# Patient Record
Sex: Female | Born: 1955
Health system: Southern US, Community
[De-identification: ages and names within clinical notes are randomized; demographics above are authoritative.]

## PROBLEM LIST (undated history)

## (undated) DIAGNOSIS — E119 Type 2 diabetes mellitus without complications: Secondary | ICD-10-CM

## (undated) DIAGNOSIS — D631 Anemia in chronic kidney disease: Secondary | ICD-10-CM

## (undated) DIAGNOSIS — I10 Essential (primary) hypertension: Secondary | ICD-10-CM

## (undated) DIAGNOSIS — N189 Chronic kidney disease, unspecified: Secondary | ICD-10-CM

## (undated) DIAGNOSIS — E1140 Type 2 diabetes mellitus with diabetic neuropathy, unspecified: Secondary | ICD-10-CM

## (undated) DIAGNOSIS — G9340 Encephalopathy, unspecified: Secondary | ICD-10-CM

## (undated) DIAGNOSIS — R569 Unspecified convulsions: Secondary | ICD-10-CM

## (undated) DIAGNOSIS — F39 Unspecified mood [affective] disorder: Secondary | ICD-10-CM

## (undated) DIAGNOSIS — J189 Pneumonia, unspecified organism: Secondary | ICD-10-CM

## (undated) DIAGNOSIS — E785 Hyperlipidemia, unspecified: Secondary | ICD-10-CM

## (undated) DIAGNOSIS — N184 Chronic kidney disease, stage 4 (severe): Secondary | ICD-10-CM

## (undated) HISTORY — PX: ABDOMINAL HYSTERECTOMY: SHX81

## (undated) HISTORY — DX: Chronic kidney disease, unspecified: N18.9

## (undated) HISTORY — PX: TUBAL LIGATION: SHX77

## (undated) HISTORY — DX: Encephalopathy, unspecified: G93.40

## (undated) HISTORY — PX: EYE SURGERY: SHX253

---

## 2011-11-21 DIAGNOSIS — R799 Abnormal finding of blood chemistry, unspecified: Secondary | ICD-10-CM | POA: Diagnosis not present

## 2011-11-21 DIAGNOSIS — D649 Anemia, unspecified: Secondary | ICD-10-CM | POA: Diagnosis not present

## 2011-11-21 DIAGNOSIS — R7402 Elevation of levels of lactic acid dehydrogenase (LDH): Secondary | ICD-10-CM | POA: Diagnosis not present

## 2011-11-21 DIAGNOSIS — R7401 Elevation of levels of liver transaminase levels: Secondary | ICD-10-CM | POA: Diagnosis not present

## 2011-11-21 DIAGNOSIS — R748 Abnormal levels of other serum enzymes: Secondary | ICD-10-CM | POA: Diagnosis not present

## 2011-11-26 DIAGNOSIS — Z23 Encounter for immunization: Secondary | ICD-10-CM | POA: Diagnosis not present

## 2011-12-25 DIAGNOSIS — E109 Type 1 diabetes mellitus without complications: Secondary | ICD-10-CM | POA: Diagnosis not present

## 2012-01-16 DIAGNOSIS — R748 Abnormal levels of other serum enzymes: Secondary | ICD-10-CM | POA: Diagnosis not present

## 2012-01-16 DIAGNOSIS — D649 Anemia, unspecified: Secondary | ICD-10-CM | POA: Diagnosis not present

## 2012-01-16 DIAGNOSIS — E8809 Other disorders of plasma-protein metabolism, not elsewhere classified: Secondary | ICD-10-CM | POA: Diagnosis not present

## 2012-01-16 DIAGNOSIS — I10 Essential (primary) hypertension: Secondary | ICD-10-CM | POA: Diagnosis not present

## 2012-01-21 DIAGNOSIS — E7800 Pure hypercholesterolemia, unspecified: Secondary | ICD-10-CM | POA: Diagnosis not present

## 2012-01-21 DIAGNOSIS — E569 Vitamin deficiency, unspecified: Secondary | ICD-10-CM | POA: Diagnosis not present

## 2012-01-21 DIAGNOSIS — IMO0001 Reserved for inherently not codable concepts without codable children: Secondary | ICD-10-CM | POA: Diagnosis not present

## 2012-01-21 DIAGNOSIS — R300 Dysuria: Secondary | ICD-10-CM | POA: Diagnosis not present

## 2012-01-21 DIAGNOSIS — I10 Essential (primary) hypertension: Secondary | ICD-10-CM | POA: Diagnosis not present

## 2012-01-27 DIAGNOSIS — E7800 Pure hypercholesterolemia, unspecified: Secondary | ICD-10-CM | POA: Diagnosis not present

## 2012-01-27 DIAGNOSIS — I10 Essential (primary) hypertension: Secondary | ICD-10-CM | POA: Diagnosis not present

## 2012-01-27 DIAGNOSIS — E569 Vitamin deficiency, unspecified: Secondary | ICD-10-CM | POA: Diagnosis not present

## 2012-01-27 DIAGNOSIS — IMO0001 Reserved for inherently not codable concepts without codable children: Secondary | ICD-10-CM | POA: Diagnosis not present

## 2012-01-27 DIAGNOSIS — M889 Osteitis deformans of unspecified bone: Secondary | ICD-10-CM | POA: Diagnosis not present

## 2012-01-27 DIAGNOSIS — M899 Disorder of bone, unspecified: Secondary | ICD-10-CM | POA: Diagnosis not present

## 2012-01-27 DIAGNOSIS — M949 Disorder of cartilage, unspecified: Secondary | ICD-10-CM | POA: Diagnosis not present

## 2012-02-17 DIAGNOSIS — IMO0001 Reserved for inherently not codable concepts without codable children: Secondary | ICD-10-CM | POA: Diagnosis not present

## 2012-02-17 DIAGNOSIS — I10 Essential (primary) hypertension: Secondary | ICD-10-CM | POA: Diagnosis not present

## 2012-02-17 DIAGNOSIS — E7800 Pure hypercholesterolemia, unspecified: Secondary | ICD-10-CM | POA: Diagnosis not present

## 2012-02-17 DIAGNOSIS — E569 Vitamin deficiency, unspecified: Secondary | ICD-10-CM | POA: Diagnosis not present

## 2012-03-01 DIAGNOSIS — D7589 Other specified diseases of blood and blood-forming organs: Secondary | ICD-10-CM | POA: Diagnosis not present

## 2012-03-01 DIAGNOSIS — D472 Monoclonal gammopathy: Secondary | ICD-10-CM | POA: Diagnosis not present

## 2012-05-06 DIAGNOSIS — D7589 Other specified diseases of blood and blood-forming organs: Secondary | ICD-10-CM | POA: Diagnosis not present

## 2012-08-31 DIAGNOSIS — R0602 Shortness of breath: Secondary | ICD-10-CM | POA: Diagnosis not present

## 2012-08-31 DIAGNOSIS — I10 Essential (primary) hypertension: Secondary | ICD-10-CM | POA: Diagnosis not present

## 2012-08-31 DIAGNOSIS — E109 Type 1 diabetes mellitus without complications: Secondary | ICD-10-CM | POA: Diagnosis not present

## 2012-11-23 DIAGNOSIS — D7589 Other specified diseases of blood and blood-forming organs: Secondary | ICD-10-CM | POA: Diagnosis not present

## 2012-11-23 DIAGNOSIS — D649 Anemia, unspecified: Secondary | ICD-10-CM | POA: Diagnosis not present

## 2012-11-26 DIAGNOSIS — I10 Essential (primary) hypertension: Secondary | ICD-10-CM | POA: Diagnosis not present

## 2012-11-26 DIAGNOSIS — I059 Rheumatic mitral valve disease, unspecified: Secondary | ICD-10-CM | POA: Diagnosis not present

## 2012-11-26 DIAGNOSIS — I379 Nonrheumatic pulmonary valve disorder, unspecified: Secondary | ICD-10-CM | POA: Diagnosis not present

## 2012-11-26 DIAGNOSIS — R079 Chest pain, unspecified: Secondary | ICD-10-CM | POA: Diagnosis not present

## 2012-11-26 DIAGNOSIS — R002 Palpitations: Secondary | ICD-10-CM | POA: Diagnosis not present

## 2012-11-26 DIAGNOSIS — E7800 Pure hypercholesterolemia, unspecified: Secondary | ICD-10-CM | POA: Diagnosis not present

## 2012-11-26 DIAGNOSIS — I369 Nonrheumatic tricuspid valve disorder, unspecified: Secondary | ICD-10-CM | POA: Diagnosis not present

## 2013-03-04 DIAGNOSIS — Z794 Long term (current) use of insulin: Secondary | ICD-10-CM | POA: Diagnosis not present

## 2013-03-04 DIAGNOSIS — E1069 Type 1 diabetes mellitus with other specified complication: Secondary | ICD-10-CM | POA: Diagnosis not present

## 2013-03-04 DIAGNOSIS — I10 Essential (primary) hypertension: Secondary | ICD-10-CM | POA: Diagnosis not present

## 2013-03-04 DIAGNOSIS — R5383 Other fatigue: Secondary | ICD-10-CM | POA: Diagnosis not present

## 2013-03-04 DIAGNOSIS — R4182 Altered mental status, unspecified: Secondary | ICD-10-CM | POA: Diagnosis not present

## 2013-03-04 DIAGNOSIS — R5381 Other malaise: Secondary | ICD-10-CM | POA: Diagnosis not present

## 2013-03-10 DIAGNOSIS — R51 Headache: Secondary | ICD-10-CM | POA: Diagnosis not present

## 2013-04-13 DIAGNOSIS — E1139 Type 2 diabetes mellitus with other diabetic ophthalmic complication: Secondary | ICD-10-CM | POA: Diagnosis not present

## 2013-04-13 DIAGNOSIS — H25019 Cortical age-related cataract, unspecified eye: Secondary | ICD-10-CM | POA: Diagnosis not present

## 2013-04-15 DIAGNOSIS — E109 Type 1 diabetes mellitus without complications: Secondary | ICD-10-CM | POA: Diagnosis not present

## 2013-04-15 DIAGNOSIS — IMO0001 Reserved for inherently not codable concepts without codable children: Secondary | ICD-10-CM | POA: Diagnosis not present

## 2013-04-15 DIAGNOSIS — B9789 Other viral agents as the cause of diseases classified elsewhere: Secondary | ICD-10-CM | POA: Diagnosis not present

## 2013-04-15 DIAGNOSIS — R509 Fever, unspecified: Secondary | ICD-10-CM | POA: Diagnosis not present

## 2013-04-15 DIAGNOSIS — R6883 Chills (without fever): Secondary | ICD-10-CM | POA: Diagnosis not present

## 2013-05-10 DIAGNOSIS — K5900 Constipation, unspecified: Secondary | ICD-10-CM | POA: Diagnosis not present

## 2013-05-10 DIAGNOSIS — I10 Essential (primary) hypertension: Secondary | ICD-10-CM | POA: Diagnosis not present

## 2013-05-10 DIAGNOSIS — E559 Vitamin D deficiency, unspecified: Secondary | ICD-10-CM | POA: Diagnosis not present

## 2013-05-10 DIAGNOSIS — E119 Type 2 diabetes mellitus without complications: Secondary | ICD-10-CM | POA: Diagnosis not present

## 2013-05-11 DIAGNOSIS — E559 Vitamin D deficiency, unspecified: Secondary | ICD-10-CM | POA: Diagnosis not present

## 2013-05-11 DIAGNOSIS — E119 Type 2 diabetes mellitus without complications: Secondary | ICD-10-CM | POA: Diagnosis not present

## 2013-05-11 DIAGNOSIS — I10 Essential (primary) hypertension: Secondary | ICD-10-CM | POA: Diagnosis not present

## 2013-05-11 DIAGNOSIS — E785 Hyperlipidemia, unspecified: Secondary | ICD-10-CM | POA: Diagnosis not present

## 2013-05-25 DIAGNOSIS — E109 Type 1 diabetes mellitus without complications: Secondary | ICD-10-CM | POA: Diagnosis not present

## 2013-05-25 DIAGNOSIS — H0520 Unspecified exophthalmos: Secondary | ICD-10-CM | POA: Diagnosis not present

## 2013-05-25 DIAGNOSIS — H25049 Posterior subcapsular polar age-related cataract, unspecified eye: Secondary | ICD-10-CM | POA: Diagnosis not present

## 2013-05-25 DIAGNOSIS — H2510 Age-related nuclear cataract, unspecified eye: Secondary | ICD-10-CM | POA: Diagnosis not present

## 2013-05-31 DIAGNOSIS — M889 Osteitis deformans of unspecified bone: Secondary | ICD-10-CM | POA: Diagnosis not present

## 2013-06-02 DIAGNOSIS — E559 Vitamin D deficiency, unspecified: Secondary | ICD-10-CM | POA: Diagnosis not present

## 2013-06-02 DIAGNOSIS — I10 Essential (primary) hypertension: Secondary | ICD-10-CM | POA: Diagnosis not present

## 2013-06-02 DIAGNOSIS — E7800 Pure hypercholesterolemia, unspecified: Secondary | ICD-10-CM | POA: Diagnosis not present

## 2013-06-02 DIAGNOSIS — E119 Type 2 diabetes mellitus without complications: Secondary | ICD-10-CM | POA: Diagnosis not present

## 2013-06-14 DIAGNOSIS — I10 Essential (primary) hypertension: Secondary | ICD-10-CM | POA: Diagnosis not present

## 2013-06-14 DIAGNOSIS — Z01811 Encounter for preprocedural respiratory examination: Secondary | ICD-10-CM | POA: Diagnosis not present

## 2013-06-14 DIAGNOSIS — H0520 Unspecified exophthalmos: Secondary | ICD-10-CM | POA: Diagnosis not present

## 2013-06-14 DIAGNOSIS — E119 Type 2 diabetes mellitus without complications: Secondary | ICD-10-CM | POA: Diagnosis not present

## 2013-06-14 DIAGNOSIS — E109 Type 1 diabetes mellitus without complications: Secondary | ICD-10-CM | POA: Diagnosis not present

## 2013-06-14 DIAGNOSIS — H2589 Other age-related cataract: Secondary | ICD-10-CM | POA: Diagnosis not present

## 2013-06-14 DIAGNOSIS — H2510 Age-related nuclear cataract, unspecified eye: Secondary | ICD-10-CM | POA: Diagnosis not present

## 2013-06-14 DIAGNOSIS — H25049 Posterior subcapsular polar age-related cataract, unspecified eye: Secondary | ICD-10-CM | POA: Diagnosis not present

## 2013-06-15 DIAGNOSIS — R6883 Chills (without fever): Secondary | ICD-10-CM | POA: Diagnosis not present

## 2013-06-15 DIAGNOSIS — E162 Hypoglycemia, unspecified: Secondary | ICD-10-CM | POA: Diagnosis not present

## 2013-06-15 DIAGNOSIS — E1069 Type 1 diabetes mellitus with other specified complication: Secondary | ICD-10-CM | POA: Diagnosis not present

## 2013-06-21 DIAGNOSIS — H2589 Other age-related cataract: Secondary | ICD-10-CM | POA: Diagnosis not present

## 2013-06-21 DIAGNOSIS — E119 Type 2 diabetes mellitus without complications: Secondary | ICD-10-CM | POA: Diagnosis not present

## 2013-06-21 DIAGNOSIS — H2510 Age-related nuclear cataract, unspecified eye: Secondary | ICD-10-CM | POA: Diagnosis not present

## 2013-06-21 DIAGNOSIS — I10 Essential (primary) hypertension: Secondary | ICD-10-CM | POA: Diagnosis not present

## 2013-07-12 DIAGNOSIS — I10 Essential (primary) hypertension: Secondary | ICD-10-CM | POA: Diagnosis not present

## 2013-07-12 DIAGNOSIS — H2589 Other age-related cataract: Secondary | ICD-10-CM | POA: Diagnosis not present

## 2013-07-12 DIAGNOSIS — E119 Type 2 diabetes mellitus without complications: Secondary | ICD-10-CM | POA: Diagnosis not present

## 2013-08-30 DIAGNOSIS — R109 Unspecified abdominal pain: Secondary | ICD-10-CM | POA: Diagnosis not present

## 2013-08-30 DIAGNOSIS — E1010 Type 1 diabetes mellitus with ketoacidosis without coma: Secondary | ICD-10-CM | POA: Diagnosis not present

## 2013-08-30 DIAGNOSIS — R630 Anorexia: Secondary | ICD-10-CM | POA: Diagnosis not present

## 2013-08-30 DIAGNOSIS — Z91199 Patient's noncompliance with other medical treatment and regimen due to unspecified reason: Secondary | ICD-10-CM | POA: Diagnosis not present

## 2013-08-30 DIAGNOSIS — K2940 Chronic atrophic gastritis without bleeding: Secondary | ICD-10-CM | POA: Diagnosis present

## 2013-08-30 DIAGNOSIS — E875 Hyperkalemia: Secondary | ICD-10-CM | POA: Diagnosis not present

## 2013-08-30 DIAGNOSIS — E785 Hyperlipidemia, unspecified: Secondary | ICD-10-CM | POA: Diagnosis not present

## 2013-08-30 DIAGNOSIS — R944 Abnormal results of kidney function studies: Secondary | ICD-10-CM | POA: Diagnosis not present

## 2013-08-30 DIAGNOSIS — R1084 Generalized abdominal pain: Secondary | ICD-10-CM | POA: Diagnosis not present

## 2013-08-30 DIAGNOSIS — R748 Abnormal levels of other serum enzymes: Secondary | ICD-10-CM | POA: Diagnosis present

## 2013-08-30 DIAGNOSIS — M889 Osteitis deformans of unspecified bone: Secondary | ICD-10-CM | POA: Diagnosis present

## 2013-08-30 DIAGNOSIS — K5900 Constipation, unspecified: Secondary | ICD-10-CM | POA: Diagnosis not present

## 2013-08-30 DIAGNOSIS — R0609 Other forms of dyspnea: Secondary | ICD-10-CM | POA: Diagnosis not present

## 2013-08-30 DIAGNOSIS — E860 Dehydration: Secondary | ICD-10-CM | POA: Diagnosis not present

## 2013-08-30 DIAGNOSIS — Z9119 Patient's noncompliance with other medical treatment and regimen: Secondary | ICD-10-CM | POA: Diagnosis not present

## 2013-08-30 DIAGNOSIS — I10 Essential (primary) hypertension: Secondary | ICD-10-CM | POA: Diagnosis not present

## 2013-08-30 DIAGNOSIS — K219 Gastro-esophageal reflux disease without esophagitis: Secondary | ICD-10-CM | POA: Diagnosis not present

## 2013-09-06 DIAGNOSIS — Z23 Encounter for immunization: Secondary | ICD-10-CM | POA: Diagnosis not present

## 2013-09-06 DIAGNOSIS — I10 Essential (primary) hypertension: Secondary | ICD-10-CM | POA: Diagnosis not present

## 2013-09-06 DIAGNOSIS — E7800 Pure hypercholesterolemia, unspecified: Secondary | ICD-10-CM | POA: Diagnosis not present

## 2013-09-06 DIAGNOSIS — E119 Type 2 diabetes mellitus without complications: Secondary | ICD-10-CM | POA: Diagnosis not present

## 2013-09-06 DIAGNOSIS — E559 Vitamin D deficiency, unspecified: Secondary | ICD-10-CM | POA: Diagnosis not present

## 2013-09-15 DIAGNOSIS — G589 Mononeuropathy, unspecified: Secondary | ICD-10-CM | POA: Diagnosis not present

## 2013-10-18 DIAGNOSIS — E7800 Pure hypercholesterolemia, unspecified: Secondary | ICD-10-CM | POA: Diagnosis not present

## 2013-10-18 DIAGNOSIS — I10 Essential (primary) hypertension: Secondary | ICD-10-CM | POA: Diagnosis not present

## 2013-10-18 DIAGNOSIS — G589 Mononeuropathy, unspecified: Secondary | ICD-10-CM | POA: Diagnosis not present

## 2013-10-18 DIAGNOSIS — E119 Type 2 diabetes mellitus without complications: Secondary | ICD-10-CM | POA: Diagnosis not present

## 2014-01-06 DIAGNOSIS — R0602 Shortness of breath: Secondary | ICD-10-CM | POA: Diagnosis not present

## 2014-01-06 DIAGNOSIS — I10 Essential (primary) hypertension: Secondary | ICD-10-CM | POA: Diagnosis not present

## 2014-01-06 DIAGNOSIS — Z794 Long term (current) use of insulin: Secondary | ICD-10-CM | POA: Diagnosis not present

## 2014-01-06 DIAGNOSIS — E109 Type 1 diabetes mellitus without complications: Secondary | ICD-10-CM | POA: Diagnosis not present

## 2014-01-06 DIAGNOSIS — R110 Nausea: Secondary | ICD-10-CM | POA: Diagnosis not present

## 2014-01-06 DIAGNOSIS — R350 Frequency of micturition: Secondary | ICD-10-CM | POA: Diagnosis not present

## 2014-01-10 DIAGNOSIS — E119 Type 2 diabetes mellitus without complications: Secondary | ICD-10-CM | POA: Diagnosis not present

## 2014-01-16 DIAGNOSIS — G589 Mononeuropathy, unspecified: Secondary | ICD-10-CM | POA: Diagnosis not present

## 2014-01-16 DIAGNOSIS — E119 Type 2 diabetes mellitus without complications: Secondary | ICD-10-CM | POA: Diagnosis not present

## 2014-01-16 DIAGNOSIS — E7800 Pure hypercholesterolemia, unspecified: Secondary | ICD-10-CM | POA: Diagnosis not present

## 2014-01-16 DIAGNOSIS — I10 Essential (primary) hypertension: Secondary | ICD-10-CM | POA: Diagnosis not present

## 2014-03-04 DIAGNOSIS — I10 Essential (primary) hypertension: Secondary | ICD-10-CM | POA: Diagnosis not present

## 2014-03-04 DIAGNOSIS — M5430 Sciatica, unspecified side: Secondary | ICD-10-CM | POA: Diagnosis not present

## 2014-03-04 DIAGNOSIS — M5380 Other specified dorsopathies, site unspecified: Secondary | ICD-10-CM | POA: Diagnosis not present

## 2014-03-04 DIAGNOSIS — E109 Type 1 diabetes mellitus without complications: Secondary | ICD-10-CM | POA: Diagnosis not present

## 2014-04-18 DIAGNOSIS — E119 Type 2 diabetes mellitus without complications: Secondary | ICD-10-CM | POA: Diagnosis not present

## 2014-04-25 DIAGNOSIS — E7800 Pure hypercholesterolemia, unspecified: Secondary | ICD-10-CM | POA: Diagnosis not present

## 2014-04-25 DIAGNOSIS — Z79899 Other long term (current) drug therapy: Secondary | ICD-10-CM | POA: Diagnosis not present

## 2014-04-25 DIAGNOSIS — I10 Essential (primary) hypertension: Secondary | ICD-10-CM | POA: Diagnosis not present

## 2014-04-25 DIAGNOSIS — E119 Type 2 diabetes mellitus without complications: Secondary | ICD-10-CM | POA: Diagnosis not present

## 2014-04-25 DIAGNOSIS — G589 Mononeuropathy, unspecified: Secondary | ICD-10-CM | POA: Diagnosis not present

## 2014-04-26 DIAGNOSIS — E119 Type 2 diabetes mellitus without complications: Secondary | ICD-10-CM | POA: Diagnosis not present

## 2014-05-31 DIAGNOSIS — R3589 Other polyuria: Secondary | ICD-10-CM | POA: Diagnosis not present

## 2014-05-31 DIAGNOSIS — R358 Other polyuria: Secondary | ICD-10-CM | POA: Diagnosis not present

## 2014-05-31 DIAGNOSIS — R631 Polydipsia: Secondary | ICD-10-CM | POA: Diagnosis not present

## 2014-05-31 DIAGNOSIS — I10 Essential (primary) hypertension: Secondary | ICD-10-CM | POA: Diagnosis not present

## 2014-05-31 DIAGNOSIS — E119 Type 2 diabetes mellitus without complications: Secondary | ICD-10-CM | POA: Diagnosis not present

## 2014-06-12 DIAGNOSIS — D472 Monoclonal gammopathy: Secondary | ICD-10-CM | POA: Diagnosis not present

## 2014-06-12 DIAGNOSIS — D649 Anemia, unspecified: Secondary | ICD-10-CM | POA: Diagnosis not present

## 2014-10-06 DIAGNOSIS — Z23 Encounter for immunization: Secondary | ICD-10-CM | POA: Diagnosis not present

## 2014-10-06 DIAGNOSIS — F324 Major depressive disorder, single episode, in partial remission: Secondary | ICD-10-CM | POA: Diagnosis not present

## 2014-10-06 DIAGNOSIS — E559 Vitamin D deficiency, unspecified: Secondary | ICD-10-CM | POA: Diagnosis not present

## 2014-10-06 DIAGNOSIS — Z79899 Other long term (current) drug therapy: Secondary | ICD-10-CM | POA: Diagnosis not present

## 2014-10-06 DIAGNOSIS — E1140 Type 2 diabetes mellitus with diabetic neuropathy, unspecified: Secondary | ICD-10-CM | POA: Diagnosis not present

## 2014-10-06 DIAGNOSIS — I10 Essential (primary) hypertension: Secondary | ICD-10-CM | POA: Diagnosis not present

## 2014-10-06 DIAGNOSIS — E1142 Type 2 diabetes mellitus with diabetic polyneuropathy: Secondary | ICD-10-CM | POA: Diagnosis not present

## 2014-10-17 ENCOUNTER — Encounter (HOSPITAL_COMMUNITY): Payer: Self-pay | Admitting: Emergency Medicine

## 2014-10-17 ENCOUNTER — Emergency Department (HOSPITAL_COMMUNITY): Payer: Medicare Other

## 2014-10-17 ENCOUNTER — Emergency Department (HOSPITAL_COMMUNITY)
Admission: EM | Admit: 2014-10-17 | Discharge: 2014-10-18 | Disposition: A | Discharge status: Home / Self Care | Payer: Medicare Other | Attending: Emergency Medicine | Admitting: Emergency Medicine

## 2014-10-17 DIAGNOSIS — K2970 Gastritis, unspecified, without bleeding: Secondary | ICD-10-CM | POA: Insufficient documentation

## 2014-10-17 DIAGNOSIS — R1031 Right lower quadrant pain: Secondary | ICD-10-CM

## 2014-10-17 DIAGNOSIS — I10 Essential (primary) hypertension: Secondary | ICD-10-CM | POA: Diagnosis not present

## 2014-10-17 DIAGNOSIS — Z87891 Personal history of nicotine dependence: Secondary | ICD-10-CM | POA: Insufficient documentation

## 2014-10-17 DIAGNOSIS — E119 Type 2 diabetes mellitus without complications: Secondary | ICD-10-CM | POA: Insufficient documentation

## 2014-10-17 DIAGNOSIS — K2900 Acute gastritis without bleeding: Secondary | ICD-10-CM | POA: Diagnosis not present

## 2014-10-17 DIAGNOSIS — R110 Nausea: Secondary | ICD-10-CM | POA: Diagnosis not present

## 2014-10-17 DIAGNOSIS — R000 Tachycardia, unspecified: Secondary | ICD-10-CM | POA: Insufficient documentation

## 2014-10-17 HISTORY — DX: Essential (primary) hypertension: I10

## 2014-10-17 HISTORY — DX: Type 2 diabetes mellitus without complications: E11.9

## 2014-10-17 LAB — COMPREHENSIVE METABOLIC PANEL
ALT: 10 U/L (ref 0–35)
AST: 21 U/L (ref 0–37)
Albumin: 4.5 g/dL (ref 3.5–5.2)
Alkaline Phosphatase: 197 U/L — ABNORMAL HIGH (ref 39–117)
Anion gap: 19 — ABNORMAL HIGH (ref 5–15)
BUN: 14 mg/dL (ref 6–23)
CO2: 22 mEq/L (ref 19–32)
Calcium: 10.5 mg/dL (ref 8.4–10.5)
Chloride: 99 mEq/L (ref 96–112)
Creatinine, Ser: 0.88 mg/dL (ref 0.50–1.10)
GFR calc Af Amer: 82 mL/min — ABNORMAL LOW (ref >90)
GFR calc non Af Amer: 71 mL/min — ABNORMAL LOW (ref >90)
Glucose, Bld: 373 mg/dL — ABNORMAL HIGH (ref 70–99)
Potassium: 4.4 mEq/L (ref 3.7–5.3)
Sodium: 140 mEq/L (ref 137–147)
Total Bilirubin: 0.3 mg/dL (ref 0.3–1.2)
Total Protein: 9.4 g/dL — ABNORMAL HIGH (ref 6.0–8.3)

## 2014-10-17 LAB — URINALYSIS, ROUTINE W REFLEX MICROSCOPIC
Bilirubin Urine: NEGATIVE
Glucose, UA: >1000 mg/dL — AB
Hgb urine dipstick: NEGATIVE
Ketones, ur: 15 mg/dL — AB
Leukocytes, UA: NEGATIVE
Nitrite: NEGATIVE
Protein, ur: NEGATIVE mg/dL
Specific Gravity, Urine: 1.038 — ABNORMAL HIGH (ref 1.005–1.030)
Urobilinogen, UA: 0.2 mg/dL (ref 0.0–1.0)
pH: 5 (ref 5.0–8.0)

## 2014-10-17 LAB — CBC WITH DIFFERENTIAL/PLATELET
Basophils Absolute: 0 10*3/uL (ref 0.0–0.1)
Basophils Relative: 0 % (ref 0–1)
Eosinophils Absolute: 0 10*3/uL (ref 0.0–0.7)
Eosinophils Relative: 0 % (ref 0–5)
HCT: 36.6 % (ref 36.0–46.0)
Hemoglobin: 12.8 g/dL (ref 12.0–15.0)
Lymphocytes Relative: 46 % (ref 12–46)
Lymphs Abs: 2.6 10*3/uL (ref 0.7–4.0)
MCH: 29.1 pg (ref 26.0–34.0)
MCHC: 35 g/dL (ref 30.0–36.0)
MCV: 83.2 fL (ref 78.0–100.0)
Monocytes Absolute: 0.4 10*3/uL (ref 0.1–1.0)
Monocytes Relative: 7 % (ref 3–12)
Neutro Abs: 2.7 10*3/uL (ref 1.7–7.7)
Neutrophils Relative %: 47 % (ref 43–77)
Platelets: 299 10*3/uL (ref 150–400)
RBC: 4.4 MIL/uL (ref 3.87–5.11)
RDW: 11.6 % (ref 11.5–15.5)
WBC: 5.7 10*3/uL (ref 4.0–10.5)

## 2014-10-17 LAB — URINE MICROSCOPIC-ADD ON

## 2014-10-17 LAB — CBG MONITORING, ED: Glucose-Capillary: 377 mg/dL — ABNORMAL HIGH (ref 70–99)

## 2014-10-17 LAB — LIPASE, BLOOD: Lipase: 56 U/L (ref 11–59)

## 2014-10-17 MED ORDER — IOHEXOL 300 MG/ML  SOLN: 100.0000 mL | Freq: Once | INTRAMUSCULAR | Status: AC | PRN

## 2014-10-17 MED ORDER — SODIUM CHLORIDE 0.9 % IV BOLUS (SEPSIS)
1000.0000 mL | Freq: Once | INTRAVENOUS | Status: AC
  Administered 2014-10-17: 1000 mL via INTRAVENOUS

## 2014-10-17 MED ORDER — ONDANSETRON HCL 4 MG/2ML IJ SOLN
4.0000 mg | Freq: Once | INTRAMUSCULAR | Status: AC
  Administered 2014-10-17: 4 mg via INTRAVENOUS
  Filled 2014-10-17: qty 2

## 2014-10-17 MED ORDER — GI COCKTAIL ~~LOC~~
30.0000 mL | Freq: Once | ORAL | Status: AC
  Administered 2014-10-17: 30 mL via ORAL
  Filled 2014-10-17: qty 30

## 2014-10-17 NOTE — ED Notes (Signed)
Patient states she has mid abd pain constantly but the intensity changes about 30 min after eating. Pain described as aching, pressure.

## 2014-10-17 NOTE — ED Notes (Signed)
Pt arrived to the Ed with a complaint of abdominal pain.  Pt states she has sharp pains after eating.  Pt states this condition has been going on for a year.  Pt states her last bowel movement was last Thursday.

## 2014-10-17 NOTE — ED Provider Notes (Signed)
CSN: [id number]     Arrival date & time 10/17/14  1913 History   First MD Initiated Contact with Patient 10/17/14 2152     Chief Complaint  Patient presents with  . Abdominal Pain     (Consider location/radiation/quality/duration/timing/severity/associated sxs/prior Treatment) Patient is a 58 y.o. female presenting with abdominal pain. The history is provided by the patient.  Abdominal Pain Pain location:  Periumbilical and RLQ Pain quality: aching, cramping and gnawing   Pain radiates to:  Does not radiate Pain severity:  Moderate Onset quality:  Gradual Duration:  3 days Timing:  Constant Progression:  Worsening Chronicity:  New Context: eating   Context: not medication withdrawal and not sick contacts   Relieved by:  Nothing Worsened by:  Eating Ineffective treatments:  None tried Associated symptoms: anorexia, constipation, flatus and nausea   Associated symptoms: no chest pain, no diarrhea, no dysuria, no fever, no vaginal bleeding, no vaginal discharge and no vomiting   Associated symptoms comment:  Last BM last thursday Risk factors: no alcohol abuse, no aspirin use, has not had multiple surgeries, no NSAID use and no recent hospitalization     Past Medical History  Diagnosis Date  . Diabetes mellitus without complication   . Hypertension    History reviewed. No pertinent past surgical history. History reviewed. No pertinent family history. History  Substance Use Topics  . Smoking status: Former Research scientist (life sciences)  . Smokeless tobacco: Never Used  . Alcohol Use: No   OB History    No data available     Review of Systems  Constitutional: Negative for fever.  Cardiovascular: Negative for chest pain.  Gastrointestinal: Positive for nausea, abdominal pain, constipation, anorexia and flatus. Negative for vomiting and diarrhea.  Genitourinary: Negative for dysuria, vaginal bleeding and vaginal discharge.  All other systems reviewed and are negative.     Allergies   Review of patient's allergies indicates not on file.  Home Medications   Prior to Admission medications   Not on File   BP 187/121 mmHg  Pulse 117  Temp(Src) 98.3 F (36.8 C) (Oral)  Resp 20  SpO2 98% Physical Exam  Constitutional: She is oriented to person, place, and time. She appears well-developed and well-nourished. She appears distressed.  HENT:  Head: Normocephalic and atraumatic.  Eyes: EOM are normal. Pupils are equal, round, and reactive to light.  Cardiovascular: Regular rhythm, normal heart sounds and intact distal pulses.  Tachycardia present.  Exam reveals no friction rub.   No murmur heard. Pulmonary/Chest: Effort normal and breath sounds normal. She has no wheezes. She has no rales.  Abdominal: Soft. Bowel sounds are normal. She exhibits no distension. There is tenderness in the right lower quadrant and periumbilical area. There is guarding. There is no rebound.  Musculoskeletal: Normal range of motion. She exhibits no tenderness.  No edema  Neurological: She is alert and oriented to person, place, and time. No cranial nerve deficit.  Skin: Skin is warm and dry. No rash noted.  Psychiatric: She has a normal mood and affect. Her behavior is normal.  Nursing note and vitals reviewed.   ED Course  Procedures (including critical care time) Labs Review Labs Reviewed  COMPREHENSIVE METABOLIC PANEL - Abnormal; Notable for the following:    Glucose, Bld 373 (*)    Total Protein 9.4 (*)    Alkaline Phosphatase 197 (*)    GFR calc non Af Amer 71 (*)    GFR calc Af Amer 82 (*)    Anion gap  19 (*)    All other components within normal limits  URINALYSIS, ROUTINE W REFLEX MICROSCOPIC - Abnormal; Notable for the following:    Specific Gravity, Urine 1.038 (*)    Glucose, UA >1000 (*)    Ketones, ur 15 (*)    All other components within normal limits  URINE MICROSCOPIC-ADD ON - Abnormal; Notable for the following:    Squamous Epithelial / LPF FEW (*)    Bacteria,  UA FEW (*)    All other components within normal limits  CBG MONITORING, ED - Abnormal; Notable for the following:    Glucose-Capillary 377 (*)    All other components within normal limits  CBC WITH DIFFERENTIAL  LIPASE, BLOOD    Imaging Review No results found.   EKG Interpretation None      MDM   Final diagnoses:  RLQ abdominal pain    Patient with worsening abdominal pain over the last 3 days. If symptoms are worse with eating she initially points to. Umbilical area however on exam she has significant right lower quadrant tenderness with guarding as well. Patient denies any alcohol, NSAID use.  Patient does not have a history of gastric ulcers but states her diabetes is poorly controlled and she has had a history of gastroparesis in the past.  Patient denies any vomiting but has had nausea. No diarrhea or urinary symptoms. She currently started new medications approximately 2 weeks ago and has been taking them regularly.  CBC, CMP, lipase, UA, abdominal and pelvis CT pending. Patient given IV fluids. Concern for possible peptic ulcer disease versus gastroparesis versus appendicitis versus pancreatitis. Low suspicion for cholecystitis or diverticulitis.  12:04 AM Labs wnl other than hyperglycemia.  Pt improved after GI cocktail.  CT pending.  Checked out to Dr. Maryellen Pile, MD 10/18/14 [phone]

## 2014-10-18 DIAGNOSIS — R110 Nausea: Secondary | ICD-10-CM | POA: Diagnosis not present

## 2014-10-18 DIAGNOSIS — R1031 Right lower quadrant pain: Secondary | ICD-10-CM | POA: Diagnosis not present

## 2014-10-18 MED ORDER — RANITIDINE HCL 150 MG PO TABS: 150.0000 mg | ORAL_TABLET | Freq: Two times a day (BID) | ORAL | Status: DC

## 2014-10-18 MED ORDER — SUCRALFATE 1 G PO TABS: 1.0000 g | ORAL_TABLET | Freq: Three times a day (TID) | ORAL | Status: DC

## 2014-10-18 MED ORDER — IOHEXOL 300 MG/ML  SOLN
100.0000 mL | Freq: Once | INTRAMUSCULAR | Status: AC | PRN
  Administered 2014-10-18: 100 mL via INTRAVENOUS

## 2014-10-26 DIAGNOSIS — K2970 Gastritis, unspecified, without bleeding: Secondary | ICD-10-CM | POA: Diagnosis not present

## 2014-10-26 DIAGNOSIS — I10 Essential (primary) hypertension: Secondary | ICD-10-CM | POA: Diagnosis not present

## 2015-01-30 DIAGNOSIS — E1140 Type 2 diabetes mellitus with diabetic neuropathy, unspecified: Secondary | ICD-10-CM | POA: Diagnosis not present

## 2015-01-30 DIAGNOSIS — E1165 Type 2 diabetes mellitus with hyperglycemia: Secondary | ICD-10-CM | POA: Diagnosis not present

## 2015-01-30 DIAGNOSIS — Z1211 Encounter for screening for malignant neoplasm of colon: Secondary | ICD-10-CM | POA: Diagnosis not present

## 2015-01-30 DIAGNOSIS — I10 Essential (primary) hypertension: Secondary | ICD-10-CM | POA: Diagnosis not present

## 2015-01-30 DIAGNOSIS — M25569 Pain in unspecified knee: Secondary | ICD-10-CM | POA: Diagnosis not present

## 2015-01-30 DIAGNOSIS — K649 Unspecified hemorrhoids: Secondary | ICD-10-CM | POA: Diagnosis not present

## 2015-03-13 DIAGNOSIS — Z1211 Encounter for screening for malignant neoplasm of colon: Secondary | ICD-10-CM | POA: Diagnosis not present

## 2015-03-13 DIAGNOSIS — K641 Second degree hemorrhoids: Secondary | ICD-10-CM | POA: Diagnosis not present

## 2015-05-08 DIAGNOSIS — E1121 Type 2 diabetes mellitus with diabetic nephropathy: Secondary | ICD-10-CM | POA: Diagnosis not present

## 2015-05-08 DIAGNOSIS — R1010 Upper abdominal pain, unspecified: Secondary | ICD-10-CM | POA: Diagnosis not present

## 2015-05-08 DIAGNOSIS — I10 Essential (primary) hypertension: Secondary | ICD-10-CM | POA: Diagnosis not present

## 2015-05-08 DIAGNOSIS — N182 Chronic kidney disease, stage 2 (mild): Secondary | ICD-10-CM | POA: Diagnosis not present

## 2015-05-08 DIAGNOSIS — E1142 Type 2 diabetes mellitus with diabetic polyneuropathy: Secondary | ICD-10-CM | POA: Diagnosis not present

## 2015-05-08 DIAGNOSIS — Z79899 Other long term (current) drug therapy: Secondary | ICD-10-CM | POA: Diagnosis not present

## 2015-05-08 DIAGNOSIS — Z794 Long term (current) use of insulin: Secondary | ICD-10-CM | POA: Diagnosis not present

## 2015-05-08 DIAGNOSIS — Z0000 Encounter for general adult medical examination without abnormal findings: Secondary | ICD-10-CM | POA: Diagnosis not present

## 2015-05-08 DIAGNOSIS — E1140 Type 2 diabetes mellitus with diabetic neuropathy, unspecified: Secondary | ICD-10-CM | POA: Diagnosis not present

## 2015-05-08 DIAGNOSIS — E785 Hyperlipidemia, unspecified: Secondary | ICD-10-CM | POA: Diagnosis not present

## 2015-05-22 ENCOUNTER — Encounter (HOSPITAL_COMMUNITY): Payer: Self-pay | Admitting: Emergency Medicine

## 2015-05-22 ENCOUNTER — Emergency Department (HOSPITAL_COMMUNITY): Payer: Medicare Other

## 2015-05-22 ENCOUNTER — Emergency Department (HOSPITAL_COMMUNITY)
Admission: EM | Admit: 2015-05-22 | Discharge: 2015-05-22 | Disposition: A | Discharge status: Home / Self Care | Payer: Medicare Other | Attending: Emergency Medicine | Admitting: Emergency Medicine

## 2015-05-22 DIAGNOSIS — Z87891 Personal history of nicotine dependence: Secondary | ICD-10-CM | POA: Insufficient documentation

## 2015-05-22 DIAGNOSIS — R079 Chest pain, unspecified: Secondary | ICD-10-CM | POA: Diagnosis not present

## 2015-05-22 DIAGNOSIS — R110 Nausea: Secondary | ICD-10-CM | POA: Insufficient documentation

## 2015-05-22 DIAGNOSIS — I160 Hypertensive urgency: Secondary | ICD-10-CM

## 2015-05-22 DIAGNOSIS — Z79899 Other long term (current) drug therapy: Secondary | ICD-10-CM | POA: Diagnosis not present

## 2015-05-22 DIAGNOSIS — I10 Essential (primary) hypertension: Secondary | ICD-10-CM | POA: Insufficient documentation

## 2015-05-22 DIAGNOSIS — Z7982 Long term (current) use of aspirin: Secondary | ICD-10-CM | POA: Diagnosis not present

## 2015-05-22 DIAGNOSIS — H538 Other visual disturbances: Secondary | ICD-10-CM | POA: Insufficient documentation

## 2015-05-22 DIAGNOSIS — R51 Headache: Secondary | ICD-10-CM | POA: Diagnosis not present

## 2015-05-22 DIAGNOSIS — E119 Type 2 diabetes mellitus without complications: Secondary | ICD-10-CM | POA: Diagnosis not present

## 2015-05-22 DIAGNOSIS — R5383 Other fatigue: Secondary | ICD-10-CM | POA: Insufficient documentation

## 2015-05-22 DIAGNOSIS — R030 Elevated blood-pressure reading, without diagnosis of hypertension: Secondary | ICD-10-CM | POA: Diagnosis not present

## 2015-05-22 LAB — BASIC METABOLIC PANEL
Anion gap: 9 (ref 5–15)
BUN: 8 mg/dL (ref 6–20)
CO2: 26 mmol/L (ref 22–32)
Calcium: 9.1 mg/dL (ref 8.9–10.3)
Chloride: 100 mmol/L — ABNORMAL LOW (ref 101–111)
Creatinine, Ser: 0.85 mg/dL (ref 0.44–1.00)
GFR calc Af Amer: >60 mL/min (ref >60)
GFR calc non Af Amer: >60 mL/min (ref >60)
Glucose, Bld: 264 mg/dL — ABNORMAL HIGH (ref 65–99)
Potassium: 3.9 mmol/L (ref 3.5–5.1)
Sodium: 135 mmol/L (ref 135–145)

## 2015-05-22 LAB — I-STAT TROPONIN, ED: Troponin i, poc: 0 ng/mL (ref 0.00–0.08)

## 2015-05-22 LAB — CBC
HCT: 32 % — ABNORMAL LOW (ref 36.0–46.0)
Hemoglobin: 10.8 g/dL — ABNORMAL LOW (ref 12.0–15.0)
MCH: 28.3 pg (ref 26.0–34.0)
MCHC: 33.8 g/dL (ref 30.0–36.0)
MCV: 84 fL (ref 78.0–100.0)
Platelets: 229 10*3/uL (ref 150–400)
RBC: 3.81 MIL/uL — ABNORMAL LOW (ref 3.87–5.11)
RDW: 12.1 % (ref 11.5–15.5)
WBC: 4.2 10*3/uL (ref 4.0–10.5)

## 2015-05-22 MED ORDER — HYDROCHLOROTHIAZIDE 25 MG PO TABS: 25.0000 mg | ORAL_TABLET | Freq: Every day | ORAL | Status: DC

## 2015-05-22 MED ORDER — HYDRALAZINE HCL 20 MG/ML IJ SOLN
10.0000 mg | INTRAMUSCULAR | Status: AC
  Administered 2015-05-22: 10 mg via INTRAVENOUS
  Filled 2015-05-22: qty 1

## 2015-05-22 NOTE — Discharge Instructions (Signed)
1. Medications: HCTZ, usual home medications 2. Treatment: rest, drink plenty of fluids,  3. Follow Up: Please followup with your primary doctor in 1-2 days for discussion of your diagnoses and further evaluation after today's visit; if you do not have a primary care doctor use the resource guide provided to find one; Please return to the ER for return of chest pain, vision changes, lightheadedness or other concerns   Hypertension Hypertension, commonly called high blood pressure, is when the force of blood pumping through your arteries is too strong. Your arteries are the blood vessels that carry blood from your heart throughout your body. A blood pressure reading consists of a higher number over a lower number, such as 110/72. The higher number (systolic) is the pressure inside your arteries when your heart pumps. The lower number (diastolic) is the pressure inside your arteries when your heart relaxes. Ideally you want your blood pressure below 120/80. Hypertension forces your heart to work harder to pump blood. Your arteries may become narrow or stiff. Having hypertension puts you at risk for heart disease, stroke, and other problems.  RISK FACTORS Some risk factors for high blood pressure are controllable. Others are not.  Risk factors you cannot control include:   Race. You may be at higher risk if you are African American.  Age. Risk increases with age.  Gender. Men are at higher risk than women before age 35 years. After age 34, women are at higher risk than men. Risk factors you can control include:  Not getting enough exercise or physical activity.  Being overweight.  Getting too much fat, sugar, calories, or salt in your diet.  Drinking too much alcohol. SIGNS AND SYMPTOMS Hypertension does not usually cause signs or symptoms. Extremely high blood pressure (hypertensive crisis) may cause headache, anxiety, shortness of breath, and nosebleed. DIAGNOSIS  To check if you have  hypertension, your health care provider will measure your blood pressure while you are seated, with your arm held at the level of your heart. It should be measured at least twice using the same arm. Certain conditions can cause a difference in blood pressure between your right and left arms. A blood pressure reading that is higher than normal on one occasion does not mean that you need treatment. If one blood pressure reading is high, ask your health care provider about having it checked again. TREATMENT  Treating high blood pressure includes making lifestyle changes and possibly taking medicine. Living a healthy lifestyle can help lower high blood pressure. You may need to change some of your habits. Lifestyle changes may include:  Following the DASH diet. This diet is high in fruits, vegetables, and whole grains. It is low in salt, red meat, and added sugars.  Getting at least 2 hours of brisk physical activity every week.  Losing weight if necessary.  Not smoking.  Limiting alcoholic beverages.  Learning ways to reduce stress. If lifestyle changes are not enough to get your blood pressure under control, your health care provider may prescribe medicine. You may need to take more than one. Work closely with your health care provider to understand the risks and benefits. HOME CARE INSTRUCTIONS  Have your blood pressure rechecked as directed by your health care provider.   Take medicines only as directed by your health care provider. Follow the directions carefully. Blood pressure medicines must be taken as prescribed. The medicine does not work as well when you skip doses. Skipping doses also puts you at risk for  problems.   Do not smoke.   Monitor your blood pressure at home as directed by your health care provider. SEEK MEDICAL CARE IF:   You think you are having a reaction to medicines taken.  You have recurrent headaches or feel dizzy.  You have swelling in your  ankles.  You have trouble with your vision. SEEK IMMEDIATE MEDICAL CARE IF:  You develop a severe headache or confusion.  You have unusual weakness, numbness, or feel faint.  You have severe chest or abdominal pain.  You vomit repeatedly.  You have trouble breathing. MAKE SURE YOU:   Understand these instructions.  Will watch your condition.  Will get help right away if you are not doing well or get worse. Document Released: 11/03/2005 Document Revised: 03/20/2014 Document Reviewed: 08/26/2013 [facility] Patient Information 2015 [place], [place]. This information is not intended to replace advice given to you by your health care provider. Make sure you discuss any questions you have with your health care provider.

## 2015-05-22 NOTE — ED Provider Notes (Signed)
  Face-to-face evaluation   History: She complains of mild lightheadedness, and blood pressure was high. Her doctor recently started her on metoprolol. No neck pain, back pain, nausea, vomiting.  Physical exam: Alert, calm, cooperative. No facial asymmetry. Moves all extremities equally. No respiratory distress.  Patient reports headache improved after treatment with hydralazine and blood pressure improved.  Medical screening examination/treatment/procedure(s) were conducted as a shared visit with non-physician practitioner(s) and myself.  I personally evaluated the patient during the encounter  Daleen Bo, MD 05/22/15 2355

## 2015-05-22 NOTE — ED Provider Notes (Signed)
CSN: [id number]     Arrival date & time 05/22/15  1242 History   First MD Initiated Contact with Patient 05/22/15 1507     Chief Complaint  Patient presents with  . Hypertension     (Consider location/radiation/quality/duration/timing/severity/associated sxs/prior Treatment) The history is provided by the patient and medical records. No language interpreter was used.     Tricia Huynh is a 59 y.o. female  with a hx of IDDM, HTN presents to the Emergency Department complaining of gradual, persistent, progressively worsening HTN onset several weeks ago. Pt reports her MD has been treating her HTN but that she had new and persistent symptoms this weekend.  She spoke with her MDs office today and was referred to the ED.  Associated symptoms include chest pain yesterday evening that resolved and has not returned.  She described the pain as sharp pins in the center of her chest without radiation rated a 8/10.  She reports associated intermittent lightheadedness sometimes with nausea over the weekend that is not currently present.  She also reports constant generalized headache and blurred vision since sometime over the weekend - she is unsure of the exact onset.  She denies diplopia.  She reports that this has happened before when her BP is up.  She reports that Dr. Cheron Schaumann added a new BP medication at night on June 21st after her BP was found to be elevated in the office.  Nothing makes it better and nothing makes it worse.  Pt denies fever, chills, headache, neck pain, SOB, abd pain, vomiting, diarrhea, weakness, syncope.    PCP: Alessandra Grout - [place]   Past Medical History  Diagnosis Date  . Diabetes mellitus without complication   . Hypertension    History reviewed. No pertinent past surgical history. No family history on file. History  Substance Use Topics  . Smoking status: Former Research scientist (life sciences)  . Smokeless tobacco: Never Used  . Alcohol Use: No   OB History    No data available      Review of Systems  Constitutional: Positive for fatigue. Negative for fever, diaphoresis, appetite change and unexpected weight change.  HENT: Negative for mouth sores.   Eyes: Positive for visual disturbance.  Respiratory: Negative for cough, chest tightness, shortness of breath and wheezing.   Cardiovascular: Positive for chest pain.  Gastrointestinal: Positive for nausea. Negative for vomiting, abdominal pain, diarrhea and constipation.  Endocrine: Negative for polydipsia, polyphagia and polyuria.  Genitourinary: Negative for dysuria, urgency, frequency and hematuria.  Musculoskeletal: Negative for back pain and neck stiffness.  Skin: Negative for rash.  Allergic/Immunologic: Negative for immunocompromised state.  Neurological: Positive for light-headedness and headaches. Negative for syncope.  Hematological: Does not bruise/bleed easily.  Psychiatric/Behavioral: Negative for sleep disturbance. The patient is not nervous/anxious.       Allergies  Percocet and Vicodin  Home Medications   Prior to Admission medications   Medication Sig Start Date End Date Taking? Authorizing Provider  amitriptyline (ELAVIL) 50 MG tablet Take 50 mg by mouth at bedtime. 05/08/15  Yes Historical Provider, MD  amLODipine-olmesartan (AZOR) 5-40 MG per tablet Take 1 tablet by mouth daily.   Yes Historical Provider, MD  aspirin-acetaminophen-caffeine (EXCEDRIN MIGRAINE) [phone] MG per tablet Take 2 tablets by mouth every 6 (six) hours as needed for headache.   Yes Historical Provider, MD  atorvastatin (LIPITOR) 40 MG tablet Take 40 mg by mouth daily. 05/10/15  Yes Historical Provider, MD  gabapentin (NEURONTIN) 600 MG tablet Take 600 mg by mouth 2 (  two) times daily.  10/08/14  Yes Historical Provider, MD  metFORMIN (GLUCOPHAGE) 500 MG tablet Take 500 mg by mouth 2 (two) times daily. 10/09/14  Yes Historical Provider, MD  metoprolol succinate (TOPROL-XL) 25 MG 24 hr tablet Take 50 mg by mouth at  bedtime. 05/08/15  Yes Historical Provider, MD  NOVOLOG FLEXPEN 100 UNIT/ML FlexPen Inject 20-50 Units into the skin 2 (two) times daily. 50 units in am and 20 units in pm 10/08/14  Yes Historical Provider, MD  ranitidine (ZANTAC) 150 MG tablet Take 1 tablet (150 mg total) by mouth 2 (two) times daily. 10/18/14  Yes Blanchie Dessert, MD  sucralfate (CARAFATE) 1 G tablet Take 1 tablet (1 g total) by mouth 4 (four) times daily -  with meals and at bedtime. 10/18/14  Yes Blanchie Dessert, MD  Vitamin D, Ergocalciferol, (DRISDOL) 50000 UNITS CAPS capsule Take 50,000 Units by mouth every 7 (seven) days. On Mondays   Yes Historical Provider, MD  hydrochlorothiazide (HYDRODIURIL) 25 MG tablet Take 1 tablet (25 mg total) by mouth daily. 05/22/15   Tricia Mcclarty, PA-C  LANTUS SOLOSTAR 100 UNIT/ML Solostar Pen Inject 20-50 Units into the skin at bedtime.  10/08/14   Historical Provider, MD   BP 184/110 mmHg  Pulse 80  Temp(Src) 98.6 F (37 C) (Oral)  Resp 14  SpO2 100% Physical Exam  Constitutional: She is oriented to person, place, and time. She appears well-developed and well-nourished. No distress.  Awake, alert, nontoxic appearance  HENT:  Head: Normocephalic and atraumatic.  Mouth/Throat: Oropharynx is clear and moist. No oropharyngeal exudate.  Eyes: Conjunctivae and EOM are normal. Pupils are equal, round, and reactive to light. No scleral icterus.  No horizontal, vertical or rotational nystagmus  Neck: Normal range of motion. Neck supple.  Full active and passive ROM without pain No midline or paraspinal tenderness No nuchal rigidity or meningeal signs  Cardiovascular: Normal rate, regular rhythm, normal heart sounds and intact distal pulses.   No murmur heard. Pulmonary/Chest: Effort normal and breath sounds normal. No respiratory distress. She has no wheezes. She has no rales.  Equal chest expansion  Abdominal: Soft. Bowel sounds are normal. She exhibits no mass. There is no  tenderness. There is no rebound and no guarding.  Musculoskeletal: Normal range of motion. She exhibits no edema.  Lymphadenopathy:    She has no cervical adenopathy.  Neurological: She is alert and oriented to person, place, and time. She has normal reflexes. No cranial nerve deficit. She exhibits normal muscle tone. Coordination normal.  Mental Status:  Alert, oriented, thought content appropriate. Speech fluent without evidence of aphasia. Able to follow 2 step commands without difficulty.  Cranial Nerves:  II:  Peripheral visual fields grossly normal, pupils equal, round, reactive to light III,IV, VI: ptosis not present, extra-ocular motions intact bilaterally  V,VII: smile symmetric, facial light touch sensation equal VIII: hearing grossly normal bilaterally  IX,X: gag reflex present  XI: bilateral shoulder shrug equal and strong XII: midline tongue extension  Motor:  5/5 in upper and lower extremities bilaterally including strong and equal grip strength and dorsiflexion/plantar flexion Sensory: Pinprick and light touch normal in all extremities.  Deep Tendon Reflexes: 2+ and symmetric  Cerebellar: normal finger-to-nose with bilateral upper extremities Gait: normal gait and balance CV: distal pulses palpable throughout   Skin: Skin is warm and dry. No rash noted. She is not diaphoretic.  Psychiatric: She has a normal mood and affect. Her behavior is normal. Judgment and thought content normal.  Nursing note and vitals reviewed.   ED Course  Procedures (including critical care time) Labs Review Labs Reviewed  CBC - Abnormal; Notable for the following:    RBC 3.81 (*)    Hemoglobin 10.8 (*)    HCT 32.0 (*)    All other components within normal limits  BASIC METABOLIC PANEL - Abnormal; Notable for the following:    Chloride 100 (*)    Glucose, Bld 264 (*)    All other components within normal limits  I-STAT TROPOININ, ED    Imaging Review Ct Head Wo Contrast  05/22/2015    CLINICAL DATA:  59 year old female with high blood pressure and frontal headache.  EXAM: CT HEAD WITHOUT CONTRAST  TECHNIQUE: Contiguous axial images were obtained from the base of the skull through the vertex without intravenous contrast.  COMPARISON:  None  FINDINGS: The ventricles and the sulci are appropriate in size for the patient's age. There is no intracranial hemorrhage. No midline shift or mass effect identified. The gray-white matter differentiation is preserved.  The visualized paranasal sinuses and mastoid air cells are well aerated. The calvarium is intact. A 1.0 x 2.3 cm ovoid soft tissue lesion with interspersed fat is noted along the outer table of the left occipital calvarium. There is no surrounding reactive changes or erosion of the adjacent calvarium.  IMPRESSION: No acute intracranial pathology.   Electronically Signed   By: Anner Crete M.D.   On: 05/22/2015 15:57     EKG Interpretation   Date/Time:  Tuesday May 22 2015 13:40:53 EDT Ventricular Rate:  92 PR Interval:  142 QRS Duration: 76 QT Interval:  390 QTC Calculation: 482 R Axis:   5 Text Interpretation:  Normal sinus rhythm Prolonged QT Abnormal ECG No old  tracing to compare Confirmed by [facility]  MD, ELLIOTT [phone]) on 05/22/2015  9:15:31 PM         MDM   Final diagnoses:  Hypertensive urgency  Non-insulin dependent type 2 diabetes mellitus   Parke Simmers resents with hypertension, headache, chest pain yesterday, lightheadedness and nausea.  Patient found to be significantly hypertensive here in the emergency department 203/113. She reports that her M.D. if attempting to treat this however her symptoms are new this weekend. Will obtain labs, CT scan and reassess.    Will also give hydralazine as symptoms persist. EKG nonischemic.  4:33 PM Labs reassuring with negative troponin. Mild anemia at 10.8. No evidence of endorgan damage as patient has a normal BUN and creatinine. CT head without acute  intracranial pathology including no evidence of intracranial hemorrhage midline shift or mass effect. Patient has a normal neurologic exam.  4:55 PM Patient reports she is feeling much better.  Blood pressure is improving.  Patient's symptoms of lightheadedness and vision changes have resolved.  Patient with hypertensive urgency improved in the emergency department. Will add HCTZ 25 mg per day to her daily regimen. She is to follow with her primary care physician in 24-48 hours. She is to return to the emergency department for returning symptoms.  No evidence of ACS.  The patient was discussed with and seen by Dr. Eulis Foster who agrees with the treatment plan.   Jarrett Soho Keleigh Kazee, PA-C 05/22/15 2116  Daleen Bo, MD 05/22/15 [phone]

## 2015-05-22 NOTE — ED Notes (Signed)
Pt A&Ox4, ambulatory at d/c with steady gait, reports she feels much better and her dizziness has gone away.

## 2015-05-22 NOTE — ED Notes (Signed)
Pt. Stated, I started having HA with some light headedness since Sat. And at [facility] aid they said my BP was up and needed to come here.

## 2015-05-26 ENCOUNTER — Emergency Department (HOSPITAL_COMMUNITY): Payer: Medicare Other

## 2015-05-26 ENCOUNTER — Encounter (HOSPITAL_COMMUNITY): Payer: Self-pay | Admitting: Emergency Medicine

## 2015-05-26 ENCOUNTER — Emergency Department (HOSPITAL_COMMUNITY)
Admission: EM | Admit: 2015-05-26 | Discharge: 2015-05-26 | Disposition: A | Discharge status: Home / Self Care | Payer: Medicare Other | Attending: Emergency Medicine | Admitting: Emergency Medicine

## 2015-05-26 DIAGNOSIS — I10 Essential (primary) hypertension: Secondary | ICD-10-CM | POA: Insufficient documentation

## 2015-05-26 DIAGNOSIS — Z7982 Long term (current) use of aspirin: Secondary | ICD-10-CM | POA: Insufficient documentation

## 2015-05-26 DIAGNOSIS — Z79899 Other long term (current) drug therapy: Secondary | ICD-10-CM | POA: Insufficient documentation

## 2015-05-26 DIAGNOSIS — Z794 Long term (current) use of insulin: Secondary | ICD-10-CM | POA: Insufficient documentation

## 2015-05-26 DIAGNOSIS — R000 Tachycardia, unspecified: Secondary | ICD-10-CM | POA: Diagnosis not present

## 2015-05-26 DIAGNOSIS — R51 Headache: Secondary | ICD-10-CM | POA: Insufficient documentation

## 2015-05-26 DIAGNOSIS — Z3202 Encounter for pregnancy test, result negative: Secondary | ICD-10-CM | POA: Insufficient documentation

## 2015-05-26 DIAGNOSIS — E119 Type 2 diabetes mellitus without complications: Secondary | ICD-10-CM | POA: Diagnosis not present

## 2015-05-26 DIAGNOSIS — Z87891 Personal history of nicotine dependence: Secondary | ICD-10-CM | POA: Insufficient documentation

## 2015-05-26 DIAGNOSIS — R030 Elevated blood-pressure reading, without diagnosis of hypertension: Secondary | ICD-10-CM | POA: Diagnosis not present

## 2015-05-26 LAB — BASIC METABOLIC PANEL
Anion gap: 9 (ref 5–15)
BUN: 13 mg/dL (ref 6–20)
CO2: 27 mmol/L (ref 22–32)
Calcium: 9.2 mg/dL (ref 8.9–10.3)
Chloride: 101 mmol/L (ref 101–111)
Creatinine, Ser: 0.94 mg/dL (ref 0.44–1.00)
GFR calc Af Amer: >60 mL/min (ref >60)
GFR calc non Af Amer: >60 mL/min (ref >60)
Glucose, Bld: 262 mg/dL — ABNORMAL HIGH (ref 65–99)
Potassium: 3.9 mmol/L (ref 3.5–5.1)
Sodium: 137 mmol/L (ref 135–145)

## 2015-05-26 LAB — CBC
HCT: 32.5 % — ABNORMAL LOW (ref 36.0–46.0)
Hemoglobin: 11 g/dL — ABNORMAL LOW (ref 12.0–15.0)
MCH: 28.9 pg (ref 26.0–34.0)
MCHC: 33.8 g/dL (ref 30.0–36.0)
MCV: 85.3 fL (ref 78.0–100.0)
Platelets: 246 10*3/uL (ref 150–400)
RBC: 3.81 MIL/uL — ABNORMAL LOW (ref 3.87–5.11)
RDW: 12.5 % (ref 11.5–15.5)
WBC: 4.8 10*3/uL (ref 4.0–10.5)

## 2015-05-26 LAB — URINALYSIS, ROUTINE W REFLEX MICROSCOPIC
Glucose, UA: 500 mg/dL — AB
Hgb urine dipstick: NEGATIVE
Ketones, ur: 15 mg/dL — AB
Leukocytes, UA: NEGATIVE
Nitrite: NEGATIVE
Protein, ur: NEGATIVE mg/dL
Specific Gravity, Urine: 1.027 (ref 1.005–1.030)
Urobilinogen, UA: 0.2 mg/dL (ref 0.0–1.0)
pH: 5 (ref 5.0–8.0)

## 2015-05-26 LAB — PREGNANCY, URINE: Preg Test, Ur: NEGATIVE

## 2015-05-26 MED ORDER — AMLODIPINE BESYLATE 5 MG PO TABS: 5.0000 mg | ORAL_TABLET | Freq: Once | ORAL | Status: DC

## 2015-05-26 MED ORDER — SODIUM CHLORIDE 0.9 % IV BOLUS (SEPSIS)
1000.0000 mL | Freq: Once | INTRAVENOUS | Status: AC
  Administered 2015-05-26: 1000 mL via INTRAVENOUS

## 2015-05-26 MED ORDER — METOPROLOL TARTRATE 25 MG PO TABS
50.0000 mg | ORAL_TABLET | Freq: Once | ORAL | Status: AC
  Administered 2015-05-26: 50 mg via ORAL
  Filled 2015-05-26: qty 2

## 2015-05-26 MED ORDER — HYDRALAZINE HCL 20 MG/ML IJ SOLN
10.0000 mg | INTRAMUSCULAR | Status: AC
  Administered 2015-05-26: 10 mg via INTRAVENOUS
  Filled 2015-05-26: qty 1

## 2015-05-26 MED ORDER — METOCLOPRAMIDE HCL 5 MG/ML IJ SOLN
10.0000 mg | Freq: Once | INTRAMUSCULAR | Status: AC
  Administered 2015-05-26: 10 mg via INTRAVENOUS
  Filled 2015-05-26: qty 2

## 2015-05-26 MED ORDER — DIPHENHYDRAMINE HCL 50 MG/ML IJ SOLN
25.0000 mg | Freq: Once | INTRAMUSCULAR | Status: AC
  Administered 2015-05-26: 25 mg via INTRAVENOUS
  Filled 2015-05-26: qty 1

## 2015-05-26 NOTE — ED Notes (Signed)
Pt. Stated, I've had a headache and I know my BP is up .

## 2015-05-26 NOTE — ED Provider Notes (Signed)
CSN: [id number]     Arrival date & time 05/26/15  1422 History   First MD Initiated Contact with Patient 05/26/15 1604     Chief Complaint  Patient presents with  . Hypertension     (Consider location/radiation/quality/duration/timing/severity/associated sxs/prior Treatment) HPI Comments: Patient is a 59 yo F PMHx significant for DM, HTN presenting to the ED with complaint of continued throbbing headache and hypertension since being seen in the ER on July 5th. Patient states her symptoms have persisted. She has tried taking Excedrin with little to no improvement of her headaches. States she was supposed to f/u with her PCP but was unable to get a ride to the office. Denies any CP, SOB, acute visual disturbances, fevers, chills, nausea, vomiting, diarrhea, syncope. Denies missing any doses of blood pressure medications.   Patient is a 59 y.o. female presenting with hypertension.  Hypertension Associated symptoms include headaches.    Past Medical History  Diagnosis Date  . Diabetes mellitus without complication   . Hypertension    History reviewed. No pertinent past surgical history. No family history on file. History  Substance Use Topics  . Smoking status: Former Research scientist (life sciences)  . Smokeless tobacco: Never Used  . Alcohol Use: No   OB History    No data available     Review of Systems  Neurological: Positive for headaches.  All other systems reviewed and are negative.     Allergies  Percocet and Vicodin  Home Medications   Prior to Admission medications   Medication Sig Start Date End Date Taking? Authorizing Provider  amitriptyline (ELAVIL) 50 MG tablet Take 50 mg by mouth at bedtime. 05/08/15   Historical Provider, MD  amLODipine-olmesartan (AZOR) 5-40 MG per tablet Take 1 tablet by mouth daily.    Historical Provider, MD  aspirin-acetaminophen-caffeine (EXCEDRIN MIGRAINE) [phone] MG per tablet Take 2 tablets by mouth every 6 (six) hours as needed for headache.     Historical Provider, MD  atorvastatin (LIPITOR) 40 MG tablet Take 40 mg by mouth daily. 05/10/15   Historical Provider, MD  gabapentin (NEURONTIN) 600 MG tablet Take 600 mg by mouth 2 (two) times daily.  10/08/14   Historical Provider, MD  hydrochlorothiazide (HYDRODIURIL) 25 MG tablet Take 1 tablet (25 mg total) by mouth daily. 05/22/15   Hannah Muthersbaugh, PA-C  LANTUS SOLOSTAR 100 UNIT/ML Solostar Pen Inject 20-50 Units into the skin at bedtime.  10/08/14   Historical Provider, MD  metFORMIN (GLUCOPHAGE) 500 MG tablet Take 500 mg by mouth 2 (two) times daily. 10/09/14   Historical Provider, MD  metoprolol succinate (TOPROL-XL) 25 MG 24 hr tablet Take 50 mg by mouth at bedtime. 05/08/15   Historical Provider, MD  NOVOLOG FLEXPEN 100 UNIT/ML FlexPen Inject 20-50 Units into the skin 2 (two) times daily. 50 units in am and 20 units in pm 10/08/14   Historical Provider, MD  ranitidine (ZANTAC) 150 MG tablet Take 1 tablet (150 mg total) by mouth 2 (two) times daily. 10/18/14   Blanchie Dessert, MD  sucralfate (CARAFATE) 1 G tablet Take 1 tablet (1 g total) by mouth 4 (four) times daily -  with meals and at bedtime. 10/18/14   Blanchie Dessert, MD  Vitamin D, Ergocalciferol, (DRISDOL) 50000 UNITS CAPS capsule Take 50,000 Units by mouth every 7 (seven) days. On Mondays    Historical Provider, MD   BP 160/88 mmHg  Pulse 94  Temp(Src) 98.6 F (37 C) (Oral)  Resp 15  Ht 5\' 7"  (1.702 m)  Wt  136 lb 4.8 oz (61.825 kg)  BMI 21.34 kg/m2  SpO2 99% Physical Exam  Constitutional: She is oriented to person, place, and time. She appears well-developed and well-nourished. No distress.  HENT:  Head: Normocephalic and atraumatic.  Right Ear: External ear normal.  Left Ear: External ear normal.  Nose: Nose normal.  Mouth/Throat: Oropharynx is clear and moist. No oropharyngeal exudate.  Eyes: Conjunctivae and EOM are normal. Pupils are equal, round, and reactive to light.  Neck: Normal range of motion. Neck  supple.  Cardiovascular: Normal rate, regular rhythm, normal heart sounds and intact distal pulses.   Pulmonary/Chest: Effort normal and breath sounds normal. No respiratory distress.  Abdominal: Soft. Bowel sounds are normal. There is no tenderness.  Musculoskeletal: Normal range of motion. She exhibits no edema.  Neurological: She is alert and oriented to person, place, and time. She has normal strength. No cranial nerve deficit. Gait normal. GCS eye subscore is 4. GCS verbal subscore is 5. GCS motor subscore is 6.  Subjective decreased sensation to left forearm, otherwise intact. No pronator drift.  Bilateral heel-knee-shin intact.  Skin: Skin is warm and dry. No rash noted. She is not diaphoretic.  Nursing note and vitals reviewed.   ED Course  Procedures (including critical care time) Medications  hydrALAZINE (APRESOLINE) injection 10 mg (10 mg Intravenous Given 05/26/15 1653)  diphenhydrAMINE (BENADRYL) injection 25 mg (25 mg Intravenous Given 05/26/15 1816)  metoCLOPramide (REGLAN) injection 10 mg (10 mg Intravenous Given 05/26/15 1816)  sodium chloride 0.9 % bolus 1,000 mL (0 mLs Intravenous Stopped 05/26/15 2031)  metoprolol tartrate (LOPRESSOR) tablet 50 mg (50 mg Oral Given 05/26/15 1942)    Labs Review Labs Reviewed  BASIC METABOLIC PANEL - Abnormal; Notable for the following:    Glucose, Bld 262 (*)    All other components within normal limits  CBC - Abnormal; Notable for the following:    RBC 3.81 (*)    Hemoglobin 11.0 (*)    HCT 32.5 (*)    All other components within normal limits  URINALYSIS, ROUTINE W REFLEX MICROSCOPIC (NOT AT [facility]) - Abnormal; Notable for the following:    Color, Urine AMBER (*)    Glucose, UA 500 (*)    Bilirubin Urine SMALL (*)    Ketones, ur 15 (*)    All other components within normal limits  PREGNANCY, URINE    Imaging Review Ct Head Wo Contrast  05/26/2015   CLINICAL DATA:  Headache and high blood pressure today.  EXAM: CT HEAD WITHOUT  CONTRAST  TECHNIQUE: Contiguous axial images were obtained from the base of the skull through the vertex without intravenous contrast.  COMPARISON:  05/22/2015.  FINDINGS: The ventricles are normal in size and configuration. No extra-axial fluid collections are identified. The gray-white differentiation is normal. No CT findings for acute intracranial process such as hemorrhage or infarction. No mass lesions. The brainstem and cerebellum are grossly normal.  The bony structures are intact. The paranasal sinuses and mastoid air cells are clear except for scattered ethmoid sinus disease. The globes are intact. Stable fatty scalp lesion in the left occipital area.  IMPRESSION: Normal head CT.  Stable ethmoid sinus disease.   Electronically Signed   By: Marijo Sanes M.D.   On: 05/26/2015 17:43     EKG Interpretation   Date/Time:  Saturday May 26 2015 16:56:07 EDT Ventricular Rate:  84 PR Interval:  152 QRS Duration: 85 QT Interval:  398 QTC Calculation: 470 R Axis:   -14 Text  Interpretation:  Sinus rhythm Abnormal R-wave progression, early  transition No significant change since last tracing Confirmed by POLLINA   MD, CHRISTOPHER [phone]) on 05/26/2015 5:00:10 PM      MDM   Final diagnoses:  Essential hypertension    Filed Vitals:   05/26/15 2035  BP:   Pulse:   Temp: 98.6 F (37 C)  Resp:    Afebrile, NAD, non-toxic appearing, AAOx4.  I have reviewed nursing notes, vital signs, and all appropriate lab and imaging results if ordered as above.   Patient is a 59 yo F presenting to the ED for continued complaint of HA and HTN since being seen in the ED 05/22/15. She has not follow up with her PCP yet. No neurofocal deficits on examination. Labs reassuring no evidence of end organ damage. CT unremarkable. Hypertension and tachycardia improved while in ED. HA resolved. Advised patient to take both of her anti-hypertensives and follow up with her PCP on Monday. Return precautions discussed.  Patient is agreeable to plan. Patient is stable at time of discharge. Patient d/w with Dr. Betsey Holiday, agrees with plan.      Baron Sane, PA-C 05/26/15 2112  Orpah Greek, MD 05/28/15 Laureen Abrahams

## 2015-05-26 NOTE — Discharge Instructions (Signed)
Please follow up with your primary care physician in 1-2 days. If you do not have one please call the [place] number listed above.Please read all discharge instructions and return precautions.    Hypertension Hypertension, commonly called high blood pressure, is when the force of blood pumping through your arteries is too strong. Your arteries are the blood vessels that carry blood from your heart throughout your body. A blood pressure reading consists of a higher number over a lower number, such as 110/72. The higher number (systolic) is the pressure inside your arteries when your heart pumps. The lower number (diastolic) is the pressure inside your arteries when your heart relaxes. Ideally you want your blood pressure below 120/80. Hypertension forces your heart to work harder to pump blood. Your arteries may become narrow or stiff. Having hypertension puts you at risk for heart disease, stroke, and other problems.  RISK FACTORS Some risk factors for high blood pressure are controllable. Others are not.  Risk factors you cannot control include:   Race. You may be at higher risk if you are African American.  Age. Risk increases with age.  Gender. Men are at higher risk than women before age 7 years. After age 72, women are at higher risk than men. Risk factors you can control include:  Not getting enough exercise or physical activity.  Being overweight.  Getting too much fat, sugar, calories, or salt in your diet.  Drinking too much alcohol. SIGNS AND SYMPTOMS Hypertension does not usually cause signs or symptoms. Extremely high blood pressure (hypertensive crisis) may cause headache, anxiety, shortness of breath, and nosebleed. DIAGNOSIS  To check if you have hypertension, your health care provider will measure your blood pressure while you are seated, with your arm held at the level of your heart. It should be measured at least twice using the same arm. Certain  conditions can cause a difference in blood pressure between your right and left arms. A blood pressure reading that is higher than normal on one occasion does not mean that you need treatment. If one blood pressure reading is high, ask your health care provider about having it checked again. TREATMENT  Treating high blood pressure includes making lifestyle changes and possibly taking medicine. Living a healthy lifestyle can help lower high blood pressure. You may need to change some of your habits. Lifestyle changes may include:  Following the DASH diet. This diet is high in fruits, vegetables, and whole grains. It is low in salt, red meat, and added sugars.  Getting at least 2 hours of brisk physical activity every week.  Losing weight if necessary.  Not smoking.  Limiting alcoholic beverages.  Learning ways to reduce stress. If lifestyle changes are not enough to get your blood pressure under control, your health care provider may prescribe medicine. You may need to take more than one. Work closely with your health care provider to understand the risks and benefits. HOME CARE INSTRUCTIONS  Have your blood pressure rechecked as directed by your health care provider.   Take medicines only as directed by your health care provider. Follow the directions carefully. Blood pressure medicines must be taken as prescribed. The medicine does not work as well when you skip doses. Skipping doses also puts you at risk for problems.   Do not smoke.   Monitor your blood pressure at home as directed by your health care provider. SEEK MEDICAL CARE IF:   You think you are having a reaction  to medicines taken.  You have recurrent headaches or feel dizzy.  You have swelling in your ankles.  You have trouble with your vision. SEEK IMMEDIATE MEDICAL CARE IF:  You develop a severe headache or confusion.  You have unusual weakness, numbness, or feel faint.  You have severe chest or abdominal  pain.  You vomit repeatedly.  You have trouble breathing. MAKE SURE YOU:   Understand these instructions.  Will watch your condition.  Will get help right away if you are not doing well or get worse. Document Released: 11/03/2005 Document Revised: 03/20/2014 Document Reviewed: 08/26/2013 [facility] Patient Information 2015 [place], [place]. This information is not intended to replace advice given to you by your health care provider. Make sure you discuss any questions you have with your health care provider.

## 2015-05-26 NOTE — ED Notes (Signed)
Patient transported to CT 

## 2015-06-02 ENCOUNTER — Emergency Department (HOSPITAL_COMMUNITY): Payer: Medicare Other

## 2015-06-02 ENCOUNTER — Encounter (HOSPITAL_COMMUNITY): Payer: Self-pay | Admitting: Emergency Medicine

## 2015-06-02 ENCOUNTER — Emergency Department (HOSPITAL_COMMUNITY)
Admission: EM | Admit: 2015-06-02 | Discharge: 2015-06-02 | Disposition: A | Discharge status: Home / Self Care | Payer: Medicare Other | Attending: Emergency Medicine | Admitting: Emergency Medicine

## 2015-06-02 DIAGNOSIS — Z7982 Long term (current) use of aspirin: Secondary | ICD-10-CM | POA: Diagnosis not present

## 2015-06-02 DIAGNOSIS — R0789 Other chest pain: Secondary | ICD-10-CM | POA: Diagnosis not present

## 2015-06-02 DIAGNOSIS — Z87891 Personal history of nicotine dependence: Secondary | ICD-10-CM | POA: Insufficient documentation

## 2015-06-02 DIAGNOSIS — E119 Type 2 diabetes mellitus without complications: Secondary | ICD-10-CM | POA: Diagnosis not present

## 2015-06-02 DIAGNOSIS — I10 Essential (primary) hypertension: Secondary | ICD-10-CM | POA: Diagnosis not present

## 2015-06-02 DIAGNOSIS — Z79899 Other long term (current) drug therapy: Secondary | ICD-10-CM | POA: Diagnosis not present

## 2015-06-02 DIAGNOSIS — R079 Chest pain, unspecified: Secondary | ICD-10-CM | POA: Diagnosis not present

## 2015-06-02 LAB — BASIC METABOLIC PANEL
Anion gap: 10 (ref 5–15)
BUN: 17 mg/dL (ref 6–20)
CO2: 25 mmol/L (ref 22–32)
Calcium: 9.7 mg/dL (ref 8.9–10.3)
Chloride: 100 mmol/L — ABNORMAL LOW (ref 101–111)
Creatinine, Ser: 0.98 mg/dL (ref 0.44–1.00)
GFR calc Af Amer: >60 mL/min (ref >60)
GFR calc non Af Amer: >60 mL/min (ref >60)
Glucose, Bld: 338 mg/dL — ABNORMAL HIGH (ref 65–99)
Potassium: 3.5 mmol/L (ref 3.5–5.1)
Sodium: 135 mmol/L (ref 135–145)

## 2015-06-02 LAB — I-STAT TROPONIN, ED
Troponin i, poc: 0 ng/mL (ref 0.00–0.08)
Troponin i, poc: 0 ng/mL (ref 0.00–0.08)

## 2015-06-02 LAB — CBC
HCT: 33.2 % — ABNORMAL LOW (ref 36.0–46.0)
Hemoglobin: 11.5 g/dL — ABNORMAL LOW (ref 12.0–15.0)
MCH: 29.1 pg (ref 26.0–34.0)
MCHC: 34.6 g/dL (ref 30.0–36.0)
MCV: 84.1 fL (ref 78.0–100.0)
Platelets: 253 10*3/uL (ref 150–400)
RBC: 3.95 MIL/uL (ref 3.87–5.11)
RDW: 12.1 % (ref 11.5–15.5)
WBC: 5 10*3/uL (ref 4.0–10.5)

## 2015-06-02 NOTE — ED Notes (Signed)
Patient is alert and orientedx4.  Patient was explained discharge instructions and they understood them with no questions.  The patient's daughter, Tricia Huynh is taking the patient home.

## 2015-06-02 NOTE — ED Notes (Signed)
C/o pain in center of chest all day Friday that feels like pins and needles.  Denies sob, nausea, vomiting, or any other symptoms.

## 2015-06-02 NOTE — Discharge Instructions (Signed)
Chest Pain (Nonspecific) Tricia Huynh, you were seen today for high blood pressure.  You need to see a primary physician within 3 days to have your medications evaluated.  Your chest pain workup was negative and you are not having signs of a heart attack.  If any symptoms worsen, come back to the ED immediately. Thank you. It is often hard to give a diagnosis for the cause of chest pain. There is always a chance that your pain could be related to something serious, such as a heart attack or a blood clot in the lungs. You need to follow up with your doctor. HOME CARE  If antibiotic medicine was given, take it as directed by your doctor. Finish the medicine even if you start to feel better.  For the next few days, avoid activities that bring on chest pain. Continue physical activities as told by your doctor.  Do not use any tobacco products. This includes cigarettes, chewing tobacco, and e-cigarettes.  Avoid drinking alcohol.  Only take medicine as told by your doctor.  Follow your doctor's suggestions for more testing if your chest pain does not go away.  Keep all doctor visits you made. GET HELP IF:  Your chest pain does not go away, even after treatment.  You have a rash with blisters on your chest.  You have a fever. GET HELP RIGHT AWAY IF:   You have more pain or pain that spreads to your arm, neck, jaw, back, or belly (abdomen).  You have shortness of breath.  You cough more than usual or cough up blood.  You have very bad back or belly pain.  You feel sick to your stomach (nauseous) or throw up (vomit).  You have very bad weakness.  You pass out (faint).  You have chills. This is an emergency. Do not wait to see if the problems will go away. Call your local emergency services (911 in [place] Do not drive yourself to the hospital. MAKE SURE YOU:   Understand these instructions.  Will watch your condition.  Will get help right away if you are not doing well or get  worse. Document Released: 04/21/2008 Document Revised: 11/08/2013 Document Reviewed: 04/21/2008 [facility] Patient Information 2015 [place], [place]. This information is not intended to replace advice given to you by your health care provider. Make sure you discuss any questions you have with your health care provider. Hypertension Hypertension is another name for high blood pressure. High blood pressure forces your heart to work harder to pump blood. A blood pressure reading has two numbers, which includes a higher number over a lower number (example: 110/72). HOME CARE   Have your blood pressure rechecked by your doctor.  Only take medicine as told by your doctor. Follow the directions carefully. The medicine does not work as well if you skip doses. Skipping doses also puts you at risk for problems.  Do not smoke.  Monitor your blood pressure at home as told by your doctor. GET HELP IF:  You think you are having a reaction to the medicine you are taking.  You have repeat headaches or feel dizzy.  You have puffiness (swelling) in your ankles.  You have trouble with your vision. GET HELP RIGHT AWAY IF:   You get a very bad headache and are confused.  You feel weak, numb, or faint.  You get chest or belly (abdominal) pain.  You throw up (vomit).  You cannot breathe very well. MAKE SURE YOU:   Understand these  instructions.  Will watch your condition.  Will get help right away if you are not doing well or get worse. Document Released: 04/21/2008 Document Revised: 11/08/2013 Document Reviewed: 08/26/2013 [facility] Patient Information 2015 [place], [place]. This information is not intended to replace advice given to you by your health care provider. Make sure you discuss any questions you have with your health care provider.

## 2015-06-02 NOTE — ED Provider Notes (Signed)
CSN: [id number]     Arrival date & time 06/02/15  0138 History   This chart was scribed for Everlene Balls, MD by Chester Holstein, ED Scribe. This patient was seen in room A03C/A03C and the patient's care was started at 1:59 AM.    Chief Complaint  Patient presents with  . Chest Pain     The history is provided by the patient. No language interpreter was used.   HPI Comments: Tricia Huynh is a 59 y.o. female with PMHx of DM and HTN who presents to the Emergency Department complaining of sharp constant waxing and waning central chest pain with onset yesterday morning. She reports movement aggravates the pain. She notes mild associated diaphoresis.  Family member notes pt has been lying around more frequently. She is also c/o elevated BP. Pt was seen on 05/26/15 and 05/22/15 for HTN. Pt has been compliant with her BP medication. Pt denies h/o DVT/PE and CVA. Family member denies confusion. Pt denies vomiting and SOB.  Past Medical History  Diagnosis Date  . Diabetes mellitus without complication   . Hypertension    History reviewed. No pertinent past surgical history. No family history on file. History  Substance Use Topics  . Smoking status: Former Research scientist (life sciences)  . Smokeless tobacco: Never Used  . Alcohol Use: No   OB History    No data available     Review of Systems  Cardiovascular: Positive for chest pain.  A complete 10 system review of systems was obtained and all systems are negative except as noted in the HPI and PMH.      Allergies  Percocet and Vicodin  Home Medications   Prior to Admission medications   Medication Sig Start Date End Date Taking? Authorizing Provider  amitriptyline (ELAVIL) 50 MG tablet Take 50 mg by mouth at bedtime. 05/08/15   Historical Provider, MD  amLODipine-olmesartan (AZOR) 5-40 MG per tablet Take 1 tablet by mouth daily.    Historical Provider, MD  aspirin-acetaminophen-caffeine (EXCEDRIN MIGRAINE) [phone] MG per tablet Take 2 tablets by mouth  every 6 (six) hours as needed for headache.    Historical Provider, MD  atorvastatin (LIPITOR) 40 MG tablet Take 40 mg by mouth daily. 05/10/15   Historical Provider, MD  gabapentin (NEURONTIN) 600 MG tablet Take 600 mg by mouth 2 (two) times daily.  10/08/14   Historical Provider, MD  hydrochlorothiazide (HYDRODIURIL) 25 MG tablet Take 1 tablet (25 mg total) by mouth daily. 05/22/15   Hannah Muthersbaugh, PA-C  LANTUS SOLOSTAR 100 UNIT/ML Solostar Pen Inject 20-50 Units into the skin at bedtime.  10/08/14   Historical Provider, MD  metFORMIN (GLUCOPHAGE) 500 MG tablet Take 500 mg by mouth 2 (two) times daily. 10/09/14   Historical Provider, MD  metoprolol succinate (TOPROL-XL) 25 MG 24 hr tablet Take 50 mg by mouth at bedtime. 05/08/15   Historical Provider, MD  NOVOLOG FLEXPEN 100 UNIT/ML FlexPen Inject 20-50 Units into the skin 2 (two) times daily. 50 units in am and 20 units in pm 10/08/14   Historical Provider, MD  ranitidine (ZANTAC) 150 MG tablet Take 1 tablet (150 mg total) by mouth 2 (two) times daily. 10/18/14   Blanchie Dessert, MD  sucralfate (CARAFATE) 1 G tablet Take 1 tablet (1 g total) by mouth 4 (four) times daily -  with meals and at bedtime. 10/18/14   Blanchie Dessert, MD  Vitamin D, Ergocalciferol, (DRISDOL) 50000 UNITS CAPS capsule Take 50,000 Units by mouth every 7 (seven) days. On Mondays  Historical Provider, MD   BP 225/113 mmHg  Pulse 106  Temp(Src) 97.9 F (36.6 C) (Oral)  Resp 15  SpO2 99% Physical Exam  Constitutional: She is oriented to person, place, and time. She appears well-developed and well-nourished. No distress.  HENT:  Head: Normocephalic and atraumatic.  Nose: Nose normal.  Mouth/Throat: Oropharynx is clear and moist. No oropharyngeal exudate.  Eyes: Conjunctivae and EOM are normal. Pupils are equal, round, and reactive to light. No scleral icterus.  Neck: Normal range of motion. Neck supple. No JVD present. No tracheal deviation present. No thyromegaly  present.  Cardiovascular: Normal rate, regular rhythm and normal heart sounds.  Exam reveals no gallop and no friction rub.   No murmur heard. Pulmonary/Chest: Effort normal and breath sounds normal. No respiratory distress. She has no wheezes. She exhibits no tenderness.  Abdominal: Soft. Bowel sounds are normal. She exhibits no distension and no mass. There is no tenderness. There is no rebound and no guarding.  Musculoskeletal: Normal range of motion. She exhibits no edema or tenderness.  Lymphadenopathy:    She has no cervical adenopathy.  Neurological: She is alert and oriented to person, place, and time. No cranial nerve deficit. She exhibits normal muscle tone.  Skin: Skin is warm and dry. No rash noted. No erythema. No pallor.  Nursing note and vitals reviewed.   ED Course  Procedures (including critical care time) DIAGNOSTIC STUDIES: Oxygen Saturation is 99% on room air, normal by my interpretation.    COORDINATION OF CARE: 2:04 AM Discussed treatment plan with patient at beside, the patient agrees with the plan and has no further questions at this time.   Labs Review Labs Reviewed  BASIC METABOLIC PANEL - Abnormal; Notable for the following:    Chloride 100 (*)    Glucose, Bld 338 (*)    All other components within normal limits  CBC - Abnormal; Notable for the following:    Hemoglobin 11.5 (*)    HCT 33.2 (*)    All other components within normal limits  Randolm Idol, ED    Imaging Review Dg Chest 2 View  06/02/2015   CLINICAL DATA:  59 year old female with chest pain  EXAM: CHEST  2 VIEW  COMPARISON:  None.  FINDINGS: The heart size and mediastinal contours are within normal limits. Both lungs are clear. The visualized skeletal structures are unremarkable.  IMPRESSION: No active cardiopulmonary disease.   Electronically Signed   By: Anner Crete M.D.   On: 06/02/2015 02:25     EKG Interpretation   Date/Time:  Saturday June 02 2015 01:42:09  EDT Ventricular Rate:  101 PR Interval:  174 QRS Duration: 74 QT Interval:  374 QTC Calculation: 484 R Axis:   -11 Text Interpretation:  Sinus tachycardia Right atrial enlargement Cannot  rule out Anterior infarct , age undetermined Abnormal ECG No significant  change since last tracing Confirmed by Glynn Octave [phone]) on  06/02/2015 2:06:37 AM      MDM   Final diagnoses:  None   Patient presents emergency primary for chest pain and hypertension. Initial blood pressure was 250/120. Patient was allowed to relax in the emergency department and had a cardiac workup. Blood pressure spontaneously went down to 140/80 without any intervention.  I am unsure of the cause of the patient's hypertension, she is advised to follow-up with her primary care doctor for further management. Will evaluate chest pain with heart score as her history is low risk. Heart score is currently less  than 3, repeat troponin and EKG is pending for 5 AM.  Repeat troponin and EKG are denies any significant changes. She has remained asymptomatic throughout the night. Her vital signs remain within her normal limits and she is safe for discharge.   I personally performed the services described in this documentation, which was scribed in my presence. The recorded information has been reviewed and is accurate.    Everlene Balls, MD 06/02/15 [phone]

## 2015-06-02 NOTE — ED Notes (Signed)
Family at bedside. 

## 2015-06-04 DIAGNOSIS — I10 Essential (primary) hypertension: Secondary | ICD-10-CM | POA: Diagnosis not present

## 2015-06-05 DIAGNOSIS — E1165 Type 2 diabetes mellitus with hyperglycemia: Secondary | ICD-10-CM | POA: Diagnosis not present

## 2015-06-05 DIAGNOSIS — I129 Hypertensive chronic kidney disease with stage 1 through stage 4 chronic kidney disease, or unspecified chronic kidney disease: Secondary | ICD-10-CM | POA: Diagnosis not present

## 2015-06-05 DIAGNOSIS — F324 Major depressive disorder, single episode, in partial remission: Secondary | ICD-10-CM | POA: Diagnosis not present

## 2015-06-05 DIAGNOSIS — E1142 Type 2 diabetes mellitus with diabetic polyneuropathy: Secondary | ICD-10-CM | POA: Diagnosis not present

## 2015-06-05 DIAGNOSIS — N182 Chronic kidney disease, stage 2 (mild): Secondary | ICD-10-CM | POA: Diagnosis not present

## 2015-06-05 DIAGNOSIS — E1121 Type 2 diabetes mellitus with diabetic nephropathy: Secondary | ICD-10-CM | POA: Diagnosis not present

## 2015-06-06 ENCOUNTER — Emergency Department (HOSPITAL_COMMUNITY): Payer: Medicare Other

## 2015-06-06 ENCOUNTER — Encounter (HOSPITAL_COMMUNITY): Payer: Self-pay | Admitting: *Deleted

## 2015-06-06 ENCOUNTER — Observation Stay (HOSPITAL_COMMUNITY): Payer: Medicare Other

## 2015-06-06 ENCOUNTER — Observation Stay (HOSPITAL_COMMUNITY)
Admission: EM | Admit: 2015-06-06 | Discharge: 2015-06-07 | Disposition: A | Discharge status: Home / Self Care | Payer: Medicare Other | Attending: Internal Medicine | Admitting: Internal Medicine

## 2015-06-06 DIAGNOSIS — Z7982 Long term (current) use of aspirin: Secondary | ICD-10-CM | POA: Diagnosis not present

## 2015-06-06 DIAGNOSIS — G939 Disorder of brain, unspecified: Secondary | ICD-10-CM | POA: Diagnosis not present

## 2015-06-06 DIAGNOSIS — R404 Transient alteration of awareness: Secondary | ICD-10-CM | POA: Diagnosis not present

## 2015-06-06 DIAGNOSIS — E1140 Type 2 diabetes mellitus with diabetic neuropathy, unspecified: Secondary | ICD-10-CM | POA: Diagnosis not present

## 2015-06-06 DIAGNOSIS — R1310 Dysphagia, unspecified: Secondary | ICD-10-CM | POA: Diagnosis not present

## 2015-06-06 DIAGNOSIS — G40909 Epilepsy, unspecified, not intractable, without status epilepticus: Secondary | ICD-10-CM | POA: Diagnosis not present

## 2015-06-06 DIAGNOSIS — I10 Essential (primary) hypertension: Secondary | ICD-10-CM | POA: Insufficient documentation

## 2015-06-06 DIAGNOSIS — E119 Type 2 diabetes mellitus without complications: Secondary | ICD-10-CM

## 2015-06-06 DIAGNOSIS — R4182 Altered mental status, unspecified: Secondary | ICD-10-CM

## 2015-06-06 DIAGNOSIS — E785 Hyperlipidemia, unspecified: Secondary | ICD-10-CM | POA: Insufficient documentation

## 2015-06-06 DIAGNOSIS — R339 Retention of urine, unspecified: Secondary | ICD-10-CM | POA: Insufficient documentation

## 2015-06-06 DIAGNOSIS — R5383 Other fatigue: Secondary | ICD-10-CM | POA: Diagnosis not present

## 2015-06-06 DIAGNOSIS — F39 Unspecified mood [affective] disorder: Secondary | ICD-10-CM

## 2015-06-06 DIAGNOSIS — R4020 Unspecified coma: Secondary | ICD-10-CM | POA: Diagnosis not present

## 2015-06-06 DIAGNOSIS — E876 Hypokalemia: Secondary | ICD-10-CM | POA: Diagnosis not present

## 2015-06-06 DIAGNOSIS — I6789 Other cerebrovascular disease: Secondary | ICD-10-CM | POA: Diagnosis not present

## 2015-06-06 DIAGNOSIS — R531 Weakness: Secondary | ICD-10-CM | POA: Insufficient documentation

## 2015-06-06 DIAGNOSIS — R9401 Abnormal electroencephalogram [EEG]: Secondary | ICD-10-CM | POA: Diagnosis not present

## 2015-06-06 DIAGNOSIS — Z87891 Personal history of nicotine dependence: Secondary | ICD-10-CM | POA: Diagnosis not present

## 2015-06-06 DIAGNOSIS — E1142 Type 2 diabetes mellitus with diabetic polyneuropathy: Secondary | ICD-10-CM

## 2015-06-06 DIAGNOSIS — R55 Syncope and collapse: Secondary | ICD-10-CM

## 2015-06-06 DIAGNOSIS — Z794 Long term (current) use of insulin: Secondary | ICD-10-CM | POA: Diagnosis not present

## 2015-06-06 DIAGNOSIS — G9340 Encephalopathy, unspecified: Principal | ICD-10-CM | POA: Insufficient documentation

## 2015-06-06 DIAGNOSIS — I159 Secondary hypertension, unspecified: Secondary | ICD-10-CM

## 2015-06-06 DIAGNOSIS — F4489 Other dissociative and conversion disorders: Secondary | ICD-10-CM | POA: Diagnosis not present

## 2015-06-06 DIAGNOSIS — R401 Stupor: Secondary | ICD-10-CM | POA: Diagnosis not present

## 2015-06-06 DIAGNOSIS — R410 Disorientation, unspecified: Secondary | ICD-10-CM | POA: Diagnosis not present

## 2015-06-06 HISTORY — DX: Hyperlipidemia, unspecified: E78.5

## 2015-06-06 HISTORY — DX: Unspecified mood (affective) disorder: F39

## 2015-06-06 HISTORY — DX: Type 2 diabetes mellitus without complications: E11.9

## 2015-06-06 HISTORY — DX: Type 2 diabetes mellitus with diabetic neuropathy, unspecified: E11.40

## 2015-06-06 HISTORY — DX: Essential (primary) hypertension: I10

## 2015-06-06 LAB — DIFFERENTIAL
Basophils Absolute: 0 10*3/uL (ref 0.0–0.1)
Basophils Relative: 0 % (ref 0–1)
Eosinophils Absolute: 0 10*3/uL (ref 0.0–0.7)
Eosinophils Relative: 0 % (ref 0–5)
Lymphocytes Relative: 45 % (ref 12–46)
Lymphs Abs: 2.2 10*3/uL (ref 0.7–4.0)
Monocytes Absolute: 0.3 10*3/uL (ref 0.1–1.0)
Monocytes Relative: 6 % (ref 3–12)
Neutro Abs: 2.4 10*3/uL (ref 1.7–7.7)
Neutrophils Relative %: 49 % (ref 43–77)

## 2015-06-06 LAB — URINALYSIS, ROUTINE W REFLEX MICROSCOPIC
Bilirubin Urine: NEGATIVE
Glucose, UA: 500 mg/dL — AB
Hgb urine dipstick: NEGATIVE
Ketones, ur: NEGATIVE mg/dL
Leukocytes, UA: NEGATIVE
Nitrite: NEGATIVE
Protein, ur: NEGATIVE mg/dL
Specific Gravity, Urine: 1.019 (ref 1.005–1.030)
Urobilinogen, UA: 0.2 mg/dL (ref 0.0–1.0)
pH: 5 (ref 5.0–8.0)

## 2015-06-06 LAB — APTT: aPTT: 26 seconds (ref 24–37)

## 2015-06-06 LAB — COMPREHENSIVE METABOLIC PANEL
ALT: 11 U/L — ABNORMAL LOW (ref 14–54)
AST: 16 U/L (ref 15–41)
Albumin: 3.9 g/dL (ref 3.5–5.0)
Alkaline Phosphatase: 150 U/L — ABNORMAL HIGH (ref 38–126)
Anion gap: 12 (ref 5–15)
BUN: 18 mg/dL (ref 6–20)
CO2: 24 mmol/L (ref 22–32)
Calcium: 9.2 mg/dL (ref 8.9–10.3)
Chloride: 101 mmol/L (ref 101–111)
Creatinine, Ser: 1.18 mg/dL — ABNORMAL HIGH (ref 0.44–1.00)
GFR calc Af Amer: 57 mL/min — ABNORMAL LOW (ref >60)
GFR calc non Af Amer: 49 mL/min — ABNORMAL LOW (ref >60)
Glucose, Bld: 267 mg/dL — ABNORMAL HIGH (ref 65–99)
Potassium: 2.9 mmol/L — ABNORMAL LOW (ref 3.5–5.1)
Sodium: 137 mmol/L (ref 135–145)
Total Bilirubin: 0.6 mg/dL (ref 0.3–1.2)
Total Protein: 7.7 g/dL (ref 6.5–8.1)

## 2015-06-06 LAB — PROTIME-INR
INR: 1 (ref 0.00–1.49)
Prothrombin Time: 13.4 seconds (ref 11.6–15.2)

## 2015-06-06 LAB — GLUCOSE, CAPILLARY
Glucose-Capillary: 313 mg/dL — ABNORMAL HIGH (ref 65–99)
Glucose-Capillary: 314 mg/dL — ABNORMAL HIGH (ref 65–99)
Glucose-Capillary: 137 mg/dL — ABNORMAL HIGH (ref 65–99)
Glucose-Capillary: 336 mg/dL — ABNORMAL HIGH (ref 65–99)

## 2015-06-06 LAB — CBC
HCT: 33.3 % — ABNORMAL LOW (ref 36.0–46.0)
Hemoglobin: 11.5 g/dL — ABNORMAL LOW (ref 12.0–15.0)
MCH: 29 pg (ref 26.0–34.0)
MCHC: 34.5 g/dL (ref 30.0–36.0)
MCV: 84.1 fL (ref 78.0–100.0)
Platelets: 238 10*3/uL (ref 150–400)
RBC: 3.96 MIL/uL (ref 3.87–5.11)
RDW: 12.3 % (ref 11.5–15.5)
WBC: 4.9 10*3/uL (ref 4.0–10.5)

## 2015-06-06 LAB — RAPID URINE DRUG SCREEN, HOSP PERFORMED
Amphetamines: NOT DETECTED
Barbiturates: NOT DETECTED
Benzodiazepines: NOT DETECTED
Cocaine: NOT DETECTED
Opiates: NOT DETECTED
Tetrahydrocannabinol: NOT DETECTED

## 2015-06-06 LAB — FOLATE: Folate: 16.6 ng/mL (ref >5.9)

## 2015-06-06 LAB — I-STAT CHEM 8, ED
BUN: 20 mg/dL (ref 6–20)
Calcium, Ion: 1.12 mmol/L (ref 1.12–1.23)
Chloride: 102 mmol/L (ref 101–111)
Creatinine, Ser: 1.1 mg/dL — ABNORMAL HIGH (ref 0.44–1.00)
Glucose, Bld: 268 mg/dL — ABNORMAL HIGH (ref 65–99)
HCT: 36 % (ref 36.0–46.0)
Hemoglobin: 12.2 g/dL (ref 12.0–15.0)
Potassium: 3 mmol/L — ABNORMAL LOW (ref 3.5–5.1)
Sodium: 140 mmol/L (ref 135–145)
TCO2: 23 mmol/L (ref 0–100)

## 2015-06-06 LAB — AMMONIA: Ammonia: 14 umol/L (ref 9–35)

## 2015-06-06 LAB — ETHANOL: Alcohol, Ethyl (B): <5 mg/dL (ref <5)

## 2015-06-06 LAB — I-STAT TROPONIN, ED: Troponin i, poc: 0 ng/mL (ref 0.00–0.08)

## 2015-06-06 LAB — TSH: TSH: 0.212 u[IU]/mL — ABNORMAL LOW (ref 0.350–4.500)

## 2015-06-06 LAB — I-STAT CG4 LACTIC ACID, ED: Lactic Acid, Venous: 1.73 mmol/L (ref 0.5–2.0)

## 2015-06-06 LAB — T4, FREE: Free T4: 0.97 ng/dL (ref 0.61–1.12)

## 2015-06-06 LAB — VITAMIN B12: Vitamin B-12: 856 pg/mL (ref 180–914)

## 2015-06-06 MED ORDER — POTASSIUM CHLORIDE CRYS ER 20 MEQ PO TBCR
40.0000 meq | EXTENDED_RELEASE_TABLET | Freq: Two times a day (BID) | ORAL | Status: AC
  Administered 2015-06-06 (×2): 40 meq via ORAL
  Filled 2015-06-06 (×2): qty 2

## 2015-06-06 MED ORDER — GABAPENTIN 600 MG PO TABS
600.0000 mg | ORAL_TABLET | Freq: Two times a day (BID) | ORAL | Status: DC
  Administered 2015-06-06 – 2015-06-07 (×3): 600 mg via ORAL
  Filled 2015-06-06 (×4): qty 1

## 2015-06-06 MED ORDER — HEPARIN SODIUM (PORCINE) 5000 UNIT/ML IJ SOLN
5000.0000 [IU] | Freq: Three times a day (TID) | INTRAMUSCULAR | Status: DC
  Administered 2015-06-06 – 2015-06-07 (×3): 5000 [IU] via SUBCUTANEOUS
  Filled 2015-06-06 (×5): qty 1

## 2015-06-06 MED ORDER — HYDRALAZINE HCL 20 MG/ML IJ SOLN
10.0000 mg | Freq: Once | INTRAMUSCULAR | Status: AC
  Administered 2015-06-06: 10 mg via INTRAVENOUS
  Filled 2015-06-06: qty 1

## 2015-06-06 MED ORDER — ATORVASTATIN CALCIUM 40 MG PO TABS
40.0000 mg | ORAL_TABLET | Freq: Every day | ORAL | Status: DC
  Administered 2015-06-06 – 2015-06-07 (×2): 40 mg via ORAL
  Filled 2015-06-06 (×2): qty 1

## 2015-06-06 MED ORDER — METOPROLOL SUCCINATE ER 50 MG PO TB24
50.0000 mg | ORAL_TABLET | Freq: Every day | ORAL | Status: DC
  Administered 2015-06-06: 50 mg via ORAL
  Filled 2015-06-06 (×2): qty 1

## 2015-06-06 MED ORDER — INSULIN ASPART 100 UNIT/ML ~~LOC~~ SOLN
0.0000 [IU] | Freq: Four times a day (QID) | SUBCUTANEOUS | Status: DC
  Administered 2015-06-06: 11 [IU] via SUBCUTANEOUS

## 2015-06-06 MED ORDER — HYDROCHLOROTHIAZIDE 25 MG PO TABS
25.0000 mg | ORAL_TABLET | Freq: Every day | ORAL | Status: DC
  Administered 2015-06-06 – 2015-06-07 (×2): 25 mg via ORAL
  Filled 2015-06-06 (×3): qty 1

## 2015-06-06 MED ORDER — SUCRALFATE 1 G PO TABS
1.0000 g | ORAL_TABLET | Freq: Three times a day (TID) | ORAL | Status: DC
  Administered 2015-06-06 – 2015-06-07 (×6): 1 g via ORAL
  Filled 2015-06-06 (×9): qty 1

## 2015-06-06 MED ORDER — IRBESARTAN 300 MG PO TABS
300.0000 mg | ORAL_TABLET | Freq: Every day | ORAL | Status: DC
  Administered 2015-06-06 – 2015-06-07 (×2): 300 mg via ORAL
  Filled 2015-06-06 (×2): qty 1

## 2015-06-06 MED ORDER — ASPIRIN 325 MG PO TABS
325.0000 mg | ORAL_TABLET | Freq: Every day | ORAL | Status: DC
  Administered 2015-06-06 – 2015-06-07 (×2): 325 mg via ORAL
  Filled 2015-06-06 (×2): qty 1

## 2015-06-06 MED ORDER — AMLODIPINE-OLMESARTAN 5-40 MG PO TABS: 1.0000 | ORAL_TABLET | Freq: Every day | ORAL | Status: DC

## 2015-06-06 MED ORDER — FAMOTIDINE 20 MG PO TABS
20.0000 mg | ORAL_TABLET | Freq: Every day | ORAL | Status: DC
  Administered 2015-06-06 – 2015-06-07 (×2): 20 mg via ORAL
  Filled 2015-06-06 (×3): qty 1

## 2015-06-06 MED ORDER — ASPIRIN 300 MG RE SUPP
300.0000 mg | Freq: Every day | RECTAL | Status: DC
  Filled 2015-06-06 (×2): qty 1

## 2015-06-06 MED ORDER — INSULIN ASPART 100 UNIT/ML ~~LOC~~ SOLN
0.0000 [IU] | Freq: Three times a day (TID) | SUBCUTANEOUS | Status: DC
  Administered 2015-06-06: 11 [IU] via SUBCUTANEOUS
  Administered 2015-06-06: 2 [IU] via SUBCUTANEOUS
  Administered 2015-06-06: 11 [IU] via SUBCUTANEOUS
  Administered 2015-06-07: 3 [IU] via SUBCUTANEOUS
  Administered 2015-06-07: 2 [IU] via SUBCUTANEOUS

## 2015-06-06 MED ORDER — INSULIN GLARGINE 100 UNIT/ML ~~LOC~~ SOLN
15.0000 [IU] | Freq: Every day | SUBCUTANEOUS | Status: DC
  Administered 2015-06-06: 15 [IU] via SUBCUTANEOUS
  Filled 2015-06-06 (×2): qty 0.15

## 2015-06-06 MED ORDER — AMLODIPINE BESYLATE 5 MG PO TABS
5.0000 mg | ORAL_TABLET | Freq: Every day | ORAL | Status: DC
  Administered 2015-06-06 – 2015-06-07 (×2): 5 mg via ORAL
  Filled 2015-06-06 (×2): qty 1

## 2015-06-06 MED ORDER — AMITRIPTYLINE HCL 50 MG PO TABS
50.0000 mg | ORAL_TABLET | Freq: Every day | ORAL | Status: DC
  Administered 2015-06-06: 50 mg via ORAL
  Filled 2015-06-06 (×2): qty 1

## 2015-06-06 NOTE — Progress Notes (Addendum)
Patient states being unable to void since working with PT earlier today. Pt walked to bathroom, states unable to void. Bladder scan >412 mL. Schorr, NP notified.   9:11pm telephone readback order given for in and out cath one time by schorr, np

## 2015-06-06 NOTE — Evaluation (Signed)
Clinical/Bedside Swallow Evaluation Patient Details  Name: Tricia Huynh MRN: [id number] Date of Birth: Jul 19, 1956  Today's Date: 06/06/2015 Time: SLP Start Time (ACUTE ONLY): 0755 SLP Stop Time (ACUTE ONLY): 0810 SLP Time Calculation (min) (ACUTE ONLY): 15 min  Past Medical History:  Past Medical History  Diagnosis Date  . Diabetes mellitus without complication   . Hypertension   . Mood disorder 06/06/2015  . Essential hypertension 06/06/2015  . Dyslipidemia 06/06/2015  . Diabetic neuropathy 06/06/2015  . Diabetes mellitus type 2 in nonobese 06/06/2015   Past Surgical History: History reviewed. No pertinent past surgical history. HPI:  59 yo female adm to [facility] after being found unresponsive.  PMH + for DM, HTN.  Pt underwent a CXR 7/16 secondary to chest pain - which was negative.  MRI negative.  Pt admits to mild left sided weakness.  She admits to some dysphagia to large pills stating they make her gag.    Assessment / Plan / Recommendation Clinical Impression  Pt presents with functional oropharyngeal swallow - no s/s of aspiration with po nor focal CN deficits.  Pt admits to premorbid large pill dysphagia - c/b gagging with pills.  SLP educated pt to alternatives to pill administration -including using applesauce/puree or taking with food bolus.  Pt has no lower dentition - dentures lost per pt and eats softer foods at home.  Recommend diet as tolerated given pt intact awareness.  No follow up re: swallow ability.      Aspiration Risk    minimal   Diet Recommendation Age appropriate regular solids;Thin   Medication Administration: Whole meds with liquid (large pills with pudding) Compensations: Slow rate;Small sips/bites    Other  Recommendations Oral Care Recommendations: Oral care BID   Follow Up Recommendations    none re: swallowing   Frequency and Duration        Pertinent Vitals/Pain Afebrile, decreased      Swallow Study Prior Functional Status   see hhx     General Date of Onset: 06/06/15 Other Pertinent Information: 59 yo female adm to [facility] after being found unresponsive.  PMH + for DM, HTN.  Pt underwent a CXR 7/16 secondary to chest pain - which was negative.  MRI negative.  Pt admits to mild left sided weakness.  She admits to some dysphagia to large pills stating they make her gag.  Type of Study: Bedside swallow evaluation Diet Prior to this Study: NPO Temperature Spikes Noted: No Respiratory Status: Room air History of Recent Intubation: No Behavior/Cognition: Alert;Cooperative;Pleasant mood Oral Cavity - Dentition: Missing dentition (upper denture, lower was lost per pt) Self-Feeding Abilities: Able to feed self Patient Positioning: Upright in bed Baseline Vocal Quality: Low vocal intensity (pt reports this to be baseline) Volitional Cough: Strong Volitional Swallow: Able to elicit    Oral/Motor/Sensory Function Overall Oral Motor/Sensory Function: Appears within functional limits for tasks assessed   Ice Chips Ice chips: Within functional limits Presentation: Spoon   Thin Liquid Thin Liquid: Within functional limits Presentation: Straw;Cup    Nectar Thick Nectar Thick Liquid: Not tested   Honey Thick Honey Thick Liquid: Not tested   Puree Puree: Within functional limits Presentation: Self Fed;Spoon   Solid   GO Functional Assessment Tool Used: clinical judgement Functional Limitations: Swallowing Swallow Current Status [id number]): At least 1 percent but less than 20 percent impaired, limited or restricted Swallow Goal Status [phone]): At least 1 percent but less than 20 percent impaired, limited or restricted Swallow Discharge Status [phone]): At least  1 percent but less than 20 percent impaired, limited or restricted  Solid: Within functional limits Presentation: Self Fed Other Comments: slow but effective mastication- no significant oral residuals       Tricia Huynh, [place] [facility] SLP [phone]

## 2015-06-06 NOTE — ED Notes (Signed)
Pt defecated and was cleaned with soap and water by Lavella Lemons - EMT and Erin - EMT. Pt's bottom linen and chux were changed, also.

## 2015-06-06 NOTE — Progress Notes (Signed)
Chaplain attempted to see pt re: completion of advanced directive.  Pt with PT and out of room.  Chaplain will follow up later.    06/06/15 1100  Clinical Encounter Type  Visited With Patient not available  Advance Directives (For Healthcare)  Does patient have an advance directive? No   Geralyn Flash 06/06/2015 11:16 AM

## 2015-06-06 NOTE — Consult Note (Signed)
Stroke Consult    Chief Complaint: altered mental status  HPI: Tricia Huynh is an 59 y.o. female hx of HTN, DM presenting with acute onset of altered mental status. Per family, they last saw her normal at midnight when she went to bed. Shortly after heard a noise and found her unresponsive on the toilet. Noted bowel and bladder incontinence. No abnormal movements noted. Was lethargic, confused with question of left arm weakness.   CT head imaging reviewed, no acute process.   Date last known well: 7/19 Time last known well: 2400 tPA Given: no, not consistent with stroke, DWI negative Modified Rankin: Rankin Score=0  Past Medical History  Diagnosis Date  . Diabetes mellitus without complication   . Hypertension     History reviewed. No pertinent past surgical history.  History reviewed. No pertinent family history. Social History:  reports that she has quit smoking. She has never used smokeless tobacco. She reports that she does not drink alcohol or use illicit drugs.  Allergies:  Allergies  Allergen Reactions  . Percocet [Oxycodone-Acetaminophen] Nausea And Vomiting and Other (See Comments)    Shaky/unsteady.  . Vicodin [Hydrocodone-Acetaminophen] Nausea And Vomiting and Other (See Comments)    Shaky/unsteady.     (Not in a hospital admission)  ROS: Out of a complete 14 system review, the patient complains of only the following symptoms, and all other reviewed systems are negative. + altered mental status  Physical Examination: There were no vitals filed for this visit. Physical Exam  Constitutional: He appears well-developed and well-nourished.  Psych: Affect appropriate to situation Eyes: No scleral injection HENT: No OP obstrucion Head: Normocephalic.  Cardiovascular: Normal rate and regular rhythm.  Respiratory: Effort normal and breath sounds normal.  GI: Soft. Bowel sounds are normal. No distension. There is no tenderness.  Skin: WDI  Neurologic  Examination: Mental Status: Lethargic, will open eyes to voice, will say "no", oriented x 0. Will nod yes/no appropriately. Not following commands.   Cranial Nerves: II: optic discs not visualize, blinks to threat bilateral, pupils equal, round, reactive to light III,IV, VI: ptosis not present, not following finger, no gaze deviation V,VII: face symmetric, facial light touch sensation normal bilaterally VIII: hearing normal bilaterally IX,X: gag reflex present XI: unable to test XII: unable to test Motor: Unable to formally test due to mental status but appears to move all extremities against gravity and light resistance Tone and bulk:normal tone throughout; no atrophy noted Sensory: withdrawals to noxious stimuli in all 4 extremities Deep Tendon Reflexes: 2+ and symmetric throughout Plantars: Right: downgoing   Left: downgoing Cerebellar: Unable to test Gait: unable to best  Laboratory Studies:   Basic Metabolic Panel:  Recent Labs Lab 06/02/15 0147  NA 135  K 3.5  CL 100*  CO2 25  GLUCOSE 338*  BUN 17  CREATININE 0.98  CALCIUM 9.7    Liver Function Tests: No results for input(s): AST, ALT, ALKPHOS, BILITOT, PROT, ALBUMIN in the last 168 hours. No results for input(s): LIPASE, AMYLASE in the last 168 hours. No results for input(s): AMMONIA in the last 168 hours.  CBC:  Recent Labs Lab 06/02/15 0147  WBC 5.0  HGB 11.5*  HCT 33.2*  MCV 84.1  PLT 253    Cardiac Enzymes: No results for input(s): CKTOTAL, CKMB, CKMBINDEX, TROPONINI in the last 168 hours.  BNP: Invalid input(s): POCBNP  CBG: No results for input(s): GLUCAP in the last 168 hours.  Microbiology: No results found for this or any previous visit.  Coagulation Studies: No results for input(s): LABPROT, INR in the last 72 hours.  Urinalysis: No results for input(s): COLORURINE, LABSPEC, PHURINE, GLUCOSEU, HGBUR, BILIRUBINUR, KETONESUR, PROTEINUR, UROBILINOGEN, NITRITE, LEUKOCYTESUR in the  last 168 hours.  Invalid input(s): APPERANCEUR  Lipid Panel:  No results found for: CHOL, TRIG, HDL, CHOLHDL, VLDL, LDLCALC  HgbA1C: No results found for: HGBA1C  Urine Drug Screen:  No results found for: LABOPIA, COCAINSCRNUR, LABBENZ, AMPHETMU, THCU, LABBARB  Alcohol Level: No results for input(s): ETH in the last 168 hours.    Imaging: No results found.  Assessment: 59 y.o. female with hx of DM and HTN presenting to the ED after being found down unresponsive with bowel/bladder incontinence. Unclear etiology of presentation. DWI imaging negative for acute stroke. With history would consider seizure with post ictal confusion vs unclear metabolic etiology.    Plan: 1. Complete remainder of MRI brain 2. Urine drug screen, ethanol level, ammonia, TSH 3. Carotid ultrasound 4. EEG 5. Will continue to follow   Jim Like, DO [facility] [phone]  If 7pm- 7am, please page neurology on call as listed in [place]. 06/06/2015, 2:52 AM

## 2015-06-06 NOTE — ED Provider Notes (Addendum)
CSN: [id number]     Arrival date & time 06/06/15  0223 History  This chart was scribed for Linton Flemings, MD by Chester Holstein, ED Scribe. This patient was seen in room A04C/A04C and the patient's care was started at 2:25 AM.    Chief Complaint  Patient presents with  . Altered Mental Status   LEVEL 5 CAVEAT- ALTERED MENTAL STATUS  The history is provided by the EMS personnel, a relative and medical records. The history is limited by the condition of the patient. No language interpreter was used.   HPI Comments: Tricia Huynh is a 59 y.o. female brought in by ambulance, with PMHx of DM and HTN who presents to the Emergency Department complaining of LOC and AMS just PTA. Per EMS pt had syncopal episode while using restroom. Daughter saw pt get up to use restroom and then heard a noise and found pt unresponsive in the bathroom. Family denies h/o seizure. Pt presenting lethargic, alert but nonverbal and confused with left arm weakness and incontinence. She was last seen at baseline around 12 AM. Pt is allergic to Vicodin and Percocet. Pt was last seen in ED on 06/02/15 for chest pain. Pt's troponin and EKG showed no significant cardiac changes.  Past Medical History  Diagnosis Date  . Diabetes mellitus without complication   . Hypertension    History reviewed. No pertinent past surgical history. History reviewed. No pertinent family history. History  Substance Use Topics  . Smoking status: Former Research scientist (life sciences)  . Smokeless tobacco: Never Used  . Alcohol Use: No   OB History    No data available     Review of Systems  Unable to perform ROS: Acuity of condition      Allergies  Percocet and Vicodin  Home Medications   Prior to Admission medications   Medication Sig Start Date End Date Taking? Authorizing Provider  amitriptyline (ELAVIL) 50 MG tablet Take 50 mg by mouth at bedtime. 05/08/15   Historical Provider, MD  amLODipine-olmesartan (AZOR) 5-40 MG per tablet Take 1 tablet by mouth  daily.    Historical Provider, MD  aspirin-acetaminophen-caffeine (EXCEDRIN MIGRAINE) [phone] MG per tablet Take 2 tablets by mouth every 6 (six) hours as needed for headache.    Historical Provider, MD  atorvastatin (LIPITOR) 40 MG tablet Take 40 mg by mouth daily. 05/10/15   Historical Provider, MD  gabapentin (NEURONTIN) 600 MG tablet Take 600 mg by mouth 2 (two) times daily.  10/08/14   Historical Provider, MD  hydrochlorothiazide (HYDRODIURIL) 25 MG tablet Take 1 tablet (25 mg total) by mouth daily. 05/22/15   Hannah Muthersbaugh, PA-C  LANTUS SOLOSTAR 100 UNIT/ML Solostar Pen Inject 20-50 Units into the skin at bedtime.  10/08/14   Historical Provider, MD  metFORMIN (GLUCOPHAGE) 500 MG tablet Take 500 mg by mouth 2 (two) times daily. 10/09/14   Historical Provider, MD  metoprolol succinate (TOPROL-XL) 25 MG 24 hr tablet Take 50 mg by mouth at bedtime. 05/08/15   Historical Provider, MD  NOVOLOG FLEXPEN 100 UNIT/ML FlexPen Inject 20-50 Units into the skin 2 (two) times daily. 50 units in am and 20 units in pm 10/08/14   Historical Provider, MD  ranitidine (ZANTAC) 150 MG tablet Take 1 tablet (150 mg total) by mouth 2 (two) times daily. 10/18/14   Blanchie Dessert, MD  sucralfate (CARAFATE) 1 G tablet Take 1 tablet (1 g total) by mouth 4 (four) times daily -  with meals and at bedtime. 10/18/14   Blanchie Dessert, MD  Vitamin D, Ergocalciferol, (DRISDOL) 50000 UNITS CAPS capsule Take 50,000 Units by mouth every 7 (seven) days. On Mondays    Historical Provider, MD   There were no vitals taken for this visit. Physical Exam  Constitutional: She appears well-developed and well-nourished. No distress.  Patient appears older than stated age  HENT:  Head: Normocephalic and atraumatic.  Nose: Nose normal.  Mouth/Throat: Oropharynx is clear and moist.  Eyes: Conjunctivae and EOM are normal. Pupils are equal, round, and reactive to light.  Neck: Normal range of motion. Neck supple. No JVD present. No  tracheal deviation present. No thyromegaly present.  Cardiovascular: Normal rate, regular rhythm, normal heart sounds and intact distal pulses.  Exam reveals no gallop and no friction rub.   No murmur heard. Pulmonary/Chest: Effort normal and breath sounds normal. No stridor. No respiratory distress. She has no wheezes. She has no rales. She exhibits no tenderness.  Abdominal: Soft. Bowel sounds are normal. She exhibits no distension and no mass. There is no tenderness. There is no rebound and no guarding.  Musculoskeletal: Normal range of motion. She exhibits no edema or tenderness.  Lymphadenopathy:    She has no cervical adenopathy.  Neurological: She displays normal reflexes. No cranial nerve deficit. She exhibits abnormal muscle tone. Coordination abnormal.  Patient is aphasic on my exam.  Occasionally mouthing words, but no sounds.  Patient withdraws briskly from pain on right arm, but no response to left arm or feet irritation.  Patient will not follow commands.  Skin: Skin is warm and dry. No rash noted. No erythema. No pallor.  Nursing note and vitals reviewed.   ED Course  Procedures (including critical care time) DIAGNOSTIC STUDIES: Oxygen Saturation is 100% on room air, normal by my interpretation.    COORDINATION OF CARE: 2:39 AM Discussed treatment plan with patient at beside, the patient agrees with the plan and has no further questions at this time.   Labs Review Labs Reviewed  CBC - Abnormal; Notable for the following:    Hemoglobin 11.5 (*)    HCT 33.3 (*)    All other components within normal limits  COMPREHENSIVE METABOLIC PANEL - Abnormal; Notable for the following:    Potassium 2.9 (*)    Glucose, Bld 267 (*)    Creatinine, Ser 1.18 (*)    ALT 11 (*)    Alkaline Phosphatase 150 (*)    GFR calc non Af Amer 49 (*)    GFR calc Af Amer 57 (*)    All other components within normal limits  URINALYSIS, ROUTINE W REFLEX MICROSCOPIC (NOT AT [facility]) - Abnormal; Notable  for the following:    Glucose, UA 500 (*)    All other components within normal limits  I-STAT CHEM 8, ED - Abnormal; Notable for the following:    Potassium 3.0 (*)    Creatinine, Ser 1.10 (*)    Glucose, Bld 268 (*)    All other components within normal limits  ETHANOL  PROTIME-INR  APTT  DIFFERENTIAL  URINE RAPID DRUG SCREEN, HOSP PERFORMED  I-STAT TROPOININ, ED  I-STAT CG4 LACTIC ACID, ED    Imaging Review Ct Head Wo Contrast  06/06/2015   CLINICAL DATA:  Acute onset of loss of consciousness. Found unresponsive. Lethargy and confusion. Left arm weakness and incontinence. Initial encounter.  EXAM: CT HEAD WITHOUT CONTRAST  TECHNIQUE: Contiguous axial images were obtained from the base of the skull through the vertex without intravenous contrast.  COMPARISON:  CT of the head performed  05/26/2015  FINDINGS: There is no evidence of acute infarction, mass lesion, or intra- or extra-axial hemorrhage on CT.  Prominence of the sulci suggests mild cortical volume loss. Mild cerebellar atrophy is noted.  The brainstem and fourth ventricle are within normal limits. The basal ganglia are unremarkable in appearance. The cerebral hemispheres demonstrate grossly normal gray-white differentiation. No mass effect or midline shift is seen.  There is no evidence of fracture; there is a 2.6 cm focus of mixed fat and fluid attenuation within the soft tissues at the left occiput, with mild associated calvarial thinning. The visualized portions of the orbits are within normal limits. The paranasal sinuses and mastoid air cells are well-aerated. No significant soft tissue abnormalities are seen.  IMPRESSION: 1. No acute intracranial pathology seen on CT. 2. Mild cortical volume loss noted. 3. 2.6 cm focus of mixed fat and fluid attenuation within the soft tissues at the left occiput, with mild associated calvarial thinning. Given its appearance, this is likely benign, though would correlate clinically for associated  symptoms. These results were called by telephone at the time of interpretation on 06/06/2015 at 2:59 am to Dr. Janann Colonel, who verbally acknowledged these results.   Electronically Signed   By: Garald Balding M.D.   On: 06/06/2015 03:00   Mr Brain Wo Contrast  06/06/2015   CLINICAL DATA:  Initial evaluation for acute altered mental status.  EXAM: MRI HEAD WITHOUT CONTRAST  TECHNIQUE: Multiplanar, multiecho pulse sequences of the brain and surrounding structures were obtained without intravenous contrast.  COMPARISON:  Prior CT from earlier the same day.  FINDINGS: Mild diffuse prominence of the CSF containing spaces is compatible with generalized age-related cerebral atrophy. A few scattered T2/FLAIR hyperintense foci noted within the deep white matter of both cerebral hemispheres, nonspecific, and felt to be within normal limits for patient age.  No abnormal foci of restricted diffusion to suggest acute intracranial infarct identified. Gray-white matter differentiation maintained. Normal intravascular flow voids are preserved. No acute or chronic intracranial hemorrhage.  No mass lesion, midline shift, or mass effect. No hydrocephalus. No extra-axial fluid collection.  Craniocervical junction within normal limits. Mild degenerative changes noted within the visualized upper cervical spine. Pituitary gland normal.  No acute abnormality about the orbits. Sequelae of prior bilateral lens extraction noted.  Mild mucosal thickening within the ethmoidal air cells. Visualized paranasal sinuses are otherwise clear. No mastoid effusion. Inner ear structures normal.  Bone marrow signal intensity within normal limits.  Well-circumscribed 2.5 cm ovoid lesion present within the subcutaneous tissues of the left occipital scalp (series 6, image 8). This lesion is indeterminate, but most likely benign. Scalp soft tissues are otherwise unremarkable.  IMPRESSION: 1. No acute intracranial infarct or other abnormality identified. 2. Mild  age-related cerebral atrophy. 3. 2.5 cm well-circumscribed ovoid lesion within the left occipital scalp. This lesion is indeterminate, but most certainly benign. Correlation with physical exam and symptomatology recommended.   Electronically Signed   By: Jeannine Boga M.D.   On: 06/06/2015 04:00     EKG Interpretation   Date/Time:  Wednesday June 06 2015 05:03:11 EDT Ventricular Rate:  69 PR Interval:  155 QRS Duration: 86 QT Interval:  417 QTC Calculation: 447 R Axis:   6 Text Interpretation:  Sinus rhythm Abnormal R-wave progression, early  transition Borderline T wave abnormalities Baseline wander in lead(s) V2  Confirmed by Seabron Iannello  MD, Wyvonne Carda ([id number]) on 06/06/2015 6:10:56 AM     CRITICAL CARE Performed by: Linton Flemings, MD Total critical  care time:60 min Critical care time was exclusive of separately billable procedures and treating other patients. Critical care was necessary to treat or prevent imminent or life-threatening deterioration. Critical care was time spent personally by me on the following activities: development of treatment plan with patient and/or surrogate as well as nursing, discussions with consultants, evaluation of patient's response to treatment, examination of patient, obtaining history from patient or surrogate, ordering and performing treatments and interventions, ordering and review of laboratory studies, ordering and review of radiographic studies, pulse oximetry and re-evaluation of patient's condition.   MDM   Final diagnoses:  Acute encephalopathy    I personally performed the services described in this documentation, which was scribed in my presence. The recorded information has been reviewed and is accurate.  59 year old female with altered mental status.  Patient initially called as a code stroke due to a phase and difficulties following commands, weakness on left side.  Head CT without acute intracranial abnormality.  Neurology, Dr. Janann Colonel at the  bedside.  Patient is inconsistent in her neuro exam.  Dr. Janann Colonel recommended MRI which has been completed.  Also unremarkable.  Labs do not show a specific metabolic cause for her altered mental status.  No history of seizure, and she has been in a prolonged confusional states that she's been here in the emergency department.  Less likely to be a postictal state.  Patient to be admitted to the hospital for further workup.  Current workup and plan discussed with family at the bedside.    Linton Flemings, MD 06/06/15 [id number]  Linton Flemings, MD 06/06/15 [id number]

## 2015-06-06 NOTE — Evaluation (Signed)
Physical Therapy Evaluation Patient Details Name: Tricia Huynh MRN: [id number] DOB: [birth year] Today's Date: 06/06/2015   History of Present Illness  59 yo female adm to [facility] after being found unresponsive. PMH + for DM, HTN. Pt underwent a CXR 7/16 secondary to chest pain - which was negative. MRI negative. Pt admits to mild left sided weakness.  Clinical Impression  Pt admitted with above diagnosis. Pt currently with functional limitations due to the deficits listed below (see PT Problem List).  Pt will benefit from skilled PT to increase their independence and safety with mobility to allow discharge to the venue listed below.       Follow Up Recommendations No PT follow up;Other (comment) (Worth considering Outpt PT if balance deficits persist)    Equipment Recommendations       Recommendations for Other Services       Precautions / Restrictions Precautions Precautions: Fall Precaution Comments: Fall risk significantly decreased with use of hallway rails, or assistive device for UE support Restrictions Weight Bearing Restrictions: No      Mobility  Bed Mobility Overal bed mobility: Independent                Transfers Overall transfer level: Needs assistance Equipment used: None Transfers: Sit to/from Stand Sit to Stand: Supervision         General transfer comment: Noted unsteadiness at initial stand; Able to stabilize self with UE support  Ambulation/Gait Ambulation/Gait assistance: Min guard;Min assist Ambulation Distance (Feet): 200 Feet Assistive device: 1 person hand held assist (and hallway rail) Gait Pattern/deviations: Step-through pattern     General Gait Details: Initially walked without UE support and pt lost her balance; noted she is tending to reach out for UE support; Walked in hallway with handheld assist and use of hallway rail prn  Stairs            Wheelchair Mobility    Modified Rankin (Stroke Patients Only)        Balance Overall balance assessment: Needs assistance   Sitting balance-Leahy Scale: Good       Standing balance-Leahy Scale: Fair                               Pertinent Vitals/Pain Pain Assessment: No/denies pain    Home Living Family/patient expects to be discharged to:: Private residence Living Arrangements: Other relatives (2 Granddaugheters) Available Help at Discharge: Family;Available PRN/intermittently Type of Home: House Home Access: Stairs to enter Entrance Stairs-Rails: Right Entrance Stairs-Number of Steps: 3 Home Layout: One level Home Equipment: None      Prior Function Level of Independence: Independent         Comments: doesn't drive     Hand Dominance        Extremity/Trunk Assessment   Upper Extremity Assessment: Defer to OT evaluation           Lower Extremity Assessment: Generalized weakness (Benefits form UE support)      Cervical / Trunk Assessment: Normal  Communication   Communication: Other (comment) (slow to respond to questions, and didn't talk much in sessio)  Cognition Arousal/Alertness: Awake/alert Behavior During Therapy: WFL for tasks assessed/performed Overall Cognitive Status: No family/caregiver present to determine baseline cognitive functioning                      General Comments General comments (skin integrity, edema, etc.): BP 156/98 HR 111 post amb  Exercises        Assessment/Plan    PT Assessment Patient needs continued PT services  PT Diagnosis Difficulty walking   PT Problem List Decreased activity tolerance;Decreased balance;Decreased mobility;Decreased knowledge of use of DME;Decreased knowledge of precautions  PT Treatment Interventions DME instruction;Gait training;Stair training;Functional mobility training;Therapeutic activities;Therapeutic exercise;Patient/family education   PT Goals (Current goals can be found in the Care Plan section) Acute Rehab PT  Goals Patient Stated Goal: just want to nap PT Goal Formulation: With patient Time For Goal Achievement: 06/20/15 Potential to Achieve Goals: Good    Frequency Min 3X/week   Barriers to discharge        Co-evaluation               End of Session Equipment Utilized During Treatment: Gait belt Activity Tolerance: Patient tolerated treatment well Patient left: in bed;with call bell/phone within reach Nurse Communication: Mobility status    Functional Assessment Tool Used: Clinical Judgement Functional Limitation: Mobility: Walking and moving around Mobility: Walking and Moving Around Current Status [id number]): At least 1 percent but less than 20 percent impaired, limited or restricted Mobility: Walking and Moving Around Goal Status [phone]): 0 percent impaired, limited or restricted    Time: 1041-1102 PT Time Calculation (min) (ACUTE ONLY): 21 min   Charges:   PT Evaluation $Initial PT Evaluation Tier I: 1 Procedure     PT G Codes:   PT G-Codes **NOT FOR INPATIENT CLASS** Functional Assessment Tool Used: Clinical Judgement Functional Limitation: Mobility: Walking and moving around Mobility: Walking and Moving Around Current Status [id number]): At least 1 percent but less than 20 percent impaired, limited or restricted Mobility: Walking and Moving Around Goal Status [phone]): 0 percent impaired, limited or restricted    Roney Marion [facility] 06/06/2015, 11:27 AM  Roney Marion, [place] Pager [phone] Office [phone]

## 2015-06-06 NOTE — Procedures (Signed)
ELECTROENCEPHALOGRAM REPORT  Date of Study: 06/06/2015  Patient's Name: Tricia Huynh MRN: [id number] Date of Birth: Dec 04, 1955  Referring Provider: Dr. Reyne Dumas  Clinical History: This is a 59 year old woman found unresponsive with bowel/bladder incontinence.   Medications: gabapentin (NEURONTIN) tablet 600 mg amitriptyline (ELAVIL) tablet 50 mg  amLODipine (NORVASC) tablet 5 mg aspirin tablet 325 mg atorvastatin (LIPITOR) tablet 40 mg famotidine (PEPCID) tablet 20 mg hydrochlorothiazide (HYDRODIURIL) tablet 25 mg irbesartan (AVAPRO) tablet 300 mg metoprolol succinate (TOPROL-XL) 24 hr tablet 50 mg potassium chloride SA (K-DUR,KLOR-CON) CR tablet 40 mEq sucralfate (CARAFATE) tablet 1 g  Technical Summary: A multichannel digital EEG recording measured by the international 10-20 system with electrodes applied with paste and impedances below 5000 ohms performed in our laboratory with EKG monitoring in an awake and asleep patient.  Hyperventilation and photic stimulation were performed.  The digital EEG was referentially recorded, reformatted, and digitally filtered in a variety of bipolar and referential montages for optimal display.    Description: The patient is awake and asleep during the recording.  During maximal wakefulness, there is a symmetric, medium voltage 9 Hz posterior dominant rhythm that attenuates with eye opening. There is a moderate amount of focal 4-5 Hz theta slowing seen over the bilateral occipital regions, left greater than right, at times sharply contoured. These can occur in semi-rhythmic bursts and runs lasting 1- 4 seconds. During drowsiness, there is an increase in theta and delta slowing of the background, with similar focal slowing seen over the occipital regions. Vertex waves and symmetric sleep spindles were seen. Hyperventilation did not elicit any change from baseline, although patient noted to be very drowsy during activation procedures. Photic  stimulation produced a posterior-dominant photoepileptiform response with asymmetry noted, seen more over the left occipital region with sharp waves and embedded spikes and aftergoing slow waves seen with each flash-frequency, notably at 3Hz , 5Hz , and 8Hz  frequencies. Patient did not report any symptoms during these. There were no electrographic seizures seen.   EKG lead was unremarkable.  Impression: This awake and asleep EEG is abnormal due to the presence of: 1. Focal slowing over the occipital regions, left greater than right 2. Posterior-dominant photoepileptiform response, asymmetric more on the left occipital region  Clinical Correlation of the above findings indicates focal cerebral dysfunction over the occipital regions, left greater than right, suggestive of underlying structural or physiologic abnormality. There is possible tendency for seizures to arise from the left occipital region.    Ellouise Newer, M.D.

## 2015-06-06 NOTE — Progress Notes (Signed)
Pt. Nonverbal on admit and currently BP is 189/110. Joaquin Bend E, RN 06/06/2015 6:11 AM

## 2015-06-06 NOTE — Progress Notes (Signed)
EEG completed; results pending.    

## 2015-06-06 NOTE — H&P (Signed)
[facility] History and Physical  Patient: Tricia Huynh  MRN: [id number]  DOB: Mar 04, 1956  DOS: the patient was seen and examined on 06/06/2015 PCP: No PCP Per Patient  Referring physician: Dr. Sharol Given Chief Complaint: Altered mental status  HPI: Tricia Huynh is a 59 y.o. female with Past medical history of diabetes mellitus, neuropathy, hypertension, dyslipidemia. The patient is presenting with complaints of altered mental status. The patient actually had a passing out episode while she was in the bathroom. The episode was not witnessed. Patient's daughter heard a noise and found the patient was not responsive in the bathroom and while the patient was here in the ER she remained unresponsive and lethargic. Patient was initially nonverbal but at the time of my evaluation was started to talk again. Patient denies any complaints of fever, chills, chest pain, abdominal pain, nausea, vomiting, diarrhea, burning urination, any recent change in medication. She denies taking any extra medication or missing any doses of medication. She was recently started on amlodipine and on [place] other than that denies any changes in her medication. Patient's father has recently passed and she has been having significant difficulty sleeping since last 2 months.  The patient is coming from home.  At her baseline ambulates without any support And is independent for most of her ADL manages her medication on her own.  Review of Systems: as mentioned in the history of present illness.  A comprehensive review of the other systems is negative.  Past Medical History  Diagnosis Date  . Diabetes mellitus without complication   . Hypertension    History reviewed. No pertinent past surgical history. Social History:  reports that she has quit smoking. She has never used smokeless tobacco. She reports that she does not drink alcohol or use illicit drugs.  Allergies  Allergen Reactions  . Percocet  [Oxycodone-Acetaminophen] Nausea And Vomiting and Other (See Comments)    Shaky/unsteady.  . Vicodin [Hydrocodone-Acetaminophen] Nausea And Vomiting and Other (See Comments)    Shaky/unsteady.    History reviewed. No pertinent family history.  Prior to Admission medications   Medication Sig Start Date End Date Taking? Authorizing Provider  amitriptyline (ELAVIL) 50 MG tablet Take 50 mg by mouth at bedtime. 05/08/15  Yes Historical Provider, MD  amLODipine-olmesartan (AZOR) 5-40 MG per tablet Take 1 tablet by mouth daily.   Yes Historical Provider, MD  aspirin-acetaminophen-caffeine (EXCEDRIN MIGRAINE) [phone] MG per tablet Take 2 tablets by mouth every 6 (six) hours as needed for headache.   Yes Historical Provider, MD  atorvastatin (LIPITOR) 40 MG tablet Take 40 mg by mouth daily. 05/10/15  Yes Historical Provider, MD  gabapentin (NEURONTIN) 600 MG tablet Take 600 mg by mouth 2 (two) times daily.  10/08/14  Yes Historical Provider, MD  hydrochlorothiazide (HYDRODIURIL) 25 MG tablet Take 1 tablet (25 mg total) by mouth daily. 05/22/15  Yes Hannah Muthersbaugh, PA-C  LANTUS SOLOSTAR 100 UNIT/ML Solostar Pen Inject 20-50 Units into the skin at bedtime.  10/08/14  Yes Historical Provider, MD  metFORMIN (GLUCOPHAGE) 500 MG tablet Take 500 mg by mouth 2 (two) times daily. 10/09/14  Yes Historical Provider, MD  metoprolol succinate (TOPROL-XL) 25 MG 24 hr tablet Take 50 mg by mouth at bedtime. 05/08/15  Yes Historical Provider, MD  NOVOLOG FLEXPEN 100 UNIT/ML FlexPen Inject 20-50 Units into the skin 2 (two) times daily. 50 units in am and 20 units in pm 10/08/14  Yes Historical Provider, MD  ranitidine (ZANTAC) 150 MG tablet Take 1 tablet (150  mg total) by mouth 2 (two) times daily. 10/18/14  Yes Blanchie Dessert, MD  sucralfate (CARAFATE) 1 G tablet Take 1 tablet (1 g total) by mouth 4 (four) times daily -  with meals and at bedtime. 10/18/14  Yes Blanchie Dessert, MD  Vitamin D, Ergocalciferol,  (DRISDOL) 50000 UNITS CAPS capsule Take 50,000 Units by mouth every 7 (seven) days. On Mondays   Yes Historical Provider, MD    Physical Exam: Filed Vitals:   06/06/15 0515 06/06/15 0530 06/06/15 0606 06/06/15 0608  BP: 133/88 128/88 189/110 189/110  Pulse: 81 76 80 78  Temp:    97.8 F (36.6 C)  TempSrc:   Oral Oral  Resp: 12 11 16 16   Height:   5\' 7"  (1.702 m)   Weight:   61.054 kg (134 lb 9.6 oz)   SpO2: 100% 99% 99% 100%    General: Alert, Awake and Oriented to Time, Place and Person. Appear in mild distress Eyes: PERRL ENT: Oral Mucosa clear moist. Neck: no JVD Cardiovascular: S1 and S2 Present, no Murmur, Peripheral Pulses Present Respiratory: Bilateral Air entry equal and Decreased,  Clear to Auscultation, no Crackles, no wheezes Abdomen: Bowel Sound present, Soft and non tender Skin: no Rash Extremities: no Pedal edema, no calf tenderness Neurologic: Mental status AAOx3, speech normal, attention normal,  Cranial Nerves PERRL, EOM normal and present, facial sensation to light touch present,  Motor strength bilateral equal strength 5/5,  Sensation present to light touch,  reflexes present knee and biceps, babinski negative,  Proprioception normal, Cerebellar test normal finger nose finger.  Labs on Admission:  CBC:  Recent Labs Lab 06/02/15 0147 06/06/15 0250 06/06/15 0253  WBC 5.0 4.9  --   NEUTROABS  --  2.4  --   HGB 11.5* 11.5* 12.2  HCT 33.2* 33.3* 36.0  MCV 84.1 84.1  --   PLT 253 238  --     CMP     Component Value Date/Time   NA 140 06/06/2015 0253   K 3.0* 06/06/2015 0253   CL 102 06/06/2015 0253   CO2 24 06/06/2015 0250   GLUCOSE 268* 06/06/2015 0253   BUN 20 06/06/2015 0253   CREATININE 1.10* 06/06/2015 0253   CALCIUM 9.2 06/06/2015 0250   PROT 7.7 06/06/2015 0250   ALBUMIN 3.9 06/06/2015 0250   AST 16 06/06/2015 0250   ALT 11* 06/06/2015 0250   ALKPHOS 150* 06/06/2015 0250   BILITOT 0.6 06/06/2015 0250   GFRNONAA 49* 06/06/2015 0250     GFRAA 57* 06/06/2015 0250    No results for input(s): LIPASE, AMYLASE in the last 168 hours.  No results for input(s): CKTOTAL, CKMB, CKMBINDEX, TROPONINI in the last 168 hours. BNP (last 3 results) No results for input(s): BNP in the last 8760 hours.  ProBNP (last 3 results) No results for input(s): PROBNP in the last 8760 hours.   Radiological Exams on Admission: Ct Head Wo Contrast  06/06/2015   CLINICAL DATA:  Acute onset of loss of consciousness. Found unresponsive. Lethargy and confusion. Left arm weakness and incontinence. Initial encounter.  EXAM: CT HEAD WITHOUT CONTRAST  TECHNIQUE: Contiguous axial images were obtained from the base of the skull through the vertex without intravenous contrast.  COMPARISON:  CT of the head performed 05/26/2015  FINDINGS: There is no evidence of acute infarction, mass lesion, or intra- or extra-axial hemorrhage on CT.  Prominence of the sulci suggests mild cortical volume loss. Mild cerebellar atrophy is noted.  The brainstem and fourth ventricle  are within normal limits. The basal ganglia are unremarkable in appearance. The cerebral hemispheres demonstrate grossly normal gray-white differentiation. No mass effect or midline shift is seen.  There is no evidence of fracture; there is a 2.6 cm focus of mixed fat and fluid attenuation within the soft tissues at the left occiput, with mild associated calvarial thinning. The visualized portions of the orbits are within normal limits. The paranasal sinuses and mastoid air cells are well-aerated. No significant soft tissue abnormalities are seen.  IMPRESSION: 1. No acute intracranial pathology seen on CT. 2. Mild cortical volume loss noted. 3. 2.6 cm focus of mixed fat and fluid attenuation within the soft tissues at the left occiput, with mild associated calvarial thinning. Given its appearance, this is likely benign, though would correlate clinically for associated symptoms. These results were called by  telephone at the time of interpretation on 06/06/2015 at 2:59 am to Dr. Janann Colonel, who verbally acknowledged these results.   Electronically Signed   By: Garald Balding M.D.   On: 06/06/2015 03:00   Mr Brain Wo Contrast  06/06/2015   CLINICAL DATA:  Initial evaluation for acute altered mental status.  EXAM: MRI HEAD WITHOUT CONTRAST  TECHNIQUE: Multiplanar, multiecho pulse sequences of the brain and surrounding structures were obtained without intravenous contrast.  COMPARISON:  Prior CT from earlier the same day.  FINDINGS: Mild diffuse prominence of the CSF containing spaces is compatible with generalized age-related cerebral atrophy. A few scattered T2/FLAIR hyperintense foci noted within the deep white matter of both cerebral hemispheres, nonspecific, and felt to be within normal limits for patient age.  No abnormal foci of restricted diffusion to suggest acute intracranial infarct identified. Gray-white matter differentiation maintained. Normal intravascular flow voids are preserved. No acute or chronic intracranial hemorrhage.  No mass lesion, midline shift, or mass effect. No hydrocephalus. No extra-axial fluid collection.  Craniocervical junction within normal limits. Mild degenerative changes noted within the visualized upper cervical spine. Pituitary gland normal.  No acute abnormality about the orbits. Sequelae of prior bilateral lens extraction noted.  Mild mucosal thickening within the ethmoidal air cells. Visualized paranasal sinuses are otherwise clear. No mastoid effusion. Inner ear structures normal.  Bone marrow signal intensity within normal limits.  Well-circumscribed 2.5 cm ovoid lesion present within the subcutaneous tissues of the left occipital scalp (series 6, image 8). This lesion is indeterminate, but most likely benign. Scalp soft tissues are otherwise unremarkable.  IMPRESSION: 1. No acute intracranial infarct or other abnormality identified. 2. Mild age-related cerebral atrophy. 3. 2.5  cm well-circumscribed ovoid lesion within the left occipital scalp. This lesion is indeterminate, but most certainly benign. Correlation with physical exam and symptomatology recommended.   Electronically Signed   By: Jeannine Boga M.D.   On: 06/06/2015 04:00   EKG: Independently reviewed. normal sinus rhythm, nonspecific ST and T waves changes.  Assessment/Plan Principal Problem:   Acute encephalopathy Active Problems:   Mood disorder   Diabetes mellitus type 2 in nonobese   Diabetic neuropathy   Dyslipidemia   Essential hypertension   1. Acute encephalopathy The patient is presenting with complaints of passing out episode followed by a prolonged period of lethargy as well as nonresponsiveness. She had a CT scan as well as MRI of the brain which did not show any acute abnormality. Neurology has admitted the patient and recommend further workup to rule out any acute encephalopathy. We check further blood work to identify metabolic causes. Possibility of sleep evaluation with disorder cannot be  ruled out. Possibility of stress as well cannot be ruled out.  2. essential hypertension. We will continue home medication. We'll use when necessary hydralazine.  3. mood disorder. We will continue amitriptyline.  4.Diabetes mellitus type 2. Checking hemoglobin A1c. Placing her on sliding scale. Continuing home Lantus  5.Diabetic neuropathy. Continue gabapentin.   Advance goals of care discussion:full code Consults: ED discussed with Dr. Janann Colonel  from neurology.  DVT Prophylaxis: subcutaneous Heparin Nutrition: Nothing by mouth pending stroke evaluation   Family Communication: family was present at bedside, opportunity was given to ask question and all questions were answered satisfactorily at the time of interview. Disposition: Admitted as observationto telemetry unit.  Author: Berle Mull, MD [facility] Pager: [phone] 06/06/2015  If 7PM-7AM, please  contact night-coverage [email or web address] Password [email or web address]

## 2015-06-06 NOTE — Code Documentation (Signed)
Tricia Huynh presents to the [facility] via [facility] after being found unresponsive in the bathroom by her family.  Per family she was LKW at midnight when she went to bed.  She has a hx of poorly controlled HTN and noncompliance with her meds though she has recently resumed her BP meds.  On presentation she is drowsy but arouses to voice.  She does follow some simple commands with significant prompting.  She is weak bilaterally.  She responded verbally "no" to one question and has nodded her head appropriately.  Her pupils are 2 bil. NIH .  She was incontinent of stool. Plan evaluate by medicine.

## 2015-06-06 NOTE — Progress Notes (Signed)
Subjective: Patient improved.  Now talking and holding conversation.  Has no complaints other than feeling lethargic.   Exam: Filed Vitals:   06/06/15 0906  BP: 175/97  Pulse: 85  Temp: 98 F (36.7 C)  Resp:        Gen: In bed, NAD MS: AOX4, speech showing no dysarthria or aphasia.  CN: PERRLA, EOMI, TML face symmetric Motor: moving all extremities antigravity and equally Sensory: grossly intact   Pertinent Labs: K+ 2.9 TSH 0.212 Glucose 268    Impression: 59 y.o. female with hx of DM and HTN presenting to the ED after being found down unresponsive with bowel/bladder incontinence. Unclear etiology of presentation. DWI imaging negative for acute stroke. Cognition improved this AM. TSH 0.212 and K+ 2.9.  Etiology remains unclear however Seizure still remains in the differential.    Recommendations: 1) EEG 2) Carotid US 3) treat TSH and K+  Will follow  Etta Quill PA-C [facility] [phone]  06/06/2015, 10:24 AM

## 2015-06-06 NOTE — Progress Notes (Signed)
Patient seen and examined  Awake and alert, not somnolent,  EEG, carotid Doppler pending  Hypokalemia likely secondary to hydrochlorothiazide, replete and recheck  TSH is suppressed, check free T4  PT recommends a cane, no home health  Anticipate discharge home tomorrow after workup is completed

## 2015-06-06 NOTE — Progress Notes (Signed)
In and out cath complete - 450 cc yellow, clear urine noted.

## 2015-06-06 NOTE — ED Notes (Signed)
Pt arrives from home via EMS c/o ALOC.Marland Kitchen Per EMS pt had a period of unresponsiveness and syncopal episode. CBG 257 140/80

## 2015-06-06 NOTE — Progress Notes (Deleted)
Pt arrived to unit alert and oriented x4. Oriented to room, unit, and staff.  Bed in lowest position and call bell is within reach. Will continue to monitor. 

## 2015-06-07 ENCOUNTER — Ambulatory Visit (HOSPITAL_COMMUNITY): Payer: Medicare Other

## 2015-06-07 DIAGNOSIS — R55 Syncope and collapse: Secondary | ICD-10-CM | POA: Diagnosis not present

## 2015-06-07 DIAGNOSIS — G9340 Encephalopathy, unspecified: Secondary | ICD-10-CM | POA: Diagnosis not present

## 2015-06-07 LAB — COMPREHENSIVE METABOLIC PANEL
ALT: 10 U/L — ABNORMAL LOW (ref 14–54)
AST: 13 U/L — ABNORMAL LOW (ref 15–41)
Albumin: 3.5 g/dL (ref 3.5–5.0)
Alkaline Phosphatase: 131 U/L — ABNORMAL HIGH (ref 38–126)
Anion gap: 10 (ref 5–15)
BUN: 28 mg/dL — ABNORMAL HIGH (ref 6–20)
CO2: 25 mmol/L (ref 22–32)
Calcium: 9.8 mg/dL (ref 8.9–10.3)
Chloride: 106 mmol/L (ref 101–111)
Creatinine, Ser: 1.32 mg/dL — ABNORMAL HIGH (ref 0.44–1.00)
GFR calc Af Amer: 50 mL/min — ABNORMAL LOW (ref >60)
GFR calc non Af Amer: 43 mL/min — ABNORMAL LOW (ref >60)
Glucose, Bld: 137 mg/dL — ABNORMAL HIGH (ref 65–99)
Potassium: 4 mmol/L (ref 3.5–5.1)
Sodium: 141 mmol/L (ref 135–145)
Total Bilirubin: 0.4 mg/dL (ref 0.3–1.2)
Total Protein: 7.4 g/dL (ref 6.5–8.1)

## 2015-06-07 LAB — GLUCOSE, CAPILLARY
Glucose-Capillary: 149 mg/dL — ABNORMAL HIGH (ref 65–99)
Glucose-Capillary: 198 mg/dL — ABNORMAL HIGH (ref 65–99)

## 2015-06-07 LAB — LIPID PANEL
Cholesterol: 130 mg/dL (ref 0–200)
HDL: 41 mg/dL (ref >40)
LDL Cholesterol: 76 mg/dL (ref 0–99)
Total CHOL/HDL Ratio: 3.2 RATIO
Triglycerides: 63 mg/dL (ref <150)
VLDL: 13 mg/dL (ref 0–40)

## 2015-06-07 MED ORDER — LEVETIRACETAM 500 MG PO TABS
500.0000 mg | ORAL_TABLET | Freq: Two times a day (BID) | ORAL | Status: DC
  Administered 2015-06-07: 500 mg via ORAL
  Filled 2015-06-07: qty 1

## 2015-06-07 MED ORDER — GLIPIZIDE 5 MG PO TABS: 2.5000 mg | ORAL_TABLET | Freq: Every day | ORAL | Status: DC

## 2015-06-07 MED ORDER — TAMSULOSIN HCL 0.4 MG PO CAPS
0.4000 mg | ORAL_CAPSULE | Freq: Every day | ORAL | Status: DC
  Administered 2015-06-07: 0.4 mg via ORAL
  Filled 2015-06-07: qty 1

## 2015-06-07 MED ORDER — LEVETIRACETAM 500 MG PO TABS: 500.0000 mg | ORAL_TABLET | Freq: Two times a day (BID) | ORAL | Status: DC

## 2015-06-07 NOTE — Progress Notes (Signed)
Occupational Therapy Evaluation Patient Details Name: Tricia Huynh MRN: [id number] DOB: 1955/11/24 Today's Date: 06/07/2015    History of Present Illness 59 yo female adm to [facility] after being found unresponsive. PMH + for DM, HTN. Pt underwent a CXR 7/16 secondary to chest pain - which was negative. MRI negative. Pt admits to mild left sided weakness.   Clinical Impression   Patient admitted with above. Patient independent PTA. Patient currently functioning at an overall supervision -> min assist level. D/C from acute OT services and no additional follow-up OT needs at this time. All appropriate education provided to patient. Please re-order OT if needed.    Patient with decreased dynamic standing balance. PT recommended OPPT, but according to case managers note pt refused. Discussed with patient why PT recommend OPPT and patient stated she was willing to go due to decreased balance; notified case manager of this. Also discussed use of BSC over toilet seat and in shower, notified case manager of this as well.     Follow Up Recommendations  No OT follow up;Supervision/Assistance - 24 hour    Equipment Recommendations  3 in 1 bedside comode    Recommendations for Other Services  None at this time   Precautions / Restrictions Precautions Precautions: Fall Precaution Comments: Fall risk significantly decreased with use of hallway rails, or assistive device for UE support Restrictions Weight Bearing Restrictions: No      Mobility Bed Mobility Overal bed mobility: Independent  Transfers Overall transfer level: Needs assistance   Transfers: Sit to/from Stand Sit to Stand: Min guard General transfer comment: During inital sit>stand from bed, pt lost balance and fell back onto bed. Pt stood second time and unsteady on feet.     Balance Overall balance assessment: Needs assistance Sitting-balance support: No upper extremity supported;Feet supported Sitting balance-Leahy Scale:  Good     Standing balance support: No upper extremity supported;During functional activity Standing balance-Leahy Scale: Poor    ADL Overall ADL's : Needs assistance/impaired General ADL Comments: Pt overall supervision with ADLs secondary to decreased dynamic standing balance. Pt requires up to min guard assist for functional transfers and functional mobility throughout room, pt limited by decreased dynamic standing balance/tolerance/endurance.      Vision Additional Comments: "It's not as blurry as when I came into the hospital", no change from baseline          Pertinent Vitals/Pain Pain Assessment: No/denies pain     Hand Dominance Right   Extremity/Trunk Assessment Upper Extremity Assessment Upper Extremity Assessment: Overall WFL for tasks assessed   Lower Extremity Assessment Lower Extremity Assessment: Defer to PT evaluation   Cervical / Trunk Assessment Cervical / Trunk Assessment: Normal   Communication Communication Communication: No difficulties   Cognition Arousal/Alertness: Awake/alert Behavior During Therapy: WFL for tasks assessed/performed Overall Cognitive Status: Within Functional Limits for tasks assessed              Home Living Family/patient expects to be discharged to:: Private residence Living Arrangements: Other relatives (2 grandchildren and daughter) Available Help at Discharge: Family;Available PRN/intermittently Type of Home: House Home Access: Stairs to enter [employer] of Steps: 3 Entrance Stairs-Rails: Right Home Layout: One level     Bathroom Shower/Tub: Corporate investment banker: Standard     Home Equipment: None   Prior Functioning/Environment Level of Independence: Independent        Comments: doesn't drive    OT Diagnosis: Generalized weakness   OT Problem List:  n/a, no acute OT  needs identified    OT Treatment/Interventions:   n/a, no acute OT needs identified    OT  Goals(Current goals can be found in the care plan section) Acute Rehab OT Goals Patient Stated Goal: go home today! OT Goal Formulation: All assessment and education complete, DC therapy  OT Frequency:   n/a, no acute OT needs identified    Barriers to D/C:  None known at this time   End of Session Nurse Communication: Mobility status;Other (comment) (need for [facility])  Activity Tolerance: Patient tolerated treatment well Patient left: in bed;with call bell/phone within reach;with bed alarm set   Time: [id number] OT Time Calculation (min): 9 min Charges:  OT General Charges $OT Visit: 1 Procedure OT Evaluation $Initial OT Evaluation Tier I: 1 Procedure G-Codes: OT G-codes **NOT FOR INPATIENT CLASS** Functional Limitation: Self care Self Care Current Status [id number]): At least 1 percent but less than 20 percent impaired, limited or restricted Self Care Goal Status [id number]): At least 1 percent but less than 20 percent impaired, limited or restricted Self Care Discharge Status [phone]): At least 1 percent but less than 20 percent impaired, limited or restricted  Kerisha Goughnour 06/07/2015, 1:52 PM

## 2015-06-07 NOTE — Discharge Summary (Addendum)
Physician Discharge Summary  Tricia Huynh MRN: [id number] DOB/AGE: 59-04-1956 59 y.o.  PCP: No PCP Per Patient   Admit date: 06/06/2015 Discharge date: 06/07/2015  Discharge Diagnoses:     Principal Problem:   Acute encephalopathy Active Problems:   Mood disorder   Diabetes mellitus type 2 in nonobese   Diabetic neuropathy   Dyslipidemia   Essential hypertension  seizure disorder   Follow-up recommendations Follow-up with PCP in 3-5 days , including although additional recommended appointments as below Follow-up CBC, CMP in 3-5 days Follow-up of [facility] in one month Patient instructed not to drive or operate heavy machinery      Medication List    TAKE these medications        amitriptyline 50 MG tablet  Commonly known as:  ELAVIL  Take 50 mg by mouth at bedtime.     aspirin-acetaminophen-caffeine 010-071-21 MG per tablet  Commonly known as:  EXCEDRIN MIGRAINE  Take 2 tablets by mouth every 6 (six) hours as needed for headache.     atorvastatin 40 MG tablet  Commonly known as:  LIPITOR  Take 40 mg by mouth daily.     AZOR 5-40 MG per tablet  Generic drug:  amLODipine-olmesartan  Take 1 tablet by mouth daily.     gabapentin 600 MG tablet  Commonly known as:  NEURONTIN  Take 600 mg by mouth 2 (two) times daily.     glipiZIDE 5 MG tablet  Commonly known as:  GLUCOTROL  Take 0.5 tablets (2.5 mg total) by mouth daily before breakfast.     hydrochlorothiazide 25 MG tablet  Commonly known as:  HYDRODIURIL  Take 1 tablet (25 mg total) by mouth daily.     LANTUS SOLOSTAR 100 UNIT/ML Solostar Pen  Generic drug:  Insulin Glargine  Inject 20-50 Units into the skin at bedtime.     levETIRAcetam 500 MG tablet  Commonly known as:  KEPPRA  Take 1 tablet (500 mg total) by mouth 2 (two) times daily.     metFORMIN 500 MG tablet  Commonly known as:  GLUCOPHAGE  Take 500 mg by mouth 2 (two) times daily.     metoprolol succinate 25 MG 24  hr tablet  Commonly known as:  TOPROL-XL  Take 50 mg by mouth at bedtime.     NOVOLOG FLEXPEN 100 UNIT/ML FlexPen  Generic drug:  insulin aspart  Inject 20-50 Units into the skin 2 (two) times daily. 50 units in am and 20 units in pm     ranitidine 150 MG tablet  Commonly known as:  ZANTAC  Take 1 tablet (150 mg total) by mouth 2 (two) times daily.     sucralfate 1 G tablet  Commonly known as:  CARAFATE  Take 1 tablet (1 g total) by mouth 4 (four) times daily -  with meals and at bedtime.     Vitamin D (Ergocalciferol) 50000 UNITS Caps capsule  Commonly known as:  DRISDOL  Take 50,000 Units by mouth every 7 (seven) days. On Mondays         Discharge Condition: Stable    Disposition: 01-Home or Self Care   Consults: * Neurology     Significant Diagnostic Studies:  Dg Chest 2 View  06/02/2015   CLINICAL DATA:  59 year old female with chest pain  EXAM: CHEST  2 VIEW  COMPARISON:  None.  FINDINGS: The heart size and mediastinal contours are within normal limits. Both lungs are clear. The visualized skeletal structures are unremarkable.  IMPRESSION: No active cardiopulmonary disease.   Electronically Signed   By: Anner Crete M.D.   On: 06/02/2015 02:25   Ct Head Wo Contrast  06/06/2015   CLINICAL DATA:  Acute onset of loss of consciousness. Found unresponsive. Lethargy and confusion. Left arm weakness and incontinence. Initial encounter.  EXAM: CT HEAD WITHOUT CONTRAST  TECHNIQUE: Contiguous axial images were obtained from the base of the skull through the vertex without intravenous contrast.  COMPARISON:  CT of the head performed 05/26/2015  FINDINGS: There is no evidence of acute infarction, mass lesion, or intra- or extra-axial hemorrhage on CT.  Prominence of the sulci suggests mild cortical volume loss. Mild cerebellar atrophy is noted.  The brainstem and fourth ventricle are within normal limits. The basal ganglia are unremarkable in appearance. The cerebral  hemispheres demonstrate grossly normal gray-white differentiation. No mass effect or midline shift is seen.  There is no evidence of fracture; there is a 2.6 cm focus of mixed fat and fluid attenuation within the soft tissues at the left occiput, with mild associated calvarial thinning. The visualized portions of the orbits are within normal limits. The paranasal sinuses and mastoid air cells are well-aerated. No significant soft tissue abnormalities are seen.  IMPRESSION: 1. No acute intracranial pathology seen on CT. 2. Mild cortical volume loss noted. 3. 2.6 cm focus of mixed fat and fluid attenuation within the soft tissues at the left occiput, with mild associated calvarial thinning. Given its appearance, this is likely benign, though would correlate clinically for associated symptoms. These results were called by telephone at the time of interpretation on 06/06/2015 at 2:59 am to Dr. Janann Colonel, who verbally acknowledged these results.   Electronically Signed   By: Garald Balding M.D.   On: 06/06/2015 03:00   Ct Head Wo Contrast  05/26/2015   CLINICAL DATA:  Headache and high blood pressure today.  EXAM: CT HEAD WITHOUT CONTRAST  TECHNIQUE: Contiguous axial images were obtained from the base of the skull through the vertex without intravenous contrast.  COMPARISON:  05/22/2015.  FINDINGS: The ventricles are normal in size and configuration. No extra-axial fluid collections are identified. The gray-white differentiation is normal. No CT findings for acute intracranial process such as hemorrhage or infarction. No mass lesions. The brainstem and cerebellum are grossly normal.  The bony structures are intact. The paranasal sinuses and mastoid air cells are clear except for scattered ethmoid sinus disease. The globes are intact. Stable fatty scalp lesion in the left occipital area.  IMPRESSION: Normal head CT.  Stable ethmoid sinus disease.   Electronically Signed   By: Marijo Sanes M.D.   On: 05/26/2015 17:43   Ct  Head Wo Contrast  05/22/2015   CLINICAL DATA:  59 year old female with high blood pressure and frontal headache.  EXAM: CT HEAD WITHOUT CONTRAST  TECHNIQUE: Contiguous axial images were obtained from the base of the skull through the vertex without intravenous contrast.  COMPARISON:  None  FINDINGS: The ventricles and the sulci are appropriate in size for the patient's age. There is no intracranial hemorrhage. No midline shift or mass effect identified. The gray-white matter differentiation is preserved.  The visualized paranasal sinuses and mastoid air cells are well aerated. The calvarium is intact. A 1.0 x 2.3 cm ovoid soft tissue lesion with interspersed fat is noted along the outer table of the left occipital calvarium. There is no surrounding reactive changes or erosion of the adjacent calvarium.  IMPRESSION: No acute intracranial pathology.   Electronically  Signed   By: Anner Crete M.D.   On: 05/22/2015 15:57   Mr Brain Wo Contrast  06/06/2015   CLINICAL DATA:  Initial evaluation for acute altered mental status.  EXAM: MRI HEAD WITHOUT CONTRAST  TECHNIQUE: Multiplanar, multiecho pulse sequences of the brain and surrounding structures were obtained without intravenous contrast.  COMPARISON:  Prior CT from earlier the same day.  FINDINGS: Mild diffuse prominence of the CSF containing spaces is compatible with generalized age-related cerebral atrophy. A few scattered T2/FLAIR hyperintense foci noted within the deep white matter of both cerebral hemispheres, nonspecific, and felt to be within normal limits for patient age.  No abnormal foci of restricted diffusion to suggest acute intracranial infarct identified. Gray-white matter differentiation maintained. Normal intravascular flow voids are preserved. No acute or chronic intracranial hemorrhage.  No mass lesion, midline shift, or mass effect. No hydrocephalus. No extra-axial fluid collection.  Craniocervical junction within normal limits. Mild  degenerative changes noted within the visualized upper cervical spine. Pituitary gland normal.  No acute abnormality about the orbits. Sequelae of prior bilateral lens extraction noted.  Mild mucosal thickening within the ethmoidal air cells. Visualized paranasal sinuses are otherwise clear. No mastoid effusion. Inner ear structures normal.  Bone marrow signal intensity within normal limits.  Well-circumscribed 2.5 cm ovoid lesion present within the subcutaneous tissues of the left occipital scalp (series 6, image 8). This lesion is indeterminate, but most likely benign. Scalp soft tissues are otherwise unremarkable.  IMPRESSION: 1. No acute intracranial infarct or other abnormality identified. 2. Mild age-related cerebral atrophy. 3. 2.5 cm well-circumscribed ovoid lesion within the left occipital scalp. This lesion is indeterminate, but most certainly benign. Correlation with physical exam and symptomatology recommended.   Electronically Signed   By: Jeannine Boga M.D.   On: 06/06/2015 04:00    EEG Impression: This awake and asleep EEG is abnormal due to the presence of: 1. Focal slowing over the occipital regions, left greater than right 2. Posterior-dominant photoepileptiform response, asymmetric more on the left occipital region  Clinical Correlation of the above findings indicates focal cerebral dysfunction over the occipital regions, left greater than right, suggestive of underlying structural or physiologic abnormality. There is possible tendency for seizures to arise from the left occipital region.  Filed Weights   06/06/15 0252 06/06/15 0606  Weight: 63.277 kg (139 lb 8 oz) 61.054 kg (134 lb 9.6 oz)     Microbiology: No results found for this or any previous visit (from the past 240 hour(s)).     Blood Culture No results found for: SDES, SPECREQUEST, CULT, REPTSTATUS    Labs: Results for orders placed or performed during the hospital encounter of 06/06/15 (from the past  48 hour(s))  Ethanol     Status: None   Collection Time: 06/06/15  2:50 AM  Result Value Ref Range   Alcohol, Ethyl (B) <5 <5 mg/dL    Comment:        LOWEST DETECTABLE LIMIT FOR SERUM ALCOHOL IS 5 mg/dL FOR MEDICAL PURPOSES ONLY   Protime-INR     Status: None   Collection Time: 06/06/15  2:50 AM  Result Value Ref Range   Prothrombin Time 13.4 11.6 - 15.2 seconds   INR 1.00 0.00 - 1.49  APTT     Status: None   Collection Time: 06/06/15  2:50 AM  Result Value Ref Range   aPTT 26 24 - 37 seconds  CBC     Status: Abnormal   Collection Time: 06/06/15  2:50 AM  Result Value Ref Range   WBC 4.9 4.0 - 10.5 K/uL   RBC 3.96 3.87 - 5.11 MIL/uL   Hemoglobin 11.5 (L) 12.0 - 15.0 g/dL   HCT 33.3 (L) 36.0 - 46.0 %   MCV 84.1 78.0 - 100.0 fL   MCH 29.0 26.0 - 34.0 pg   MCHC 34.5 30.0 - 36.0 g/dL   RDW 12.3 11.5 - 15.5 %   Platelets 238 150 - 400 K/uL  Differential     Status: None   Collection Time: 06/06/15  2:50 AM  Result Value Ref Range   Neutrophils Relative % 49 43 - 77 %   Neutro Abs 2.4 1.7 - 7.7 K/uL   Lymphocytes Relative 45 12 - 46 %   Lymphs Abs 2.2 0.7 - 4.0 K/uL   Monocytes Relative 6 3 - 12 %   Monocytes Absolute 0.3 0.1 - 1.0 K/uL   Eosinophils Relative 0 0 - 5 %   Eosinophils Absolute 0.0 0.0 - 0.7 K/uL   Basophils Relative 0 0 - 1 %   Basophils Absolute 0.0 0.0 - 0.1 K/uL  Comprehensive metabolic panel     Status: Abnormal   Collection Time: 06/06/15  2:50 AM  Result Value Ref Range   Sodium 137 135 - 145 mmol/L   Potassium 2.9 (L) 3.5 - 5.1 mmol/L   Chloride 101 101 - 111 mmol/L   CO2 24 22 - 32 mmol/L   Glucose, Bld 267 (H) 65 - 99 mg/dL   BUN 18 6 - 20 mg/dL   Creatinine, Ser 1.18 (H) 0.44 - 1.00 mg/dL   Calcium 9.2 8.9 - 10.3 mg/dL   Total Protein 7.7 6.5 - 8.1 g/dL   Albumin 3.9 3.5 - 5.0 g/dL   AST 16 15 - 41 U/L   ALT 11 (L) 14 - 54 U/L   Alkaline Phosphatase 150 (H) 38 - 126 U/L   Total Bilirubin 0.6 0.3 - 1.2 mg/dL   GFR calc non Af Amer 49 (L)  >60 mL/min   GFR calc Af Amer 57 (L) >60 mL/min    Comment: (NOTE) The eGFR has been calculated using the CKD EPI equation. This calculation has not been validated in all clinical situations. eGFR's persistently <60 mL/min signify possible Chronic Kidney Disease.    Anion gap 12 5 - 15  I-stat troponin, ED (not at [facility], [facility])     Status: None   Collection Time: 06/06/15  2:51 AM  Result Value Ref Range   Troponin i, poc 0.00 0.00 - 0.08 ng/mL   Comment 3            Comment: Due to the release kinetics of cTnI, a negative result within the first hours of the onset of symptoms does not rule out myocardial infarction with certainty. If myocardial infarction is still suspected, repeat the test at appropriate intervals.   I-Stat Chem 8, ED  (not at [facility], [facility])     Status: Abnormal   Collection Time: 06/06/15  2:53 AM  Result Value Ref Range   Sodium 140 135 - 145 mmol/L   Potassium 3.0 (L) 3.5 - 5.1 mmol/L   Chloride 102 101 - 111 mmol/L   BUN 20 6 - 20 mg/dL   Creatinine, Ser 1.10 (H) 0.44 - 1.00 mg/dL   Glucose, Bld 268 (H) 65 - 99 mg/dL   Calcium, Ion 1.12 1.12 - 1.23 mmol/L   TCO2 23 0 - 100 mmol/L   Hemoglobin 12.2 12.0 -  15.0 g/dL   HCT 36.0 36.0 - 46.0 %  I-Stat CG4 Lactic Acid, ED     Status: None   Collection Time: 06/06/15  2:56 AM  Result Value Ref Range   Lactic Acid, Venous 1.73 0.5 - 2.0 mmol/L  Urine rapid drug screen (hosp performed)not at [facility]     Status: None   Collection Time: 06/06/15  4:30 AM  Result Value Ref Range   Opiates NONE DETECTED NONE DETECTED   Cocaine NONE DETECTED NONE DETECTED   Benzodiazepines NONE DETECTED NONE DETECTED   Amphetamines NONE DETECTED NONE DETECTED   Tetrahydrocannabinol NONE DETECTED NONE DETECTED   Barbiturates NONE DETECTED NONE DETECTED    Comment:        DRUG SCREEN FOR MEDICAL PURPOSES ONLY.  IF CONFIRMATION IS NEEDED FOR ANY PURPOSE, NOTIFY LAB WITHIN 5 DAYS.        LOWEST DETECTABLE LIMITS FOR URINE DRUG  SCREEN Drug Class       Cutoff (ng/mL) Amphetamine      1000 Barbiturate      200 Benzodiazepine   683 Tricyclics       729 Opiates          300 Cocaine          300 THC              50   Urinalysis, Routine w reflex microscopic (not at [facility])     Status: Abnormal   Collection Time: 06/06/15  4:30 AM  Result Value Ref Range   Color, Urine YELLOW YELLOW   APPearance CLEAR CLEAR   Specific Gravity, Urine 1.019 1.005 - 1.030   pH 5.0 5.0 - 8.0   Glucose, UA 500 (A) NEGATIVE mg/dL   Hgb urine dipstick NEGATIVE NEGATIVE   Bilirubin Urine NEGATIVE NEGATIVE   Ketones, ur NEGATIVE NEGATIVE mg/dL   Protein, ur NEGATIVE NEGATIVE mg/dL   Urobilinogen, UA 0.2 0.0 - 1.0 mg/dL   Nitrite NEGATIVE NEGATIVE   Leukocytes, UA NEGATIVE NEGATIVE    Comment: MICROSCOPIC NOT DONE ON URINES WITH NEGATIVE PROTEIN, BLOOD, LEUKOCYTES, NITRITE, OR GLUCOSE <1000 mg/dL.  TSH     Status: Abnormal   Collection Time: 06/06/15  8:10 AM  Result Value Ref Range   TSH 0.212 (L) 0.350 - 4.500 uIU/mL  Ammonia     Status: None   Collection Time: 06/06/15  8:10 AM  Result Value Ref Range   Ammonia 14 9 - 35 umol/L  Vitamin B12     Status: None   Collection Time: 06/06/15  8:10 AM  Result Value Ref Range   Vitamin B-12 856 180 - 914 pg/mL    Comment: (NOTE) This assay is not validated for testing neonatal or myeloproliferative syndrome specimens for Vitamin B12 levels.   Folate     Status: None   Collection Time: 06/06/15  8:20 AM  Result Value Ref Range   Folate 16.6 >5.9 ng/mL  Glucose, capillary     Status: Abnormal   Collection Time: 06/06/15  8:58 AM  Result Value Ref Range   Glucose-Capillary 313 (H) 65 - 99 mg/dL   Comment 1 Notify RN    Comment 2 Document in Chart   Glucose, capillary     Status: Abnormal   Collection Time: 06/06/15 12:15 PM  Result Value Ref Range   Glucose-Capillary 314 (H) 65 - 99 mg/dL  T4, free     Status: None   Collection Time: 06/06/15 12:37 PM  Result Value Ref Range  Free T4 0.97 0.61 - 1.12 ng/dL  Glucose, capillary     Status: Abnormal   Collection Time: 06/06/15  6:15 PM  Result Value Ref Range   Glucose-Capillary 137 (H) 65 - 99 mg/dL  Glucose, capillary     Status: Abnormal   Collection Time: 06/06/15  9:56 PM  Result Value Ref Range   Glucose-Capillary 336 (H) 65 - 99 mg/dL   Comment 1 Notify RN    Comment 2 Document in Chart   Comprehensive metabolic panel     Status: Abnormal   Collection Time: 06/07/15  3:50 AM  Result Value Ref Range   Sodium 141 135 - 145 mmol/L   Potassium 4.0 3.5 - 5.1 mmol/L    Comment: DELTA CHECK NOTED   Chloride 106 101 - 111 mmol/L   CO2 25 22 - 32 mmol/L   Glucose, Bld 137 (H) 65 - 99 mg/dL   BUN 28 (H) 6 - 20 mg/dL   Creatinine, Ser 1.32 (H) 0.44 - 1.00 mg/dL   Calcium 9.8 8.9 - 10.3 mg/dL   Total Protein 7.4 6.5 - 8.1 g/dL   Albumin 3.5 3.5 - 5.0 g/dL   AST 13 (L) 15 - 41 U/L   ALT 10 (L) 14 - 54 U/L   Alkaline Phosphatase 131 (H) 38 - 126 U/L   Total Bilirubin 0.4 0.3 - 1.2 mg/dL   GFR calc non Af Amer 43 (L) >60 mL/min   GFR calc Af Amer 50 (L) >60 mL/min    Comment: (NOTE) The eGFR has been calculated using the CKD EPI equation. This calculation has not been validated in all clinical situations. eGFR's persistently <60 mL/min signify possible Chronic Kidney Disease.    Anion gap 10 5 - 15  Lipid panel     Status: None   Collection Time: 06/07/15  3:50 AM  Result Value Ref Range   Cholesterol 130 0 - 200 mg/dL   Triglycerides 63 <150 mg/dL   HDL 41 >40 mg/dL   Total CHOL/HDL Ratio 3.2 RATIO   VLDL 13 0 - 40 mg/dL   LDL Cholesterol 76 0 - 99 mg/dL    Comment:        Total Cholesterol/HDL:CHD Risk Coronary Heart Disease Risk Table                     Men   Women  1/2 Average Risk   3.4   3.3  Average Risk       5.0   4.4  2 X Average Risk   9.6   7.1  3 X Average Risk  23.4   11.0        Use the calculated Patient Ratio above and the CHD Risk Table to determine the patient's CHD  Risk.        ATP III CLASSIFICATION (LDL):  <100     mg/dL   Optimal  100-129  mg/dL   Near or Above                    Optimal  130-159  mg/dL   Borderline  160-189  mg/dL   High  >190     mg/dL   Very High   Glucose, capillary     Status: Abnormal   Collection Time: 06/07/15  7:59 AM  Result Value Ref Range   Glucose-Capillary 149 (H) 65 - 99 mg/dL   Comment 1 Notify RN      Lipid Panel  Component Value Date/Time   CHOL 130 06/07/2015 0350   TRIG 63 06/07/2015 0350   HDL 41 06/07/2015 0350   CHOLHDL 3.2 06/07/2015 0350   VLDL 13 06/07/2015 0350   LDLCALC 76 06/07/2015 0350     No results found for: HGBA1C   Lab Results  Component Value Date   LDLCALC 76 06/07/2015   CREATININE 1.32* 06/07/2015     HPI : Tricia Huynh is an 59 y.o. female hx of HTN, DM presenting with acute onset of altered mental status. Per family, they last saw her normal at midnight when she went to bed. Shortly after heard a noise and found her unresponsive on the toilet. Noted bowel and bladder incontinence. No abnormal movements noted. Was lethargic, confused with question of left arm weakness.   CT head imaging reviewed, no acute process.   HOSPITAL COURSE:   59 yo F with unwitnessed episode of LOC with post-ictal state and abnormal EEG results as above As per neurology recommendations: 1) patient has been started on Keppra 512m BID 2) Patient is unable to drive, operate heavy machinery, perform activities at heights or participate in water activities until release by outpatient physician. This was discussed with the patient who expressed understanding.  3) She should follow up with neurology as an outpatient.  Carotid duplex was found to be negative   Diabetes mellitus without complication, continue outpatient medications, PCP recommended to check hemoglobin A1c Added glipizide to metformin, patient may need to discontinue metformin if her creatinine continues to increase in  the future   Hypertension, continue outpatient medications  Patient was found to be significantly hypokalemic likely secondary to HCTZ Recommend PCP to recheck electrolytes in 3-5 days  Abnormal TSH, 0.212, free T4 was found to be normal  Urinary retention Patient required in and out catheterization Patient needs to follow-up closely with PCP, possibly urology to evaluate for urinary retention, bladder prolapse UA negative   Discharge Exam:    Blood pressure 129/93, pulse 91, temperature 98 F (36.7 C), temperature source Oral, resp. rate 18, height 5' 7"  (1.702 m), weight 61.054 kg (134 lb 9.6 oz), SpO2 100 %.         Discharge Instructions    Diet - low sodium heart healthy    Complete by:  As directed      Increase activity slowly    Complete by:  As directed            Follow-up Information    Follow up with PCP. Schedule an appointment as soon as possible for a visit in 3 days.      Follow up with [facility]. Schedule an appointment as soon as possible for a visit in 3 days.   Contact information:      [facility]  Website  Directions   Neurologist        Address: [address], [place] [address]   Phone: [phone]     Signed: AReyne Dumas7/21/2016, 11:10 AM        Time spent >45 mins

## 2015-06-07 NOTE — Progress Notes (Signed)
Chaplain followed up with pt who does not have any questions about advanced directives nor plans to complete document at this time.  Pt also indicated no spiritual care needs.  Chaplain available to follow up if/as needed.    06/07/15 0900  Clinical Encounter Type  Visited With Patient and family together  Visit Type Follow-up  Referral From Nurse  Stress Factors  Patient Stress Factors Exhausted  Family Stress Factors None identified  Advance Directives (For Healthcare)  Does patient have an advance directive? No  Would patient like information on creating an advanced directive? Yes - Educational materials given   Geralyn Flash 06/07/2015 9:59 AM

## 2015-06-07 NOTE — Progress Notes (Signed)
NCM received call from occupational therapist, stating patient now wants outpt physical therapy, NCM sent referral through  [facility] for outpt pt.  Aslo patient will need a 3 n 1.  NCM notiifed Tricia Huynh with [facility] to also bring a 3 n 1 with the cane.

## 2015-06-07 NOTE — Care Management Note (Signed)
Case Management Note  Patient Details  Name: Valoree Armenteros MRN: [id number] Date of Birth: 29-Mar-1956  Subjective/Objective:   Patient for dc today, patient will need a cane,  Referral sent to [facility] with [facility] , he will bring this up to patient's room before dc.                    Action/Plan:   Expected Discharge Date:                  Expected Discharge Plan:  Home/Self Care  In-House Referral:     Discharge planning Services  CM Consult  Post Acute Care Choice:    Choice offered to:     DME Arranged:  Kasandra Knudsen DME Agency:  [place]:    [facility] Agency:     Status of Service:  Completed, signed off  Medicare Important Message Given:    Date Medicare IM Given:    Medicare IM give by:    Date Additional Medicare IM Given:    Additional Medicare Important Message give by:     If discussed at [place] of Stay Meetings, dates discussed:    Additional Comments:  Zenon Mayo, RN 06/07/2015, 12:21 PM

## 2015-06-07 NOTE — Care Management Note (Signed)
Case Management Note  Patient Details  Name: Tricia Huynh MRN: [id number] Date of Birth: 1956/04/21  Subjective/Objective:    Patient lives with grand daughter, per physical therapy no pt f/u needed, maybe outpt physical therapy if balance deficits persite.  NCM spoke with patient , she states she does not think she needs outpatient physical therapy.  She has transportation home today.  No other needs identified.                 Action/Plan:   Expected Discharge Date:                  Expected Discharge Plan:  Home/Self Care  In-House Referral:     Discharge planning Services  CM Consult  Post Acute Care Choice:    Choice offered to:     DME Arranged:    DME Agency:     HH Arranged:    [place] Agency:     Status of Service:  Completed, signed off  Medicare Important Message Given:    Date Medicare IM Given:    Medicare IM give by:    Date Additional Medicare IM Given:    Additional Medicare Important Message give by:     If discussed at [place] of Stay Meetings, dates discussed:    Additional Comments:  Zenon Mayo, RN 06/07/2015, 11:57 AM

## 2015-06-07 NOTE — Progress Notes (Signed)
Bilateral carotid artery duplex completed:  1-39% ICA stenosis.  Vertebral artery flow is antegrade.     

## 2015-06-07 NOTE — Progress Notes (Signed)
In and out cath 350 cc yellow, clear urine noted.

## 2015-06-07 NOTE — Discharge Instructions (Signed)
Patient is unable to drive, operate heavy machinery, perform activities at heights or participate in water activities until release by outpatient physician.    She should follow up with neurology as an outpatient.

## 2015-06-07 NOTE — Progress Notes (Signed)
Patient has not voided since in and out cath last night. Patient states feeling urge to urinate but unable to void, bladder scan performed - greater than 341 mL. Schorr, NP paged

## 2015-06-07 NOTE — Progress Notes (Signed)
Pt discharged to home with family, IV dc'd Telemetry dc'd, Vitals stable for pt. Education and discharge orders explained and all questions addressed. 06/07/2015 1:44 PM Margeret Stachnik

## 2015-06-07 NOTE — Progress Notes (Signed)
Subjective: Feels back to baseline and family confirms.   Exam: Filed Vitals:   06/07/15 0618  BP: 129/93  Pulse: 91  Temp: 98 F (36.7 C)  Resp: 18   Gen: In bed, NAD Resp: non-labored breathing, no acute distress Abd: soft, nt  Neuro: MS: awake, alert, interactive and appropriate [id number], EOMI Motor: MAEW Sensory:intact to LT  Pertinent Labs: Mildly depressed GFR, but at baslien is normal.   EEG shows sharp waves.   Impression: 59 yo F with unwitnessed episode of LOC with post-ictal state and abnormal EEG  Recommendations: 1) Keppra 500mg  BID 2) Patient is unable to drive, operate heavy machinery, perform activities at heights or participate in water activities until release by outpatient physician. This was discussed with the patient who expressed understanding.  3) She should follow up with neurology as an outpatient. Please call with any further questions or concerns.   Roland Rack, MD [facility] [phone]  If 7pm- 7am, please page neurology on call as listed in [place].

## 2015-06-08 DIAGNOSIS — I129 Hypertensive chronic kidney disease with stage 1 through stage 4 chronic kidney disease, or unspecified chronic kidney disease: Secondary | ICD-10-CM | POA: Diagnosis not present

## 2015-06-08 DIAGNOSIS — E1142 Type 2 diabetes mellitus with diabetic polyneuropathy: Secondary | ICD-10-CM | POA: Diagnosis not present

## 2015-06-08 DIAGNOSIS — E1121 Type 2 diabetes mellitus with diabetic nephropathy: Secondary | ICD-10-CM | POA: Diagnosis not present

## 2015-06-08 DIAGNOSIS — N182 Chronic kidney disease, stage 2 (mild): Secondary | ICD-10-CM | POA: Diagnosis not present

## 2015-06-08 DIAGNOSIS — E1165 Type 2 diabetes mellitus with hyperglycemia: Secondary | ICD-10-CM | POA: Diagnosis not present

## 2015-06-08 DIAGNOSIS — F324 Major depressive disorder, single episode, in partial remission: Secondary | ICD-10-CM | POA: Diagnosis not present

## 2015-06-08 LAB — HEMOGLOBIN A1C
Hgb A1c MFr Bld: 11.6 % — ABNORMAL HIGH (ref 4.8–5.6)
Mean Plasma Glucose: 286 mg/dL

## 2015-06-12 DIAGNOSIS — E1121 Type 2 diabetes mellitus with diabetic nephropathy: Secondary | ICD-10-CM | POA: Diagnosis not present

## 2015-06-12 DIAGNOSIS — E1142 Type 2 diabetes mellitus with diabetic polyneuropathy: Secondary | ICD-10-CM | POA: Diagnosis not present

## 2015-06-12 DIAGNOSIS — I129 Hypertensive chronic kidney disease with stage 1 through stage 4 chronic kidney disease, or unspecified chronic kidney disease: Secondary | ICD-10-CM | POA: Diagnosis not present

## 2015-06-12 DIAGNOSIS — N182 Chronic kidney disease, stage 2 (mild): Secondary | ICD-10-CM | POA: Diagnosis not present

## 2015-06-12 DIAGNOSIS — F324 Major depressive disorder, single episode, in partial remission: Secondary | ICD-10-CM | POA: Diagnosis not present

## 2015-06-12 DIAGNOSIS — E1165 Type 2 diabetes mellitus with hyperglycemia: Secondary | ICD-10-CM | POA: Diagnosis not present

## 2015-06-20 DIAGNOSIS — E1121 Type 2 diabetes mellitus with diabetic nephropathy: Secondary | ICD-10-CM | POA: Diagnosis not present

## 2015-06-20 DIAGNOSIS — F324 Major depressive disorder, single episode, in partial remission: Secondary | ICD-10-CM | POA: Diagnosis not present

## 2015-06-20 DIAGNOSIS — I129 Hypertensive chronic kidney disease with stage 1 through stage 4 chronic kidney disease, or unspecified chronic kidney disease: Secondary | ICD-10-CM | POA: Diagnosis not present

## 2015-06-20 DIAGNOSIS — E1165 Type 2 diabetes mellitus with hyperglycemia: Secondary | ICD-10-CM | POA: Diagnosis not present

## 2015-06-20 DIAGNOSIS — N182 Chronic kidney disease, stage 2 (mild): Secondary | ICD-10-CM | POA: Diagnosis not present

## 2015-06-20 DIAGNOSIS — E1142 Type 2 diabetes mellitus with diabetic polyneuropathy: Secondary | ICD-10-CM | POA: Diagnosis not present

## 2015-06-22 DIAGNOSIS — R1900 Intra-abdominal and pelvic swelling, mass and lump, unspecified site: Secondary | ICD-10-CM | POA: Diagnosis not present

## 2015-06-22 DIAGNOSIS — E1121 Type 2 diabetes mellitus with diabetic nephropathy: Secondary | ICD-10-CM | POA: Diagnosis not present

## 2015-06-22 DIAGNOSIS — E1165 Type 2 diabetes mellitus with hyperglycemia: Secondary | ICD-10-CM | POA: Diagnosis not present

## 2015-06-22 DIAGNOSIS — N182 Chronic kidney disease, stage 2 (mild): Secondary | ICD-10-CM | POA: Diagnosis not present

## 2015-06-22 DIAGNOSIS — I129 Hypertensive chronic kidney disease with stage 1 through stage 4 chronic kidney disease, or unspecified chronic kidney disease: Secondary | ICD-10-CM | POA: Diagnosis not present

## 2015-06-22 DIAGNOSIS — F324 Major depressive disorder, single episode, in partial remission: Secondary | ICD-10-CM | POA: Diagnosis not present

## 2015-06-22 DIAGNOSIS — I10 Essential (primary) hypertension: Secondary | ICD-10-CM | POA: Diagnosis not present

## 2015-06-22 DIAGNOSIS — E1142 Type 2 diabetes mellitus with diabetic polyneuropathy: Secondary | ICD-10-CM | POA: Diagnosis not present

## 2015-06-25 ENCOUNTER — Other Ambulatory Visit: Payer: Self-pay

## 2015-06-25 DIAGNOSIS — Z1231 Encounter for screening mammogram for malignant neoplasm of breast: Secondary | ICD-10-CM

## 2015-06-27 DIAGNOSIS — E1142 Type 2 diabetes mellitus with diabetic polyneuropathy: Secondary | ICD-10-CM | POA: Diagnosis not present

## 2015-06-27 DIAGNOSIS — N182 Chronic kidney disease, stage 2 (mild): Secondary | ICD-10-CM | POA: Diagnosis not present

## 2015-06-27 DIAGNOSIS — E1165 Type 2 diabetes mellitus with hyperglycemia: Secondary | ICD-10-CM | POA: Diagnosis not present

## 2015-06-27 DIAGNOSIS — E1121 Type 2 diabetes mellitus with diabetic nephropathy: Secondary | ICD-10-CM | POA: Diagnosis not present

## 2015-06-27 DIAGNOSIS — F324 Major depressive disorder, single episode, in partial remission: Secondary | ICD-10-CM | POA: Diagnosis not present

## 2015-06-27 DIAGNOSIS — I129 Hypertensive chronic kidney disease with stage 1 through stage 4 chronic kidney disease, or unspecified chronic kidney disease: Secondary | ICD-10-CM | POA: Diagnosis not present

## 2015-06-29 DIAGNOSIS — N182 Chronic kidney disease, stage 2 (mild): Secondary | ICD-10-CM | POA: Diagnosis not present

## 2015-06-29 DIAGNOSIS — F324 Major depressive disorder, single episode, in partial remission: Secondary | ICD-10-CM | POA: Diagnosis not present

## 2015-06-29 DIAGNOSIS — I129 Hypertensive chronic kidney disease with stage 1 through stage 4 chronic kidney disease, or unspecified chronic kidney disease: Secondary | ICD-10-CM | POA: Diagnosis not present

## 2015-06-29 DIAGNOSIS — E1165 Type 2 diabetes mellitus with hyperglycemia: Secondary | ICD-10-CM | POA: Diagnosis not present

## 2015-06-29 DIAGNOSIS — E1121 Type 2 diabetes mellitus with diabetic nephropathy: Secondary | ICD-10-CM | POA: Diagnosis not present

## 2015-06-29 DIAGNOSIS — E1142 Type 2 diabetes mellitus with diabetic polyneuropathy: Secondary | ICD-10-CM | POA: Diagnosis not present

## 2015-07-04 ENCOUNTER — Ambulatory Visit (INDEPENDENT_AMBULATORY_CARE_PROVIDER_SITE_OTHER): Payer: Medicare Other | Admitting: Neurology

## 2015-07-04 ENCOUNTER — Encounter: Payer: Self-pay | Admitting: Neurology

## 2015-07-04 VITALS — BP 134/93 | HR 97 | Ht 67.0 in | Wt 139.0 lb

## 2015-07-04 DIAGNOSIS — R569 Unspecified convulsions: Secondary | ICD-10-CM

## 2015-07-04 DIAGNOSIS — R413 Other amnesia: Secondary | ICD-10-CM | POA: Diagnosis not present

## 2015-07-04 MED ORDER — LEVETIRACETAM 500 MG PO TABS: 500.0000 mg | ORAL_TABLET | Freq: Two times a day (BID) | ORAL | Status: DC

## 2015-07-04 NOTE — Progress Notes (Signed)
PATIENT: Tricia Huynh DOB: 1956-01-05  Chief Complaint  Patient presents with  . Encephalopathy    MMSE 23/30 - 12 animals. Says she got up during the night to use the bathroom and passed out.  Her children found her in the floor unconscious and called an ambulance.  Reports this to be an isolated event.     HISTORICAL  Tricia Huynh is a 59 years old right-handed female, seen in refer by  her primary care physician Dr. Audley Hose for evaluation of memory trouble, seizure she took scat transportation to office, alone at today's clinical visit  She had a history of insulin-dependent diabetes, hypertension, hyperlipidemia, lives with her granddaughters, she has been on disability due to her depression since 1998, had 10 years education, used to work at [employer],  She was admitted to the hospital from July 20 to 21 2016, was found down at home, confused, with bowel and bladder incontinence, she was seen by neuro hospitalists, abnormal EEG in July 2016, Focal slowing over the occipital regions, left greater than right, Posterior-dominant photoepileptiform response, asymmetric more on the left occipital region, she was started on Keppra 500 mg twice a day, no recurrent seizure event  I have reviewed MRI of the brain without contrast of July 2016:1. No acute intracranial infarct or other abnormality identified. Mild age-related cerebral atrophy. 2.5 cm well-circumscribed ovoid lesion within the left occipital scalp. This lesion is indeterminate, but most certainly benign.  Laboratory evaluation, creatinine 1. 3, low TSH 0.2 CBC showed mild anemia hemoglobin 11 point 5, negative ammonia, UDS, A1c 11. 8,   REVIEW OF SYSTEMS: Full 14 system review of systems performed and notable only for as above  ALLERGIES: Allergies  Allergen Reactions  . Percocet [Oxycodone-Acetaminophen] Nausea And Vomiting and Other (See Comments)    Shaky/unsteady.  . Vicodin  [Hydrocodone-Acetaminophen] Nausea And Vomiting and Other (See Comments)    Shaky/unsteady.    HOME MEDICATIONS: Current Outpatient Prescriptions  Medication Sig Dispense Refill  . amitriptyline (ELAVIL) 50 MG tablet Take 50 mg by mouth at bedtime.  1  . amLODipine-olmesartan (AZOR) 5-40 MG per tablet Take 1 tablet by mouth daily.    Marland Kitchen aspirin-acetaminophen-caffeine (EXCEDRIN MIGRAINE) 250-250-65 MG per tablet Take 2 tablets by mouth every 6 (six) hours as needed for headache.    Marland Kitchen atorvastatin (LIPITOR) 40 MG tablet Take 40 mg by mouth daily.  1  . gabapentin (NEURONTIN) 600 MG tablet Take 600 mg by mouth 2 (two) times daily.   0  . glipiZIDE (GLUCOTROL) 5 MG tablet Take 0.5 tablets (2.5 mg total) by mouth daily before breakfast. 30 tablet 2  . hydrochlorothiazide (HYDRODIURIL) 25 MG tablet Take 1 tablet (25 mg total) by mouth daily. 30 tablet 0  . LANTUS SOLOSTAR 100 UNIT/ML Solostar Pen Inject 20-50 Units into the skin at bedtime.   0  . levETIRAcetam (KEPPRA) 500 MG tablet Take 1 tablet (500 mg total) by mouth 2 (two) times daily. 60 tablet 3  . metFORMIN (GLUCOPHAGE) 500 MG tablet Take 500 mg by mouth 2 (two) times daily.  0  . metoprolol succinate (TOPROL-XL) 25 MG 24 hr tablet Take 50 mg by mouth at bedtime.  1  . NOVOLOG FLEXPEN 100 UNIT/ML FlexPen Inject 20-50 Units into the skin 2 (two) times daily. 50 units in am and 20 units in pm  0  . ranitidine (ZANTAC) 150 MG tablet Take 1 tablet (150 mg total) by mouth 2 (two) times daily. 28 tablet 0  .  sucralfate (CARAFATE) 1 G tablet Take 1 tablet (1 g total) by mouth 4 (four) times daily -  with meals and at bedtime. 30 tablet 0  . Vitamin D, Ergocalciferol, (DRISDOL) 50000 UNITS CAPS capsule Take 50,000 Units by mouth every 7 (seven) days. On Mondays     No current facility-administered medications for this visit.    PAST MEDICAL HISTORY: Past Medical History  Diagnosis Date  . Diabetes mellitus without complication   .  Hypertension   . Mood disorder 06/06/2015  . Essential hypertension 06/06/2015  . Dyslipidemia 06/06/2015  . Diabetic neuropathy 06/06/2015  . Diabetes mellitus type 2 in nonobese 06/06/2015  . Encephalopathy     PAST SURGICAL HISTORY: Past Surgical History  Procedure Laterality Date  . Abdominal hysterectomy    . Tubal ligation    . Eye surgery Bilateral     FAMILY HISTORY: Family History  Problem Relation Age of Onset  . Heart disease Father   . Kidney disease Father   . Diabetes Mother     SOCIAL HISTORY:  Social History   Social History  . Marital Status: Single    Spouse Name: N/A  . Number of Children: 3  . Years of Education: 10   Occupational History  . Disabled    Social History Main Topics  . Smoking status: Former Research scientist (life sciences)  . Smokeless tobacco: Never Used  . Alcohol Use: No  . Drug Use: No  . Sexual Activity: Yes   Other Topics Concern  . Not on file   Social History Narrative   Lives at home with her grandchildren.   Right-handed.   No caffeine use.    PHYSICAL EXAM   Filed Vitals:   07/04/15 1124  BP: 134/93  Pulse: 97  Height: 5\' 7"  (1.702 m)  Weight: 139 lb (63.05 kg)    Not recorded      Body mass index is 21.77 kg/(m^2).  PHYSICAL EXAMNIATION:  Gen: NAD, conversant, well nourised, obese, well groomed                     Cardiovascular: Regular rate rhythm, no peripheral edema, warm, nontender. Eyes: Conjunctivae clear without exudates or hemorrhage Neck: Supple, no carotid bruise. Pulmonary: Clear to auscultation bilaterally   NEUROLOGICAL EXAM:  MENTAL STATUS: Speech:    Speech is normal; fluent and spontaneous with normal comprehension.  Cognition:Mini-Mental Status Examination is 23 out of 30, animal naming 12  She is not oriented to [place],  Recent and remote memory missed one out of 3 recalls  Attention span and concentration, has difficulties low world backwards  Normal Language, naming, repeating,spontaneous  speech  Has difficulty copy figure   CRANIAL NERVES: CN II: Visual fields are full to confrontation. Fundoscopic exam is normal with sharp discs and no vascular changes. Pupils are round equal and briskly reactive to light. CN III, IV, VI: extraocular movement are normal. No ptosis. CN V: Facial sensation is intact to pinprick in all 3 divisions bilaterally. Corneal responses are intact.  CN VII: Face is symmetric with normal eye closure and smile. CN VIII: Hearing is normal to rubbing fingers CN IX, X: Palate elevates symmetrically. Phonation is normal. CN XI: Head turning and shoulder shrug are intact CN XII: Tongue is midline with normal movements and no atrophy.  MOTOR: There is no pronator drift of out-stretched arms. Muscle bulk and tone are normal. Muscle strength is normal.  REFLEXES: Reflexes are 2+ and symmetric at the biceps, triceps, knees,  and ankles. Plantar responses are flexor.  SENSORY: Intact to light touch, pinprick, position sense, and vibration sense are intact in fingers and toes.  COORDINATION: Rapid alternating movements and fine finger movements are intact. There is no dysmetria on finger-to-nose and heel-knee-shin.    GAIT/STANCE: Mild stiff, unsteady gait   DIAGNOSTIC DATA (LABS, IMAGING, TESTING) - I reviewed patient records, labs, notes, testing and imaging myself where available.   ASSESSMENT AND PLAN  Tricia Huynh is a 59 y.o. female   Seizure  Refill Keppra 500 mg twice a day   memory loss   Mini-Mental Status Examination is 23 out of 30 today  Central nervous system degenerative disorder, her mood disorder likely play a role as well  Return to clinic with her family in 3 months  Marcial Pacas, M.D. Ph.D.  [facility] [address], [place], [place] [address] Ph: [phone] Fax: [phone]  CC: Dr. Audley Hose

## 2015-07-05 DIAGNOSIS — I129 Hypertensive chronic kidney disease with stage 1 through stage 4 chronic kidney disease, or unspecified chronic kidney disease: Secondary | ICD-10-CM | POA: Diagnosis not present

## 2015-07-05 DIAGNOSIS — F324 Major depressive disorder, single episode, in partial remission: Secondary | ICD-10-CM | POA: Diagnosis not present

## 2015-07-05 DIAGNOSIS — E1121 Type 2 diabetes mellitus with diabetic nephropathy: Secondary | ICD-10-CM | POA: Diagnosis not present

## 2015-07-05 DIAGNOSIS — E1142 Type 2 diabetes mellitus with diabetic polyneuropathy: Secondary | ICD-10-CM | POA: Diagnosis not present

## 2015-07-05 DIAGNOSIS — N182 Chronic kidney disease, stage 2 (mild): Secondary | ICD-10-CM | POA: Diagnosis not present

## 2015-07-05 DIAGNOSIS — E1165 Type 2 diabetes mellitus with hyperglycemia: Secondary | ICD-10-CM | POA: Diagnosis not present

## 2015-07-06 ENCOUNTER — Ambulatory Visit
Admission: RE | Admit: 2015-07-06 | Discharge: 2015-07-06 | Disposition: A | Discharge status: Home / Self Care | Payer: Medicare Other | Source: Ambulatory Visit

## 2015-07-06 DIAGNOSIS — Z1231 Encounter for screening mammogram for malignant neoplasm of breast: Secondary | ICD-10-CM | POA: Diagnosis not present

## 2015-07-10 DIAGNOSIS — I129 Hypertensive chronic kidney disease with stage 1 through stage 4 chronic kidney disease, or unspecified chronic kidney disease: Secondary | ICD-10-CM | POA: Diagnosis not present

## 2015-07-10 DIAGNOSIS — F324 Major depressive disorder, single episode, in partial remission: Secondary | ICD-10-CM | POA: Diagnosis not present

## 2015-07-10 DIAGNOSIS — E1142 Type 2 diabetes mellitus with diabetic polyneuropathy: Secondary | ICD-10-CM | POA: Diagnosis not present

## 2015-07-10 DIAGNOSIS — E1165 Type 2 diabetes mellitus with hyperglycemia: Secondary | ICD-10-CM | POA: Diagnosis not present

## 2015-07-10 DIAGNOSIS — E1121 Type 2 diabetes mellitus with diabetic nephropathy: Secondary | ICD-10-CM | POA: Diagnosis not present

## 2015-07-10 DIAGNOSIS — N182 Chronic kidney disease, stage 2 (mild): Secondary | ICD-10-CM | POA: Diagnosis not present

## 2015-07-11 ENCOUNTER — Other Ambulatory Visit: Payer: Self-pay | Admitting: Family Medicine

## 2015-07-11 DIAGNOSIS — R1900 Intra-abdominal and pelvic swelling, mass and lump, unspecified site: Secondary | ICD-10-CM

## 2015-07-17 ENCOUNTER — Ambulatory Visit
Admission: RE | Admit: 2015-07-17 | Discharge: 2015-07-17 | Disposition: A | Discharge status: Home / Self Care | Payer: Medicare Other | Source: Ambulatory Visit | Attending: Family Medicine | Admitting: Family Medicine

## 2015-07-17 DIAGNOSIS — R1903 Right lower quadrant abdominal swelling, mass and lump: Secondary | ICD-10-CM | POA: Diagnosis not present

## 2015-07-17 DIAGNOSIS — R1900 Intra-abdominal and pelvic swelling, mass and lump, unspecified site: Secondary | ICD-10-CM

## 2015-07-18 DIAGNOSIS — E1165 Type 2 diabetes mellitus with hyperglycemia: Secondary | ICD-10-CM | POA: Diagnosis not present

## 2015-07-18 DIAGNOSIS — N182 Chronic kidney disease, stage 2 (mild): Secondary | ICD-10-CM | POA: Diagnosis not present

## 2015-07-18 DIAGNOSIS — E1142 Type 2 diabetes mellitus with diabetic polyneuropathy: Secondary | ICD-10-CM | POA: Diagnosis not present

## 2015-07-18 DIAGNOSIS — F324 Major depressive disorder, single episode, in partial remission: Secondary | ICD-10-CM | POA: Diagnosis not present

## 2015-07-18 DIAGNOSIS — I129 Hypertensive chronic kidney disease with stage 1 through stage 4 chronic kidney disease, or unspecified chronic kidney disease: Secondary | ICD-10-CM | POA: Diagnosis not present

## 2015-07-18 DIAGNOSIS — E1121 Type 2 diabetes mellitus with diabetic nephropathy: Secondary | ICD-10-CM | POA: Diagnosis not present

## 2015-07-26 DIAGNOSIS — N182 Chronic kidney disease, stage 2 (mild): Secondary | ICD-10-CM | POA: Diagnosis not present

## 2015-07-26 DIAGNOSIS — E1165 Type 2 diabetes mellitus with hyperglycemia: Secondary | ICD-10-CM | POA: Diagnosis not present

## 2015-07-26 DIAGNOSIS — I129 Hypertensive chronic kidney disease with stage 1 through stage 4 chronic kidney disease, or unspecified chronic kidney disease: Secondary | ICD-10-CM | POA: Diagnosis not present

## 2015-07-26 DIAGNOSIS — E1121 Type 2 diabetes mellitus with diabetic nephropathy: Secondary | ICD-10-CM | POA: Diagnosis not present

## 2015-07-26 DIAGNOSIS — F324 Major depressive disorder, single episode, in partial remission: Secondary | ICD-10-CM | POA: Diagnosis not present

## 2015-07-26 DIAGNOSIS — E1142 Type 2 diabetes mellitus with diabetic polyneuropathy: Secondary | ICD-10-CM | POA: Diagnosis not present

## 2015-08-01 DIAGNOSIS — I129 Hypertensive chronic kidney disease with stage 1 through stage 4 chronic kidney disease, or unspecified chronic kidney disease: Secondary | ICD-10-CM | POA: Diagnosis not present

## 2015-08-01 DIAGNOSIS — E1142 Type 2 diabetes mellitus with diabetic polyneuropathy: Secondary | ICD-10-CM | POA: Diagnosis not present

## 2015-08-01 DIAGNOSIS — N182 Chronic kidney disease, stage 2 (mild): Secondary | ICD-10-CM | POA: Diagnosis not present

## 2015-08-01 DIAGNOSIS — F324 Major depressive disorder, single episode, in partial remission: Secondary | ICD-10-CM | POA: Diagnosis not present

## 2015-08-01 DIAGNOSIS — E1121 Type 2 diabetes mellitus with diabetic nephropathy: Secondary | ICD-10-CM | POA: Diagnosis not present

## 2015-08-01 DIAGNOSIS — E1165 Type 2 diabetes mellitus with hyperglycemia: Secondary | ICD-10-CM | POA: Diagnosis not present

## 2015-08-04 DIAGNOSIS — E1121 Type 2 diabetes mellitus with diabetic nephropathy: Secondary | ICD-10-CM | POA: Diagnosis not present

## 2015-08-04 DIAGNOSIS — E1142 Type 2 diabetes mellitus with diabetic polyneuropathy: Secondary | ICD-10-CM | POA: Diagnosis not present

## 2015-08-04 DIAGNOSIS — N182 Chronic kidney disease, stage 2 (mild): Secondary | ICD-10-CM | POA: Diagnosis not present

## 2015-08-04 DIAGNOSIS — I129 Hypertensive chronic kidney disease with stage 1 through stage 4 chronic kidney disease, or unspecified chronic kidney disease: Secondary | ICD-10-CM | POA: Diagnosis not present

## 2015-08-04 DIAGNOSIS — F324 Major depressive disorder, single episode, in partial remission: Secondary | ICD-10-CM | POA: Diagnosis not present

## 2015-08-04 DIAGNOSIS — E1122 Type 2 diabetes mellitus with diabetic chronic kidney disease: Secondary | ICD-10-CM | POA: Diagnosis not present

## 2015-08-08 DIAGNOSIS — E1142 Type 2 diabetes mellitus with diabetic polyneuropathy: Secondary | ICD-10-CM | POA: Diagnosis not present

## 2015-08-08 DIAGNOSIS — E1122 Type 2 diabetes mellitus with diabetic chronic kidney disease: Secondary | ICD-10-CM | POA: Diagnosis not present

## 2015-08-08 DIAGNOSIS — F324 Major depressive disorder, single episode, in partial remission: Secondary | ICD-10-CM | POA: Diagnosis not present

## 2015-08-08 DIAGNOSIS — E1121 Type 2 diabetes mellitus with diabetic nephropathy: Secondary | ICD-10-CM | POA: Diagnosis not present

## 2015-08-08 DIAGNOSIS — I129 Hypertensive chronic kidney disease with stage 1 through stage 4 chronic kidney disease, or unspecified chronic kidney disease: Secondary | ICD-10-CM | POA: Diagnosis not present

## 2015-08-08 DIAGNOSIS — N182 Chronic kidney disease, stage 2 (mild): Secondary | ICD-10-CM | POA: Diagnosis not present

## 2015-08-09 DIAGNOSIS — E782 Mixed hyperlipidemia: Secondary | ICD-10-CM | POA: Diagnosis not present

## 2015-08-09 DIAGNOSIS — E785 Hyperlipidemia, unspecified: Secondary | ICD-10-CM | POA: Diagnosis not present

## 2015-08-09 DIAGNOSIS — Z23 Encounter for immunization: Secondary | ICD-10-CM | POA: Diagnosis not present

## 2015-08-09 DIAGNOSIS — E1165 Type 2 diabetes mellitus with hyperglycemia: Secondary | ICD-10-CM | POA: Diagnosis not present

## 2015-08-16 DIAGNOSIS — F324 Major depressive disorder, single episode, in partial remission: Secondary | ICD-10-CM | POA: Diagnosis not present

## 2015-08-16 DIAGNOSIS — E1142 Type 2 diabetes mellitus with diabetic polyneuropathy: Secondary | ICD-10-CM | POA: Diagnosis not present

## 2015-08-16 DIAGNOSIS — E1122 Type 2 diabetes mellitus with diabetic chronic kidney disease: Secondary | ICD-10-CM | POA: Diagnosis not present

## 2015-08-16 DIAGNOSIS — I129 Hypertensive chronic kidney disease with stage 1 through stage 4 chronic kidney disease, or unspecified chronic kidney disease: Secondary | ICD-10-CM | POA: Diagnosis not present

## 2015-08-16 DIAGNOSIS — N182 Chronic kidney disease, stage 2 (mild): Secondary | ICD-10-CM | POA: Diagnosis not present

## 2015-08-16 DIAGNOSIS — E1121 Type 2 diabetes mellitus with diabetic nephropathy: Secondary | ICD-10-CM | POA: Diagnosis not present

## 2015-08-22 DIAGNOSIS — E1122 Type 2 diabetes mellitus with diabetic chronic kidney disease: Secondary | ICD-10-CM | POA: Diagnosis not present

## 2015-08-22 DIAGNOSIS — E1121 Type 2 diabetes mellitus with diabetic nephropathy: Secondary | ICD-10-CM | POA: Diagnosis not present

## 2015-08-22 DIAGNOSIS — N182 Chronic kidney disease, stage 2 (mild): Secondary | ICD-10-CM | POA: Diagnosis not present

## 2015-08-22 DIAGNOSIS — F324 Major depressive disorder, single episode, in partial remission: Secondary | ICD-10-CM | POA: Diagnosis not present

## 2015-08-22 DIAGNOSIS — E1142 Type 2 diabetes mellitus with diabetic polyneuropathy: Secondary | ICD-10-CM | POA: Diagnosis not present

## 2015-08-22 DIAGNOSIS — I129 Hypertensive chronic kidney disease with stage 1 through stage 4 chronic kidney disease, or unspecified chronic kidney disease: Secondary | ICD-10-CM | POA: Diagnosis not present

## 2015-09-06 DIAGNOSIS — I129 Hypertensive chronic kidney disease with stage 1 through stage 4 chronic kidney disease, or unspecified chronic kidney disease: Secondary | ICD-10-CM | POA: Diagnosis not present

## 2015-09-06 DIAGNOSIS — F324 Major depressive disorder, single episode, in partial remission: Secondary | ICD-10-CM | POA: Diagnosis not present

## 2015-09-06 DIAGNOSIS — E1122 Type 2 diabetes mellitus with diabetic chronic kidney disease: Secondary | ICD-10-CM | POA: Diagnosis not present

## 2015-09-06 DIAGNOSIS — N182 Chronic kidney disease, stage 2 (mild): Secondary | ICD-10-CM | POA: Diagnosis not present

## 2015-09-06 DIAGNOSIS — E1142 Type 2 diabetes mellitus with diabetic polyneuropathy: Secondary | ICD-10-CM | POA: Diagnosis not present

## 2015-09-06 DIAGNOSIS — E1121 Type 2 diabetes mellitus with diabetic nephropathy: Secondary | ICD-10-CM | POA: Diagnosis not present

## 2015-09-18 ENCOUNTER — Emergency Department (HOSPITAL_COMMUNITY): Payer: Medicare Other

## 2015-09-18 ENCOUNTER — Emergency Department (HOSPITAL_COMMUNITY)
Admission: EM | Admit: 2015-09-18 | Discharge: 2015-09-18 | Disposition: A | Discharge status: Home / Self Care | Payer: Medicare Other | Attending: Emergency Medicine | Admitting: Emergency Medicine

## 2015-09-18 ENCOUNTER — Encounter (HOSPITAL_COMMUNITY): Payer: Self-pay | Admitting: *Deleted

## 2015-09-18 DIAGNOSIS — Z87891 Personal history of nicotine dependence: Secondary | ICD-10-CM | POA: Diagnosis not present

## 2015-09-18 DIAGNOSIS — I10 Essential (primary) hypertension: Secondary | ICD-10-CM | POA: Insufficient documentation

## 2015-09-18 DIAGNOSIS — R4182 Altered mental status, unspecified: Secondary | ICD-10-CM | POA: Diagnosis not present

## 2015-09-18 DIAGNOSIS — R300 Dysuria: Secondary | ICD-10-CM | POA: Diagnosis not present

## 2015-09-18 DIAGNOSIS — Z8659 Personal history of other mental and behavioral disorders: Secondary | ICD-10-CM | POA: Diagnosis not present

## 2015-09-18 DIAGNOSIS — E119 Type 2 diabetes mellitus without complications: Secondary | ICD-10-CM | POA: Diagnosis not present

## 2015-09-18 DIAGNOSIS — N390 Urinary tract infection, site not specified: Secondary | ICD-10-CM | POA: Insufficient documentation

## 2015-09-18 DIAGNOSIS — R55 Syncope and collapse: Secondary | ICD-10-CM | POA: Diagnosis not present

## 2015-09-18 DIAGNOSIS — Z8669 Personal history of other diseases of the nervous system and sense organs: Secondary | ICD-10-CM | POA: Insufficient documentation

## 2015-09-18 DIAGNOSIS — Z79899 Other long term (current) drug therapy: Secondary | ICD-10-CM | POA: Insufficient documentation

## 2015-09-18 DIAGNOSIS — R404 Transient alteration of awareness: Secondary | ICD-10-CM | POA: Diagnosis not present

## 2015-09-18 DIAGNOSIS — E785 Hyperlipidemia, unspecified: Secondary | ICD-10-CM | POA: Insufficient documentation

## 2015-09-18 LAB — COMPREHENSIVE METABOLIC PANEL
ALT: 13 U/L — ABNORMAL LOW (ref 14–54)
AST: 18 U/L (ref 15–41)
Albumin: 3.7 g/dL (ref 3.5–5.0)
Alkaline Phosphatase: 147 U/L — ABNORMAL HIGH (ref 38–126)
Anion gap: 9 (ref 5–15)
BUN: 17 mg/dL (ref 6–20)
CO2: 26 mmol/L (ref 22–32)
Calcium: 9.6 mg/dL (ref 8.9–10.3)
Chloride: 103 mmol/L (ref 101–111)
Creatinine, Ser: 0.96 mg/dL (ref 0.44–1.00)
GFR calc Af Amer: >60 mL/min (ref >60)
GFR calc non Af Amer: >60 mL/min (ref >60)
Glucose, Bld: 311 mg/dL — ABNORMAL HIGH (ref 65–99)
Potassium: 4.4 mmol/L (ref 3.5–5.1)
Sodium: 138 mmol/L (ref 135–145)
Total Bilirubin: 0.4 mg/dL (ref 0.3–1.2)
Total Protein: 7.1 g/dL (ref 6.5–8.1)

## 2015-09-18 LAB — CBC WITH DIFFERENTIAL/PLATELET
Basophils Absolute: 0 10*3/uL (ref 0.0–0.1)
Basophils Relative: 0 %
Eosinophils Absolute: 0 10*3/uL (ref 0.0–0.7)
Eosinophils Relative: 0 %
HCT: 29 % — ABNORMAL LOW (ref 36.0–46.0)
Hemoglobin: 10 g/dL — ABNORMAL LOW (ref 12.0–15.0)
Lymphocytes Relative: 29 %
Lymphs Abs: 1.3 10*3/uL (ref 0.7–4.0)
MCH: 29.6 pg (ref 26.0–34.0)
MCHC: 34.5 g/dL (ref 30.0–36.0)
MCV: 85.8 fL (ref 78.0–100.0)
Monocytes Absolute: 0.3 10*3/uL (ref 0.1–1.0)
Monocytes Relative: 5 %
Neutro Abs: 3 10*3/uL (ref 1.7–7.7)
Neutrophils Relative %: 66 %
Platelets: 229 10*3/uL (ref 150–400)
RBC: 3.38 MIL/uL — ABNORMAL LOW (ref 3.87–5.11)
RDW: 13.1 % (ref 11.5–15.5)
WBC: 4.6 10*3/uL (ref 4.0–10.5)

## 2015-09-18 LAB — URINALYSIS, ROUTINE W REFLEX MICROSCOPIC
Bilirubin Urine: NEGATIVE
Glucose, UA: >1000 mg/dL — AB
Hgb urine dipstick: NEGATIVE
Ketones, ur: NEGATIVE mg/dL
Nitrite: POSITIVE — AB
Protein, ur: NEGATIVE mg/dL
Specific Gravity, Urine: 1.018 (ref 1.005–1.030)
Urobilinogen, UA: 0.2 mg/dL (ref 0.0–1.0)
pH: 5 (ref 5.0–8.0)

## 2015-09-18 LAB — I-STAT TROPONIN, ED: Troponin i, poc: 0 ng/mL (ref 0.00–0.08)

## 2015-09-18 LAB — URINE MICROSCOPIC-ADD ON

## 2015-09-18 LAB — MAGNESIUM: Magnesium: 1.8 mg/dL (ref 1.7–2.4)

## 2015-09-18 MED ORDER — HYDROCHLOROTHIAZIDE 25 MG PO TABS
25.0000 mg | ORAL_TABLET | Freq: Every day | ORAL | Status: DC
  Administered 2015-09-18: 25 mg via ORAL
  Filled 2015-09-18 (×2): qty 1

## 2015-09-18 MED ORDER — AMLODIPINE-OLMESARTAN 5-40 MG PO TABS: 1.0000 | ORAL_TABLET | Freq: Every day | ORAL | Status: DC

## 2015-09-18 MED ORDER — IRBESARTAN 300 MG PO TABS
300.0000 mg | ORAL_TABLET | Freq: Every day | ORAL | Status: DC
  Administered 2015-09-18: 300 mg via ORAL
  Filled 2015-09-18: qty 1

## 2015-09-18 MED ORDER — SODIUM CHLORIDE 0.9 % IV SOLN
1000.0000 mg | Freq: Once | INTRAVENOUS | Status: AC
  Administered 2015-09-18: 1000 mg via INTRAVENOUS
  Filled 2015-09-18: qty 10

## 2015-09-18 MED ORDER — CEPHALEXIN 500 MG PO CAPS: 500.0000 mg | ORAL_CAPSULE | Freq: Four times a day (QID) | ORAL | Status: AC

## 2015-09-18 MED ORDER — AMLODIPINE BESYLATE 5 MG PO TABS
5.0000 mg | ORAL_TABLET | Freq: Every day | ORAL | Status: DC
  Administered 2015-09-18: 5 mg via ORAL
  Filled 2015-09-18: qty 1

## 2015-09-18 MED ORDER — SODIUM CHLORIDE 0.9 % IV BOLUS (SEPSIS)
1000.0000 mL | Freq: Once | INTRAVENOUS | Status: AC
  Administered 2015-09-18: 1000 mL via INTRAVENOUS

## 2015-09-18 NOTE — ED Notes (Signed)
Pt is in stable condition upon d/c and is escorted from ED via wheelchair. 

## 2015-09-18 NOTE — ED Notes (Signed)
Pt arrives from home via [facility]. Pt had a syncopal episode today upon waking up. Pt has a hx of seizures, but family reports not noticing any tonic clonic motions. Pt seemed post ictal upon arrival and implied she was unable to speak. Upon much persuasion pt began speaking clearly with no hesitation and is a&ox4. Pt was unable to take morning meds rt syncopal episode.

## 2015-09-18 NOTE — ED Provider Notes (Signed)
CSN: [id number]     Arrival date & time 09/18/15  1140 History   First MD Initiated Contact with Patient 09/18/15 1145     Chief Complaint  Patient presents with  . Loss of Consciousness     (Consider location/radiation/quality/duration/timing/severity/associated sxs/prior Treatment) HPI Comments: Patient became upset while with family, She was shaking, shaking like nerves were bad, upset Didn't look like typical seizures, concern her nerves were bad (per daughter) Stood up, walked then passed out in living room then passed out, stood up out for a few seconds before she opened her eyes, no shaking or seizure like activity  Then she appeared out of it, but wasn't speaking Then called ambulance When she gets nervous or anxious she passes out per family and will be slow to speak following episodes. They report several episodes of the same in the past.      Patient is a 59 y.o. female presenting with syncope.  Loss of Consciousness Associated symptoms: no chest pain, no dizziness, no fever, no headaches, no nausea, no shortness of breath, no vomiting and no weakness  Seizures: possible.     Past Medical History  Diagnosis Date  . Diabetes mellitus without complication ([place])   . Hypertension   . Mood disorder ([place]) 06/06/2015  . Essential hypertension 06/06/2015  . Dyslipidemia 06/06/2015  . Diabetic neuropathy ([place]) 06/06/2015  . Diabetes mellitus type 2 in nonobese ([place]) 06/06/2015  . Encephalopathy    Past Surgical History  Procedure Laterality Date  . Abdominal hysterectomy    . Tubal ligation    . Eye surgery Bilateral    Family History  Problem Relation Age of Onset  . Heart disease Father   . Kidney disease Father   . Diabetes Mother    Social History  Substance Use Topics  . Smoking status: Former Research scientist (life sciences)  . Smokeless tobacco: Never Used  . Alcohol Use: No   OB History    No data available     Review of Systems  Constitutional: Negative for fever.  HENT:  Negative for sore throat.   Eyes: Negative for visual disturbance.  Respiratory: Negative for cough and shortness of breath.   Cardiovascular: Positive for syncope. Negative for chest pain.  Gastrointestinal: Negative for nausea, vomiting, abdominal pain, diarrhea and blood in stool.  Genitourinary: Positive for dysuria. Negative for difficulty urinating.  Musculoskeletal: Negative for back pain and neck pain.  Skin: Negative for rash.  Neurological: Positive for syncope. Negative for dizziness, facial asymmetry, speech difficulty (initially slow to answer questions to family and ems), weakness, light-headedness, numbness and headaches. Seizures: possible.      Allergies  Percocet and Vicodin  Home Medications   Prior to Admission medications   Medication Sig Start Date End Date Taking? Authorizing Provider  amitriptyline (ELAVIL) 50 MG tablet Take 50 mg by mouth at bedtime. 05/08/15  Yes Historical Provider, MD  amLODipine-olmesartan (AZOR) 5-40 MG per tablet Take 1 tablet by mouth daily.   Yes Historical Provider, MD  aspirin-acetaminophen-caffeine (EXCEDRIN MIGRAINE) [phone] MG per tablet Take 2 tablets by mouth every 6 (six) hours as needed for headache.   Yes Historical Provider, MD  atorvastatin (LIPITOR) 40 MG tablet Take 40 mg by mouth daily. 05/10/15  Yes Historical Provider, MD  gabapentin (NEURONTIN) 600 MG tablet Take 600 mg by mouth 2 (two) times daily.  10/08/14  Yes Historical Provider, MD  glipiZIDE (GLUCOTROL) 5 MG tablet Take 0.5 tablets (2.5 mg total) by mouth daily before breakfast. 06/07/15  Yes Reyne Dumas, MD  hydrochlorothiazide (HYDRODIURIL) 25 MG tablet Take 1 tablet (25 mg total) by mouth daily. 05/22/15  Yes Hannah Muthersbaugh, PA-C  levETIRAcetam (KEPPRA) 500 MG tablet Take 1 tablet (500 mg total) by mouth 2 (two) times daily. 07/04/15  Yes Marcial Pacas, MD  metFORMIN (GLUCOPHAGE) 500 MG tablet Take 500 mg by mouth 2 (two) times daily. 10/09/14  Yes Historical  Provider, MD  metoprolol succinate (TOPROL-XL) 25 MG 24 hr tablet Take 50 mg by mouth at bedtime. 05/08/15  Yes Historical Provider, MD  NOVOLOG FLEXPEN 100 UNIT/ML FlexPen Inject 20-50 Units into the skin 2 (two) times daily. 50 units in am and 20 units in pm 10/08/14  Yes Historical Provider, MD  ranitidine (ZANTAC) 150 MG tablet Take 1 tablet (150 mg total) by mouth 2 (two) times daily. 10/18/14  Yes Blanchie Dessert, MD  sucralfate (CARAFATE) 1 G tablet Take 1 tablet (1 g total) by mouth 4 (four) times daily -  with meals and at bedtime. 10/18/14  Yes Blanchie Dessert, MD  cephALEXin (KEFLEX) 500 MG capsule Take 1 capsule (500 mg total) by mouth 4 (four) times daily. 09/18/15 09/25/15  Gareth Morgan, MD   BP 142/102 mmHg  Pulse 81  Temp(Src) 98.3 F (36.8 C)  Resp 12  SpO2 100% Physical Exam  Constitutional: She is oriented to person, place, and time. She appears well-developed and well-nourished. No distress.  Initially patient slow to answer on arrival with EMS, however oriented at time of my evaluation   HENT:  Head: Normocephalic and atraumatic.  Eyes: Conjunctivae and EOM are normal.  Neck: Normal range of motion.  Cardiovascular: Normal rate, regular rhythm, normal heart sounds and intact distal pulses.  Exam reveals no gallop and no friction rub.   No murmur heard. Pulmonary/Chest: Effort normal and breath sounds normal. No respiratory distress. She has no wheezes. She has no rales.  Abdominal: Soft. She exhibits no distension. There is no tenderness. There is no guarding.  Musculoskeletal: She exhibits no edema or tenderness.  Neurological: She is alert and oriented to person, place, and time. She has normal strength. She displays no tremor. No cranial nerve deficit or sensory deficit. She exhibits normal muscle tone. GCS eye subscore is 4. GCS verbal subscore is 5. GCS motor subscore is 6.  Skin: Skin is warm and dry. No rash noted. She is not diaphoretic. No erythema.  Nursing  note and vitals reviewed.   ED Course  Procedures (including critical care time) Labs Review Labs Reviewed  CBC WITH DIFFERENTIAL/PLATELET - Abnormal; Notable for the following:    RBC 3.38 (*)    Hemoglobin 10.0 (*)    HCT 29.0 (*)    All other components within normal limits  COMPREHENSIVE METABOLIC PANEL - Abnormal; Notable for the following:    Glucose, Bld 311 (*)    ALT 13 (*)    Alkaline Phosphatase 147 (*)    All other components within normal limits  URINALYSIS, ROUTINE W REFLEX MICROSCOPIC (NOT AT [facility]) - Abnormal; Notable for the following:    APPearance HAZY (*)    Glucose, UA >1000 (*)    Nitrite POSITIVE (*)    Leukocytes, UA LARGE (*)    All other components within normal limits  URINE MICROSCOPIC-ADD ON - Abnormal; Notable for the following:    Bacteria, UA MANY (*)    Casts HYALINE CASTS (*)    All other components within normal limits  MAGNESIUM  I-STAT TROPOININ, ED  I-STAT TROPOININ,  ED    Imaging Review Dg Chest 2 View  09/18/2015  CLINICAL DATA:  Syncope, altered mental status, personal history of hypertension, diabetes mellitus, smoking EXAM: CHEST  2 VIEW COMPARISON:  06/02/2015 FINDINGS: Normal heart size, mediastinal contours, and pulmonary vascularity. Minimal central peribronchial thickening and pulmonary hyperinflation. No pulmonary infiltrate, pleural effusion, or pneumothorax. Bones demineralized. IMPRESSION: Minimal bronchitic changes and hyperinflation which could be related to asthma or COPD. No acute infiltrate. Electronically Signed   By: Lavonia Dana M.D.   On: 09/18/2015 13:13   Ct Head Wo Contrast  09/18/2015  CLINICAL DATA:  59 year old female with syncopal episode. Upon waking altered mental status. Diabetic hypertensive hyperlipidemic patient. Initial encounter. EXAM: CT HEAD WITHOUT CONTRAST TECHNIQUE: Contiguous axial images were obtained from the base of the skull through the vertex without intravenous contrast. COMPARISON:  06/06/2015  brain MR. 05/22/2015 head CT. FINDINGS: No skull fracture or intracranial hemorrhage. No CT evidence of large acute infarct. No hydrocephalus. Nonspecific left occipital -parietal subcutaneous lesion appears long-standing with remodeling of bone. Appearance unchanged. No other findings of intracranial mass lesion noted on this unenhanced exam. Post lens replacement. Otherwise visualized orbital structures unremarkable. Mucosal thickening/partial opacification ethmoid sinus air cells. Mild mucosal thickening right frontal sinus. IMPRESSION: No skull fracture or intracranial hemorrhage. No CT evidence of large acute infarct. Nonspecific 2.5 cm left occipital -parietal subcutaneous lesion appears long-standing with remodeling of bone. Appearance unchanged. Mucosal thickening/partial opacification ethmoid sinus air cells. Mild mucosal thickening right frontal sinus. Electronically Signed   By: Genia Del M.D.   On: 09/18/2015 13:06   I have personally reviewed and evaluated these images and lab results as part of my medical decision-making.   EKG Interpretation   Date/Time:  Tuesday September 18 2015 11:51:35 EDT Ventricular Rate:  90 PR Interval:  169 QRS Duration: 99 QT Interval:  394 QTC Calculation: 482 R Axis:   15 Text Interpretation:  Sinus rhythm Low voltage, extremity leads Abnormal  R-wave progression, early transition Interpretation limited secondary to  artifact No significant change since last tracing Confirmed by [facility]  MD, Arti Trang ([id number]) on 09/18/2015 12:10:27 PM      MDM   Final diagnoses:  Syncope, unspecified syncope type  UTI (lower urinary tract infection)   59 year old female with a history of diabetes, hypertension, seizures, mood disorder presents with concern for syncopal episode. Differential diagnosis for altered mental status and syncopal episodes includes syncope from cardiogenic etiology, seizures, infection, anemia, situational. Patient had altered mental  status, appearing to be postictal with EMS on on initial room arrival to the emergency department, and given concern for possible fall with head injury and altered mental status a head CT was obtained which showed no acute intracranial abnormality.  Chest x-ray was obtained to evaluate for infection as a cause of altered mental status and showed no acute infiltrate. Patient not taking her blood pressure medications today, and was given medications and Keppra 1 g.   No sign ACS. Patient returned to baseline following episodes and has no sign of sepsis on labs. CMP WNL.  Urinalysis concerning for urinary tract infection and patient reports some hx of dysuria but denies flank pain, abd pain, n/v and will treat with keflex for 7 days.    After discussion with patient's daughter, who witnessed the episode, history is consistent with multiple prior episodes it associated with stress.  Daughter reports that patient became emotional, was trembling, then got up to stand and walk and syncopized for a few seconds  prior to opening her eyes and not responding. She reports she seen the patient do this multiple times also in settings of emotional stress. No seizure like activity. Recommend close outpatient neurology follow up as is scheduled for patient and keflex for UTI.           Gareth Morgan, MD 09/18/15 2136

## 2015-09-19 DIAGNOSIS — N182 Chronic kidney disease, stage 2 (mild): Secondary | ICD-10-CM | POA: Diagnosis not present

## 2015-09-19 DIAGNOSIS — E1121 Type 2 diabetes mellitus with diabetic nephropathy: Secondary | ICD-10-CM | POA: Diagnosis not present

## 2015-09-19 DIAGNOSIS — E1122 Type 2 diabetes mellitus with diabetic chronic kidney disease: Secondary | ICD-10-CM | POA: Diagnosis not present

## 2015-09-19 DIAGNOSIS — F324 Major depressive disorder, single episode, in partial remission: Secondary | ICD-10-CM | POA: Diagnosis not present

## 2015-09-19 DIAGNOSIS — E1142 Type 2 diabetes mellitus with diabetic polyneuropathy: Secondary | ICD-10-CM | POA: Diagnosis not present

## 2015-09-19 DIAGNOSIS — I129 Hypertensive chronic kidney disease with stage 1 through stage 4 chronic kidney disease, or unspecified chronic kidney disease: Secondary | ICD-10-CM | POA: Diagnosis not present

## 2015-10-02 DIAGNOSIS — E1121 Type 2 diabetes mellitus with diabetic nephropathy: Secondary | ICD-10-CM | POA: Diagnosis not present

## 2015-10-02 DIAGNOSIS — E1122 Type 2 diabetes mellitus with diabetic chronic kidney disease: Secondary | ICD-10-CM | POA: Diagnosis not present

## 2015-10-02 DIAGNOSIS — N182 Chronic kidney disease, stage 2 (mild): Secondary | ICD-10-CM | POA: Diagnosis not present

## 2015-10-02 DIAGNOSIS — F324 Major depressive disorder, single episode, in partial remission: Secondary | ICD-10-CM | POA: Diagnosis not present

## 2015-10-02 DIAGNOSIS — I129 Hypertensive chronic kidney disease with stage 1 through stage 4 chronic kidney disease, or unspecified chronic kidney disease: Secondary | ICD-10-CM | POA: Diagnosis not present

## 2015-10-02 DIAGNOSIS — E1142 Type 2 diabetes mellitus with diabetic polyneuropathy: Secondary | ICD-10-CM | POA: Diagnosis not present

## 2015-10-03 DIAGNOSIS — E1142 Type 2 diabetes mellitus with diabetic polyneuropathy: Secondary | ICD-10-CM | POA: Diagnosis not present

## 2015-10-03 DIAGNOSIS — E1122 Type 2 diabetes mellitus with diabetic chronic kidney disease: Secondary | ICD-10-CM | POA: Diagnosis not present

## 2015-10-03 DIAGNOSIS — I129 Hypertensive chronic kidney disease with stage 1 through stage 4 chronic kidney disease, or unspecified chronic kidney disease: Secondary | ICD-10-CM | POA: Diagnosis not present

## 2015-10-03 DIAGNOSIS — G40909 Epilepsy, unspecified, not intractable, without status epilepticus: Secondary | ICD-10-CM | POA: Diagnosis not present

## 2015-10-03 DIAGNOSIS — N182 Chronic kidney disease, stage 2 (mild): Secondary | ICD-10-CM | POA: Diagnosis not present

## 2015-10-03 DIAGNOSIS — E785 Hyperlipidemia, unspecified: Secondary | ICD-10-CM | POA: Diagnosis not present

## 2015-10-04 ENCOUNTER — Encounter: Payer: Self-pay | Admitting: Neurology

## 2015-10-04 ENCOUNTER — Ambulatory Visit (INDEPENDENT_AMBULATORY_CARE_PROVIDER_SITE_OTHER): Payer: Medicare Other | Admitting: Neurology

## 2015-10-04 VITALS — BP 128/70 | HR 82 | Resp 14 | Ht 67.0 in | Wt 138.0 lb

## 2015-10-04 DIAGNOSIS — R269 Unspecified abnormalities of gait and mobility: Secondary | ICD-10-CM | POA: Diagnosis not present

## 2015-10-04 DIAGNOSIS — R413 Other amnesia: Secondary | ICD-10-CM | POA: Diagnosis not present

## 2015-10-04 DIAGNOSIS — R531 Weakness: Secondary | ICD-10-CM | POA: Diagnosis not present

## 2015-10-04 DIAGNOSIS — F39 Unspecified mood [affective] disorder: Secondary | ICD-10-CM

## 2015-10-04 DIAGNOSIS — E1142 Type 2 diabetes mellitus with diabetic polyneuropathy: Secondary | ICD-10-CM | POA: Diagnosis not present

## 2015-10-04 MED ORDER — LEVETIRACETAM 500 MG PO TABS: ORAL_TABLET | ORAL | Status: DC

## 2015-10-04 NOTE — Progress Notes (Signed)
PATIENT: Tricia Huynh DOB: 12-30-1955  Chief Complaint  Patient presents with  . Seizures    Tricia Huynh is here for f/u of sz. d/o and memory loss.  Sts. last sz .was 10 days ago.  Denies missed doses of Keppra or Gabapentin.  Sts. memory is about the same/fim  . Memory Loss     HISTORICAL  Tricia Huynh is a 59 years old right-handed female, seen in refer by  her primary care physician Dr. Audley Hose for evaluation of memory trouble, seizure, she took scat transportation to office, alone at today's clinical visit in July 04 2015.  She had a history of insulin-dependent diabetes, hypertension, hyperlipidemia, lives with her granddaughters, she has been on disability due to her depression since 1998, had 10 years education, used to work at [employer],  She was admitted to the hospital from July 20 to 21 2016, was found down at home, confused, with bowel and bladder incontinence, she was seen by neuro hospitalists, abnormal EEG in July 2016,focal slowing over the occipital regions, left greater than right, Posterior-dominant photoepileptiform response, asymmetric more on the left occipital region, she was started on Keppra 500 mg twice a day, no recurrent seizure event  I have reviewed MRI of the brain without contrast of July 2016:1. No acute intracranial infarct or other abnormality identified. Mild age-related cerebral atrophy. 2.5 cm well-circumscribed ovoid lesion within the left occipital scalp. This lesion is indeterminate, but most certainly benign.  Laboratory evaluation, creatinine 1. 3, low TSH 0.2 CBC showed mild anemia hemoglobin 11 point 5, negative ammonia, UDS, A1c 11. 8,   UPDATE Oct 04 2015: She is with her granddaughter Tricia Huynh, She has one seizure in Oct 02 2015, she was not able to elaborate on details. She lives with her granddaughter, she has gait difficulty.  She denies significant pain.  She has no bladder incontinence, she has mild constipation. 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  Her migraines has much improved   REVIEW OF SYSTEMS: Full 14 system review of systems performed and notable only for as above  ALLERGIES: Allergies  Allergen Reactions  . Percocet [Oxycodone-Acetaminophen] Nausea And Vomiting and Other (See Comments)    Shaky/unsteady.  . Vicodin [Hydrocodone-Acetaminophen] Nausea And Vomiting and Other (See Comments)    Shaky/unsteady.    HOME MEDICATIONS: Current Outpatient Prescriptions  Medication Sig Dispense Refill  . amitriptyline (ELAVIL) 50 MG tablet Take 50 mg by mouth at bedtime.  1  . amLODipine-olmesartan (AZOR) 5-40 MG per tablet Take 1 tablet by mouth daily.    Marland Kitchen atorvastatin (LIPITOR) 40 MG tablet Take 40 mg by mouth daily.  1  . BD PEN NEEDLE NANO U/F 32G X 4 MM MISC 2 (two) times daily. as directed  1  . gabapentin (NEURONTIN) 600 MG tablet Take 600 mg by mouth 2 (two) times daily.   0  . glipiZIDE (GLUCOTROL) 5 MG tablet Take 0.5 tablets (2.5 mg total) by mouth daily before breakfast. 30 tablet 2  . hydrochlorothiazide (HYDRODIURIL) 25 MG tablet Take 1 tablet (25 mg total) by mouth daily. 30 tablet 0  . levETIRAcetam (KEPPRA) 500 MG tablet Take 1 tablet (500 mg total) by mouth 2 (two) times daily. [place]  tablet 11  . metFORMIN (GLUCOPHAGE) 500 MG tablet Take 500 mg by mouth 2 (two) times daily.  0  . metoprolol succinate (TOPROL-XL) 25 MG 24 hr tablet Take 50 mg by mouth at bedtime.  1  . NOVOLOG FLEXPEN 100 UNIT/ML FlexPen Inject 20-50 Units into the skin 2 (two) times daily. 50 units in am and 20 units in pm  0  . sucralfate (CARAFATE) 1 G tablet Take 1 tablet (1 g total) by mouth 4 (four) times daily -  with meals and at bedtime. 30 tablet 0  .  aspirin-acetaminophen-caffeine (EXCEDRIN MIGRAINE) [id number] MG per tablet Take 2 tablets by mouth every 6 (six) hours as needed for headache.    . ranitidine (ZANTAC) 150 MG tablet Take 1 tablet (150 mg total) by mouth 2 (two) times daily. (Patient not taking: Reported on 10/04/2015) 28 tablet 0   No current facility-administered medications for this visit.    PAST MEDICAL HISTORY: Past Medical History  Diagnosis Date  . Diabetes mellitus without complication ([place])   . Hypertension   . Mood disorder ([place]) 06/06/2015  . Essential hypertension 06/06/2015  . Dyslipidemia 06/06/2015  . Diabetic neuropathy ([place]) 06/06/2015  . Diabetes mellitus type 2 in nonobese ([place]) 06/06/2015  . Encephalopathy     PAST SURGICAL HISTORY: Past Surgical History  Procedure Laterality Date  . Abdominal hysterectomy    . Tubal ligation    . Eye surgery Bilateral     FAMILY HISTORY: Family History  Problem Relation Age of Onset  . Heart disease Father   . Kidney disease Father   . Diabetes Mother     SOCIAL HISTORY:  Social History   Social History  . Marital Status: Single    Spouse Name: N/A  . Number of Children: 3  . Years of Education: 10   Occupational History  . Disabled    Social History Main Topics  . Smoking status: Former Research scientist (life sciences)  . Smokeless tobacco: Never Used  . Alcohol Use: No  . Drug Use: No  . Sexual Activity: Yes   Other Topics Concern  . Not on file   Social History Narrative   Lives at home with her grandchildren.   Right-handed.   No caffeine use.    PHYSICAL EXAM   Filed Vitals:   10/04/15 0944  BP: 128/70  Pulse: 82  Resp: 14  Height: 5\' 7"  (1.702 m)  Weight: 138 lb (62.596 kg)    Not recorded      Body mass index is 21.61 kg/(m^2).  PHYSICAL EXAMNIATION:  Gen: NAD, conversant, well nourised, obese, well groomed                     Cardiovascular: Regular rate rhythm, no peripheral edema, warm, nontender. Eyes: Conjunctivae clear without  exudates or hemorrhage Neck: Supple, no carotid bruise. Pulmonary: Clear to auscultation bilaterally   NEUROLOGICAL EXAM:  MENTAL STATUS: Speech:    Speech is normal; fluent and spontaneous with normal comprehension.  Cognition:Mini-Mental Status Examination is 23 out of 30, animal naming 12  She is not oriented to [place],  Recent and remote memory missed one out of 3 recalls  Attention span and concentration, has difficulties low world backwards  Normal Language, naming, repeating,spontaneous speech  Has difficulty copy figure   CRANIAL NERVES: CN II: Visual fields are full to confrontation. Fundoscopic exam is normal with sharp discs and no vascular changes. Pupils are round equal and briskly reactive to light. CN III, IV,  VI: extraocular movement are normal. No ptosis. CN V: Facial sensation is intact to pinprick in all 3 divisions bilaterally. Corneal responses are intact.  CN VII: Face is symmetric with normal eye closure and smile. CN VIII: Hearing is normal to rubbing fingers CN IX, X: Palate elevates symmetrically. Phonation is normal. CN XI: Head turning and shoulder shrug are intact CN XII: Tongue is midline with normal movements and no atrophy.  MOTOR: Normal tone and bulk, she has mild bilateral hip flexion, ankle dorsiflexion weakness,  REFLEXES: Areflexia  Sensory: She has length dependent decreased light touch pinprick vibratory sensation.   COORDINATION: Rapid alternating movements and fine finger movements are intact. There is no dysmetria on finger-to-nose and heel-knee-shin.    GAIT/STANCE: She need to push up to get up from seated position, cautious unsteady, could not stand up on tiptoe heels.    DIAGNOSTIC DATA (LABS, IMAGING, TESTING) - I reviewed patient records, labs, notes, testing and imaging myself where available.  ASSESSMENT AND PLAN  Tricia Huynh is a 59 y.o. female    Seizure  recurrent seizure on October 02 2015   Increase her  Keppra to 500 mg in the morning/1000 mg at night your prescription was written    memory loss   Mini-Mental Status Examination 20 out of 30 today  Central nervous system degenerative disorder, her mood disorder likely play a role as well  Gait difficulty, falling episode   variable effort from patient, differentiation diagnosis including lumbar radiculopathy, diabetic peripheral neuropathy  MRI of lumbar  EMG nerve conduction study Diabetic peripheral neuropathy:  Her A1c was 11 point 7 in July 2016  laboratory evaluations   Tricia Huynh, M.D. Ph.D.  [facility] [address] [place], [place] [address] Ph: [phone] Fax: [phone]  CC: Dr. Audley Hose

## 2015-10-05 LAB — THYROID PANEL WITH TSH
Free Thyroxine Index: 1.7 (ref 1.2–4.9)
T3 Uptake Ratio: 32 % (ref 24–39)
T4, Total: 5.3 ug/dL (ref 4.5–12.0)
TSH: 0.917 u[IU]/mL (ref 0.450–4.500)

## 2015-10-05 LAB — HEMOGLOBIN A1C
Est. average glucose Bld gHb Est-mCnc: 243 mg/dL
Hgb A1c MFr Bld: 10.1 % — ABNORMAL HIGH (ref 4.8–5.6)

## 2015-10-05 LAB — CK: Total CK: 63 U/L (ref 24–173)

## 2015-10-05 LAB — ANA W/REFLEX IF POSITIVE: Anti Nuclear Antibody(ANA): NEGATIVE

## 2015-10-08 ENCOUNTER — Ambulatory Visit: Payer: Medicare Other | Attending: Internal Medicine | Admitting: Physical Therapy

## 2015-10-08 ENCOUNTER — Encounter: Payer: Self-pay | Admitting: Physical Therapy

## 2015-10-08 ENCOUNTER — Telehealth: Payer: Self-pay | Admitting: Neurology

## 2015-10-08 DIAGNOSIS — R29898 Other symptoms and signs involving the musculoskeletal system: Secondary | ICD-10-CM | POA: Diagnosis not present

## 2015-10-08 DIAGNOSIS — R269 Unspecified abnormalities of gait and mobility: Secondary | ICD-10-CM | POA: Diagnosis not present

## 2015-10-08 NOTE — Telephone Encounter (Signed)
Patient aware of results and will call her PCP to schedule an appt.

## 2015-10-08 NOTE — Telephone Encounter (Signed)
Laboratory evaluation showed A1c 10 point 1 she should contact her primary care for better diabetic control

## 2015-10-08 NOTE — Therapy (Signed)
[facility] [address] [place], [place], [address] Phone: [phone]   Fax:  [phone]  Physical Therapy Evaluation  Patient Details  Name: Tricia Huynh MRN: [id number] Date of Birth: [birth year] Referring Provider: Reyne Dumas, MD  Encounter Date: 10/08/2015      PT End of Session - 10/08/15 2102    Visit Number 1   Number of Visits 9   Date for PT Re-Evaluation 11/07/15   Authorization Type Medicare   Authorization Time Period 10-08-15  12-07-15   PT Start Time 1100   PT Stop Time 1145   PT Time Calculation (min) 45 min      Past Medical History  Diagnosis Date  . Diabetes mellitus without complication ([place])   . Hypertension   . Mood disorder ([place]) 06/06/2015  . Essential hypertension 06/06/2015  . Dyslipidemia 06/06/2015  . Diabetic neuropathy ([place]) 06/06/2015  . Diabetes mellitus type 2 in nonobese ([place]) 06/06/2015  . Encephalopathy     Past Surgical History  Procedure Laterality Date  . Abdominal hysterectomy    . Tubal ligation    . Eye surgery Bilateral     There were no vitals filed for this visit.  Visit Diagnosis:  Abnormality of gait - Plan: PT plan of care cert/re-cert  Leg weakness, bilateral - Plan: PT plan of care cert/re-cert      Subjective Assessment - 10/08/15 2049    Subjective Pt. went to [facility] ED on 06-06-15 due to being found unconscious due to having had a seizure; pt was admitted and discharged on 06-07-15; pt had another seizure on 09-18-15 - went to ED again and was discharged same day (acute encephalopathy); pt reports difficulty with walking and reports LE weakness                                                                    y   Patient Stated Goals Improve balance and walking; increase LE strength   Currently in Pain? No/denies            [facility] PT Assessment - 10/08/15 1118    Assessment   Medical Diagnosis Acute ecephalopathy; s/p seizures; Gait  abnormality   Referring Provider Reyne Dumas, MD   Onset Date/Surgical Date 06/06/15   Precautions   Precautions Fall   Balance Screen   Has the patient fallen in the past 6 months No   Has the patient had a decrease in activity level because of a fear of falling?  Yes   Is the patient reluctant to leave their home because of a fear of falling?  No   Home Environment   Living Environment Private residence   Type of [place] to enter   Entrance Stairs-Number of Steps 3   Entrance Stairs-Rails Left   Home Layout One level   Prior Function   Level of Independence Independent with basic ADLs;Independent with household mobility with device  has a cane that she uses occasionally   Leisure watches TV   Sensation   Light Touch Impaired by gross assessment  numbness reported in bil. toes   Strength   Overall Strength Deficits   Overall Strength Comments grossly 3+ - 4-/5 for bil. hip  musc. ; bil. knee musc. grossly 4/5 and bil. ankle musc. 4/5   Ambulation/Gait   Ambulation/Gait Yes   Ambulation Distance (Feet) 100 Feet   Assistive device None   Gait Pattern Decreased step length - right;Decreased step length - left;Decreased weight shift to left;Decreased stance time - right   Ambulation Surface Level;Indoor   Standardized Balance Assessment   Standardized Balance Assessment Timed Up and Go Test   Berg Balance Test   Sit to Stand Able to stand  independently using hands   Standing Unsupported Needs several tries to stand 30 seconds unsupported   Sitting with Back Unsupported but Feet Supported on Floor or Stool Able to sit safely and securely 2 minutes   Stand to Sit Controls descent by using hands   Transfers Able to transfer safely, definite need of hands   Standing Unsupported with Eyes Closed Able to stand 10 seconds with supervision   Standing Ubsupported with Feet Together Able to place feet together independently and stand for 1 minute with supervision    From Standing, Reach Forward with Outstretched Arm Can reach forward >5 cm safely (2")   From Standing Position, Pick up Object from Floor Unable to try/needs assist to keep balance   From Standing Position, Turn to Look Behind Over each Shoulder Needs supervision when turning   Turn 360 Degrees Needs close supervision or verbal cueing   Standing Unsupported, Alternately Place Feet on Step/Stool Able to complete >2 steps/needs minimal assist   Standing Unsupported, One Foot in Front Able to take small step independently and hold 30 seconds   Standing on One Leg Unable to try or needs assist to prevent fall   Total Score 27   Timed Up and Go Test   Normal TUG (seconds) 12.97                                PT Long Term Goals - 10/08/15 2109    PT LONG TERM GOAL #1   Title Pt will incr. Berg balance test score to >/= 33/56 to reduce fall risk. (11-07-15)   Baseline 27/56   Time 4   Period Weeks   Status New   PT LONG TERM GOAL #2   Title Amb. 300' with RW for incr. community accessibility. (11-07-15)   Baseline no device used - gait is unsafe placing pt at high fall risk   Time 4   Period Weeks   Status New   PT LONG TERM GOAL #3   Title Increase bil. LE strength so pt able to perform sit to stand from mat without UE support with SBA.  (11-07-15)   Time 4   Period Weeks   Status New   PT LONG TERM GOAL #4   Title Independent in HEP for balance and strengthening exs.  (11-07-15)   Time 4   Period Weeks   Status New               Plan - 10/08/15 2104    Clinical Impression Statement Pt presents with R knee instability noted during static standing - would benefit from use of RW for assistance with gait to reduce fall risk;   Pt will benefit from skilled therapeutic intervention in order to improve on the following deficits Abnormal gait;Decreased balance;Decreased endurance;Decreased cognition;Decreased mobility;Decreased strength   Rehab  Potential Good   PT Frequency 2x / week   PT Duration 4 weeks  PT Treatment/Interventions ADLs/Self Care Home Management;DME Instruction;Gait training;Stair training;Functional mobility training;Therapeutic activities;Therapeutic exercise;Balance training;Neuromuscular re-education;Patient/family education   PT Next Visit Plan Balance HEP; bil. LE strengthening;  assess gait velocity - RW trial if pt agrees   PT Home Exercise Plan Balance   Consulted and Agree with Plan of Care Patient          G-Codes - 2015/10/28 02/28/2115    Functional Assessment Tool Used Berg score 27/56;  TUG score 12.97 secs; pt is not using a device but is very unsteady and at risk for fall   Functional Limitation Mobility: Walking and moving around   Mobility: Walking and Moving Around Current Status [phone]) At least 40 percent but less than 60 percent impaired, limited or restricted   Mobility: Walking and Moving Around Goal Status [phone]) At least 20 percent but less than 40 percent impaired, limited or restricted       Problem List Patient Active Problem List   Diagnosis Date Noted  . Abnormality of gait 10/04/2015  . Weakness 10/04/2015  . Memory loss 10/04/2015  . Acute encephalopathy 06/06/2015  . Mood disorder ([place]) 06/06/2015  . Diabetes mellitus type 2 in nonobese ([place]) 06/06/2015  . Diabetic neuropathy ([place]) 06/06/2015  . Dyslipidemia 06/06/2015  . Essential hypertension 06/06/2015    Alda Lea, PT October 28, 2015, 9:21 PM  [facility] [address] [place], [place], [address] Phone: [phone]   Fax:  [phone]  Name: Tricia Huynh MRN: [id number] Date of Birth: [birth year]

## 2015-10-16 ENCOUNTER — Ambulatory Visit: Payer: Medicare Other | Admitting: Physical Therapy

## 2015-10-17 DIAGNOSIS — E785 Hyperlipidemia, unspecified: Secondary | ICD-10-CM | POA: Diagnosis not present

## 2015-10-17 DIAGNOSIS — N182 Chronic kidney disease, stage 2 (mild): Secondary | ICD-10-CM | POA: Diagnosis not present

## 2015-10-17 DIAGNOSIS — E1142 Type 2 diabetes mellitus with diabetic polyneuropathy: Secondary | ICD-10-CM | POA: Diagnosis not present

## 2015-10-17 DIAGNOSIS — G40909 Epilepsy, unspecified, not intractable, without status epilepticus: Secondary | ICD-10-CM | POA: Diagnosis not present

## 2015-10-17 DIAGNOSIS — I129 Hypertensive chronic kidney disease with stage 1 through stage 4 chronic kidney disease, or unspecified chronic kidney disease: Secondary | ICD-10-CM | POA: Diagnosis not present

## 2015-10-17 DIAGNOSIS — E1122 Type 2 diabetes mellitus with diabetic chronic kidney disease: Secondary | ICD-10-CM | POA: Diagnosis not present

## 2015-10-18 ENCOUNTER — Ambulatory Visit: Payer: Medicare Other | Attending: Internal Medicine | Admitting: Physical Therapy

## 2015-10-18 VITALS — BP 146/96 | HR 113

## 2015-10-18 DIAGNOSIS — R269 Unspecified abnormalities of gait and mobility: Secondary | ICD-10-CM | POA: Diagnosis not present

## 2015-10-18 DIAGNOSIS — R29898 Other symptoms and signs involving the musculoskeletal system: Secondary | ICD-10-CM | POA: Diagnosis not present

## 2015-10-18 NOTE — Patient Instructions (Signed)
EXTENSION: Sitting - Resistance Band (Active)    Sit with feet flat. Against yellow resistance band, straighten right knee. Complete 10 repetitions. Perform 2 sessions per day.  Copyright  VHI. All rights reserved.   FLEXION: Sitting (Active)    Sit, both feet flat. Lift right knee toward ceiling.  Complete 10 repetitions. Perform 2 sessions per day.  Copyright  VHI. All rights reserved.   Functional Quadriceps: Sit to Stand    Sit on edge of chair, feet flat on floor. Stand upright, extending knees fully. Repeat 10 times.  Do 2 sessions per day.  http://orth.exer.us/735   Copyright  VHI. All rights reserved.   ANKLE: Dorsiflexion (Band)    Sit at edge of surface. Place band around top of foot. Keeping heel on floor, raise toes of banded foot.  Use yellow band. Repeat 10 times.  Do 2 times a day.  Copyright  VHI. All rights reserved.

## 2015-10-18 NOTE — Therapy (Signed)
[facility] [address] [place], [place], [address] Phone: [phone]   Fax:  [phone]  Physical Therapy Treatment  Patient Details  Name: Tricia Huynh MRN: [id number] Date of Birth: [birth year] Referring Provider: Reyne Dumas, MD  Encounter Date: 10/18/2015      PT End of Session - 10/18/15 2117    Visit Number 2   Number of Visits 9   Date for PT Re-Evaluation 11/07/15   Authorization Type Medicare   Authorization Time Period 10-08-15  12-07-15   PT Start Time 1407   PT Stop Time 1449   PT Time Calculation (min) 42 min   Equipment Utilized During Treatment Gait belt   Activity Tolerance Patient tolerated treatment well   Behavior During Therapy [facility] for tasks assessed/performed      Past Medical History  Diagnosis Date  . Diabetes mellitus without complication ([place])   . Hypertension   . Mood disorder ([place]) 06/06/2015  . Essential hypertension 06/06/2015  . Dyslipidemia 06/06/2015  . Diabetic neuropathy ([place]) 06/06/2015  . Diabetes mellitus type 2 in nonobese ([place]) 06/06/2015  . Encephalopathy     Past Surgical History  Procedure Laterality Date  . Abdominal hysterectomy    . Tubal ligation    . Eye surgery Bilateral     Filed Vitals:   10/18/15 1415 10/18/15 1423  BP: 135/95 146/96  Pulse: 111 113    Visit Diagnosis:  Abnormality of gait  Leg weakness, bilateral      Subjective Assessment - 10/18/15 1415    Subjective Denies falls or changes since last viist.  Is still having episodes of LE's giving way but either catches herself or family catches her.   Patient Stated Goals Improve balance and walking; increase LE strength   Currently in Pain? Yes   Pain Score 7    Pain Location Knee   Pain Orientation Right;Left   Pain Descriptors / Indicators Aching   Pain Type Chronic pain   Pain Onset More than a month ago   Pain Frequency Intermittent   Aggravating Factors  unsure   Pain  Relieving Factors meds but make her sleepy   Multiple Pain Sites No             [facility] Adult PT Treatment/Exercise - 10/18/15 2111    Transfers   Transfers Sit to Stand;Stand to Sit   Sit to Stand 4: Min guard   Stand to Sit 5: Supervision   Number of Reps 10 reps   Comments cues for technique and to control descent   Ambulation/Gait   Ambulation/Gait Yes   Ambulation/Gait Assistance 4: Min guard   Ambulation Distance (Feet) 220 Feet  x 2 and 100 x 2   Assistive device Rolling walker;None   Gait Pattern Decreased step length - right;Decreased step length - left;Decreased weight shift to left;Decreased stance time - right   Ambulation Surface Level;Indoor   Gait Comments trialed RW with improved stability and balance;without RW, pt reaching out for walls and furniture and bumping into walls as well.  Pt states she feels steadier with RW.  Pt did have an episode of LE's "buckling" with fatigue but was able to maintain balance with RW.      Pt performed exercises as written in patient instructions and provided as HEP.  See patient instructions for details.  Pt needed several rest breaks during session due to fatigue and LE weakness.          PT Education -  10/18/15 2115    Education provided Yes   Education Details HEP, process to obtain order for RW from MD, increased safety with RW   Person(s) Educated Patient   Methods Explanation;Demonstration;Handout   Comprehension Verbalized understanding;Returned demonstration             PT Long Term Goals - 10/08/15 2109    PT LONG TERM GOAL #1   Title Pt will incr. Berg balance test score to >/= 33/56 to reduce fall risk. (11-07-15)   Baseline 27/56   Time 4   Period Weeks   Status New   PT LONG TERM GOAL #2   Title Amb. 300' with RW for incr. community accessibility. (11-07-15)   Baseline no device used - gait is unsafe placing pt at high fall risk   Time 4   Period Weeks   Status New   PT LONG TERM GOAL #3    Title Increase bil. LE strength so pt able to perform sit to stand from mat without UE support with SBA.  (11-07-15)   Time 4   Period Weeks   Status New   PT LONG TERM GOAL #4   Title Independent in HEP for balance and strengthening exs.  (11-07-15)   Time 4   Period Weeks   Status New               Plan - 10/18/15 2117    Clinical Impression Statement Pt agreeable to RW use and obtaining order from MD.  Pt with episodes of bil LE instability and knees buckling with fatigue but able to maintain balance with RW.  Continue PT per POC.   Pt will benefit from skilled therapeutic intervention in order to improve on the following deficits Abnormal gait;Decreased balance;Decreased endurance;Decreased cognition;Decreased mobility;Decreased strength   Rehab Potential Good   PT Frequency 2x / week   PT Duration 4 weeks   PT Treatment/Interventions ADLs/Self Care Home Management;DME Instruction;Gait training;Stair training;Functional mobility training;Therapeutic activities;Therapeutic exercise;Balance training;Neuromuscular re-education;Patient/family education   PT Next Visit Plan Review HEP and revise/add to as needed.  assess gait velocity. follow up on MD order for RW   PT Home Exercise Plan strengthening   Consulted and Agree with Plan of Care Patient        Problem List Patient Active Problem List   Diagnosis Date Noted  . Abnormality of gait 10/04/2015  . Weakness 10/04/2015  . Memory loss 10/04/2015  . Acute encephalopathy 06/06/2015  . Mood disorder ([place]) 06/06/2015  . Diabetes mellitus type 2 in nonobese ([place]) 06/06/2015  . Diabetic neuropathy ([place]) 06/06/2015  . Dyslipidemia 06/06/2015  . Essential hypertension 06/06/2015    Narda Bonds 10/18/2015, 9:20 PM  [facility] [address] [place], [place], [address] Phone: [phone]   Fax:  [phone]  Name: Tricia Huynh MRN:  [id number] Date of Birth: 05-Jun-1956    Narda Bonds, [place] 10/18/2015 9:20 PM Phone: [phone] Fax: [phone]

## 2015-10-23 ENCOUNTER — Ambulatory Visit: Payer: Medicare Other | Admitting: Physical Therapy

## 2015-10-23 DIAGNOSIS — R29898 Other symptoms and signs involving the musculoskeletal system: Secondary | ICD-10-CM | POA: Diagnosis not present

## 2015-10-23 DIAGNOSIS — R269 Unspecified abnormalities of gait and mobility: Secondary | ICD-10-CM

## 2015-10-24 ENCOUNTER — Encounter: Payer: Self-pay | Admitting: Physical Therapy

## 2015-10-24 NOTE — Therapy (Signed)
[facility] [address] [place], [place], [address] Phone: [phone]   Fax:  [phone]  Physical Therapy Treatment  Patient Details  Name: Tricia Huynh MRN: [id number] Date of Birth: Feb 09, 1956 Referring Provider: Reyne Dumas, MD  Encounter Date: 10/23/2015      PT End of Session - 10/24/15 2045    Visit Number 3   Number of Visits 9   Date for PT Re-Evaluation 11/07/15   Authorization Type Medicare   Authorization Time Period 10-08-15  12-07-15   PT Start Time 1502   PT Stop Time 1548   PT Time Calculation (min) 46 min   Equipment Utilized During Treatment Gait belt      Past Medical History  Diagnosis Date  . Diabetes mellitus without complication ([place])   . Hypertension   . Mood disorder ([place]) 06/06/2015  . Essential hypertension 06/06/2015  . Dyslipidemia 06/06/2015  . Diabetic neuropathy ([place]) 06/06/2015  . Diabetes mellitus type 2 in nonobese ([place]) 06/06/2015  . Encephalopathy     Past Surgical History  Procedure Laterality Date  . Abdominal hysterectomy    . Tubal ligation    . Eye surgery Bilateral     There were no vitals filed for this visit.  Visit Diagnosis:  Abnormality of gait  Leg weakness, bilateral      Subjective Assessment - 10/24/15 2038    Subjective Pt reports  she feels she is already getting stronger - doesn't think RW is needed due to not having episodes of L knee buckling since she has been doing HEP   Patient Stated Goals Improve balance and walking; increase LE strength   Currently in Pain? No/denies                         [facility] Adult PT Treatment/Exercise - 10/24/15 0001    Ambulation/Gait   Ambulation/Gait Yes   Ambulation Distance (Feet) 120 Feet   Assistive device None   Gait Pattern Decreased step length - right;Decreased step length - left;Decreased stance time - left   Lumbar Exercises: Aerobic   Stationary Bike 5" Nustep level 3 with  LE's only   Lumbar Exercises: Standing   Heel Raises 10 reps   Knee/Hip Exercises: Machines for Strengthening   Cybex Leg Press bil. LE's 40# 2 sets 10 reps; LLE only 20# 10 reps   Knee/Hip Exercises: Standing   Forward Step Up Both;10 reps   Step Down Left;1 set;5 reps;Step Height: 6"   Functional Squat 10 reps  inside bars with min assist with bil. UE support   Rocker Board 1 minute     TherEx:  seated L and R knee flexion with blue theraband x 10 reps each; seated hip flexion with blue theraband x 10 reps each leg  NeuroRe-ed; tap ups to 4" step x 10 reps each; sit to stand x 5 reps without UE support               PT Long Term Goals - 10/08/15 2109    PT LONG TERM GOAL #1   Title Pt will incr. Berg balance test score to >/= 33/56 to reduce fall risk. (11-07-15)   Baseline 27/56   Time 4   Period Weeks   Status New   PT LONG TERM GOAL #2   Title Amb. 300' with RW for incr. community accessibility. (11-07-15)   Baseline no device used - gait is unsafe placing pt at high fall  risk   Time 4   Period Weeks   Status New   PT LONG TERM GOAL #3   Title Increase bil. LE strength so pt able to perform sit to stand from mat without UE support with SBA.  (11-07-15)   Time 4   Period Weeks   Status New   PT LONG TERM GOAL #4   Title Independent in HEP for balance and strengthening exs.  (11-07-15)   Time 4   Period Weeks   Status New               Plan - 10/24/15 2045    Clinical Impression Statement L knee buckled during sidestepping activity near end of session due to fatigue; assisted pt to lobby after rx session for safety should L knee buckle, but no instability noted with waking after PT session completed   Pt will benefit from skilled therapeutic intervention in order to improve on the following deficits Abnormal gait;Decreased balance;Decreased endurance;Decreased cognition;Decreased mobility;Decreased strength   Rehab Potential Good   PT Frequency 2x /  week   PT Duration 4 weeks   PT Treatment/Interventions ADLs/Self Care Home Management;DME Instruction;Gait training;Stair training;Functional mobility training;Therapeutic activities;Therapeutic exercise;Balance training;Neuromuscular re-education;Patient/family education   PT Next Visit Plan cont ther ex; cont to evaluate need for RW   PT Home Exercise Plan strengthening   Consulted and Agree with Plan of Care Patient        Problem List Patient Active Problem List   Diagnosis Date Noted  . Abnormality of gait 10/04/2015  . Weakness 10/04/2015  . Memory loss 10/04/2015  . Acute encephalopathy 06/06/2015  . Mood disorder ([place]) 06/06/2015  . Diabetes mellitus type 2 in nonobese ([place]) 06/06/2015  . Diabetic neuropathy ([place]) 06/06/2015  . Dyslipidemia 06/06/2015  . Essential hypertension 06/06/2015    Alda Lea, PT 10/24/2015, 8:50 PM  [facility] [address] [place], [place], [address] Phone: [phone]   Fax:  [phone]  Name: Tricia Huynh MRN: [id number] Date of Birth: 1956/11/11

## 2015-10-25 ENCOUNTER — Ambulatory Visit: Payer: Medicare Other | Admitting: Physical Therapy

## 2015-10-30 ENCOUNTER — Ambulatory Visit: Payer: Medicare Other | Admitting: Physical Therapy

## 2015-10-30 DIAGNOSIS — R29898 Other symptoms and signs involving the musculoskeletal system: Secondary | ICD-10-CM

## 2015-10-30 DIAGNOSIS — R269 Unspecified abnormalities of gait and mobility: Secondary | ICD-10-CM | POA: Diagnosis not present

## 2015-10-31 DIAGNOSIS — G40909 Epilepsy, unspecified, not intractable, without status epilepticus: Secondary | ICD-10-CM | POA: Diagnosis not present

## 2015-10-31 DIAGNOSIS — E1142 Type 2 diabetes mellitus with diabetic polyneuropathy: Secondary | ICD-10-CM | POA: Diagnosis not present

## 2015-10-31 DIAGNOSIS — E785 Hyperlipidemia, unspecified: Secondary | ICD-10-CM | POA: Diagnosis not present

## 2015-10-31 DIAGNOSIS — I129 Hypertensive chronic kidney disease with stage 1 through stage 4 chronic kidney disease, or unspecified chronic kidney disease: Secondary | ICD-10-CM | POA: Diagnosis not present

## 2015-10-31 DIAGNOSIS — E1122 Type 2 diabetes mellitus with diabetic chronic kidney disease: Secondary | ICD-10-CM | POA: Diagnosis not present

## 2015-10-31 DIAGNOSIS — N182 Chronic kidney disease, stage 2 (mild): Secondary | ICD-10-CM | POA: Diagnosis not present

## 2015-11-01 ENCOUNTER — Inpatient Hospital Stay: Admission: RE | Admit: 2015-11-01 | Payer: Medicare Other | Source: Ambulatory Visit

## 2015-11-01 ENCOUNTER — Ambulatory Visit: Payer: Medicare Other | Admitting: Physical Therapy

## 2015-11-04 ENCOUNTER — Encounter: Payer: Self-pay | Admitting: Physical Therapy

## 2015-11-04 NOTE — Therapy (Signed)
[facility] [address] [place], [place], [address] Phone: [phone]   Fax:  [phone]  Physical Therapy Treatment  Patient Details  Name: Tricia Huynh MRN: [id number] Date of Birth: 05-Apr-1956 Referring Provider: Reyne Dumas, MD  Encounter Date: 10/30/2015      PT End of Session - 11/04/15 2117    Visit Number 4   Number of Visits 9   Date for PT Re-Evaluation 11/07/15   Authorization Type Medicare   Authorization Time Period 10-08-15  12-07-15   PT Start Time 1401   PT Stop Time 1446   PT Time Calculation (min) 45 min      Past Medical History  Diagnosis Date  . Diabetes mellitus without complication ([place])   . Hypertension   . Mood disorder ([place]) 06/06/2015  . Essential hypertension 06/06/2015  . Dyslipidemia 06/06/2015  . Diabetic neuropathy ([place]) 06/06/2015  . Diabetes mellitus type 2 in nonobese ([place]) 06/06/2015  . Encephalopathy     Past Surgical History  Procedure Laterality Date  . Abdominal hysterectomy    . Tubal ligation    . Eye surgery Bilateral     There were no vitals filed for this visit.  Visit Diagnosis:  Abnormality of gait  Leg weakness, bilateral      Subjective Assessment - 11/04/15 2114    Subjective Pt reports she is doing better - states she has not had any falls within past week   Patient Stated Goals Improve balance and walking; increase LE strength   Currently in Pain? No/denies                         [facility] Adult PT Treatment/Exercise - 11/04/15 0001    Transfers   Transfers Sit to Stand;Stand to Sit   Sit to Stand 4: Min guard   Stand to Sit 5: Supervision   Number of Reps 10 reps   Ambulation/Gait   Ambulation/Gait Yes   Ambulation Distance (Feet) 120 Feet   Assistive device None   Gait Pattern Decreased step length - right;Decreased step length - left;Decreased stance time - left   Ambulation Surface Level;Indoor   Lumbar Exercises:  Aerobic   Stationary Bike 5" Nustep level 3 with LE's only   Lumbar Exercises: Standing   Heel Raises 10 reps   Knee/Hip Exercises: Machines for Strengthening   Cybex Leg Press bil. LE's 40# 2 sets 10 reps; LLE only 20# 10 reps   Knee/Hip Exercises: Standing   Forward Step Up Both;10 reps   Step Down Left;1 set;5 reps;Step Height: 6"   Functional Squat 10 reps  inside bars with min assist with bil. UE support   Rocker Board 2 minutes                     PT Long Term Goals - 10/08/15 2109    PT LONG TERM GOAL #1   Title Pt will incr. Berg balance test score to >/= 33/56 to reduce fall risk. (11-07-15)   Baseline 27/56   Time 4   Period Weeks   Status New   PT LONG TERM GOAL #2   Title Amb. 300' with RW for incr. community accessibility. (11-07-15)   Baseline no device used - gait is unsafe placing pt at high fall risk   Time 4   Period Weeks   Status New   PT LONG TERM GOAL #3   Title Increase bil. LE strength so  pt able to perform sit to stand from mat without UE support with SBA.  (11-07-15)   Time 4   Period Weeks   Status New   PT LONG TERM GOAL #4   Title Independent in HEP for balance and strengthening exs.  (11-07-15)   Time 4   Period Weeks   Status New               Plan - 11/04/15 2117    Clinical Impression Statement Pt had one occurrence of L knee buckling during activity performed inside bars so pt was able to independently recover balance with UE support on bars   Pt will benefit from skilled therapeutic intervention in order to improve on the following deficits Abnormal gait;Decreased balance;Decreased endurance;Decreased cognition;Decreased mobility;Decreased strength   Rehab Potential Good   PT Frequency 2x / week   PT Duration 4 weeks   PT Treatment/Interventions ADLs/Self Care Home Management;DME Instruction;Gait training;Stair training;Functional mobility training;Therapeutic activities;Therapeutic exercise;Balance  training;Neuromuscular re-education;Patient/family education   PT Next Visit Plan cont ther ex   PT Home Exercise Plan strengthening   Consulted and Agree with Plan of Care Patient        Problem List Patient Active Problem List   Diagnosis Date Noted  . Abnormality of gait 10/04/2015  . Weakness 10/04/2015  . Memory loss 10/04/2015  . Acute encephalopathy 06/06/2015  . Mood disorder ([place]) 06/06/2015  . Diabetes mellitus type 2 in nonobese ([place]) 06/06/2015  . Diabetic neuropathy ([place]) 06/06/2015  . Dyslipidemia 06/06/2015  . Essential hypertension 06/06/2015    Alda Lea, PT 11/04/2015, 9:20 PM  [facility] [address] [place], [place], [address] Phone: [phone]   Fax:  [phone]  Name: Jannett Lagle MRN: [id number] Date of Birth: 26-Jun-1956

## 2015-11-06 ENCOUNTER — Ambulatory Visit: Payer: Medicare Other | Admitting: Physical Therapy

## 2015-11-08 ENCOUNTER — Ambulatory Visit: Payer: Medicare Other | Admitting: Physical Therapy

## 2015-11-11 ENCOUNTER — Encounter (HOSPITAL_COMMUNITY): Payer: Self-pay | Admitting: Emergency Medicine

## 2015-11-11 ENCOUNTER — Emergency Department (HOSPITAL_COMMUNITY): Payer: Medicare Other

## 2015-11-11 ENCOUNTER — Emergency Department (HOSPITAL_COMMUNITY)
Admission: EM | Admit: 2015-11-11 | Discharge: 2015-11-11 | Disposition: A | Discharge status: Home / Self Care | Payer: Medicare Other | Attending: Emergency Medicine | Admitting: Emergency Medicine

## 2015-11-11 DIAGNOSIS — R4182 Altered mental status, unspecified: Secondary | ICD-10-CM | POA: Insufficient documentation

## 2015-11-11 DIAGNOSIS — E785 Hyperlipidemia, unspecified: Secondary | ICD-10-CM | POA: Insufficient documentation

## 2015-11-11 DIAGNOSIS — E1140 Type 2 diabetes mellitus with diabetic neuropathy, unspecified: Secondary | ICD-10-CM | POA: Diagnosis not present

## 2015-11-11 DIAGNOSIS — G40909 Epilepsy, unspecified, not intractable, without status epilepticus: Secondary | ICD-10-CM | POA: Diagnosis not present

## 2015-11-11 DIAGNOSIS — Z87891 Personal history of nicotine dependence: Secondary | ICD-10-CM | POA: Diagnosis not present

## 2015-11-11 DIAGNOSIS — R7309 Other abnormal glucose: Secondary | ICD-10-CM | POA: Diagnosis not present

## 2015-11-11 DIAGNOSIS — R739 Hyperglycemia, unspecified: Secondary | ICD-10-CM | POA: Diagnosis not present

## 2015-11-11 DIAGNOSIS — I10 Essential (primary) hypertension: Secondary | ICD-10-CM | POA: Insufficient documentation

## 2015-11-11 DIAGNOSIS — Z79899 Other long term (current) drug therapy: Secondary | ICD-10-CM | POA: Diagnosis not present

## 2015-11-11 DIAGNOSIS — Z8659 Personal history of other mental and behavioral disorders: Secondary | ICD-10-CM | POA: Insufficient documentation

## 2015-11-11 DIAGNOSIS — R401 Stupor: Secondary | ICD-10-CM | POA: Diagnosis not present

## 2015-11-11 LAB — CBC WITH DIFFERENTIAL/PLATELET
Basophils Absolute: 0 10*3/uL (ref 0.0–0.1)
Basophils Relative: 0 %
Eosinophils Absolute: 0 10*3/uL (ref 0.0–0.7)
Eosinophils Relative: 0 %
HCT: 30.7 % — ABNORMAL LOW (ref 36.0–46.0)
Hemoglobin: 10.6 g/dL — ABNORMAL LOW (ref 12.0–15.0)
Lymphocytes Relative: 28 %
Lymphs Abs: 1.5 10*3/uL (ref 0.7–4.0)
MCH: 30.1 pg (ref 26.0–34.0)
MCHC: 34.5 g/dL (ref 30.0–36.0)
MCV: 87.2 fL (ref 78.0–100.0)
Monocytes Absolute: 0.5 10*3/uL (ref 0.1–1.0)
Monocytes Relative: 8 %
Neutro Abs: 3.4 10*3/uL (ref 1.7–7.7)
Neutrophils Relative %: 64 %
Platelets: 225 10*3/uL (ref 150–400)
RBC: 3.52 MIL/uL — ABNORMAL LOW (ref 3.87–5.11)
RDW: 12.4 % (ref 11.5–15.5)
WBC: 5.4 10*3/uL (ref 4.0–10.5)

## 2015-11-11 LAB — URINALYSIS, ROUTINE W REFLEX MICROSCOPIC
Bilirubin Urine: NEGATIVE
Glucose, UA: >1000 mg/dL — AB
Hgb urine dipstick: NEGATIVE
Ketones, ur: NEGATIVE mg/dL
Leukocytes, UA: NEGATIVE
Nitrite: NEGATIVE
Protein, ur: NEGATIVE mg/dL
Specific Gravity, Urine: 1.022 (ref 1.005–1.030)
pH: 7 (ref 5.0–8.0)

## 2015-11-11 LAB — COMPREHENSIVE METABOLIC PANEL
ALT: 13 U/L — ABNORMAL LOW (ref 14–54)
AST: 17 U/L (ref 15–41)
Albumin: 3.7 g/dL (ref 3.5–5.0)
Alkaline Phosphatase: 155 U/L — ABNORMAL HIGH (ref 38–126)
Anion gap: 11 (ref 5–15)
BUN: 18 mg/dL (ref 6–20)
CO2: 25 mmol/L (ref 22–32)
Calcium: 9.6 mg/dL (ref 8.9–10.3)
Chloride: 102 mmol/L (ref 101–111)
Creatinine, Ser: 0.87 mg/dL (ref 0.44–1.00)
GFR calc Af Amer: >60 mL/min (ref >60)
GFR calc non Af Amer: >60 mL/min (ref >60)
Glucose, Bld: 360 mg/dL — ABNORMAL HIGH (ref 65–99)
Potassium: 4.1 mmol/L (ref 3.5–5.1)
Sodium: 138 mmol/L (ref 135–145)
Total Bilirubin: 0.4 mg/dL (ref 0.3–1.2)
Total Protein: 8 g/dL (ref 6.5–8.1)

## 2015-11-11 LAB — URINE MICROSCOPIC-ADD ON

## 2015-11-11 LAB — I-STAT CG4 LACTIC ACID, ED: Lactic Acid, Venous: 1.8 mmol/L (ref 0.5–2.0)

## 2015-11-11 LAB — CK: Total CK: 61 U/L (ref 38–234)

## 2015-11-11 LAB — CBG MONITORING, ED: Glucose-Capillary: 320 mg/dL — ABNORMAL HIGH (ref 65–99)

## 2015-11-11 LAB — ETHANOL: Alcohol, Ethyl (B): <5 mg/dL (ref <5)

## 2015-11-11 LAB — RAPID URINE DRUG SCREEN, HOSP PERFORMED
Amphetamines: NOT DETECTED
Barbiturates: NOT DETECTED
Benzodiazepines: NOT DETECTED
Cocaine: NOT DETECTED
Opiates: NOT DETECTED
Tetrahydrocannabinol: NOT DETECTED

## 2015-11-11 LAB — TROPONIN I: Troponin I: <0.03 ng/mL (ref <0.031)

## 2015-11-11 NOTE — ED Notes (Signed)
EMS vitals 180/110 105 HR 16 R 100%

## 2015-11-11 NOTE — ED Notes (Signed)
Per [facility] pt unresponsive and slumped over bathroom counter semiconscious. Pt was able to tell family that she was not feeling well prior to anbulating to the bathroom. Pt responsive to name. Pt unable to follow commands. Pt attempts to respond to the writer the turns her head and closes her eyes. Pt was non verbal with EMS. EMS unable to do a stroke screen due to  Pt ot following commands. When EMs attempted to check her pupils pt would roll her eyes in her head.

## 2015-11-11 NOTE — Discharge Instructions (Signed)
You apparently had a seizure tonight. Please contact the physician who prescribes your Keppra to see if they want you to increase your dose.   Epilepsy Epilepsy is a disorder in which a person has repeated seizures over time. A seizure is a release of abnormal electrical activity in the brain. Seizures can cause a change in attention, behavior, or the ability to remain awake and alert (altered mental status). Seizures often involve uncontrollable shaking (convulsions).  Most people with epilepsy lead normal lives. However, people with epilepsy are at an increased risk of falls, accidents, and injuries. Therefore, it is important to begin treatment right away. CAUSES  Epilepsy has many possible causes. Anything that disturbs the normal pattern of brain cell activity can lead to seizures. This may include:   Head injury.  Birth trauma.  High fever as a child.  Stroke.  Bleeding into or around the brain.  Certain drugs.  Prolonged low oxygen, such as what occurs after CPR efforts.  Abnormal brain development.  Certain illnesses, such as meningitis, encephalitis (brain infection), malaria, and other infections.  An imbalance of nerve signaling chemicals (neurotransmitters).  SIGNS AND SYMPTOMS  The symptoms of a seizure can vary greatly from one person to another. Right before a seizure, you may have a warning (aura) that a seizure is about to occur. An aura may include the following symptoms:  Fear or anxiety.  Nausea.  Feeling like the room is spinning (vertigo).  Vision changes, such as seeing flashing lights or spots. Common symptoms during a seizure include:  Abnormal sensations, such as an abnormal smell or a bitter taste in the mouth.   Sudden, general body stiffness.   Convulsions that involve rhythmic jerking of the face, arm, or leg on one or both sides.   Sudden change in consciousness.   Appearing to be awake but not responding.   Appearing to be asleep  but cannot be awakened.   Grimacing, chewing, lip smacking, drooling, tongue biting, or loss of bowel or bladder control. After a seizure, you may feel sleepy for a while. DIAGNOSIS  Your health care provider will ask about your symptoms and take a medical history. Descriptions from any witnesses to your seizures will be very helpful in the diagnosis. A physical exam, including a detailed neurological exam, is necessary. Various tests may be done, such as:   An electroencephalogram (EEG). This is a painless test of your brain waves. In this test, a diagram is created of your brain waves. These diagrams can be interpreted by a specialist.  An MRI of the brain.   A CT scan of the brain.   A spinal tap (lumbar puncture, LP).  Blood tests to check for signs of infection or abnormal blood chemistry. TREATMENT  There is no cure for epilepsy, but it is generally treatable. Once epilepsy is diagnosed, it is important to begin treatment as soon as possible. For most people with epilepsy, seizures can be controlled with medicines. The following may also be used:  A pacemaker for the brain (vagus nerve stimulator) can be used for people with seizures that are not well controlled by medicine.  Surgery on the brain. For some people, epilepsy eventually goes away. HOME CARE INSTRUCTIONS   Follow your health care provider's recommendations on driving and safety in normal activities.  Get enough rest. Lack of sleep can cause seizures.  Only take over-the-counter or prescription medicines as directed by your health care provider. Take any prescribed medicine exactly as directed.  Avoid any known triggers of your seizures.  Keep a seizure diary. Record what you recall about any seizure, especially any possible trigger.   Make sure the people you live and work with know that you are prone to seizures. They should receive instructions on how to help you. In general, a witness to a seizure should:    Cushion your head and body.   Turn you on your side.   Avoid unnecessarily restraining you.   Not place anything inside your mouth.   Call for emergency medical help if there is any question about what has occurred.   Follow up with your health care provider as directed. You may need regular blood tests to monitor the levels of your medicine.  SEEK MEDICAL CARE IF:   You develop signs of infection or other illness. This might increase the risk of a seizure.   You seem to be having more frequent seizures.   Your seizure pattern is changing.  SEEK IMMEDIATE MEDICAL CARE IF:   You have a seizure that does not stop after a few moments.   You have a seizure that causes any difficulty in breathing.   You have a seizure that results in a very severe headache.   You have a seizure that leaves you with the inability to speak or use a part of your body.    This information is not intended to replace advice given to you by your health care provider. Make sure you discuss any questions you have with your health care provider.   Document Released: 11/03/2005 Document Revised: 08/24/2013 Document Reviewed: 06/15/2013 Elsevier Interactive Patient Education [employer].

## 2015-11-11 NOTE — ED Notes (Signed)
CBG 320. RN notified.

## 2015-11-11 NOTE — ED Provider Notes (Signed)
CSN: [id number]     Arrival date & time 11/11/15  0406 History   First MD Initiated Contact with Patient 11/11/15 [phone]     Chief complaint: Unresponsive  (Consider location/radiation/quality/duration/timing/severity/associated sxs/prior Treatment) The history is provided by a relative. The history is limited by the condition of the patient (Unresponsive).   a 59 year old female was last known to be normal at about 3:30 AM. She told family members that she was not feeling well and no went into the bathroom. Family went in to look for when she did not come out and noted that she was lying on the floor. There was no evidence of any trauma and no evidence of any incontinence. She does have history of a seizure but there is no evidence of bit lip or tongue. Family states that she had been normal throughout the day yesterday.  Past Medical History  Diagnosis Date  . Diabetes mellitus without complication ([place])   . Hypertension   . Mood disorder ([place]) 06/06/2015  . Essential hypertension 06/06/2015  . Dyslipidemia 06/06/2015  . Diabetic neuropathy ([place]) 06/06/2015  . Diabetes mellitus type 2 in nonobese ([place]) 06/06/2015  . Encephalopathy    Past Surgical History  Procedure Laterality Date  . Abdominal hysterectomy    . Tubal ligation    . Eye surgery Bilateral    Family History  Problem Relation Age of Onset  . Heart disease Father   . Kidney disease Father   . Diabetes Mother    Social History  Substance Use Topics  . Smoking status: Former Research scientist (life sciences)  . Smokeless tobacco: Never Used  . Alcohol Use: No   OB History    No data available     Review of Systems  Unable to perform ROS: Patient unresponsive      Allergies  Percocet and Vicodin  Home Medications   Prior to Admission medications   Medication Sig Start Date End Date Taking? Authorizing Provider  amitriptyline (ELAVIL) 50 MG tablet Take 50 mg by mouth at bedtime. 05/08/15   Historical Provider, MD   amLODipine-olmesartan (AZOR) 5-40 MG per tablet Take 1 tablet by mouth daily.    Historical Provider, MD  aspirin-acetaminophen-caffeine (EXCEDRIN MIGRAINE) [phone] MG per tablet Take 2 tablets by mouth every 6 (six) hours as needed for headache.    Historical Provider, MD  atorvastatin (LIPITOR) 40 MG tablet Take 40 mg by mouth daily. 05/10/15   Historical Provider, MD  BD PEN NEEDLE NANO U/F 32G X 4 MM MISC 2 (two) times daily. as directed 07/19/15   Historical Provider, MD  gabapentin (NEURONTIN) 600 MG tablet Take 600 mg by mouth 2 (two) times daily.  10/08/14   Historical Provider, MD  glipiZIDE (GLUCOTROL) 5 MG tablet Take 0.5 tablets (2.5 mg total) by mouth daily before breakfast. 06/07/15   Reyne Dumas, MD  hydrochlorothiazide (HYDRODIURIL) 25 MG tablet Take 1 tablet (25 mg total) by mouth daily. 05/22/15   Hannah Muthersbaugh, PA-C  levETIRAcetam (KEPPRA) 500 MG tablet One in the morning, 2 tabs at night 10/04/15   Marcial Pacas, MD  metFORMIN (GLUCOPHAGE) 500 MG tablet Take 500 mg by mouth 2 (two) times daily. 10/09/14   Historical Provider, MD  metoprolol succinate (TOPROL-XL) 25 MG 24 hr tablet Take 50 mg by mouth at bedtime. 05/08/15   Historical Provider, MD  NOVOLOG FLEXPEN 100 UNIT/ML FlexPen Inject 20-50 Units into the skin 2 (two) times daily. 50 units in am and 20 units in pm 10/08/14   Historical Provider,  MD  ranitidine (ZANTAC) 150 MG tablet Take 1 tablet (150 mg total) by mouth 2 (two) times daily. Patient not taking: Reported on 10/04/2015 10/18/14   Blanchie Dessert, MD  sucralfate (CARAFATE) 1 G tablet Take 1 tablet (1 g total) by mouth 4 (four) times daily -  with meals and at bedtime. 10/18/14   Blanchie Dessert, MD   BP 183/120 mmHg  Pulse 102  Temp(Src) 98.1 F (36.7 C) (Oral)  Resp 15  SpO2 99% Physical Exam  Nursing note and vitals reviewed.  59 year old female, resting comfortably and in no acute distress. Vital signs arsignificant for tachycardia and hypertension.  Oxygen saturation is 99%, which is normal. Head is normocephalic and atraumatic. PERRLA, EOMI. Oropharynx is clear. Neck is nontender and supple without adenopathy or JVD. Back is nontender and there is no CVA tenderness. Lungs are clear without rales, wheezes, or rhonchi. Chest is nontender. Heart has regular rate and rhythm without murmur. Abdomen is soft, flat, nontender without masses or hepatosplenomegaly and peristalsis is normoactive. Extremities have no cyanosis or edema, full range of motion is present. Skin is warm and dry without rash. Neurologic: she is arousable to voice and to pain and will follow a few simple commands but is nonverbal. There is a vague sense that she may have some very slight weakness on the right but she does move all of her extremities, and finding of possible weakness is not consistent.  ED Course  Procedures (including critical care time) Labs Review Results for orders placed or performed during the hospital encounter of 11/11/15  Comprehensive metabolic panel  Result Value Ref Range   Sodium 138 135 - 145 mmol/L   Potassium 4.1 3.5 - 5.1 mmol/L   Chloride 102 101 - 111 mmol/L   CO2 25 22 - 32 mmol/L   Glucose, Bld 360 (H) 65 - 99 mg/dL   BUN 18 6 - 20 mg/dL   Creatinine, Ser 0.87 0.44 - 1.00 mg/dL   Calcium 9.6 8.9 - 10.3 mg/dL   Total Protein 8.0 6.5 - 8.1 g/dL   Albumin 3.7 3.5 - 5.0 g/dL   AST 17 15 - 41 U/L   ALT 13 (L) 14 - 54 U/L   Alkaline Phosphatase 155 (H) 38 - 126 U/L   Total Bilirubin 0.4 0.3 - 1.2 mg/dL   GFR calc non Af Amer >60 >60 mL/min   GFR calc Af Amer >60 >60 mL/min   Anion gap 11 5 - 15  CBC with Differential  Result Value Ref Range   WBC 5.4 4.0 - 10.5 K/uL   RBC 3.52 (L) 3.87 - 5.11 MIL/uL   Hemoglobin 10.6 (L) 12.0 - 15.0 g/dL   HCT 30.7 (L) 36.0 - 46.0 %   MCV 87.2 78.0 - 100.0 fL   MCH 30.1 26.0 - 34.0 pg   MCHC 34.5 30.0 - 36.0 g/dL   RDW 12.4 11.5 - 15.5 %   Platelets 225 150 - 400 K/uL   Neutrophils  Relative % 64 %   Neutro Abs 3.4 1.7 - 7.7 K/uL   Lymphocytes Relative 28 %   Lymphs Abs 1.5 0.7 - 4.0 K/uL   Monocytes Relative 8 %   Monocytes Absolute 0.5 0.1 - 1.0 K/uL   Eosinophils Relative 0 %   Eosinophils Absolute 0.0 0.0 - 0.7 K/uL   Basophils Relative 0 %   Basophils Absolute 0.0 0.0 - 0.1 K/uL  Troponin I  Result Value Ref Range   Troponin I <  0.03 <0.031 ng/mL  Urinalysis, Routine w reflex microscopic  Result Value Ref Range   Color, Urine STRAW (A) YELLOW   APPearance CLEAR CLEAR   Specific Gravity, Urine 1.022 1.005 - 1.030   pH 7.0 5.0 - 8.0   Glucose, UA >1000 (A) NEGATIVE mg/dL   Hgb urine dipstick NEGATIVE NEGATIVE   Bilirubin Urine NEGATIVE NEGATIVE   Ketones, ur NEGATIVE NEGATIVE mg/dL   Protein, ur NEGATIVE NEGATIVE mg/dL   Nitrite NEGATIVE NEGATIVE   Leukocytes, UA NEGATIVE NEGATIVE  Urine rapid drug screen (hosp performed)  Result Value Ref Range   Opiates NONE DETECTED NONE DETECTED   Cocaine NONE DETECTED NONE DETECTED   Benzodiazepines NONE DETECTED NONE DETECTED   Amphetamines NONE DETECTED NONE DETECTED   Tetrahydrocannabinol NONE DETECTED NONE DETECTED   Barbiturates NONE DETECTED NONE DETECTED  Ethanol  Result Value Ref Range   Alcohol, Ethyl (B) <5 <5 mg/dL  CK  Result Value Ref Range   Total CK 61 38 - 234 U/L  Urine microscopic-add on  Result Value Ref Range   Squamous Epithelial / LPF 0-5 (A) NONE SEEN   WBC, UA 0-5 0 - 5 WBC/hpf   RBC / HPF 0-5 0 - 5 RBC/hpf   Bacteria, UA RARE (A) NONE SEEN  CBG monitoring, ED  Result Value Ref Range   Glucose-Capillary 320 (H) 65 - 99 mg/dL  I-Stat CG4 Lactic Acid, ED  Result Value Ref Range   Lactic Acid, Venous 1.80 0.5 - 2.0 mmol/L    Imaging Review Ct Head Wo Contrast  11/11/2015  CLINICAL DATA:  Unresponsive. EXAM: CT HEAD WITHOUT CONTRAST TECHNIQUE: Contiguous axial images were obtained from the base of the skull through the vertex without intravenous contrast. COMPARISON:  09/18/2015  FINDINGS: Prominence of the sulci and ventricles are identified compatible with brain atrophy. No acute cortical infarct, hemorrhage, or mass lesion ispresent. No significant extra-axial fluid collection is present. The paranasal sinuses andmastoid air cells are clear. The osseous skull is intact. IMPRESSION: 1. No acute intracranial abnormalities. 2. Brain atrophy. Electronically Signed   By: Kerby Moors M.D.   On: 11/11/2015 07:19   I have personally reviewed and evaluated these images and lab results as part of my medical decision-making.   EKG Interpretation   Date/Time:  Sunday November 11 2015 04:13:29 EST Ventricular Rate:  98 PR Interval:  178 QRS Duration: 86 QT Interval:  406 QTC Calculation: 518 R Axis:   0 Text Interpretation:  Age not entered, assumed to be  59 years old for  purpose of ECG interpretation Sinus rhythm Low voltage, extremity leads  Abnormal R-wave progression, early transition Prolonged QT interval When  compared with ECG of 09/18/2015, QT has lengthened Confirmed by Roxanne Mins  MD,  Dory Demont ([id number]) on 11/11/2015 4:18:10 AM      MDM   Final diagnoses:  Altered mental status, unspecified altered mental status type  Seizure disorder ([place])    Altered mental status of uncertain cause. No clear evidence of stroke. Possible postictal phase of seizure. Old records are reviewed and she does have an ED visit for syncope and does see a neurologist for seizure disorder and is on levetiracetam for that.  Workup in the ED is unremarkable. Patient has returned to normal mental status. Family member who came with her is sleeping and has not been able to verify returned to baseline. However, patient is awake, alert, conversant and oriented. She states that she has not missed any doses of her levetiracetam. Everything  at this point seems to indicate that she had a seizure and was post ictal. I recommended that she contact the physician who prescribes her levetiracetam to see if  they wish to increase her dose.  Delora Fuel, MD [phone]

## 2015-11-11 NOTE — ED Notes (Signed)
MD at bedside. 

## 2015-11-13 ENCOUNTER — Inpatient Hospital Stay: Admission: RE | Admit: 2015-11-13 | Payer: Medicare Other | Source: Ambulatory Visit

## 2015-11-15 DIAGNOSIS — E1122 Type 2 diabetes mellitus with diabetic chronic kidney disease: Secondary | ICD-10-CM | POA: Diagnosis not present

## 2015-11-15 DIAGNOSIS — E785 Hyperlipidemia, unspecified: Secondary | ICD-10-CM | POA: Diagnosis not present

## 2015-11-15 DIAGNOSIS — I129 Hypertensive chronic kidney disease with stage 1 through stage 4 chronic kidney disease, or unspecified chronic kidney disease: Secondary | ICD-10-CM | POA: Diagnosis not present

## 2015-11-15 DIAGNOSIS — N182 Chronic kidney disease, stage 2 (mild): Secondary | ICD-10-CM | POA: Diagnosis not present

## 2015-11-15 DIAGNOSIS — E1142 Type 2 diabetes mellitus with diabetic polyneuropathy: Secondary | ICD-10-CM | POA: Diagnosis not present

## 2015-11-15 DIAGNOSIS — G40909 Epilepsy, unspecified, not intractable, without status epilepticus: Secondary | ICD-10-CM | POA: Diagnosis not present

## 2015-11-21 ENCOUNTER — Ambulatory Visit: Payer: Medicare Other | Attending: Internal Medicine | Admitting: Physical Therapy

## 2015-11-21 ENCOUNTER — Telehealth: Payer: Self-pay | Admitting: Physical Therapy

## 2015-11-21 DIAGNOSIS — R269 Unspecified abnormalities of gait and mobility: Secondary | ICD-10-CM | POA: Diagnosis not present

## 2015-11-21 DIAGNOSIS — R29898 Other symptoms and signs involving the musculoskeletal system: Secondary | ICD-10-CM | POA: Diagnosis not present

## 2015-11-21 NOTE — Telephone Encounter (Signed)
Dr. Stephanie Huynh, Parke Simmers ([birth year]) has been receiving OP PT for approx. 3 weeks; she went to  [facility] ED on 11-11-15 with unconsciousness due to having another seizure.  She now has residual weakness since this episode and her knees will buckle with fatigue (she reports having had several falls).  She would benefit from use of rolling walker to increase safety with ambulation and decrease fall risk. She is in agreement with recommendation for this device.  If you agree, would you please place order through Epic for a rolling walker and we will obtain one for her.  Thank you, Guido Sander, PT

## 2015-11-22 ENCOUNTER — Ambulatory Visit
Admission: RE | Admit: 2015-11-22 | Discharge: 2015-11-22 | Disposition: A | Discharge status: Home / Self Care | Payer: Medicare Other | Source: Ambulatory Visit | Attending: Neurology | Admitting: Neurology

## 2015-11-22 ENCOUNTER — Ambulatory Visit: Payer: Medicare Other | Admitting: Physical Therapy

## 2015-11-22 ENCOUNTER — Encounter: Payer: Self-pay | Admitting: Physical Therapy

## 2015-11-22 DIAGNOSIS — F39 Unspecified mood [affective] disorder: Secondary | ICD-10-CM

## 2015-11-22 DIAGNOSIS — R531 Weakness: Secondary | ICD-10-CM

## 2015-11-22 DIAGNOSIS — M5127 Other intervertebral disc displacement, lumbosacral region: Secondary | ICD-10-CM | POA: Diagnosis not present

## 2015-11-22 DIAGNOSIS — R413 Other amnesia: Secondary | ICD-10-CM

## 2015-11-22 DIAGNOSIS — R269 Unspecified abnormalities of gait and mobility: Secondary | ICD-10-CM

## 2015-11-22 DIAGNOSIS — M5136 Other intervertebral disc degeneration, lumbar region: Secondary | ICD-10-CM | POA: Diagnosis not present

## 2015-11-22 NOTE — Therapy (Signed)
[facility] [address] [place], [place], [address] Phone: [phone]   Fax:  [phone]  Physical Therapy Treatment  Patient Details  Name: Tricia Huynh MRN: [id number] Date of Birth: 06-08-1956 Referring Provider: Reyne Dumas, MD  Encounter Date: 11/21/2015      PT End of Session - 11/22/15 1202    Visit Number 5   Number of Visits 9   Date for PT Re-Evaluation 12/07/15  pt has not been to PT within past 3 weeks due to transportation issues   Authorization Type Medicare   Authorization Time Period 10-08-15  12-07-15   PT Start Time 1445   PT Stop Time 1530   PT Time Calculation (min) 45 min      Past Medical History  Diagnosis Date  . Diabetes mellitus without complication ([place])   . Hypertension   . Mood disorder ([place]) 06/06/2015  . Essential hypertension 06/06/2015  . Dyslipidemia 06/06/2015  . Diabetic neuropathy ([place]) 06/06/2015  . Diabetes mellitus type 2 in nonobese ([place]) 06/06/2015  . Encephalopathy     Past Surgical History  Procedure Laterality Date  . Abdominal hysterectomy    . Tubal ligation    . Eye surgery Bilateral     There were no vitals filed for this visit.  Visit Diagnosis:  Abnormality of gait  Leg weakness, bilateral      Subjective Assessment - 11/22/15 1157    Subjective Pt reports she went to ED on 11-11-15 due to having a seizure and was found unconscious by her granddaughter (was not admitted); pt reports legs have been weaker since this seizure   Patient Stated Goals Improve balance and walking; increase LE strength   Currently in Pain? No/denies                         [facility] Adult PT Treatment/Exercise - 11/22/15 0001    Transfers   Transfers Sit to Stand;Stand to Sit   Sit to Stand 4: Min guard   Number of Reps Other reps (comment)  5 reps   Ambulation/Gait   Ambulation/Gait Yes   Ambulation/Gait Assistance 5: Supervision   Ambulation  Distance (Feet) 120 Feet   Assistive device Rolling walker   Ambulation Surface Level;Indoor   Lumbar Exercises: Aerobic   Stationary Bike 5" Nustep level 3 with LE's only   Lumbar Exercises: Standing   Heel Raises 10 reps   Knee/Hip Exercises: Machines for Strengthening   Cybex Leg Press bil. LE's 40# 2 sets 10 reps; LLE only 20# 10 reps   Knee/Hip Exercises: Standing   Functional Squat 1 set;5 reps   Rocker Board 1 minute      Bil. Knees buckled after activities inside bars - pt able to catch herself with UE support on bars and prevent fall; discussed use of RW and  benefits to reduce fall risk due to knees buckling spontaneously - pt agrees with recommendation          PT Education - 11/22/15 1202    Education provided Yes   Education Details benefits of RW    Person(s) Educated Patient   Methods Explanation   Comprehension Verbalized understanding             PT Long Term Goals - 10/08/15 2109    PT LONG TERM GOAL #1   Title Pt will incr. Berg balance test score to >/= 33/56 to reduce fall risk. (11-07-15)   Baseline 27/56  Time 4   Period Weeks   Status New   PT LONG TERM GOAL #2   Title Amb. 300' with RW for incr. community accessibility. (11-07-15)   Baseline no device used - gait is unsafe placing pt at high fall risk   Time 4   Period Weeks   Status New   PT LONG TERM GOAL #3   Title Increase bil. LE strength so pt able to perform sit to stand from mat without UE support with SBA.  (11-07-15)   Time 4   Period Weeks   Status New   PT LONG TERM GOAL #4   Title Independent in HEP for balance and strengthening exs.  (11-07-15)   Time 4   Period Weeks   Status New               Plan - 11/22/15 1204    Clinical Impression Statement Pt having incr. quad weakness since seizure on 11-11-15 - knees buckled inside bars after closed chain activities (due to fatigue) with pt able to regain LOB with UE support on parallel bars; pt would benefit  from use of RW to decrease fall risk and to increase safety with ambulation - sent MD request for order for RW   Pt will benefit from skilled therapeutic intervention in order to improve on the following deficits Abnormal gait;Decreased balance;Decreased endurance;Decreased cognition;Decreased mobility;Decreased strength   Rehab Potential Good   PT Frequency 2x / week   PT Duration 4 weeks   PT Treatment/Interventions ADLs/Self Care Home Management;DME Instruction;Gait training;Stair training;Functional mobility training;Therapeutic activities;Therapeutic exercise;Balance training;Neuromuscular re-education;Patient/family education   PT Next Visit Plan cont ther ex   PT Home Exercise Plan strengthening   Consulted and Agree with Plan of Care Patient        Problem List Patient Active Problem List   Diagnosis Date Noted  . Abnormality of gait 10/04/2015  . Weakness 10/04/2015  . Memory loss 10/04/2015  . Acute encephalopathy 06/06/2015  . Mood disorder ([place]) 06/06/2015  . Diabetes mellitus type 2 in nonobese ([place]) 06/06/2015  . Diabetic neuropathy ([place]) 06/06/2015  . Dyslipidemia 06/06/2015  . Essential hypertension 06/06/2015    Alda Lea, PT 11/22/2015, 12:09 PM  [facility] [address] [place], [place], [address] Phone: [phone]   Fax:  [phone]  Name: Tricia Huynh MRN: [id number] Date of Birth: 1956/09/16

## 2015-11-26 ENCOUNTER — Ambulatory Visit: Payer: Medicare Other | Admitting: Physical Therapy

## 2015-11-26 ENCOUNTER — Telehealth: Payer: Self-pay | Admitting: Adult Health

## 2015-11-26 NOTE — Telephone Encounter (Signed)
I called the patient- spoke to a family member. Informed that the office would be opening tomorrow at 10. The patient would need to call and reschedule appointment.

## 2015-11-27 ENCOUNTER — Ambulatory Visit (INDEPENDENT_AMBULATORY_CARE_PROVIDER_SITE_OTHER): Payer: Self-pay | Admitting: Neurology

## 2015-11-27 ENCOUNTER — Ambulatory Visit (INDEPENDENT_AMBULATORY_CARE_PROVIDER_SITE_OTHER): Payer: Medicare Other | Admitting: Neurology

## 2015-11-27 ENCOUNTER — Encounter: Payer: Medicare Other | Admitting: Neurology

## 2015-11-27 ENCOUNTER — Ambulatory Visit: Payer: Medicare Other | Admitting: Physical Therapy

## 2015-11-27 ENCOUNTER — Telehealth: Payer: Self-pay | Admitting: Physical Therapy

## 2015-11-27 ENCOUNTER — Encounter: Payer: Self-pay | Admitting: Physical Therapy

## 2015-11-27 DIAGNOSIS — R269 Unspecified abnormalities of gait and mobility: Secondary | ICD-10-CM

## 2015-11-27 DIAGNOSIS — E1142 Type 2 diabetes mellitus with diabetic polyneuropathy: Secondary | ICD-10-CM

## 2015-11-27 DIAGNOSIS — F39 Unspecified mood [affective] disorder: Secondary | ICD-10-CM

## 2015-11-27 DIAGNOSIS — R29898 Other symptoms and signs involving the musculoskeletal system: Secondary | ICD-10-CM | POA: Diagnosis not present

## 2015-11-27 DIAGNOSIS — R413 Other amnesia: Secondary | ICD-10-CM

## 2015-11-27 DIAGNOSIS — R531 Weakness: Secondary | ICD-10-CM

## 2015-11-27 DIAGNOSIS — Z0289 Encounter for other administrative examinations: Secondary | ICD-10-CM

## 2015-11-27 NOTE — Telephone Encounter (Signed)
I have put in the order for rolling walker

## 2015-11-27 NOTE — Progress Notes (Signed)
There is evidence of slight length dependent axonal sensorimotor polyneuropathy, consistent with her diagnosis of poorly controlled diabetes, there is no evidence of left lumbosacral radiculopathy.

## 2015-11-27 NOTE — Therapy (Signed)
[facility] [address] [place], [place], [address] Phone: [phone]   Fax:  [phone]  Physical Therapy Treatment  Patient Details  Name: Tricia Huynh MRN: [id number] Date of Birth: 1956-01-31 Referring Provider: Reyne Dumas, MD  Encounter Date: 11/27/2015      PT End of Session - 11/27/15 2005    Visit Number 6   Number of Visits 9   Date for PT Re-Evaluation 12/07/15   Authorization Type Medicare   Authorization Time Period 10-08-15  12-07-15   PT Start Time 1125   PT Stop Time 1210   PT Time Calculation (min) 45 min      Past Medical History  Diagnosis Date  . Diabetes mellitus without complication ([place])   . Hypertension   . Mood disorder ([place]) 06/06/2015  . Essential hypertension 06/06/2015  . Dyslipidemia 06/06/2015  . Diabetic neuropathy ([place]) 06/06/2015  . Diabetes mellitus type 2 in nonobese ([place]) 06/06/2015  . Encephalopathy     Past Surgical History  Procedure Laterality Date  . Abdominal hysterectomy    . Tubal ligation    . Eye surgery Bilateral     There were no vitals filed for this visit.  Visit Diagnosis:  Abnormality of gait  Leg weakness, bilateral      Subjective Assessment - 11/27/15 1934    Subjective Pt reports she just had nerve conduction test done by Dr. [name] prior to PT session today - pt walking in with hand held assist from her daughter   Patient Stated Goals Improve balance and walking; increase LE strength   Currently in Pain? No/denies                         [facility] Adult PT Treatment/Exercise - 11/27/15 0001    Transfers   Transfers Sit to Stand   Sit to Stand 4: Min guard  bil. knees buckled   Number of Reps Other reps (comment)  1 rep only due to knees buckling   Ambulation/Gait   Ambulation/Gait Yes   Ambulation/Gait Assistance 4: Min guard   Ambulation Distance (Feet) 75 Feet   Assistive device Rolling walker   Ambulation Surface  Level;Indoor   Lumbar Exercises: Supine   Bent Knee Raise 10 reps   Bridge 10 reps   Straight Leg Raise 10 reps   Other Supine Lumbar Exercises hip abduction in hooklying x 10 reps  with manual resistance   Knee/Hip Exercises: Supine   Single Leg Bridge Both;1 set;10 reps   Straight Leg Raises AROM;Both;1 set;10 reps                PT Education - 11/27/15 2004    Education provided Yes   Education Details correct use of RW   Person(s) Educated Patient   Methods Explanation;Demonstration   Comprehension Verbalized understanding;Returned demonstration             PT Long Term Goals - 10/08/15 2109    PT LONG TERM GOAL #1   Title Pt will incr. Berg balance test score to >/= 33/56 to reduce fall risk. (11-07-15)   Baseline 27/56   Time 4   Period Weeks   Status New   PT LONG TERM GOAL #2   Title Amb. 300' with RW for incr. community accessibility. (11-07-15)   Baseline no device used - gait is unsafe placing pt at high fall risk   Time 4   Period Weeks   Status New  PT LONG TERM GOAL #3   Title Increase bil. LE strength so pt able to perform sit to stand from mat without UE support with SBA.  (11-07-15)   Time 4   Period Weeks   Status New   PT LONG TERM GOAL #4   Title Independent in HEP for balance and strengthening exs.  (11-07-15)   Time 4   Period Weeks   Status New               Plan - 11/27/15 2005    Clinical Impression Statement Pt having more weakness in bil. quads today resulting in buckling of knees and moderate LOB requiring assistance to prevent fall; pt issued RW and instructed in correct use with daughter instructed also - pt unable to walk unassisted today due to bil. knee instability   Pt will benefit from skilled therapeutic intervention in order to improve on the following deficits Abnormal gait;Decreased balance;Decreased endurance;Decreased cognition;Decreased mobility;Decreased strength   Rehab Potential Good   PT Frequency  2x / week   PT Duration 4 weeks   PT Treatment/Interventions ADLs/Self Care Home Management;DME Instruction;Gait training;Stair training;Functional mobility training;Therapeutic activities;Therapeutic exercise;Balance training;Neuromuscular re-education;Patient/family education   PT Next Visit Plan cont ther ex   PT Home Exercise Plan strengthening   Consulted and Agree with Plan of Care Patient;Family member/caregiver   Family Member Consulted daughter        Problem List Patient Active Problem List   Diagnosis Date Noted  . Abnormality of gait 10/04/2015  . Weakness 10/04/2015  . Memory loss 10/04/2015  . Acute encephalopathy 06/06/2015  . Mood disorder ([place]) 06/06/2015  . Diabetes mellitus type 2 in nonobese ([place]) 06/06/2015  . Diabetic neuropathy ([place]) 06/06/2015  . Dyslipidemia 06/06/2015  . Essential hypertension 06/06/2015    Alda Lea, PT 11/27/2015, 8:16 PM  [facility] [address] [place], [place], [address] Phone: [phone]   Fax:  [phone]  Name: Tricia Huynh MRN: [id number] Date of Birth: [birth year]

## 2015-11-27 NOTE — Telephone Encounter (Signed)
Dr. [name], Would you please place an order for a rolling walker for Tricia Huynh (DOB [birth year]) in [place]? We have tried unsuccessfully to obtain an order from Dr. Allyson Sabal and I sent a request last Wed. To her PCP, Dr. Stephanie Acre but have not received it yet. She came to PT today after her NCV testing with you and almost fell, due to both knees buckling at the same time.  I went ahead and gave her a RW because of her extremely high fall risk, and I didn't feel comfortable with her leaving today without an assistive device.  I am very concerned for her safety based on her status in the clinic today - she was the weakest that she has been since she has been coming to PT.  Thank you in advance for your assistance in this matter - I really appreciate your help - Guido Sander, PT

## 2015-11-27 NOTE — Addendum Note (Signed)
Addended by: Marcial Pacas on: 11/27/2015 01:14 PM   Modules accepted: Orders

## 2015-11-27 NOTE — Procedures (Addendum)
   NCS (NERVE CONDUCTION STUDY) WITH EMG (ELECTROMYOGRAPHY) REPORT   STUDY DATE: Jan 10th 2017 PATIENT NAME: Tricia Huynh DOB: 1956-01-26 MRN: [id number]    TECHNOLOGIST: Laretta Alstrom ELECTROMYOGRAPHER: Marcial Pacas M.D.  CLINICAL INFORMATION:  60 years old female, with poorly controlled diabetes, bilateral lower extremity pain, gait difficulty, seizure  FINDINGS: NERVE CONDUCTION STUDY: Bilateral peroneal sensory response showed mildly decreased snap amplitude, with normal peak latency, left worse than right.  Left peroneal to EDB and right tibial motor responses showed mildly decreased to C map amplitude, with normal distal latency, conduction velocity and F-wave latency.  Right peroneal to EDB and left motor response were within normal limit, with low normal range C map amplitude.  Bilateral tibial H reflexes were present and symmetric.  NEEDLE ELECTROMYOGRAPHY: Selected needle examination was performed at left lower extremity muscles and left lumbosacral paraspinal muscles.  Needle examination of left tibialis anterior, tibialis posterior, peroneal longus, medial gastrocnemius, vastus lateralis, gluteus medius was normal.  There was no spontaneous activity at left lumbar sacral paraspinal muscles, left L4-5 S1.  IMPRESSION:   This is a slight abnormal study. There is electrodiagnostic evidence of mild axonal length dependent sensorimotor polyneuropathy, consistent with her diagnosis of poorly controlled diabetes. There is no evidence of left lumbar sacral radiculopathy.    INTERPRETING PHYSICIAN:   Marcial Pacas M.D. Ph.D. [facility] [address] [place], [place] [address] [phone]

## 2015-11-29 DIAGNOSIS — N182 Chronic kidney disease, stage 2 (mild): Secondary | ICD-10-CM | POA: Diagnosis not present

## 2015-11-29 DIAGNOSIS — E1122 Type 2 diabetes mellitus with diabetic chronic kidney disease: Secondary | ICD-10-CM | POA: Diagnosis not present

## 2015-11-29 DIAGNOSIS — G40909 Epilepsy, unspecified, not intractable, without status epilepticus: Secondary | ICD-10-CM | POA: Diagnosis not present

## 2015-11-29 DIAGNOSIS — E785 Hyperlipidemia, unspecified: Secondary | ICD-10-CM | POA: Diagnosis not present

## 2015-11-29 DIAGNOSIS — E1142 Type 2 diabetes mellitus with diabetic polyneuropathy: Secondary | ICD-10-CM | POA: Diagnosis not present

## 2015-11-29 DIAGNOSIS — I129 Hypertensive chronic kidney disease with stage 1 through stage 4 chronic kidney disease, or unspecified chronic kidney disease: Secondary | ICD-10-CM | POA: Diagnosis not present

## 2015-12-04 ENCOUNTER — Ambulatory Visit: Payer: Medicare Other | Admitting: Physical Therapy

## 2015-12-04 DIAGNOSIS — R269 Unspecified abnormalities of gait and mobility: Secondary | ICD-10-CM

## 2015-12-04 DIAGNOSIS — R29898 Other symptoms and signs involving the musculoskeletal system: Secondary | ICD-10-CM

## 2015-12-04 NOTE — Patient Instructions (Addendum)
(  Clinic) Knee: Extension - Sitting    Pulley behind, sit with knee over edge of bench. Lift leg, straighten knee. Repeat __10__ times per set. Do __2__ sets per session. Do __6__ sessions per week. USE RED THERABAND  Copyright  VHI. All rights reserved.  Hip: Adduction / Abduction    Get ON TARGET. Between knees, place ball large enough to put hips in neutral. Do not twist hips out of position. 1.Squeeze top knee toward floor. 2. Lift top knee. Hold each position __3_ seconds. Repeat sequence _10__ times. Lie on left side. Do _2__ sessions per day.  Copyright  VHI. All rights reserved.  Strengthening: Straight Leg Raise (Phase 1)    Tighten muscles on front of right thigh, then lift leg _10___ inches from surface, keeping knee locked.  Repeat __2__ times per set. Do __2__ sets per session. Do 1-2 sessions per day.  http://orth.exer.us/615   Copyright  VHI. All rights reserved.  Bridging    Slowly raise buttocks from floor, keeping stomach tight. Repeat _10___ times per set. Do _2___ sets per session. Do _1-2___ sessions per day.  http://orth.exer.us/1097   Copyright  VHI. All rights reserved.

## 2015-12-06 ENCOUNTER — Ambulatory Visit: Payer: Medicare Other | Admitting: Physical Therapy

## 2015-12-06 DIAGNOSIS — F324 Major depressive disorder, single episode, in partial remission: Secondary | ICD-10-CM | POA: Diagnosis not present

## 2015-12-06 DIAGNOSIS — M5136 Other intervertebral disc degeneration, lumbar region: Secondary | ICD-10-CM | POA: Diagnosis not present

## 2015-12-06 DIAGNOSIS — E1121 Type 2 diabetes mellitus with diabetic nephropathy: Secondary | ICD-10-CM | POA: Diagnosis not present

## 2015-12-06 DIAGNOSIS — G629 Polyneuropathy, unspecified: Secondary | ICD-10-CM | POA: Diagnosis not present

## 2015-12-06 DIAGNOSIS — R21 Rash and other nonspecific skin eruption: Secondary | ICD-10-CM | POA: Diagnosis not present

## 2015-12-06 DIAGNOSIS — I10 Essential (primary) hypertension: Secondary | ICD-10-CM | POA: Diagnosis not present

## 2015-12-06 LAB — HEMOGLOBIN A1C: Hemoglobin A1C: 11.8

## 2015-12-07 ENCOUNTER — Encounter: Payer: Self-pay | Admitting: Physical Therapy

## 2015-12-07 NOTE — Therapy (Signed)
[facility] [address] [place], [place], [address] Phone: [phone]   Fax:  [phone]  Physical Therapy Treatment  Patient Details  Name: Tricia Huynh MRN: [id number] Date of Birth: Jul 29, 1956 Referring Provider: Reyne Dumas, MD  Encounter Date: 12/04/2015      PT End of Session - 12/07/15 1447    Visit Number 7   Number of Visits 9   Date for PT Re-Evaluation 12/07/15   Authorization Type Medicare   Authorization Time Period 10-08-15  12-07-15   PT Start Time 1533   PT Stop Time 1622   PT Time Calculation (min) 49 min      Past Medical History  Diagnosis Date  . Diabetes mellitus without complication ([place])   . Hypertension   . Mood disorder ([place]) 06/06/2015  . Essential hypertension 06/06/2015  . Dyslipidemia 06/06/2015  . Diabetic neuropathy ([place]) 06/06/2015  . Diabetes mellitus type 2 in nonobese ([place]) 06/06/2015  . Encephalopathy     Past Surgical History  Procedure Laterality Date  . Abdominal hysterectomy    . Tubal ligation    . Eye surgery Bilateral     There were no vitals filed for this visit.  Visit Diagnosis:  Leg weakness, bilateral  Abnormality of gait      Subjective Assessment - 12/07/15 1442    Subjective Pt states legs remain very weak with knees buckling - states RW has been very useful   Patient Stated Goals Improve balance and walking; increase LE strength   Currently in Pain? No/denies                         [facility] Adult PT Treatment/Exercise - 12/07/15 0001    Ambulation/Gait   Ambulation/Gait Yes   Ambulation/Gait Assistance 4: Min assist   Ambulation Distance (Feet) 50 Feet  2 reps    Assistive device Rolling walker   Gait Pattern Decreased step length - right;Decreased step length - left;Decreased stance time - left   Ambulation Surface Level;Indoor   Lumbar Exercises: Aerobic   Stationary Bike 5" Nustep level 3 with LE's only   Lumbar  Exercises: Supine   Bent Knee Raise 10 reps   Bridge 10 reps   Straight Leg Raise 10 reps   Other Supine Lumbar Exercises hip abduction with red theraband x 10 reps   Lumbar Exercises: Sidelying   Clam 10 reps   Hip Abduction 10 reps   Knee/Hip Exercises: Seated   Long Arc Quad Both;1 set;10 reps   Hamstring Curl Both;1 set;10 reps  with red theraband                PT Education - 12/07/15 1446    Education provided Yes   Education Details see pt instructions - pt given 1 yd red theraband   Person(s) Educated Patient;Child(ren)   Methods Explanation;Demonstration;Handout   Comprehension Verbalized understanding;Returned demonstration             PT Long Term Goals - 10/08/15 2109    PT LONG TERM GOAL #1   Title Pt will incr. Berg balance test score to >/= 33/56 to reduce fall risk. (11-07-15)   Baseline 27/56   Time 4   Period Weeks   Status New   PT LONG TERM GOAL #2   Title Amb. 300' with RW for incr. community accessibility. (11-07-15)   Baseline no device used - gait is unsafe placing pt at high fall risk  Time 4   Period Weeks   Status New   PT LONG TERM GOAL #3   Title Increase bil. LE strength so pt able to perform sit to stand from mat without UE support with SBA.  (11-07-15)   Time 4   Period Weeks   Status New   PT LONG TERM GOAL #4   Title Independent in HEP for balance and strengthening exs.  (11-07-15)   Time 4   Period Weeks   Status New               Plan - 12/07/15 1447    Clinical Impression Statement Pt continues to have significant quad weakness bil. LE's since seizure occurred on 11-11-15 - pt is at risk for fall due to LE weakness   Pt will benefit from skilled therapeutic intervention in order to improve on the following deficits Abnormal gait;Decreased balance;Decreased endurance;Decreased cognition;Decreased mobility;Decreased strength   Rehab Potential Good   PT Frequency 2x / week   PT Duration 4 weeks   PT  Treatment/Interventions ADLs/Self Care Home Management;DME Instruction;Gait training;Stair training;Functional mobility training;Therapeutic activities;Therapeutic exercise;Balance training;Neuromuscular re-education;Patient/family education   PT Next Visit Plan cont ther ex - check HEP for any ?'s or problems   PT Home Exercise Plan strengthening   Consulted and Agree with Plan of Care Patient;Family member/caregiver   Family Member Consulted daughter        Problem List Patient Active Problem List   Diagnosis Date Noted  . Abnormality of gait 10/04/2015  . Weakness 10/04/2015  . Memory loss 10/04/2015  . Acute encephalopathy 06/06/2015  . Mood disorder ([place]) 06/06/2015  . Diabetes mellitus type 2 in nonobese ([place]) 06/06/2015  . Diabetic neuropathy ([place]) 06/06/2015  . Dyslipidemia 06/06/2015  . Essential hypertension 06/06/2015    DildayJenness Corner, PT 12/07/2015, 2:50 PM  [facility] [address] [place], [place], [address] Phone: [phone]   Fax:  [phone]  Name: Tricia Huynh MRN: [id number] Date of Birth: January 08, 1956

## 2015-12-11 ENCOUNTER — Ambulatory Visit: Payer: Medicare Other | Admitting: Physical Therapy

## 2015-12-11 DIAGNOSIS — R29898 Other symptoms and signs involving the musculoskeletal system: Secondary | ICD-10-CM | POA: Diagnosis not present

## 2015-12-11 DIAGNOSIS — R269 Unspecified abnormalities of gait and mobility: Secondary | ICD-10-CM | POA: Diagnosis not present

## 2015-12-12 ENCOUNTER — Encounter: Payer: Self-pay | Admitting: Physical Therapy

## 2015-12-12 NOTE — Therapy (Signed)
[facility] [address] [place], [place], [address] Phone: [phone]   Fax:  [phone]  Physical Therapy Treatment  Patient Details  Name: Tricia Huynh MRN: [id number] Date of Birth: 22-Jun-1956 Referring Provider: Reyne Dumas, MD  Encounter Date: 12/11/2015      PT End of Session - 12/12/15 1100    Visit Number 8   Number of Visits 16   Date for PT Re-Evaluation 01/11/16   Authorization Type Medicare   Authorization Time Period 12-11-15 - 02-07-16   PT Start Time 1532   PT Stop Time 1621   PT Time Calculation (min) 49 min   Equipment Utilized During Treatment Gait belt      Past Medical History  Diagnosis Date  . Diabetes mellitus without complication ([place])   . Hypertension   . Mood disorder ([place]) 06/06/2015  . Essential hypertension 06/06/2015  . Dyslipidemia 06/06/2015  . Diabetic neuropathy ([place]) 06/06/2015  . Diabetes mellitus type 2 in nonobese ([place]) 06/06/2015  . Encephalopathy     Past Surgical History  Procedure Laterality Date  . Abdominal hysterectomy    . Tubal ligation    . Eye surgery Bilateral     There were no vitals filed for this visit.  Visit Diagnosis:  Leg weakness, bilateral  Abnormality of gait      Subjective Assessment - 12/12/15 1052    Subjective Pt states that her knees buckle when she gets tired - continues to use RW   Patient Stated Goals Improve balance and walking; increase LE strength   Currently in Pain? No/denies                         [facility] Adult PT Treatment/Exercise - 12/12/15 0001    Transfers   Transfers Sit to Stand   Sit to Stand 4: Min guard  UE support on RW upon standing   Ambulation/Gait   Ambulation/Gait Yes   Ambulation/Gait Assistance 4: Min assist   Ambulation/Gait Assistance Details knees will buckle with fatigue   Ambulation Distance (Feet) 250 Feet   Assistive device Rolling walker   Gait Pattern Decreased step  length - right;Decreased step length - left;Decreased stance time - left   Ambulation Surface Level;Indoor   Lumbar Exercises: Aerobic   Stationary Bike 6" Nustep level 2 with LE's only   Lumbar Exercises: Supine   Clam 10 reps  5# RLE, 3# LLE   Heel Slides 10 reps  2# bil. LE's   Bent Knee Raise 10 reps  2# weight each leg   Bridge 10 reps   Straight Leg Raise 10 reps   Lumbar Exercises: Sidelying   Hip Abduction 10 reps  3# weight on LLE, 2# on RLE   Lumbar Exercises: Prone   Straight Leg Raise 10 reps  with knee flexed with mod to min assist   Knee/Hip Exercises: Machines for Strengthening   Cybex Leg Press bil. LE's 40# 1 set 10, 45# bil. LE's 2 sets 10   Knee/Hip Exercises: Prone   Hamstring Curl 1 set;10 reps  3# LLE, 2# RLE 10 reps each     Tall kneeling - mod assist for squats in this position - sitting back toward heels and then assist to push up into tall  kneeling position for hip extensor strengthening - 10 reps                 PT Long Term Goals - 10/08/15 2109  PT LONG TERM GOAL #1   Title Pt will incr. Berg balance test score to >/= 33/56 to reduce fall risk. (11-07-15)   Baseline 27/56   Time 4   Period Weeks   Status New   PT LONG TERM GOAL #2   Title Amb. 300' with RW for incr. community accessibility. (11-07-15)   Baseline no device used - gait is unsafe placing pt at high fall risk   Time 4   Period Weeks   Status New   PT LONG TERM GOAL #3   Title Increase bil. LE strength so pt able to perform sit to stand from mat without UE support with SBA.  (11-07-15)   Time 4   Period Weeks   Status New   PT LONG TERM GOAL #4   Title Independent in HEP for balance and strengthening exs.  (11-07-15)   Time 4   Period Weeks   Status New               Plan - 12/12/15 1101    Clinical Impression Statement Pt improving slowly iwth increasing LE strength, but pt's knees cont to buckle with fatigue, demonstrating decr. muscle endurance    Pt will benefit from skilled therapeutic intervention in order to improve on the following deficits Abnormal gait;Decreased balance;Decreased endurance;Decreased cognition;Decreased mobility;Decreased strength   Rehab Potential Good   PT Frequency 2x / week   PT Duration 4 weeks   PT Treatment/Interventions ADLs/Self Care Home Management;DME Instruction;Gait training;Stair training;Functional mobility training;Therapeutic activities;Therapeutic exercise;Balance training;Neuromuscular re-education;Patient/family education   PT Next Visit Plan cont ther ex   PT Home Exercise Plan strengthening   Consulted and Agree with Plan of Care Patient;Family member/caregiver   Family Member Consulted daughter        Problem List Patient Active Problem List   Diagnosis Date Noted  . Abnormality of gait 10/04/2015  . Weakness 10/04/2015  . Memory loss 10/04/2015  . Acute encephalopathy 06/06/2015  . Mood disorder ([place]) 06/06/2015  . Diabetes mellitus type 2 in nonobese ([place]) 06/06/2015  . Diabetic neuropathy ([place]) 06/06/2015  . Dyslipidemia 06/06/2015  . Essential hypertension 06/06/2015    Alda Lea, PT 12/12/2015, 11:03 AM  [facility] [address] [place], [place], [address] Phone: [phone]   Fax:  [phone]  Name: Lakyn Neubeck MRN: [id number] Date of Birth: [birth year]

## 2015-12-12 NOTE — Addendum Note (Signed)
Addended by: Lamar Benes on: 12/12/2015 11:14 AM   Modules accepted: Orders

## 2015-12-13 ENCOUNTER — Ambulatory Visit: Payer: Medicare Other | Admitting: Physical Therapy

## 2015-12-13 DIAGNOSIS — R269 Unspecified abnormalities of gait and mobility: Secondary | ICD-10-CM

## 2015-12-13 DIAGNOSIS — R29898 Other symptoms and signs involving the musculoskeletal system: Secondary | ICD-10-CM

## 2015-12-14 ENCOUNTER — Encounter: Payer: Self-pay | Admitting: Physical Therapy

## 2015-12-14 NOTE — Therapy (Signed)
[facility] [address] [place], [place], [address] Phone: [phone]   Fax:  [phone]  Physical Therapy Treatment  Patient Details  Name: Tricia Huynh MRN: [id number] Date of Birth: 08/14/1956 Referring Provider: Reyne Dumas, MD  Encounter Date: 12/13/2015      PT End of Session - 12/14/15 1330    Visit Number 9   Number of Visits 16   Date for PT Re-Evaluation 01/11/16   Authorization Type Medicare   Authorization Time Period 12-11-15 - 02-07-16   PT Start Time 1440   PT Stop Time 1528   PT Time Calculation (min) 48 min      Past Medical History  Diagnosis Date  . Diabetes mellitus without complication ([place])   . Hypertension   . Mood disorder ([place]) 06/06/2015  . Essential hypertension 06/06/2015  . Dyslipidemia 06/06/2015  . Diabetic neuropathy ([place]) 06/06/2015  . Diabetes mellitus type 2 in nonobese ([place]) 06/06/2015  . Encephalopathy     Past Surgical History  Procedure Laterality Date  . Abdominal hysterectomy    . Tubal ligation    . Eye surgery Bilateral     There were no vitals filed for this visit.  Visit Diagnosis:  Leg weakness, bilateral  Abnormality of gait      Subjective Assessment - 12/14/15 1325    Subjective Pt states that her legs buckled when she got home from PT on Tuesday and she fell - needed assist. getting up   Patient is accompained by: Family member  daughter   Patient Stated Goals Improve balance and walking; increase LE strength   Currently in Pain? No/denies                         [facility] Adult PT Treatment/Exercise - 12/14/15 0001    Ambulation/Gait   Ambulation/Gait Yes   Ambulation/Gait Assistance 4: Min assist   Ambulation Distance (Feet) 240 Feet   Assistive device Rolling walker   Gait Pattern Decreased step length - right;Decreased step length - left;Decreased stance time - left   Ambulation Surface Level;Indoor   Lumbar Exercises:  Aerobic   Stationary Bike 5" Nustep level 2 with LE's only   Lumbar Exercises: Standing   Heel Raises 10 reps   Lumbar Exercises: Seated   Long Arc Quad on Chair Both;1 set;10 reps  2# weight   Lumbar Exercises: Supine   Clam 10 reps  5# RLE, 3# LLE   Heel Slides 10 reps  2# bil. LE's   Bent Knee Raise 10 reps  2# weight each leg   Bridge 10 reps   Straight Leg Raise 10 reps   Lumbar Exercises: Sidelying   Clam 10 reps   Hip Abduction 10 reps  3# weight on LLE, 2# on RLE   Knee/Hip Exercises: Machines for Strengthening   Cybex Leg Press bil. LE's 45# 3 sets 10 reps   Knee/Hip Exercises: Standing   Heel Raises Both;1 set;10 reps   Knee/Hip Exercises: Prone   Hamstring Curl 1 set;10 reps  3# LLE, 2# RLE 10 reps each   Hip Extension AAROM;Both;1 set;10 reps                     PT Long Term Goals - 12/12/15 1106    PT LONG TERM GOAL #1   Title Pt will incr. Berg balance test score to >/= 33/56 to reduce fall risk. (11-07-15)   Baseline pt is unable  to safely stand without UE support since seizure occurred on 11-10-16   Time 4   Period Weeks   Status On-going   PT LONG TERM GOAL #2   Title Amb. 300' with RW for incr. community accessibility. (11-07-15)   Baseline pt is now using a RW due to incr. LE weakness since seizure - needs min to SBA due to knees buckling iwth fatigue   Time 4   Period Weeks   Status On-going   PT LONG TERM GOAL #3   Title Increase bil. LE strength so pt able to perform sit to stand from mat without UE support with SBA.  (11-07-15)   Time 4   Period Weeks   Status On-going   PT LONG TERM GOAL #4   Title Independent in HEP for balance and strengthening exs.  (11-07-15)   Baseline met 12-11-15   Status Achieved               Plan - 12/14/15 1330    Clinical Impression Statement Knees did not buckle during treatment session today - pt performed leg press first to attempt preventing fatigue due to decr. muscle endurance   Pt  will benefit from skilled therapeutic intervention in order to improve on the following deficits Abnormal gait;Decreased balance;Decreased endurance;Decreased cognition;Decreased mobility;Decreased strength   Rehab Potential Good   PT Frequency 2x / week   PT Duration 4 weeks   PT Treatment/Interventions ADLs/Self Care Home Management;DME Instruction;Gait training;Stair training;Functional mobility training;Therapeutic activities;Therapeutic exercise;Balance training;Neuromuscular re-education;Patient/family education   PT Next Visit Plan cont ther ex   PT Home Exercise Plan strengthening   Consulted and Agree with Plan of Care Patient;Family member/caregiver   Family Member Consulted daughter        Problem List Patient Active Problem List   Diagnosis Date Noted  . Abnormality of gait 10/04/2015  . Weakness 10/04/2015  . Memory loss 10/04/2015  . Acute encephalopathy 06/06/2015  . Mood disorder ([place]) 06/06/2015  . Diabetes mellitus type 2 in nonobese ([place]) 06/06/2015  . Diabetic neuropathy ([place]) 06/06/2015  . Dyslipidemia 06/06/2015  . Essential hypertension 06/06/2015    Alda Lea, PT 12/14/2015, 1:32 PM  [facility] [address] [place], [place], [address] Phone: [phone]   Fax:  [phone]  Name: Mckay Brandt MRN: [id number] Date of Birth: 09-30-1956

## 2015-12-18 ENCOUNTER — Ambulatory Visit: Payer: Medicare Other | Admitting: Physical Therapy

## 2015-12-18 DIAGNOSIS — R269 Unspecified abnormalities of gait and mobility: Secondary | ICD-10-CM

## 2015-12-18 DIAGNOSIS — R29898 Other symptoms and signs involving the musculoskeletal system: Secondary | ICD-10-CM | POA: Diagnosis not present

## 2015-12-19 NOTE — Therapy (Signed)
[facility] [address] [place], [place], [address] Phone: [phone]   Fax:  [phone]  Physical Therapy Treatment  Patient Details  Name: Tricia Huynh MRN: [id number] Date of Birth: 29-May-1956 Referring Provider: Reyne Dumas, MD  Encounter Date: 12/18/2015      PT End of Session - 12/19/15 1556    Visit Number 10   Number of Visits 16   Date for PT Re-Evaluation 01/11/16   Authorization Type Medicare   Authorization Time Period 12-11-15 - 02-07-16   PT Start Time 1400   PT Stop Time 1445   PT Time Calculation (min) 45 min   Equipment Utilized During Treatment Gait belt      Past Medical History  Diagnosis Date  . Diabetes mellitus without complication ([place])   . Hypertension   . Mood disorder ([place]) 06/06/2015  . Essential hypertension 06/06/2015  . Dyslipidemia 06/06/2015  . Diabetic neuropathy ([place]) 06/06/2015  . Diabetes mellitus type 2 in nonobese ([place]) 06/06/2015  . Encephalopathy     Past Surgical History  Procedure Laterality Date  . Abdominal hysterectomy    . Tubal ligation    . Eye surgery Bilateral     There were no vitals filed for this visit.  Visit Diagnosis:  Leg weakness, bilateral  Abnormality of gait      Subjective Assessment - 12/19/15 1552    Subjective Pt reports no falls since last session but states she is feeling weak today   Patient is accompained by: Family member   Patient Stated Goals Improve balance and walking; increase LE strength   Currently in Pain? No/denies                         [facility] Adult PT Treatment/Exercise - 12/19/15 0001    Transfers   Transfers Sit to Stand   Sit to Stand 4: Min guard  UE support on RW upon standing   Five time sit to stand comments  with UE support prn   Number of Reps 10 reps   Ambulation/Gait   Ambulation/Gait Yes   Ambulation/Gait Assistance 4: Min assist   Ambulation Distance (Feet) 240 Feet   Assistive device Rolling walker   Gait Pattern Decreased step length - right;Decreased step length - left;Decreased stance time - left   Ambulation Surface Indoor   Stairs Yes   Stair Management Technique Two rails;Step to pattern;Forwards   Number of Stairs 4   Height of Stairs 6   Lumbar Exercises: Aerobic   Stationary Bike 5" Nustep level 2 with LE's only   Lumbar Exercises: Standing   Heel Raises 10 reps   Knee/Hip Exercises: Machines for Strengthening   Cybex Leg Press bil. LE's 45# 3 sets 10 reps   Knee/Hip Exercises: Standing   Heel Raises Both;1 set;10 reps   Forward Step Up Both;10 reps   Functional Squat 1 set;5 reps   Knee/Hip Exercises: Seated   Long Arc Quad Both;1 set;10 reps   Hamstring Curl Both;1 set;10 reps  with red theraband                     PT Long Term Goals - 12/12/15 1106    PT LONG TERM GOAL #1   Title Pt will incr. Berg balance test score to >/= 33/56 to reduce fall risk. (11-07-15)   Baseline pt is unable to safely stand without UE support since seizure occurred on 11-10-16   Time  4   Period Weeks   Status On-going   PT LONG TERM GOAL #2   Title Amb. 300' with RW for incr. community accessibility. (11-07-15)   Baseline pt is now using a RW due to incr. LE weakness since seizure - needs min to SBA due to knees buckling iwth fatigue   Time 4   Period Weeks   Status On-going   PT LONG TERM GOAL #3   Title Increase bil. LE strength so pt able to perform sit to stand from mat without UE support with SBA.  (11-07-15)   Time 4   Period Weeks   Status On-going   PT LONG TERM GOAL #4   Title Independent in HEP for balance and strengthening exs.  (11-07-15)   Baseline met 12-11-15   Status Achieved               Plan - 12/19/15 1557    Clinical Impression Statement Pt has had a setback in progress since seizure occurred on 11-11-15 resulting in increased weakness in LE's; pt now requires use of RW to assist with amb. to prevent  fall; pt is slowly progressing towards LTG's   Pt will benefit from skilled therapeutic intervention in order to improve on the following deficits Abnormal gait;Decreased balance;Decreased endurance;Decreased cognition;Decreased mobility;Decreased strength   Rehab Potential Good   PT Frequency 2x / week   PT Duration 4 weeks   PT Treatment/Interventions ADLs/Self Care Home Management;DME Instruction;Gait training;Stair training;Functional mobility training;Therapeutic activities;Therapeutic exercise;Balance training;Neuromuscular re-education;Patient/family education   PT Next Visit Plan cont ther ex   PT Home Exercise Plan strengthening   Consulted and Agree with Plan of Care Patient;Family member/caregiver   Family Member Consulted daughter          G-Codes - 01-14-16 1600    Functional Assessment Tool Used Pt now requires use of RW at all times to reduce fall risk due to knees buckling with fatigue   Functional Limitation Mobility: Walking and moving around   Mobility: Walking and Moving Around Current Status [phone]) At least 60 percent but less than 80 percent impaired, limited or restricted   Mobility: Walking and Moving Around Goal Status [phone]) At least 40 percent but less than 60 percent impaired, limited or restricted      Problem List Patient Active Problem List   Diagnosis Date Noted  . Abnormality of gait 10/04/2015  . Weakness 10/04/2015  . Memory loss 10/04/2015  . Acute encephalopathy 06/06/2015  . Mood disorder ([place]) 06/06/2015  . Diabetes mellitus type 2 in nonobese ([place]) 06/06/2015  . Diabetic neuropathy ([place]) 06/06/2015  . Dyslipidemia 06/06/2015  . Essential hypertension 06/06/2015    Physical Therapy Progress Note  Dates of Reporting Period:   10-08-15 to 01/14/2016  Objective Reports of Subjective Statement: Pt requires RW to prevent fall with amb. - knees buckle with fatigue due to decr. Muscle strength and endurance  Objective Measurements: Pt able to  amb. 250' with RW prior to fatigue; Berg test score 29/56; standing balance fluctuates depending on fatigue  Goal Update: NO LTG's have been achieved due to increased LE weakness since seizure on 11-12-15; pt is slowly progressing towards goals and strength is now improving  Plan: Gait training, therex, neuromuscular re-education, pt/family education, therapeutic activities  Reason Skilled Services are Required: Continued LE weakness and decr. Gait and balance; decr. Activity tolerance   Gayle Collard, [place], PT 12/19/2015, 4:03 PM  [facility] [address] [place],  [place], [address] Phone: [phone]   Fax:  [phone]  Name: Tricia Huynh MRN: [id number] Date of Birth: 29-Sep-1956

## 2015-12-20 ENCOUNTER — Ambulatory Visit: Payer: Medicare Other | Admitting: Physical Therapy

## 2015-12-25 ENCOUNTER — Encounter (HOSPITAL_COMMUNITY): Payer: Self-pay

## 2015-12-25 ENCOUNTER — Encounter: Payer: Self-pay | Admitting: Endocrinology

## 2015-12-25 ENCOUNTER — Emergency Department (HOSPITAL_COMMUNITY): Payer: Medicare Other

## 2015-12-25 ENCOUNTER — Ambulatory Visit: Payer: Medicare Other | Attending: Internal Medicine | Admitting: Physical Therapy

## 2015-12-25 ENCOUNTER — Emergency Department (HOSPITAL_COMMUNITY)
Admission: EM | Admit: 2015-12-25 | Discharge: 2015-12-26 | Disposition: A | Discharge status: Home / Self Care | Payer: Medicare Other | Attending: Emergency Medicine | Admitting: Emergency Medicine

## 2015-12-25 DIAGNOSIS — Z79899 Other long term (current) drug therapy: Secondary | ICD-10-CM | POA: Diagnosis not present

## 2015-12-25 DIAGNOSIS — N390 Urinary tract infection, site not specified: Secondary | ICD-10-CM | POA: Insufficient documentation

## 2015-12-25 DIAGNOSIS — R6883 Chills (without fever): Secondary | ICD-10-CM | POA: Diagnosis present

## 2015-12-25 DIAGNOSIS — E785 Hyperlipidemia, unspecified: Secondary | ICD-10-CM | POA: Diagnosis not present

## 2015-12-25 DIAGNOSIS — Z7982 Long term (current) use of aspirin: Secondary | ICD-10-CM | POA: Insufficient documentation

## 2015-12-25 DIAGNOSIS — Z87891 Personal history of nicotine dependence: Secondary | ICD-10-CM | POA: Diagnosis not present

## 2015-12-25 DIAGNOSIS — R29898 Other symptoms and signs involving the musculoskeletal system: Secondary | ICD-10-CM | POA: Insufficient documentation

## 2015-12-25 DIAGNOSIS — Z7984 Long term (current) use of oral hypoglycemic drugs: Secondary | ICD-10-CM | POA: Insufficient documentation

## 2015-12-25 DIAGNOSIS — F39 Unspecified mood [affective] disorder: Secondary | ICD-10-CM | POA: Insufficient documentation

## 2015-12-25 DIAGNOSIS — I10 Essential (primary) hypertension: Secondary | ICD-10-CM | POA: Insufficient documentation

## 2015-12-25 DIAGNOSIS — R509 Fever, unspecified: Secondary | ICD-10-CM

## 2015-12-25 DIAGNOSIS — R000 Tachycardia, unspecified: Secondary | ICD-10-CM | POA: Diagnosis not present

## 2015-12-25 DIAGNOSIS — R269 Unspecified abnormalities of gait and mobility: Secondary | ICD-10-CM | POA: Insufficient documentation

## 2015-12-25 DIAGNOSIS — E1140 Type 2 diabetes mellitus with diabetic neuropathy, unspecified: Secondary | ICD-10-CM | POA: Diagnosis not present

## 2015-12-25 LAB — CBC
HCT: 30.2 % — ABNORMAL LOW (ref 36.0–46.0)
Hemoglobin: 10.2 g/dL — ABNORMAL LOW (ref 12.0–15.0)
MCH: 29.3 pg (ref 26.0–34.0)
MCHC: 33.8 g/dL (ref 30.0–36.0)
MCV: 86.8 fL (ref 78.0–100.0)
Platelets: 231 10*3/uL (ref 150–400)
RBC: 3.48 MIL/uL — ABNORMAL LOW (ref 3.87–5.11)
RDW: 12.1 % (ref 11.5–15.5)
WBC: 4.1 10*3/uL (ref 4.0–10.5)

## 2015-12-25 LAB — COMPREHENSIVE METABOLIC PANEL
ALT: 17 U/L (ref 14–54)
AST: 21 U/L (ref 15–41)
Albumin: 3.7 g/dL (ref 3.5–5.0)
Alkaline Phosphatase: 136 U/L — ABNORMAL HIGH (ref 38–126)
Anion gap: 11 (ref 5–15)
BUN: 14 mg/dL (ref 6–20)
CO2: 27 mmol/L (ref 22–32)
Calcium: 9.6 mg/dL (ref 8.9–10.3)
Chloride: 98 mmol/L — ABNORMAL LOW (ref 101–111)
Creatinine, Ser: 1.07 mg/dL — ABNORMAL HIGH (ref 0.44–1.00)
GFR calc Af Amer: >60 mL/min (ref >60)
GFR calc non Af Amer: 56 mL/min — ABNORMAL LOW (ref >60)
Glucose, Bld: 274 mg/dL — ABNORMAL HIGH (ref 65–99)
Potassium: 4.1 mmol/L (ref 3.5–5.1)
Sodium: 136 mmol/L (ref 135–145)
Total Bilirubin: 0.2 mg/dL — ABNORMAL LOW (ref 0.3–1.2)
Total Protein: 7.6 g/dL (ref 6.5–8.1)

## 2015-12-25 LAB — I-STAT CG4 LACTIC ACID, ED
Lactic Acid, Venous: 1.52 mmol/L (ref 0.5–2.0)
Lactic Acid, Venous: 1.64 mmol/L (ref 0.5–2.0)

## 2015-12-25 MED ORDER — ACETAMINOPHEN 325 MG PO TABS
650.0000 mg | ORAL_TABLET | Freq: Once | ORAL | Status: AC
  Administered 2015-12-25: 650 mg via ORAL
  Filled 2015-12-25: qty 2

## 2015-12-25 NOTE — ED Provider Notes (Signed)
CSN: [id number]     Arrival date & time 12/25/15  1949 History  By signing my name below, I, Soijett Blue, attest that this documentation has been prepared under the direction and in the presence of Rolland Porter, MD at 2344. Electronically Signed: Soijett Blue, ED Scribe. 12/25/2015. 11:54 PM.   Chief Complaint  Patient presents with  . Chills     The history is provided by the patient. No language interpreter was used.    Tricia Huynh is a 60 y.o. female with a medical hx of DM, HTN, diabetic neuropathy, who presents to the Emergency Department complaining of chills onset 4 PM today. She notes that she went to her physical therapist today and then her symptoms began of chills and fever. She states that she has been going to see her physical therapist x 2 months to help with her balance.. She notes that she has sick contacts of her grandchildren who have URI like symptoms. She states that she did have the flu shot this year. Pt is having associated symptoms of dark spots on her skin on LE that are not draining. She states that she has the wounds to her bilateral LE due to her scratching the areas. She notes that she has not tried any medications for the relief of her symptoms. She denies rhinorrhea, sore throat, cough, nausea, diarrhea, dysuria, urinary frequency, abdominal pain, appetite change, and any other symptoms. She denies any pain anywhere. Denies smoking cigarettes or drinking alcohol. Pt is disabled at this time.   Pt PCP: Dr. Jonathon Jordan.    Past Medical History  Diagnosis Date  . Diabetes mellitus without complication ([place])   . Hypertension   . Mood disorder ([place]) 06/06/2015  . Essential hypertension 06/06/2015  . Dyslipidemia 06/06/2015  . Diabetic neuropathy ([place]) 06/06/2015  . Diabetes mellitus type 2 in nonobese ([place]) 06/06/2015  . Encephalopathy    Past Surgical History  Procedure Laterality Date  . Abdominal hysterectomy    . Tubal ligation    . Eye surgery Bilateral     Family History  Problem Relation Age of Onset  . Heart disease Father   . Kidney disease Father   . Diabetes Mother    Social History  Substance Use Topics  . Smoking status: Former Research scientist (life sciences)  . Smokeless tobacco: Never Used  . Alcohol Use: No   On disability  OB History    No data available     Review of Systems  Constitutional: Positive for fever and chills. Negative for appetite change.  HENT: Negative for rhinorrhea and sore throat.   Respiratory: Negative for cough.   Gastrointestinal: Negative for nausea, abdominal pain and diarrhea.  Genitourinary: Negative for dysuria and frequency.  All other systems reviewed and are negative.     Allergies  Percocet and Vicodin  Home Medications   Prior to Admission medications   Medication Sig Start Date End Date Taking? Authorizing Provider  amitriptyline (ELAVIL) 50 MG tablet Take 50 mg by mouth at bedtime. 05/08/15  Yes Historical Provider, MD  amLODipine-olmesartan (AZOR) 5-40 MG per tablet Take 1 tablet by mouth daily.   Yes Historical Provider, MD  aspirin-acetaminophen-caffeine (EXCEDRIN MIGRAINE) [id number] MG per tablet Take 2 tablets by mouth every 6 (six) hours as needed for headache.   Yes Historical Provider, MD  atorvastatin (LIPITOR) 40 MG tablet Take 40 mg by mouth daily. 05/10/15  Yes Historical Provider, MD  gabapentin (NEURONTIN) 600 MG tablet Take 600 mg by mouth 2 (two) times  daily.  10/08/14  Yes Historical Provider, MD  glipiZIDE (GLUCOTROL) 5 MG tablet Take 0.5 tablets (2.5 mg total) by mouth daily before breakfast. 06/07/15  Yes Reyne Dumas, MD  hydrochlorothiazide (HYDRODIURIL) 25 MG tablet Take 1 tablet (25 mg total) by mouth daily. 05/22/15  Yes Hannah Muthersbaugh, PA-C  levETIRAcetam (KEPPRA) 500 MG tablet One in the morning, 2 tabs at night 10/04/15  Yes Marcial Pacas, MD  metFORMIN (GLUCOPHAGE) 500 MG tablet Take 500 mg by mouth 2 (two) times daily. 10/09/14  Yes Historical Provider, MD  metoprolol  succinate (TOPROL-XL) 25 MG 24 hr tablet Take 50 mg by mouth at bedtime. 05/08/15  Yes Historical Provider, MD  NOVOLOG FLEXPEN 100 UNIT/ML FlexPen Inject 20-50 Units into the skin 2 (two) times daily. 50 units in am and 20 units in pm 10/08/14  Yes Historical Provider, MD  sucralfate (CARAFATE) 1 G tablet Take 1 tablet (1 g total) by mouth 4 (four) times daily -  with meals and at bedtime. 10/18/14  Yes Blanchie Dessert, MD  cephALEXin (KEFLEX) 500 MG capsule Take 1 capsule (500 mg total) by mouth 3 (three) times daily. 12/26/15   Rolland Porter, MD   ED Triage Vitals  Enc Vitals Group     BP 12/25/15 2000 144/101 mmHg     Pulse Rate 12/25/15 2000 131     Resp 12/25/15 2000 18     Temp 12/25/15 2000 102.5 F (39.2 C)     Temp Source 12/25/15 2000 Oral     SpO2 12/25/15 2000 100 %     Weight 12/25/15 2000 130 lb (58.968 kg)     Height 12/25/15 2000 5\' 7"  (1.702 m)     Head Cir --      Peak Flow --      Pain Score 12/25/15 2000 9     Pain Loc --      Pain Edu? --      Excl. in [place]? --    Vital signs normal except for fever and tachycardia   Physical Exam  Constitutional: She is oriented to person, place, and time. She appears well-developed and well-nourished.  Non-toxic appearance. She does not appear ill. No distress.  HENT:  Head: Normocephalic and atraumatic.  Right Ear: External ear normal.  Left Ear: External ear normal.  Nose: Nose normal. No mucosal edema or rhinorrhea.  Mouth/Throat: Oropharynx is clear and moist and mucous membranes are normal. No dental abscesses or uvula swelling.  Eyes: Conjunctivae and EOM are normal. Pupils are equal, round, and reactive to light.  Neck: Normal range of motion and full passive range of motion without pain. Neck supple.  Cardiovascular: Regular rhythm and normal heart sounds.  Tachycardia present.  Exam reveals no gallop and no friction rub.   No murmur heard. Pulmonary/Chest: Effort normal and breath sounds normal. No respiratory distress.  She has no wheezes. She has no rhonchi. She has no rales. She exhibits no tenderness and no crepitus.  Abdominal: Soft. Normal appearance and bowel sounds are normal. She exhibits no distension. There is no tenderness. There is no rebound and no guarding.  Genitourinary:  No CVA tenderness.  Musculoskeletal: Normal range of motion. She exhibits no edema or tenderness.  Moves all extremities well.   Neurological: She is alert and oriented to person, place, and time. She has normal strength. No cranial nerve deficit.  Skin: Skin is warm, dry and intact. No rash noted. No erythema. No pallor.  Patient has a few small hyperpigmented  areas on her anterior and medial thighs without signs of acute infection or abscess.  Psychiatric: She has a normal mood and affect. Her speech is normal and behavior is normal. Her mood appears not anxious.  Nursing note and vitals reviewed.   ED Course  Procedures (including critical care time)  Medications  acetaminophen (TYLENOL) tablet 650 mg (650 mg Oral Given 12/25/15 2006)  sodium chloride 0.9 % bolus 1,000 mL (0 mLs Intravenous Stopped 12/26/15 0130)  sodium chloride 0.9 % bolus 1,000 mL (0 mLs Intravenous Stopped 12/26/15 0344)  ketorolac (TORADOL) 30 MG/ML injection 30 mg (30 mg Intravenous Given 12/26/15 0055)  cefTRIAXone (ROCEPHIN) 1 g in dextrose 5 % 50 mL IVPB (0 g Intravenous Stopped 12/26/15 0438)    DIAGNOSTIC STUDIES: Oxygen Saturation is 100% on RA, nl by my interpretation.    COORDINATION OF CARE: 11:53 PM Discussed treatment plan with pt at bedside which includes labs, CXR, and pt agreed to plan. Patient's fever improved with Tylenol given to her triage however she still had a tachycardia. Even know she was not having vomiting she had an IV inserted and she received 2 L of normal saline. She was given IV Toradol.  After reviewing patient's laboratory results she was given 1 g of Rocephin IV for possible urinary tract infection or early urosepsis. At  time of discharge her heart rate was in the 92 range and she was no longer febrile. She states she feels fine. We discussed she needs to take the antibiotics for 10 days and she was advised on fever care specifically taking Motrin and Tylenol. She should return if she gets worse such as vomiting, abdominal pain, flank pain, or any worsening symptoms.    Results for orders placed or performed during the hospital encounter of 12/25/15  Comprehensive metabolic panel  Result Value Ref Range   Sodium 136 135 - 145 mmol/L   Potassium 4.1 3.5 - 5.1 mmol/L   Chloride 98 (L) 101 - 111 mmol/L   CO2 27 22 - 32 mmol/L   Glucose, Bld 274 (H) 65 - 99 mg/dL   BUN 14 6 - 20 mg/dL   Creatinine, Ser 1.07 (H) 0.44 - 1.00 mg/dL   Calcium 9.6 8.9 - 10.3 mg/dL   Total Protein 7.6 6.5 - 8.1 g/dL   Albumin 3.7 3.5 - 5.0 g/dL   AST 21 15 - 41 U/L   ALT 17 14 - 54 U/L   Alkaline Phosphatase 136 (H) 38 - 126 U/L   Total Bilirubin 0.2 (L) 0.3 - 1.2 mg/dL   GFR calc non Af Amer 56 (L) >60 mL/min   GFR calc Af Amer >60 >60 mL/min   Anion gap 11 5 - 15  CBC  Result Value Ref Range   WBC 4.1 4.0 - 10.5 K/uL   RBC 3.48 (L) 3.87 - 5.11 MIL/uL   Hemoglobin 10.2 (L) 12.0 - 15.0 g/dL   HCT 30.2 (L) 36.0 - 46.0 %   MCV 86.8 78.0 - 100.0 fL   MCH 29.3 26.0 - 34.0 pg   MCHC 33.8 30.0 - 36.0 g/dL   RDW 12.1 11.5 - 15.5 %   Platelets 231 150 - 400 K/uL  Urinalysis with microscopic (not at [facility])  Result Value Ref Range   Color, Urine YELLOW YELLOW   APPearance CLOUDY (A) CLEAR   Specific Gravity, Urine 1.015 1.005 - 1.030   pH 5.5 5.0 - 8.0   Glucose, UA 500 (A) NEGATIVE mg/dL   Hgb urine  dipstick NEGATIVE NEGATIVE   Bilirubin Urine NEGATIVE NEGATIVE   Ketones, ur NEGATIVE NEGATIVE mg/dL   Protein, ur NEGATIVE NEGATIVE mg/dL   Nitrite NEGATIVE NEGATIVE   Leukocytes, UA MODERATE (A) NEGATIVE   WBC, UA 6-30 0 - 5 WBC/hpf   RBC / HPF NONE SEEN 0 - 5 RBC/hpf   Bacteria, UA RARE (A) NONE SEEN   Squamous  Epithelial / LPF 0-5 (A) NONE SEEN   Casts HYALINE CASTS (A) NEGATIVE  I-Stat CG4 Lactic Acid, ED  (not at [facility])  Result Value Ref Range   Lactic Acid, Venous 1.64 0.5 - 2.0 mmol/L  I-Stat CG4 Lactic Acid, ED  (not at [facility])  Result Value Ref Range   Lactic Acid, Venous 1.52 0.5 - 2.0 mmol/L    Laboratory interpretation all normal except possible UTI, urine culture sent.   Dg Chest 2 View  12/25/2015  CLINICAL DATA:  Fever and chills beginning at 4 p.m. today. Initial encounter. EXAM: CHEST  2 VIEW COMPARISON:  PA and lateral chest 09/18/2015 and 06/02/2015. FINDINGS: The lungs are clear. Heart size is normal. There is no pneumothorax or pleural effusion. No focal bony abnormality. IMPRESSION: Negative chest. Electronically Signed   By: Inge Rise M.D.   On: 12/25/2015 20:53    I have personally reviewed and evaluated these images and lab results as part of my medical decision-making.    MDM   Final diagnoses:  Fever and chills  Urinary tract infection without hematuria, site unspecified   New Prescriptions   CEPHALEXIN (KEFLEX) 500 MG CAPSULE    Take 1 capsule (500 mg total) by mouth 3 (three) times daily.    Plan discharge  Rolland Porter, MD, FACEP   I personally performed the services described in this documentation, which was scribed in my presence. The recorded information has been reviewed and considered.  Rolland Porter, MD, Barbette Or, MD 12/26/15 [phone]

## 2015-12-25 NOTE — ED Notes (Signed)
Pt here with c/o cold chills and fever, onset today around4pm. Denies N/V/D.

## 2015-12-25 NOTE — ED Notes (Signed)
Pt reports she is not allergic to tylenol and is okay to take it.

## 2015-12-26 ENCOUNTER — Encounter: Payer: Self-pay | Admitting: Physical Therapy

## 2015-12-26 DIAGNOSIS — R509 Fever, unspecified: Secondary | ICD-10-CM | POA: Diagnosis not present

## 2015-12-26 LAB — URINALYSIS W MICROSCOPIC (NOT AT ARMC)
Bilirubin Urine: NEGATIVE
Glucose, UA: 500 mg/dL — AB
Hgb urine dipstick: NEGATIVE
Ketones, ur: NEGATIVE mg/dL
Nitrite: NEGATIVE
Protein, ur: NEGATIVE mg/dL
RBC / HPF: NONE SEEN RBC/hpf (ref 0–5)
Specific Gravity, Urine: 1.015 (ref 1.005–1.030)
pH: 5.5 (ref 5.0–8.0)

## 2015-12-26 MED ORDER — SODIUM CHLORIDE 0.9 % IV BOLUS (SEPSIS)
1000.0000 mL | Freq: Once | INTRAVENOUS | Status: AC
  Administered 2015-12-26: 1000 mL via INTRAVENOUS

## 2015-12-26 MED ORDER — DEXTROSE 5 % IV SOLN
1.0000 g | Freq: Once | INTRAVENOUS | Status: AC
  Administered 2015-12-26: 1 g via INTRAVENOUS
  Filled 2015-12-26: qty 10

## 2015-12-26 MED ORDER — CEPHALEXIN 500 MG PO CAPS: 500.0000 mg | ORAL_CAPSULE | Freq: Three times a day (TID) | ORAL | Status: DC

## 2015-12-26 MED ORDER — KETOROLAC TROMETHAMINE 30 MG/ML IJ SOLN
30.0000 mg | Freq: Once | INTRAMUSCULAR | Status: AC
  Administered 2015-12-26: 30 mg via INTRAVENOUS

## 2015-12-26 MED ORDER — KETOROLAC TROMETHAMINE 30 MG/ML IJ SOLN
INTRAMUSCULAR | Status: AC
  Filled 2015-12-26: qty 1

## 2015-12-26 NOTE — Therapy (Signed)
[facility] [address] [place], [place], [address] Phone: [phone]   Fax:  [phone]  Physical Therapy Treatment  Patient Details  Name: Tricia Huynh MRN: [id number] Date of Birth: 13-Jan-1956 Referring Provider: Reyne Dumas, MD  Encounter Date: 12/25/2015      PT End of Session - 12/26/15 1132    Visit Number 11   Number of Visits 16   Date for PT Re-Evaluation 01/11/16   Authorization Type Medicare - G code/progress report every 10th visit   Authorization Time Period 12-11-15 - 02-07-16   PT Start Time 1445   PT Stop Time 1531   PT Time Calculation (min) 46 min   Equipment Utilized During Treatment Gait belt      Past Medical History  Diagnosis Date  . Diabetes mellitus without complication ([place])   . Hypertension   . Mood disorder ([place]) 06/06/2015  . Essential hypertension 06/06/2015  . Dyslipidemia 06/06/2015  . Diabetic neuropathy ([place]) 06/06/2015  . Diabetes mellitus type 2 in nonobese ([place]) 06/06/2015  . Encephalopathy     Past Surgical History  Procedure Laterality Date  . Abdominal hysterectomy    . Tubal ligation    . Eye surgery Bilateral     There were no vitals filed for this visit.  Visit Diagnosis:  Leg weakness, bilateral  Abnormality of gait      Subjective Assessment - 12/26/15 1056    Subjective Pt states she had another seizure last Thursday am - now is really weak again; reason for cancellation last Thursday   Patient is accompained by: Family member   Patient Stated Goals Improve balance and walking; increase LE strength   Currently in Pain? No/denies                         [facility] Adult PT Treatment/Exercise - 12/26/15 0001    Transfers   Transfers Sit to Stand   Sit to Stand 4: Min guard   Five time sit to stand comments  with UE support prn   Stand to Sit 5: Supervision   Number of Reps 10 reps   Ambulation/Gait   Ambulation/Gait Yes   Ambulation/Gait Assistance 4: Min assist   Ambulation/Gait Assistance Details knee instability noted due to incr. weakness due to seizure   Ambulation Distance (Feet) 480 Feet   Assistive device Rolling walker   Gait Pattern Decreased step length - right;Decreased step length - left;Decreased stance time - left   Ambulation Surface Level;Indoor   Gait velocity 1.29  25.50 secs   Stairs Yes   Stairs Assistance 4: Min assist   Stair Management Technique Two rails;Forwards   Number of Stairs 4   Height of Stairs 6   Timed Up and Go Test   Normal TUG (seconds) 27.22  with RW   Lumbar Exercises: Aerobic   Stationary Bike 5" Nustep level 2 with LE's only   Lumbar Exercises: Machines for Strengthening   Leg Press 50# bil. LE's 3 sets 10 reps   Lumbar Exercises: Standing   Heel Raises 10 reps   Lumbar Exercises: Seated   Long Arc Quad on Chair Both;1 set;10 reps  with red theraband   Knee/Hip Exercises: Seated   Long Arc Quad Both;1 set;10 reps   Hamstring Curl Both;1 set;10 reps  with red theraband     Pt unable to stand unsupported due to bil. LE weakness  Gait velocity 25.50 secs =  1.29 ft/sec TUG  score 27.22 secs with RW              PT Long Term Goals - 12/26/15 1136    PT LONG TERM GOAL #1   Title Pt will incr. Berg balance test score to >/= 33/56 to reduce fall risk. (01-11-16)   Baseline pt is unable to safely stand without UE support since seizure occurred on 11-10-16   Time 4   Period Weeks   Status On-going   PT LONG TERM GOAL #2   Title Amb. 300' with RW for incr. community accessibility with SBA.  (01-11-16)   Baseline pt is now using a RW due to incr. LE weakness since seizure - needs min to SBA due to knees buckling with fatigue   Time 4   Period Weeks   Status On-going   PT LONG TERM GOAL #3   Title Increase bil. LE strength so pt able to perform sit to stand from mat without UE support with SBA.  (01-11-16)   Time 4   Period Weeks   Status  On-going   PT LONG TERM GOAL #4   Title Independent in HEP for balance and strengthening exs.  (11-07-15)   Baseline met 12-11-15   Status Achieved   PT LONG TERM GOAL #5   Title Increase gait velocity to >/= 1.6 ft/sec with RW for incr. gait efficiency,  (01-11-16)   Baseline 1.29 ft/sec on 12-25-15   Time 4   Period Weeks   Status New   Additional Long Term Goals   Additional Long Term Goals Yes   PT LONG TERM GOAL #6   Title Improve TUG score to </= 20 secs with RW with CGA for decr. fall risk.  (01-11-16)   Baseline 27.22 secs with RW on 12-25-15   Time 4   Period Weeks   Status New               Plan - 12/26/15 1133    Clinical Impression Statement Pt demonstrates decreased mobility with increased LE weakness due to seizure on 12-20-15; bil. knee instabiltiy continues to occur   Pt will benefit from skilled therapeutic intervention in order to improve on the following deficits Abnormal gait;Decreased balance;Decreased endurance;Decreased cognition;Decreased mobility;Decreased strength   Rehab Potential Good   PT Frequency 2x / week   PT Duration 4 weeks   PT Treatment/Interventions ADLs/Self Care Home Management;DME Instruction;Gait training;Stair training;Functional mobility training;Therapeutic activities;Therapeutic exercise;Balance training;Neuromuscular re-education;Patient/family education   PT Next Visit Plan cont ther ex   PT Home Exercise Plan strengthening   Consulted and Agree with Plan of Care Patient;Family member/caregiver   Family Member Consulted daughter        Problem List Patient Active Problem List   Diagnosis Date Noted  . Abnormality of gait 10/04/2015  . Weakness 10/04/2015  . Memory loss 10/04/2015  . Acute encephalopathy 06/06/2015  . Mood disorder ([place]) 06/06/2015  . Diabetes mellitus type 2 in nonobese ([place]) 06/06/2015  . Diabetic neuropathy ([place]) 06/06/2015  . Dyslipidemia 06/06/2015  . Essential hypertension 06/06/2015    Alda Lea, PT 12/26/2015, 11:41 AM  [facility] [address] [place], [place], [address] Phone: [phone]   Fax:  [phone]  Name: Tricia Huynh MRN: [id number] Date of Birth: [birth year]

## 2015-12-26 NOTE — Discharge Instructions (Signed)
Drink plenty of fluids, he will need extra fluids when you're having fever to prevent dehydration. Take ibuprofen 600 mg and/or acetaminophen 1000 mg every 6 hours as needed for fever, body aches and chills. Take the antibiotics until gone. Return to the emergency department if you feel worse such as getting nausea and vomiting, abdominal pain, flank pain, or persistent high fevers after the next 48-72 hours.    Fever, Adult A fever is an increase in the body's temperature. It is often defined as a temperature of 100 F (38C) or higher. Short mild or moderate fevers often have no long-term effects. They also often do not need treatment. Moderate or high fevers may make you feel uncomfortable. Sometimes, they can also be a sign of a serious illness or disease. The sweating that may happen with repeated fevers or fevers that last a while may also cause you to not have enough fluid in your body (dehydration). You can take your temperature with a thermometer to see if you have a fever. A measured temperature can change with:  Age.  Time of day.  Where the thermometer is placed:  Mouth (oral).  Rectum (rectal).  Ear (tympanic).  Underarm (axillary).  Forehead (temporal). HOME CARE Pay attention to any changes in your symptoms. Take these actions to help with your condition:  Take over-the-counter and prescription medicines only as told by your doctor. Follow the dosing instructions carefully.  If you were prescribed an antibiotic medicine, take it as told by your doctor. Do not stop taking the antibiotic even if you start to feel better.  Rest as needed.  Drink enough fluid to keep your pee (urine) clear or pale yellow.  Sponge yourself or bathe with room-temperature water as needed. This helps to lower your body temperature . Do not use ice water.  Do not wear too many blankets or heavy clothes. GET HELP IF:  You throw up (vomit).  You cannot eat or drink without throwing  up.  You have watery poop (diarrhea).  It hurts when you pee.  Your symptoms do not get better with treatment.  You have new symptoms.  You feel very weak. GET HELP RIGHT AWAY IF:  You are short of breath or have trouble breathing.  You are dizzy or you pass out (faint).  You feel confused.  You have signs of not having enough fluid in your body, such as:  A dry mouth.  Peeing less.  Looking pale.  You have very bad pain in your belly (abdomen).  You keep throwing up or having water poop.  You have a skin rash.  Your symptoms suddenly get worse.   This information is not intended to replace advice given to you by your health care provider. Make sure you discuss any questions you have with your health care provider.   Document Released: 08/12/2008 Document Revised: 07/25/2015 Document Reviewed: 12/28/2014 Elsevier Interactive Patient Education 2016 Else[employer] Inc.  Urinary Tract Infection A urinary tract infection (UTI) can occur any place along the urinary tract. The tract includes the kidneys, ureters, bladder, and urethra. A type of germ called bacteria often causes a UTI. UTIs are often helped with antibiotic medicine.  HOME CARE   If given, take antibiotics as told by your doctor. Finish them even if you start to feel better.  Drink enough fluids to keep your pee (urine) clear or pale yellow.  Avoid tea, drinks with caffeine, and bubbly (carbonated) drinks.  Pee often. Avoid holding your pee in  for a long time.  Pee before and after having sex (intercourse).  Wipe from front to back after you poop (bowel movement) if you are a woman. Use each tissue only once. GET HELP RIGHT AWAY IF:   You have back pain.  You have lower belly (abdominal) pain.  You have chills.  You feel sick to your stomach (nauseous).  You throw up (vomit).  Your burning or discomfort with peeing does not go away.  You have a fever.  Your symptoms are not better in 3  days. MAKE SURE YOU:   Understand these instructions.  Will watch your condition.  Will get help right away if you are not doing well or get worse.   This information is not intended to replace advice given to you by your health care provider. Make sure you discuss any questions you have with your health care provider.   Document Released: 04/21/2008 Document Revised: 11/24/2014 Document Reviewed: 06/03/2012 Elsevier Interactive Patient Education [employer].

## 2015-12-27 ENCOUNTER — Ambulatory Visit: Payer: Medicare Other | Admitting: Physical Therapy

## 2015-12-27 LAB — URINE CULTURE

## 2015-12-31 ENCOUNTER — Ambulatory Visit: Payer: Medicare Other | Admitting: *Deleted

## 2016-01-02 ENCOUNTER — Ambulatory Visit (INDEPENDENT_AMBULATORY_CARE_PROVIDER_SITE_OTHER): Payer: Medicare Other | Admitting: Endocrinology

## 2016-01-02 ENCOUNTER — Ambulatory Visit: Payer: Medicare Other | Admitting: Physical Therapy

## 2016-01-02 ENCOUNTER — Encounter: Payer: Self-pay | Admitting: Endocrinology

## 2016-01-02 VITALS — BP 132/86 | HR 103 | Temp 98.1°F | Ht 67.0 in | Wt 131.0 lb

## 2016-01-02 DIAGNOSIS — F32A Depression, unspecified: Secondary | ICD-10-CM

## 2016-01-02 DIAGNOSIS — F329 Major depressive disorder, single episode, unspecified: Secondary | ICD-10-CM

## 2016-01-02 DIAGNOSIS — R269 Unspecified abnormalities of gait and mobility: Secondary | ICD-10-CM | POA: Diagnosis not present

## 2016-01-02 DIAGNOSIS — R29898 Other symptoms and signs involving the musculoskeletal system: Secondary | ICD-10-CM

## 2016-01-02 MED ORDER — INSULIN GLARGINE 100 UNIT/ML SOLOSTAR PEN: 70.0000 [IU] | PEN_INJECTOR | SUBCUTANEOUS | Status: DC

## 2016-01-02 NOTE — Progress Notes (Signed)
Subjective:    Patient ID: Tricia Huynh, female    DOB: 26-Feb-1956, 60 y.o.   MRN: [id number]  HPI pt states DM was dx'ed in 2005; he has moderate neuropathy of the lower extremities; he is unaware of any associated chronic complications; he has been on insulin since 2010; pt says her diet is good, but exercise is limited by neurol problems; she has never had GDM, pancreatitis, severe hypoglycemia or DKA.  She takes BID premixed insulin.  She skips the insulin when cbg is below 100. This happens approx once per week, usually in am.  She takes the insulin without regard to meals.  She no longer takes novolog.   Past Medical History  Diagnosis Date  . Diabetes mellitus without complication ([place])   . Hypertension   . Mood disorder ([place]) 06/06/2015  . Essential hypertension 06/06/2015  . Dyslipidemia 06/06/2015  . Diabetic neuropathy ([place]) 06/06/2015  . Diabetes mellitus type 2 in nonobese ([place]) 06/06/2015  . Encephalopathy     Past Surgical History  Procedure Laterality Date  . Abdominal hysterectomy    . Tubal ligation    . Eye surgery Bilateral     Social History   Social History  . Marital Status: Single    Spouse Name: N/A  . Number of Children: 3  . Years of Education: 10   Occupational History  . Disabled    Social History Main Topics  . Smoking status: Former Research scientist (life sciences)  . Smokeless tobacco: Never Used  . Alcohol Use: No  . Drug Use: No  . Sexual Activity: Yes   Other Topics Concern  . Not on file   Social History Narrative   Lives at home with her grandchildren.   Right-handed.   No caffeine use.    Current Outpatient Prescriptions on File Prior to Visit  Medication Sig Dispense Refill  . amitriptyline (ELAVIL) 50 MG tablet Take 50 mg by mouth at bedtime.  1  . aspirin-acetaminophen-caffeine (EXCEDRIN MIGRAINE) [id number] MG per tablet Take 2 tablets by mouth every 6 (six) hours as needed for headache.    Marland Kitchen atorvastatin (LIPITOR) 40 MG tablet Take 40 mg by  mouth daily.  1  . cephALEXin (KEFLEX) 500 MG capsule Take 1 capsule (500 mg total) by mouth 3 (three) times daily. 30 capsule 0  . gabapentin (NEURONTIN) 600 MG tablet Take 600 mg by mouth 2 (two) times daily.   0  . hydrochlorothiazide (HYDRODIURIL) 25 MG tablet Take 1 tablet (25 mg total) by mouth daily. 30 tablet 0  . levETIRAcetam (KEPPRA) 500 MG tablet One in the morning, 2 tabs at night 90 tablet 11  . metoprolol succinate (TOPROL-XL) 25 MG 24 hr tablet Take 50 mg by mouth at bedtime.  1  . amLODipine-olmesartan (AZOR) 5-40 MG per tablet Take 1 tablet by mouth daily. Reported on 01/02/2016    . sucralfate (CARAFATE) 1 G tablet Take 1 tablet (1 g total) by mouth 4 (four) times daily -  with meals and at bedtime. (Patient not taking: Reported on 01/02/2016) 30 tablet 0   No current facility-administered medications on file prior to visit.    Allergies  Allergen Reactions  . Percocet [Oxycodone-Acetaminophen] Nausea And Vomiting and Other (See Comments)    Shaky/unsteady.  . Vicodin [Hydrocodone-Acetaminophen] Nausea And Vomiting and Other (See Comments)    Shaky/unsteady.    Family History  Problem Relation Age of Onset  . Heart disease Father   . Kidney disease Father   . Diabetes  Mother     BP 132/86 mmHg  Pulse 103  Temp(Src) 98.1 F (36.7 C) (Oral)  Ht 5\' 7"  (1.702 m)  Wt 131 lb (59.421 kg)  BMI 20.51 kg/m2  SpO2 97%  Review of Systems denies weight loss, blurry vision, headache, chest pain, sob, n/v, urinary frequency, memory loss, cold intolerance, and rhinorrhea.  She has leg cramps, easy bruising, and excessive diaphoresis.  She says cbg is higher in am than later in the day.     Objective:   Physical Exam VS: see vs page GEN: no distress HEAD: head: no deformity eyes: no periorbital swelling, no proptosis external nose and ears are normal mouth: no lesion seen NECK: supple, thyroid is not enlarged CHEST WALL: no deformity LUNGS:  Clear to auscultation CV:  reg rate and rhythm, no murmur ABD: abdomen is soft, nontender.  no hepatosplenomegaly.  not distended.  no hernia MUSCULOSKELETAL: muscle bulk and strength are grossly normal.  no obvious joint swelling.  gait is steady with a walker EXTEMITIES: no deformity.  no ulcer on the feet.  feet are of normal color and temp.  no edema PULSES: dorsalis pedis intact bilat.  no carotid bruit NEURO:  cn 2-12 grossly intact.   readily moves all 4's.  sensation is intact to touch on the feet, but decreased from normal SKIN:  Normal texture and temperature.  No rash or suspicious lesion is visible.   NODES:  None palpable at the neck.   PSYCH: alert, well-oriented.  Does not appear anxious nor depressed.  Lab Results  Component Value Date   HGBA1C 11.8 12/06/2015   I have reviewed outside records, and summarized: Pt was noted to have elevated a1c, and referred here.  i personally reviewed electrocardiogram tracing (11/11/15): Indication: AMS Impression: long QT     Assessment & Plan:  DM: severe exacerbation.  She needs a simpler insulin regimen.   Patient is advised the following: Patient Instructions  good diet and exercise significantly improve the control of your diabetes.  please let me know if you wish to be referred to a dietician.  high blood sugar is very risky to your health.  you should see an eye doctor and dentist every year.  It is very important to get all recommended vaccinations.  controlling your blood pressure and cholesterol drastically reduces the damage diabetes does to your body.  Those who smoke should quit.  please discuss these with your doctor.  check your blood sugar twice a day.  vary the time of day when you check, between before the 3 meals, and at bedtime.  also check if you have symptoms of your blood sugar being too high or too low.  please keep a record of the readings and bring it to your next appointment here (or you can bring the meter itself).  You can write it  on any piece of paper.  please call us sooner if your blood sugar goes below 70, or if you have a lot of readings over 200.   Please stop taking the glipizide and metformin, and: Change the novolog 70/30 to lantus, 80 units each morning.   Please come back for a follow-up appointment in 1 week.

## 2016-01-02 NOTE — Patient Instructions (Addendum)
good diet and exercise significantly improve the control of your diabetes.  please let me know if you wish to be referred to a dietician.  high blood sugar is very risky to your health.  you should see an eye doctor and dentist every year.  It is very important to get all recommended vaccinations.  controlling your blood pressure and cholesterol drastically reduces the damage diabetes does to your body.  Those who smoke should quit.  please discuss these with your doctor.  check your blood sugar twice a day.  vary the time of day when you check, between before the 3 meals, and at bedtime.  also check if you have symptoms of your blood sugar being too high or too low.  please keep a record of the readings and bring it to your next appointment here (or you can bring the meter itself).  You can write it on any piece of paper.  please call us sooner if your blood sugar goes below 70, or if you have a lot of readings over 200.   Please stop taking the glipizide and metformin, and: Change the novolog 70/30 to lantus, 80 units each morning.   Please come back for a follow-up appointment in 1 week.

## 2016-01-02 NOTE — Therapy (Signed)
[facility] [address] [place], [place], [address] Phone: [phone]   Fax:  [phone]  Physical Therapy Treatment  Patient Details  Name: Tricia Huynh MRN: [id number] Date of Birth: November 13, 1956 Referring Provider: Reyne Dumas, MD  Encounter Date: 01/02/2016      PT End of Session - 01/02/16 1433    Visit Number 12   Number of Visits 16   Date for PT Re-Evaluation 01/11/16   PT Start Time 1400   PT Stop Time 1438   PT Time Calculation (min) 38 min   Activity Tolerance Patient limited by fatigue   Behavior During Therapy [facility] for tasks assessed/performed      Past Medical History  Diagnosis Date  . Diabetes mellitus without complication ([place])   . Hypertension   . Mood disorder ([place]) 06/06/2015  . Essential hypertension 06/06/2015  . Dyslipidemia 06/06/2015  . Diabetic neuropathy ([place]) 06/06/2015  . Diabetes mellitus type 2 in nonobese ([place]) 06/06/2015  . Encephalopathy     Past Surgical History  Procedure Laterality Date  . Abdominal hysterectomy    . Tubal ligation    . Eye surgery Bilateral     There were no vitals filed for this visit.  Visit Diagnosis:  Leg weakness, bilateral  Abnormality of gait      Subjective Assessment - 01/02/16 1404    Subjective Had the flu last week; so feeling a little weak.  No new seizures.   Patient Stated Goals Improve balance and walking; increase LE strength   Currently in Pain? No/denies                         [facility] Adult PT Treatment/Exercise - 01/02/16 1405    Lumbar Exercises: Aerobic   Stationary Bike 8" Nustep level 2 with LE's only   Knee/Hip Exercises: Seated   Long Arc Quad Both;1 set;10 reps   Long Arc Quad Weight 1 lbs.   Marching Limitations x10 bil   Marching Weights 1 lbs.   Hamstring Curl Both;1 set;10 reps   Hamstring Limitations red theraband   Knee/Hip Exercises: Supine   Bridges Limitations x10   Straight Leg  Raises AROM;Both;1 set;10 reps   Straight Leg Raises Limitations 1#   Knee/Hip Exercises: Sidelying   Hip ABduction Both;10 reps;Strengthening   Hip ABduction Limitations 1# (LLE: x5 with 1#; c/o knee pain so removed weight and x5 without weight without pain)   Knee/Hip Exercises: Prone   Hamstring Curl 1 set;10 reps   Hamstring Curl Limitations bil; 1#   Hip Extension AAROM;Both;1 set;10 reps;AROM   Hip Extension Limitations focus on eccentric control; active on L                     PT Long Term Goals - 12/26/15 1136    PT LONG TERM GOAL #1   Title Pt will incr. Berg balance test score to >/= 33/56 to reduce fall risk. (01-11-16)   Baseline pt is unable to safely stand without UE support since seizure occurred on 11-10-16   Time 4   Period Weeks   Status On-going   PT LONG TERM GOAL #2   Title Amb. 300' with RW for incr. community accessibility with SBA.  (01-11-16)   Baseline pt is now using a RW due to incr. LE weakness since seizure - needs min to SBA due to knees buckling with fatigue   Time 4   Period Weeks  Status On-going   PT LONG TERM GOAL #3   Title Increase bil. LE strength so pt able to perform sit to stand from mat without UE support with SBA.  (01-11-16)   Time 4   Period Weeks   Status On-going   PT LONG TERM GOAL #4   Title Independent in HEP for balance and strengthening exs.  (11-07-15)   Baseline met 12-11-15   Status Achieved   PT LONG TERM GOAL #5   Title Increase gait velocity to >/= 1.6 ft/sec with RW for incr. gait efficiency,  (01-11-16)   Baseline 1.29 ft/sec on 12-25-15   Time 4   Period Weeks   Status New   Additional Long Term Goals   Additional Long Term Goals Yes   PT LONG TERM GOAL #6   Title Improve TUG score to </= 20 secs with RW with CGA for decr. fall risk.  (01-11-16)   Baseline 27.22 secs with RW on 12-25-15   Time 4   Period Weeks   Status New               Plan - 01/02/16 1433    Clinical Impression Statement  Pt tolerated mat level exercises well today; but reports fatigue and weakness following flu last week.  Pt motivated to work with PT despite fatigue.  Pt will continue to benefit from PT to maximize function.   PT Treatment/Interventions ADLs/Self Care Home Management;DME Instruction;Gait training;Stair training;Functional mobility training;Therapeutic activities;Therapeutic exercise;Balance training;Neuromuscular re-education;Patient/family education   PT Next Visit Plan cont ther ex/strengthening; balance activities as pt allows   PT Home Exercise Plan strengthening   Consulted and Agree with Plan of Care Patient;Family member/caregiver   Family Member Consulted daughter        Problem List Patient Active Problem List   Diagnosis Date Noted  . Abnormality of gait 10/04/2015  . Weakness 10/04/2015  . Memory loss 10/04/2015  . Acute encephalopathy 06/06/2015  . Mood disorder ([place]) 06/06/2015  . Diabetes mellitus type 2 in nonobese ([place]) 06/06/2015  . Diabetic neuropathy ([place]) 06/06/2015  . Dyslipidemia 06/06/2015  . Essential hypertension 06/06/2015   Laureen Abrahams, PT, DPT 01/02/2016 2:42 PM  [facility] [address] [place], [place], [address] Phone: [phone]   Fax:  [phone]  Name: Quandra Fedorchak MRN: [id number] Date of Birth: 1956-09-28

## 2016-01-07 ENCOUNTER — Ambulatory Visit: Payer: Medicare Other | Admitting: Physical Therapy

## 2016-01-08 ENCOUNTER — Ambulatory Visit (INDEPENDENT_AMBULATORY_CARE_PROVIDER_SITE_OTHER): Payer: Medicare Other | Admitting: Neurology

## 2016-01-08 ENCOUNTER — Ambulatory Visit (INDEPENDENT_AMBULATORY_CARE_PROVIDER_SITE_OTHER): Payer: Medicare Other | Admitting: Endocrinology

## 2016-01-08 ENCOUNTER — Encounter: Payer: Self-pay | Admitting: Endocrinology

## 2016-01-08 ENCOUNTER — Encounter: Payer: Medicare Other | Attending: Endocrinology | Admitting: Nutrition

## 2016-01-08 ENCOUNTER — Encounter: Payer: Self-pay | Admitting: Neurology

## 2016-01-08 VITALS — BP 124/82 | HR 72 | Ht 67.0 in | Wt 131.0 lb

## 2016-01-08 VITALS — BP 138/78 | HR 99 | Temp 98.1°F | Resp 14 | Ht 67.0 in | Wt 132.4 lb

## 2016-01-08 DIAGNOSIS — R413 Other amnesia: Secondary | ICD-10-CM | POA: Diagnosis not present

## 2016-01-08 DIAGNOSIS — E1142 Type 2 diabetes mellitus with diabetic polyneuropathy: Secondary | ICD-10-CM | POA: Diagnosis not present

## 2016-01-08 DIAGNOSIS — E119 Type 2 diabetes mellitus without complications: Secondary | ICD-10-CM | POA: Insufficient documentation

## 2016-01-08 DIAGNOSIS — Z794 Long term (current) use of insulin: Secondary | ICD-10-CM

## 2016-01-08 DIAGNOSIS — R269 Unspecified abnormalities of gait and mobility: Secondary | ICD-10-CM

## 2016-01-08 DIAGNOSIS — R569 Unspecified convulsions: Secondary | ICD-10-CM | POA: Insufficient documentation

## 2016-01-08 MED ORDER — GLUCOSE BLOOD VI STRP: 1.0000 | ORAL_STRIP | Freq: Two times a day (BID) | Status: DC

## 2016-01-08 MED ORDER — ACCU-CHEK AVIVA DEVI: Status: DC

## 2016-01-08 NOTE — Progress Notes (Signed)
Subjective:    Patient ID: Tricia Huynh, female    DOB: 11/17/1956, 60 y.o.   MRN: [id number]  HPI Pt returns for f/u of diabetes mellitus: DM type: Insulin-requiring type 2 Dx'ed: [id number] Complications: polyneuropathy Therapy: insulin since 2010 GDM: never DKA: never Severe hypoglycemia: never Pancreatitis: never Other: She takes QD insulin, due to noncompliance Interval history: She says her meter is broken. pt states she feels well in general. Past Medical History  Diagnosis Date  . Diabetes mellitus without complication ([place])   . Hypertension   . Mood disorder ([place]) 06/06/2015  . Essential hypertension 06/06/2015  . Dyslipidemia 06/06/2015  . Diabetic neuropathy ([place]) 06/06/2015  . Diabetes mellitus type 2 in nonobese ([place]) 06/06/2015  . Encephalopathy     Past Surgical History  Procedure Laterality Date  . Abdominal hysterectomy    . Tubal ligation    . Eye surgery Bilateral     Social History   Social History  . Marital Status: Single    Spouse Name: N/A  . Number of Children: 3  . Years of Education: 10   Occupational History  . Disabled    Social History Main Topics  . Smoking status: Former Research scientist (life sciences)  . Smokeless tobacco: Never Used  . Alcohol Use: No  . Drug Use: No  . Sexual Activity: Yes   Other Topics Concern  . Not on file   Social History Narrative   Lives at home with her grandchildren.   Right-handed.   No caffeine use.    Current Outpatient Prescriptions on File Prior to Visit  Medication Sig Dispense Refill  . amitriptyline (ELAVIL) 50 MG tablet Take 50 mg by mouth at bedtime.  1  . amLODipine-olmesartan (AZOR) 5-40 MG per tablet Take 1 tablet by mouth daily. Reported on 01/02/2016    . aspirin-acetaminophen-caffeine (EXCEDRIN MIGRAINE) 250-250-65 MG per tablet Take 2 tablets by mouth every 6 (six) hours as needed for headache.    Marland Kitchen atorvastatin (LIPITOR) 40 MG tablet Take 40 mg by mouth daily.  1  . gabapentin (NEURONTIN) 600 MG tablet  Take 600 mg by mouth 2 (two) times daily.   0  . hydrochlorothiazide (HYDRODIURIL) 25 MG tablet Take 1 tablet (25 mg total) by mouth daily. 30 tablet 0  . levETIRAcetam (KEPPRA) 500 MG tablet One in the morning, 2 tabs at night 90 tablet 11  . metFORMIN (GLUCOPHAGE) 500 MG tablet 2 (two) times daily.  0  . metoprolol succinate (TOPROL-XL) 25 MG 24 hr tablet Take 50 mg by mouth at bedtime.  1  . sucralfate (CARAFATE) 1 G tablet Take 1 tablet (1 g total) by mouth 4 (four) times daily -  with meals and at bedtime. 30 tablet 0   No current facility-administered medications on file prior to visit.    Allergies  Allergen Reactions  . Percocet [Oxycodone-Acetaminophen] Nausea And Vomiting and Other (See Comments)    Shaky/unsteady.  . Vicodin [Hydrocodone-Acetaminophen] Nausea And Vomiting and Other (See Comments)    Shaky/unsteady.    Family History  Problem Relation Age of Onset  . Heart disease Father   . Kidney disease Father   . Diabetes Mother     BP 138/78 mmHg  Pulse 99  Temp(Src) 98.1 F (36.7 C)  Resp 14  Ht 5\' 7"  (1.702 m)  Wt 132 lb 6.4 oz (60.056 kg)  BMI 20.73 kg/m2  SpO2 98%  Review of Systems She denies hypoglycemia    Objective:   Physical Exam VITAL SIGNS:  See vs page GENERAL: no distress SKIN:  Insulin injection sites at the anterior abdomen are normal     Assessment & Plan:  DM: therapy limited by noncompliance with cbg monitoring.  She may do better with a V-GO device.  Patient is advised the following: Patient Instructions  check your blood sugar twice a day.  vary the time of day when you check, between before the 3 meals, and at bedtime.  also check if you have symptoms of your blood sugar being too high or too low.  please keep a record of the readings and bring it to your next appointment here (or you can bring the meter itself).  You can write it on any piece of paper.  please call us sooner if your blood sugar goes below 70, or if you have a lot  of readings over 200.   Please start on a [id number], with no clicks for now.   Please call in 2-3 days, to tell us how the blood sugar is doing i have sent prescriptions to your pharmacy, for a new meter and strips.  On this type of insulin schedule, you should eat meals on a regular schedule.  If a meal is missed or significantly delayed, your blood sugar could go low.   Please come back for a follow-up appointment in 1 week.

## 2016-01-08 NOTE — Patient Instructions (Addendum)
check your blood sugar twice a day.  vary the time of day when you check, between before the 3 meals, and at bedtime.  also check if you have symptoms of your blood sugar being too high or too low.  please keep a record of the readings and bring it to your next appointment here (or you can bring the meter itself).  You can write it on any piece of paper.  please call us sooner if your blood sugar goes below 70, or if you have a lot of readings over 200.   Please start on a [id number], with no clicks for now.   Please call in 2-3 days, to tell us how the blood sugar is doing i have sent prescriptions to your pharmacy, for a new meter and strips.  On this type of insulin schedule, you should eat meals on a regular schedule.  If a meal is missed or significantly delayed, your blood sugar could go low.   Please come back for a follow-up appointment in 1 week.

## 2016-01-08 NOTE — Progress Notes (Signed)
Chief Complaint  Patient presents with  . Gait Problem    She is here with her daughter, Tricia Huynh.  They would like to review the results of her NCV/EMG.  . Seizures    Reports no further seizure activity.      PATIENT: Tricia Huynh DOB: 05-Feb-1956  Chief Complaint  Patient presents with  . Gait Problem    She is here with her daughter, Tricia Huynh.  They would like to review the results of her NCV/EMG.  . Seizures    Reports no further seizure activity.     HISTORICAL  Tricia Huynh is a 60 years old right-handed female, seen in refer by  her primary care physician Dr. Audley Hose for evaluation of memory trouble, seizure, she took scat transportation to office, alone at today's clinical visit in July 04 2015.  She had a history of insulin-dependent diabetes, hypertension, hyperlipidemia, lives with her granddaughters, she has been on disability due to her depression since 1998, had 10 years education, used to work at [employer],  She was admitted to the hospital from July 20 to 21 2016, was found down at home, confused, with bowel and bladder incontinence, she was seen by neuro hospitalists, abnormal EEG in July 2016,focal slowing over the occipital regions, left greater than right, Posterior-dominant photoepileptiform response, asymmetric more on the left occipital region, she was started on Keppra 500 mg twice a day, no recurrent seizure event  I have reviewed MRI of the brain without contrast of July 2016:1. No acute intracranial infarct or other abnormality identified. Mild age-related cerebral atrophy. 2.5 cm well-circumscribed ovoid lesion within the left occipital scalp. This lesion is indeterminate, but most certainly benign.  Laboratory evaluation, creatinine 1. 3, low TSH 0.2 CBC showed mild anemia hemoglobin 11 point 5, negative ammonia, UDS, A1c 11. 8,   UPDATE Oct 04 2015: She is with her granddaughter Tricia Huynh, She has one seizure in Oct 02 2015, she was not  able to elaborate on details. She lives with her granddaughter, she has gait difficulty.  She denies significant pain.  She has no bladder incontinence, she has mild constipation. 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  Her migraines has much improved  UPDATE Jan 08 2016: She is with her daughter at today's clinical visit, she was diagnosed with UTI, was put on antibiotic, has decreased appetite, generalized weakness, continue has gait difficulty, memory trouble,  We have personally reviewed her MRI in July 2016, generalized atrophy, especially along perisylvian fissure MRI of lumbar, mild degenerative changes, no significant foraminal or canal stenosis, EMG nerve conduction study January 2017 consistent with mild axonal peripheral neuropathy, consistent with her poorly controlled diabetes, most recent A1c was 11.9, she has a follow-up appointment with endocrinologist Dr. Hilliard Clark today  We reviewed laboratory evaluation, mild anemia 10 point 8,  She has no recurrent seizure, able to tolerate Keppra 500/1000 mg every night, she lives with her granddaughter, has home health nurse prepare her medications,  REVIEW OF SYSTEMS: Full 14 system review of systems performed and notable only for: Appetite change, eye discharge, eye itching, light sensitivity, restless leg, bruise easily, numbness, weakness, facial drooping, depression/anxiety  ALLERGIES: Allergies  Allergen Reactions  . Percocet [Oxycodone-Acetaminophen] Nausea And Vomiting  and Other (See Comments)    Shaky/unsteady.  . Vicodin [Hydrocodone-Acetaminophen] Nausea And Vomiting and Other (See Comments)    Shaky/unsteady.    HOME MEDICATIONS: Current Outpatient Prescriptions  Medication Sig Dispense  Refill  . amitriptyline (ELAVIL) 50 MG tablet Take 50 mg by mouth at bedtime.  1  . amLODipine-olmesartan (AZOR) 5-40 MG per tablet Take 1 tablet by mouth daily. Reported on 01/02/2016    . aspirin-acetaminophen-caffeine (EXCEDRIN MIGRAINE) 250-250-65 MG per tablet Take 2 tablets by mouth every 6 (six) hours as needed for headache.    Marland Kitchen atorvastatin (LIPITOR) 40 MG tablet Take 40 mg by mouth daily.  1  . cephALEXin (KEFLEX) 500 MG capsule Take 1 capsule (500 mg total) by mouth 3 (three) times daily. 30 capsule 0  . gabapentin (NEURONTIN) 600 MG tablet Take 600 mg by mouth 2 (two) times daily.   0  . hydrochlorothiazide (HYDRODIURIL) 25 MG tablet Take 1 tablet (25 mg total) by mouth daily. 30 tablet 0  . Insulin Glargine (LANTUS SOLOSTAR) 100 UNIT/ML Solostar Pen Inject 70 Units into the skin every morning. And pen needles 1/day 5 pen PRN  . levETIRAcetam (KEPPRA) 500 MG tablet One in the morning, 2 tabs at night 90 tablet 11  . metFORMIN (GLUCOPHAGE) 500 MG tablet 2 (two) times daily.  0  . metoprolol succinate (TOPROL-XL) 25 MG 24 hr tablet Take 50 mg by mouth at bedtime.  1  . sucralfate (CARAFATE) 1 G tablet Take 1 tablet (1 g total) by mouth 4 (four) times daily -  with meals and at bedtime. 30 tablet 0   No current facility-administered medications for this visit.    PAST MEDICAL HISTORY: Past Medical History  Diagnosis Date  . Diabetes mellitus without complication ([place])   . Hypertension   . Mood disorder ([place]) 06/06/2015  . Essential hypertension 06/06/2015  . Dyslipidemia 06/06/2015  . Diabetic neuropathy ([place]) 06/06/2015  . Diabetes mellitus type 2 in nonobese ([place]) 06/06/2015  . Encephalopathy     PAST SURGICAL HISTORY: Past Surgical History  Procedure Laterality Date  . Abdominal hysterectomy    . Tubal ligation    . Eye surgery Bilateral     FAMILY HISTORY: Family History  Problem Relation Age of Onset  . Heart disease Father   . Kidney disease Father   . Diabetes  Mother     SOCIAL HISTORY:  Social History   Social History  . Marital Status: Single    Spouse Name: N/A  . Number of Children: 3  . Years of Education: 10   Occupational History  . Disabled    Social History Main Topics  . Smoking status: Former Research scientist (life sciences)  . Smokeless tobacco: Never Used  . Alcohol Use: No  . Drug Use: No  . Sexual Activity: Yes   Other Topics Concern  . Not on file   Social History Narrative   Lives at home with her grandchildren.   Right-handed.   No caffeine use.    PHYSICAL EXAM   Filed Vitals:   01/08/16 1059  BP: 124/82  Pulse: 72  Height: 5\' 7"  (1.702 m)  Weight: 131 lb (59.421 kg)    Not recorded      Body mass index is 20.51 kg/(m^2).  PHYSICAL EXAMNIATION:  Gen: NAD, conversant, well nourised, obese, well groomed                     Cardiovascular: Regular rate rhythm, no peripheral edema, warm, nontender.  Eyes: Conjunctivae clear without exudates or hemorrhage Neck: Supple, no carotid bruise. Pulmonary: Clear to auscultation bilaterally   NEUROLOGICAL EXAM:  MENTAL STATUS: Speech:    Speech is normal; fluent and spontaneous with normal comprehension.  Cognition:Mini-Mental Status Examination is 23 out of 30, animal naming 12  She is not oriented to [place],  Recent and remote memory missed one out of 3 recalls  Attention span and concentration, has difficulties low world backwards  Normal Language, naming, repeating,spontaneous speech  Has difficulty copy figure   CRANIAL NERVES: CN II: Visual fields are full to confrontation. Fundoscopic exam is normal with sharp discs and no vascular changes. Pupils are round equal and briskly reactive to light. CN III, IV, VI: extraocular movement are normal. No ptosis. CN V: Facial sensation is intact to pinprick in all 3 divisions bilaterally. Corneal responses are intact.  CN VII: Face is symmetric with normal eye closure and smile. CN VIII: Hearing is normal to rubbing  fingers CN IX, X: Palate elevates symmetrically. Phonation is normal. CN XI: Head turning and shoulder shrug are intact CN XII: Tongue is midline with normal movements and no atrophy.  MOTOR: Normal tone and bulk, she has mild bilateral hip flexion, ankle dorsiflexion weakness,  REFLEXES: Areflexia  Sensory: She has length dependent decreased light touch pinprick vibratory sensation.   COORDINATION: Rapid alternating movements and fine finger movements are intact. There is no dysmetria on finger-to-nose and heel-knee-shin.    GAIT/STANCE: She need to push up to get up from seated position, cautious unsteady, could not stand up on tiptoe heels.    DIAGNOSTIC DATA (LABS, IMAGING, TESTING) - I reviewed patient records, labs, notes, testing and imaging myself where available.  ASSESSMENT AND PLAN  Tricia Huynh is a 60 y.o. female    Seizure  recurrent seizure on October 02 2015   Increase her Keppra to 500 mg in the morning/1000 mg at night your prescription was written   Memory loss   We have personally reviewed MRI of the brain, evidence of generalized atrophy, especially around perisylvian fissure  Central nervous system degenerative disorder, her mood disorder likely play a role as well  Gait difficulty, falling episode   Multifactorial diabetic peripheral neuropathy, deconditioning  Continue physical therapy   Diabetic peripheral neuropathy:  Her A1c was 11.7 in July 2016  Evidence of axonal peripheral neuropathy  Follow-up with endocrinologist today  Marcial Pacas, M.D. Ph.D.  [facility] [address], [place], [place] [address] Ph: [phone] Fax: [phone]  CC: Dr. Audley Hose

## 2016-01-09 ENCOUNTER — Telehealth: Payer: Self-pay | Admitting: Endocrinology

## 2016-01-09 NOTE — Telephone Encounter (Signed)
See note below and please advise, Thanks! 

## 2016-01-09 NOTE — Telephone Encounter (Signed)
Pt states that her pharmacy will not let the pt pick up vgo without [place] returning a form that they sent to [place] for have signed.

## 2016-01-09 NOTE — Telephone Encounter (Signed)
Patient told that we can not order the V-GOs until after she comes back in to see [place] for her 1 week appt.  She reported good understanding of this.

## 2016-01-10 ENCOUNTER — Ambulatory Visit: Payer: Medicare Other | Admitting: *Deleted

## 2016-01-14 ENCOUNTER — Ambulatory Visit: Payer: Medicare Other | Admitting: Physical Therapy

## 2016-01-16 NOTE — Patient Instructions (Addendum)
Fill and apply a new V-Go every day at the same time. Stop all other insulins Give one button press before every meal. Test blood sugars before meals and at bedtime, and call results on Friday Return in one week.

## 2016-01-16 NOTE — Progress Notes (Signed)
Tricia Huynh was instructed on how to fill apply and use the v-Go 30.  She did not take her insulin this AM, so she attached the V-go at this time.  She reported good understanding of this. She was given a starter kit with directions for use, and a telephone number if she had questions with how to fill or use the V-go.   She was reminded to stop all other insulins, and she reported good understanding of th She was reminded to test her blood sugars before meals and at bedtime and to call the results to [facility] on Friday.  She agreed to do this, and had no final questions.

## 2016-01-17 ENCOUNTER — Telehealth: Payer: Self-pay

## 2016-01-17 ENCOUNTER — Ambulatory Visit: Payer: Medicare Other | Admitting: Endocrinology

## 2016-01-17 ENCOUNTER — Ambulatory Visit: Payer: Medicare Other | Attending: Internal Medicine | Admitting: Physical Therapy

## 2016-01-17 ENCOUNTER — Telehealth: Payer: Self-pay | Admitting: Endocrinology

## 2016-01-17 DIAGNOSIS — R29898 Other symptoms and signs involving the musculoskeletal system: Secondary | ICD-10-CM | POA: Insufficient documentation

## 2016-01-17 DIAGNOSIS — R269 Unspecified abnormalities of gait and mobility: Secondary | ICD-10-CM | POA: Diagnosis not present

## 2016-01-17 MED ORDER — INSULIN ASPART 100 UNIT/ML ~~LOC~~ SOLN
SUBCUTANEOUS | Status: DC
Start: 2016-01-17 — End: 2016-01-18

## 2016-01-17 MED ORDER — V-GO 30 KIT: PACK | Status: DC

## 2016-01-17 NOTE — Telephone Encounter (Signed)
Pt had no transportation to get to the appt today and has the 745 slot tomorrow but she ran out of her med yesterday and has none to take. Please advise

## 2016-01-17 NOTE — Telephone Encounter (Signed)
We'll address at [facility] tomorrow i need PA form

## 2016-01-17 NOTE — Telephone Encounter (Signed)
Pt is out of the V-go 30 that was given to her at her last office visit. The V-go requires a PA through the pt's insurance. Please advise how to proceed. Pt has an office visit tomorrow.

## 2016-01-17 NOTE — Telephone Encounter (Signed)
I contacted the pt and she requested a refill on the V-Go 30 and Novolog. Rx refilled per pt's request and pt notified rx has been sent.

## 2016-01-18 ENCOUNTER — Ambulatory Visit: Payer: Medicare Other | Admitting: Endocrinology

## 2016-01-18 ENCOUNTER — Telehealth: Payer: Self-pay | Admitting: Endocrinology

## 2016-01-18 MED ORDER — INSULIN GLARGINE 100 UNIT/ML SOLOSTAR PEN: 80.0000 [IU] | PEN_INJECTOR | SUBCUTANEOUS | Status: DC

## 2016-01-18 NOTE — Telephone Encounter (Signed)
Pt said VGo was too expensive and wants to know what to do.

## 2016-01-18 NOTE — Telephone Encounter (Signed)
Pt's care taker advised of note below and voiced understanding.

## 2016-01-18 NOTE — Telephone Encounter (Signed)
Please go back to lantus, 80 units each morning. Please come back for a follow-up appointment in 2 weeks

## 2016-01-18 NOTE — Telephone Encounter (Signed)
PA placed on your desk to review.

## 2016-01-18 NOTE — Therapy (Signed)
[facility] [address] [place], [place], [address] Phone: [phone]   Fax:  [phone]  Physical Therapy Treatment  Patient Details  Name: Tricia Huynh MRN: [id number] Date of Birth: 1956/05/02 Referring Provider: Reyne Dumas, MD  Encounter Date: 01/17/2016      PT End of Session - 01/18/16 1441    Visit Number 13   Number of Visits 16   Date for PT Re-Evaluation 01/11/16   Authorization Type Medicare - G code/progress report every 10th visit   Authorization Time Period 12-11-15 - 02-07-16   PT Start Time 1315   PT Stop Time 1401   PT Time Calculation (min) 46 min      Past Medical History  Diagnosis Date  . Diabetes mellitus without complication ([place])   . Hypertension   . Mood disorder ([place]) 06/06/2015  . Essential hypertension 06/06/2015  . Dyslipidemia 06/06/2015  . Diabetic neuropathy ([place]) 06/06/2015  . Diabetes mellitus type 2 in nonobese ([place]) 06/06/2015  . Encephalopathy     Past Surgical History  Procedure Laterality Date  . Abdominal hysterectomy    . Tubal ligation    . Eye surgery Bilateral     There were no vitals filed for this visit.  Visit Diagnosis:  Leg weakness, bilateral  Abnormality of gait      Subjective Assessment - 01/18/16 1434    Subjective "I'm doing great since they changed my medicine" (Now has an insuling pump); Pt states she has not had to use the walker within past couple of weeks   Patient is accompained by: Family member   Patient Stated Goals Improve balance and walking; increase LE strength   Currently in Pain? No/denies            [facility] PT Assessment - 01/18/16 0001    Berg Balance Test   Sit to Stand Able to stand without using hands and stabilize independently   Standing Unsupported Able to stand safely 2 minutes   Sitting with Back Unsupported but Feet Supported on Floor or Stool Able to sit safely and securely 2 minutes   Stand to Sit Sits  safely with minimal use of hands   Transfers Able to transfer safely, minor use of hands   Standing Unsupported with Eyes Closed Able to stand 10 seconds safely   Standing Ubsupported with Feet Together Able to place feet together independently and stand 1 minute safely   From Standing, Reach Forward with Outstretched Arm Can reach confidently >25 cm (10")   From Standing Position, Pick up Object from Floor Able to pick up shoe safely and easily   From Standing Position, Turn to Look Behind Over each Shoulder Looks behind from both sides and weight shifts well   Turn 360 Degrees Able to turn 360 degrees safely in 4 seconds or less   Standing Unsupported, Alternately Place Feet on Step/Stool Able to stand independently and safely and complete 8 steps in 20 seconds   Standing Unsupported, One Foot in Front Able to place foot tandem independently and hold 30 seconds   Standing on One Leg Able to lift leg independently and hold 5-10 seconds  R= 8.31    L = 5.2   Total Score 55                     [facility] Adult PT Treatment/Exercise - 01/18/16 0001    Ambulation/Gait   Ambulation/Gait Yes   Ambulation/Gait Assistance 6: Modified independent (Device/Increase time)  Ambulation Distance (Feet) 360 Feet   Assistive device None   Gait Pattern Within Functional Limits   Ambulation Surface Level;Indoor   Gait velocity 3.67  8.94   Stairs Yes   Stairs Assistance 6: Modified independent (Device/Increase time)   Stair Management Technique Two rails;Step to pattern   Number of Stairs 4   Height of Stairs 6   Ramp 6: Modified independent (Device)   Curb 6: Modified independent (Device/increase time)   Timed Up and Go Test   Normal TUG (seconds) 10.03  no device   Lumbar Exercises: Aerobic   Stationary Bike 8" Nustep level 6 for 6", then level 4 for 2"   Lumbar Exercises: Machines for Strengthening   Leg Press 75# bil. LE's 3 sets 10 reps                     PT Long  Term Goals - 01/18/16 1443    PT LONG TERM GOAL #1   Title Pt will incr. Berg balance test score to >/= 33/56 to reduce fall risk. (01-11-16)   Baseline 55/56 on 27-Jan-2016   Status Achieved   PT LONG TERM GOAL #2   Title Amb. 300' with RW for incr. community accessibility with SBA.  (01-11-16)   Baseline No RW required due to LE strength is now WNL's - 27-Jan-2016   Status Achieved   PT LONG TERM GOAL #3   Title Increase bil. LE strength so pt able to perform sit to stand from mat without UE support with SBA.  (01-11-16)   Baseline met 01-27-2016   Status Achieved   PT LONG TERM GOAL #4   Title Independent in HEP for balance and strengthening exs.  (11-07-15)   Status Achieved   PT LONG TERM GOAL #5   Title Increase gait velocity to >/= 1.6 ft/sec with RW for incr. gait efficiency,  (01-11-16)   Baseline 8.94 secs with no device = 3.67 ft/sec on 2016-01-27   Status Achieved   PT LONG TERM GOAL #6   Title Improve TUG score to </= 20 secs with RW with CGA for decr. fall risk.  (01-11-16)   Baseline 10.03 secs with no device on 01-27-2016   Status Achieved               Plan - 01/18/16 1442    Clinical Impression Statement Pt has met all LTG's - Bil. LE strength has improved significantly with use of insulin pump; RW no longer needed - gait and balance are WNL's   Pt will benefit from skilled therapeutic intervention in order to improve on the following deficits Abnormal gait;Decreased balance;Decreased endurance;Decreased cognition;Decreased mobility;Decreased strength   Rehab Potential Good   PT Frequency 2x / week   PT Duration 4 weeks   PT Treatment/Interventions ADLs/Self Care Home Management;DME Instruction;Gait training;Stair training;Functional mobility training;Therapeutic activities;Therapeutic exercise;Balance training;Neuromuscular re-education;Patient/family education   PT Next Visit Plan D/C   PT Home Exercise Plan strengthening   Consulted and Agree with Plan of Care Patient;Family  member/caregiver   Family Member Consulted daughter          G-Codes - 01-27-16 1445    Functional Assessment Tool Used Pt is now independent with ambulation;  TUG score 10.03 secs; gait velocity 3.67 ft/sec without device; Berg score 55/56   Functional Limitation Mobility: Walking and moving around   Mobility: Walking and Moving Around Goal Status [phone]) At least 40 percent but less than 60 percent impaired, limited or restricted  Mobility: Walking and Moving Around Discharge Status [phone]) At least 1 percent but less than 20 percent impaired, limited or restricted      Problem List Patient Active Problem List   Diagnosis Date Noted  . Seizures ([place]) 01/08/2016  . Depression 01/03/2016  . Abnormality of gait 10/04/2015  . Weakness 10/04/2015  . Memory loss 10/04/2015  . Acute encephalopathy 06/06/2015  . Mood disorder ([place]) 06/06/2015  . Diabetes mellitus type 2 in nonobese ([place]) 06/06/2015  . Diabetic neuropathy ([place]) 06/06/2015  . Dyslipidemia 06/06/2015  . Essential hypertension 06/06/2015       PHYSICAL THERAPY DISCHARGE SUMMARY  Visits from Start of Care: 13  Current functional level related to goals / functional outcomes: See above for progress towards LTG's - all goals met; pt has made excellent progress which she attributes to use of newly prescribed insulin pump   Remaining deficits: Very minimally decreased high level balance skills but this is not affecting functional status; pt is now independent with ambulation with no assistive device required   Education / Equipment: Pt is independent with HEP for LE strengthening; pt reports she is walking as much as possible for exercise program at home Plan: Patient agrees to discharge.  Patient goals were met. Patient is being discharged due to meeting the stated rehab goals.  ?????       Alda Lea, PT 01/18/2016, 2:50 PM  [facility] [address] [place], [place], [address] Phone: [phone]   Fax:  [phone]  Name: Tricia Huynh MRN: [id number] Date of Birth: Dec 10, 1955

## 2016-01-18 NOTE — Telephone Encounter (Signed)
See note below and please advise on how to proceed. Thanks!

## 2016-01-21 ENCOUNTER — Ambulatory Visit: Payer: Medicare Other | Admitting: Physical Therapy

## 2016-01-21 ENCOUNTER — Ambulatory Visit (INDEPENDENT_AMBULATORY_CARE_PROVIDER_SITE_OTHER): Payer: Medicare Other | Admitting: Endocrinology

## 2016-01-21 ENCOUNTER — Encounter: Payer: Self-pay | Admitting: Endocrinology

## 2016-01-21 VITALS — BP 168/104 | HR 119 | Temp 98.3°F | Ht 67.0 in | Wt 133.0 lb

## 2016-01-21 DIAGNOSIS — Z794 Long term (current) use of insulin: Secondary | ICD-10-CM | POA: Diagnosis not present

## 2016-01-21 DIAGNOSIS — N183 Chronic kidney disease, stage 3 unspecified: Secondary | ICD-10-CM

## 2016-01-21 DIAGNOSIS — E1122 Type 2 diabetes mellitus with diabetic chronic kidney disease: Secondary | ICD-10-CM

## 2016-01-21 DIAGNOSIS — E119 Type 2 diabetes mellitus without complications: Secondary | ICD-10-CM

## 2016-01-21 NOTE — Patient Instructions (Addendum)
check your blood sugar twice a day.  vary the time of day when you check, between before the 3 meals, and at bedtime.  also check if you have symptoms of your blood sugar being too high or too low.  please keep a record of the readings and bring it to your next appointment here (or you can bring the meter itself).  You can write it on any piece of paper.  please call us sooner if your blood sugar goes below 70, or if you have a lot of readings over 200.   Continue lantus, 80 units each morning.   On this type of insulin schedule, you should eat meals on a regular schedule.  If a meal is missed or significantly delayed, your blood sugar could go low.  Please come back for a follow-up appointment in 2 months.

## 2016-01-21 NOTE — Progress Notes (Signed)
Subjective:    Patient ID: Tricia Huynh, female    DOB: 1956-09-17, 60 y.o.   MRN: [id number]  HPI Pt returns for f/u of diabetes mellitus: DM type: Insulin-requiring type 2 Dx'ed: [id number] Complications: polyneuropathy Therapy: insulin since 2010.   GDM: never DKA: never Severe hypoglycemia: never.   Pancreatitis: never Other: She takes QD insulin, due to noncompliance; insurance declined V-GO.   Interval history: pt states she feels well in general.  no cbg record, but states cbg's vary from 92-200.  It is lowest in the afternoon, especially when she misses lunch.   Past Medical History  Diagnosis Date  . Diabetes mellitus without complication ([place])   . Hypertension   . Mood disorder ([place]) 06/06/2015  . Essential hypertension 06/06/2015  . Dyslipidemia 06/06/2015  . Diabetic neuropathy ([place]) 06/06/2015  . Diabetes mellitus type 2 in nonobese ([place]) 06/06/2015  . Encephalopathy     Past Surgical History  Procedure Laterality Date  . Abdominal hysterectomy    . Tubal ligation    . Eye surgery Bilateral     Social History   Social History  . Marital Status: Single    Spouse Name: N/A  . Number of Children: 3  . Years of Education: 10   Occupational History  . Disabled    Social History Main Topics  . Smoking status: Former Research scientist (life sciences)  . Smokeless tobacco: Never Used  . Alcohol Use: No  . Drug Use: No  . Sexual Activity: Yes   Other Topics Concern  . Not on file   Social History Narrative   Lives at home with her grandchildren.   Right-handed.   No caffeine use.    Current Outpatient Prescriptions on File Prior to Visit  Medication Sig Dispense Refill  . amitriptyline (ELAVIL) 50 MG tablet Take 50 mg by mouth at bedtime.  1  . amLODipine-olmesartan (AZOR) 5-40 MG per tablet Take 1 tablet by mouth daily. Reported on 01/02/2016    . aspirin-acetaminophen-caffeine (EXCEDRIN MIGRAINE) 250-250-65 MG per tablet Take 2 tablets by mouth every 6 (six) hours as needed for  headache.    Marland Kitchen atorvastatin (LIPITOR) 40 MG tablet Take 40 mg by mouth daily.  1  . Blood Glucose Monitoring Suppl (ACCU-CHEK AVIVA) device Use as instructed 1 each 0  . gabapentin (NEURONTIN) 600 MG tablet Take 600 mg by mouth 2 (two) times daily.   0  . glucose blood (ACCU-CHEK AVIVA) test strip 1 each by Other route 2 (two) times daily. And lancets 2/day 100 each 12  . hydrochlorothiazide (HYDRODIURIL) 25 MG tablet Take 1 tablet (25 mg total) by mouth daily. 30 tablet 0  . Insulin Glargine (LANTUS SOLOSTAR) 100 UNIT/ML Solostar Pen Inject 80 Units into the skin every morning. 10 pen PRN  . levETIRAcetam (KEPPRA) 500 MG tablet One in the morning, 2 tabs at night 90 tablet 11  . metoprolol succinate (TOPROL-XL) 25 MG 24 hr tablet Take 50 mg by mouth at bedtime.  1  . sucralfate (CARAFATE) 1 G tablet Take 1 tablet (1 g total) by mouth 4 (four) times daily -  with meals and at bedtime. 30 tablet 0   No current facility-administered medications on file prior to visit.    Allergies  Allergen Reactions  . Percocet [Oxycodone-Acetaminophen] Nausea And Vomiting and Other (See Comments)    Shaky/unsteady.  . Vicodin [Hydrocodone-Acetaminophen] Nausea And Vomiting and Other (See Comments)    Shaky/unsteady.    Family History  Problem Relation Age of Onset  .  Heart disease Father   . Kidney disease Father   . Diabetes Mother     BP 168/104 mmHg  Pulse 119  Temp(Src) 98.3 F (36.8 C) (Oral)  Ht 5\' 7"  (1.702 m)  Wt 133 lb (60.328 kg)  BMI 20.83 kg/m2  SpO2 99%  Review of Systems She denies hypoglycemia    Objective:   Physical Exam VITAL SIGNS:  See vs page GENERAL: no distress. SKIN:  Insulin injection sites at the anterior abdomen are normal   Lab Results  Component Value Date   HGBA1C 11.8 12/06/2015       Assessment & Plan:  DM: glycemic control is much better.  Patient is advised the following: Patient Instructions  check your blood sugar twice a day.  vary the  time of day when you check, between before the 3 meals, and at bedtime.  also check if you have symptoms of your blood sugar being too high or too low.  please keep a record of the readings and bring it to your next appointment here (or you can bring the meter itself).  You can write it on any piece of paper.  please call us sooner if your blood sugar goes below 70, or if you have a lot of readings over 200.   Continue lantus, 80 units each morning.   On this type of insulin schedule, you should eat meals on a regular schedule.  If a meal is missed or significantly delayed, your blood sugar could go low.  Please come back for a follow-up appointment in 2 months.

## 2016-01-24 ENCOUNTER — Ambulatory Visit: Payer: Medicare Other | Admitting: Physical Therapy

## 2016-01-31 DIAGNOSIS — E1142 Type 2 diabetes mellitus with diabetic polyneuropathy: Secondary | ICD-10-CM | POA: Diagnosis not present

## 2016-01-31 DIAGNOSIS — I129 Hypertensive chronic kidney disease with stage 1 through stage 4 chronic kidney disease, or unspecified chronic kidney disease: Secondary | ICD-10-CM | POA: Diagnosis not present

## 2016-01-31 DIAGNOSIS — G40909 Epilepsy, unspecified, not intractable, without status epilepticus: Secondary | ICD-10-CM | POA: Diagnosis not present

## 2016-01-31 DIAGNOSIS — Z794 Long term (current) use of insulin: Secondary | ICD-10-CM | POA: Diagnosis not present

## 2016-01-31 DIAGNOSIS — E1122 Type 2 diabetes mellitus with diabetic chronic kidney disease: Secondary | ICD-10-CM | POA: Diagnosis not present

## 2016-02-04 ENCOUNTER — Encounter (HOSPITAL_COMMUNITY): Payer: Self-pay | Admitting: Emergency Medicine

## 2016-02-04 ENCOUNTER — Emergency Department (HOSPITAL_COMMUNITY)
Admission: EM | Admit: 2016-02-04 | Discharge: 2016-02-04 | Disposition: A | Discharge status: Home / Self Care | Payer: Medicare Other | Attending: Emergency Medicine | Admitting: Emergency Medicine

## 2016-02-04 DIAGNOSIS — Z87891 Personal history of nicotine dependence: Secondary | ICD-10-CM | POA: Diagnosis not present

## 2016-02-04 DIAGNOSIS — R000 Tachycardia, unspecified: Secondary | ICD-10-CM | POA: Insufficient documentation

## 2016-02-04 DIAGNOSIS — R55 Syncope and collapse: Secondary | ICD-10-CM | POA: Insufficient documentation

## 2016-02-04 DIAGNOSIS — Z79899 Other long term (current) drug therapy: Secondary | ICD-10-CM | POA: Insufficient documentation

## 2016-02-04 DIAGNOSIS — G40909 Epilepsy, unspecified, not intractable, without status epilepticus: Secondary | ICD-10-CM | POA: Diagnosis not present

## 2016-02-04 DIAGNOSIS — R404 Transient alteration of awareness: Secondary | ICD-10-CM | POA: Diagnosis not present

## 2016-02-04 DIAGNOSIS — E785 Hyperlipidemia, unspecified: Secondary | ICD-10-CM | POA: Insufficient documentation

## 2016-02-04 DIAGNOSIS — I10 Essential (primary) hypertension: Secondary | ICD-10-CM | POA: Diagnosis not present

## 2016-02-04 DIAGNOSIS — E1140 Type 2 diabetes mellitus with diabetic neuropathy, unspecified: Secondary | ICD-10-CM | POA: Insufficient documentation

## 2016-02-04 DIAGNOSIS — R569 Unspecified convulsions: Secondary | ICD-10-CM

## 2016-02-04 HISTORY — DX: Unspecified convulsions: R56.9

## 2016-02-04 MED ORDER — LEVETIRACETAM 750 MG PO TABS
1500.0000 mg | ORAL_TABLET | Freq: Once | ORAL | Status: AC
  Administered 2016-02-04: 1500 mg via ORAL
  Filled 2016-02-04: qty 2

## 2016-02-04 NOTE — ED Provider Notes (Addendum)
CSN: [id number]     Arrival date & time 02/04/16  1245 History   First MD Initiated Contact with Patient 02/04/16 1301     Chief Complaint  Patient presents with  . Loss of Consciousness  . Seizures     (Consider location/radiation/quality/duration/timing/severity/associated sxs/prior Treatment) Patient is a 60 y.o. female presenting with syncope and seizures. The history is provided by the patient and a relative.  Loss of Consciousness Episode history:  Single Most recent episode:  Today Duration: unsure. Timing:  Rare Progression:  Resolved Chronicity:  Recurrent Witnessed: yes (at the end of the episode)   Relieved by:  Nothing Worsened by:  Nothing tried Ineffective treatments:  None tried Associated symptoms: seizures   Associated symptoms: no chest pain, no dizziness, no fever, no headaches, no nausea, no palpitations, no shortness of breath and no vomiting   Seizures  60 yo F with a chief complaint of a seizure. Per the family these all her leaning against a wall and slowly lowers up to the ground. They didn't notice any shaking of the time but felt that she may have shook before that. Had a prolonged post ictal.. When EMS arrived she was completely unresponsive. Had improvement en route to the ED. Has a history of seizures is on Keppra sees Dr. [name].  They deny any head injuries. Denies any noncompliance.   Past Medical History  Diagnosis Date  . Diabetes mellitus without complication ([place])   . Hypertension   . Mood disorder ([place]) 06/06/2015  . Essential hypertension 06/06/2015  . Dyslipidemia 06/06/2015  . Diabetic neuropathy ([place]) 06/06/2015  . Diabetes mellitus type 2 in nonobese ([place]) 06/06/2015  . Encephalopathy   . Seizure [facility])     sees [name]. abd eeg, on keppra   Past Surgical History  Procedure Laterality Date  . Abdominal hysterectomy    . Tubal ligation    . Eye surgery Bilateral    Family History  Problem Relation Age of Onset  . Heart disease Father    . Kidney disease Father   . Diabetes Mother    Social History  Substance Use Topics  . Smoking status: Former Research scientist (life sciences)  . Smokeless tobacco: Never Used  . Alcohol Use: No   OB History    No data available     Review of Systems  Constitutional: Negative for fever and chills.  HENT: Negative for congestion and rhinorrhea.   Eyes: Negative for redness and visual disturbance.  Respiratory: Negative for shortness of breath and wheezing.   Cardiovascular: Positive for syncope. Negative for chest pain and palpitations.  Gastrointestinal: Negative for nausea and vomiting.  Genitourinary: Negative for dysuria and urgency.  Musculoskeletal: Negative for myalgias and arthralgias.  Skin: Negative for pallor and wound.  Neurological: Positive for seizures. Negative for dizziness and headaches.      Allergies  Percocet and Vicodin  Home Medications   Prior to Admission medications   Medication Sig Start Date End Date Taking? Authorizing Provider  amitriptyline (ELAVIL) 50 MG tablet Take 50 mg by mouth at bedtime. 05/08/15  Yes Historical Provider, MD  amLODipine-olmesartan (AZOR) 5-40 MG per tablet Take 1 tablet by mouth daily. Reported on 01/02/2016   Yes Historical Provider, MD  atorvastatin (LIPITOR) 40 MG tablet Take 40 mg by mouth daily. 05/10/15  Yes Historical Provider, MD  Blood Glucose Monitoring Suppl (ACCU-CHEK AVIVA) device Use as instructed 01/08/16 01/07/17 Yes Renato Shin, MD  gabapentin (NEURONTIN) 600 MG tablet Take 600 mg by mouth 2 (two)  times daily.  10/08/14  Yes Historical Provider, MD  glucose blood (ACCU-CHEK AVIVA) test strip 1 each by Other route 2 (two) times daily. And lancets 2/day 01/08/16  Yes Renato Shin, MD  hydrochlorothiazide (HYDRODIURIL) 25 MG tablet Take 1 tablet (25 mg total) by mouth daily. 05/22/15  Yes Hannah Muthersbaugh, PA-C  Insulin Glargine (LANTUS SOLOSTAR) 100 UNIT/ML Solostar Pen Inject 80 Units into the skin every morning. 01/18/16  Yes Renato Shin, MD  levETIRAcetam (KEPPRA) 500 MG tablet One in the morning, 2 tabs at night Patient taking differently: Take 1,000 mg by mouth at bedtime.  10/04/15  Yes Marcial Pacas, MD  metoprolol succinate (TOPROL-XL) 25 MG 24 hr tablet Take 50 mg by mouth at bedtime. 05/08/15  Yes Historical Provider, MD  sucralfate (CARAFATE) 1 G tablet Take 1 tablet (1 g total) by mouth 4 (four) times daily -  with meals and at bedtime. 10/18/14  Yes Blanchie Dessert, MD   BP 162/112 mmHg  Pulse 108  Temp(Src) 98.3 F (36.8 C) (Oral)  Resp 14  Ht 5\' 7"  (1.702 m)  Wt 133 lb (60.328 kg)  BMI 20.83 kg/m2  SpO2 100% Physical Exam  Constitutional: She is oriented to person, place, and time. She appears well-developed and well-nourished. No distress.  HENT:  Head: Normocephalic and atraumatic.  Eyes: EOM are normal. Pupils are equal, round, and reactive to light.  Neck: Normal range of motion. Neck supple.  Cardiovascular: Regular rhythm.  Tachycardia present.  Exam reveals no gallop and no friction rub.   No murmur heard. Pulmonary/Chest: Effort normal. She has no wheezes. She has no rales.  Abdominal: Soft. She exhibits no distension. There is no tenderness.  Musculoskeletal: She exhibits no edema or tenderness.  Neurological: She is alert and oriented to person, place, and time.  Skin: Skin is warm and dry. She is not diaphoretic.  Psychiatric: She has a normal mood and affect. Her behavior is normal.  Nursing note and vitals reviewed.   ED Course  Procedures (including critical care time) Labs Review Labs Reviewed - No data to display  Imaging Review No results found. I have personally reviewed and evaluated these images and lab results as part of my medical decision-making.   EKG Interpretation   Date/Time:  Monday February 04 2016 13:04:05 EDT Ventricular Rate:  111 PR Interval:  165 QRS Duration: 79 QT Interval:  351 QTC Calculation: 477 R Axis:   -41 Text Interpretation:  Sinus  tachycardia Inferior infarct, old No  significant change since last tracing Confirmed by Alfie Alderfer MD, DANIEL  [phone]) on 02/04/2016 1:28:26 PM      MDM   Final diagnoses:  Seizures ([place])    60 yo F with a chief complaint of a seizure. Has a known seizure disorder that was diagnosed on an EEG. Back at baseline.  Will load her with oral Keppra. However call Dr. [name] today to discuss further treatment. Sinus tachycardia resolved without intervention.   2:46 PM:  I have discussed the diagnosis/risks/treatment options with the patient and family and believe the pt to be eligible for discharge home to follow-up with Neuro. We also discussed returning to the ED immediately if new or worsening sx occur. We discussed the sx which are most concerning (e.g., recurrent event that necessitate immediate return. Medications administered to the patient during their visit and any new prescriptions provided to the patient are listed below.  Medications given during this visit Medications  levETIRAcetam (KEPPRA) tablet 1,500 mg (not administered)  New Prescriptions   No medications on file    The patient appears reasonably screen and/or stabilized for discharge and I doubt any other medical condition or other [facility] requiring further screening, evaluation, or treatment in the ED at this time prior to discharge.      Deno Etienne, DO 02/04/16 [place], DO 02/04/16 1447

## 2016-02-04 NOTE — Discharge Instructions (Signed)
Follow up with your neurologist.  Seizure, Adult A seizure is abnormal electrical activity in the brain. Seizures usually last from 30 seconds to 2 minutes. There are various types of seizures. Before a seizure, you may have a warning sensation (aura) that a seizure is about to occur. An aura may include the following symptoms:   Fear or anxiety.  Nausea.  Feeling like the room is spinning (vertigo).  Vision changes, such as seeing flashing lights or spots. Common symptoms during a seizure include:  A change in attention or behavior (altered mental status).  Convulsions with rhythmic jerking movements.  Drooling.  Rapid eye movements.  Grunting.  Loss of bladder and bowel control.  Bitter taste in the mouth.  Tongue biting. After a seizure, you may feel confused and sleepy. You may also have an injury resulting from convulsions during the seizure. HOME CARE INSTRUCTIONS   If you are given medicines, take them exactly as prescribed by your health care provider.  Keep all follow-up appointments as directed by your health care provider.  Do not swim or drive or engage in risky activity during which a seizure could cause further injury to you or others until your health care provider says it is OK.  Get adequate rest.  Teach friends and family what to do if you have a seizure. They should:  Lay you on the ground to prevent a fall.  Put a cushion under your head.  Loosen any tight clothing around your neck.  Turn you on your side. If vomiting occurs, this helps keep your airway clear.  Stay with you until you recover.  Know whether or not you need emergency care. SEEK IMMEDIATE MEDICAL CARE IF:  The seizure lasts longer than 5 minutes.  The seizure is severe or you do not wake up immediately after the seizure.  You have an altered mental status after the seizure.  You are having more frequent or worsening seizures. Someone should drive you to the emergency  department or call local emergency services (911 in [place] MAKE SURE YOU:  Understand these instructions.  Will watch your condition.  Will get help right away if you are not doing well or get worse.   This information is not intended to replace advice given to you by your health care provider. Make sure you discuss any questions you have with your health care provider.   Document Released: 10/31/2000 Document Revised: 11/24/2014 Document Reviewed: 06/15/2013 Elsevier Interactive Patient Education [employer].

## 2016-02-04 NOTE — ED Notes (Signed)
EMS - Patient coming from home with LOC and possible seizure.  Daughter in law at bedside states she "caught the patient mid fall and the patient was like dead weight for at least 15 minutes" prior to EMS coming.  Denies hitting head.  On Keppra for seizures, last dose at bedtime with last seizure 1 month ago.  No incontinence with EMS just fluttering of the eyes.  Vital signs: 112 HR, 180/120 BP, 210 CBG, 98% RA.

## 2016-02-05 ENCOUNTER — Telehealth: Payer: Self-pay | Admitting: Neurology

## 2016-02-05 MED ORDER — LEVETIRACETAM 500 MG PO TABS
ORAL_TABLET | ORAL | Status: DC
Start: 2016-02-05 — End: 2016-05-28

## 2016-02-05 NOTE — Telephone Encounter (Signed)
Last office visit on 01/08/16 - per notes, she was to increase Keppra 500mg  to one tablet in am and two tablets in pm.  She misunderstood and has only been taking 2 tablets at bedtime.  She will start the increased dose, as instructed by Dr. [name], and call us with any side effect concerns or further seizure activity.  Refills sent to the pharmacy.

## 2016-02-05 NOTE — Telephone Encounter (Signed)
Patient is calling and states that she just got out of the hospital and wants to know if her seizure medicine needs to changed.  Please call.  Thanks!

## 2016-03-21 ENCOUNTER — Ambulatory Visit: Payer: Medicare Other | Admitting: Endocrinology

## 2016-03-28 ENCOUNTER — Ambulatory Visit: Payer: Medicare Other | Admitting: Endocrinology

## 2016-03-28 DIAGNOSIS — Z0289 Encounter for other administrative examinations: Secondary | ICD-10-CM

## 2016-03-31 DIAGNOSIS — E1142 Type 2 diabetes mellitus with diabetic polyneuropathy: Secondary | ICD-10-CM | POA: Diagnosis not present

## 2016-03-31 DIAGNOSIS — E1122 Type 2 diabetes mellitus with diabetic chronic kidney disease: Secondary | ICD-10-CM | POA: Diagnosis not present

## 2016-03-31 DIAGNOSIS — Z794 Long term (current) use of insulin: Secondary | ICD-10-CM | POA: Diagnosis not present

## 2016-03-31 DIAGNOSIS — I129 Hypertensive chronic kidney disease with stage 1 through stage 4 chronic kidney disease, or unspecified chronic kidney disease: Secondary | ICD-10-CM | POA: Diagnosis not present

## 2016-03-31 DIAGNOSIS — N182 Chronic kidney disease, stage 2 (mild): Secondary | ICD-10-CM | POA: Diagnosis not present

## 2016-04-09 DIAGNOSIS — Z961 Presence of intraocular lens: Secondary | ICD-10-CM | POA: Diagnosis not present

## 2016-04-09 DIAGNOSIS — E113293 Type 2 diabetes mellitus with mild nonproliferative diabetic retinopathy without macular edema, bilateral: Secondary | ICD-10-CM | POA: Diagnosis not present

## 2016-04-09 DIAGNOSIS — H52203 Unspecified astigmatism, bilateral: Secondary | ICD-10-CM | POA: Diagnosis not present

## 2016-04-09 DIAGNOSIS — H5201 Hypermetropia, right eye: Secondary | ICD-10-CM | POA: Diagnosis not present

## 2016-05-06 ENCOUNTER — Encounter: Payer: Self-pay | Admitting: Nurse Practitioner

## 2016-05-06 ENCOUNTER — Ambulatory Visit (INDEPENDENT_AMBULATORY_CARE_PROVIDER_SITE_OTHER): Payer: Medicare Other | Admitting: Nurse Practitioner

## 2016-05-06 VITALS — BP 131/93 | HR 92 | Ht 67.0 in | Wt 152.4 lb

## 2016-05-06 DIAGNOSIS — R269 Unspecified abnormalities of gait and mobility: Secondary | ICD-10-CM

## 2016-05-06 DIAGNOSIS — E1142 Type 2 diabetes mellitus with diabetic polyneuropathy: Secondary | ICD-10-CM

## 2016-05-06 DIAGNOSIS — R569 Unspecified convulsions: Secondary | ICD-10-CM | POA: Diagnosis not present

## 2016-05-06 NOTE — Progress Notes (Signed)
[facility]  PATIENT: Tricia Huynh DOB: [birth year]   REASON FOR VISIT: follow up for seizure disorder, abnormality of gait, diabetic polyneuropathy HISTORY FROM:patient    HISTORY OF PRESENT ILLNESS:Tricia Huynh is a 60 years old right-handed female, seen in refer by her primary care physician Dr. Audley Hose for evaluation of memory trouble, seizure, she took scat transportation to office, alone at today's clinical visit in July 04 2015.  She had a history of insulin-dependent diabetes, hypertension, hyperlipidemia, lives with her granddaughters, she has been on disability due to her depression since 1998, had 10 years education, used to work at [employer],  She was admitted to the hospital from July 20 to 21 2016, was found down at home, confused, with bowel and bladder incontinence, she was seen by neuro hospitalists, abnormal EEG in July 2016,focal slowing over the occipital regions, left greater than right, Posterior-dominant photoepileptiform response, asymmetric more on the left occipital region, she was started on Keppra 500 mg twice a day, no recurrent seizure event  I have reviewed MRI of the brain without contrast of July 2016:1. No acute intracranial infarct or other abnormality identified. Mild age-related cerebral atrophy. 2.5 cm well-circumscribed ovoid lesion within the left occipital scalp. This lesion is indeterminate, but most certainly benign.  Laboratory evaluation, creatinine 1. 3, low TSH 0.2 CBC showed mild anemia hemoglobin 11 point 5, negative ammonia, UDS, A1c 11. 8,   UPDATE Oct 04 2015:YY She is with her granddaughter [name], She has one seizure in Oct 02 2015, she was not able to elaborate on details. She lives with her granddaughter, she has gait difficulty. She denies significant pain. She has no bladder incontinence, she has mild constipation. 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 Her migraines has much improved  UPDATE Jan 08 2016:YY She is with her daughter at today's clinical visit, she was diagnosed with UTI, was put on antibiotic, has decreased appetite, generalized weakness, continue has gait difficulty, memory trouble, We have personally reviewed her MRI in July 2016, generalized atrophy, especially along perisylvian fissure MRI of lumbar, mild degenerative changes, no significant foraminal or canal stenosis, EMG nerve conduction study January 2017 consistent with mild axonal peripheral neuropathy, consistent with her poorly controlled diabetes, most recent A1c was 11.9, she has a follow-up appointment with endocrinologist Dr. Hilliard Clark today We reviewed laboratory evaluation, mild anemia 10 point 8, She has no recurrent seizure, able to tolerate Keppra 500/1000 mg every night, she lives with her granddaughter, has home health nurse prepare her medications, UPDATE 06/20/2017CM Ms. Brugh 60 year old female returns for follow-up.She has history of seizure disorder and is currently on Keppra 500 mg 1 in a.m. And 2 PM. Last seizure November 2016. She is also insulin-dependent diabetic and her CBGs this morning 293. EMG consistent with axonal peripheral neuropathy consistent with poorly controlled diabetes.MRI of the brain in  2016 with generalized atrophy.She returns for reevaluation.  REVIEW OF SYSTEMS: Full 14 system review of systems performed and notable only for those listed, all others are neg:  Constitutional: neg  Cardiovascular: neg Ear/Nose/Throat: neg  Skin: neg Eyes: neg Respiratory: neg Gastroitestinal: neg  Hematology/Lymphatic: neg  Endocrine: excessive thirst Musculoskeletal:neg Allergy/Immunology: neg Neurological: seizure  disorder Psychiatric: depression  Sleep : neg   ALLERGIES: Allergies  Allergen Reactions  . Percocet [Oxycodone-Acetaminophen] Nausea And Vomiting and Other (See Comments)    Shaky/unsteady.  . Vicodin [Hydrocodone-Acetaminophen] Nausea And Vomiting and Other (See Comments)    Shaky/unsteady.    HOME MEDICATIONS: Outpatient Prescriptions Prior to Visit  Medication Sig Dispense Refill  . amitriptyline (ELAVIL) 50 MG tablet Take 50 mg by mouth at bedtime.  1  . amLODipine-olmesartan (AZOR) 5-40 MG per tablet Take 1 tablet by mouth daily. Reported on 01/02/2016    . atorvastatin (LIPITOR) 40 MG tablet Take 40 mg by mouth daily.  1  . Blood Glucose Monitoring Suppl (ACCU-CHEK AVIVA) device Use as instructed 1 each 0  . gabapentin (NEURONTIN) 600 MG tablet Take 600 mg by mouth 2 (two) times daily.   0  . glucose blood (ACCU-CHEK AVIVA) test strip 1 each by Other route 2 (two) times daily. And lancets 2/day 100 each 12  . hydrochlorothiazide (HYDRODIURIL) 25 MG tablet Take 1 tablet (25 mg total) by mouth daily. 30 tablet 0  . Insulin Glargine (LANTUS SOLOSTAR) 100 UNIT/ML Solostar Pen Inject 80 Units into the skin every morning. (Patient taking differently: Inject 70 Units into the skin every morning. ) 10 pen PRN  . levETIRAcetam (KEPPRA) 500 MG tablet One in the morning, 2 tabs at night 90 tablet 11  . metoprolol succinate (TOPROL-XL) 25 MG 24 hr tablet Take 50 mg by mouth at bedtime.  1  . sucralfate (CARAFATE) 1 G tablet Take 1 tablet (1 g total) by mouth 4 (four) times daily -  with meals and at bedtime. 30 tablet 0   No facility-administered medications prior to visit.    PAST MEDICAL HISTORY: Past Medical History  Diagnosis Date  . Diabetes mellitus without complication ([place])   . Hypertension   . Mood disorder ([place]) 06/06/2015  . Essential hypertension 06/06/2015  . Dyslipidemia 06/06/2015  . Diabetic neuropathy ([place]) 06/06/2015  . Diabetes mellitus type 2 in nonobese ([name])  06/06/2015  . Encephalopathy   . Seizure ([place])     sees [name]. abd eeg, on keppra    PAST SURGICAL HISTORY: Past Surgical History  Procedure Laterality Date  . Abdominal hysterectomy    . Tubal ligation    . Eye surgery Bilateral     FAMILY HISTORY: Family History  Problem Relation Age of Onset  . Heart disease Father   . Kidney disease Father   . Diabetes Mother   . Seizures Sister   . Seizures Brother     SOCIAL HISTORY: Social History   Social History  . Marital Status: Single    Spouse Name: N/A  . Number of Children: 3  . Years of Education: 10   Occupational History  . Disabled    Social History Main Topics  . Smoking status: Former Research scientist (life sciences)  . Smokeless tobacco: Never Used  . Alcohol Use: No  . Drug Use: No  . Sexual Activity: Yes   Other Topics Concern  . Not on file   Social History Narrative   Lives at home with her grandchildren.   Right-handed.   No caffeine use.     PHYSICAL EXAM  Filed Vitals:   05/06/16 1303  BP: 131/93  Pulse: 92  Height: 5\' 7"  (1.702 m)  Weight: 152 lb 6.4 oz (69.128 kg)   Body mass index is 23.86 kg/(m^2).  Generalized: Well developed, in no acute distress , well-groomed Head: normocephalic and atraumatic,. Oropharynx benign  Neck: Supple, no  carotid bruits  Cardiac: Regular rate rhythm, no murmur  Musculoskeletal: No deformity   Neurological examination   Mentation: Alert oriented to time, place, history taking. Attention span and concentration appropriate.  Follows all commands speech and language fluent.MMSE not done   Cranial nerve II-XII: Fundoscopic exam reveals sharp disc margins.Pupils were equal round reactive to light extraocular movements were full, visual field were full on confrontational test. Facial sensation and strength were normal. hearing was intact to finger rubbing bilaterally. Uvula tongue midline. head turning and shoulder shrug were normal and symmetric.Tongue protrusion into cheek strength  was normal. Motor: normal bulk and tone,mild bilateral hip flexion weakness Sensory: length dependent decreased light touch and pinprick and vibratory sensation in the lower extremities  Coordination: finger-nose-finger, heel-to-shin bilaterally, no dysmetria Reflexes: Brachioradialis 2/2, biceps 2/2, triceps 2/2, patellar 2/2, Achilles 2/2, plantar responses were flexor bilaterally. Gait and Station: Rising up from seated position without assistance, cautious gait  able to perform tiptoe, and heel walking without difficulty. Tandem gait is unsteady. No assistive device  DIAGNOSTIC DATA (LABS, IMAGING, TESTING) - I reviewed patient records, labs, notes, testing and imaging myself where available.  Lab Results  Component Value Date   WBC 4.1 12/25/2015   HGB 10.2* 12/25/2015   HCT 30.2* 12/25/2015   MCV 86.8 12/25/2015   PLT 231 12/25/2015      Component Value Date/Time   NA 136 12/25/2015 2006   K 4.1 12/25/2015 2006   CL 98* 12/25/2015 2006   CO2 27 12/25/2015 2006   GLUCOSE 274* 12/25/2015 2006   BUN 14 12/25/2015 2006   CREATININE 1.07* 12/25/2015 2006   CALCIUM 9.6 12/25/2015 2006   PROT 7.6 12/25/2015 2006   ALBUMIN 3.7 12/25/2015 2006   AST 21 12/25/2015 2006   ALT 17 12/25/2015 2006   ALKPHOS 136* 12/25/2015 2006   BILITOT 0.2* 12/25/2015 2006   GFRNONAA 64* 12/25/2015 2006   GFRAA >60 12/25/2015 2006    Lab Results  Component Value Date   HGBA1C 11.8 12/06/2015       ASSESSMENT AND PLAN  60 y.o. year old female  has a past medical history of seizure disorder with last seizure occurring in November 2016, memory loss with MRI of the brain with evidence of generalized atrophy, mood disorder likely plays a role as well. Multifactorial gait disorder from deconditioning and diabetic peripheral neuropathy.   Continue Keppra 500 mg 1 in the morning and 2 at night does not need refills Exercise every day by walking this will help your overall general health and  diabetes Follow-up in 6 months Dennie Bible, [facility], [facility], APRN  [facility] [address], [place], [place] [address] [phone]

## 2016-05-06 NOTE — Progress Notes (Signed)
I have reviewed and agreed above plan. 

## 2016-05-06 NOTE — Patient Instructions (Addendum)
Continue Keppra 500 mg 1 in the morning and 2 at night Exercise every day by walking this will help your overall general health and diabetes Follow-up in 6 months

## 2016-05-12 DIAGNOSIS — E1121 Type 2 diabetes mellitus with diabetic nephropathy: Secondary | ICD-10-CM | POA: Diagnosis not present

## 2016-05-12 DIAGNOSIS — E1140 Type 2 diabetes mellitus with diabetic neuropathy, unspecified: Secondary | ICD-10-CM | POA: Diagnosis not present

## 2016-05-12 DIAGNOSIS — I10 Essential (primary) hypertension: Secondary | ICD-10-CM | POA: Diagnosis not present

## 2016-05-12 DIAGNOSIS — Z79899 Other long term (current) drug therapy: Secondary | ICD-10-CM | POA: Diagnosis not present

## 2016-05-12 DIAGNOSIS — E785 Hyperlipidemia, unspecified: Secondary | ICD-10-CM | POA: Diagnosis not present

## 2016-05-12 DIAGNOSIS — Z7984 Long term (current) use of oral hypoglycemic drugs: Secondary | ICD-10-CM | POA: Diagnosis not present

## 2016-05-12 DIAGNOSIS — E559 Vitamin D deficiency, unspecified: Secondary | ICD-10-CM | POA: Diagnosis not present

## 2016-05-12 DIAGNOSIS — N182 Chronic kidney disease, stage 2 (mild): Secondary | ICD-10-CM | POA: Diagnosis not present

## 2016-05-12 DIAGNOSIS — Z0000 Encounter for general adult medical examination without abnormal findings: Secondary | ICD-10-CM | POA: Diagnosis not present

## 2016-05-26 ENCOUNTER — Encounter (HOSPITAL_COMMUNITY): Payer: Self-pay | Admitting: Emergency Medicine

## 2016-05-26 ENCOUNTER — Emergency Department (HOSPITAL_COMMUNITY): Payer: Medicare Other

## 2016-05-26 ENCOUNTER — Observation Stay (HOSPITAL_COMMUNITY)
Admission: EM | Admit: 2016-05-26 | Discharge: 2016-05-28 | Disposition: A | Discharge status: Home / Self Care | Payer: Medicare Other | Attending: Internal Medicine | Admitting: Internal Medicine

## 2016-05-26 DIAGNOSIS — Z87891 Personal history of nicotine dependence: Secondary | ICD-10-CM | POA: Diagnosis not present

## 2016-05-26 DIAGNOSIS — E785 Hyperlipidemia, unspecified: Secondary | ICD-10-CM

## 2016-05-26 DIAGNOSIS — N183 Chronic kidney disease, stage 3 unspecified: Secondary | ICD-10-CM | POA: Diagnosis present

## 2016-05-26 DIAGNOSIS — R935 Abnormal findings on diagnostic imaging of other abdominal regions, including retroperitoneum: Secondary | ICD-10-CM | POA: Diagnosis not present

## 2016-05-26 DIAGNOSIS — F39 Unspecified mood [affective] disorder: Secondary | ICD-10-CM | POA: Diagnosis not present

## 2016-05-26 DIAGNOSIS — E1140 Type 2 diabetes mellitus with diabetic neuropathy, unspecified: Secondary | ICD-10-CM | POA: Diagnosis not present

## 2016-05-26 DIAGNOSIS — G9340 Encephalopathy, unspecified: Secondary | ICD-10-CM | POA: Diagnosis present

## 2016-05-26 DIAGNOSIS — E1122 Type 2 diabetes mellitus with diabetic chronic kidney disease: Secondary | ICD-10-CM | POA: Diagnosis not present

## 2016-05-26 DIAGNOSIS — E0865 Diabetes mellitus due to underlying condition with hyperglycemia: Secondary | ICD-10-CM

## 2016-05-26 DIAGNOSIS — E11649 Type 2 diabetes mellitus with hypoglycemia without coma: Secondary | ICD-10-CM | POA: Diagnosis not present

## 2016-05-26 DIAGNOSIS — G40909 Epilepsy, unspecified, not intractable, without status epilepticus: Secondary | ICD-10-CM | POA: Diagnosis not present

## 2016-05-26 DIAGNOSIS — E0821 Diabetes mellitus due to underlying condition with diabetic nephropathy: Secondary | ICD-10-CM

## 2016-05-26 DIAGNOSIS — Z794 Long term (current) use of insulin: Secondary | ICD-10-CM

## 2016-05-26 DIAGNOSIS — D649 Anemia, unspecified: Secondary | ICD-10-CM | POA: Diagnosis not present

## 2016-05-26 DIAGNOSIS — E1169 Type 2 diabetes mellitus with other specified complication: Secondary | ICD-10-CM

## 2016-05-26 DIAGNOSIS — R4182 Altered mental status, unspecified: Secondary | ICD-10-CM | POA: Insufficient documentation

## 2016-05-26 DIAGNOSIS — I10 Essential (primary) hypertension: Secondary | ICD-10-CM | POA: Diagnosis present

## 2016-05-26 DIAGNOSIS — E161 Other hypoglycemia: Secondary | ICD-10-CM | POA: Diagnosis not present

## 2016-05-26 DIAGNOSIS — I129 Hypertensive chronic kidney disease with stage 1 through stage 4 chronic kidney disease, or unspecified chronic kidney disease: Secondary | ICD-10-CM | POA: Insufficient documentation

## 2016-05-26 DIAGNOSIS — Z79899 Other long term (current) drug therapy: Secondary | ICD-10-CM | POA: Insufficient documentation

## 2016-05-26 DIAGNOSIS — E162 Hypoglycemia, unspecified: Secondary | ICD-10-CM | POA: Diagnosis not present

## 2016-05-26 DIAGNOSIS — R51 Headache: Secondary | ICD-10-CM | POA: Diagnosis not present

## 2016-05-26 DIAGNOSIS — R569 Unspecified convulsions: Secondary | ICD-10-CM

## 2016-05-26 DIAGNOSIS — R413 Other amnesia: Secondary | ICD-10-CM | POA: Diagnosis present

## 2016-05-26 LAB — COMPREHENSIVE METABOLIC PANEL
ALT: 36 U/L (ref 14–54)
AST: 31 U/L (ref 15–41)
Albumin: 3.5 g/dL (ref 3.5–5.0)
Alkaline Phosphatase: 165 U/L — ABNORMAL HIGH (ref 38–126)
Anion gap: 6 (ref 5–15)
BUN: 12 mg/dL (ref 6–20)
CO2: 25 mmol/L (ref 22–32)
Calcium: 9.1 mg/dL (ref 8.9–10.3)
Chloride: 108 mmol/L (ref 101–111)
Creatinine, Ser: 1.09 mg/dL — ABNORMAL HIGH (ref 0.44–1.00)
GFR calc Af Amer: >60 mL/min (ref >60)
GFR calc non Af Amer: 54 mL/min — ABNORMAL LOW (ref >60)
Glucose, Bld: 183 mg/dL — ABNORMAL HIGH (ref 65–99)
Potassium: 3.7 mmol/L (ref 3.5–5.1)
Sodium: 139 mmol/L (ref 135–145)
Total Bilirubin: 0.5 mg/dL (ref 0.3–1.2)
Total Protein: 7.6 g/dL (ref 6.5–8.1)

## 2016-05-26 LAB — URINALYSIS, ROUTINE W REFLEX MICROSCOPIC
Bilirubin Urine: NEGATIVE
Glucose, UA: 100 mg/dL — AB
Hgb urine dipstick: NEGATIVE
Ketones, ur: NEGATIVE mg/dL
Leukocytes, UA: NEGATIVE
Nitrite: POSITIVE — AB
Protein, ur: NEGATIVE mg/dL
Specific Gravity, Urine: 1.025 (ref 1.005–1.030)
pH: 5 (ref 5.0–8.0)

## 2016-05-26 LAB — CBC
HCT: 31.2 % — ABNORMAL LOW (ref 36.0–46.0)
Hemoglobin: 10.2 g/dL — ABNORMAL LOW (ref 12.0–15.0)
MCH: 28.7 pg (ref 26.0–34.0)
MCHC: 32.7 g/dL (ref 30.0–36.0)
MCV: 87.9 fL (ref 78.0–100.0)
Platelets: 255 10*3/uL (ref 150–400)
RBC: 3.55 MIL/uL — ABNORMAL LOW (ref 3.87–5.11)
RDW: 12.8 % (ref 11.5–15.5)
WBC: 6.6 10*3/uL (ref 4.0–10.5)

## 2016-05-26 LAB — RAPID URINE DRUG SCREEN, HOSP PERFORMED
Amphetamines: NOT DETECTED
Amphetamines: NOT DETECTED
Barbiturates: NOT DETECTED
Barbiturates: NOT DETECTED
Benzodiazepines: NOT DETECTED
Benzodiazepines: NOT DETECTED
Cocaine: NOT DETECTED
Cocaine: NOT DETECTED
Opiates: NOT DETECTED
Opiates: NOT DETECTED
Tetrahydrocannabinol: NOT DETECTED
Tetrahydrocannabinol: NOT DETECTED

## 2016-05-26 LAB — CBG MONITORING, ED
Glucose-Capillary: 116 mg/dL — ABNORMAL HIGH (ref 65–99)
Glucose-Capillary: 123 mg/dL — ABNORMAL HIGH (ref 65–99)
Glucose-Capillary: 76 mg/dL (ref 65–99)

## 2016-05-26 LAB — I-STAT CHEM 8, ED
BUN: 13 mg/dL (ref 6–20)
Calcium, Ion: 1.19 mmol/L (ref 1.12–1.23)
Chloride: 105 mmol/L (ref 101–111)
Creatinine, Ser: 1 mg/dL (ref 0.44–1.00)
Glucose, Bld: 181 mg/dL — ABNORMAL HIGH (ref 65–99)
HCT: 29 % — ABNORMAL LOW (ref 36.0–46.0)
Hemoglobin: 9.9 g/dL — ABNORMAL LOW (ref 12.0–15.0)
Potassium: 3.8 mmol/L (ref 3.5–5.1)
Sodium: 140 mmol/L (ref 135–145)
TCO2: 26 mmol/L (ref 0–100)

## 2016-05-26 LAB — CBC WITH DIFFERENTIAL/PLATELET
Basophils Absolute: 0 10*3/uL (ref 0.0–0.1)
Basophils Relative: 0 %
Eosinophils Absolute: 0 10*3/uL (ref 0.0–0.7)
Eosinophils Relative: 0 %
HCT: 30.4 % — ABNORMAL LOW (ref 36.0–46.0)
Hemoglobin: 9.8 g/dL — ABNORMAL LOW (ref 12.0–15.0)
Lymphocytes Relative: 15 %
Lymphs Abs: 1 10*3/uL (ref 0.7–4.0)
MCH: 28.6 pg (ref 26.0–34.0)
MCHC: 32.2 g/dL (ref 30.0–36.0)
MCV: 88.6 fL (ref 78.0–100.0)
Monocytes Absolute: 0.3 10*3/uL (ref 0.1–1.0)
Monocytes Relative: 5 %
Neutro Abs: 5.3 10*3/uL (ref 1.7–7.7)
Neutrophils Relative %: 80 %
Platelets: 264 10*3/uL (ref 150–400)
RBC: 3.43 MIL/uL — ABNORMAL LOW (ref 3.87–5.11)
RDW: 12.8 % (ref 11.5–15.5)
WBC: 6.7 10*3/uL (ref 4.0–10.5)

## 2016-05-26 LAB — I-STAT TROPONIN, ED: Troponin i, poc: 0 ng/mL (ref 0.00–0.08)

## 2016-05-26 LAB — URINE MICROSCOPIC-ADD ON: RBC / HPF: NONE SEEN RBC/hpf (ref 0–5)

## 2016-05-26 LAB — GLUCOSE, CAPILLARY: Glucose-Capillary: 88 mg/dL (ref 65–99)

## 2016-05-26 LAB — MAGNESIUM: Magnesium: 2.3 mg/dL (ref 1.7–2.4)

## 2016-05-26 LAB — ETHANOL
Alcohol, Ethyl (B): <5 mg/dL (ref <5)
Alcohol, Ethyl (B): <5 mg/dL (ref <5)

## 2016-05-26 MED ORDER — LORAZEPAM 2 MG/ML IJ SOLN: 1.0000 mg | Freq: Four times a day (QID) | INTRAMUSCULAR | Status: DC | PRN

## 2016-05-26 MED ORDER — HYDROCHLOROTHIAZIDE 25 MG PO TABS
25.0000 mg | ORAL_TABLET | Freq: Every day | ORAL | Status: DC
  Administered 2016-05-26 – 2016-05-28 (×3): 25 mg via ORAL
  Filled 2016-05-26 (×3): qty 1

## 2016-05-26 MED ORDER — METOPROLOL SUCCINATE ER 25 MG PO TB24
50.0000 mg | ORAL_TABLET | Freq: Every day | ORAL | Status: DC
  Administered 2016-05-26 – 2016-05-27 (×2): 50 mg via ORAL
  Filled 2016-05-26 (×2): qty 2

## 2016-05-26 MED ORDER — AMLODIPINE BESYLATE 5 MG PO TABS
5.0000 mg | ORAL_TABLET | Freq: Every day | ORAL | Status: DC
  Administered 2016-05-26 – 2016-05-28 (×3): 5 mg via ORAL
  Filled 2016-05-26 (×3): qty 1

## 2016-05-26 MED ORDER — LEVETIRACETAM 750 MG PO TABS
750.0000 mg | ORAL_TABLET | Freq: Two times a day (BID) | ORAL | Status: DC
  Administered 2016-05-27 – 2016-05-28 (×3): 750 mg via ORAL
  Filled 2016-05-26 (×3): qty 1

## 2016-05-26 MED ORDER — SODIUM CHLORIDE 0.9 % IV SOLN: 500.0000 mg | Freq: Every morning | INTRAVENOUS | Status: DC

## 2016-05-26 MED ORDER — INSULIN ASPART 100 UNIT/ML ~~LOC~~ SOLN: 0.0000 [IU] | Freq: Every day | SUBCUTANEOUS | Status: DC

## 2016-05-26 MED ORDER — AMLODIPINE-OLMESARTAN 5-40 MG PO TABS: 1.0000 | ORAL_TABLET | Freq: Every day | ORAL | Status: DC

## 2016-05-26 MED ORDER — IRBESARTAN 300 MG PO TABS
300.0000 mg | ORAL_TABLET | Freq: Every day | ORAL | Status: DC
  Administered 2016-05-26 – 2016-05-28 (×3): 300 mg via ORAL
  Filled 2016-05-26 (×3): qty 1

## 2016-05-26 MED ORDER — INSULIN GLARGINE 100 UNIT/ML ~~LOC~~ SOLN
35.0000 [IU] | SUBCUTANEOUS | Status: DC
  Filled 2016-05-26: qty 0.35

## 2016-05-26 MED ORDER — SODIUM CHLORIDE 0.9 % IV SOLN
1000.0000 mg | Freq: Once | INTRAVENOUS | Status: AC
  Administered 2016-05-26: 1000 mg via INTRAVENOUS
  Filled 2016-05-26: qty 10

## 2016-05-26 MED ORDER — ONDANSETRON HCL 4 MG PO TABS: 4.0000 mg | ORAL_TABLET | Freq: Four times a day (QID) | ORAL | Status: DC | PRN

## 2016-05-26 MED ORDER — ONDANSETRON HCL 4 MG/2ML IJ SOLN: 4.0000 mg | Freq: Four times a day (QID) | INTRAMUSCULAR | Status: DC | PRN

## 2016-05-26 MED ORDER — SODIUM CHLORIDE 0.9 % IV SOLN: 1000.0000 mg | Freq: Every day | INTRAVENOUS | Status: DC

## 2016-05-26 MED ORDER — SUCRALFATE 1 G PO TABS
1.0000 g | ORAL_TABLET | Freq: Three times a day (TID) | ORAL | Status: DC
  Administered 2016-05-26 – 2016-05-28 (×9): 1 g via ORAL
  Filled 2016-05-26 (×9): qty 1

## 2016-05-26 MED ORDER — INSULIN ASPART 100 UNIT/ML ~~LOC~~ SOLN: 0.0000 [IU] | Freq: Three times a day (TID) | SUBCUTANEOUS | Status: DC

## 2016-05-26 MED ORDER — SODIUM CHLORIDE 0.9 % IV SOLN
75.0000 mL/h | INTRAVENOUS | Status: DC
  Administered 2016-05-26 – 2016-05-27 (×2): 75 mL/h via INTRAVENOUS

## 2016-05-26 MED ORDER — SODIUM CHLORIDE 0.9 % IV SOLN
1000.0000 mg | Freq: Once | INTRAVENOUS | Status: DC
  Filled 2016-05-26: qty 10

## 2016-05-26 MED ORDER — IOPAMIDOL (ISOVUE-370) INJECTION 76%
75.0000 mL | Freq: Once | INTRAVENOUS | Status: AC | PRN
  Administered 2016-05-26: 75 mL via INTRAVENOUS

## 2016-05-26 MED ORDER — ASPIRIN 300 MG RE SUPP: 300.0000 mg | Freq: Once | RECTAL | Status: DC

## 2016-05-26 MED ORDER — SODIUM CHLORIDE 0.9 % IV SOLN
1000.0000 mg | Freq: Once | INTRAVENOUS | Status: DC
  Filled 2016-05-26 (×2): qty 10

## 2016-05-26 MED ORDER — GABAPENTIN 600 MG PO TABS
600.0000 mg | ORAL_TABLET | Freq: Two times a day (BID) | ORAL | Status: DC
  Administered 2016-05-26 – 2016-05-28 (×4): 600 mg via ORAL
  Filled 2016-05-26 (×4): qty 1

## 2016-05-26 NOTE — ED Notes (Signed)
Pt CBG, 123. Nurse was notified.

## 2016-05-26 NOTE — Progress Notes (Signed)
RN informed me that pt asks for diet. Her mental status now at baseline, alert and oriented so diet order placed.  Leisa Lenz

## 2016-05-26 NOTE — Progress Notes (Signed)
No Keppra noted in bed or at bedside when pt arrive to floor. [place] questioned why med wasn't given in ED. This RN verbalized that she need to contact ED staff for that. Awaiting for another Keppra dose from pharmacy at this time.    Ave Filter, RN

## 2016-05-26 NOTE — ED Notes (Addendum)
Pt CBG, 116. Nurse was notified.

## 2016-05-26 NOTE — Consult Note (Signed)
NEURO HOSPITALIST CONSULT NOTE      Reason for Consult:Altered mental status   HPI:                                                                                                                                          Tricia Huynh is an 60 y.o. female with history of uncontrolled diabetes, chronic kidney disease, hypertension, seizure disorder secondary to mild generalized atrophy or mild cognitive impairment presented with altered mental status. Per family she was confused this morning and was unable to speak. When EMS arrived her blood sugar was reported at 31 she was given 1 amp of D50. Now patient mental status has significantly improved. She is able to follow commands and able to have appropriate conversation. She states that she does not remember what happened. Patient had a brain MRI which does not show any acute abnormalities unchanged from previous MRI. Mild cerebral atrophy, nonspecific benign subgaleal/subcutaneous mass of benign etiology. Currently she denies any chest pain, palpitation, fever, weakness, headache, numbness no change in vision, bowel or urinary habits.   Past Medical History  Diagnosis Date  . Diabetes mellitus without complication ([place])   . Hypertension   . Mood disorder ([place]) 06/06/2015  . Essential hypertension 06/06/2015  . Dyslipidemia 06/06/2015  . Diabetic neuropathy ([place]) 06/06/2015  . Diabetes mellitus type 2 in nonobese ([place]) 06/06/2015  . Encephalopathy   . Seizure [facility])     sees [name]. abd eeg, on keppra    Past Surgical History  Procedure Laterality Date  . Abdominal hysterectomy    . Tubal ligation    . Eye surgery Bilateral     Family History  Problem Relation Age of Onset  . Heart disease Father   . Kidney disease Father   . Diabetes Mother   . Seizures Sister   . Seizures Brother     *  Social History:  reports that she has quit smoking. She has never used smokeless tobacco. She reports that she does  not drink alcohol or use illicit drugs.  Allergies  Allergen Reactions  . Percocet [Oxycodone-Acetaminophen] Nausea And Vomiting and Other (See Comments)    Shaky/unsteady.  . Vicodin [Hydrocodone-Acetaminophen] Nausea And Vomiting and Other (See Comments)    Shaky/unsteady.    MEDICATIONS:  Prior to Admission:  Prescriptions prior to admission  Medication Sig Dispense Refill Last Dose  . amitriptyline (ELAVIL) 50 MG tablet Take 50 mg by mouth at bedtime.  1 05/25/2016 at Unknown time  . amLODipine-olmesartan (AZOR) 5-40 MG per tablet Take 1 tablet by mouth daily. Reported on 01/02/2016   05/25/2016 at Unknown time  . atorvastatin (LIPITOR) 40 MG tablet Take 40 mg by mouth daily.  1 05/25/2016 at Unknown time  . gabapentin (NEURONTIN) 600 MG tablet Take 600 mg by mouth 2 (two) times daily.   0 05/25/2016 at Unknown time  . hydrochlorothiazide (HYDRODIURIL) 25 MG tablet Take 1 tablet (25 mg total) by mouth daily. 30 tablet 0 05/25/2016 at Unknown time  . Insulin Glargine (LANTUS SOLOSTAR) 100 UNIT/ML Solostar Pen Inject 80 Units into the skin every morning. (Patient taking differently: Inject 70 Units into the skin every morning. ) 10 pen PRN 05/25/2016 at Unknown time  . levETIRAcetam (KEPPRA) 500 MG tablet One in the morning, 2 tabs at night (Patient taking differently: Take 500 mg by mouth See admin instructions. ) 90 tablet 11 05/25/2016 at Unknown time  . metoprolol succinate (TOPROL-XL) 25 MG 24 hr tablet Take 50 mg by mouth at bedtime.  1 05/25/2016 at 2200  . sucralfate (CARAFATE) 1 G tablet Take 1 tablet (1 g total) by mouth 4 (four) times daily -  with meals and at bedtime. 30 tablet 0 05/25/2016 at Unknown time  . Blood Glucose Monitoring Suppl (ACCU-CHEK AVIVA) device Use as instructed 1 each 0 Taking  . glucose blood (ACCU-CHEK AVIVA) test strip 1 each by Other route 2 (two) times  daily. And lancets 2/day 100 each 12 Taking     ROS:                                                                                                                                       As mentioned in history of present illness   Blood pressure 140/86, pulse 78, temperature 98 F (36.7 C), temperature source Oral, resp. rate 16, SpO2 100 %.   Neurologic Examination:                                                                                                      Gen. lying in bed and appears comfortable Neck supple without any lymphadenopathy Lungs clear to auscultation CVS S1-S2 regular Skin no signs or scars or bruises Psychiatric mildly confused Neurologic alert and oriented 2 only Cranial nerves II  through XII intact Motor exam did not reveal any focal weakness deep tendon reflexes are diffusely absent Sensations, decreased sensation to temperature in the feet On outstretched hand she has a very mild negative myoclonus or asterixis, finger-to-nose test is within normal limits Speech is fluent some word finding difficulty Gait not assessed    Lab Results: Basic Metabolic Panel:  Recent Labs Lab 05/26/16 1315 05/26/16 1338  NA 139 140  K 3.7 3.8  CL 108 105  CO2 25  --   GLUCOSE 183* 181*  BUN 12 13  CREATININE 1.09* 1.00  CALCIUM 9.1  --     Liver Function Tests:  Recent Labs Lab 05/26/16 1315  AST 31  ALT 36  ALKPHOS 165*  BILITOT 0.5  PROT 7.6  ALBUMIN 3.5   No results for input(s): LIPASE, AMYLASE in the last 168 hours. No results for input(s): AMMONIA in the last 168 hours.  CBC:  Recent Labs Lab 05/26/16 1315 05/26/16 1338 05/26/16 1802  WBC 6.7  --  6.6  NEUTROABS 5.3  --   --   HGB 9.8* 9.9* 10.2*  HCT 30.4* 29.0* 31.2*  MCV 88.6  --  87.9  PLT 264  --  255    Cardiac Enzymes: No results for input(s): CKTOTAL, CKMB, CKMBINDEX, TROPONINI in the last 168 hours.  Lipid Panel: No results for input(s): CHOL, TRIG, HDL,  CHOLHDL, VLDL, LDLCALC in the last 168 hours.  CBG:  Recent Labs Lab 05/26/16 1212 05/26/16 1316 05/26/16 1642  GLUCAP 116* 123* 20    Microbiology: Results for orders placed or performed during the hospital encounter of 12/25/15  Urine culture     Status: None   Collection Time: 12/26/15  1:35 AM  Result Value Ref Range Status   Specimen Description URINE, CLEAN CATCH  Final   Special Requests NONE  Final   Culture MULTIPLE SPECIES PRESENT, SUGGEST RECOLLECTION  Final   Report Status 12/27/2015 FINAL  Final    Coagulation Studies: No results for input(s): LABPROT, INR in the last 72 hours.  Imaging: Dg Chest 2 View  05/26/2016  CLINICAL DATA:  Altered level of consciousness for 1 day. Ex-smoker. History of hypertension. EXAM: CHEST  2 VIEW COMPARISON:  Chest x-rays dated 12/25/2015 and 09/18/2015. FINDINGS: Mediastinum is widened, a significant change compared to previous exams. Heart appears enlarged, also a change from previous exam. Lungs are clear. No evidence of pneumonia. No pleural effusion or pneumothorax seen. Osseous structures about the chest are unremarkable. IMPRESSION: Mediastinum is widened and heart appears enlarged, both representing a significant change compared to previous exams. Mediastinal mass, lymphadenopathy, aneurysm and pericardial effusion are all possibilities and chest CT is recommended for further characterization. These results were called by telephone at the time of interpretation on 05/26/2016 at 1:26 pm to Dr. Montine Circle , who verbally acknowledged these results. Electronically Signed   By: Franki Cabot M.D.   On: 05/26/2016 13:26   Ct Head Wo Contrast  05/26/2016  CLINICAL DATA:  Patient non-verbal nods her head to having a headache. Low blood sugar Question if any trauma EXAM: CT HEAD WITHOUT CONTRAST TECHNIQUE: Contiguous axial images were obtained from the base of the skull through the vertex without intravenous contrast. COMPARISON:   11/11/2015 FINDINGS: The ventricles are normal in configuration. There is mild ventricular and sulcal enlargement reflecting mild atrophy, stable. There are no parenchymal masses or mass effect. There is no evidence of an infarct. There are no extra-axial masses or abnormal fluid  collections. There is no intracranial hemorrhage. Homogeneous circumscribed low-attenuation subgaleal lesion with associated remodeling of the parietal bone on the left is stable. Moderate ethmoid sinus mucosal thickening.  Clear mastoid air cells. IMPRESSION: 1. No acute intracranial abnormalities. No change from the prior study. Electronically Signed   By: Lajean Manes M.D.   On: 05/26/2016 13:27   Mr Brain Wo Contrast  05/26/2016  CLINICAL DATA:  60 year old hypertensive diabetic female with history seizures presenting with altered mental status. Confused. Blood sugar 31. Subsequent encounter. EXAM: MRI HEAD WITHOUT CONTRAST TECHNIQUE: Multiplanar, multiecho pulse sequences of the brain and surrounding structures were obtained without intravenous contrast. COMPARISON:  05/26/2016 head CT.  06/06/2015 brain MR. FINDINGS: No acute infarct or intracranial hemorrhage. Minimal nonspecific white matter changes consistent with chronic microvascular changes. Mild global atrophy without hydrocephalus. Nonspecific 2.6 x 3.7 x 1.1 cm left occipital region subgaleal/subcutaneous mass with bony remodeling/thinning similar to prior exam of indeterminate etiology. Major intracranial vascular structures are patent. No evidence of mesial temporal sclerosis. Opacification ethmoid sinus air cells bilaterally. Minimal mucosal thickening right frontal sinus. Minimal mucosal thickening maxillary sinuses more notable on the right. Post lens replacement without acute orbital abnormality. Top-normal size pituitary gland. Cervical medullary junction unremarkable. IMPRESSION: No acute infarct or intracranial hemorrhage. Minimal nonspecific white matter changes  consistent with chronic microvascular changes. Mild global atrophy without hydrocephalus. Nonspecific 2.6 x 3.7 x 1.1 cm left occipital region subgaleal/subcutaneous mass with bony remodeling/thinning similar to prior exam of indeterminate etiology. Opacification ethmoid sinus air cells bilaterally. Minimal mucosal thickening right frontal sinus. Minimal mucosal thickening maxillary sinuses more notable on the right. Electronically Signed   By: Genia Del M.D.   On: 05/26/2016 16:49   Ct Angio Chest/abd/pel For Dissection W And/or W/wo  05/26/2016  CLINICAL DATA:  Widened mediastinum on chest radiographs earlier today. No known injury. Altered mental status for 1 day. Ex-smoker. History of hypertension. EXAM: CT ANGIOGRAPHY CHEST, ABDOMEN AND PELVIS TECHNIQUE: Multidetector CT imaging through the chest, abdomen and pelvis was performed using the standard protocol during bolus administration of intravenous contrast. Multiplanar reconstructed images and MIPs were obtained and reviewed to evaluate the vascular anatomy. CONTRAST:  100 cc Isovue 370 COMPARISON:  Previous chest radiographs, including earlier today. FINDINGS: CTA CHEST FINDINGS Mediastinum: Normal appearing thoracic aorta without aneurysm or dissection. No enlarged lymph nodes. Normal sized heart. There is prominent mediastinal and epicardial fat, simulating cardiomegaly and causing the widened appearance of the mediastinum on the recent chest radiographs. No mediastinal mass or adenopathy. Normally opacified pulmonary arteries with no pulmonary arterial filling defects. Lungs and pleura: Mild bilateral dependent atelectasis. No lung nodules or pleural fluid. Musculoskeletal:  Normal appearing bones. Review of the MIP images confirms the above findings. CTA ABDOMEN AND PELVIS FINDINGS Hepatobiliary: Normal appearing liver and gallbladder. Pancreas: No mass, inflammatory changes, or other significant abnormality. Spleen: Within normal limits in size  and appearance. Adrenals/Urinary Tract: Normal appearing adrenal glands, kidneys, ureters and urinary bladder. No urinary tract calculi or hydronephrosis. Stomach/Bowel: No gastrointestinal abnormalities. No evidence of appendicitis. Vascular/Lymphatic: Normally opacified arteries and veins with no significant atheromatous changes seen. No aneurysm or dissection. No enlarged lymph nodes. Reproductive: Surgically absent uterus.  Normal appearing ovaries. Other: None. Musculoskeletal:  Unremarkable bones. Review of the MIP images confirms the above findings. IMPRESSION: 1. No aortic dissection or aneurysm. 2. No mediastinal mass or adenopathy. The recently demonstrate widening of the mediastinum was due to mediastinal fat. 3. Normal sized heart. The recently suggested cardiomegaly  was due to prominent epicardial fat. 4. No acute abnormality in the chest, abdomen or pelvis. Electronically Signed   By: Claudie Revering M.D.   On: 05/26/2016 14:36       Assessment/Plan:   Tricia Huynh is an 60 y.o. female with history of uncontrolled diabetes, chronic kidney disease, hypertension, seizure disorder secondary to mild generalized atrophy or mild cognitive impairment presented with altered mental status. Patient presented with altered mental status and was found to have hypoglycemia probably had a seizure also. Now almost back to baseline. In the emergency room she was treated with Keppra 1 g IV 1. Findings discussed in detail with the patient and the family. Continue symptomatic measures. Will increase her Keppra to 750 twice a day  Please feel free to call neurology with any questions or concerns

## 2016-05-26 NOTE — ED Notes (Signed)
Pt here from home called em s out for aloc .  First cbg was 31 , pt received 1 amp of D50 cbg up to 254 but pt is not answering questions and is c/o bil leg pain

## 2016-05-26 NOTE — ED Provider Notes (Signed)
CSN: [id number]     Arrival date & time 05/26/16  1159 History   First MD Initiated Contact with Patient 05/26/16 1204     Chief Complaint  Patient presents with  . Altered Mental Status     (Consider location/radiation/quality/duration/timing/severity/associated sxs/prior Treatment) HPI Comments: Patient with past medical history remarkable for hypertension, diabetes, hyperlipidemia, and seizure presents to the emergency department with chief complaint of altered mental status. She was found to be confused this morning by family members. They state that she did not recognize them, and was acting strange, not eating food. EMS was called, and initial CBG was 31. Patient was given 1 amp of D50, and CBG increased to 254.  Patient still has not yet returned to baseline. There was no witnessed seizure, though this is not ruled out. The patient's family member states that she has not been eating normally, and also that she was out in the sun for an extended period of time yesterday having gone on a long walk.  Level V caveat applies secondary to altered mental status.  The history is provided by the patient. No language interpreter was used.    Past Medical History  Diagnosis Date  . Diabetes mellitus without complication ([place])   . Hypertension   . Mood disorder ([place]) 06/06/2015  . Essential hypertension 06/06/2015  . Dyslipidemia 06/06/2015  . Diabetic neuropathy ([place]) 06/06/2015  . Diabetes mellitus type 2 in nonobese ([place]) 06/06/2015  . Encephalopathy   . Seizure [facility])     sees [name]. abd eeg, on keppra   Past Surgical History  Procedure Laterality Date  . Abdominal hysterectomy    . Tubal ligation    . Eye surgery Bilateral    Family History  Problem Relation Age of Onset  . Heart disease Father   . Kidney disease Father   . Diabetes Mother   . Seizures Sister   . Seizures Brother    Social History  Substance Use Topics  . Smoking status: Former Research scientist (life sciences)  . Smokeless tobacco:  Never Used  . Alcohol Use: No   OB History    No data available     Review of Systems  Unable to perform ROS: Mental status change      Allergies  Percocet and Vicodin  Home Medications   Prior to Admission medications   Medication Sig Start Date End Date Taking? Authorizing Provider  amitriptyline (ELAVIL) 50 MG tablet Take 50 mg by mouth at bedtime. 05/08/15   Historical Provider, MD  amLODipine-olmesartan (AZOR) 5-40 MG per tablet Take 1 tablet by mouth daily. Reported on 01/02/2016    Historical Provider, MD  atorvastatin (LIPITOR) 40 MG tablet Take 40 mg by mouth daily. 05/10/15   Historical Provider, MD  Blood Glucose Monitoring Suppl (ACCU-CHEK AVIVA) device Use as instructed 01/08/16 01/07/17  Renato Shin, MD  gabapentin (NEURONTIN) 600 MG tablet Take 600 mg by mouth 2 (two) times daily.  10/08/14   Historical Provider, MD  glucose blood (ACCU-CHEK AVIVA) test strip 1 each by Other route 2 (two) times daily. And lancets 2/day 01/08/16   Renato Shin, MD  hydrochlorothiazide (HYDRODIURIL) 25 MG tablet Take 1 tablet (25 mg total) by mouth daily. 05/22/15   Hannah Muthersbaugh, PA-C  Insulin Glargine (LANTUS SOLOSTAR) 100 UNIT/ML Solostar Pen Inject 80 Units into the skin every morning. Patient taking differently: Inject 70 Units into the skin every morning.  01/18/16   Renato Shin, MD  levETIRAcetam (KEPPRA) 500 MG tablet One in the morning,  2 tabs at night 02/05/16   Marcial Pacas, MD  metoprolol succinate (TOPROL-XL) 25 MG 24 hr tablet Take 50 mg by mouth at bedtime. 05/08/15   Historical Provider, MD  sucralfate (CARAFATE) 1 G tablet Take 1 tablet (1 g total) by mouth 4 (four) times daily -  with meals and at bedtime. 10/18/14   Blanchie Dessert, MD   BP 171/107 mmHg  Pulse 90  Temp(Src) 97.7 F (36.5 C) (Oral)  Resp 16  SpO2 97% Physical Exam  Constitutional: She appears well-developed and well-nourished.  HENT:  Head: Normocephalic and atraumatic.  Eyes: Conjunctivae and EOM  are normal. Pupils are equal, round, and reactive to light.  Neck: Normal range of motion. Neck supple.  Cardiovascular: Normal rate and regular rhythm.  Exam reveals no gallop and no friction rub.   No murmur heard. Pulmonary/Chest: Effort normal and breath sounds normal. No respiratory distress. She has no wheezes. She has no rales. She exhibits no tenderness.  Abdominal: Soft. Bowel sounds are normal. She exhibits no distension and no mass. There is no tenderness. There is no rebound and no guarding.  Musculoskeletal: Normal range of motion. She exhibits no edema or tenderness.  Neurological:  GCS 9  Skin: Skin is warm and dry.  Psychiatric: She has a normal mood and affect. Her behavior is normal. Judgment and thought content normal.  Nursing note and vitals reviewed.   ED Course  Procedures (including critical care time) Results for orders placed or performed during the hospital encounter of 05/26/16  CBC with Differential/Platelet  Result Value Ref Range   WBC 6.7 4.0 - 10.5 K/uL   RBC 3.43 (L) 3.87 - 5.11 MIL/uL   Hemoglobin 9.8 (L) 12.0 - 15.0 g/dL   HCT 30.4 (L) 36.0 - 46.0 %   MCV 88.6 78.0 - 100.0 fL   MCH 28.6 26.0 - 34.0 pg   MCHC 32.2 30.0 - 36.0 g/dL   RDW 12.8 11.5 - 15.5 %   Platelets 264 150 - 400 K/uL   Neutrophils Relative % 80 %   Neutro Abs 5.3 1.7 - 7.7 K/uL   Lymphocytes Relative 15 %   Lymphs Abs 1.0 0.7 - 4.0 K/uL   Monocytes Relative 5 %   Monocytes Absolute 0.3 0.1 - 1.0 K/uL   Eosinophils Relative 0 %   Eosinophils Absolute 0.0 0.0 - 0.7 K/uL   Basophils Relative 0 %   Basophils Absolute 0.0 0.0 - 0.1 K/uL  Comprehensive metabolic panel  Result Value Ref Range   Sodium 139 135 - 145 mmol/L   Potassium 3.7 3.5 - 5.1 mmol/L   Chloride 108 101 - 111 mmol/L   CO2 25 22 - 32 mmol/L   Glucose, Bld 183 (H) 65 - 99 mg/dL   BUN 12 6 - 20 mg/dL   Creatinine, Ser 1.09 (H) 0.44 - 1.00 mg/dL   Calcium 9.1 8.9 - 10.3 mg/dL   Total Protein 7.6 6.5 - 8.1  g/dL   Albumin 3.5 3.5 - 5.0 g/dL   AST 31 15 - 41 U/L   ALT 36 14 - 54 U/L   Alkaline Phosphatase 165 (H) 38 - 126 U/L   Total Bilirubin 0.5 0.3 - 1.2 mg/dL   GFR calc non Af Amer 54 (L) >60 mL/min   GFR calc Af Amer >60 >60 mL/min   Anion gap 6 5 - 15  Ethanol  Result Value Ref Range   Alcohol, Ethyl (B) <5 <5 mg/dL  CBG monitoring, ED  Result Value Ref Range   Glucose-Capillary 116 (H) 65 - 99 mg/dL  I-stat chem 8, ed  Result Value Ref Range   Sodium 140 135 - 145 mmol/L   Potassium 3.8 3.5 - 5.1 mmol/L   Chloride 105 101 - 111 mmol/L   BUN 13 6 - 20 mg/dL   Creatinine, Ser 1.00 0.44 - 1.00 mg/dL   Glucose, Bld 181 (H) 65 - 99 mg/dL   Calcium, Ion 1.19 1.12 - 1.23 mmol/L   TCO2 26 0 - 100 mmol/L   Hemoglobin 9.9 (L) 12.0 - 15.0 g/dL   HCT 29.0 (L) 36.0 - 46.0 %  I-stat troponin, ED  Result Value Ref Range   Troponin i, poc 0.00 0.00 - 0.08 ng/mL   Comment 3          CBG monitoring, ED  Result Value Ref Range   Glucose-Capillary 123 (H) 65 - 99 mg/dL   Dg Chest 2 View  05/26/2016  CLINICAL DATA:  Altered level of consciousness for 1 day. Ex-smoker. History of hypertension. EXAM: CHEST  2 VIEW COMPARISON:  Chest x-rays dated 12/25/2015 and 09/18/2015. FINDINGS: Mediastinum is widened, a significant change compared to previous exams. Heart appears enlarged, also a change from previous exam. Lungs are clear. No evidence of pneumonia. No pleural effusion or pneumothorax seen. Osseous structures about the chest are unremarkable. IMPRESSION: Mediastinum is widened and heart appears enlarged, both representing a significant change compared to previous exams. Mediastinal mass, lymphadenopathy, aneurysm and pericardial effusion are all possibilities and chest CT is recommended for further characterization. These results were called by telephone at the time of interpretation on 05/26/2016 at 1:26 pm to Dr. Montine Circle , who verbally acknowledged these results. Electronically Signed    By: Franki Cabot M.D.   On: 05/26/2016 13:26   Ct Head Wo Contrast  05/26/2016  CLINICAL DATA:  Patient non-verbal nods her head to having a headache. Low blood sugar Question if any trauma EXAM: CT HEAD WITHOUT CONTRAST TECHNIQUE: Contiguous axial images were obtained from the base of the skull through the vertex without intravenous contrast. COMPARISON:  11/11/2015 FINDINGS: The ventricles are normal in configuration. There is mild ventricular and sulcal enlargement reflecting mild atrophy, stable. There are no parenchymal masses or mass effect. There is no evidence of an infarct. There are no extra-axial masses or abnormal fluid collections. There is no intracranial hemorrhage. Homogeneous circumscribed low-attenuation subgaleal lesion with associated remodeling of the parietal bone on the left is stable. Moderate ethmoid sinus mucosal thickening.  Clear mastoid air cells. IMPRESSION: 1. No acute intracranial abnormalities. No change from the prior study. Electronically Signed   By: Lajean Manes M.D.   On: 05/26/2016 13:27   Ct Angio Chest/abd/pel For Dissection W And/or W/wo  05/26/2016  CLINICAL DATA:  Widened mediastinum on chest radiographs earlier today. No known injury. Altered mental status for 1 day. Ex-smoker. History of hypertension. EXAM: CT ANGIOGRAPHY CHEST, ABDOMEN AND PELVIS TECHNIQUE: Multidetector CT imaging through the chest, abdomen and pelvis was performed using the standard protocol during bolus administration of intravenous contrast. Multiplanar reconstructed images and MIPs were obtained and reviewed to evaluate the vascular anatomy. CONTRAST:  100 cc Isovue 370 COMPARISON:  Previous chest radiographs, including earlier today. FINDINGS: CTA CHEST FINDINGS Mediastinum: Normal appearing thoracic aorta without aneurysm or dissection. No enlarged lymph nodes. Normal sized heart. There is prominent mediastinal and epicardial fat, simulating cardiomegaly and causing the widened appearance  of the mediastinum on the recent chest radiographs.  No mediastinal mass or adenopathy. Normally opacified pulmonary arteries with no pulmonary arterial filling defects. Lungs and pleura: Mild bilateral dependent atelectasis. No lung nodules or pleural fluid. Musculoskeletal:  Normal appearing bones. Review of the MIP images confirms the above findings. CTA ABDOMEN AND PELVIS FINDINGS Hepatobiliary: Normal appearing liver and gallbladder. Pancreas: No mass, inflammatory changes, or other significant abnormality. Spleen: Within normal limits in size and appearance. Adrenals/Urinary Tract: Normal appearing adrenal glands, kidneys, ureters and urinary bladder. No urinary tract calculi or hydronephrosis. Stomach/Bowel: No gastrointestinal abnormalities. No evidence of appendicitis. Vascular/Lymphatic: Normally opacified arteries and veins with no significant atheromatous changes seen. No aneurysm or dissection. No enlarged lymph nodes. Reproductive: Surgically absent uterus.  Normal appearing ovaries. Other: None. Musculoskeletal:  Unremarkable bones. Review of the MIP images confirms the above findings. IMPRESSION: 1. No aortic dissection or aneurysm. 2. No mediastinal mass or adenopathy. The recently demonstrate widening of the mediastinum was due to mediastinal fat. 3. Normal sized heart. The recently suggested cardiomegaly was due to prominent epicardial fat. 4. No acute abnormality in the chest, abdomen or pelvis. Electronically Signed   By: Claudie Revering M.D.   On: 05/26/2016 14:36    I have personally reviewed and evaluated these images and lab results as part of my medical decision-making.   EKG Interpretation   Date/Time:  Monday May 26 2016 12:17:58 EDT Ventricular Rate:  85 PR Interval:    QRS Duration: 87 QT Interval:  398 QTC Calculation: 474 R Axis:   12 Text Interpretation:  Sinus rhythm Premature ventricular complexes T wave  abnormality Abnormal ekg Confirmed by Carmin Muskrat  MD  [id number]) on  05/26/2016 12:36:12 PM      MDM   Final diagnoses:  Altered mental status, unspecified altered mental status type    Patient with AMS.  GCS 9.  Hypoglycemic by EMS, will monitor this closely.  Also has seizure hx on keppra.  Could be post-ictal.  Patient seen by and discussed with Dr. Vanita Panda, who agrees with workup plan.  VSS.  Afebrile.  1:29 PM Received phone call from radiology, CXR shows new widened mediastinum and enlarged heart.  Radiology recommends CT chest.  Discussed with Dr. Vanita Panda and RN.  Patient taken to CT immediately.  Last Cr from a year ago was 1.1.  I advised getting CT now rather than waiting on labs given the acuity of patient's condition.  CT angio negative.  Widened mediastinum 2/2 fat.  Dr. Vanita Panda recommends admission for AMS.  Discussed with Dr. Charlies Silvers, who will admit and place orders.  Discussed patient with neurology who will see patient in consultation and recommends MRI, Keppra, and rectal aspirin if not swallowing.  Montine Circle, PA-C 05/26/16 [place], MD 05/28/16 [phone]

## 2016-05-26 NOTE — Progress Notes (Signed)
Patient arrived to 5M12 from ED at this time alert and pleasant accompanied by sister. Safety precautions and orders reviewed with pt. VSS. TELE applied and confirmed. sz pads placed. Pt denied any distress except a little diziness.  No diet order . MD paged. Dr.Devine called back for pt to received regular diet.No other complaints at this time. Will continue to monitor.    Ave Filter, RN

## 2016-05-26 NOTE — Progress Notes (Signed)
Dr. Marlon Pel states scan without recent creatinine and GFR

## 2016-05-26 NOTE — H&P (Addendum)
History and Physical    Tricia Huynh [id number] DOB: 04-11-1956 DOA: 05/26/2016  Referring MD/NP/PA: Lorre Munroe   PCP: Lilian Coma, MD   Outpatient Specialists:   Patient coming from: home   Chief Complaint: altered mental status, confusion   HPI: Tricia Huynh is a 60 y.o. female with medical history significant for hypertension, seizures, diabetes and related to diabetic neuropathy (last A1c in 11/2015 was 11.8), CKD stage 3 with baseline Cr 1.32 (05/2015), dyslipidemia who presented from home to [facility] ED for evaluation of altered mental status and confusion noted this morning prior to this admission. Pt is not able to provide th e details of medical history and her family at the bedside said she apparently was not able to recognize them this am. She could not take care of herself, eat or dress herself. EMS was called and her CBG at that time was 31. After 1 amp of D50 CBG improved to 250 range.  In ED, she is still altered and not verbal. No reports of falls. No reports of loss of consciousness. No reports of seizures.    ED Course: BP 133/89 - 171/107, HR 78, RR 11-22, afebrile, normal oxygen saturation. Blood work showed hemoglobin of 9.9, creatinine 1.09 (baseline 1.32 in 05/2015). CXR showed mediastinal mass, lymphadenopathy, aneurysm and pericardial effusion all are possibilities and chest CT was recommended for further characterization. CT head showed no acute intracranial findings. CT angio chest showed no aortic dissection or aneurysm, no mediastinal mass or adenopathy, normal size heart.  Review of Systems:  Unable to obtain due to altered mental status.  Past Medical History  Diagnosis Date  . Diabetes mellitus without complication ([place])   . Hypertension   . Mood disorder ([place]) 06/06/2015  . Essential hypertension 06/06/2015  . Dyslipidemia 06/06/2015  . Diabetic neuropathy ([place]) 06/06/2015  . Diabetes mellitus type 2 in nonobese ([place]) 06/06/2015  .  Encephalopathy   . Seizure [facility])     sees [name]. abd eeg, on keppra    Past Surgical History  Procedure Laterality Date  . Abdominal hysterectomy    . Tubal ligation    . Eye surgery Bilateral     Social history:  reports that she quit smoking. She has never used smokeless tobacco. She reports that she does not drink alcohol or use illicit drugs.  Ambulation walking without assistance at baseline   Allergies  Allergen Reactions  . Percocet [Oxycodone-Acetaminophen] Nausea And Vomiting and Other (See Comments)    Shaky/unsteady.  . Vicodin [Hydrocodone-Acetaminophen] Nausea And Vomiting and Other (See Comments)    Shaky/unsteady.    Family History  Problem Relation Age of Onset  . Heart disease Father   . Kidney disease Father   . Diabetes Mother   . Seizures Sister   . Seizures Brother     Prior to Admission medications   Medication Sig Start Date End Date Taking? Authorizing Provider  amitriptyline (ELAVIL) 50 MG tablet Take 50 mg by mouth at bedtime. 05/08/15   Historical Provider, MD  amLODipine-olmesartan (AZOR) 5-40 MG per tablet Take 1 tablet by mouth daily. Reported on 01/02/2016    Historical Provider, MD  atorvastatin (LIPITOR) 40 MG tablet Take 40 mg by mouth daily. 05/10/15   Historical Provider, MD  Blood Glucose Monitoring Suppl (ACCU-CHEK AVIVA) device Use as instructed 01/08/16 01/07/17  Renato Shin, MD  gabapentin (NEURONTIN) 600 MG tablet Take 600 mg by mouth 2 (two) times daily.  10/08/14   Historical Provider, MD  glucose blood (ACCU-CHEK AVIVA) test strip 1 each by Other route 2 (two) times daily. And lancets 2/day 01/08/16   Renato Shin, MD  hydrochlorothiazide (HYDRODIURIL) 25 MG tablet Take 1 tablet (25 mg total) by mouth daily. 05/22/15   Hannah Muthersbaugh, PA-C  Insulin Glargine (LANTUS SOLOSTAR) 100 UNIT/ML Solostar Pen Inject 80 Units into the skin every morning. Patient taking differently: Inject 70 Units into the skin every morning.  01/18/16   Renato Shin, MD  levETIRAcetam (KEPPRA) 500 MG tablet One in the morning, 2 tabs at night 02/05/16   Marcial Pacas, MD  metoprolol succinate (TOPROL-XL) 25 MG 24 hr tablet Take 50 mg by mouth at bedtime. 05/08/15   Historical Provider, MD  sucralfate (CARAFATE) 1 G tablet Take 1 tablet (1 g total) by mouth 4 (four) times daily -  with meals and at bedtime. 10/18/14   Blanchie Dessert, MD    Physical Exam: Filed Vitals:   05/26/16 1415 05/26/16 1430 05/26/16 1435 05/26/16 1445  BP: 144/95 151/102 151/102 141/110  Pulse: 82 81 83 81  Temp:      TempSrc:      Resp: 12 13 13 22   SpO2: 100% 100% 100% 100%    Constitutional: NAD, calm, comfortable Filed Vitals:   05/26/16 1415 05/26/16 1430 05/26/16 1435 05/26/16 1445  BP: 144/95 151/102 151/102 141/110  Pulse: 82 81 83 81  Temp:      TempSrc:      Resp: 12 13 13 22   SpO2: 100% 100% 100% 100%   Eyes: PERRL, lids and conjunctivae normal ENMT: Mucous membranes are moist. Posterior pharynx clear of any exudate or lesions.Normal dentition.  Neck: normal, supple, no masses, no thyromegaly Respiratory: clear to auscultation bilaterally, no wheezing, no crackles. Normal respiratory effort. No accessory muscle use.  Cardiovascular: Regular rate and rhythm, no murmurs / rubs / gallops. No extremity edema. 2+ pedal pulses. No carotid bruits.  Abdomen: no tenderness, no masses palpated. No hepatosplenomegaly. Bowel sounds positive.  Musculoskeletal: no clubbing / cyanosis. No joint deformity upper and lower extremities. Good ROM, no contractures. Normal muscle tone.  Skin: no rashes, lesions, ulcers. No induration Neurologic: Unable to fully assess due to altered mental status, pt does not follow commands   Psychiatric: Normal judgment and insight. Alert and oriented x 3. Normal mood.    Labs on Admission: I have personally reviewed following labs and imaging studies  CBC:  Recent Labs Lab 05/26/16 1315 05/26/16 1338  WBC 6.7  --   NEUTROABS 5.3   --   HGB 9.8* 9.9*  HCT 30.4* 29.0*  MCV 88.6  --   PLT 264  --    Basic Metabolic Panel:  Recent Labs Lab 05/26/16 1315 05/26/16 1338  NA 139 140  K 3.7 3.8  CL 108 105  CO2 25  --   GLUCOSE 183* 181*  BUN 12 13  CREATININE 1.09* 1.00  CALCIUM 9.1  --    GFR: CrCl cannot be calculated (Unknown ideal weight.). Liver Function Tests:  Recent Labs Lab 05/26/16 1315  AST 31  ALT 36  ALKPHOS 165*  BILITOT 0.5  PROT 7.6  ALBUMIN 3.5   No results for input(s): LIPASE, AMYLASE in the last 168 hours. No results for input(s): AMMONIA in the last 168 hours. Coagulation Profile: No results for input(s): INR, PROTIME in the last 168 hours. Cardiac Enzymes: No results for input(s): CKTOTAL, CKMB, CKMBINDEX, TROPONINI in the last 168 hours. BNP (last 3 results) No results for  input(s): PROBNP in the last 8760 hours. HbA1C: No results for input(s): HGBA1C in the last 72 hours. CBG:  Recent Labs Lab 05/26/16 1212 05/26/16 1316  GLUCAP 116* 123*   Lipid Profile: No results for input(s): CHOL, HDL, LDLCALC, TRIG, CHOLHDL, LDLDIRECT in the last 72 hours. Thyroid Function Tests: No results for input(s): TSH, T4TOTAL, FREET4, T3FREE, THYROIDAB in the last 72 hours. Anemia Panel: No results for input(s): VITAMINB12, FOLATE, FERRITIN, TIBC, IRON, RETICCTPCT in the last 72 hours. Urine analysis:    Component Value Date/Time   COLORURINE YELLOW 12/26/2015 0135   APPEARANCEUR CLOUDY* 12/26/2015 0135   LABSPEC 1.015 12/26/2015 0135   PHURINE 5.5 12/26/2015 0135   GLUCOSEU 500* 12/26/2015 0135   HGBUR NEGATIVE 12/26/2015 0135   BILIRUBINUR NEGATIVE 12/26/2015 0135   KETONESUR NEGATIVE 12/26/2015 0135   PROTEINUR NEGATIVE 12/26/2015 0135   UROBILINOGEN 0.2 09/18/2015 1327   NITRITE NEGATIVE 12/26/2015 0135   LEUKOCYTESUR MODERATE* 12/26/2015 0135   Sepsis Labs: @LABRCNTIP (procalcitonin:4,lacticidven:4) )No results found for this or any previous visit (from the past 240  hour(s)).   Radiological Exams on Admission: Dg Chest 2 View 05/26/2016  Mediastinum is widened and heart appears enlarged, both representing a significant change compared to previous exams. Mediastinal mass, lymphadenopathy, aneurysm and pericardial effusion are all possibilities and chest CT is recommended for further characterization. These results were called by telephone at the time of interpretation on 05/26/2016 at 1:26 pm to Dr. Montine Circle , who verbally acknowledged these results. Electronically Signed   By: Franki Cabot M.D.   On: 05/26/2016 13:26   Ct Head Wo Contrast 05/26/2016   1. No acute intracranial abnormalities. No change from the prior study. Electronically Signed   By: Lajean Manes M.D.   On: 05/26/2016 13:27   Ct Angio Chest/abd/pel For Dissection W And/or W/wo 05/26/2016  1. No aortic dissection or aneurysm. 2. No mediastinal mass or adenopathy. The recently demonstrate widening of the mediastinum was due to mediastinal fat. 3. Normal sized heart. The recently suggested cardiomegaly was due to prominent epicardial fat. 4. No acute abnormality in the chest, abdomen or pelvis. Electronically Signed   By: Claudie Revering M.D.   On: 05/26/2016 14:36    EKG: Independently reviewed. Sinus rhythm  Assessment/Plan  Principal Problem:   Acute encephalopathy / Seizures (HCC) - Per neuro, load with keppra 1000 mg IV one time dose - Resume keppra per home regimen, IV route until mental status better - Obtain MRI brain and EEG per neuro recommendations - UDS and ethanol WNL - Monitor on telemetry unit   Active Problems:   Diabetic neuropathy (HCC) - Resume gabapentin once able to take PO    Memory loss - History of memory loss    CKD (chronic kidney disease) stage 3, GFR 30-59 ml/min - Baseline Cr 1.32 in 05/2015 - Cr 1.09 on this admission, within baseline range    Uncontrolled diabetes mellitus with diabetic nephropathy, with long-term current use of insulin (HCC) -  Last A1c in 11/2015 was 11.8 indicating poor glycemic control - Resume Lantus half the home dose (70 U --> 35) - Add SSI    Benign essential HTN - Resume Norvasc and metoprolol    Dyslipidemia associated with type 2 diabetes mellitus (HCC) - Resume statin therapy       DVT prophylaxis: SCD's bilaterally  Code Status: full code  Family Communication: daughter at the bedside  Disposition Plan: admission to telemetry unit to monitor for seizures  Consults called: neurology  Admission status: inpatient   Leisa Lenz MD [facility] Pager [phone]  If 7PM-7AM, please contact night-coverage [email or web address] Password [facility]  05/26/2016, 3:42 PM

## 2016-05-26 NOTE — ED Notes (Signed)
Pt in MRI- will transport to floor upon arrival back to ED.

## 2016-05-27 ENCOUNTER — Inpatient Hospital Stay (HOSPITAL_COMMUNITY)
Admit: 2016-05-27 | Discharge: 2016-05-27 | Disposition: A | Discharge status: Home / Self Care | Payer: Medicare Other | Attending: Internal Medicine | Admitting: Internal Medicine

## 2016-05-27 DIAGNOSIS — R4182 Altered mental status, unspecified: Secondary | ICD-10-CM

## 2016-05-27 DIAGNOSIS — E0849 Diabetes mellitus due to underlying condition with other diabetic neurological complication: Secondary | ICD-10-CM

## 2016-05-27 DIAGNOSIS — G9340 Encephalopathy, unspecified: Secondary | ICD-10-CM | POA: Diagnosis not present

## 2016-05-27 DIAGNOSIS — I10 Essential (primary) hypertension: Secondary | ICD-10-CM | POA: Diagnosis not present

## 2016-05-27 DIAGNOSIS — E11649 Type 2 diabetes mellitus with hypoglycemia without coma: Secondary | ICD-10-CM | POA: Diagnosis not present

## 2016-05-27 LAB — HEMOGLOBIN A1C
Hgb A1c MFr Bld: 8.7 % — ABNORMAL HIGH (ref 4.8–5.6)
Mean Plasma Glucose: 203 mg/dL

## 2016-05-27 LAB — GLUCOSE, CAPILLARY
Glucose-Capillary: 43 mg/dL — CL (ref 65–99)
Glucose-Capillary: 109 mg/dL — ABNORMAL HIGH (ref 65–99)
Glucose-Capillary: 92 mg/dL (ref 65–99)
Glucose-Capillary: 106 mg/dL — ABNORMAL HIGH (ref 65–99)
Glucose-Capillary: 119 mg/dL — ABNORMAL HIGH (ref 65–99)
Glucose-Capillary: 135 mg/dL — ABNORMAL HIGH (ref 65–99)

## 2016-05-27 MED ORDER — INSULIN GLARGINE 100 UNIT/ML ~~LOC~~ SOLN
15.0000 [IU] | Freq: Every day | SUBCUTANEOUS | Status: DC
  Administered 2016-05-27 – 2016-05-28 (×2): 15 [IU] via SUBCUTANEOUS
  Filled 2016-05-27 (×2): qty 0.15

## 2016-05-27 MED ORDER — INSULIN GLARGINE 100 UNIT/ML ~~LOC~~ SOLN: 20.0000 [IU] | Freq: Every day | SUBCUTANEOUS | Status: DC

## 2016-05-27 MED ORDER — INSULIN ASPART 100 UNIT/ML ~~LOC~~ SOLN: 0.0000 [IU] | Freq: Every day | SUBCUTANEOUS | Status: DC

## 2016-05-27 MED ORDER — ENOXAPARIN SODIUM 40 MG/0.4ML ~~LOC~~ SOLN
40.0000 mg | SUBCUTANEOUS | Status: DC
  Administered 2016-05-27 – 2016-05-28 (×2): 40 mg via SUBCUTANEOUS
  Filled 2016-05-27 (×2): qty 0.4

## 2016-05-27 MED ORDER — ATORVASTATIN CALCIUM 40 MG PO TABS
40.0000 mg | ORAL_TABLET | Freq: Every day | ORAL | Status: DC
  Administered 2016-05-27 – 2016-05-28 (×2): 40 mg via ORAL
  Filled 2016-05-27 (×2): qty 1

## 2016-05-27 MED ORDER — DEXTROSE 50 % IV SOLN
INTRAVENOUS | Status: AC
  Administered 2016-05-27: 25 mL
  Filled 2016-05-27: qty 50

## 2016-05-27 MED ORDER — INSULIN ASPART 100 UNIT/ML ~~LOC~~ SOLN
0.0000 [IU] | Freq: Three times a day (TID) | SUBCUTANEOUS | Status: DC
  Administered 2016-05-28: 1 [IU] via SUBCUTANEOUS

## 2016-05-27 MED ORDER — INSULIN GLARGINE 100 UNIT/ML ~~LOC~~ SOLN
20.0000 [IU] | Freq: Every day | SUBCUTANEOUS | Status: DC
  Filled 2016-05-27: qty 0.2

## 2016-05-27 NOTE — Progress Notes (Signed)
PROGRESS NOTE  Tricia Huynh [id number] DOB: June 25, 1956 DOA: 05/26/2016 PCP: Lilian Coma, MD  Brief History:  59 year old female with a history of hypertension, seizure disorder, CKD stage III, diabetes mellitus with diabetic neuropathy presented from home with onset of acute mental status/confusion on the morning of 05/26/2016. Around 80 on 05/26/2016, the patient was noted to be laying in bed moaning and grunting. Around 10 AM, her grandson came to wake her up, and the patient was noted to be confused and moaning with inability to recognize her family. Apparently, the patient was nonverbal in the emergency department and history was obtained from the patient's family. According to the family, the patient had been in her usual state of health. EMS was activated and her CBG was noted to be 31. The patient was given D50. However in the emergency department, the patient remained encephalopathic and nonverbal. There was no reports of tonic-clonic activity or loss of consciousness. Since admission to the hospital and arrival in the medical floor, the patient's mental status has returned to baseline. Neurology was consulted and felt that the patient may have had a seizure secondary to her hypoglycemia.  Patient states that there has been no changes in her insulin regimen for the past 3 weeks.  Assessment/Plan: Acute encephalopathy/ -Secondary to hypoglycemia and subsequent seizure -Appreciate neurology consultation -Patient loaded on Keppra -Continue Keppra 750 mg twice a day -05/26/2016 MRI brain negative for hemorrhage or infarct. Unchanged left occipital subcutaneous mass with bone remodeling -EEG -Urinalysis is negative for pyuria -Urine drug screen negative -personally reviewed EKG--sinus, non specific ST changes  Seizure/Hypoglycemia -precipitated by hypoglycemia -pt only eating one meal daily -hx of poor compliance per Dr. Cordelia Pen last note although pt presently  states she is faithful to her insulin -consult nutrition  Diabetes mellitus type 2 with neuropathy -Hemoglobin A1c--8.7 -Continue reduced dose Lantus -NovoLog sliding scale -Advance diet -Continue gabapentin -poor health literacy/poor insight into illness  Cognitive impairment -will need outpatient neurology follow-up  Hypertension -Continue amlodipine, irbesartan, metoprolol succinate, HCTZ   CKD stage II-III  -Baseline creatinine 0.8-1.1  Hyperlipidemia  -Restart atorvastatin    Disposition Plan:   Home 7/11 or 7/12  Family Communication:   Sister at bedside updated--Total time spent 35 minutes.  Greater than 50% spent face to face counseling and coordinating care.   Consultants:  Neurology  Code Status:  FULL  DVT Prophylaxis:  [place] Heparin / [place] Lovenox   Procedures: As Listed in Progress Note Above  Antibiotics: None    Subjective: Patient denies fevers, chills, headache, chest pain, dyspnea, nausea, vomiting, diarrhea, abdominal pain, dysuria, hematuria   Objective: Filed Vitals:   05/26/16 1530 05/26/16 1711 05/26/16 2300 05/27/16 0426  BP: 143/94 140/86 141/83 130/79  Pulse: 78 78 71 88  Temp:  98 F (36.7 C) 98.5 F (36.9 C) 98.1 F (36.7 C)  TempSrc:  Oral Oral Oral  Resp: 11 16 18 20   SpO2: 100% 100% 100% 100%    Intake/Output Summary (Last 24 hours) at 05/27/16 0959 Last data filed at 05/27/16 0815  Gross per 24 hour  Intake 1176.25 ml  Output      0 ml  Net 1176.25 ml   Weight change:  Exam:   General:  Pt is alert, follows commands appropriately, not in acute distress  HEENT: No icterus, No thrush, No neck mass, [place]/AT  Cardiovascular: RRR, S1/S2, no rubs, no gallops  Respiratory: CTA bilaterally, no wheezing, no  crackles, no rhonchi  Abdomen: Soft/+BS, non tender, non distended, no guarding  Extremities: No edema, No lymphangitis, No petechiae, No rashes, no synovitis   Data Reviewed: I have personally reviewed following  labs and imaging studies Basic Metabolic Panel:  Recent Labs Lab 05/26/16 1315 05/26/16 1338 05/26/16 1802  NA 139 140  --   K 3.7 3.8  --   CL 108 105  --   CO2 25  --   --   GLUCOSE 183* 181*  --   BUN 12 13  --   CREATININE 1.09* 1.00  --   CALCIUM 9.1  --   --   MG  --   --  2.3   Liver Function Tests:  Recent Labs Lab 05/26/16 1315  AST 31  ALT 36  ALKPHOS 165*  BILITOT 0.5  PROT 7.6  ALBUMIN 3.5   No results for input(s): LIPASE, AMYLASE in the last 168 hours. No results for input(s): AMMONIA in the last 168 hours. Coagulation Profile: No results for input(s): INR, PROTIME in the last 168 hours. CBC:  Recent Labs Lab 05/26/16 1315 05/26/16 1338 05/26/16 1802  WBC 6.7  --  6.6  NEUTROABS 5.3  --   --   HGB 9.8* 9.9* 10.2*  HCT 30.4* 29.0* 31.2*  MCV 88.6  --  87.9  PLT 264  --  255   Cardiac Enzymes: No results for input(s): CKTOTAL, CKMB, CKMBINDEX, TROPONINI in the last 168 hours. BNP: Invalid input(s): POCBNP CBG:  Recent Labs Lab 05/26/16 1642 05/26/16 2111 05/27/16 0504 05/27/16 0534 05/27/16 0610  GLUCAP 76 88 43* 92 109*   HbA1C:  Recent Labs  05/26/16 1802  HGBA1C 8.7*   Urine analysis:    Component Value Date/Time   COLORURINE YELLOW 05/26/2016 1451   APPEARANCEUR CLEAR 05/26/2016 1451   LABSPEC 1.025 05/26/2016 1451   PHURINE 5.0 05/26/2016 1451   GLUCOSEU 100* 05/26/2016 1451   HGBUR NEGATIVE 05/26/2016 1451   BILIRUBINUR NEGATIVE 05/26/2016 1451   KETONESUR NEGATIVE 05/26/2016 1451   PROTEINUR NEGATIVE 05/26/2016 1451   UROBILINOGEN 0.2 09/18/2015 1327   NITRITE POSITIVE* 05/26/2016 1451   LEUKOCYTESUR NEGATIVE 05/26/2016 1451   Sepsis Labs: @LABRCNTIP (procalcitonin:4,lacticidven:4) )No results found for this or any previous visit (from the past 240 hour(s)).   Scheduled Meds: . amLODipine  5 mg Oral Daily   And  . irbesartan  300 mg Oral Daily  . aspirin  300 mg Rectal Once  . atorvastatin  40 mg Oral  q1800  . enoxaparin (LOVENOX) injection  40 mg Subcutaneous Q24H  . gabapentin  600 mg Oral BID  . hydrochlorothiazide  25 mg Oral Daily  . insulin aspart  0-15 Units Subcutaneous TID WC  . insulin aspart  0-5 Units Subcutaneous QHS  . [START ON 05/28/2016] insulin glargine  20 Units Subcutaneous Daily  . levETIRAcetam  750 mg Oral BID  . metoprolol succinate  50 mg Oral QHS  . sucralfate  1 g Oral TID WC & HS   Continuous Infusions: . sodium chloride 75 mL/hr (05/27/16 0806)    Procedures/Studies: Dg Chest 2 View  05/26/2016  CLINICAL DATA:  Altered level of consciousness for 1 day. Ex-smoker. History of hypertension. EXAM: CHEST  2 VIEW COMPARISON:  Chest x-rays dated 12/25/2015 and 09/18/2015. FINDINGS: Mediastinum is widened, a significant change compared to previous exams. Heart appears enlarged, also a change from previous exam. Lungs are clear. No evidence of pneumonia. No pleural effusion or pneumothorax seen. Osseous structures  about the chest are unremarkable. IMPRESSION: Mediastinum is widened and heart appears enlarged, both representing a significant change compared to previous exams. Mediastinal mass, lymphadenopathy, aneurysm and pericardial effusion are all possibilities and chest CT is recommended for further characterization. These results were called by telephone at the time of interpretation on 05/26/2016 at 1:26 pm to Dr. Montine Circle , who verbally acknowledged these results. Electronically Signed   By: Franki Cabot M.D.   On: 05/26/2016 13:26   Ct Head Wo Contrast  05/26/2016  CLINICAL DATA:  Patient non-verbal nods her head to having a headache. Low blood sugar Question if any trauma EXAM: CT HEAD WITHOUT CONTRAST TECHNIQUE: Contiguous axial images were obtained from the base of the skull through the vertex without intravenous contrast. COMPARISON:  11/11/2015 FINDINGS: The ventricles are normal in configuration. There is mild ventricular and sulcal enlargement  reflecting mild atrophy, stable. There are no parenchymal masses or mass effect. There is no evidence of an infarct. There are no extra-axial masses or abnormal fluid collections. There is no intracranial hemorrhage. Homogeneous circumscribed low-attenuation subgaleal lesion with associated remodeling of the parietal bone on the left is stable. Moderate ethmoid sinus mucosal thickening.  Clear mastoid air cells. IMPRESSION: 1. No acute intracranial abnormalities. No change from the prior study. Electronically Signed   By: Lajean Manes M.D.   On: 05/26/2016 13:27   Mr Brain Wo Contrast  05/26/2016  CLINICAL DATA:  60 year old hypertensive diabetic female with history seizures presenting with altered mental status. Confused. Blood sugar 31. Subsequent encounter. EXAM: MRI HEAD WITHOUT CONTRAST TECHNIQUE: Multiplanar, multiecho pulse sequences of the brain and surrounding structures were obtained without intravenous contrast. COMPARISON:  05/26/2016 head CT.  06/06/2015 brain MR. FINDINGS: No acute infarct or intracranial hemorrhage. Minimal nonspecific white matter changes consistent with chronic microvascular changes. Mild global atrophy without hydrocephalus. Nonspecific 2.6 x 3.7 x 1.1 cm left occipital region subgaleal/subcutaneous mass with bony remodeling/thinning similar to prior exam of indeterminate etiology. Major intracranial vascular structures are patent. No evidence of mesial temporal sclerosis. Opacification ethmoid sinus air cells bilaterally. Minimal mucosal thickening right frontal sinus. Minimal mucosal thickening maxillary sinuses more notable on the right. Post lens replacement without acute orbital abnormality. Top-normal size pituitary gland. Cervical medullary junction unremarkable. IMPRESSION: No acute infarct or intracranial hemorrhage. Minimal nonspecific white matter changes consistent with chronic microvascular changes. Mild global atrophy without hydrocephalus. Nonspecific 2.6 x 3.7 x  1.1 cm left occipital region subgaleal/subcutaneous mass with bony remodeling/thinning similar to prior exam of indeterminate etiology. Opacification ethmoid sinus air cells bilaterally. Minimal mucosal thickening right frontal sinus. Minimal mucosal thickening maxillary sinuses more notable on the right. Electronically Signed   By: Genia Del M.D.   On: 05/26/2016 16:49   Ct Angio Chest/abd/pel For Dissection W And/or W/wo  05/26/2016  CLINICAL DATA:  Widened mediastinum on chest radiographs earlier today. No known injury. Altered mental status for 1 day. Ex-smoker. History of hypertension. EXAM: CT ANGIOGRAPHY CHEST, ABDOMEN AND PELVIS TECHNIQUE: Multidetector CT imaging through the chest, abdomen and pelvis was performed using the standard protocol during bolus administration of intravenous contrast. Multiplanar reconstructed images and MIPs were obtained and reviewed to evaluate the vascular anatomy. CONTRAST:  100 cc Isovue 370 COMPARISON:  Previous chest radiographs, including earlier today. FINDINGS: CTA CHEST FINDINGS Mediastinum: Normal appearing thoracic aorta without aneurysm or dissection. No enlarged lymph nodes. Normal sized heart. There is prominent mediastinal and epicardial fat, simulating cardiomegaly and causing the widened appearance of the mediastinum on  the recent chest radiographs. No mediastinal mass or adenopathy. Normally opacified pulmonary arteries with no pulmonary arterial filling defects. Lungs and pleura: Mild bilateral dependent atelectasis. No lung nodules or pleural fluid. Musculoskeletal:  Normal appearing bones. Review of the MIP images confirms the above findings. CTA ABDOMEN AND PELVIS FINDINGS Hepatobiliary: Normal appearing liver and gallbladder. Pancreas: No mass, inflammatory changes, or other significant abnormality. Spleen: Within normal limits in size and appearance. Adrenals/Urinary Tract: Normal appearing adrenal glands, kidneys, ureters and urinary bladder. No  urinary tract calculi or hydronephrosis. Stomach/Bowel: No gastrointestinal abnormalities. No evidence of appendicitis. Vascular/Lymphatic: Normally opacified arteries and veins with no significant atheromatous changes seen. No aneurysm or dissection. No enlarged lymph nodes. Reproductive: Surgically absent uterus.  Normal appearing ovaries. Other: None. Musculoskeletal:  Unremarkable bones. Review of the MIP images confirms the above findings. IMPRESSION: 1. No aortic dissection or aneurysm. 2. No mediastinal mass or adenopathy. The recently demonstrate widening of the mediastinum was due to mediastinal fat. 3. Normal sized heart. The recently suggested cardiomegaly was due to prominent epicardial fat. 4. No acute abnormality in the chest, abdomen or pelvis. Electronically Signed   By: Claudie Revering M.D.   On: 05/26/2016 14:36    Jonah Gingras, DO  [facility] Pager [phone]  If 7PM-7AM, please contact night-coverage [email or web address] Password [email or web address] 05/27/2016, 9:59 AM   LOS: 1 day

## 2016-05-27 NOTE — Progress Notes (Signed)
Pt states feeling like blood sugar is low. Pt states feeling tired. CBG check of 43. D50 of 24ml was administered, recheck CBG of 93. Given graham cracker to maintain blood sugar. Pt states feeling better.  Will continue to monitor.

## 2016-05-27 NOTE — Procedures (Signed)
ELECTROENCEPHALOGRAM REPORT  Date of Study: 05/27/2016  Patient's Name: Tricia Huynh MRN: [id number] Date of Birth: 11/14/1956  Referring Provider: Leisa Lenz, MD  Indication: 60 year old female with a history of hypertension, seizure disorder, CKD stage III, diabetes mellitus with diabetic neuropathy presented from home with onset of acute mental status/confusion on the morning of 05/26/2016.  Since admission to the hospital and arrival in the medical floor, the patient's mental status has returned to baseline  Medications: amLODipine (NORVASC) tablet 5 mg   aspirin suppository 300 mg  atorvastatin (LIPITOR) tablet 40 mg  enoxaparin (LOVENOX) injection 40 mg  gabapentin (NEURONTIN) tablet 600 mg  hydrochlorothiazide (HYDRODIURIL) tablet 25 mg  insulin aspart (novoLOG) injection 0-15 Units  insulin aspart (novoLOG) injection 0-5 Units  insulin glargine (LANTUS) injection 20 Units L1 irbesartan (AVAPRO) tablet 300 mg  levETIRAcetam (KEPPRA) tablet 750 mg  LORazepam (ATIVAN) injection 1 mg  metoprolol succinate (TOPROL-XL) 24 hr tablet 50 mg L2 ondansetron (ZOFRAN) injection 4 mg L2 ondansetron (ZOFRAN) tablet 4 mg  sucralfate (CARAFATE) tablet 1 g  Technical Summary: This is a multichannel digital EEG recording, using the international 10-20 placement system with electrodes applied with paste and impedances below 5000 ohms.    Description: The EEG background is symmetric, with a well-developed posterior dominant rhythm of 8-9 Hz, which is reactive to eye opening and closing.  Diffuse beta activity is seen, with a bilateral frontal preponderance.  No focal or generalized abnormalities are seen.  No focal or generalized epileptiform discharges are seen.  Stage II sleep is not seen.  Hyperventilation and photic stimulation were performed, and produced no abnormalities.  ECG revealed normal cardiac rate and rhythm.  Impression: This is a normal routine EEG of  the awake and drowsy states, with activating procedures.  A normal study does not rule out the possibility of a seizure disorder in this patient.  [name], DO

## 2016-05-27 NOTE — Progress Notes (Signed)
05/27/16: No noted clinically significant drug interaction with AEDs.  Carney Saxton S. Alford Highland, PharmD, [place] Clinical Staff Pharmacist Pager [phone]

## 2016-05-27 NOTE — Progress Notes (Signed)
EEG completed; results pending.    

## 2016-05-27 NOTE — Care Management Obs Status (Signed)
[place] NOTIFICATION   Patient Details  Name: Tricia Huynh MRN: [id number] Date of Birth: December 28, 1955   Medicare Observation Status Notification Given:  Yes    Pollie Friar, RN 05/27/2016, 2:42 PM

## 2016-05-28 DIAGNOSIS — E11649 Type 2 diabetes mellitus with hypoglycemia without coma: Secondary | ICD-10-CM | POA: Diagnosis not present

## 2016-05-28 DIAGNOSIS — R569 Unspecified convulsions: Secondary | ICD-10-CM | POA: Diagnosis not present

## 2016-05-28 DIAGNOSIS — G9340 Encephalopathy, unspecified: Secondary | ICD-10-CM | POA: Diagnosis not present

## 2016-05-28 DIAGNOSIS — I10 Essential (primary) hypertension: Secondary | ICD-10-CM | POA: Diagnosis not present

## 2016-05-28 LAB — BASIC METABOLIC PANEL
Anion gap: 6 (ref 5–15)
BUN: 14 mg/dL (ref 6–20)
CO2: 30 mmol/L (ref 22–32)
Calcium: 9 mg/dL (ref 8.9–10.3)
Chloride: 104 mmol/L (ref 101–111)
Creatinine, Ser: 1.18 mg/dL — ABNORMAL HIGH (ref 0.44–1.00)
GFR calc Af Amer: 57 mL/min — ABNORMAL LOW (ref >60)
GFR calc non Af Amer: 49 mL/min — ABNORMAL LOW (ref >60)
Glucose, Bld: 93 mg/dL (ref 65–99)
Potassium: 3.7 mmol/L (ref 3.5–5.1)
Sodium: 140 mmol/L (ref 135–145)

## 2016-05-28 LAB — GLUCOSE, CAPILLARY
Glucose-Capillary: 94 mg/dL (ref 65–99)
Glucose-Capillary: 112 mg/dL — ABNORMAL HIGH (ref 65–99)
Glucose-Capillary: 127 mg/dL — ABNORMAL HIGH (ref 65–99)

## 2016-05-28 MED ORDER — INSULIN GLARGINE 100 UNIT/ML SOLOSTAR PEN: 15.0000 [IU] | PEN_INJECTOR | SUBCUTANEOUS | Status: DC

## 2016-05-28 MED ORDER — LEVETIRACETAM 750 MG PO TABS: 750.0000 mg | ORAL_TABLET | Freq: Two times a day (BID) | ORAL | Status: DC

## 2016-05-28 MED ORDER — GLUCERNA SHAKE PO LIQD
237.0000 mL | Freq: Two times a day (BID) | ORAL | Status: DC | PRN
  Filled 2016-05-28: qty 237

## 2016-05-28 NOTE — Discharge Summary (Signed)
Physician Discharge Summary  Tricia Huynh [id number] DOB: Aug 08, 1956  PCP: Lilian Coma, MD  Admit date: 05/26/2016 Discharge date: 05/28/2016  Admitted From: Home Disposition:  Home  Recommendations for Outpatient Follow-up:  1. Dr. Jonathon Jordan, PCP in 3 days. 2. Dr. Renato Shin, Endocrinology: Keep prior appointment for 06/04/16 at 1 PM.  Home Health: RN Equipment/Devices: None    Discharge Condition: Improved and stable.  CODE STATUS: Full  Diet recommendation: Heart healthy and diabetic diet.  Discharge Diagnoses:  Principal Problem:   Acute encephalopathy Active Problems:   Diabetic neuropathy (HCC)   Memory loss   Seizures (HCC)   CKD (chronic kidney disease) stage 3, GFR 30-59 ml/min   Uncontrolled diabetes mellitus with diabetic nephropathy, with long-term current use of insulin (HCC)   Benign essential HTN   Dyslipidemia associated with type 2 diabetes mellitus (HCC)   Altered mental status   Brief/Interim Summary: 60 year old female with a history of hypertension, seizure disorder, CKD stage III, diabetes mellitus with diabetic neuropathy presented from home with onset of acute mental status/confusion on the morning of 05/26/2016. Around 80 on 05/26/2016, the patient was noted to be laying in bed moaning and grunting. Around 10 AM, her grandson came to wake her up, and the patient was noted to be confused and moaning with inability to recognize her family. Apparently, the patient was nonverbal in the emergency department and history was obtained from the patient's family. According to the family, the patient had been in her usual state of health. EMS was activated and her CBG was noted to be 31. The patient was given D50. However in the emergency department, the patient remained encephalopathic and nonverbal. There was no reports of tonic-clonic activity or loss of consciousness. Since admission to the hospital and arrival in the medical floor, the  patient's mental status has returned to baseline. Neurology was consulted and felt that the patient may have had a seizure secondary to her hypoglycemia. Patient states that there has been no changes in her insulin regimen for the past 3 weeks.  Assessment/Plan: Acute encephalopathy -Secondary to hypoglycemia and subsequent seizure -Appreciate neurology consultation: As per their opinion, patient presented with altered mental status and was found to have hypoglycemia probably had a seizure also. -Patient loaded on Keppra -Keppra was adjusted to 750 MG twice a day. -05/26/2016 MRI brain negative for hemorrhage or infarct. Unchanged left occipital subcutaneous mass with bone remodeling -EEG: Impression: This is a normal routine EEG of the awake and drowsy states, with activating procedures. A normal study does not rule out the possibility of a seizure disorder in this patient. -Urinalysis is negative for pyuria -Urine drug screen negative - EKG--sinus, non specific ST changes - As per discussion with patient's daughter at bedside today, mental status back to baseline.  Seizure/Hypoglycemia -precipitated by hypoglycemia -pt only eating one meal daily -hx of poor compliance per Dr. Cordelia Pen last note although pt presently states she is faithful to her insulin -Nutrition was consulted and made recommendations and advised patient regarding same. - Patient has been counseled regarding driving restrictions. - Patient is on amitriptyline, has been on it for quite a long time. Follow-up with PCP to consider tapering to discontinue because it can reduce the seizure threshold.  Diabetes mellitus type 2 with neuropathy, with recurrent hypoglycemia -Hemoglobin A1c--8.7 - Patient states that she has been on the same dose of Lantus 70 units daily for a long time and is compliant with same but not with regular diet intake.  She gives history of frequent episodes of hypoglycemia where CBGs at home have gone  up to 65 mg per DL. Due to initial presentation with profound hypoglycemia, patient was placed on reduced dose of Lantus 15 units daily and her CBGs are reasonably controlled as below between 94-135 milligrams per DL. She will be discharged on current reduced dose of Lantus. She has been advised to monitor her home CBGs closely and can follow-up with PCP in the next couple of days for further adjustment. She also has a follow-up appointment with endocrinology in a week's time which she is advised to keep.  Cognitive impairment -will need outpatient neurology follow-up  Hypertension -Continue amlodipine, irbesartan, metoprolol succinate, HCTZ. Reasonable control.    CKD stage II-III  -Baseline creatinine 0.8-1.1  Hyperlipidemia  -Continue atorvastatin   Chronic anemia - Stable  Asymptomatic bacteriuria   Discharge Instructions      Discharge Instructions    Call MD for:  extreme fatigue    Complete by:  As directed      Call MD for:  persistant dizziness or light-headedness    Complete by:  As directed      Call MD for:    Complete by:  As directed   Passing out or seizure-like activity.     Diet - low sodium heart healthy    Complete by:  As directed      Diet Carb Modified    Complete by:  As directed      Driving Restrictions    Complete by:  As directed   No driving until cleared by your physician during outpatient follow-up.     Increase activity slowly    Complete by:  As directed             Medication List    TAKE these medications        ACCU-CHEK AVIVA device  Use as instructed     amitriptyline 50 MG tablet  Commonly known as:  ELAVIL  Take 50 mg by mouth at bedtime.     atorvastatin 40 MG tablet  Commonly known as:  LIPITOR  Take 40 mg by mouth daily.     AZOR 5-40 MG tablet  Generic drug:  amLODipine-olmesartan  Take 1 tablet by mouth daily. Reported on 01/02/2016     gabapentin 600 MG tablet  Commonly known as:  NEURONTIN  Take 600 mg by  mouth 2 (two) times daily.     glucose blood test strip  Commonly known as:  ACCU-CHEK AVIVA  1 each by Other route 2 (two) times daily. And lancets 2/day     hydrochlorothiazide 25 MG tablet  Commonly known as:  HYDRODIURIL  Take 1 tablet (25 mg total) by mouth daily.     Insulin Glargine 100 UNIT/ML Solostar Pen  Commonly known as:  LANTUS SOLOSTAR  Inject 15 Units into the skin every morning.  Start taking on:  05/29/2016     levETIRAcetam 750 MG tablet  Commonly known as:  KEPPRA  Take 1 tablet (750 mg total) by mouth 2 (two) times daily.     metoprolol succinate 25 MG 24 hr tablet  Commonly known as:  TOPROL-XL  Take 50 mg by mouth at bedtime.     sucralfate 1 g tablet  Commonly known as:  CARAFATE  Take 1 tablet (1 g total) by mouth 4 (four) times daily -  with meals and at bedtime.       Follow-up Information  Follow up with Lilian Coma, MD. Schedule an appointment as soon as possible for a visit in 3 days.   Specialty:  Family Medicine   Why:  Follow-up regarding adjustment of diabetic medications.   Contact information:   [address] [place] [address] [phone]       Follow up with Renato Shin, MD On 06/04/2016.   Specialty:  Endocrinology   Why:  1 pm. Keep this prior appointment.   Contact information:   [address] [place] [address] [phone]      Allergies  Allergen Reactions  . Percocet [Oxycodone-Acetaminophen] Nausea And Vomiting and Other (See Comments)    Shaky/unsteady.  . Vicodin [Hydrocodone-Acetaminophen] Nausea And Vomiting and Other (See Comments)    Shaky/unsteady.    Consultations:  Neurology   Procedures/Studies: Dg Chest 2 View  05/26/2016  CLINICAL DATA:  Altered level of consciousness for 1 day. Ex-smoker. History of hypertension. EXAM: CHEST  2 VIEW COMPARISON:  Chest x-rays dated 12/25/2015 and 09/18/2015. FINDINGS: Mediastinum is widened, a significant change  compared to previous exams. Heart appears enlarged, also a change from previous exam. Lungs are clear. No evidence of pneumonia. No pleural effusion or pneumothorax seen. Osseous structures about the chest are unremarkable. IMPRESSION: Mediastinum is widened and heart appears enlarged, both representing a significant change compared to previous exams. Mediastinal mass, lymphadenopathy, aneurysm and pericardial effusion are all possibilities and chest CT is recommended for further characterization. These results were called by telephone at the time of interpretation on 05/26/2016 at 1:26 pm to Dr. Montine Circle , who verbally acknowledged these results. Electronically Signed   By: Franki Cabot M.D.   On: 05/26/2016 13:26   Ct Head Wo Contrast  05/26/2016  CLINICAL DATA:  Patient non-verbal nods her head to having a headache. Low blood sugar Question if any trauma EXAM: CT HEAD WITHOUT CONTRAST TECHNIQUE: Contiguous axial images were obtained from the base of the skull through the vertex without intravenous contrast. COMPARISON:  11/11/2015 FINDINGS: The ventricles are normal in configuration. There is mild ventricular and sulcal enlargement reflecting mild atrophy, stable. There are no parenchymal masses or mass effect. There is no evidence of an infarct. There are no extra-axial masses or abnormal fluid collections. There is no intracranial hemorrhage. Homogeneous circumscribed low-attenuation subgaleal lesion with associated remodeling of the parietal bone on the left is stable. Moderate ethmoid sinus mucosal thickening.  Clear mastoid air cells. IMPRESSION: 1. No acute intracranial abnormalities. No change from the prior study. Electronically Signed   By: Lajean Manes M.D.   On: 05/26/2016 13:27   Mr Brain Wo Contrast  05/26/2016  CLINICAL DATA:  60 year old hypertensive diabetic female with history seizures presenting with altered mental status. Confused. Blood sugar 31. Subsequent encounter. EXAM: MRI  HEAD WITHOUT CONTRAST TECHNIQUE: Multiplanar, multiecho pulse sequences of the brain and surrounding structures were obtained without intravenous contrast. COMPARISON:  05/26/2016 head CT.  06/06/2015 brain MR. FINDINGS: No acute infarct or intracranial hemorrhage. Minimal nonspecific white matter changes consistent with chronic microvascular changes. Mild global atrophy without hydrocephalus. Nonspecific 2.6 x 3.7 x 1.1 cm left occipital region subgaleal/subcutaneous mass with bony remodeling/thinning similar to prior exam of indeterminate etiology. Major intracranial vascular structures are patent. No evidence of mesial temporal sclerosis. Opacification ethmoid sinus air cells bilaterally. Minimal mucosal thickening right frontal sinus. Minimal mucosal thickening maxillary sinuses more notable on the right. Post lens replacement without acute orbital abnormality. Top-normal size pituitary gland. Cervical medullary  junction unremarkable. IMPRESSION: No acute infarct or intracranial hemorrhage. Minimal nonspecific white matter changes consistent with chronic microvascular changes. Mild global atrophy without hydrocephalus. Nonspecific 2.6 x 3.7 x 1.1 cm left occipital region subgaleal/subcutaneous mass with bony remodeling/thinning similar to prior exam of indeterminate etiology. Opacification ethmoid sinus air cells bilaterally. Minimal mucosal thickening right frontal sinus. Minimal mucosal thickening maxillary sinuses more notable on the right. Electronically Signed   By: Genia Del M.D.   On: 05/26/2016 16:49   Ct Angio Chest/abd/pel For Dissection W And/or W/wo  05/26/2016  CLINICAL DATA:  Widened mediastinum on chest radiographs earlier today. No known injury. Altered mental status for 1 day. Ex-smoker. History of hypertension. EXAM: CT ANGIOGRAPHY CHEST, ABDOMEN AND PELVIS TECHNIQUE: Multidetector CT imaging through the chest, abdomen and pelvis was performed using the standard protocol during bolus  administration of intravenous contrast. Multiplanar reconstructed images and MIPs were obtained and reviewed to evaluate the vascular anatomy. CONTRAST:  100 cc Isovue 370 COMPARISON:  Previous chest radiographs, including earlier today. FINDINGS: CTA CHEST FINDINGS Mediastinum: Normal appearing thoracic aorta without aneurysm or dissection. No enlarged lymph nodes. Normal sized heart. There is prominent mediastinal and epicardial fat, simulating cardiomegaly and causing the widened appearance of the mediastinum on the recent chest radiographs. No mediastinal mass or adenopathy. Normally opacified pulmonary arteries with no pulmonary arterial filling defects. Lungs and pleura: Mild bilateral dependent atelectasis. No lung nodules or pleural fluid. Musculoskeletal:  Normal appearing bones. Review of the MIP images confirms the above findings. CTA ABDOMEN AND PELVIS FINDINGS Hepatobiliary: Normal appearing liver and gallbladder. Pancreas: No mass, inflammatory changes, or other significant abnormality. Spleen: Within normal limits in size and appearance. Adrenals/Urinary Tract: Normal appearing adrenal glands, kidneys, ureters and urinary bladder. No urinary tract calculi or hydronephrosis. Stomach/Bowel: No gastrointestinal abnormalities. No evidence of appendicitis. Vascular/Lymphatic: Normally opacified arteries and veins with no significant atheromatous changes seen. No aneurysm or dissection. No enlarged lymph nodes. Reproductive: Surgically absent uterus.  Normal appearing ovaries. Other: None. Musculoskeletal:  Unremarkable bones. Review of the MIP images confirms the above findings. IMPRESSION: 1. No aortic dissection or aneurysm. 2. No mediastinal mass or adenopathy. The recently demonstrate widening of the mediastinum was due to mediastinal fat. 3. Normal sized heart. The recently suggested cardiomegaly was due to prominent epicardial fat. 4. No acute abnormality in the chest, abdomen or pelvis.  Electronically Signed   By: Claudie Revering M.D.   On: 05/26/2016 14:36      Subjective: Denies complaints. As per daughter at bedside, mental status back to baseline.  Discharge Exam:  Filed Vitals:   05/28/16 0100 05/28/16 0443 05/28/16 0923 05/28/16 1300  BP: 125/79 118/73 139/86 138/87  Pulse: 79 83 89 84  Temp: 98.7 F (37.1 C) 98.5 F (36.9 C) 97.9 F (36.6 C) 98.3 F (36.8 C)  TempSrc: Oral Oral Oral Oral  Resp: 18 18 20 14   Height:      Weight:      SpO2: 99% 99% 100% 100%    General: Pt sitting up comfortably in bed & appears in no obvious distress. Cardiovascular: S1 & S2 heard, RRR, S1/S2 +. No murmurs, rubs, gallops or clicks. No JVD or pedal edema. Telemetry: Sinus rhythm. Respiratory: Clear to auscultation without wheezing, rhonchi or crackles. No increased work of breathing. Abdominal:  Non distended, non tender & soft. No organomegaly or masses appreciated. Normal bowel sounds heard. CNS: Alert and oriented. No focal deficits. Extremities: no edema, no cyanosis    The results  of significant diagnostics from this hospitalization (including imaging, microbiology, ancillary and laboratory) are listed below for reference.     Microbiology: No results found for this or any previous visit (from the past 240 hour(s)).   Labs: BNP (last 3 results) No results for input(s): BNP in the last 8760 hours. Basic Metabolic Panel:  Recent Labs Lab 05/26/16 1315 05/26/16 1338 05/26/16 1802 05/28/16 0544  NA 139 140  --  140  K 3.7 3.8  --  3.7  CL 108 105  --  104  CO2 25  --   --  30  GLUCOSE 183* 181*  --  93  BUN 12 13  --  14  CREATININE 1.09* 1.00  --  1.18*  CALCIUM 9.1  --   --  9.0  MG  --   --  2.3  --    Liver Function Tests:  Recent Labs Lab 05/26/16 1315  AST 31  ALT 36  ALKPHOS 165*  BILITOT 0.5  PROT 7.6  ALBUMIN 3.5   No results for input(s): LIPASE, AMYLASE in the last 168 hours. No results for input(s): AMMONIA in the last 168  hours. CBC:  Recent Labs Lab 05/26/16 1315 05/26/16 1338 05/26/16 1802  WBC 6.7  --  6.6  NEUTROABS 5.3  --   --   HGB 9.8* 9.9* 10.2*  HCT 30.4* 29.0* 31.2*  MCV 88.6  --  87.9  PLT 264  --  255   Cardiac Enzymes: No results for input(s): CKTOTAL, CKMB, CKMBINDEX, TROPONINI in the last 168 hours. BNP: Invalid input(s): POCBNP CBG:  Recent Labs Lab 05/27/16 1147 05/27/16 1615 05/27/16 2200 05/28/16 0610 05/28/16 1118  GLUCAP 106* 119* 135* 94 112*   D-Dimer No results for input(s): DDIMER in the last 72 hours. Hgb A1c  Recent Labs  05/26/16 1802  HGBA1C 8.7*   Lipid Profile No results for input(s): CHOL, HDL, LDLCALC, TRIG, CHOLHDL, LDLDIRECT in the last 72 hours. Thyroid function studies No results for input(s): TSH, T4TOTAL, T3FREE, THYROIDAB in the last 72 hours.  Invalid input(s): FREET3 Anemia work up No results for input(s): VITAMINB12, FOLATE, FERRITIN, TIBC, IRON, RETICCTPCT in the last 72 hours. Urinalysis    Component Value Date/Time   COLORURINE YELLOW 05/26/2016 1451   APPEARANCEUR CLEAR 05/26/2016 1451   LABSPEC 1.025 05/26/2016 1451   PHURINE 5.0 05/26/2016 1451   GLUCOSEU 100* 05/26/2016 1451   HGBUR NEGATIVE 05/26/2016 1451   BILIRUBINUR NEGATIVE 05/26/2016 1451   KETONESUR NEGATIVE 05/26/2016 1451   PROTEINUR NEGATIVE 05/26/2016 1451   UROBILINOGEN 0.2 09/18/2015 1327   NITRITE POSITIVE* 05/26/2016 1451   LEUKOCYTESUR NEGATIVE 05/26/2016 1451   Sepsis Labs Invalid input(s): PROCALCITONIN,  WBC,  LACTICIDVEN    Time coordinating discharge: Over 30 minutes  SIGNED:  Vernell Leep, MD, FACP, FHM. [facility] Pager [phone]  If 7PM-7AM, please contact night-coverage [email or web address] Password [facility] 05/28/2016, 4:57 PM

## 2016-05-28 NOTE — Progress Notes (Signed)
Discharge instructions reviewed with patient/family. All questions answered at this time. Transport home by family.   Shawnee Gambone, RN 

## 2016-05-28 NOTE — Discharge Instructions (Signed)
Hypoglycemia Hypoglycemia occurs when the glucose in your blood is too low. Glucose is a type of sugar that is your body's main energy source. Hormones, such as insulin and glucagon, control the level of glucose in the blood. Insulin lowers blood glucose and glucagon increases blood glucose. Having too much insulin in your blood stream, or not eating enough food containing sugar, can result in hypoglycemia. Hypoglycemia can happen to people with or without diabetes. It can develop quickly and can be a medical emergency.  CAUSES   Missing or delaying meals.  Not eating enough carbohydrates at meals.  Taking too much diabetes medicine.  Not timing your oral diabetes medicine or insulin doses with meals, snacks, and exercise.  Nausea and vomiting.  Certain medicines.  Severe illnesses, such as hepatitis, kidney disorders, and certain eating disorders.  Increased activity or exercise without eating something extra or adjusting medicines.  Drinking too much alcohol.  A nerve disorder that affects body functions like your heart rate, blood pressure, and digestion (autonomic neuropathy).  A condition where the stomach muscles do not function properly (gastroparesis). Therefore, medicines and food may not absorb properly.  Rarely, a tumor of the pancreas can produce too much insulin. SYMPTOMS   Hunger.  Sweating (diaphoresis).  Change in body temperature.  Shakiness.  Headache.  Anxiety.  Lightheadedness.  Irritability.  Difficulty concentrating.  Dry mouth.  Tingling or numbness in the hands or feet.  Restless sleep or sleep disturbances.  Altered speech and coordination.  Change in mental status.  Seizures or prolonged convulsions.  Combativeness.  Drowsiness (lethargic).  Weakness.  Increased heart rate or palpitations.  Confusion.  Pale, gray skin color.  Blurred or double vision.  Fainting. DIAGNOSIS  A physical exam and medical history will be  performed. Your caregiver may make a diagnosis based on your symptoms. Blood tests and other lab tests may be performed to confirm a diagnosis. Once the diagnosis is made, your caregiver will see if your signs and symptoms go away once your blood glucose is raised.  TREATMENT  Usually, you can easily treat your hypoglycemia when you notice symptoms.  Check your blood glucose. If it is less than 70 mg/dl, take one of the following:   3-4 glucose tablets.    cup juice.    cup regular soda.   1 cup skim milk.   -1 tube of glucose gel.   5-6 hard candies.   Avoid high-fat drinks or food that may delay a rise in blood glucose levels.  Do not take more than the recommended amount of sugary foods, drinks, gel, or tablets. Doing so will cause your blood glucose to go too high.   Wait 10-15 minutes and recheck your blood glucose. If it is still less than 70 mg/dl or below your target range, repeat treatment.   Eat a snack if it is more than 1 hour until your next meal.  There may be a time when your blood glucose may go so low that you are unable to treat yourself at home when you start to notice symptoms. You may need someone to help you. You may even faint or be unable to swallow. If you cannot treat yourself, someone will need to bring you to the hospital.  [place]  If you have diabetes, follow your diabetes management plan by:  Taking your medicines as directed.  Following your exercise plan.  Following your meal plan. Do not skip meals. Eat on time.  Testing your blood  glucose regularly. Check your blood glucose before and after exercise. If you exercise longer or different than usual, be sure to check blood glucose more frequently.  Wearing your medical alert jewelry that says you have diabetes.  Identify the cause of your hypoglycemia. Then, develop ways to prevent the recurrence of hypoglycemia.  Do not take a hot bath or shower right after an  insulin shot.  Always carry treatment with you. Glucose tablets are the easiest to carry.  If you are going to drink alcohol, drink it only with meals.  Tell friends or family members ways to keep you safe during a seizure. This may include removing hard or sharp objects from the area or turning you on your side.  Maintain a healthy weight. SEEK MEDICAL CARE IF:   You are having problems keeping your blood glucose in your target range.  You are having frequent episodes of hypoglycemia.  You feel you might be having side effects from your medicines.  You are not sure why your blood glucose is dropping so low.  You notice a change in vision or a new problem with your vision. SEEK IMMEDIATE MEDICAL CARE IF:   Confusion develops.  A change in mental status occurs.  The inability to swallow develops.  Fainting occurs.   This information is not intended to replace advice given to you by your health care provider. Make sure you discuss any questions you have with your health care provider.   Document Released: 11/03/2005 Document Revised: 11/08/2013 Document Reviewed: 07/10/2015 Elsevier Interactive Patient Education [employer].  Epilepsy Epilepsy is a disorder in which a person has repeated seizures over time. A seizure is a release of abnormal electrical activity in the brain. Seizures can cause a change in attention, behavior, or the ability to remain awake and alert (altered mental status). Seizures often involve uncontrollable shaking (convulsions).  Most people with epilepsy lead normal lives. However, people with epilepsy are at an increased risk of falls, accidents, and injuries. Therefore, it is important to begin treatment right away. CAUSES  Epilepsy has many possible causes. Anything that disturbs the normal pattern of brain cell activity can lead to seizures. This may include:   Head injury.  Birth trauma.  High fever as a child.  Stroke.  Bleeding into  or around the brain.  Certain drugs.  Prolonged low oxygen, such as what occurs after CPR efforts.  Abnormal brain development.  Certain illnesses, such as meningitis, encephalitis (brain infection), malaria, and other infections.  An imbalance of nerve signaling chemicals (neurotransmitters).  SIGNS AND SYMPTOMS  The symptoms of a seizure can vary greatly from one person to another. Right before a seizure, you may have a warning (aura) that a seizure is about to occur. An aura may include the following symptoms:  Fear or anxiety.  Nausea.  Feeling like the room is spinning (vertigo).  Vision changes, such as seeing flashing lights or spots. Common symptoms during a seizure include:  Abnormal sensations, such as an abnormal smell or a bitter taste in the mouth.   Sudden, general body stiffness.   Convulsions that involve rhythmic jerking of the face, arm, or leg on one or both sides.   Sudden change in consciousness.   Appearing to be awake but not responding.   Appearing to be asleep but cannot be awakened.   Grimacing, chewing, lip smacking, drooling, tongue biting, or loss of bowel or bladder control. After a seizure, you may feel sleepy for a while.  DIAGNOSIS  Your health care provider will ask about your symptoms and take a medical history. Descriptions from any witnesses to your seizures will be very helpful in the diagnosis. A physical exam, including a detailed neurological exam, is necessary. Various tests may be done, such as:   An electroencephalogram (EEG). This is a painless test of your brain waves. In this test, a diagram is created of your brain waves. These diagrams can be interpreted by a specialist.  An MRI of the brain.   A CT scan of the brain.   A spinal tap (lumbar puncture, LP).  Blood tests to check for signs of infection or abnormal blood chemistry. TREATMENT  There is no cure for epilepsy, but it is generally treatable. Once  epilepsy is diagnosed, it is important to begin treatment as soon as possible. For most people with epilepsy, seizures can be controlled with medicines. The following may also be used:  A pacemaker for the brain (vagus nerve stimulator) can be used for people with seizures that are not well controlled by medicine.  Surgery on the brain. For some people, epilepsy eventually goes away. HOME CARE INSTRUCTIONS   Follow your health care provider's recommendations on driving and safety in normal activities.  Get enough rest. Lack of sleep can cause seizures.  Only take over-the-counter or prescription medicines as directed by your health care provider. Take any prescribed medicine exactly as directed.  Avoid any known triggers of your seizures.  Keep a seizure diary. Record what you recall about any seizure, especially any possible trigger.   Make sure the people you live and work with know that you are prone to seizures. They should receive instructions on how to help you. In general, a witness to a seizure should:   Cushion your head and body.   Turn you on your side.   Avoid unnecessarily restraining you.   Not place anything inside your mouth.   Call for emergency medical help if there is any question about what has occurred.   Follow up with your health care provider as directed. You may need regular blood tests to monitor the levels of your medicine.  SEEK MEDICAL CARE IF:   You develop signs of infection or other illness. This might increase the risk of a seizure.   You seem to be having more frequent seizures.   Your seizure pattern is changing.  SEEK IMMEDIATE MEDICAL CARE IF:   You have a seizure that does not stop after a few moments.   You have a seizure that causes any difficulty in breathing.   You have a seizure that results in a very severe headache.   You have a seizure that leaves you with the inability to speak or use a part of your body.      This information is not intended to replace advice given to you by your health care provider. Make sure you discuss any questions you have with your health care provider.   Document Released: 11/03/2005 Document Revised: 08/24/2013 Document Reviewed: 06/15/2013 Elsevier Interactive Patient Education [employer].

## 2016-05-28 NOTE — Progress Notes (Signed)
  RD consulted for nutrition education regarding diabetes.   Lab Results  Component Value Date   HGBA1C 8.7* 05/26/2016    Pt reports that she take insulin once daily in the morning. She usually eats one large meal daily around noon and sometimes snacks on cookies. SHe reports being diagnosed with diabetes in 2005. She reports that she has low blood sugars fairly often which she corrects by eating cookies or hard candies. She reports lack of knowledge on diet and how it relates to her blood sugar. RD provided "Type 2 Diabetes Nutrition Therapy" handout from the Academy of Nutrition and Dietetics. Discussed different food groups and their effects on blood sugar, emphasizing carbohydrate-containing foods. Provided list of carbohydrates and recommended serving sizes of common foods.  Discussed importance of controlled and consistent carbohydrate intake throughout the day. Encouraged pt to eat 3 meals and one evening snack daily. Pt states that she often doesn't have an appetite. RD recommended eating a snack or drinking a Glucerna Shake if she does not feel like eating a meal. Provided examples of ways to balance meals/snacks and encouraged intake of high-fiber, whole grain complex carbohydrates as well as vegetables. Teach back method used.  Expect good compliance.   Body mass index is 23.86 kg/(m^2). Pt meets criteria for Normal Weight based on current BMI. Pt states that she has gained weight recently. Per weight history, she has gained 22 lbs in the past 5 months.   Current diet order is Heart Healthy/Carb Modified, patient is consuming approximately 50-100% of meals at this time. Labs and medications reviewed. Will order Glucerna Shakes PRN for pt to try prior to discharge. No further nutrition interventions warranted at this time. RD contact information provided. If additional nutrition issues arise, please re-consult RD.  Scarlette Ar RD, LDN Inpatient Clinical Dietitian Pager:  [phone] After Hours Pager: [phone]

## 2016-05-29 NOTE — Care Management (Signed)
05/28/2016: patient discharged late yesterday with orders for [facility] services. CM called Ms Spittler this am to see which agency she would like to use for her [facility] services. Ms Reffner stated she already had a RN coming out from [place] and she would like to continue services with them. Mary with [facility]) notified and will continue RN services.

## 2016-05-31 DIAGNOSIS — E1122 Type 2 diabetes mellitus with diabetic chronic kidney disease: Secondary | ICD-10-CM | POA: Diagnosis not present

## 2016-05-31 DIAGNOSIS — G40909 Epilepsy, unspecified, not intractable, without status epilepticus: Secondary | ICD-10-CM | POA: Diagnosis not present

## 2016-05-31 DIAGNOSIS — N183 Chronic kidney disease, stage 3 (moderate): Secondary | ICD-10-CM | POA: Diagnosis not present

## 2016-05-31 DIAGNOSIS — E11649 Type 2 diabetes mellitus with hypoglycemia without coma: Secondary | ICD-10-CM | POA: Diagnosis not present

## 2016-06-04 ENCOUNTER — Encounter: Payer: Self-pay | Admitting: Endocrinology

## 2016-06-04 ENCOUNTER — Ambulatory Visit (INDEPENDENT_AMBULATORY_CARE_PROVIDER_SITE_OTHER): Payer: Medicare Other | Admitting: Endocrinology

## 2016-06-04 VITALS — BP 128/80 | HR 95 | Wt 158.0 lb

## 2016-06-04 DIAGNOSIS — E1142 Type 2 diabetes mellitus with diabetic polyneuropathy: Secondary | ICD-10-CM

## 2016-06-04 DIAGNOSIS — Z794 Long term (current) use of insulin: Secondary | ICD-10-CM

## 2016-06-04 NOTE — Patient Instructions (Addendum)
check your blood sugar twice a day.  vary the time of day when you check, between before the 3 meals, and at bedtime.  also check if you have symptoms of your blood sugar being too high or too low.  please keep a record of the readings and bring it to your next appointment here (or you can bring the meter itself).  You can write it on any piece of paper.  please call us sooner if your blood sugar goes below 70, or if you have a lot of readings over 200.   Continue lantus, 15 units each morning.   With time, your insulin need will probably increase, so call if your blood sugar stays over 200.   On this type of insulin schedule, you should eat meals on a regular schedule.  If a meal is missed or significantly delayed, your blood sugar could go low.  Please come back for a follow-up appointment in 6 weeks.

## 2016-06-04 NOTE — Progress Notes (Signed)
Subjective:    Patient ID: Tricia Huynh, female    DOB: [birth year], 60 y.o.   MRN: [id number]  HPI Pt returns for f/u of diabetes mellitus: DM type: Insulin-requiring type 2 Dx'ed: [id number] Complications: polyneuropathy Therapy: insulin since 2010.   GDM: never DKA: never Severe hypoglycemia: once, in 2017.  Pancreatitis: never Other: She takes QD insulin, due to noncompliance; insurance declined V-GO.   Interval history: She was recently in the hospital for severe hypoglycemia, causing AMS.  This happened in the fasting state.  She is unable to cite precip factor.  lantus was reduced to 15 units qd.  She says she never misses it.  no cbg record, but states cbg's are approx 100.  No further hypoglycemia.   Past Medical History  Diagnosis Date  . Diabetes mellitus without complication ([place])   . Hypertension   . Mood disorder ([place]) 06/06/2015  . Essential hypertension 06/06/2015  . Dyslipidemia 06/06/2015  . Diabetic neuropathy ([place]) 06/06/2015  . Diabetes mellitus type 2 in nonobese ([place]) 06/06/2015  . Encephalopathy   . Seizure [facility])     sees [name]. abd eeg, on keppra    Past Surgical History  Procedure Laterality Date  . Abdominal hysterectomy    . Tubal ligation    . Eye surgery Bilateral     Social History   Social History  . Marital Status: Single    Spouse Name: N/A  . Number of Children: 3  . Years of Education: 10   Occupational History  . Disabled    Social History Main Topics  . Smoking status: Former Research scientist (life sciences)  . Smokeless tobacco: Never Used  . Alcohol Use: No  . Drug Use: No  . Sexual Activity: Yes   Other Topics Concern  . Not on file   Social History Narrative   Lives at home with her grandchildren.   Right-handed.   No caffeine use.    Current Outpatient Prescriptions on File Prior to Visit  Medication Sig Dispense Refill  . amitriptyline (ELAVIL) 50 MG tablet Take 50 mg by mouth at bedtime.  1  . amLODipine-olmesartan (AZOR) 5-40 MG per  tablet Take 1 tablet by mouth daily. Reported on 01/02/2016    . atorvastatin (LIPITOR) 40 MG tablet Take 40 mg by mouth daily.  1  . Blood Glucose Monitoring Suppl (ACCU-CHEK AVIVA) device Use as instructed 1 each 0  . gabapentin (NEURONTIN) 600 MG tablet Take 600 mg by mouth 2 (two) times daily.   0  . glucose blood (ACCU-CHEK AVIVA) test strip 1 each by Other route 2 (two) times daily. And lancets 2/day 100 each 12  . hydrochlorothiazide (HYDRODIURIL) 25 MG tablet Take 1 tablet (25 mg total) by mouth daily. 30 tablet 0  . Insulin Glargine (LANTUS SOLOSTAR) 100 UNIT/ML Solostar Pen Inject 15 Units into the skin every morning.    . levETIRAcetam (KEPPRA) 750 MG tablet Take 1 tablet (750 mg total) by mouth 2 (two) times daily. 60 tablet 0  . metoprolol succinate (TOPROL-XL) 25 MG 24 hr tablet Take 50 mg by mouth at bedtime.  1  . sucralfate (CARAFATE) 1 G tablet Take 1 tablet (1 g total) by mouth 4 (four) times daily -  with meals and at bedtime. 30 tablet 0   No current facility-administered medications on file prior to visit.    Allergies  Allergen Reactions  . Percocet [Oxycodone-Acetaminophen] Nausea And Vomiting and Other (See Comments)    Shaky/unsteady.  . Vicodin [Hydrocodone-Acetaminophen] Nausea  And Vomiting and Other (See Comments)    Shaky/unsteady.    Family History  Problem Relation Age of Onset  . Heart disease Father   . Kidney disease Father   . Diabetes Mother   . Seizures Sister   . Seizures Brother     BP 128/80 mmHg  Pulse 95  Wt 158 lb (71.668 kg)  SpO2 99%  Review of Systems She has gained weight.     Objective:   Physical Exam VITAL SIGNS:  See vs page. GENERAL: no distress. Pulses: dorsalis pedis intact bilat.   MSK: no deformity of the feet. CV: no leg edema.  Skin:  no ulcer on the feet.  normal color and temp on the feet. Neuro: sensation is intact to touch on the feet.   A1c=8.7%    Assessment & Plan:  Insulin-requiring type 2 DM: she  needs to resume insulin.   Severe hypoglycemia: in this setting, she is not a candidate for aggressive glycemic control.   Noncompliance with cbg recording and f/u ov's: we discussed risks.    Patient is advised the following: Patient Instructions  check your blood sugar twice a day.  vary the time of day when you check, between before the 3 meals, and at bedtime.  also check if you have symptoms of your blood sugar being too high or too low.  please keep a record of the readings and bring it to your next appointment here (or you can bring the meter itself).  You can write it on any piece of paper.  please call us sooner if your blood sugar goes below 70, or if you have a lot of readings over 200.   Continue lantus, 15 units each morning.   With time, your insulin need will probably increase, so call if your blood sugar stays over 200.   On this type of insulin schedule, you should eat meals on a regular schedule.  If a meal is missed or significantly delayed, your blood sugar could go low.  Please come back for a follow-up appointment in 6 weeks.    Renato Shin, MD

## 2016-06-05 DIAGNOSIS — D649 Anemia, unspecified: Secondary | ICD-10-CM | POA: Diagnosis not present

## 2016-06-05 DIAGNOSIS — Z79899 Other long term (current) drug therapy: Secondary | ICD-10-CM | POA: Diagnosis not present

## 2016-06-05 DIAGNOSIS — E1140 Type 2 diabetes mellitus with diabetic neuropathy, unspecified: Secondary | ICD-10-CM | POA: Diagnosis not present

## 2016-06-05 DIAGNOSIS — Z794 Long term (current) use of insulin: Secondary | ICD-10-CM | POA: Diagnosis not present

## 2016-06-05 DIAGNOSIS — R569 Unspecified convulsions: Secondary | ICD-10-CM | POA: Diagnosis not present

## 2016-06-05 DIAGNOSIS — E119 Type 2 diabetes mellitus without complications: Secondary | ICD-10-CM | POA: Insufficient documentation

## 2016-06-05 DIAGNOSIS — E162 Hypoglycemia, unspecified: Secondary | ICD-10-CM | POA: Diagnosis not present

## 2016-07-16 ENCOUNTER — Ambulatory Visit (INDEPENDENT_AMBULATORY_CARE_PROVIDER_SITE_OTHER): Payer: Medicare Other | Admitting: Endocrinology

## 2016-07-16 ENCOUNTER — Encounter: Payer: Self-pay | Admitting: Endocrinology

## 2016-07-16 VITALS — BP 126/82 | HR 95 | Ht 67.0 in | Wt 162.0 lb

## 2016-07-16 DIAGNOSIS — Z794 Long term (current) use of insulin: Secondary | ICD-10-CM

## 2016-07-16 DIAGNOSIS — E1142 Type 2 diabetes mellitus with diabetic polyneuropathy: Secondary | ICD-10-CM | POA: Diagnosis not present

## 2016-07-16 MED ORDER — INSULIN GLARGINE 100 UNIT/ML SOLOSTAR PEN: 18.0000 [IU] | PEN_INJECTOR | SUBCUTANEOUS | Status: DC

## 2016-07-16 NOTE — Patient Instructions (Addendum)
check your blood sugar twice a day.  vary the time of day when you check, between before the 3 meals, and at bedtime.  also check if you have symptoms of your blood sugar being too high or too low.  please keep a record of the readings and bring it to your next appointment here (or you can bring the meter itself).  You can write it on any piece of paper.  please call us sooner if your blood sugar goes below 70, or if you have a lot of readings over 200.   Please increase lantus to 18 units each morning.   Please carefully watch the area on your left heel, and call Dr Stephanie Acre or I, if the skin breaks.   On this type of insulin schedule, you should eat meals on a regular schedule.  If a meal is missed or significantly delayed, your blood sugar could go low.  Please come back for a follow-up appointment in 6 weeks.

## 2016-07-16 NOTE — Progress Notes (Signed)
Subjective:    Patient ID: Tricia Huynh, female    DOB: 1956/08/03, 60 y.o.   MRN: [id number]  HPI Pt returns for f/u of diabetes mellitus: DM type: Insulin-requiring type 2 Dx'ed: [id number] Complications: polyneuropathy Therapy: insulin since 2010.   GDM: never DKA: never Severe hypoglycemia: once, in 2017.  Pancreatitis: never Other: She takes QD insulin, due to noncompliance; insurance declined V-GO;  Interval history: she denies hypoglycemia.  no cbg record, but states cbg's vary from 130-289.  It is in general higher as the day goes on.  pt states she feels well in general.  Pt says she never misses the insulin.   Past Medical History:  Diagnosis Date  . Diabetes mellitus type 2 in nonobese ([place]) 06/06/2015  . Diabetes mellitus without complication ([place])   . Diabetic neuropathy ([place]) 06/06/2015  . Dyslipidemia 06/06/2015  . Encephalopathy   . Essential hypertension 06/06/2015  . Hypertension   . Mood disorder ([place]) 06/06/2015  . Seizure [facility])    sees [name]. abd eeg, on keppra    Past Surgical History:  Procedure Laterality Date  . ABDOMINAL HYSTERECTOMY    . EYE SURGERY Bilateral   . TUBAL LIGATION      Social History   Social History  . Marital status: Single    Spouse name: N/A  . Number of children: 3  . Years of education: 10   Occupational History  . Disabled    Social History Main Topics  . Smoking status: Former Research scientist (life sciences)  . Smokeless tobacco: Never Used  . Alcohol use No  . Drug use: No  . Sexual activity: Yes   Other Topics Concern  . Not on file   Social History Narrative   Lives at home with her grandchildren.   Right-handed.   No caffeine use.    Current Outpatient Prescriptions on File Prior to Visit  Medication Sig Dispense Refill  . amitriptyline (ELAVIL) 50 MG tablet Take 50 mg by mouth at bedtime.  1  . amLODipine-olmesartan (AZOR) 5-40 MG per tablet Take 1 tablet by mouth daily. Reported on 01/02/2016    . atorvastatin (LIPITOR) 40  MG tablet Take 40 mg by mouth daily.  1  . Blood Glucose Monitoring Suppl (ACCU-CHEK AVIVA) device Use as instructed 1 each 0  . gabapentin (NEURONTIN) 600 MG tablet Take 600 mg by mouth 2 (two) times daily.   0  . glucose blood (ACCU-CHEK AVIVA) test strip 1 each by Other route 2 (two) times daily. And lancets 2/day 100 each 12  . hydrochlorothiazide (HYDRODIURIL) 25 MG tablet Take 1 tablet (25 mg total) by mouth daily. 30 tablet 0  . levETIRAcetam (KEPPRA) 750 MG tablet Take 1 tablet (750 mg total) by mouth 2 (two) times daily. 60 tablet 0  . metoprolol succinate (TOPROL-XL) 25 MG 24 hr tablet Take 50 mg by mouth at bedtime.  1  . sucralfate (CARAFATE) 1 G tablet Take 1 tablet (1 g total) by mouth 4 (four) times daily -  with meals and at bedtime. 30 tablet 0   No current facility-administered medications on file prior to visit.     Allergies  Allergen Reactions  . Percocet [Oxycodone-Acetaminophen] Nausea And Vomiting and Other (See Comments)    Shaky/unsteady.  . Vicodin [Hydrocodone-Acetaminophen] Nausea And Vomiting and Other (See Comments)    Shaky/unsteady.    Family History  Problem Relation Age of Onset  . Heart disease Father   . Kidney disease Father   . Diabetes  Mother   . Seizures Sister   . Seizures Brother     BP 126/82   Pulse 95   Ht 5\' 7"  (1.702 m)   Wt 162 lb (73.5 kg)   SpO2 97%   BMI 25.37 kg/m    Review of Systems She denies hypoglycemia.     Objective:   Physical Exam VITAL SIGNS:  See vs page GENERAL: no distress Pulses: dorsalis pedis intact bilat.   MSK: no deformity of the feet CV: no leg edema Skin:  no ulcer on the feet.  However, at the medial aspect of the left heel, there is a heavy callus, with adjacent exfoliation approx 2 cm diameter.  normal color and temp on the feet.  Neuro: sensation is intact to touch on the feet, but decreased from normal.    Lab Results  Component Value Date   HGBA1C 8.7 (H) 05/26/2016       Assessment  & Plan:  Insulin-requiring type 2 DM: he needs increased rx.  Severe hypoglycemia: in this context, we'll need to slowly re-increase insulin.   Exfoliation of left heel, new.  He is at risk for foot ulcer

## 2016-07-25 ENCOUNTER — Other Ambulatory Visit: Payer: Self-pay | Admitting: Neurology

## 2016-07-25 MED ORDER — LEVETIRACETAM 750 MG PO TABS: 750.0000 mg | ORAL_TABLET | Freq: Two times a day (BID) | ORAL | 5 refills | Status: DC

## 2016-07-25 NOTE — Telephone Encounter (Signed)
Patient called to request refill of levETIRAcetam (KEPPRA) 750 MG tablet

## 2016-07-25 NOTE — Telephone Encounter (Signed)
Tricia Huynh- this patient just saw CM, NP on 05/06/16.

## 2016-07-25 NOTE — Telephone Encounter (Signed)
Spoke to pt and let her know that sent in prescription for keppra 750mg  po bid to [facility]. She verbalized understanding.  (different strength then what she was on, so she has to take less pills).

## 2016-07-25 NOTE — Telephone Encounter (Signed)
Pt called wanting refill.  Looking at note, she was taking 500mg  po am and 1000mg  po pm.  Seen in the ED and they changed to 750mg  po bid.

## 2016-08-19 DIAGNOSIS — E1140 Type 2 diabetes mellitus with diabetic neuropathy, unspecified: Secondary | ICD-10-CM | POA: Diagnosis not present

## 2016-08-19 DIAGNOSIS — M5136 Other intervertebral disc degeneration, lumbar region: Secondary | ICD-10-CM | POA: Diagnosis not present

## 2016-08-19 DIAGNOSIS — G629 Polyneuropathy, unspecified: Secondary | ICD-10-CM | POA: Diagnosis not present

## 2016-08-19 DIAGNOSIS — I10 Essential (primary) hypertension: Secondary | ICD-10-CM | POA: Diagnosis not present

## 2016-08-19 DIAGNOSIS — Z23 Encounter for immunization: Secondary | ICD-10-CM | POA: Diagnosis not present

## 2016-08-27 ENCOUNTER — Ambulatory Visit: Payer: Medicare Other | Admitting: Endocrinology

## 2016-09-04 ENCOUNTER — Ambulatory Visit (INDEPENDENT_AMBULATORY_CARE_PROVIDER_SITE_OTHER): Payer: Medicare Other | Admitting: Endocrinology

## 2016-09-04 ENCOUNTER — Encounter: Payer: Self-pay | Admitting: Endocrinology

## 2016-09-04 VITALS — BP 120/74 | HR 98 | Temp 98.2°F | Ht 67.0 in | Wt 168.6 lb

## 2016-09-04 DIAGNOSIS — Z794 Long term (current) use of insulin: Secondary | ICD-10-CM | POA: Diagnosis not present

## 2016-09-04 DIAGNOSIS — E119 Type 2 diabetes mellitus without complications: Secondary | ICD-10-CM

## 2016-09-04 LAB — POCT GLYCOSYLATED HEMOGLOBIN (HGB A1C): Hemoglobin A1C: 7

## 2016-09-04 NOTE — Progress Notes (Signed)
Subjective:    Patient ID: Tricia Huynh, female    DOB: 1955/12/01, 60 y.o.   MRN: [id number]  HPI Pt returns for f/u of diabetes mellitus: DM type: Insulin-requiring type 2 Dx'ed: 3664 Complications: polyneuropathy Therapy: insulin since 2010.   GDM: never DKA: never Severe hypoglycemia: once, in 2017.  Pancreatitis: never.  Other: She takes QD insulin, due to noncompliance; insurance declined V-GO.  Interval history: no cbg record, but states cbg's are well-controlled.  pt states she feels well in general.  Pt says she never misses the insulin.   Past Medical History:  Diagnosis Date  . Diabetes mellitus type 2 in nonobese ([place]) 06/06/2015  . Diabetes mellitus without complication ([place])   . Diabetic neuropathy ([place]) 06/06/2015  . Dyslipidemia 06/06/2015  . Encephalopathy   . Essential hypertension 06/06/2015  . Hypertension   . Mood disorder ([place]) 06/06/2015  . Seizure [facility])    sees [name]. abd eeg, on keppra    Past Surgical History:  Procedure Laterality Date  . ABDOMINAL HYSTERECTOMY    . EYE SURGERY Bilateral   . TUBAL LIGATION      Social History   Social History  . Marital status: Single    Spouse name: N/A  . Number of children: 3  . Years of education: 10   Occupational History  . Disabled    Social History Main Topics  . Smoking status: Former Research scientist (life sciences)  . Smokeless tobacco: Never Used  . Alcohol use No  . Drug use: No  . Sexual activity: Yes   Other Topics Concern  . Not on file   Social History Narrative   Lives at home with her grandchildren.   Right-handed.   No caffeine use.    Current Outpatient Prescriptions on File Prior to Visit  Medication Sig Dispense Refill  . amitriptyline (ELAVIL) 50 MG tablet Take 50 mg by mouth at bedtime.  1  . amLODipine-olmesartan (AZOR) 5-40 MG per tablet Take 1 tablet by mouth daily. Reported on 01/02/2016    . atorvastatin (LIPITOR) 40 MG tablet Take 40 mg by mouth daily.  1  . Blood Glucose  Monitoring Suppl (ACCU-CHEK AVIVA) device Use as instructed 1 each 0  . gabapentin (NEURONTIN) 600 MG tablet Take 600 mg by mouth 2 (two) times daily.   0  . glucose blood (ACCU-CHEK AVIVA) test strip 1 each by Other route 2 (two) times daily. And lancets 2/day 100 each 12  . hydrochlorothiazide (HYDRODIURIL) 25 MG tablet Take 1 tablet (25 mg total) by mouth daily. 30 tablet 0  . Insulin Glargine (LANTUS SOLOSTAR) 100 UNIT/ML Solostar Pen Inject 18 Units into the skin every morning. 15 mL   . levETIRAcetam (KEPPRA) 750 MG tablet Take 1 tablet (750 mg total) by mouth 2 (two) times daily. 60 tablet 5  . metoprolol succinate (TOPROL-XL) 25 MG 24 hr tablet Take 50 mg by mouth at bedtime.  1  . sucralfate (CARAFATE) 1 G tablet Take 1 tablet (1 g total) by mouth 4 (four) times daily -  with meals and at bedtime. 30 tablet 0   No current facility-administered medications on file prior to visit.     Allergies  Allergen Reactions  . Percocet [Oxycodone-Acetaminophen] Nausea And Vomiting and Other (See Comments)    Shaky/unsteady.  . Vicodin [Hydrocodone-Acetaminophen] Nausea And Vomiting and Other (See Comments)    Shaky/unsteady.    Family History  Problem Relation Age of Onset  . Heart disease Father   . Kidney  disease Father   . Diabetes Mother   . Seizures Sister   . Seizures Brother     BP 120/74 (BP Location: Left Arm, Patient Position: Sitting, Cuff Size: Normal)   Pulse 98   Temp 98.2 F (36.8 C) (Oral)   Ht 5\' 7"  (1.702 m)   Wt 168 lb 9.6 oz (76.5 kg)   SpO2 98%   BMI 26.41 kg/m   Review of Systems She denies hypoglycemia    Objective:   Physical Exam VITAL SIGNS:  See vs page GENERAL: no distress Pulses: dorsalis pedis intact bilat.   MSK: no deformity of the feet CV: no leg edema Skin:  no ulcer on the feet.  normal color and temp on the feet. Neuro: sensation is intact to touch on the feet.   Ext: contusion of the right great toe (object fell on it yesterday).    Lab Results  Component Value Date   HGBA1C 7.0 09/04/2016      Assessment & Plan:  Insulin-requiring type 2 DM, with polyneuropathy: well-controlled.   Patient is advised the following: Patient Instructions  check your blood sugar twice a day.  vary the time of day when you check, between before the 3 meals, and at bedtime.  also check if you have symptoms of your blood sugar being too high or too low.  please keep a record of the readings and bring it to your next appointment here (or you can bring the meter itself).  You can write it on any piece of paper.  please call us sooner if your blood sugar goes below 70, or if you have a lot of readings over 200.   Please continue the same insulin.   On this type of insulin schedule, you should eat meals on a regular schedule.  If a meal is missed or significantly delayed, your blood sugar could go low.  Please come back for a follow-up appointment in 4 months.

## 2016-09-04 NOTE — Patient Instructions (Addendum)
check your blood sugar twice a day.  vary the time of day when you check, between before the 3 meals, and at bedtime.  also check if you have symptoms of your blood sugar being too high or too low.  please keep a record of the readings and bring it to your next appointment here (or you can bring the meter itself).  You can write it on any piece of paper.  please call us sooner if your blood sugar goes below 70, or if you have a lot of readings over 200.   Please continue the same insulin.    On this type of insulin schedule, you should eat meals on a regular schedule.  If a meal is missed or significantly delayed, your blood sugar could go low.   Please come back for a follow-up appointment in 4 months.    

## 2016-09-04 NOTE — Progress Notes (Signed)
Pre visit review using our clinic review tool, if applicable. No additional management support is needed unless otherwise documented below in the visit note. 

## 2016-11-12 ENCOUNTER — Encounter: Payer: Self-pay | Admitting: Nurse Practitioner

## 2016-11-12 ENCOUNTER — Ambulatory Visit (INDEPENDENT_AMBULATORY_CARE_PROVIDER_SITE_OTHER): Payer: Medicare Other | Admitting: Nurse Practitioner

## 2016-11-12 VITALS — BP 121/85 | HR 86 | Ht 67.0 in | Wt 157.0 lb

## 2016-11-12 DIAGNOSIS — R569 Unspecified convulsions: Secondary | ICD-10-CM

## 2016-11-12 DIAGNOSIS — R413 Other amnesia: Secondary | ICD-10-CM | POA: Diagnosis not present

## 2016-11-12 MED ORDER — LEVETIRACETAM 750 MG PO TABS: 750.0000 mg | ORAL_TABLET | Freq: Two times a day (BID) | ORAL | 6 refills | Status: DC

## 2016-11-12 NOTE — Patient Instructions (Addendum)
Continue Keppra 750 twice daily will refill Keep blood sugars in control Follow-up in 6 months

## 2016-11-12 NOTE — Progress Notes (Signed)
I have reviewed and agreed above plan. 

## 2016-11-12 NOTE — Progress Notes (Signed)
[facility]  PATIENT: Tricia Huynh DOB: 1956-04-14   REASON FOR VISIT: follow up for seizure disorder, abnormality of gait, diabetic polyneuropathy HISTORY FROM:patient and daughter    HISTORY OF PRESENT ILLNESS:Tricia Huynh is a 60 years old right-handed female, seen in refer by her primary care physician Dr. Audley Hose for evaluation of memory trouble, seizure, she took scat transportation to office, alone at today's clinical visit in July 04 2015.  She had a history of insulin-dependent diabetes, hypertension, hyperlipidemia, lives with her granddaughters, she has been on disability due to her depression since 1998, had 10 years education, used to work at [employer],  She was admitted to the hospital from July 20 to 21 2016, was found down at home, confused, with bowel and bladder incontinence, she was seen by neuro hospitalists, abnormal EEG in July 2016,focal slowing over the occipital regions, left greater than right, Posterior-dominant photoepileptiform response, asymmetric more on the left occipital region, she was started on Keppra 500 mg twice a day, no recurrent seizure event  I have reviewed MRI of the brain without contrast of July 2016:1. No acute intracranial infarct or other abnormality identified. Mild age-related cerebral atrophy. 2.5 cm well-circumscribed ovoid lesion within the left occipital scalp. This lesion is indeterminate, but most certainly benign.  Laboratory evaluation, creatinine 1. 3, low TSH 0.2 CBC showed mild anemia hemoglobin 11 point 5, negative ammonia, UDS, A1c 11. 8,   UPDATE Oct 04 2015:YY She is with her granddaughter Tricia Huynh, She has one seizure in Oct 02 2015, she was not able to elaborate on details. She lives with her granddaughter, she has gait difficulty. She denies significant pain. She has no bladder incontinence, she has mild constipation. 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 Her migraines has much improved  UPDATE Jan 08 2016:YY She is with her daughter at today's clinical visit, she was diagnosed with UTI, was put on antibiotic, has decreased appetite, generalized weakness, continue has gait difficulty, memory trouble, We have personally reviewed her MRI in July 2016, generalized atrophy, especially along perisylvian fissure MRI of lumbar, mild degenerative changes, no significant foraminal or canal stenosis, EMG nerve conduction study January 2017 consistent with mild axonal peripheral neuropathy, consistent with her poorly controlled diabetes, most recent A1c was 11.9, she has a follow-up appointment with endocrinologist Dr. Hilliard Clark today We reviewed laboratory evaluation, mild anemia 10 point 8, She has no recurrent seizure, able to tolerate Keppra 500/1000 mg every night, she lives with her granddaughter, has home health nurse prepare her medications, UPDATE 06/20/2017CM Ms. Geddis 60 year old female returns for follow-up.She has history of seizure disorder and is currently on Keppra 500 mg 1 in a.m. And 2 PM. Last seizure November 2016. She is also insulin-dependent diabetic and her CBGs this morning 293. EMG consistent with axonal peripheral neuropathy consistent with poorly controlled diabetes.MRI of the brain in  2016 with generalized atrophy.She returns for reevaluation. UPDATE 12/27/2017CM Ms. Koker, 60 year old female returns for follow-up for history of seizure disorder. She is currently on Keppra 750 twice daily. She had a seizure last week after being off her Keppra. She has been off of it for about one week prior to last week last seizure event occurred November  2016. She is also insulin-dependent diabetic but has stopped her insulin in  July according to Dr. Cordelia Pen note. She is now back on therapy but is not consistent with  Checking  her sugars  REVIEW OF SYSTEMS: Full 14 system review of systems performed and notable only for those listed, all others are neg:  Constitutional:  fatigueCardiovascular: neg Ear/Nose/Throat: neg  Skin: neg Eyes: neg Respiratory: neg Gastroitestinal: neg  Hematology/Lymphatic: neg  Endocrine: excessive thirst Musculoskeletal Walking difficulty Allergy/Immunology: neg Neurological: seizure disorder, memory loss weakness Psychiatric: depression, anxiety  Sleep : restless legs ALLERGIES: Allergies  Allergen Reactions  . Percocet [Oxycodone-Acetaminophen] Nausea And Vomiting and Other (See Comments)    Shaky/unsteady.  . Vicodin [Hydrocodone-Acetaminophen] Nausea And Vomiting and Other (See Comments)    Shaky/unsteady.    HOME MEDICATIONS: Outpatient Medications Prior to Visit  Medication Sig Dispense Refill  . amitriptyline (ELAVIL) 50 MG tablet Take 50 mg by mouth at bedtime.  1  . amLODipine-olmesartan (AZOR) 5-40 MG per tablet Take 1 tablet by mouth daily. Reported on 01/02/2016    . atorvastatin (LIPITOR) 40 MG tablet Take 40 mg by mouth daily.  1  . Blood Glucose Monitoring Suppl (ACCU-CHEK AVIVA) device Use as instructed 1 each 0  . gabapentin (NEURONTIN) 600 MG tablet Take 600 mg by mouth 2 (two) times daily.   0  . glucose blood (ACCU-CHEK AVIVA) test strip 1 each by Other route 2 (two) times daily. And lancets 2/day 100 each 12  . hydrochlorothiazide (HYDRODIURIL) 25 MG tablet Take 1 tablet (25 mg total) by mouth daily. 30 tablet 0  . Insulin Glargine (LANTUS SOLOSTAR) 100 UNIT/ML Solostar Pen Inject 18 Units into the skin every morning. 15 mL   . levETIRAcetam (KEPPRA) 750 MG tablet Take 1 tablet (750 mg total) by mouth 2 (two) times daily. 60 tablet 5  . lidocaine (LIDODERM) 5 %     . metoprolol succinate (TOPROL-XL) 25 MG 24 hr tablet Take 50 mg by mouth at bedtime.  1    . sucralfate (CARAFATE) 1 G tablet Take 1 tablet (1 g total) by mouth 4 (four) times daily -  with meals and at bedtime. 30 tablet 0   No facility-administered medications prior to visit.     PAST MEDICAL HISTORY: Past Medical History:  Diagnosis Date  . Diabetes mellitus type 2 in nonobese ([place]) 06/06/2015  . Diabetes mellitus without complication ([place])   . Diabetic neuropathy ([place]) 06/06/2015  . Dyslipidemia 06/06/2015  . Encephalopathy   . Essential hypertension 06/06/2015  . Hypertension   . Mood disorder ([place]) 06/06/2015  . Seizure ([place])    sees [name]. abd eeg, on keppra    PAST SURGICAL HISTORY: Past Surgical History:  Procedure Laterality Date  . ABDOMINAL HYSTERECTOMY    . EYE SURGERY Bilateral   . TUBAL LIGATION      FAMILY HISTORY: Family History  Problem Relation Age of Onset  . Heart disease Father   . Kidney disease Father   . Diabetes Mother   . Seizures Sister   . Seizures Brother     SOCIAL HISTORY: Social History   Social History  . Marital status: Single    Spouse name: N/A  . Number of children: 3  . Years of education: 10   Occupational History  . Disabled    Social History Main Topics  . Smoking status: Former Research scientist (life sciences)  . Smokeless tobacco: Never Used  . Alcohol use No  . Drug use: No  .  Sexual activity: Yes   Other Topics Concern  . Not on file   Social History Narrative   Lives at home with her grandchildren.   Right-handed.   No caffeine use.     PHYSICAL EXAM  Vitals:   11/12/16 1343  BP: 121/85  Pulse: 86  Weight: 157 lb (71.2 kg)  Height: 5\' 7"  (1.702 m)   Body mass index is 24.59 kg/m.  Generalized: Well developed, in no acute distress , well-groomed Head: normocephalic and atraumatic,. Oropharynx benign  Neck: Supple, no carotid bruits  Cardiac: Regular rate rhythm, no murmur  Musculoskeletal: No deformity   Neurological examination   Mentation: Alert oriented to time, place, history taking. Attention span  and concentration appropriate.  Follows all commands speech and language fluent.  Cranial nerve II-XII: Pupils were equal round reactive to light extraocular movements were full, visual field were full on confrontational test. Facial sensation and strength were normal. hearing was intact to finger rubbing bilaterally. Uvula tongue midline. head turning and shoulder shrug were normal and symmetric.Tongue protrusion into cheek strength was normal. Motor: normal bulk and tone,mild bilateral hip flexion weakness Sensory: length dependent decreased light touch and pinprick and vibratory sensation in the lower extremities  Coordination: finger-nose-finger, heel-to-shin bilaterally, no dysmetria Reflexes:  symmetric upper and lower  plantar responses were flexor bilaterally. Gait and Station: Rising up from seated position without assistance, cautious gait   holds onto her daughter  DIAGNOSTIC DATA (LABS, IMAGING, TESTING) - I reviewed patient records, labs, notes, testing and imaging myself where available.  Lab Results  Component Value Date   WBC 6.6 05/26/2016   HGB 10.2 (L) 05/26/2016   HCT 31.2 (L) 05/26/2016   MCV 87.9 05/26/2016   PLT 255 05/26/2016      Component Value Date/Time   NA 140 05/28/2016 0544   K 3.7 05/28/2016 0544   CL 104 05/28/2016 0544   CO2 30 05/28/2016 0544   GLUCOSE 93 05/28/2016 0544   BUN 14 05/28/2016 0544   CREATININE 1.18 (H) 05/28/2016 0544   CALCIUM 9.0 05/28/2016 0544   PROT 7.6 05/26/2016 1315   ALBUMIN 3.5 05/26/2016 1315   AST 31 05/26/2016 1315   ALT 36 05/26/2016 1315   ALKPHOS 165 (H) 05/26/2016 1315   BILITOT 0.5 05/26/2016 1315   GFRNONAA 49 (L) 05/28/2016 0544   GFRAA 57 (L) 05/28/2016 0544    Lab Results  Component Value Date   HGBA1C 7.0 09/04/2016       ASSESSMENT AND PLAN  60 y.o. year old female  has a past medical history of seizure disorder with last seizure occurring in November 2016, Another seizure last week after being  off of her Keppra.Memory loss with MRI of the brain with evidence of generalized atrophy, mood disorder likely plays a role as well. Multifactorial gait disorder from deconditioning and diabetic peripheral neuropathy.    PLAN: Continue Keppra 750 twice daily will refill Keep blood sugars in control Follow-up in 6 months Dennie Bible, [facility], [facility], APRN  [facility] [address], [place], [place] [address] [phone]

## 2016-11-14 ENCOUNTER — Emergency Department (HOSPITAL_COMMUNITY): Payer: Medicare Other

## 2016-11-14 ENCOUNTER — Inpatient Hospital Stay (HOSPITAL_COMMUNITY)
Admission: EM | Admit: 2016-11-14 | Discharge: 2016-11-16 | DRG: 637 | Disposition: A | Discharge status: Home / Self Care | Payer: Medicare Other | Attending: Internal Medicine | Admitting: Internal Medicine

## 2016-11-14 ENCOUNTER — Encounter (HOSPITAL_COMMUNITY): Payer: Self-pay

## 2016-11-14 DIAGNOSIS — E1310 Other specified diabetes mellitus with ketoacidosis without coma: Secondary | ICD-10-CM | POA: Diagnosis not present

## 2016-11-14 DIAGNOSIS — E1110 Type 2 diabetes mellitus with ketoacidosis without coma: Principal | ICD-10-CM

## 2016-11-14 DIAGNOSIS — E1140 Type 2 diabetes mellitus with diabetic neuropathy, unspecified: Secondary | ICD-10-CM | POA: Diagnosis present

## 2016-11-14 DIAGNOSIS — E860 Dehydration: Secondary | ICD-10-CM | POA: Diagnosis present

## 2016-11-14 DIAGNOSIS — G9341 Metabolic encephalopathy: Secondary | ICD-10-CM | POA: Diagnosis present

## 2016-11-14 DIAGNOSIS — R569 Unspecified convulsions: Secondary | ICD-10-CM

## 2016-11-14 DIAGNOSIS — E1122 Type 2 diabetes mellitus with diabetic chronic kidney disease: Secondary | ICD-10-CM | POA: Diagnosis present

## 2016-11-14 DIAGNOSIS — E876 Hypokalemia: Secondary | ICD-10-CM | POA: Diagnosis present

## 2016-11-14 DIAGNOSIS — Z8249 Family history of ischemic heart disease and other diseases of the circulatory system: Secondary | ICD-10-CM

## 2016-11-14 DIAGNOSIS — R739 Hyperglycemia, unspecified: Secondary | ICD-10-CM | POA: Insufficient documentation

## 2016-11-14 DIAGNOSIS — R10A0 Flank pain, unspecified side: Secondary | ICD-10-CM

## 2016-11-14 DIAGNOSIS — Z885 Allergy status to narcotic agent status: Secondary | ICD-10-CM

## 2016-11-14 DIAGNOSIS — N390 Urinary tract infection, site not specified: Secondary | ICD-10-CM

## 2016-11-14 DIAGNOSIS — E785 Hyperlipidemia, unspecified: Secondary | ICD-10-CM | POA: Diagnosis present

## 2016-11-14 DIAGNOSIS — N3000 Acute cystitis without hematuria: Secondary | ICD-10-CM

## 2016-11-14 DIAGNOSIS — Z87891 Personal history of nicotine dependence: Secondary | ICD-10-CM

## 2016-11-14 DIAGNOSIS — R5383 Other fatigue: Secondary | ICD-10-CM

## 2016-11-14 DIAGNOSIS — N179 Acute kidney failure, unspecified: Secondary | ICD-10-CM | POA: Diagnosis present

## 2016-11-14 DIAGNOSIS — Z79899 Other long term (current) drug therapy: Secondary | ICD-10-CM

## 2016-11-14 DIAGNOSIS — R079 Chest pain, unspecified: Secondary | ICD-10-CM

## 2016-11-14 DIAGNOSIS — I129 Hypertensive chronic kidney disease with stage 1 through stage 4 chronic kidney disease, or unspecified chronic kidney disease: Secondary | ICD-10-CM | POA: Diagnosis present

## 2016-11-14 DIAGNOSIS — G40909 Epilepsy, unspecified, not intractable, without status epilepticus: Secondary | ICD-10-CM | POA: Diagnosis present

## 2016-11-14 DIAGNOSIS — I10 Essential (primary) hypertension: Secondary | ICD-10-CM | POA: Diagnosis present

## 2016-11-14 DIAGNOSIS — Z90710 Acquired absence of both cervix and uterus: Secondary | ICD-10-CM

## 2016-11-14 DIAGNOSIS — N183 Chronic kidney disease, stage 3 unspecified: Secondary | ICD-10-CM | POA: Diagnosis present

## 2016-11-14 DIAGNOSIS — R4182 Altered mental status, unspecified: Secondary | ICD-10-CM | POA: Diagnosis present

## 2016-11-14 DIAGNOSIS — F39 Unspecified mood [affective] disorder: Secondary | ICD-10-CM | POA: Diagnosis present

## 2016-11-14 DIAGNOSIS — Z841 Family history of disorders of kidney and ureter: Secondary | ICD-10-CM

## 2016-11-14 DIAGNOSIS — Z833 Family history of diabetes mellitus: Secondary | ICD-10-CM | POA: Diagnosis not present

## 2016-11-14 DIAGNOSIS — R109 Unspecified abdominal pain: Secondary | ICD-10-CM

## 2016-11-14 DIAGNOSIS — Z794 Long term (current) use of insulin: Secondary | ICD-10-CM | POA: Diagnosis not present

## 2016-11-14 DIAGNOSIS — G9340 Encephalopathy, unspecified: Secondary | ICD-10-CM | POA: Diagnosis present

## 2016-11-14 LAB — URINALYSIS, ROUTINE W REFLEX MICROSCOPIC
Bilirubin Urine: NEGATIVE
Glucose, UA: >=500 mg/dL — AB
Hgb urine dipstick: NEGATIVE
Ketones, ur: 5 mg/dL — AB
Nitrite: POSITIVE — AB
Protein, ur: NEGATIVE mg/dL
Specific Gravity, Urine: 1.021 (ref 1.005–1.030)
pH: 5 (ref 5.0–8.0)

## 2016-11-14 LAB — I-STAT CG4 LACTIC ACID, ED: Lactic Acid, Venous: 1.28 mmol/L (ref 0.5–1.9)

## 2016-11-14 LAB — CBC
HCT: 32.5 % — ABNORMAL LOW (ref 36.0–46.0)
Hemoglobin: 11.1 g/dL — ABNORMAL LOW (ref 12.0–15.0)
MCH: 28.9 pg (ref 26.0–34.0)
MCHC: 34.2 g/dL (ref 30.0–36.0)
MCV: 84.6 fL (ref 78.0–100.0)
Platelets: 306 10*3/uL (ref 150–400)
RBC: 3.84 MIL/uL — ABNORMAL LOW (ref 3.87–5.11)
RDW: 12.3 % (ref 11.5–15.5)
WBC: 5.2 10*3/uL (ref 4.0–10.5)

## 2016-11-14 LAB — BETA-HYDROXYBUTYRIC ACID: Beta-Hydroxybutyric Acid: 2.39 mmol/L — ABNORMAL HIGH (ref 0.05–0.27)

## 2016-11-14 LAB — BASIC METABOLIC PANEL
Anion gap: 17 — ABNORMAL HIGH (ref 5–15)
BUN: 36 mg/dL — ABNORMAL HIGH (ref 6–20)
CO2: 20 mmol/L — ABNORMAL LOW (ref 22–32)
Calcium: 9.5 mg/dL (ref 8.9–10.3)
Chloride: 83 mmol/L — ABNORMAL LOW (ref 101–111)
Creatinine, Ser: 1.72 mg/dL — ABNORMAL HIGH (ref 0.44–1.00)
GFR calc Af Amer: 36 mL/min — ABNORMAL LOW (ref >60)
GFR calc non Af Amer: 31 mL/min — ABNORMAL LOW (ref >60)
Glucose, Bld: 928 mg/dL (ref 65–99)
Potassium: 4.9 mmol/L (ref 3.5–5.1)
Sodium: 120 mmol/L — ABNORMAL LOW (ref 135–145)

## 2016-11-14 LAB — I-STAT VENOUS BLOOD GAS, ED
Acid-base deficit: 3 mmol/L — ABNORMAL HIGH (ref 0.0–2.0)
Bicarbonate: 22.6 mmol/L (ref 20.0–28.0)
O2 Saturation: 47 %
TCO2: 24 mmol/L (ref 0–100)
pCO2, Ven: 42.7 mmHg — ABNORMAL LOW (ref 44.0–60.0)
pH, Ven: 7.332 (ref 7.250–7.430)
pO2, Ven: 27 mmHg — CL (ref 32.0–45.0)

## 2016-11-14 LAB — CBG MONITORING, ED
Glucose-Capillary: >600 mg/dL (ref 65–99)
Glucose-Capillary: >600 mg/dL (ref 65–99)
Glucose-Capillary: >600 mg/dL (ref 65–99)

## 2016-11-14 MED ORDER — SODIUM CHLORIDE 0.9 % IV SOLN
INTRAVENOUS | Status: DC
  Administered 2016-11-14: 5.4 [IU]/h via INTRAVENOUS
  Filled 2016-11-14: qty 2.5

## 2016-11-14 MED ORDER — SODIUM CHLORIDE 0.9 % IV BOLUS (SEPSIS)
1000.0000 mL | Freq: Once | INTRAVENOUS | Status: AC
  Administered 2016-11-14: 1000 mL via INTRAVENOUS

## 2016-11-14 MED ORDER — CEFTRIAXONE SODIUM 1 G IJ SOLR
1.0000 g | Freq: Once | INTRAMUSCULAR | Status: AC
  Administered 2016-11-14: 1 g via INTRAVENOUS
  Filled 2016-11-14: qty 10

## 2016-11-14 MED ORDER — SODIUM CHLORIDE 0.9 % IV SOLN
INTRAVENOUS | Status: DC
  Administered 2016-11-14: via INTRAVENOUS

## 2016-11-14 MED ORDER — DEXTROSE-NACL 5-0.45 % IV SOLN: INTRAVENOUS | Status: DC

## 2016-11-14 NOTE — ED Triage Notes (Signed)
Pt complaining of hyperglycemia and hypertension. Per family, pt with transient confusion. Pt a/o x 4. Pt with no complaints at this time.

## 2016-11-14 NOTE — ED Provider Notes (Signed)
[facility] DEPT Provider Note   CSN: [id number] Arrival date & time: 11/14/16  1740     History   Chief Complaint Chief Complaint  Patient presents with  . Altered Mental Status    HPI Shirline Kendle is a 60 y.o. female.  HPI  60 year old female with a history of hypertension, diabetes presents the ED with 1 week of feeling progressively worsening fatigue with associated cough, nasal congestion. Patient also complained of several days of dysuria and suprapubic pain. She also endorses nausea without vomiting. Patient denied any fevers, chills, chest pain, shortness of breath, headache, diarrhea. She denied any alleviating or aggravating factors.  Daughter reports that the patient is been more lethargic over the past week.  Patient reports being compliant with her diabetic medication.  Past Medical History:  Diagnosis Date  . Diabetes mellitus type 2 in nonobese ([place]) 06/06/2015  . Diabetes mellitus without complication ([place])   . Diabetic neuropathy ([place]) 06/06/2015  . Dyslipidemia 06/06/2015  . Encephalopathy   . Essential hypertension 06/06/2015  . Hypertension   . Mood disorder ([place]) 06/06/2015  . Seizure [facility])    sees [name]. abd eeg, on keppra    Patient Active Problem List   Diagnosis Date Noted  . DKA (diabetic ketoacidoses) ([place]) 11/15/2016  . UTI (urinary tract infection) 11/15/2016  . Hyperglycemia 11/14/2016  . Diabetes ([place]) 06/05/2016  . Altered mental status   . CKD (chronic kidney disease) stage 3, GFR 30-59 ml/min 05/26/2016  . Benign essential HTN 05/26/2016  . Dyslipidemia associated with type 2 diabetes mellitus ([place]) 05/26/2016  . Seizures ([place]) 01/08/2016  . Memory loss 10/04/2015  . Acute encephalopathy 06/06/2015  . Diabetic neuropathy ([place]) 06/06/2015    Past Surgical History:  Procedure Laterality Date  . ABDOMINAL HYSTERECTOMY    . EYE SURGERY Bilateral   . TUBAL LIGATION      OB History    No data available       Home  Medications    Prior to Admission medications   Medication Sig Start Date End Date Taking? Authorizing Provider  amitriptyline (ELAVIL) 50 MG tablet Take 50 mg by mouth at bedtime. 05/08/15  Yes Historical Provider, MD  amLODipine-olmesartan (AZOR) 5-40 MG per tablet Take 1 tablet by mouth daily. Reported on 01/02/2016   Yes Historical Provider, MD  atorvastatin (LIPITOR) 40 MG tablet Take 40 mg by mouth daily. 05/10/15  Yes Historical Provider, MD  Blood Glucose Monitoring Suppl (ACCU-CHEK AVIVA) device Use as instructed 01/08/16 01/07/17 Yes Renato Shin, MD  gabapentin (NEURONTIN) 600 MG tablet Take 600 mg by mouth 2 (two) times daily.  10/08/14  Yes Historical Provider, MD  glucose blood (ACCU-CHEK AVIVA) test strip 1 each by Other route 2 (two) times daily. And lancets 2/day 01/08/16  Yes Renato Shin, MD  hydrochlorothiazide (HYDRODIURIL) 25 MG tablet Take 1 tablet (25 mg total) by mouth daily. 05/22/15  Yes Hannah Muthersbaugh, PA-C  Insulin Glargine (LANTUS SOLOSTAR) 100 UNIT/ML Solostar Pen Inject 18 Units into the skin every morning. 07/16/16  Yes Renato Shin, MD  levETIRAcetam (KEPPRA) 750 MG tablet Take 1 tablet (750 mg total) by mouth 2 (two) times daily. 11/12/16  Yes Dennie Bible, NP  lidocaine (LIDODERM) 5 % Place 1 patch onto the skin See admin instructions. Every 24 hours AS NEEDED for lower back pain (12 hours on/12 hours off) 08/19/16  Yes Historical Provider, MD  metoprolol succinate (TOPROL-XL) 25 MG 24 hr tablet Take 50 mg by mouth at bedtime. 05/08/15  Yes Historical Provider, MD  sucralfate (CARAFATE) 1 G tablet Take 1 tablet (1 g total) by mouth 4 (four) times daily -  with meals and at bedtime. Patient not taking: Reported on 11/14/2016 10/18/14   Blanchie Dessert, MD    Family History Family History  Problem Relation Age of Onset  . Heart disease Father   . Kidney disease Father   . Diabetes Mother   . Seizures Sister   . Seizures Brother     Social  History Social History  Substance Use Topics  . Smoking status: Former Research scientist (life sciences)  . Smokeless tobacco: Never Used  . Alcohol use No     Allergies   Percocet [oxycodone-acetaminophen] and Vicodin [hydrocodone-acetaminophen]   Review of Systems Review of Systems Ten systems are reviewed and are negative for acute change except as noted in the HPI   Physical Exam Updated Vital Signs BP 142/85   Pulse 100   Temp 98.8 F (37.1 C) (Oral)   Resp 13   Ht 5\' 7"  (1.702 m)   Wt 155 lb (70.3 kg)   SpO2 100%   BMI 24.28 kg/m   Physical Exam  Constitutional: She is oriented to person, place, and time. She appears well-developed and well-nourished. She appears ill. No distress.  HENT:  Head: Normocephalic and atraumatic.  Nose: Nose normal.  Eyes: Conjunctivae and EOM are normal. Pupils are equal, round, and reactive to light. Right eye exhibits no discharge. Left eye exhibits no discharge. No scleral icterus.  Neck: Normal range of motion. Neck supple.  Cardiovascular: Normal rate and regular rhythm.  Exam reveals no gallop and no friction rub.   No murmur heard. Pulmonary/Chest: Effort normal and breath sounds normal. No stridor. No respiratory distress. She has no rales.  Abdominal: Soft. She exhibits no distension. There is no tenderness.  Musculoskeletal: She exhibits no edema or tenderness.  Neurological: She is alert and oriented to person, place, and time.  Skin: Skin is warm and dry. No rash noted. She is not diaphoretic. No erythema.  Psychiatric: She has a normal mood and affect.  Vitals reviewed.    ED Treatments / Results  Labs (all labs ordered are listed, but only abnormal results are displayed) Labs Reviewed  BASIC METABOLIC PANEL - Abnormal; Notable for the following:       Result Value   Sodium 120 (*)    Chloride 83 (*)    CO2 20 (*)    Glucose, Bld 928 (*)    BUN 36 (*)    Creatinine, Ser 1.72 (*)    GFR calc non Af Amer 31 (*)    GFR calc Af Amer 36  (*)    Anion gap 17 (*)    All other components within normal limits  CBC - Abnormal; Notable for the following:    RBC 3.84 (*)    Hemoglobin 11.1 (*)    HCT 32.5 (*)    All other components within normal limits  URINALYSIS, ROUTINE W REFLEX MICROSCOPIC - Abnormal; Notable for the following:    Color, Urine STRAW (*)    Glucose, UA >=500 (*)    Ketones, ur 5 (*)    Nitrite POSITIVE (*)    Leukocytes, UA TRACE (*)    Bacteria, UA MANY (*)    Squamous Epithelial / LPF 0-5 (*)    All other components within normal limits  BETA-HYDROXYBUTYRIC ACID - Abnormal; Notable for the following:    Beta-Hydroxybutyric Acid 2.39 (*)    All  other components within normal limits  GLUCOSE, CAPILLARY - Abnormal; Notable for the following:    Glucose-Capillary 405 (*)    All other components within normal limits  CBG MONITORING, ED - Abnormal; Notable for the following:    Glucose-Capillary >600 (*)    All other components within normal limits  CBG MONITORING, ED - Abnormal; Notable for the following:    Glucose-Capillary >600 (*)    All other components within normal limits  I-STAT VENOUS BLOOD GAS, ED - Abnormal; Notable for the following:    pCO2, Ven 42.7 (*)    pO2, Ven 27.0 (*)    Acid-base deficit 3.0 (*)    All other components within normal limits  CBG MONITORING, ED - Abnormal; Notable for the following:    Glucose-Capillary >600 (*)    All other components within normal limits  CBG MONITORING, ED - Abnormal; Notable for the following:    Glucose-Capillary 498 (*)    All other components within normal limits  BASIC METABOLIC PANEL  BASIC METABOLIC PANEL  BASIC METABOLIC PANEL  I-STAT CG4 LACTIC ACID, ED    EKG  EKG Interpretation None       Radiology Dg Chest Port 1 View  Result Date: 11/14/2016 CLINICAL DATA:  Chest pain EXAM: PORTABLE CHEST 1 VIEW COMPARISON:  CT 05/26/2016, CXR 05/26/2016 FINDINGS: The heart size and mediastinal contours are within normal limits.  Mild aortic atherosclerosis. Both lungs are clear. The visualized skeletal structures are unremarkable. IMPRESSION: No active disease.  Aortic atherosclerosis. Electronically Signed   By: Ashley Royalty M.D.   On: 11/14/2016 22:00    Procedures Procedures (including critical care time)  Medications Ordered in ED Medications  insulin regular (NOVOLIN R,HUMULIN R) 250 Units in sodium chloride 0.9 % 250 mL (1 Units/mL) infusion (6.9 Units/hr Intravenous Rate/Dose Change 11/15/16 0123)  levETIRAcetam (KEPPRA) tablet 750 mg (not administered)  amitriptyline (ELAVIL) tablet 50 mg (not administered)  atorvastatin (LIPITOR) tablet 40 mg (not administered)  metoprolol succinate (TOPROL-XL) 24 hr tablet 50 mg (not administered)  gabapentin (NEURONTIN) tablet 600 mg (not administered)  amLODipine (NORVASC) tablet 5 mg (not administered)  0.9 %  sodium chloride infusion (not administered)  dextrose 5 %-0.45 % sodium chloride infusion (not administered)  enoxaparin (LOVENOX) injection 40 mg (not administered)  sodium chloride 0.9 % bolus 1,000 mL (0 mLs Intravenous Stopped 11/14/16 2241)    And  sodium chloride 0.9 % bolus 1,000 mL (0 mLs Intravenous Stopped 11/14/16 2340)  cefTRIAXone (ROCEPHIN) 1 g in dextrose 5 % 50 mL IVPB (0 g Intravenous Stopped 11/14/16 2340)     Initial Impression / Assessment and Plan / ED Course  I have reviewed the triage vital signs and the nursing notes.  Pertinent labs & imaging results that were available during my care of the patient were reviewed by me and considered in my medical decision making (see chart for details).  Clinical Course     Workup consistent with urinary tract infection without evidence of sepsis. Patient started on IV Rocephin. Patient also with early DKA versus HHS requiring insulin infusion and IV fluids. Also noted to have acute kidney injury.  Admitted to the stepdown unit and the hospitalist service for continued management.  Final  Clinical Impressions(s) / ED Diagnoses   Final diagnoses:  Flank pain  Lethargy  Acute cystitis without hematuria  Diabetic ketoacidosis without coma associated with type 2 diabetes mellitus ([place])      Fatima Blank, MD 11/15/16 [phone]

## 2016-11-14 NOTE — ED Notes (Signed)
CBG = "high"

## 2016-11-14 NOTE — ED Notes (Signed)
Dr. Leonette Monarch notified on pt.'s elevated blood glucose.

## 2016-11-14 NOTE — ED Notes (Signed)
Portable chest x-ray at bedside at this time. 

## 2016-11-15 ENCOUNTER — Inpatient Hospital Stay (HOSPITAL_COMMUNITY): Payer: Medicare Other

## 2016-11-15 DIAGNOSIS — E1110 Type 2 diabetes mellitus with ketoacidosis without coma: Secondary | ICD-10-CM

## 2016-11-15 DIAGNOSIS — G9340 Encephalopathy, unspecified: Secondary | ICD-10-CM

## 2016-11-15 DIAGNOSIS — N390 Urinary tract infection, site not specified: Secondary | ICD-10-CM

## 2016-11-15 DIAGNOSIS — N179 Acute kidney failure, unspecified: Secondary | ICD-10-CM

## 2016-11-15 DIAGNOSIS — R569 Unspecified convulsions: Secondary | ICD-10-CM

## 2016-11-15 DIAGNOSIS — N3000 Acute cystitis without hematuria: Secondary | ICD-10-CM

## 2016-11-15 DIAGNOSIS — E1310 Other specified diabetes mellitus with ketoacidosis without coma: Secondary | ICD-10-CM

## 2016-11-15 DIAGNOSIS — E0811 Diabetes mellitus due to underlying condition with ketoacidosis with coma: Secondary | ICD-10-CM

## 2016-11-15 LAB — BASIC METABOLIC PANEL
Anion gap: 9 (ref 5–15)
Anion gap: 8 (ref 5–15)
Anion gap: 7 (ref 5–15)
BUN: 30 mg/dL — ABNORMAL HIGH (ref 6–20)
BUN: 27 mg/dL — ABNORMAL HIGH (ref 6–20)
BUN: 24 mg/dL — ABNORMAL HIGH (ref 6–20)
CO2: 24 mmol/L (ref 22–32)
CO2: 25 mmol/L (ref 22–32)
CO2: 24 mmol/L (ref 22–32)
Calcium: 8.9 mg/dL (ref 8.9–10.3)
Calcium: 9 mg/dL (ref 8.9–10.3)
Calcium: 8.9 mg/dL (ref 8.9–10.3)
Chloride: 100 mmol/L — ABNORMAL LOW (ref 101–111)
Chloride: 104 mmol/L (ref 101–111)
Chloride: 103 mmol/L (ref 101–111)
Creatinine, Ser: 1.42 mg/dL — ABNORMAL HIGH (ref 0.44–1.00)
Creatinine, Ser: 1.28 mg/dL — ABNORMAL HIGH (ref 0.44–1.00)
Creatinine, Ser: 1.18 mg/dL — ABNORMAL HIGH (ref 0.44–1.00)
GFR calc Af Amer: 45 mL/min — ABNORMAL LOW (ref >60)
GFR calc Af Amer: 52 mL/min — ABNORMAL LOW (ref >60)
GFR calc Af Amer: 57 mL/min — ABNORMAL LOW (ref >60)
GFR calc non Af Amer: 39 mL/min — ABNORMAL LOW (ref >60)
GFR calc non Af Amer: 45 mL/min — ABNORMAL LOW (ref >60)
GFR calc non Af Amer: 49 mL/min — ABNORMAL LOW (ref >60)
Glucose, Bld: 295 mg/dL — ABNORMAL HIGH (ref 65–99)
Glucose, Bld: 133 mg/dL — ABNORMAL HIGH (ref 65–99)
Glucose, Bld: 254 mg/dL — ABNORMAL HIGH (ref 65–99)
Potassium: 3.4 mmol/L — ABNORMAL LOW (ref 3.5–5.1)
Potassium: 3.2 mmol/L — ABNORMAL LOW (ref 3.5–5.1)
Potassium: 3.4 mmol/L — ABNORMAL LOW (ref 3.5–5.1)
Sodium: 133 mmol/L — ABNORMAL LOW (ref 135–145)
Sodium: 137 mmol/L (ref 135–145)
Sodium: 134 mmol/L — ABNORMAL LOW (ref 135–145)

## 2016-11-15 LAB — GLUCOSE, CAPILLARY
Glucose-Capillary: 405 mg/dL — ABNORMAL HIGH (ref 65–99)
Glucose-Capillary: 293 mg/dL — ABNORMAL HIGH (ref 65–99)
Glucose-Capillary: 192 mg/dL — ABNORMAL HIGH (ref 65–99)
Glucose-Capillary: 131 mg/dL — ABNORMAL HIGH (ref 65–99)
Glucose-Capillary: 121 mg/dL — ABNORMAL HIGH (ref 65–99)
Glucose-Capillary: 127 mg/dL — ABNORMAL HIGH (ref 65–99)
Glucose-Capillary: 182 mg/dL — ABNORMAL HIGH (ref 65–99)
Glucose-Capillary: 232 mg/dL — ABNORMAL HIGH (ref 65–99)
Glucose-Capillary: 237 mg/dL — ABNORMAL HIGH (ref 65–99)
Glucose-Capillary: 215 mg/dL — ABNORMAL HIGH (ref 65–99)
Glucose-Capillary: 143 mg/dL — ABNORMAL HIGH (ref 65–99)
Glucose-Capillary: 112 mg/dL — ABNORMAL HIGH (ref 65–99)
Glucose-Capillary: 77 mg/dL (ref 65–99)
Glucose-Capillary: 279 mg/dL — ABNORMAL HIGH (ref 65–99)
Glucose-Capillary: 347 mg/dL — ABNORMAL HIGH (ref 65–99)
Glucose-Capillary: 309 mg/dL — ABNORMAL HIGH (ref 65–99)

## 2016-11-15 LAB — MRSA PCR SCREENING: MRSA by PCR: NEGATIVE

## 2016-11-15 LAB — CBG MONITORING, ED: Glucose-Capillary: 498 mg/dL — ABNORMAL HIGH (ref 65–99)

## 2016-11-15 MED ORDER — ENOXAPARIN SODIUM 40 MG/0.4ML ~~LOC~~ SOLN
40.0000 mg | SUBCUTANEOUS | Status: DC
  Administered 2016-11-15 – 2016-11-16 (×2): 40 mg via SUBCUTANEOUS
  Filled 2016-11-15 (×2): qty 0.4

## 2016-11-15 MED ORDER — DEXTROSE 5 % IV SOLN
1.0000 g | Freq: Every day | INTRAVENOUS | Status: DC
  Administered 2016-11-15: 1 g via INTRAVENOUS
  Filled 2016-11-15: qty 10

## 2016-11-15 MED ORDER — LEVETIRACETAM 750 MG PO TABS
750.0000 mg | ORAL_TABLET | Freq: Two times a day (BID) | ORAL | Status: DC
  Administered 2016-11-15 – 2016-11-16 (×3): 750 mg via ORAL
  Filled 2016-11-15 (×3): qty 1

## 2016-11-15 MED ORDER — ATORVASTATIN CALCIUM 40 MG PO TABS
40.0000 mg | ORAL_TABLET | Freq: Every day | ORAL | Status: DC
  Administered 2016-11-15: 40 mg via ORAL
  Filled 2016-11-15: qty 1

## 2016-11-15 MED ORDER — SODIUM CHLORIDE 0.9 % IV SOLN
30.0000 meq | Freq: Two times a day (BID) | INTRAVENOUS | Status: AC
  Administered 2016-11-15 (×2): 30 meq via INTRAVENOUS
  Filled 2016-11-15 (×2): qty 15

## 2016-11-15 MED ORDER — AMLODIPINE BESYLATE 5 MG PO TABS
5.0000 mg | ORAL_TABLET | Freq: Every day | ORAL | Status: DC
  Administered 2016-11-15 – 2016-11-16 (×2): 5 mg via ORAL
  Filled 2016-11-15 (×2): qty 1

## 2016-11-15 MED ORDER — METOPROLOL SUCCINATE ER 50 MG PO TB24
50.0000 mg | ORAL_TABLET | Freq: Every day | ORAL | Status: DC
  Administered 2016-11-15: 50 mg via ORAL
  Filled 2016-11-15: qty 1

## 2016-11-15 MED ORDER — SODIUM CHLORIDE 0.9 % IV SOLN
INTRAVENOUS | Status: DC
  Administered 2016-11-15: 02:00:00 via INTRAVENOUS

## 2016-11-15 MED ORDER — INSULIN GLARGINE 100 UNIT/ML ~~LOC~~ SOLN
10.0000 [IU] | Freq: Once | SUBCUTANEOUS | Status: AC
  Administered 2016-11-15: 10 [IU] via SUBCUTANEOUS
  Filled 2016-11-15: qty 0.1

## 2016-11-15 MED ORDER — AMITRIPTYLINE HCL 25 MG PO TABS
50.0000 mg | ORAL_TABLET | Freq: Every day | ORAL | Status: DC
  Administered 2016-11-15: 50 mg via ORAL
  Filled 2016-11-15: qty 2

## 2016-11-15 MED ORDER — INSULIN ASPART 100 UNIT/ML ~~LOC~~ SOLN
0.0000 [IU] | SUBCUTANEOUS | Status: DC
  Administered 2016-11-15 (×2): 15 [IU] via SUBCUTANEOUS
  Administered 2016-11-16: 4 [IU] via SUBCUTANEOUS
  Administered 2016-11-16: 3 [IU] via SUBCUTANEOUS

## 2016-11-15 MED ORDER — DEXTROSE-NACL 5-0.45 % IV SOLN
INTRAVENOUS | Status: DC
  Administered 2016-11-15: 04:00:00 via INTRAVENOUS

## 2016-11-15 MED ORDER — GABAPENTIN 600 MG PO TABS
600.0000 mg | ORAL_TABLET | Freq: Two times a day (BID) | ORAL | Status: DC
  Administered 2016-11-15 – 2016-11-16 (×3): 600 mg via ORAL
  Filled 2016-11-15 (×3): qty 1

## 2016-11-15 NOTE — Progress Notes (Signed)
Pharmacy Antibiotic Note  Tricia Huynh is a 60 y.o. female admitted on 11/14/2016 with UTI.  Pharmacy has been consulted for Rocephin dosing.  Rocephin 1gm IV given in ED ~2300  Plan: Rocephin 1gm IV q24h Pharmacy will sign off - please reconsult if needed  Height: 5\' 7"  (170.2 cm) Weight: 155 lb (70.3 kg) IBW/kg (Calculated) : 61.6  Temp (24hrs), Avg:98.4 F (36.9 C), Min:97.9 F (36.6 C), Max:98.8 F (37.1 C)   Recent Labs Lab 11/14/16 1923 11/14/16 2300  WBC 5.2  --   CREATININE 1.72*  --   LATICACIDVEN  --  1.28    Estimated Creatinine Clearance: 33.8 mL/min (by C-G formula based on SCr of 1.72 mg/dL (H)).    Allergies  Allergen Reactions  . Percocet [Oxycodone-Acetaminophen] Nausea And Vomiting and Other (See Comments)    Shaky/unsteady.  . Vicodin [Hydrocodone-Acetaminophen] Nausea And Vomiting and Other (See Comments)    Shaky/unsteady.    Thank you for allowing pharmacy to be a part of this patient's care.  Sherlon Handing, PharmD, BCPS Clinical pharmacist, pager [phone] 11/15/2016 1:41 AM

## 2016-11-15 NOTE — ED Notes (Signed)
Hospitalist at bedside 

## 2016-11-15 NOTE — H&P (Addendum)
History and Physical    Tricia Huynh WPV:[id number] DOB: November 08, 1956 DOA: 11/14/2016  PCP: Lilian Coma, MD   Patient coming from: HOME  Chief Complaint: Altered mental status, lethargy, decreased PO intake  HPI: Tricia Huynh is a 60 y.o. woman with a history of HTN, DM, dyslipidemia, and seizure disorder (has just seen her neurologist in clinic, on Keppra 750mg  BID) who presents to the ED accompanied by her daughter for evaluation of one week of progressive weakness, fatigue, decreased PO intake, and intermittent confusion.  She has had nausea but no vomiting.  She had a syncopal episode prior to presentation.  No chest pain or shortness of breath.  No fevers, chills, or sweats.  She has had right sided abdominal and flank pain, but she denies lower urinary tract symptoms.  She is also developing a sore throat and cough.  ED Course: She has an abnormal U/A consistent with UTI.  She also has evidence of DKA with blood sugar 928, bicarb 20.  Sodium 120.  She has AKI with elevated BUN of 36 and creatinine of 1.72 (up from baseline of 14 and 1.18, respectively).  She has a normal pH on VBG.  Normal lactic acid.  Elevated beta hydroxybutyric acid level to 2.39.  She has received 2L of NS.  She is now on a maintenance rate of 125cc/hr.  She is on an insulin infusion per protocol.  She has received empiric IV Rocephin.  Hospitalist asked to admit.  Review of Systems: As per HPI otherwise 10 point review of systems negative.    Past Medical History:  Diagnosis Date  . Diabetes mellitus type 2 in nonobese ([place]) 06/06/2015  . Diabetes mellitus without complication ([place])   . Diabetic neuropathy ([place]) 06/06/2015  . Dyslipidemia 06/06/2015  . Encephalopathy   . Essential hypertension 06/06/2015  . Hypertension   . Mood disorder ([place]) 06/06/2015  . Seizure [facility])    sees [name]. abd eeg, on keppra    Past Surgical History:  Procedure Laterality Date  . ABDOMINAL HYSTERECTOMY    .  EYE SURGERY Bilateral   . TUBAL LIGATION       reports that she has quit smoking. She has never used smokeless tobacco. She reports that she does not drink alcohol or use drugs.  She is not married.  She has three children.  She lives with one of her daughters.  Allergies  Allergen Reactions  . Percocet [Oxycodone-Acetaminophen] Nausea And Vomiting and Other (See Comments)    Shaky/unsteady.  . Vicodin [Hydrocodone-Acetaminophen] Nausea And Vomiting and Other (See Comments)    Shaky/unsteady.    Family History  Problem Relation Age of Onset  . Heart disease Father   . Kidney disease Father   . Diabetes Mother   . Seizures Sister   . Seizures Brother      Prior to Admission medications   Medication Sig Start Date End Date Taking? Authorizing Provider  amitriptyline (ELAVIL) 50 MG tablet Take 50 mg by mouth at bedtime. 05/08/15  Yes Historical Provider, MD  amLODipine-olmesartan (AZOR) 5-40 MG per tablet Take 1 tablet by mouth daily. Reported on 01/02/2016   Yes Historical Provider, MD  atorvastatin (LIPITOR) 40 MG tablet Take 40 mg by mouth daily. 05/10/15  Yes Historical Provider, MD  Blood Glucose Monitoring Suppl (ACCU-CHEK AVIVA) device Use as instructed 01/08/16 01/07/17 Yes Renato Shin, MD  gabapentin (NEURONTIN) 600 MG tablet Take 600 mg by mouth 2 (two) times daily.  10/08/14  Yes Historical Provider,  MD  glucose blood (ACCU-CHEK AVIVA) test strip 1 each by Other route 2 (two) times daily. And lancets 2/day 01/08/16  Yes Renato Shin, MD  hydrochlorothiazide (HYDRODIURIL) 25 MG tablet Take 1 tablet (25 mg total) by mouth daily. 05/22/15  Yes Hannah Muthersbaugh, PA-C  Insulin Glargine (LANTUS SOLOSTAR) 100 UNIT/ML Solostar Pen Inject 18 Units into the skin every morning. 07/16/16  Yes Renato Shin, MD  levETIRAcetam (KEPPRA) 750 MG tablet Take 1 tablet (750 mg total) by mouth 2 (two) times daily. 11/12/16  Yes Dennie Bible, NP  lidocaine (LIDODERM) 5 % Place 1 patch onto  the skin See admin instructions. Every 24 hours AS NEEDED for lower back pain (12 hours on/12 hours off) 08/19/16  Yes Historical Provider, MD  metoprolol succinate (TOPROL-XL) 25 MG 24 hr tablet Take 50 mg by mouth at bedtime. 05/08/15  Yes Historical Provider, MD  sucralfate (CARAFATE) 1 G tablet Take 1 tablet (1 g total) by mouth 4 (four) times daily -  with meals and at bedtime. Patient not taking: Reported on 11/14/2016 10/18/14   Blanchie Dessert, MD    Physical Exam: Vitals:   11/14/16 2315 11/14/16 2330 11/15/16 0000 11/15/16 0030  BP: 131/86 126/84 132/87 142/85  Pulse: 87 85 87 100  Resp: 12 14 15 13   Temp:      TempSrc:      SpO2: 100% 100% 100% 100%  Weight:      Height:          Constitutional: NAD, calm, comfortable Vitals:   11/14/16 2315 11/14/16 2330 11/15/16 0000 11/15/16 0030  BP: 131/86 126/84 132/87 142/85  Pulse: 87 85 87 100  Resp: 12 14 15 13   Temp:      TempSrc:      SpO2: 100% 100% 100% 100%  Weight:      Height:       Eyes: PERRL, lids and conjunctivae normal ENMT: Mucous membranes are DRY. Posterior pharynx clear of any exudate or lesions. Normal dentition.  Neck: normal appearance, supple Respiratory: clear to auscultation bilaterally, no wheezing, no crackles. Normal respiratory effort. No accessory muscle use.  Cardiovascular: Normal rate, regular rhythm, no murmurs / rubs / gallops. No extremity edema. 2+ pedal pulses. No carotid bruits.  GI: abdomen is soft and compressible.  No distention.  She has right CVA tenderness.  No masses palpated.  Bowel sounds are present. Musculoskeletal:  No joint deformity in upper and lower extremities. Good ROM, no contractures. Normal muscle tone.  Skin: no rashes, warm and dry Neurologic: CN 2-12 grossly intact. Sensation intact, Strength symmetric bilaterally, 5/5  Psychiatric: Flat affect but she is oriented x 3.  She seems to have appropriate insight at this time.    Labs on Admission: I have personally  reviewed following labs and imaging studies  CBC:  Recent Labs Lab 11/14/16 1923  WBC 5.2  HGB 11.1*  HCT 32.5*  MCV 84.6  PLT 892   Basic Metabolic Panel:  Recent Labs Lab 11/14/16 1923  NA 120*  K 4.9  CL 83*  CO2 20*  GLUCOSE 928*  BUN 36*  CREATININE 1.72*  CALCIUM 9.5   GFR: Estimated Creatinine Clearance: 33.8 mL/min (by C-G formula based on SCr of 1.72 mg/dL (H)).  CBG:  Recent Labs Lab 11/14/16 1914 11/14/16 2128 11/14/16 2308 11/15/16 0013 11/15/16 0121  GLUCAP >600* >600* >600* 498* 405*   Urine analysis:    Component Value Date/Time   COLORURINE STRAW (A) 11/14/2016 2110  APPEARANCEUR CLEAR 11/14/2016 2110   LABSPEC 1.021 11/14/2016 2110   PHURINE 5.0 11/14/2016 2110   GLUCOSEU >=500 (A) 11/14/2016 2110   HGBUR NEGATIVE 11/14/2016 2110   BILIRUBINUR NEGATIVE 11/14/2016 2110   KETONESUR 5 (A) 11/14/2016 2110   PROTEINUR NEGATIVE 11/14/2016 2110   UROBILINOGEN 0.2 09/18/2015 1327   NITRITE POSITIVE (A) 11/14/2016 2110   LEUKOCYTESUR TRACE (A) 11/14/2016 2110   Sepsis Labs:  Lactic acid level 1.28  Radiological Exams on Admission: Dg Chest Port 1 View  Result Date: 11/14/2016 CLINICAL DATA:  Chest pain EXAM: PORTABLE CHEST 1 VIEW COMPARISON:  CT 05/26/2016, CXR 05/26/2016 FINDINGS: The heart size and mediastinal contours are within normal limits. Mild aortic atherosclerosis. Both lungs are clear. The visualized skeletal structures are unremarkable. IMPRESSION: No active disease.  Aortic atherosclerosis. Electronically Signed   By: Ashley Royalty M.D.   On: 11/14/2016 22:00   Assessment/Plan Principal Problem:   DKA (diabetic ketoacidoses) (H[facility]) Active Problems:   Acute encephalopathy   Seizures ([facility])   CKD (chronic kidney disease) stage 3, GFR 30-59 ml/min   Benign essential HTN   Dyslipidemia associated with type 2 diabetes mellitus ([facility])   UTI (urinary tract infection)      DKA --IV insulin per protocol --IV fluids per  protocol --NPO until she off the insulin infusion  Acute encephalopathy attributed to hyperglycemia and UTI, possibly pyelonephritis (she has right CVA tenderness) --IV Rocephin --Urine culture --Renal ultrasound  History of seizure --Continue keppra  AKI --Hold diuretics, ARB --Hydrate with NS --Repeat BMP in the AM  HTN --BB, amlodipine  HLD --Statin    DVT prophylaxis: Lovenox Code Status: FULL Family Communication: daughter at bedside in the ED at time of admission. Disposition Plan: To be determined. Consults called: NONE Admission status: Inpatient, stepdown unit   TIME SPENT: 70 minutes   Eber Jones MD [facility] Pager [phone]  If 7PM-7AM, please contact night-coverage [email or web address] Password [email or web address]  11/15/2016, 1:39 AM

## 2016-11-15 NOTE — ED Notes (Signed)
Attempted to call report to [place] x 1.

## 2016-11-15 NOTE — Progress Notes (Signed)
PROGRESS NOTE    Tricia Huynh  ZOX:[id number] DOB: [birth year] DOA: 11/14/2016 PCP: Lilian Coma, MD   Brief Narrative:  60 y.o. BF PMHx HTN, DM Type 2 with complication, HTN, Dyslipidemia, and seizure disorder (has just seen her neurologist in clinic, on Keppra 750mg  BID), Mood disorder   Who presents to the ED accompanied by her daughter for evaluation of one week of progressive weakness, fatigue, decreased PO intake, and intermittent confusion.  She has had nausea but no vomiting.  She had a syncopal episode prior to presentation.  No chest pain or shortness of breath.  No fevers, chills, or sweats.  She has had right sided abdominal and flank pain, but she denies lower urinary tract symptoms.  She is also developing a sore throat and cough.  ED Course: She has an abnormal U/A consistent with UTI.  She also has evidence of DKA with blood sugar 928, bicarb 20.  Sodium 120.  She has AKI with elevated BUN of 36 and creatinine of 1.72 (up from baseline of 14 and 1.18, respectively).  She has a normal pH on VBG.  Normal lactic acid.  Elevated beta hydroxybutyric acid level to 2.39.  She has received 2L of NS.  She is now on a maintenance rate of 125cc/hr.  She is on an insulin infusion per protocol.  She has received empiric IV Rocephin.  Hospitalist asked to admit.   Subjective: 12/30 A/O 4, NAD. Daughter states on episode initially started when patient ran out of her seizure medication. Patient had contact the PCP to request refill and PCP had difficulty getting pharmacy to refill patient's medication. Unfortunately patient was able to restart her medication after approximately one week break but appears she was developing UTI at that time. Patient states positive LOC. Daughter believes secondary to increasing N/V which prohibited patient from taking her PO medication.   Assessment & Plan:   Principal Problem:   DKA (diabetic ketoacidoses) ([place]) Active Problems:   Acute  encephalopathy   Seizures (HCC)   CKD (chronic kidney disease) stage 3, GFR 30-59 ml/min   Benign essential HTN   Dyslipidemia associated with type 2 diabetes mellitus ([place])   UTI (urinary tract infection)   DKA/DM type II uncontrolled with complication -Resolved -Patient received Lantus 10 units as she was transitioning off of glucose stabilizer protocol -Resistant SSI -  Acute Encephalopathy -Multifactorial, DKA, acute renal failure, UTI. -Resolved  Seizure --Continue keppra -No evidence of seizure activity prior to admission or during this admission  Acute renal failure (baseline Cr ~1)  --Multifactorial infection, DKA, dehydration. -Hold diuretics, ARB --Hydrate with NS her glucose stabilizer protocol -Continue current antibiotic -Urine culture pending -Renal ultrasound: Normal  HTN -Amlodipine 5 mg daily -Toprol 50 mg daily  HLD  -Lipitor 40 mg daily  Hypokalemia  -Potassium IV 60 mEq   DVT prophylaxis: Lovenox Code Status: Full Family Communication: Children at bedside Disposition Plan: Discharge next 24-48 hours   Consultants:  NA   Procedures/Significant Events:  12/30 Renal Ultrasound: Normal     VENTILATOR SETTINGS: NA   Cultures 12/29 urine pending   Antimicrobials: Anti-infectives    Start     Stop   11/15/16 2200  cefTRIAXone (ROCEPHIN) 1 g in dextrose 5 % 50 mL IVPB         11/14/16 2245  cefTRIAXone (ROCEPHIN) 1 g in dextrose 5 % 50 mL IVPB     11/14/16 2340       Devices NA   LINES /  TUBES:  NA    Continuous Infusions: . sodium chloride Stopped (11/15/16 0429)  . dextrose 5 % and 0.45% NaCl 125 mL/hr at 11/15/16 0600  . insulin (NOVOLIN-R) infusion Stopped (11/15/16 0600)     Objective: Vitals:   11/14/16 2330 11/15/16 0000 11/15/16 0030 11/15/16 0325  BP: 126/84 132/87 142/85 118/73  Pulse: 85 87 100 81  Resp: 14 15 13 14   Temp:   97.9 F (36.6 C) 98.1 F (36.7 C)  TempSrc:   Oral Oral  SpO2: 100%  100% 100% 98%  Weight:      Height:        Intake/Output Summary (Last 24 hours) at 11/15/16 0802 Last data filed at 11/15/16 0600  Gross per 24 hour  Intake          2574.21 ml  Output                0 ml  Net          2574.21 ml   Filed Weights   11/14/16 2124  Weight: 70.3 kg (155 lb)    Examination:  General: A/O 4, NAD, No acute respiratory distress Eyes: negative scleral hemorrhage, negative anisocoria, negative icterus ENT: Negative Runny nose, negative gingival bleeding, Neck:  Negative scars, masses, torticollis, lymphadenopathy, JVD Lungs: Clear to auscultation bilaterally without wheezes or crackles Cardiovascular: Regular rate and rhythm without murmur gallop or rub normal S1 and S2 Abdomen: Positive suprapubic abdominal pain, positive right CVA tenderness, nondistended, positive soft, bowel sounds, no rebound, no ascites, no appreciable mass Extremities: No significant cyanosis, clubbing, or edema bilateral lower extremities Skin: Negative rashes, lesions, ulcers Psychiatric:  Negative depression, negative anxiety, negative fatigue, negative mania * Central nervous system:  Cranial nerves II through XII intact, tongue/uvula midline, all extremities muscle strength 5/5, sensation intact throughout, negative dysarthria, negative expressive aphasia, negative receptive aphasia.  .     Data Reviewed: Care during the described time interval was provided by me .  I have reviewed this patient's available data, including medical history, events of note, physical examination, and all test results as part of my evaluation. I have personally reviewed and interpreted all radiology studies.  CBC:  Recent Labs Lab 11/14/16 1923  WBC 5.2  HGB 11.1*  HCT 32.5*  MCV 84.6  PLT 371   Basic Metabolic Panel:  Recent Labs Lab 11/14/16 1923 11/15/16 0202 11/15/16 0515  NA 120* 133* 137  K 4.9 3.4* 3.2*  CL 83* 100* 104  CO2 20* 24 25  GLUCOSE 928* 295* 133*  BUN 36*  30* 27*  CREATININE 1.72* 1.42* 1.28*  CALCIUM 9.5 8.9 9.0   GFR: Estimated Creatinine Clearance: 45.5 mL/min (by C-G formula based on SCr of 1.28 mg/dL (H)). Liver Function Tests: No results for input(s): AST, ALT, ALKPHOS, BILITOT, PROT, ALBUMIN in the last 168 hours. No results for input(s): LIPASE, AMYLASE in the last 168 hours. No results for input(s): AMMONIA in the last 168 hours. Coagulation Profile: No results for input(s): INR, PROTIME in the last 168 hours. Cardiac Enzymes: No results for input(s): CKTOTAL, CKMB, CKMBINDEX, TROPONINI in the last 168 hours. BNP (last 3 results) No results for input(s): PROBNP in the last 8760 hours. HbA1C: No results for input(s): HGBA1C in the last 72 hours. CBG:  Recent Labs Lab 11/15/16 0323 11/15/16 0427 11/15/16 0531 11/15/16 0609 11/15/16 0716  GLUCAP 192* 131* 127* 121* 182*   Lipid Profile: No results for input(s): CHOL, HDL, LDLCALC, TRIG, CHOLHDL, LDLDIRECT in  the last 72 hours. Thyroid Function Tests: No results for input(s): TSH, T4TOTAL, FREET4, T3FREE, THYROIDAB in the last 72 hours. Anemia Panel: No results for input(s): VITAMINB12, FOLATE, FERRITIN, TIBC, IRON, RETICCTPCT in the last 72 hours. Urine analysis:    Component Value Date/Time   COLORURINE STRAW (A) 11/14/2016 2110   APPEARANCEUR CLEAR 11/14/2016 2110   LABSPEC 1.021 11/14/2016 2110   PHURINE 5.0 11/14/2016 2110   GLUCOSEU >=500 (A) 11/14/2016 2110   HGBUR NEGATIVE 11/14/2016 2110   [place] NEGATIVE 11/14/2016 2110   KETONESUR 5 (A) 11/14/2016 2110   PROTEINUR NEGATIVE 11/14/2016 2110   UROBILINOGEN 0.2 09/18/2015 1327   NITRITE POSITIVE (A) 11/14/2016 2110   LEUKOCYTESUR TRACE (A) 11/14/2016 2110   Sepsis Labs: @LABRCNTIP (procalcitonin:4,lacticidven:4)  )No results found for this or any previous visit (from the past 240 hour(s)).       Radiology Studies: Dg Chest Port 1 View  Result Date: 11/14/2016 CLINICAL DATA:  Chest pain  EXAM: PORTABLE CHEST 1 VIEW COMPARISON:  CT 05/26/2016, CXR 05/26/2016 FINDINGS: The heart size and mediastinal contours are within normal limits. Mild aortic atherosclerosis. Both lungs are clear. The visualized skeletal structures are unremarkable. IMPRESSION: No active disease.  Aortic atherosclerosis. Electronically Signed   By: Ashley Royalty M.D.   On: 11/14/2016 22:00        Scheduled Meds: . amitriptyline  50 mg Oral QHS  . amLODipine  5 mg Oral Daily  . atorvastatin  40 mg Oral q1800  . cefTRIAXone (ROCEPHIN)  IV  1 g Intravenous QHS  . enoxaparin (LOVENOX) injection  40 mg Subcutaneous Q24H  . gabapentin  600 mg Oral BID  . levETIRAcetam  750 mg Oral BID  . metoprolol succinate  50 mg Oral QHS   Continuous Infusions: . sodium chloride Stopped (11/15/16 0429)  . dextrose 5 % and 0.45% NaCl 125 mL/hr at 11/15/16 0600  . insulin (NOVOLIN-R) infusion Stopped (11/15/16 0600)     LOS: 1 day    Time spent: 40 minutes    Amauri Medellin, Geraldo Docker, MD [facility] Pager [phone]   If 7PM-7AM, please contact night-coverage [email or web address] Password [email or web address] 11/15/2016, 8:02 AM

## 2016-11-16 DIAGNOSIS — E1110 Type 2 diabetes mellitus with ketoacidosis without coma: Secondary | ICD-10-CM

## 2016-11-16 DIAGNOSIS — I10 Essential (primary) hypertension: Secondary | ICD-10-CM

## 2016-11-16 LAB — BASIC METABOLIC PANEL
Anion gap: 3 — ABNORMAL LOW (ref 5–15)
BUN: 15 mg/dL (ref 6–20)
CO2: 27 mmol/L (ref 22–32)
Calcium: 9.4 mg/dL (ref 8.9–10.3)
Chloride: 109 mmol/L (ref 101–111)
Creatinine, Ser: 1.14 mg/dL — ABNORMAL HIGH (ref 0.44–1.00)
GFR calc Af Amer: 59 mL/min — ABNORMAL LOW (ref >60)
GFR calc non Af Amer: 51 mL/min — ABNORMAL LOW (ref >60)
Glucose, Bld: 87 mg/dL (ref 65–99)
Potassium: 4.2 mmol/L (ref 3.5–5.1)
Sodium: 139 mmol/L (ref 135–145)

## 2016-11-16 LAB — GLUCOSE, CAPILLARY
Glucose-Capillary: 146 mg/dL — ABNORMAL HIGH (ref 65–99)
Glucose-Capillary: 188 mg/dL — ABNORMAL HIGH (ref 65–99)
Glucose-Capillary: 90 mg/dL (ref 65–99)

## 2016-11-16 LAB — MAGNESIUM: Magnesium: 2.1 mg/dL (ref 1.7–2.4)

## 2016-11-16 MED ORDER — CEFUROXIME AXETIL 500 MG PO TABS: 500.0000 mg | ORAL_TABLET | Freq: Two times a day (BID) | ORAL | 0 refills | Status: DC

## 2016-11-16 MED ORDER — AMLODIPINE BESYLATE 5 MG PO TABS: 5.0000 mg | ORAL_TABLET | Freq: Every day | ORAL | 0 refills | Status: DC

## 2016-11-16 MED ORDER — CEFUROXIME AXETIL 500 MG PO TABS
500.0000 mg | ORAL_TABLET | Freq: Two times a day (BID) | ORAL | Status: DC
  Administered 2016-11-16: 500 mg via ORAL
  Filled 2016-11-16: qty 1

## 2016-11-16 NOTE — Progress Notes (Signed)
Discussed and explained discharge instructions to patient and daughter. Prescriptions given. Follow up appt. needs to be scheduled by patient has it is Sunday and office closed. Pt going home with daughter via w/c with belongings.

## 2016-11-16 NOTE — Discharge Summary (Signed)
Physician Discharge Summary  Tempie Gibeault TIW:[id number] DOB: [birth year] DOA: 11/14/2016  PCP: Lilian Coma, MD  Admit date: 11/14/2016 Discharge date: 11/16/2016  Time spent: 35 minutes  Recommendations for Outpatient Follow-up:  DKA/DM type II controlled with complication -25/05 Hemoglobin A1c= 7.0 -Resolved -Resume home regimen. -Follow up with Dr. Jonathon Jordan in 1-2 weeks for DKA, acute renal failure, acute encephalopathy.    Acute Encephalopathy -Multifactorial, DKA, acute renal failure, UTI. -Resolved  Seizure --Continue keppra -No evidence of seizure activity prior to admission or during this admission  Acute renal failure (baseline Cr ~1)/UTI  --Multifactorial infection, DKA, dehydration. -Hold diuretics, ARB --Hydrate with NS her glucose stabilizer protocol -Complete 5 day course current antibiotic -Renal ultrasound: Normal  HTN -Amlodipine 5 mg daily -Toprol 50 mg daily  HLD  -Lipitor 40 mg daily  Hypokalemia  -Resolved     Discharge Diagnoses:  Principal Problem:   DKA (diabetic ketoacidoses) ([place]) Active Problems:   Acute encephalopathy   Seizures (HCC)   CKD (chronic kidney disease) stage 3, GFR 30-59 ml/min   Benign essential HTN   Dyslipidemia associated with type 2 diabetes mellitus (HCC)   UTI (urinary tract infection)   Acute cystitis without hematuria   Acute renal failure ([place])   Discharge Condition: Stable  Diet recommendation: American diabetic Association  Filed Weights   11/14/16 2124  Weight: 70.3 kg (155 lb)    History of present illness:  60 y.o.BF PMHx HTN, DM Type 2 with complication, HTN, Dyslipidemia, and seizure disorder (has just seen her neurologist in clinic, on Keppra 750mg  BID), Mood disorder   Who presents to the ED accompanied by her daughter for evaluation of one week of progressive weakness, fatigue, decreased PO intake, and intermittent confusion. She has had nausea but no  vomiting. She had a syncopal episode prior to presentation. No chest pain or shortness of breath. No fevers, chills, or sweats. She has had right sided abdominal and flank pain, but she denies lower urinary tract symptoms. She is also developing a sore throat and cough.  ED Course:She has an abnormal U/A consistent with UTI. She also has evidence of DKA with blood sugar 928, bicarb 20. Sodium 120. She has AKI with elevated BUN of 36 and creatinine of 1.72 (up from baseline of 14 and 1.18, respectively). She has a normal pH on VBG. Normal lactic acid. Elevated beta hydroxybutyric acid level to 2.39. She has received 2L of NS. She is now on a maintenance rate of 125cc/hr. She is on an insulin infusion per protocol. She has received empiric IV Rocephin. Hospitalist asked to admit. Patient was treated with glucose stabilizer protocol for DKA most likely secondary to UTI. Unfortunately urine not collected upon admission therefore organism not identified. However patient has responded to glucose stabilizer protocol and ceftriaxone. Stable for discharge   Procedures: 12/30 Renal Ultrasound: Normal   Consultations: NA  Antibiotics Anti-infectives    Start     Stop   11/16/16 0800  cefUROXime (CEFTIN) tablet 500 mg         11/15/16 2200  cefTRIAXone (ROCEPHIN) 1 g in dextrose 5 % 50 mL IVPB  Status:  Discontinued     11/16/16 0554   11/14/16 2245  cefTRIAXone (ROCEPHIN) 1 g in dextrose 5 % 50 mL IVPB     11/14/16 2340       Discharge Exam: Vitals:   11/15/16 1929 11/15/16 2344 11/16/16 0348 11/16/16 0718  BP: 131/85 123/88 (!) 95/58 105/74  Pulse: (!) 104 Marland Kitchen)  115 85 80  Resp: 10 (!) 25 12 13   Temp: 98.5 F (36.9 C) 98.8 F (37.1 C) 98 F (36.7 C) 98.5 F (36.9 C)  TempSrc: Oral Oral Oral Oral  SpO2: 99% 100% 99% 100%  Weight:      Height:        General: A/O 4, NAD, No acute respiratory distress Eyes: negative scleral hemorrhage, negative anisocoria, negative  icterus ENT: Negative Runny nose, negative gingival bleeding, Neck:  Negative scars, masses, torticollis, lymphadenopathy, JVD Lungs: Clear to auscultation bilaterally without wheezes or crackles Cardiovascular: Regular rate and rhythm without murmur gallop or rub normal S1 and S2 Abdomen: Positive suprapubic abdominal pain, positive right CVA tenderness, nondistended, positive soft, bowel sounds, no rebound, no ascites, no appreciable mass   Discharge Instructions   Allergies as of 11/16/2016      Reactions   Percocet [oxycodone-acetaminophen] Nausea And Vomiting, Other (See Comments)   Shaky/unsteady.   Vicodin [hydrocodone-acetaminophen] Nausea And Vomiting, Other (See Comments)   Shaky/unsteady.      Medication List    STOP taking these medications   AZOR 5-40 MG tablet Generic drug:  amLODipine-olmesartan   hydrochlorothiazide 25 MG tablet Commonly known as:  HYDRODIURIL   lidocaine 5 % Commonly known as:  LIDODERM   sucralfate 1 g tablet Commonly known as:  CARAFATE     TAKE these medications   ACCU-CHEK AVIVA device Use as instructed   amitriptyline 50 MG tablet Commonly known as:  ELAVIL Take 50 mg by mouth at bedtime.   amLODipine 5 MG tablet Commonly known as:  NORVASC Take 1 tablet (5 mg total) by mouth daily. Start taking on:  11/17/2016   atorvastatin 40 MG tablet Commonly known as:  LIPITOR Take 40 mg by mouth daily.   cefUROXime 500 MG tablet Commonly known as:  CEFTIN Take 1 tablet (500 mg total) by mouth 2 (two) times daily with a meal.   gabapentin 600 MG tablet Commonly known as:  NEURONTIN Take 600 mg by mouth 2 (two) times daily.   glucose blood test strip Commonly known as:  ACCU-CHEK AVIVA 1 each by Other route 2 (two) times daily. And lancets 2/day   Insulin Glargine 100 UNIT/ML Solostar Pen Commonly known as:  LANTUS SOLOSTAR Inject 18 Units into the skin every morning.   levETIRAcetam 750 MG tablet Commonly known as:   KEPPRA Take 1 tablet (750 mg total) by mouth 2 (two) times daily.   metoprolol succinate 25 MG 24 hr tablet Commonly known as:  TOPROL-XL Take 50 mg by mouth at bedtime.      Allergies  Allergen Reactions  . Percocet [Oxycodone-Acetaminophen] Nausea And Vomiting and Other (See Comments)    Shaky/unsteady.  . Vicodin [Hydrocodone-Acetaminophen] Nausea And Vomiting and Other (See Comments)    Shaky/unsteady.      The results of significant diagnostics from this hospitalization (including imaging, microbiology, ancillary and laboratory) are listed below for reference.    Significant Diagnostic Studies: US Renal  Result Date: 11/15/2016 CLINICAL DATA:  61 year old female with right flank pain for 1 week. Initial encounter. EXAM: RENAL / URINARY TRACT ULTRASOUND COMPLETE COMPARISON:   Imaging lumbar MRI 11/22/2015. CT Abdomen and Pelvis 10/17/2014 FINDINGS: Right Kidney: Length: 9.6 cm. Echogenicity within normal limits. No mass or hydronephrosis visualized. Left Kidney: Length: 9.6 cm. Echogenicity within normal limits. No mass or hydronephrosis visualized. Bladder: Not visualized, apparently decompressed. IMPRESSION: Normal sonographic appearance of both kidneys. Urinary bladder decompressed and not visualized. Electronically Signed  By: Genevie Ann M.D.   On: 11/15/2016 09:07   Dg Chest Port 1 View  Result Date: 11/14/2016 CLINICAL DATA:  Chest pain EXAM: PORTABLE CHEST 1 VIEW COMPARISON:  CT 05/26/2016, CXR 05/26/2016 FINDINGS: The heart size and mediastinal contours are within normal limits. Mild aortic atherosclerosis. Both lungs are clear. The visualized skeletal structures are unremarkable. IMPRESSION: No active disease.  Aortic atherosclerosis. Electronically Signed   By: Ashley Royalty M.D.   On: 11/14/2016 22:00    Microbiology: Recent Results (from the past 240 hour(s))  MRSA PCR Screening     Status: None   Collection Time: 11/15/16  3:45 AM  Result Value Ref Range  Status   MRSA by PCR NEGATIVE NEGATIVE Final    Comment:        The GeneXpert MRSA Assay (FDA approved for NASAL specimens only), is one component of a comprehensive MRSA colonization surveillance program. It is not intended to diagnose MRSA infection nor to guide or monitor treatment for MRSA infections.      Labs: Basic Metabolic Panel:  Recent Labs Lab 11/14/16 1923 11/15/16 0202 11/15/16 0515 11/15/16 0917 11/16/16 0354  NA 120* 133* 137 134* 139  K 4.9 3.4* 3.2* 3.4* 4.2  CL 83* 100* 104 103 109  CO2 20* 24 25 24 27   GLUCOSE 928* 295* 133* 254* 87  BUN 36* 30* 27* 24* 15  CREATININE 1.72* 1.42* 1.28* 1.18* 1.14*  CALCIUM 9.5 8.9 9.0 8.9 9.4  MG  --   --   --   --  2.1   Liver Function Tests: No results for input(s): AST, ALT, ALKPHOS, BILITOT, PROT, ALBUMIN in the last 168 hours. No results for input(s): LIPASE, AMYLASE in the last 168 hours. No results for input(s): AMMONIA in the last 168 hours. CBC:  Recent Labs Lab 11/14/16 1923  WBC 5.2  HGB 11.1*  HCT 32.5*  MCV 84.6  PLT 306   Cardiac Enzymes: No results for input(s): CKTOTAL, CKMB, CKMBINDEX, TROPONINI in the last 168 hours. BNP: BNP (last 3 results) No results for input(s): BNP in the last 8760 hours.  ProBNP (last 3 results) No results for input(s): PROBNP in the last 8760 hours.  CBG:  Recent Labs Lab 11/15/16 1702 11/15/16 1947 11/15/16 2339 11/16/16 0347 11/16/16 0820  GLUCAP 347* 309* 146* 90 188*       Signed:  Dia Crawford, MD [facility] [phone] pager

## 2016-11-17 LAB — URINE CULTURE: Culture: >=100000 — AB

## 2016-12-10 ENCOUNTER — Ambulatory Visit: Payer: Medicare Other | Admitting: Endocrinology

## 2016-12-24 ENCOUNTER — Ambulatory Visit (INDEPENDENT_AMBULATORY_CARE_PROVIDER_SITE_OTHER): Payer: Medicare Other | Admitting: Endocrinology

## 2016-12-24 ENCOUNTER — Encounter: Payer: Self-pay | Admitting: Endocrinology

## 2016-12-24 VITALS — BP 110/82 | HR 87 | Ht 67.0 in | Wt 158.0 lb

## 2016-12-24 DIAGNOSIS — E1042 Type 1 diabetes mellitus with diabetic polyneuropathy: Secondary | ICD-10-CM

## 2016-12-24 LAB — BASIC METABOLIC PANEL
BUN: 22 mg/dL (ref 6–23)
CO2: 29 mEq/L (ref 19–32)
Calcium: 9.6 mg/dL (ref 8.4–10.5)
Chloride: 94 mEq/L — ABNORMAL LOW (ref 96–112)
Creatinine, Ser: 1.25 mg/dL — ABNORMAL HIGH (ref 0.40–1.20)
GFR: 56.09 mL/min — ABNORMAL LOW (ref >60.00)
Glucose, Bld: 675 mg/dL (ref 70–99)
Potassium: 4.3 mEq/L (ref 3.5–5.1)
Sodium: 128 mEq/L — ABNORMAL LOW (ref 135–145)

## 2016-12-24 LAB — POCT GLYCOSYLATED HEMOGLOBIN (HGB A1C): Hemoglobin A1C: 12.1

## 2016-12-24 MED ORDER — INSULIN GLARGINE 100 UNIT/ML SOLOSTAR PEN: 30.0000 [IU] | PEN_INJECTOR | SUBCUTANEOUS | Status: DC

## 2016-12-24 NOTE — Progress Notes (Signed)
Subjective:    Patient ID: Tricia Huynh, female    DOB: Oct 12, 1956, 61 y.o.   MRN: [id number]  HPI Pt returns for f/u of diabetes mellitus: DM type: 1 Dx'ed: 9767 Complications: polyneuropathy Therapy: insulin since 2010.   GDM: never DKA: once, in late 2017 Severe hypoglycemia: once, in 2017.  Pancreatitis: never.  Other: She takes QD insulin, due to noncompliance; insurance declined V-GO.  Interval history: she was in hosp 6 weeks ago, for mild DKA.  no cbg record, but states cbg's are well-controlled.  pt states she feels well in general.  Pt says she now never misses the insulin.  She says she takes 30 units qam.  She seldom has hypoglycemia, and these episodes ar mild.   Past Medical History:  Diagnosis Date  . Diabetes mellitus type 2 in nonobese ([place]) 06/06/2015  . Diabetes mellitus without complication ([place])   . Diabetic neuropathy ([place]) 06/06/2015  . Dyslipidemia 06/06/2015  . Encephalopathy   . Essential hypertension 06/06/2015  . Hypertension   . Mood disorder ([place]) 06/06/2015  . Seizure [facility])    sees [name]. abd eeg, on keppra    Past Surgical History:  Procedure Laterality Date  . ABDOMINAL HYSTERECTOMY    . EYE SURGERY Bilateral   . TUBAL LIGATION      Social History   Social History  . Marital status: Single    Spouse name: N/A  . Number of children: 3  . Years of education: 10   Occupational History  . Disabled    Social History Main Topics  . Smoking status: Former Research scientist (life sciences)  . Smokeless tobacco: Never Used  . Alcohol use No  . Drug use: No  . Sexual activity: Yes   Other Topics Concern  . Not on file   Social History Narrative   Lives at home with her grandchildren.   Right-handed.   No caffeine use.    Current Outpatient Prescriptions on File Prior to Visit  Medication Sig Dispense Refill  . amitriptyline (ELAVIL) 50 MG tablet Take 50 mg by mouth at bedtime.  1  . amLODipine (NORVASC) 5 MG tablet Take 1 tablet (5 mg total) by  mouth daily. 30 tablet 0  . atorvastatin (LIPITOR) 40 MG tablet Take 40 mg by mouth daily.  1  . Blood Glucose Monitoring Suppl (ACCU-CHEK AVIVA) device Use as instructed 1 each 0  . gabapentin (NEURONTIN) 600 MG tablet Take 600 mg by mouth 2 (two) times daily.   0  . glucose blood (ACCU-CHEK AVIVA) test strip 1 each by Other route 2 (two) times daily. And lancets 2/day 100 each 12  . levETIRAcetam (KEPPRA) 750 MG tablet Take 1 tablet (750 mg total) by mouth 2 (two) times daily. 60 tablet 6  . metoprolol succinate (TOPROL-XL) 25 MG 24 hr tablet Take 50 mg by mouth at bedtime.  1   No current facility-administered medications on file prior to visit.     Allergies  Allergen Reactions  . Percocet [Oxycodone-Acetaminophen] Nausea And Vomiting and Other (See Comments)    Shaky/unsteady.  . Vicodin [Hydrocodone-Acetaminophen] Nausea And Vomiting and Other (See Comments)    Shaky/unsteady.    Family History  Problem Relation Age of Onset  . Heart disease Father   . Kidney disease Father   . Diabetes Mother   . Seizures Sister   . Seizures Brother     BP 110/82   Pulse 87   Ht 5\' 7"  (1.702 m)   Wt 158  lb (71.7 kg)   SpO2 96%   BMI 24.75 kg/m   Review of Systems She denies LOC    Objective:   Physical Exam VITAL SIGNS:  See vs page GENERAL: no distress Pulses: dorsalis pedis intact bilat.   MSK: no deformity of the feet CV: no leg edema Skin:  no ulcer on the feet.  normal color and temp on the feet. Neuro: sensation is intact to touch on the feet.    A1c=12.1% Glucose=675    Assessment & Plan:  Type 1 DM, with polyneuropathy: worse.  DKA: new.  This, along with severe hyperglycemia on an increased insulin dosage,  is evidence of noncompliance with insulin.   Patient is advised the following: Patient Instructions  check your blood sugar twice a day.  vary the time of day when you check, between before the 3 meals, and at bedtime.  also check if you have symptoms of  your blood sugar being too high or too low.  please keep a record of the readings and bring it to your next appointment here (or you can bring the meter itself).  You can write it on any piece of paper.  please call us sooner if your blood sugar goes below 70, or if you have a lot of readings over 200.   Please continue the same insulin.   blood tests are requested for you today.  We'll let you know about the results. On this type of insulin schedule, you should eat meals on a regular schedule.  If a meal is missed or significantly delayed, your blood sugar could go low.  Please come back for a follow-up appointment in 3 months.

## 2016-12-24 NOTE — Patient Instructions (Addendum)
check your blood sugar twice a day.  vary the time of day when you check, between before the 3 meals, and at bedtime.  also check if you have symptoms of your blood sugar being too high or too low.  please keep a record of the readings and bring it to your next appointment here (or you can bring the meter itself).  You can write it on any piece of paper.  please call us sooner if your blood sugar goes below 70, or if you have a lot of readings over 200.   Please continue the same insulin.   blood tests are requested for you today.  We'll let you know about the results. On this type of insulin schedule, you should eat meals on a regular schedule.  If a meal is missed or significantly delayed, your blood sugar could go low.  Please come back for a follow-up appointment in 3 months.

## 2016-12-25 ENCOUNTER — Telehealth: Payer: Self-pay | Admitting: Endocrinology

## 2016-12-25 NOTE — Telephone Encounter (Signed)
Pt needs her Accu Check Aviva test strips sent into the [facility] on [address]. She is testing 2x daily.

## 2016-12-26 LAB — FRUCTOSAMINE: Fructosamine: 496 umol/L — ABNORMAL HIGH (ref 190–270)

## 2016-12-26 MED ORDER — GLUCOSE BLOOD VI STRP: 1.0000 | ORAL_STRIP | Freq: Two times a day (BID) | 12 refills | Status: DC

## 2016-12-26 NOTE — Telephone Encounter (Signed)
Refill submitted. 

## 2016-12-30 ENCOUNTER — Emergency Department (HOSPITAL_COMMUNITY): Payer: Medicare Other

## 2016-12-30 ENCOUNTER — Observation Stay (HOSPITAL_COMMUNITY)
Admission: EM | Admit: 2016-12-30 | Discharge: 2016-12-31 | Disposition: A | Discharge status: Home / Self Care | Payer: Medicare Other | Attending: Internal Medicine | Admitting: Internal Medicine

## 2016-12-30 ENCOUNTER — Encounter (HOSPITAL_COMMUNITY): Payer: Self-pay | Admitting: Emergency Medicine

## 2016-12-30 DIAGNOSIS — R072 Precordial pain: Secondary | ICD-10-CM | POA: Diagnosis present

## 2016-12-30 DIAGNOSIS — Z79899 Other long term (current) drug therapy: Secondary | ICD-10-CM | POA: Diagnosis not present

## 2016-12-30 DIAGNOSIS — N183 Chronic kidney disease, stage 3 unspecified: Secondary | ICD-10-CM | POA: Diagnosis present

## 2016-12-30 DIAGNOSIS — R079 Chest pain, unspecified: Secondary | ICD-10-CM | POA: Diagnosis present

## 2016-12-30 DIAGNOSIS — I129 Hypertensive chronic kidney disease with stage 1 through stage 4 chronic kidney disease, or unspecified chronic kidney disease: Secondary | ICD-10-CM | POA: Diagnosis not present

## 2016-12-30 DIAGNOSIS — I10 Essential (primary) hypertension: Secondary | ICD-10-CM | POA: Diagnosis not present

## 2016-12-30 DIAGNOSIS — E1165 Type 2 diabetes mellitus with hyperglycemia: Secondary | ICD-10-CM

## 2016-12-30 DIAGNOSIS — I208 Other forms of angina pectoris: Secondary | ICD-10-CM

## 2016-12-30 DIAGNOSIS — G40909 Epilepsy, unspecified, not intractable, without status epilepticus: Secondary | ICD-10-CM | POA: Diagnosis not present

## 2016-12-30 DIAGNOSIS — E1140 Type 2 diabetes mellitus with diabetic neuropathy, unspecified: Secondary | ICD-10-CM | POA: Diagnosis not present

## 2016-12-30 DIAGNOSIS — E1110 Type 2 diabetes mellitus with ketoacidosis without coma: Secondary | ICD-10-CM | POA: Diagnosis not present

## 2016-12-30 DIAGNOSIS — E119 Type 2 diabetes mellitus without complications: Secondary | ICD-10-CM

## 2016-12-30 DIAGNOSIS — Z794 Long term (current) use of insulin: Secondary | ICD-10-CM | POA: Diagnosis not present

## 2016-12-30 LAB — CBC WITH DIFFERENTIAL/PLATELET
Basophils Absolute: 0 10*3/uL (ref 0.0–0.1)
Basophils Relative: 0 %
Eosinophils Absolute: 0 10*3/uL (ref 0.0–0.7)
Eosinophils Relative: 0 %
HCT: 30.2 % — ABNORMAL LOW (ref 36.0–46.0)
Hemoglobin: 9.9 g/dL — ABNORMAL LOW (ref 12.0–15.0)
Lymphocytes Relative: 39 %
Lymphs Abs: 2.4 10*3/uL (ref 0.7–4.0)
MCH: 28.1 pg (ref 26.0–34.0)
MCHC: 32.8 g/dL (ref 30.0–36.0)
MCV: 85.8 fL (ref 78.0–100.0)
Monocytes Absolute: 0.3 10*3/uL (ref 0.1–1.0)
Monocytes Relative: 5 %
Neutro Abs: 3.4 10*3/uL (ref 1.7–7.7)
Neutrophils Relative %: 56 %
Platelets: 271 10*3/uL (ref 150–400)
RBC: 3.52 MIL/uL — ABNORMAL LOW (ref 3.87–5.11)
RDW: 13.1 % (ref 11.5–15.5)
WBC: 6 10*3/uL (ref 4.0–10.5)

## 2016-12-30 LAB — COMPREHENSIVE METABOLIC PANEL
ALT: 16 U/L (ref 14–54)
AST: 21 U/L (ref 15–41)
Albumin: 3.9 g/dL (ref 3.5–5.0)
Alkaline Phosphatase: 177 U/L — ABNORMAL HIGH (ref 38–126)
Anion gap: 10 (ref 5–15)
BUN: 27 mg/dL — ABNORMAL HIGH (ref 6–20)
CO2: 25 mmol/L (ref 22–32)
Calcium: 9.8 mg/dL (ref 8.9–10.3)
Chloride: 104 mmol/L (ref 101–111)
Creatinine, Ser: 1.64 mg/dL — ABNORMAL HIGH (ref 0.44–1.00)
GFR calc Af Amer: 38 mL/min — ABNORMAL LOW (ref >60)
GFR calc non Af Amer: 33 mL/min — ABNORMAL LOW (ref >60)
Glucose, Bld: 295 mg/dL — ABNORMAL HIGH (ref 65–99)
Potassium: 3.8 mmol/L (ref 3.5–5.1)
Sodium: 139 mmol/L (ref 135–145)
Total Bilirubin: 0.6 mg/dL (ref 0.3–1.2)
Total Protein: 8.3 g/dL — ABNORMAL HIGH (ref 6.5–8.1)

## 2016-12-30 LAB — I-STAT TROPONIN, ED: Troponin i, poc: 0 ng/mL (ref 0.00–0.08)

## 2016-12-30 LAB — D-DIMER, QUANTITATIVE: D-Dimer, Quant: 0.4 ug/mL-FEU (ref 0.00–0.50)

## 2016-12-30 LAB — GLUCOSE, CAPILLARY: Glucose-Capillary: 71 mg/dL (ref 65–99)

## 2016-12-30 MED ORDER — ATORVASTATIN CALCIUM 40 MG PO TABS
40.0000 mg | ORAL_TABLET | Freq: Every day | ORAL | Status: DC
  Administered 2016-12-31: 40 mg via ORAL
  Filled 2016-12-30: qty 1

## 2016-12-30 MED ORDER — SODIUM CHLORIDE 0.9 % IV SOLN
INTRAVENOUS | Status: DC
  Administered 2016-12-30: 23:00:00 via INTRAVENOUS

## 2016-12-30 MED ORDER — METOPROLOL SUCCINATE ER 50 MG PO TB24
50.0000 mg | ORAL_TABLET | Freq: Every day | ORAL | Status: DC
  Administered 2016-12-30: 50 mg via ORAL
  Filled 2016-12-30: qty 1

## 2016-12-30 MED ORDER — ONDANSETRON HCL 4 MG/2ML IJ SOLN: 4.0000 mg | Freq: Four times a day (QID) | INTRAMUSCULAR | Status: DC | PRN

## 2016-12-30 MED ORDER — HYDRALAZINE HCL 20 MG/ML IJ SOLN: 10.0000 mg | INTRAMUSCULAR | Status: DC | PRN

## 2016-12-30 MED ORDER — GABAPENTIN 600 MG PO TABS
600.0000 mg | ORAL_TABLET | Freq: Two times a day (BID) | ORAL | Status: DC
  Administered 2016-12-30 – 2016-12-31 (×2): 600 mg via ORAL
  Filled 2016-12-30 (×2): qty 1

## 2016-12-30 MED ORDER — ACETAMINOPHEN 650 MG RE SUPP: 650.0000 mg | Freq: Four times a day (QID) | RECTAL | Status: DC | PRN

## 2016-12-30 MED ORDER — ONDANSETRON HCL 4 MG PO TABS: 4.0000 mg | ORAL_TABLET | Freq: Four times a day (QID) | ORAL | Status: DC | PRN

## 2016-12-30 MED ORDER — LEVETIRACETAM 750 MG PO TABS
750.0000 mg | ORAL_TABLET | Freq: Two times a day (BID) | ORAL | Status: DC
  Administered 2016-12-30 – 2016-12-31 (×2): 750 mg via ORAL
  Filled 2016-12-30 (×2): qty 1

## 2016-12-30 MED ORDER — POLYETHYLENE GLYCOL 3350 17 G PO PACK: 17.0000 g | PACK | Freq: Every day | ORAL | Status: DC | PRN

## 2016-12-30 MED ORDER — INSULIN GLARGINE 100 UNIT/ML ~~LOC~~ SOLN
30.0000 [IU] | Freq: Every day | SUBCUTANEOUS | Status: DC
  Administered 2016-12-31: 30 [IU] via SUBCUTANEOUS
  Filled 2016-12-30: qty 0.3

## 2016-12-30 MED ORDER — SODIUM CHLORIDE 0.9 % IV BOLUS (SEPSIS)
1000.0000 mL | Freq: Once | INTRAVENOUS | Status: AC
  Administered 2016-12-30: 1000 mL via INTRAVENOUS

## 2016-12-30 MED ORDER — AMITRIPTYLINE HCL 25 MG PO TABS
50.0000 mg | ORAL_TABLET | Freq: Every day | ORAL | Status: DC
  Administered 2016-12-30: 50 mg via ORAL
  Filled 2016-12-30: qty 2

## 2016-12-30 MED ORDER — ENOXAPARIN SODIUM 40 MG/0.4ML ~~LOC~~ SOLN
40.0000 mg | SUBCUTANEOUS | Status: DC
  Administered 2016-12-31: 40 mg via SUBCUTANEOUS
  Filled 2016-12-30: qty 0.4

## 2016-12-30 MED ORDER — ACETAMINOPHEN 325 MG PO TABS: 650.0000 mg | ORAL_TABLET | Freq: Four times a day (QID) | ORAL | Status: DC | PRN

## 2016-12-30 MED ORDER — AMLODIPINE BESYLATE 5 MG PO TABS
5.0000 mg | ORAL_TABLET | Freq: Two times a day (BID) | ORAL | Status: DC
  Administered 2016-12-30 – 2016-12-31 (×2): 5 mg via ORAL
  Filled 2016-12-30 (×2): qty 1

## 2016-12-30 MED ORDER — MORPHINE SULFATE (PF) 2 MG/ML IV SOLN: 2.0000 mg | INTRAVENOUS | Status: DC | PRN

## 2016-12-30 NOTE — H&P (Signed)
[facility] History and Physical  Tricia Huynh KDT:[id number] DOB: 1956-01-24 DOA: 12/30/2016  Referring physician: Dr Vanita Panda PCP: Lilian Coma, MD   Chief Complaint: CHest pain  HPI: Tricia Huynh is a 61 y.o. female with history of HTN, HL, diabetes type 2 with neuropathy / retinopathy presenting with 3 day hx of chest pain.  Pain in sharp, substernal, brought on my moving around, cleaning, resolves with rest after about 15 minutes. No radiation, +SOB associated, no palpitations or diaphoresis. No hx CAD. Asked to see for admission. In ED EKG w/o ischemic changes , trop negative.    DM since 2005, +laser Rx, + nerve damage in legs.  No hx MI. +HTN.  No PSH.    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  Single , has 3 grown children, lives with her grandchildren and great grandchildren.  Doesn't drive, no tob/ etoh.  Out of work since 1998 , on disability due to DM.       ROS  no HA  no blurry vision  no rash  no diarrhea  no nausea/ vomiting  no dysuria  no difficulty voiding  no change in urine color    Past Medical History  Past Medical History:  Diagnosis Date  . Diabetes mellitus type 2 in nonobese ([place]) 06/06/2015  . Diabetes mellitus without complication ([place])   . Diabetic neuropathy ([place]) 06/06/2015  . Dyslipidemia 06/06/2015  . Encephalopathy   . Essential hypertension 06/06/2015  . Hypertension   . Mood disorder ([place]) 06/06/2015  . Seizure [facility])    sees [name]. abd eeg, on keppra   Past Surgical History  Past Surgical History:  Procedure Laterality Date  . ABDOMINAL HYSTERECTOMY    . EYE SURGERY Bilateral   . TUBAL LIGATION     Family History  Family History  Problem Relation Age of Onset  . Heart disease Father   . Kidney disease Father   . Diabetes Mother   . Seizures Sister   . Seizures Brother    Social History  reports that she has quit smoking. She has never used smokeless tobacco. She reports  that she does not drink alcohol or use drugs. Allergies  Allergies  Allergen Reactions  . Percocet [Oxycodone-Acetaminophen] Nausea And Vomiting and Other (See Comments)    Shaky/unsteady.  . Vicodin [Hydrocodone-Acetaminophen] Nausea And Vomiting and Other (See Comments)    Shaky/unsteady.   Home medications Prior to Admission medications   Medication Sig Start Date End Date Taking? Authorizing Provider  amitriptyline (ELAVIL) 50 MG tablet Take 50 mg by mouth at bedtime. 05/08/15  Yes Historical Provider, MD  amLODipine-olmesartan (AZOR) 5-40 MG tablet Take 1 tablet by mouth daily. 10/08/16  Yes Historical Provider, MD  atorvastatin (LIPITOR) 40 MG tablet Take 40 mg by mouth daily. 05/10/15  Yes Historical Provider, MD  gabapentin (NEURONTIN) 600 MG tablet Take 600 mg by mouth 2 (two) times daily.  10/08/14  Yes Historical Provider, MD  hydrochlorothiazide (HYDRODIURIL) 25 MG tablet Take 25 mg by mouth daily. 11/08/16  Yes Historical Provider, MD  Insulin Glargine (LANTUS SOLOSTAR) 100 UNIT/ML Solostar Pen Inject 30 Units into the skin every morning. 12/24/16  Yes Renato Shin, MD  levETIRAcetam (KEPPRA) 750 MG tablet Take 1 tablet (750 mg total) by mouth 2 (two) times daily. 11/12/16  Yes Dennie Bible, NP  metoprolol succinate (TOPROL-XL) 25 MG 24 hr tablet Take 50 mg by mouth at bedtime. 05/08/15  Yes Historical  Provider, MD   Liver Function Tests  Recent Labs Lab 12/30/16 1815  AST 21  ALT 16  ALKPHOS 177*  BILITOT 0.6  PROT 8.3*  ALBUMIN 3.9   No results for input(s): LIPASE, AMYLASE in the last 168 hours. CBC  Recent Labs Lab 12/30/16 1815  WBC 6.0  NEUTROABS 3.4  HGB 9.9*  HCT 30.2*  MCV 85.8  PLT 614   Basic Metabolic Panel  Recent Labs Lab 12/24/16 0905 12/30/16 1815  NA 128* 139  K 4.3 3.8  CL 94* 104  CO2 29 25  GLUCOSE 675* 295*  BUN 22 27*  CREATININE 1.25* 1.64*  CALCIUM 9.6 9.8     Vitals:   12/30/16 1819 12/30/16 1852 12/30/16 1900  12/30/16 1927  BP:  (!) 157/109 142/98   Pulse:  113 106   Resp:  22 18   Temp:      TempSrc:      SpO2:  97% 97% 98%  Weight: 71.7 kg (158 lb)     Height: 5\' 7"  (1.702 m)      Exam: Gen alert, no distress, calm No rash, cyanosis or gangrene Sclera anicteric, throat clear  No jvd or bruits  Chest clear bilat RRR no MRG Abd soft ntnd no mass or ascites +bs GU defer MS no joint effusions or deformity Ext no LE edema / no wounds or ulcers Neuro is alert, Ox 3 , nf   Trop 0.00, EKG sinus tach, LAFB, some NS ST-T changes Hb 9.9  WBC 6k   BUN 27 Cr 1.64 (baseline 1.2)  Glu 295 CXR - atherosclerosis. No active lung disease    Assessment: 1. Chest pain - new onset less than 1 week, +sig RF's w advanced DM/ CKD, + typical anginal symptoms. Plan admit, serial trop, consult cardiology in am.   2. IDDM 3. HTN  4. Seizure d/o - on Keppra 5. CKD 3 - creat up, prob dry. Hold ARB / diuretic in case needs cath  Plan - as above     [place] D [facility] Pager [phone]   If 7PM-7AM, please contact night-coverage [email or web address] Password [facility] 12/30/2016, 9:58 PM

## 2016-12-30 NOTE — ED Triage Notes (Signed)
Pt c/o mid sharp chest pain off and on since yesterday.  Also st's she feel lightheaded.  Pt denies any nausea or vomiting.

## 2016-12-30 NOTE — ED Notes (Signed)
Patient transported to X-ray 

## 2016-12-30 NOTE — ED Notes (Signed)
Attempted report x1. 

## 2016-12-30 NOTE — ED Provider Notes (Signed)
[place] DEPT Provider Note   CSN: [id number] Arrival date & time: 12/30/16  1752     History   Chief Complaint Chief Complaint  Patient presents with  . Chest Pain    HPI Tricia Huynh is a 61 y.o. female.  HPI  61 y.o. female with a hx of DM2, HTN, presents to the Emergency Department today complaining of sternal chest pain on and off since yesterday. Notes feeling lightheaded as well. Associated shortness of breath that is worse with exertion. CP/SOB seem to be relieved with rest. No active CP currently. No N/V. No diaphoresis. States that when pain occurs it lasts 10-15 minutes and then subsides. No recent travel. No recent surgeries. No estrogen use. No hx DVT/PE. No hx ACS. No FH. No other symptoms noted. .     Past Medical History:  Diagnosis Date  . Diabetes mellitus type 2 in nonobese ([place]) 06/06/2015  . Diabetes mellitus without complication ([place])   . Diabetic neuropathy ([place]) 06/06/2015  . Dyslipidemia 06/06/2015  . Encephalopathy   . Essential hypertension 06/06/2015  . Hypertension   . Mood disorder ([place]) 06/06/2015  . Seizure [facility])    sees [name]. abd eeg, on keppra    Patient Active Problem List   Diagnosis Date Noted  . Diabetic ketoacidosis without coma associated with type 2 diabetes mellitus ([place])   . DKA (diabetic ketoacidoses) ([place]) 11/15/2016  . UTI (urinary tract infection) 11/15/2016  . Acute cystitis without hematuria   . Acute renal failure ([place])   . Hyperglycemia 11/14/2016  . Diabetes ([place]) 06/05/2016  . Altered mental status   . CKD (chronic kidney disease) stage 3, GFR 30-59 ml/min 05/26/2016  . Benign essential HTN 05/26/2016  . Seizures ([place]) 01/08/2016  . Memory loss 10/04/2015  . Acute encephalopathy 06/06/2015  . Diabetic neuropathy ([place]) 06/06/2015    Past Surgical History:  Procedure Laterality Date  . ABDOMINAL HYSTERECTOMY    . EYE SURGERY Bilateral   . TUBAL LIGATION      OB History    No data available         Home Medications    Prior to Admission medications   Medication Sig Start Date End Date Taking? Authorizing Provider  amitriptyline (ELAVIL) 50 MG tablet Take 50 mg by mouth at bedtime. 05/08/15   Historical Provider, MD  amLODipine (NORVASC) 5 MG tablet Take 1 tablet (5 mg total) by mouth daily. 11/17/16   Allie Bossier, MD  atorvastatin (LIPITOR) 40 MG tablet Take 40 mg by mouth daily. 05/10/15   Historical Provider, MD  Blood Glucose Monitoring Suppl (ACCU-CHEK AVIVA) device Use as instructed 01/08/16 01/07/17  Renato Shin, MD  gabapentin (NEURONTIN) 600 MG tablet Take 600 mg by mouth 2 (two) times daily.  10/08/14   Historical Provider, MD  glucose blood (ACCU-CHEK AVIVA) test strip 1 each by Other route 2 (two) times daily. And lancets 2/day 12/26/16   Renato Shin, MD  Insulin Glargine (LANTUS SOLOSTAR) 100 UNIT/ML Solostar Pen Inject 30 Units into the skin every morning. 12/24/16   Renato Shin, MD  levETIRAcetam (KEPPRA) 750 MG tablet Take 1 tablet (750 mg total) by mouth 2 (two) times daily. 11/12/16   Dennie Bible, NP  metoprolol succinate (TOPROL-XL) 25 MG 24 hr tablet Take 50 mg by mouth at bedtime. 05/08/15   Historical Provider, MD    Family History Family History  Problem Relation Age of Onset  . Heart disease Father   . Kidney disease Father   .  Diabetes Mother   . Seizures Sister   . Seizures Brother     Social History Social History  Substance Use Topics  . Smoking status: Former Research scientist (life sciences)  . Smokeless tobacco: Never Used  . Alcohol use No     Allergies   Percocet [oxycodone-acetaminophen] and Vicodin [hydrocodone-acetaminophen]   Review of Systems Review of Systems ROS reviewed and all are negative for acute change except as noted in the HPI.  Physical Exam Updated Vital Signs BP 134/98 (BP Location: Right Arm)   Pulse 120   Temp 98.6 F (37 C) (Oral)   Resp 16   Ht 5\' 7"  (1.702 m)   Wt 71.7 kg   SpO2 99%   BMI 24.75 kg/m   Physical Exam   Constitutional: She is oriented to person, place, and time. Vital signs are normal. She appears well-developed and well-nourished.  HENT:  Head: Normocephalic.  Right Ear: Hearing normal.  Left Ear: Hearing normal.  Eyes: Conjunctivae and EOM are normal. Pupils are equal, round, and reactive to light.  Neck: Normal range of motion. Neck supple.  Cardiovascular: Regular rhythm, normal heart sounds and intact distal pulses.  Tachycardia present.   Pulmonary/Chest: Effort normal and breath sounds normal.  Abdominal: Soft. There is no tenderness.  Musculoskeletal: Normal range of motion.  Neurological: She is alert and oriented to person, place, and time.  Skin: Skin is warm and dry.  Psychiatric: She has a normal mood and affect. Her speech is normal and behavior is normal. Thought content normal.  Nursing note and vitals reviewed.  ED Treatments / Results  Labs (all labs ordered are listed, but only abnormal results are displayed) Labs Reviewed  CBC WITH DIFFERENTIAL/PLATELET - Abnormal; Notable for the following:       Result Value   RBC 3.52 (*)    Hemoglobin 9.9 (*)    HCT 30.2 (*)    All other components within normal limits  COMPREHENSIVE METABOLIC PANEL - Abnormal; Notable for the following:    Glucose, Bld 295 (*)    BUN 27 (*)    Creatinine, Ser 1.64 (*)    Total Protein 8.3 (*)    Alkaline Phosphatase 177 (*)    GFR calc non Af Amer 33 (*)    GFR calc Af Amer 38 (*)    All other components within normal limits  D-DIMER, QUANTITATIVE (NOT AT [facility])  Randolm Idol, ED    EKG  EKG Interpretation None       Radiology Dg Chest 2 View  Result Date: 12/30/2016 CLINICAL DATA:  Chest pain EXAM: CHEST  2 VIEW COMPARISON:  November 14, 2016 FINDINGS: There is no edema or consolidation. Heart size and pulmonary vascularity are normal. No adenopathy. There is mild calcification in the aorta. IMPRESSION: Aortic atherosclerosis.  No edema or consolidation. Electronically  Signed   By: Lowella Grip III M.D.   On: 12/30/2016 18:55    Procedures Procedures (including critical care time)  Medications Ordered in ED Medications - No data to display   Initial Impression / Assessment and Plan / ED Course  I have reviewed the triage vital signs and the nursing notes.  Pertinent labs & imaging results that were available during my care of the patient were reviewed by me and considered in my medical decision making (see chart for details).  Final Clinical Impressions(s) / ED Diagnoses  {I have reviewed and evaluated the relevant laboratory values. {I have reviewed and evaluated the relevant imaging studies. {I have  interpreted the relevant EKG. {I have reviewed the relevant previous healthcare records.  {I obtained HPI from historian. {Patient discussed with supervising physician.  ED Course:  Assessment: Pt is a 60yF presents with CP with onset yesterday. Occurs with exertion. No pain currently at rest. No N/V. No diaphoresis. Risk Factors HTN, DM2. Given fluids in ED due to tachycardia. Concern for cardiac etiology of Chest Pain. Medicine has been consulted and will see patient in the ED for likely admit. Pt does not meet criteria for CP protocol and a further evaluation is recommended. Pt has been re-evaluated prior to consult and VSS, NAD, pain 0/10, lungs CTAB. No acute abnormalities found on EKG and first round of cardiac enzymes negative. D dimer was ordered due to tachycardia, which was negative. Plan is to admit to medicine due to Heart Score 4 for rule out    Disposition/Plan:  Admit Pt acknowledges and agrees with plan  Supervising Physician Carmin Muskrat, MD  Final diagnoses:  Chest pain, unspecified type    New Prescriptions New Prescriptions   No medications on file     Shary Decamp, PA-C 12/30/16 1929    Carmin Muskrat, MD 12/30/16 2357

## 2016-12-31 ENCOUNTER — Encounter (HOSPITAL_COMMUNITY): Payer: Self-pay

## 2016-12-31 ENCOUNTER — Inpatient Hospital Stay (HOSPITAL_BASED_OUTPATIENT_CLINIC_OR_DEPARTMENT_OTHER): Payer: Medicare Other

## 2016-12-31 ENCOUNTER — Observation Stay (HOSPITAL_BASED_OUTPATIENT_CLINIC_OR_DEPARTMENT_OTHER): Payer: Medicare Other

## 2016-12-31 DIAGNOSIS — R079 Chest pain, unspecified: Secondary | ICD-10-CM | POA: Diagnosis not present

## 2016-12-31 DIAGNOSIS — R072 Precordial pain: Secondary | ICD-10-CM | POA: Diagnosis not present

## 2016-12-31 DIAGNOSIS — N183 Chronic kidney disease, stage 3 (moderate): Secondary | ICD-10-CM | POA: Diagnosis not present

## 2016-12-31 DIAGNOSIS — G40909 Epilepsy, unspecified, not intractable, without status epilepticus: Secondary | ICD-10-CM

## 2016-12-31 DIAGNOSIS — I10 Essential (primary) hypertension: Secondary | ICD-10-CM

## 2016-12-31 LAB — ECHOCARDIOGRAM COMPLETE
Height: 67 in
Weight: 2452.8 oz

## 2016-12-31 LAB — NM MYOCAR MULTI W/SPECT W/WALL MOTION / EF
Estimated workload: 1 METS
Exercise duration (min): 0 min
Exercise duration (sec): 0 s
MPHR: 160 {beats}/min
Peak HR: 101 {beats}/min
Percent HR: 63 %
Rest HR: 79 {beats}/min

## 2016-12-31 LAB — BASIC METABOLIC PANEL
Anion gap: 9 (ref 5–15)
BUN: 22 mg/dL — ABNORMAL HIGH (ref 6–20)
CO2: 23 mmol/L (ref 22–32)
Calcium: 9 mg/dL (ref 8.9–10.3)
Chloride: 110 mmol/L (ref 101–111)
Creatinine, Ser: 1.21 mg/dL — ABNORMAL HIGH (ref 0.44–1.00)
GFR calc Af Amer: 55 mL/min — ABNORMAL LOW (ref >60)
GFR calc non Af Amer: 48 mL/min — ABNORMAL LOW (ref >60)
Glucose, Bld: 239 mg/dL — ABNORMAL HIGH (ref 65–99)
Potassium: 4.2 mmol/L (ref 3.5–5.1)
Sodium: 142 mmol/L (ref 135–145)

## 2016-12-31 LAB — GLUCOSE, CAPILLARY
Glucose-Capillary: 144 mg/dL — ABNORMAL HIGH (ref 65–99)
Glucose-Capillary: 138 mg/dL — ABNORMAL HIGH (ref 65–99)
Glucose-Capillary: 217 mg/dL — ABNORMAL HIGH (ref 65–99)

## 2016-12-31 LAB — TROPONIN I
Troponin I: <0.03 ng/mL (ref <0.03)
Troponin I: <0.03 ng/mL (ref <0.03)

## 2016-12-31 MED ORDER — REGADENOSON 0.4 MG/5ML IV SOLN
INTRAVENOUS | Status: AC
  Filled 2016-12-31: qty 5

## 2016-12-31 MED ORDER — TECHNETIUM TC 99M TETROFOSMIN IV KIT
10.0000 | PACK | Freq: Once | INTRAVENOUS | Status: AC | PRN
  Administered 2016-12-31: 10 via INTRAVENOUS

## 2016-12-31 MED ORDER — INSULIN ASPART 100 UNIT/ML ~~LOC~~ SOLN
0.0000 [IU] | Freq: Three times a day (TID) | SUBCUTANEOUS | Status: DC
  Administered 2016-12-31: 3 [IU] via SUBCUTANEOUS

## 2016-12-31 MED ORDER — REGADENOSON 0.4 MG/5ML IV SOLN
0.4000 mg | Freq: Once | INTRAVENOUS | Status: AC
  Administered 2016-12-31: 0.4 mg via INTRAVENOUS
  Filled 2016-12-31: qty 5

## 2016-12-31 MED ORDER — TECHNETIUM TC 99M TETROFOSMIN IV KIT
30.0000 | PACK | Freq: Once | INTRAVENOUS | Status: AC | PRN
  Administered 2016-12-31: 30 via INTRAVENOUS

## 2016-12-31 NOTE — Progress Notes (Signed)
Inpatient Diabetes Program Recommendations  AACE/ADA: New Consensus Statement on Inpatient Glycemic Control (2015)  Target Ranges:  Prepandial:   less than 140 mg/dL      Peak postprandial:   less than 180 mg/dL (1-2 hours)      Critically ill patients:  140 - 180 mg/dL   Lab Results  Component Value Date   GLUCAP 138 (H) 12/31/2016   HGBA1C 12.1 12/24/2016    Review of Glycemic Control:  Results for KEIR, FOLAND (MRN [id number]) as of 12/31/2016 14:19  Ref. Range 12/30/2016 23:02 12/31/2016 08:32 12/31/2016 12:08  Glucose-Capillary Latest Ref Range: 65 - 99 mg/dL 71 144 (H) 138 (H)    Diabetes history: Type 1 according to MD's note Outpatient Diabetes medications: Lantus 30 units daily Current orders for Inpatient glycemic control:  Lantus 30 units daily, Novolog sensitive tid with meals  Inpatient Diabetes Program Recommendations:    A1C indicates poor control of diabetes prior to admit.  Likely needs meal coverage once eating.  Will follow.  According to chart review patient saw endocrinologist 12/25/15.  He notes that she is on once daily insulin regimen for compliance.   Thanks, Adah Perl, RN, BC-ADM Inpatient Diabetes Coordinator Pager [phone] (8a-5p)

## 2016-12-31 NOTE — Care Management Obs Status (Signed)
[facility] NOTIFICATION   Patient Details  Name: Tricia Huynh MRN: [id number] Date of Birth: [birth year]   Medicare Observation Status Notification Given:  Yes    Ninfa Meeker, RN 12/31/2016, 4:53 PM

## 2016-12-31 NOTE — Consult Note (Signed)
CARDIOLOGY CONSULT NOTE       Patient ID: Tricia Huynh MRN: [id number] DOB/AGE: July 24, 1956 61 y.o.  Admit date: 12/30/2016 Referring Physician:  Charlies Silvers Primary Physician: Lilian Coma, MD Primary Cardiologist: New Reason for Consultation: Chest Pain  Principal Problem:   Chest pain Active Problems:   CKD (chronic kidney disease) stage 3, GFR 30-59 ml/min   Benign essential HTN   Diabetes ([place])   Seizure disorder (HCC)   HPI:  61 y.o. HTN, long standing DM with SSCP Pain somewhat atypical sharp mid and left side chest Is positional and exertional. Better with rest Can last minutes Some dyspnea with it. Started about a week ago. No previously documented CAD. DM complicated by neuropathy CRF and retinopathy. Compliance with meds not clear Pain free this am ECG no acute changes and enzymes negative   ROS All other systems reviewed and negative except as noted above  Past Medical History:  Diagnosis Date  . Diabetes mellitus type 2 in nonobese ([place]) 06/06/2015  . Diabetes mellitus without complication ([place])   . Diabetic neuropathy ([place]) 06/06/2015  . Dyslipidemia 06/06/2015  . Encephalopathy   . Essential hypertension 06/06/2015  . Hypertension   . Mood disorder ([place]) 06/06/2015  . Seizure ([place])    sees [name]. abd eeg, on keppra    Family History  Problem Relation Age of Onset  . Heart disease Father   . Kidney disease Father   . Diabetes Mother   . Seizures Sister   . Seizures Brother     Social History   Social History  . Marital status: Single    Spouse name: N/A  . Number of children: 3  . Years of education: 10   Occupational History  . Disabled    Social History Main Topics  . Smoking status: Former Research scientist (life sciences)  . Smokeless tobacco: Never Used  . Alcohol use No  . Drug use: No  . Sexual activity: Yes   Other Topics Concern  . Not on file   Social History Narrative   Lives at home with her grandchildren.   Right-handed.   No caffeine use.    Past Surgical History:  Procedure Laterality Date  . ABDOMINAL HYSTERECTOMY    . EYE SURGERY Bilateral   . TUBAL LIGATION       . amitriptyline  50 mg Oral QHS  . amLODipine  5 mg Oral BID  . atorvastatin  40 mg Oral Daily  . enoxaparin (LOVENOX) injection  40 mg Subcutaneous Q24H  . gabapentin  600 mg Oral BID  . insulin aspart  0-9 Units Subcutaneous TID WC  . insulin glargine  30 Units Subcutaneous Daily  . levETIRAcetam  750 mg Oral BID  . metoprolol succinate  50 mg Oral QHS   . sodium chloride 75 mL/hr at 12/30/16 2317    Physical Exam: Blood pressure (!) 153/99, pulse (!) 109, temperature 98.9 F (37.2 C), temperature source Oral, resp. rate (!) 25, height 5\' 7"  (1.702 m), weight 153 lb 4.8 oz (69.5 kg), SpO2 100 %.    Affect appropriate Chronically ill black female  HEENT:poor vision  Neck supple with no adenopathy JVP normal no bruits no thyromegaly Lungs clear with no wheezing and good diaphragmatic motion Heart:  S1/S2 no murmur, no rub, gallop or click PMI normal Abdomen: benighn, BS positve, no tenderness, no AAA no bruit.  No HSM or HJR Distal pulses intact with no bruits No edema Neuro non-focal Skin warm and dry No muscular weakness  Labs:   Lab Results  Component Value Date   WBC 6.0 12/30/2016   HGB 9.9 (L) 12/30/2016   HCT 30.2 (L) 12/30/2016   MCV 85.8 12/30/2016   PLT 271 12/30/2016     Recent Labs Lab 12/30/16 1815 12/31/16 0534  NA 139 142  K 3.8 4.2  CL 104 110  CO2 25 23  BUN 27* 22*  CREATININE 1.64* 1.21*  CALCIUM 9.8 9.0  PROT 8.3*  --   BILITOT 0.6  --   ALKPHOS 177*  --   ALT 16  --   AST 21  --   GLUCOSE 295* 239*   Lab Results  Component Value Date   CKTOTAL 61 11/11/2015   TROPONINI <0.03 11/11/2015    Lab Results  Component Value Date   CHOL 130 06/07/2015   Lab Results  Component Value Date   HDL 41 06/07/2015   Lab Results  Component Value Date   LDLCALC 76 06/07/2015   Lab Results   Component Value Date   TRIG 63 06/07/2015   Lab Results  Component Value Date   CHOLHDL 3.2 06/07/2015   No results found for: LDLDIRECT    Radiology: Dg Chest 2 View  Result Date: 12/30/2016 CLINICAL DATA:  Chest pain EXAM: CHEST  2 VIEW COMPARISON:  November 14, 2016 FINDINGS: There is no edema or consolidation. Heart size and pulmonary vascularity are normal. No adenopathy. There is mild calcification in the aorta. IMPRESSION: Aortic atherosclerosis.  No edema or consolidation. Electronically Signed   By: Lowella Grip III M.D.   On: 12/30/2016 18:55    EKG:  SR no acute ST changes    ASSESSMENT AND PLAN:   Chest Pain: atypical no acute ECG changes Enzymes negative Given DM higher likelihood of CAD With CRF try to avoid cath/ dye load. Will risk stratify with myovue  DM:  Discussed low carb diet.  Target hemoglobin A1c is 6.5 or less.  Continue current medications.  Neuropathy: on amitriptyline and gabapentin   Chol:  Continue statin   HTN:  Well controlled.  Continue current medications and low sodium Dash type diet.     SignedJenkins Rouge 12/31/2016, 8:29 AM

## 2016-12-31 NOTE — Discharge Summary (Signed)
Physician Discharge Summary  Gaynel Schaafsma BEM:[id number] DOB: 1955-12-17 DOA: 12/30/2016  PCP: Lilian Coma, MD  Admit date: 12/30/2016 Discharge date: 12/31/2016  Recommendations for Outpatient Follow-up:  1. Follow up with PCP in 1 week, recheck kidney function  2. Instructed to hold hctz until PCP repeats kidney function and if Cr stable then that med can be resumed   Discharge Diagnoses:  Principal Problem:   Chest pain Active Problems:   CKD (chronic kidney disease) stage 3, GFR 30-59 ml/min   Benign essential HTN   Diabetes ([place])   Seizure disorder (HCC)    Discharge Condition: stable   Diet recommendation: as tolerated   History of present illness:  61 year old female with past medical history significant for hypertension, diabetes who presented to ED with sharp mid sternal and left-sided chest pain started about 3 days ago. Patient reported pain to be worse with exertion and she had associated shortness of breath with chest pain. Patient reported chest pain would get better after about 15 minutes with rest. No radiation. No palpitations. Initial troponin was within normal limits. No acute ischemic changes and 12-lead EKG. Cardiology has seen the patient in consultation and did [place] study for risk stratification.  Hospital Course:   Principal Problem:   Chest pain - Rule out ACS - Initial troponin within normal limit - Myoview low risk study   Active Problems:   CKD (chronic kidney disease) stage 3, GFR 30-59 ml/min - Creatinine 1.21 this morning    Benign essential HTN - Continue Norvasc, metoprolol    Diabetes mellitus with diabetic nephropathy with long-term insulin use (HCC) / diabetic neuropathy - Continue Lantus 30 units daily along with sliding scale insulin - Continue gabapentin for diabetic neuropathy    Seizure disorder ([place]) - Continue Keppra    Dyslipidemia associated with type 2 diabetes mellitus - Continue statin  therapy   DVT prophylaxis: Lovenox subcutaneous Code Status: full code  Family Communication: No family at the bedside this morning    Consultants:   Cardio   Procedures:   Myoview - low risk study   Antimicrobials:   None   Signed:  Leisa Lenz, MD  [facility] 12/31/2016, 7:16 PM  Pager #: [phone]  Time spent in minutes: less than 30 minutes   Discharge Exam: Vitals:   12/31/16 1059 12/31/16 1209  BP: 135/84 (!) 144/95  Pulse: 99   Resp: (!) 24   Temp:     Vitals:   12/31/16 1055 12/31/16 1057 12/31/16 1059 12/31/16 1209  BP: (!) 143/104 133/83 135/84 (!) 144/95  Pulse: 96 (!) 101 99   Resp: (!) 24 (!) 24 (!) 24   Temp:      TempSrc:      SpO2:      Weight:      Height:        General: Pt is alert, follows commands appropriately, not in acute distress Cardiovascular: Regular rate and rhythm, S1/S2 +, no murmurs Respiratory: Clear to auscultation bilaterally, no wheezing, no crackles, no rhonchi Abdominal: Soft, non tender, non distended, bowel sounds +, no guarding Extremities: no edema, no cyanosis, pulses palpable bilaterally DP and PT Neuro: Grossly nonfocal  Discharge Instructions  Discharge Instructions    Call MD for:  persistant nausea and vomiting    Complete by:  As directed    Call MD for:  redness, tenderness, or signs of infection (pain, swelling, redness, odor or green/yellow discharge around incision site)    Complete by:  As  directed    Call MD for:  severe uncontrolled pain    Complete by:  As directed    Diet - low sodium heart healthy    Complete by:  As directed    Discharge instructions    Complete by:  As directed    Hold Hctz until seen by PCP and recheck kidney function and make sure it is stable. Continue other medications as per prior to the admission.   Increase activity slowly    Complete by:  As directed      Allergies as of 12/31/2016      Reactions   Percocet [oxycodone-acetaminophen]  Nausea And Vomiting, Other (See Comments)   Shaky/unsteady.   Vicodin [hydrocodone-acetaminophen] Nausea And Vomiting, Other (See Comments)   Shaky/unsteady.      Medication List    STOP taking these medications   hydrochlorothiazide 25 MG tablet Commonly known as:  HYDRODIURIL     TAKE these medications   amitriptyline 50 MG tablet Commonly known as:  ELAVIL Take 50 mg by mouth at bedtime.   amLODipine-olmesartan 5-40 MG tablet Commonly known as:  AZOR Take 1 tablet by mouth daily.   atorvastatin 40 MG tablet Commonly known as:  LIPITOR Take 40 mg by mouth daily.   gabapentin 600 MG tablet Commonly known as:  NEURONTIN Take 600 mg by mouth 2 (two) times daily.   Insulin Glargine 100 UNIT/ML Solostar Pen Commonly known as:  LANTUS SOLOSTAR Inject 30 Units into the skin every morning.   levETIRAcetam 750 MG tablet Commonly known as:  KEPPRA Take 1 tablet (750 mg total) by mouth 2 (two) times daily.   metoprolol succinate 25 MG 24 hr tablet Commonly known as:  TOPROL-XL Take 50 mg by mouth at bedtime.      Follow-up Information    Lilian Coma, MD. Schedule an appointment as soon as possible for a visit.   Specialty:  Family Medicine Contact information: [address] [place] [id number] [phone]            The results of significant diagnostics from this hospitalization (including imaging, microbiology, ancillary and laboratory) are listed below for reference.    Significant Diagnostic Studies: Dg Chest 2 View  Result Date: 12/30/2016 CLINICAL DATA:  Chest pain EXAM: CHEST  2 VIEW COMPARISON:  November 14, 2016 FINDINGS: There is no edema or consolidation. Heart size and pulmonary vascularity are normal. No adenopathy. There is mild calcification in the aorta. IMPRESSION: Aortic atherosclerosis.  No edema or consolidation. Electronically Signed   By: Lowella Grip III M.D.   On: 12/30/2016 18:55   Nm Myocar Multi  W/spect W/wall Motion / Ef  Result Date: 12/31/2016 CLINICAL DATA:  Chest pain.  History of diabetes and hypertension. EXAM: MYOCARDIAL IMAGING WITH SPECT (REST AND PHARMACOLOGIC-STRESS) GATED LEFT VENTRICULAR WALL MOTION STUDY LEFT VENTRICULAR EJECTION FRACTION TECHNIQUE: Standard myocardial SPECT imaging was performed after resting intravenous injection of 10 mCi Tc-78m tetrofosmin. Subsequently, intravenous infusion of Lexiscan was performed under the supervision of the Cardiology staff. At peak effect of the drug, 30 mCi Tc-27m tetrofosmin was injected intravenously and standard myocardial SPECT imaging was performed. Quantitative gated imaging was also performed to evaluate left ventricular wall motion, and estimate left ventricular ejection fraction. COMPARISON:  None. FINDINGS: Perfusion: No decreased activity in the left ventricle on stress imaging to suggest reversible ischemia or infarction. Wall Motion: Normal left ventricular wall motion. No left ventricular dilation. Left Ventricular Ejection Fraction: 75 % End diastolic  volume 52 ml End systolic volume 13 ml IMPRESSION: 1. No reversible ischemia or infarction. 2. Normal left ventricular wall motion. 3. Left ventricular ejection fraction 75% 4. Non invasive risk stratification*: Low risk *2012 Appropriate Use Criteria for Coronary Revascularization Focused Update: J Am Coll Cardiol. 3744;51(4):604-799. http://content.airportbarriers.com.aspx?articleid=[email or web address] Electronically Signed   By: Marijo Sanes M.D.   On: 12/31/2016 16:40    Microbiology: No results found for this or any previous visit (from the past 240 hour(s)).   Labs: Basic Metabolic Panel:  Recent Labs Lab 12/30/16 1815 12/31/16 0534  NA 139 142  K 3.8 4.2  CL 104 110  CO2 25 23  GLUCOSE 295* 239*  BUN 27* 22*  CREATININE 1.64* 1.21*  CALCIUM 9.8 9.0   Liver Function Tests:  Recent Labs Lab 12/30/16 1815  AST 21  ALT 16  ALKPHOS 177*  BILITOT 0.6  PROT 8.3*   ALBUMIN 3.9   No results for input(s): LIPASE, AMYLASE in the last 168 hours. No results for input(s): AMMONIA in the last 168 hours. CBC:  Recent Labs Lab 12/30/16 1815  WBC 6.0  NEUTROABS 3.4  HGB 9.9*  HCT 30.2*  MCV 85.8  PLT 271   Cardiac Enzymes:  Recent Labs Lab 12/31/16 0834 12/31/16 1443  TROPONINI <0.03 <0.03   BNP: BNP (last 3 results) No results for input(s): BNP in the last 8760 hours.  ProBNP (last 3 results) No results for input(s): PROBNP in the last 8760 hours.  CBG:  Recent Labs Lab 12/30/16 2302 12/31/16 0832 12/31/16 1208 12/31/16 1644  GLUCAP 71 144* 138* 217*

## 2016-12-31 NOTE — Discharge Instructions (Signed)

## 2016-12-31 NOTE — Progress Notes (Signed)
   Tricia Huynh presented for a Lexiscan cardiolite today.  No immediate complications.  Stress imaging is pending at this time.  Erma Heritage, PA-C 12/31/2016, 11:05 AM

## 2016-12-31 NOTE — Care Management CC44 (Signed)
Condition Code 44 Documentation Completed  Patient Details  Name: Tricia Huynh MRN: [id number] Date of Birth: 12/10/1955   Condition Code 44 given:  Yes Patient signature on Condition Code 44 notice:  Yes Documentation of 2 MD's agreement:  Yes Code 44 added to claim:  Yes    Ninfa Meeker, RN 12/31/2016, 4:53 PM

## 2016-12-31 NOTE — Progress Notes (Signed)
Patient ID: Tricia Huynh, female   DOB: 1955-11-28, 61 y.o.   MRN: [id number]  PROGRESS NOTE    Tricia Huynh  IZT:[id number] DOB: January 14, 1956 DOA: 12/30/2016  PCP: Lilian Coma, MD   Brief Narrative:  61 year old female with past medical history significant for hypertension, diabetes who presented to ED with sharp mid sternal and left-sided chest pain started about 3 days ago. Patient reported pain to be worse with exertion and she had associated shortness of breath with chest pain. Patient reported chest pain would get better after about 15 minutes with rest. No radiation. No palpitations. Initial troponin was within normal limits. No acute ischemic changes and 12-lead EKG. Cardiology has seen the patient in consultation and plans for Myoview study for risk stratification.  Assessment & Plan:   Principal Problem:   Chest pain - Rule out ACS - Initial troponin within normal limit, cycle cardiac enzymes - Obtain 2-D echo - No acute ischemic changes on 12-lead EKG - Appreciate cardiology seeing the patient in consultation - Plan for Myoview study for risk stratification  Active Problems:   CKD (chronic kidney disease) stage 3, GFR 30-59 ml/min - Creatinine 1.21 this morning    Benign essential HTN - Continue Norvasc, metoprolol    Diabetes mellitus with diabetic nephropathy with long-term insulin use (HCC) / diabetic neuropathy - Continue Lantus 30 units daily along with sliding scale insulin - Continue gabapentin for diabetic neuropathy    Seizure disorder ([place]) - Continue Keppra    Dyslipidemia associated with type 2 diabetes mellitus - Continue statin therapy   DVT prophylaxis: Lovenox subcutaneous Code Status: full code  Family Communication: No family at the bedside this morning Disposition Plan: Plan for Myoview per cardiology   Consultants:   Cardio   Procedures:   None   Antimicrobials:   None    Subjective: No chest pain this am.    Objective: Vitals:   12/30/16 2000 12/30/16 2130 12/30/16 2300 12/30/16 2304  BP: 126/96 149/89 (!) 158/111 (!) 153/99  Pulse: 93  (!) 101 (!) 109  Resp: 16 20 19  (!) 25  Temp:    98.9 F (37.2 C)  TempSrc:    Oral  SpO2: 100%  100%   Weight:    69.5 kg (153 lb 4.8 oz)  Height:        Intake/Output Summary (Last 24 hours) at 12/31/16 0912 Last data filed at 12/31/16 0600  Gross per 24 hour  Intake          1743.75 ml  Output                0 ml  Net          1743.75 ml   Filed Weights   12/30/16 1819 12/30/16 2304  Weight: 71.7 kg (158 lb) 69.5 kg (153 lb 4.8 oz)    Examination:  General exam: Appears calm and comfortable  Respiratory system: Clear to auscultation. Respiratory effort normal. Cardiovascular system: S1 & S2 heard, RRR. No pedal edema. Gastrointestinal system: Abdomen is nondistended, soft and nontender. No organomegaly or masses felt. Normal bowel sounds heard. Central nervous system: Alert and oriented. No focal neurological deficits. Extremities: Symmetric 5 x 5 power. Skin: No rashes, lesions or ulcers Psychiatry: Judgement and insight appear normal. Mood & affect appropriate.   Data Reviewed: I have personally reviewed following labs and imaging studies  CBC:  Recent Labs Lab 12/30/16 1815  WBC 6.0  NEUTROABS 3.4  HGB 9.9*  HCT 30.2*  MCV 85.8  PLT 376   Basic Metabolic Panel:  Recent Labs Lab 12/30/16 1815 12/31/16 0534  NA 139 142  K 3.8 4.2  CL 104 110  CO2 25 23  GLUCOSE 295* 239*  BUN 27* 22*  CREATININE 1.64* 1.21*  CALCIUM 9.8 9.0   GFR: Estimated Creatinine Clearance: 48.1 mL/min (by C-G formula based on SCr of 1.21 mg/dL (H)). Liver Function Tests:  Recent Labs Lab 12/30/16 1815  AST 21  ALT 16  ALKPHOS 177*  BILITOT 0.6  PROT 8.3*  ALBUMIN 3.9   No results for input(s): LIPASE, AMYLASE in the last 168 hours. No results for input(s): AMMONIA in the last 168 hours. Coagulation Profile: No results for  input(s): INR, PROTIME in the last 168 hours. Cardiac Enzymes: No results for input(s): CKTOTAL, CKMB, CKMBINDEX, TROPONINI in the last 168 hours. BNP (last 3 results) No results for input(s): PROBNP in the last 8760 hours. HbA1C: No results for input(s): HGBA1C in the last 72 hours. CBG:  Recent Labs Lab 12/30/16 2302 12/31/16 0832  GLUCAP 71 144*   Lipid Profile: No results for input(s): CHOL, HDL, LDLCALC, TRIG, CHOLHDL, LDLDIRECT in the last 72 hours. Thyroid Function Tests: No results for input(s): TSH, T4TOTAL, FREET4, T3FREE, THYROIDAB in the last 72 hours. Anemia Panel: No results for input(s): VITAMINB12, FOLATE, FERRITIN, TIBC, IRON, RETICCTPCT in the last 72 hours. Urine analysis:    Component Value Date/Time   COLORURINE STRAW (A) 11/14/2016 2110   APPEARANCEUR CLEAR 11/14/2016 2110   LABSPEC 1.021 11/14/2016 2110   PHURINE 5.0 11/14/2016 2110   GLUCOSEU >=500 (A) 11/14/2016 2110   HGBUR NEGATIVE 11/14/2016 2110   [place] NEGATIVE 11/14/2016 2110   KETONESUR 5 (A) 11/14/2016 2110   PROTEINUR NEGATIVE 11/14/2016 2110   UROBILINOGEN 0.2 09/18/2015 1327   NITRITE POSITIVE (A) 11/14/2016 2110   LEUKOCYTESUR TRACE (A) 11/14/2016 2110   Sepsis Labs: @LABRCNTIP (procalcitonin:4,lacticidven:4)   )No results found for this or any previous visit (from the past 240 hour(s)).    Radiology Studies: Dg Chest 2 View  Result Date: 12/30/2016 Aortic atherosclerosis.  No edema or consolidation.     Scheduled Meds: . amitriptyline  50 mg Oral QHS  . amLODipine  5 mg Oral BID  . atorvastatin  40 mg Oral Daily  . enoxaparin (LOVENOX) injection  40 mg Subcutaneous Q24H  . gabapentin  600 mg Oral BID  . insulin aspart  0-9 Units Subcutaneous TID WC  . insulin glargine  30 Units Subcutaneous Daily  . levETIRAcetam  750 mg Oral BID  . metoprolol succinate  50 mg Oral QHS  . regadenoson  0.4 mg Intravenous Once   Continuous Infusions: . sodium chloride 75 mL/hr at  12/30/16 2317     LOS: 1 day    Time spent: 25 minutes  Greater than 50% of the time spent on counseling and coordinating the care.   Leisa Lenz, MD [facility] Pager [phone]  If 7PM-7AM, please contact night-coverage [email or web address] Password [facility] 12/31/2016, 9:12 AM

## 2016-12-31 NOTE — Progress Notes (Signed)
Spoke with Dr Charlies Silvers she stated she will discharge patient shortly.  IV and Telemetry removed.

## 2016-12-31 NOTE — Progress Notes (Signed)
  Patient's stress test showed:   IMPRESSION: 1. No reversible ischemia or infarction.  2. Normal left ventricular wall motion.  3. Left ventricular ejection fraction 75%  4. Non invasive risk stratification*: Low risk  Reviewed results with the patient. No further cardiac testing indicated at this time. Admitting team made aware.   Signed, Erma Heritage, PA-C 12/31/2016, 4:55 PM Pager: [phone]

## 2016-12-31 NOTE — Progress Notes (Signed)
  Echocardiogram 2D Echocardiogram has been performed.  Tricia Huynh 12/31/2016, 3:07 PM

## 2017-01-06 ENCOUNTER — Ambulatory Visit: Payer: Medicare Other | Admitting: Endocrinology

## 2017-01-07 ENCOUNTER — Encounter: Payer: Medicare Other | Admitting: Nutrition

## 2017-01-27 ENCOUNTER — Other Ambulatory Visit: Payer: Self-pay | Admitting: Endocrinology

## 2017-02-02 ENCOUNTER — Telehealth: Payer: Self-pay | Admitting: Nurse Practitioner

## 2017-02-02 NOTE — Telephone Encounter (Signed)
Yes

## 2017-02-02 NOTE — Telephone Encounter (Signed)
Caitlin Call from [facility] called asking if the manufacture can be changed for patients medication levETIRAcetam (KEPPRA) 750 MG tablet she said they are only able to reach out to  [employer]. She said that if you call [phone] and give her 1st and last name you can be routed directly to her.

## 2017-02-03 NOTE — Telephone Encounter (Signed)
I spoke to [place] with [facility].  Pt is currently on [employer] manufacturer for her generic keppra.  Per CM/NP ok to change to [facility].  She verbalized understanding.

## 2017-02-03 NOTE — Telephone Encounter (Signed)
LMVM for pt that received call about changing manufacturer of her generic keppra, may look different and have possible increase risk for sz.   She is to call me back if questions.

## 2017-02-03 NOTE — Telephone Encounter (Signed)
See other note

## 2017-02-05 ENCOUNTER — Telehealth: Payer: Self-pay | Admitting: Endocrinology

## 2017-02-20 ENCOUNTER — Other Ambulatory Visit: Payer: Self-pay | Admitting: Nurse Practitioner

## 2017-03-06 NOTE — Telephone Encounter (Signed)
Tricia Huynh from [facility] plan of care she needs patient last A1C and b/p  [phone] ex [phone]

## 2017-03-06 NOTE — Telephone Encounter (Signed)
Called the number In message but unable to get through

## 2017-03-09 NOTE — Telephone Encounter (Signed)
Number provided is not a working number.

## 2017-03-11 ENCOUNTER — Encounter (HOSPITAL_COMMUNITY): Payer: Self-pay | Admitting: Emergency Medicine

## 2017-03-11 ENCOUNTER — Emergency Department (HOSPITAL_COMMUNITY): Payer: Medicare Other

## 2017-03-11 ENCOUNTER — Inpatient Hospital Stay (HOSPITAL_COMMUNITY)
Admission: EM | Admit: 2017-03-11 | Discharge: 2017-03-16 | DRG: 195 | Disposition: A | Discharge status: Home / Self Care | Payer: Medicare Other | Attending: Internal Medicine | Admitting: Internal Medicine

## 2017-03-11 DIAGNOSIS — N183 Chronic kidney disease, stage 3 unspecified: Secondary | ICD-10-CM | POA: Diagnosis present

## 2017-03-11 DIAGNOSIS — E11319 Type 2 diabetes mellitus with unspecified diabetic retinopathy without macular edema: Secondary | ICD-10-CM | POA: Diagnosis present

## 2017-03-11 DIAGNOSIS — E118 Type 2 diabetes mellitus with unspecified complications: Secondary | ICD-10-CM | POA: Diagnosis not present

## 2017-03-11 DIAGNOSIS — Z79899 Other long term (current) drug therapy: Secondary | ICD-10-CM

## 2017-03-11 DIAGNOSIS — D72819 Decreased white blood cell count, unspecified: Secondary | ICD-10-CM | POA: Diagnosis present

## 2017-03-11 DIAGNOSIS — Z90710 Acquired absence of both cervix and uterus: Secondary | ICD-10-CM

## 2017-03-11 DIAGNOSIS — E1122 Type 2 diabetes mellitus with diabetic chronic kidney disease: Secondary | ICD-10-CM | POA: Diagnosis present

## 2017-03-11 DIAGNOSIS — Z8489 Family history of other specified conditions: Secondary | ICD-10-CM

## 2017-03-11 DIAGNOSIS — I10 Essential (primary) hypertension: Secondary | ICD-10-CM | POA: Diagnosis present

## 2017-03-11 DIAGNOSIS — R6510 Systemic inflammatory response syndrome (SIRS) of non-infectious origin without acute organ dysfunction: Secondary | ICD-10-CM

## 2017-03-11 DIAGNOSIS — E119 Type 2 diabetes mellitus without complications: Secondary | ICD-10-CM

## 2017-03-11 DIAGNOSIS — R569 Unspecified convulsions: Secondary | ICD-10-CM | POA: Diagnosis not present

## 2017-03-11 DIAGNOSIS — J189 Pneumonia, unspecified organism: Principal | ICD-10-CM | POA: Diagnosis present

## 2017-03-11 DIAGNOSIS — Z833 Family history of diabetes mellitus: Secondary | ICD-10-CM

## 2017-03-11 DIAGNOSIS — Z9114 Patient's other noncompliance with medication regimen: Secondary | ICD-10-CM

## 2017-03-11 DIAGNOSIS — Z87891 Personal history of nicotine dependence: Secondary | ICD-10-CM

## 2017-03-11 DIAGNOSIS — E785 Hyperlipidemia, unspecified: Secondary | ICD-10-CM

## 2017-03-11 DIAGNOSIS — G40909 Epilepsy, unspecified, not intractable, without status epilepticus: Secondary | ICD-10-CM | POA: Diagnosis present

## 2017-03-11 DIAGNOSIS — R531 Weakness: Secondary | ICD-10-CM | POA: Diagnosis not present

## 2017-03-11 DIAGNOSIS — F39 Unspecified mood [affective] disorder: Secondary | ICD-10-CM | POA: Diagnosis present

## 2017-03-11 DIAGNOSIS — Z794 Long term (current) use of insulin: Secondary | ICD-10-CM

## 2017-03-11 DIAGNOSIS — I129 Hypertensive chronic kidney disease with stage 1 through stage 4 chronic kidney disease, or unspecified chronic kidney disease: Secondary | ICD-10-CM | POA: Diagnosis present

## 2017-03-11 DIAGNOSIS — Z885 Allergy status to narcotic agent status: Secondary | ICD-10-CM

## 2017-03-11 DIAGNOSIS — E1140 Type 2 diabetes mellitus with diabetic neuropathy, unspecified: Secondary | ICD-10-CM | POA: Diagnosis present

## 2017-03-11 DIAGNOSIS — Z841 Family history of disorders of kidney and ureter: Secondary | ICD-10-CM

## 2017-03-11 DIAGNOSIS — Y95 Nosocomial condition: Secondary | ICD-10-CM | POA: Diagnosis present

## 2017-03-11 DIAGNOSIS — Z8249 Family history of ischemic heart disease and other diseases of the circulatory system: Secondary | ICD-10-CM

## 2017-03-11 HISTORY — DX: Pneumonia, unspecified organism: J18.9

## 2017-03-11 LAB — EXPECTORATED SPUTUM ASSESSMENT W GRAM STAIN, RFLX TO RESP C

## 2017-03-11 LAB — CBC
HCT: 29.2 % — ABNORMAL LOW (ref 36.0–46.0)
Hemoglobin: 9.4 g/dL — ABNORMAL LOW (ref 12.0–15.0)
MCH: 27.9 pg (ref 26.0–34.0)
MCHC: 32.2 g/dL (ref 30.0–36.0)
MCV: 86.6 fL (ref 78.0–100.0)
Platelets: 179 10*3/uL (ref 150–400)
RBC: 3.37 MIL/uL — ABNORMAL LOW (ref 3.87–5.11)
RDW: 12.9 % (ref 11.5–15.5)
WBC: 3.5 10*3/uL — ABNORMAL LOW (ref 4.0–10.5)

## 2017-03-11 LAB — CREATININE, SERUM
Creatinine, Ser: 1.19 mg/dL — ABNORMAL HIGH (ref 0.44–1.00)
GFR calc Af Amer: 56 mL/min — ABNORMAL LOW (ref >60)
GFR calc non Af Amer: 49 mL/min — ABNORMAL LOW (ref >60)

## 2017-03-11 LAB — CBC WITH DIFFERENTIAL/PLATELET
Basophils Absolute: 0 10*3/uL (ref 0.0–0.1)
Basophils Relative: 0 %
Eosinophils Absolute: 0 10*3/uL (ref 0.0–0.7)
Eosinophils Relative: 0 %
HCT: 30.4 % — ABNORMAL LOW (ref 36.0–46.0)
Hemoglobin: 10.2 g/dL — ABNORMAL LOW (ref 12.0–15.0)
Lymphocytes Relative: 32 %
Lymphs Abs: 1.4 10*3/uL (ref 0.7–4.0)
MCH: 28.9 pg (ref 26.0–34.0)
MCHC: 33.6 g/dL (ref 30.0–36.0)
MCV: 86.1 fL (ref 78.0–100.0)
Monocytes Absolute: 0.4 10*3/uL (ref 0.1–1.0)
Monocytes Relative: 10 %
Neutro Abs: 2.6 10*3/uL (ref 1.7–7.7)
Neutrophils Relative %: 58 %
Platelets: 195 10*3/uL (ref 150–400)
RBC: 3.53 MIL/uL — ABNORMAL LOW (ref 3.87–5.11)
RDW: 13.4 % (ref 11.5–15.5)
WBC: 4.4 10*3/uL (ref 4.0–10.5)

## 2017-03-11 LAB — I-STAT CG4 LACTIC ACID, ED: Lactic Acid, Venous: 0.9 mmol/L (ref 0.5–1.9)

## 2017-03-11 LAB — BASIC METABOLIC PANEL
Anion gap: 16 — ABNORMAL HIGH (ref 5–15)
BUN: 12 mg/dL (ref 6–20)
CO2: 19 mmol/L — ABNORMAL LOW (ref 22–32)
Calcium: 8.9 mg/dL (ref 8.9–10.3)
Chloride: 101 mmol/L (ref 101–111)
Creatinine, Ser: 1.28 mg/dL — ABNORMAL HIGH (ref 0.44–1.00)
GFR calc Af Amer: 52 mL/min — ABNORMAL LOW (ref >60)
GFR calc non Af Amer: 45 mL/min — ABNORMAL LOW (ref >60)
Glucose, Bld: 173 mg/dL — ABNORMAL HIGH (ref 65–99)
Potassium: 3.5 mmol/L (ref 3.5–5.1)
Sodium: 136 mmol/L (ref 135–145)

## 2017-03-11 LAB — INFLUENZA PANEL BY PCR (TYPE A & B)
Influenza A By PCR: NEGATIVE
Influenza B By PCR: NEGATIVE

## 2017-03-11 LAB — GLUCOSE, CAPILLARY
Glucose-Capillary: 150 mg/dL — ABNORMAL HIGH (ref 65–99)
Glucose-Capillary: 121 mg/dL — ABNORMAL HIGH (ref 65–99)
Glucose-Capillary: 152 mg/dL — ABNORMAL HIGH (ref 65–99)

## 2017-03-11 LAB — STREP PNEUMONIAE URINARY ANTIGEN: Strep Pneumo Urinary Antigen: NEGATIVE

## 2017-03-11 MED ORDER — PIPERACILLIN-TAZOBACTAM 3.375 G IVPB
3.3750 g | Freq: Three times a day (TID) | INTRAVENOUS | Status: DC
  Administered 2017-03-11 – 2017-03-16 (×15): 3.375 g via INTRAVENOUS
  Filled 2017-03-11 (×16): qty 50

## 2017-03-11 MED ORDER — ONDANSETRON HCL 4 MG/2ML IJ SOLN: 4.0000 mg | Freq: Four times a day (QID) | INTRAMUSCULAR | Status: DC | PRN

## 2017-03-11 MED ORDER — AMLODIPINE BESYLATE 5 MG PO TABS
5.0000 mg | ORAL_TABLET | Freq: Every day | ORAL | Status: DC
  Administered 2017-03-11 – 2017-03-16 (×6): 5 mg via ORAL
  Filled 2017-03-11 (×6): qty 1

## 2017-03-11 MED ORDER — IRBESARTAN 300 MG PO TABS
300.0000 mg | ORAL_TABLET | Freq: Every day | ORAL | Status: DC
  Administered 2017-03-11 – 2017-03-16 (×6): 300 mg via ORAL
  Filled 2017-03-11 (×7): qty 1

## 2017-03-11 MED ORDER — SODIUM CHLORIDE 0.9 % IV SOLN
INTRAVENOUS | Status: AC
  Administered 2017-03-11: 16:00:00 via INTRAVENOUS

## 2017-03-11 MED ORDER — VANCOMYCIN HCL IN DEXTROSE 1-5 GM/200ML-% IV SOLN
1000.0000 mg | Freq: Once | INTRAVENOUS | Status: AC
  Administered 2017-03-11: 1000 mg via INTRAVENOUS
  Filled 2017-03-11: qty 200

## 2017-03-11 MED ORDER — ACETAMINOPHEN 325 MG PO TABS
650.0000 mg | ORAL_TABLET | Freq: Four times a day (QID) | ORAL | Status: DC | PRN
  Administered 2017-03-12 – 2017-03-13 (×4): 650 mg via ORAL
  Filled 2017-03-11 (×4): qty 2

## 2017-03-11 MED ORDER — ACETAMINOPHEN 500 MG PO TABS
1000.0000 mg | ORAL_TABLET | Freq: Once | ORAL | Status: AC
  Administered 2017-03-11: 1000 mg via ORAL
  Filled 2017-03-11: qty 2

## 2017-03-11 MED ORDER — SODIUM CHLORIDE 0.9 % IV SOLN
500.0000 mg | Freq: Two times a day (BID) | INTRAVENOUS | Status: DC
Start: 2017-03-11 — End: 2017-03-14
  Administered 2017-03-11 – 2017-03-14 (×6): 500 mg via INTRAVENOUS
  Filled 2017-03-11 (×6): qty 500

## 2017-03-11 MED ORDER — INSULIN GLARGINE 100 UNIT/ML ~~LOC~~ SOLN
30.0000 [IU] | Freq: Every day | SUBCUTANEOUS | Status: DC
  Administered 2017-03-12 – 2017-03-14 (×3): 30 [IU] via SUBCUTANEOUS
  Filled 2017-03-11 (×4): qty 0.3

## 2017-03-11 MED ORDER — INSULIN ASPART 100 UNIT/ML ~~LOC~~ SOLN
0.0000 [IU] | Freq: Three times a day (TID) | SUBCUTANEOUS | Status: DC
  Administered 2017-03-11 (×2): 1 [IU] via SUBCUTANEOUS
  Administered 2017-03-12 – 2017-03-13 (×3): 2 [IU] via SUBCUTANEOUS
  Administered 2017-03-13 – 2017-03-16 (×2): 1 [IU] via SUBCUTANEOUS

## 2017-03-11 MED ORDER — SODIUM CHLORIDE 0.9 % IV BOLUS (SEPSIS)
1000.0000 mL | Freq: Once | INTRAVENOUS | Status: AC
  Administered 2017-03-11: 1000 mL via INTRAVENOUS

## 2017-03-11 MED ORDER — LEVETIRACETAM 750 MG PO TABS
750.0000 mg | ORAL_TABLET | Freq: Two times a day (BID) | ORAL | Status: DC
  Administered 2017-03-11 – 2017-03-16 (×11): 750 mg via ORAL
  Filled 2017-03-11 (×12): qty 1

## 2017-03-11 MED ORDER — INSULIN GLARGINE 100 UNIT/ML SOLOSTAR PEN: 30.0000 [IU] | PEN_INJECTOR | Freq: Every day | SUBCUTANEOUS | Status: DC

## 2017-03-11 MED ORDER — AMITRIPTYLINE HCL 50 MG PO TABS
50.0000 mg | ORAL_TABLET | Freq: Every day | ORAL | Status: DC
  Administered 2017-03-11 – 2017-03-15 (×5): 50 mg via ORAL
  Filled 2017-03-11 (×5): qty 1

## 2017-03-11 MED ORDER — AMLODIPINE-OLMESARTAN 5-40 MG PO TABS: 1.0000 | ORAL_TABLET | Freq: Every day | ORAL | Status: DC

## 2017-03-11 MED ORDER — ATORVASTATIN CALCIUM 40 MG PO TABS
40.0000 mg | ORAL_TABLET | Freq: Every day | ORAL | Status: DC
  Administered 2017-03-11 – 2017-03-16 (×6): 40 mg via ORAL
  Filled 2017-03-11 (×7): qty 1

## 2017-03-11 MED ORDER — PIPERACILLIN-TAZOBACTAM 3.375 G IVPB 30 MIN
3.3750 g | Freq: Once | INTRAVENOUS | Status: AC
  Administered 2017-03-11: 3.375 g via INTRAVENOUS
  Filled 2017-03-11: qty 50

## 2017-03-11 MED ORDER — GUAIFENESIN ER 600 MG PO TB12
600.0000 mg | ORAL_TABLET | Freq: Two times a day (BID) | ORAL | Status: DC
  Administered 2017-03-11 – 2017-03-16 (×11): 600 mg via ORAL
  Filled 2017-03-11 (×11): qty 1

## 2017-03-11 MED ORDER — IPRATROPIUM-ALBUTEROL 0.5-2.5 (3) MG/3ML IN SOLN: 3.0000 mL | RESPIRATORY_TRACT | Status: DC | PRN

## 2017-03-11 MED ORDER — GABAPENTIN 300 MG PO CAPS
600.0000 mg | ORAL_CAPSULE | Freq: Two times a day (BID) | ORAL | Status: DC
  Administered 2017-03-11 – 2017-03-16 (×11): 600 mg via ORAL
  Filled 2017-03-11 (×11): qty 2

## 2017-03-11 MED ORDER — SODIUM CHLORIDE 0.9% FLUSH
3.0000 mL | Freq: Two times a day (BID) | INTRAVENOUS | Status: DC
  Administered 2017-03-11 – 2017-03-15 (×4): 3 mL via INTRAVENOUS

## 2017-03-11 MED ORDER — POLYETHYLENE GLYCOL 3350 17 G PO PACK: 17.0000 g | PACK | Freq: Every day | ORAL | Status: DC | PRN

## 2017-03-11 MED ORDER — METOPROLOL SUCCINATE ER 50 MG PO TB24
50.0000 mg | ORAL_TABLET | Freq: Every day | ORAL | Status: DC
  Administered 2017-03-11 – 2017-03-15 (×5): 50 mg via ORAL
  Filled 2017-03-11 (×5): qty 1

## 2017-03-11 MED ORDER — ONDANSETRON HCL 4 MG PO TABS: 4.0000 mg | ORAL_TABLET | Freq: Four times a day (QID) | ORAL | Status: DC | PRN

## 2017-03-11 MED ORDER — HEPARIN SODIUM (PORCINE) 5000 UNIT/ML IJ SOLN
5000.0000 [IU] | Freq: Three times a day (TID) | INTRAMUSCULAR | Status: DC
  Administered 2017-03-11 – 2017-03-16 (×15): 5000 [IU] via SUBCUTANEOUS
  Filled 2017-03-11 (×15): qty 1

## 2017-03-11 NOTE — H&P (Signed)
History and Physical    Tricia Huynh XQJ:[id number] DOB: June 01, 1956 DOA: 03/11/2017  PCP: Lilian Coma, MD  Patient coming from: Home  Chief Complaint: cough, weakness and chills  HPI: Tricia Huynh is a 61 y.o. female with medical history significant HTN, DM type II, CKD stage III, seizure disorder noncompliant with Keppra, impaired memory who presented to the ED with c/o generalized weakness, cough - nonproductive, SOB, runny nose and myalgia, subjective fevers and chills that started abruptly yesterday.She stated that she spent the whole day in bed yesterday d/t not feeling good.  Patient was recently hospitalized for chest pain and discharged on 12/30/16 with. During the hospitalization she underwent Myoview stress test that turned out to be a low risk study. Patient was instructed to have a follow up visit with nephrologist in a week after discharge to assure the resolution of acute renal failure  ED Course: On arrival to the emergency department she was Febrile with temperature 99.79F, cardiac with heart rate in 110's, but have controlled blood pressure and oxygen saturation level. Blood work demonstrated normal white blood cells count, hemoglobin 10.2 which is near baseline, creatinine was 1.28 and BUN 12, which is better than in February when creatinine was 1.64 and BUN 27.  Lactic acid was normal-0.90 Chest x-ray demonstrated filtrate consistent with pneumonia in the right middle lobe   Review of Systems: As per HPI otherwise 10 point review of systems negative.   Ambulatory Status: Independent   Past Medical History:  Diagnosis Date  . Diabetes mellitus type 2 in nonobese ([place]) 06/06/2015  . Diabetes mellitus without complication ([place])   . Diabetic neuropathy ([place]) 06/06/2015  . Dyslipidemia 06/06/2015  . Encephalopathy   . Essential hypertension 06/06/2015  . Hypertension   . Mood disorder ([place]) 06/06/2015  . Seizure [facility])    sees [name]. abd eeg, on keppra      Past Surgical History:  Procedure Laterality Date  . ABDOMINAL HYSTERECTOMY    . EYE SURGERY Bilateral   . TUBAL LIGATION      Social History   Social History  . Marital status: Single    Spouse name: N/A  . Number of children: 3  . Years of education: 10   Occupational History  . Disabled    Social History Main Topics  . Smoking status: Former Research scientist (life sciences)  . Smokeless tobacco: Never Used  . Alcohol use No  . Drug use: No  . Sexual activity: Yes   Other Topics Concern  . Not on file   Social History Narrative   Lives at home with her grandchildren.   Right-handed.   No caffeine use.    Allergies  Allergen Reactions  . Percocet [Oxycodone-Acetaminophen] Nausea And Vomiting and Other (See Comments)    Shaky/unsteady.  . Vicodin [Hydrocodone-Acetaminophen] Nausea And Vomiting and Other (See Comments)    Shaky/unsteady.    Family History  Problem Relation Age of Onset  . Heart disease Father   . Kidney disease Father   . Diabetes Mother   . Seizures Sister   . Seizures Brother     Prior to Admission medications   Medication Sig Start Date End Date Taking? Authorizing Provider  amitriptyline (ELAVIL) 50 MG tablet Take 50 mg by mouth at bedtime. 05/08/15  Yes Historical Provider, MD  amLODipine-olmesartan (AZOR) 5-40 MG tablet Take 1 tablet by mouth daily. 10/08/16  Yes Historical Provider, MD  atorvastatin (LIPITOR) 40 MG tablet Take 40 mg by mouth daily. 05/10/15  Yes Historical  Provider, MD  gabapentin (NEURONTIN) 600 MG tablet Take 600 mg by mouth 2 (two) times daily.  10/08/14  Yes Historical Provider, MD  LANTUS SOLOSTAR 100 UNIT/ML Solostar Pen INJECT [place] MORNING Patient taking differently: inject 30 units subcutaneously every morning 01/28/17  Yes Renato Shin, MD  levETIRAcetam (KEPPRA) 750 MG tablet Take 1 tablet by mouth twice daily. 02/20/17  Yes Marcial Pacas, MD  metoprolol succinate (TOPROL-XL) 25 MG 24 hr tablet Take 50 mg by mouth at  bedtime. 05/08/15  Yes Historical Provider, MD    Physical Exam: Vitals:   03/11/17 0731 03/11/17 0735 03/11/17 0830 03/11/17 0845  BP: (!) 171/95  (!) 155/96 (!) 165/108  Pulse: (!) 113  (!) 103 (!) 105  Resp: 19     Temp: 99.3 F (37.4 C)     TempSrc: Oral     SpO2: 96%  97% 98%  Weight: 69.4 kg (153 lb) 69.4 kg (153 lb)    Height: 5\' 7"  (1.702 m) 5\' 7"  (1.702 m)       General: Appears calm and comfortable Eyes: PERRLA, EOMI, normal lids, iris ENT:  grossly normal hearing, lips & tongue, mucous membranes moist and intact Neck: no lymphoadenopathy, masses or thyromegaly Cardiovascular: RRR, no m/r/g. No JVD, carotid bruits. No LE edema.  Respiratory: bilateral no wheezes, rales, audible rhonchi within right middle lobe . Normal respiratory effort. No accessory muscle use observed Abdomen: soft, non-tender, non-distended, no organomegaly or masses appreciated. BS present in all quadrants Skin: no rash, ulcers or induration seen on limited exam Musculoskeletal: grossly normal tone BUE/BLE, good ROM, no bony abnormality or joint deformities observed Psychiatric: grossly normal mood and affect, speech fluent and appropriate, alert and oriented x3 Neurologic: CN II-XII grossly intact, moves all extremities in coordinated fashion, sensation intact  Labs on Admission: I have personally reviewed following labs and imaging studies  CBC, BMP  GFR: Estimated Creatinine Clearance: 45.5 mL/min (A) (by C-G formula based on SCr of 1.28 mg/dL (H)).   Creatinine Clearance: Estimated Creatinine Clearance: 45.5 mL/min (A) (by C-G formula based on SCr of 1.28 mg/dL (H)).    Radiological Exams on Admission: Dg Chest 2 View  Result Date: 03/11/2017 CLINICAL DATA:  Cough and congestion.  Hypertension EXAM: CHEST  2 VIEW COMPARISON:  December 30, 2016 FINDINGS: There is patchy infiltrate in the right middle lobe with mild volume loss. There is slight atelectasis in the left base. The lungs  elsewhere clear. Heart size and pulmonary vascularity are normal. No adenopathy. There is aortic atherosclerosis. No bone lesions. IMPRESSION: Patchy infiltrate consistent with pneumonia in the right middle lobe. Mild left base atelectasis. Lungs elsewhere clear. There is aortic atherosclerosis. Followup PA and lateral chest radiographs recommended in 3-4 weeks following trial of antibiotic therapy to ensure resolution and exclude underlying malignancy. Electronically Signed   By: Lowella Grip III M.D.   On: 03/11/2017 08:12    EKG: no found    Assessment/Plan Principal Problem:   HCAP (healthcare-associated pneumonia) Active Problems:   Seizures (HCC)   CKD (chronic kidney disease) stage 3, GFR 30-59 ml/min   Benign essential HTN   Diabetes ([place])   Dyslipidemia     HCAP Continue IV Vancomycin and Zosyn IV per pharmacy Start Guaifenesin, Nebulizer treatments prn Monitor WBC's count and fevers  DM type II with diabetic neuropathy and retinopathy  Continue Insuline home doses, Neurontin, add SSI Continue carb controlled diet  Hypertension - currently stable Continue home medication and adjust  the doses if needed depending on the BP readings  CKD stage III Continue to monitor, creatinine near baseline, avoid nephrotoxic drugs  Dyslipidemia - continue Lipitor  Seizure - continue Keppra  DVT prophylaxis: heparin Code Status: Full Family Communication: none Disposition Plan: MedSurg tele Consults called: none Admission status: observation   York Grice, PA-C Pager: [phone] [facility]  If 7PM-7AM, please contact night-coverage [email or web address] Password [facility]  03/11/2017, 9:51 AM

## 2017-03-11 NOTE — ED Notes (Signed)
Patient transported to X-ray 

## 2017-03-11 NOTE — ED Provider Notes (Signed)
[facility] DEPT Provider Note   CSN: [id number] Arrival date & time: 03/11/17  0715     History   Chief Complaint Chief Complaint  Patient presents with  . Chills  . Fever  . Cough    HPI Tricia Huynh is a 61 y.o. female.  HPI   61 yo F with PMHx of HTN, HLD, DM2 with h/o nonadherence, seizure d/o on Keppra, here with generalized fatigue, fevers, and cough. Sx started yesterday as diffuse weakness, fatigue, and poor appetite. Over the past 24 hours, she has developed worsening non productive cough, SOB, and fever at home with chills. She has had mild nausea but no vomiting. No significant abdominal pain. She has had decreased UOP as well and feels like she has had to force herself to urinate. No recent sick contacts. She has not taken any meds for this. Unsure what her sugars have been running as she does not have a glucometer. Sx worse with any movement or exertion. No alleviating factors..  Past Medical History:  Diagnosis Date  . Diabetes mellitus type 2 in nonobese ([place]) 06/06/2015  . Diabetes mellitus without complication ([place])   . Diabetic neuropathy ([place]) 06/06/2015  . Dyslipidemia 06/06/2015  . Encephalopathy   . Essential hypertension 06/06/2015  . Hypertension   . Mood disorder ([place]) 06/06/2015  . Seizure [facility])    sees [name]. abd eeg, on keppra    Patient Active Problem List   Diagnosis Date Noted  . HCAP (healthcare-associated pneumonia) 03/11/2017  . Dyslipidemia 03/11/2017  . Chest pain 12/30/2016  . Seizure disorder ([place]) 12/30/2016  . Diabetic ketoacidosis without coma associated with type 2 diabetes mellitus ([place])   . DKA (diabetic ketoacidoses) ([place]) 11/15/2016  . UTI (urinary tract infection) 11/15/2016  . Acute cystitis without hematuria   . Acute renal failure ([place])   . Hyperglycemia 11/14/2016  . Diabetes ([place]) 06/05/2016  . Altered mental status   . CKD (chronic kidney disease) stage 3, GFR 30-59 ml/min 05/26/2016  . Benign essential  HTN 05/26/2016  . Seizures ([place]) 01/08/2016  . Memory loss 10/04/2015  . Acute encephalopathy 06/06/2015  . Diabetic neuropathy ([place]) 06/06/2015    Past Surgical History:  Procedure Laterality Date  . ABDOMINAL HYSTERECTOMY    . EYE SURGERY Bilateral   . TUBAL LIGATION      OB History    No data available       Home Medications    Prior to Admission medications   Medication Sig Start Date End Date Taking? Authorizing Provider  amitriptyline (ELAVIL) 50 MG tablet Take 50 mg by mouth at bedtime. 05/08/15  Yes Historical Provider, MD  amLODipine-olmesartan (AZOR) 5-40 MG tablet Take 1 tablet by mouth daily. 10/08/16  Yes Historical Provider, MD  atorvastatin (LIPITOR) 40 MG tablet Take 40 mg by mouth daily. 05/10/15  Yes Historical Provider, MD  gabapentin (NEURONTIN) 600 MG tablet Take 600 mg by mouth 2 (two) times daily.  10/08/14  Yes Historical Provider, MD  LANTUS SOLOSTAR 100 UNIT/ML Solostar Pen INJECT [place] MORNING Patient taking differently: inject 30 units subcutaneously every morning 01/28/17  Yes Renato Shin, MD  levETIRAcetam (KEPPRA) 750 MG tablet Take 1 tablet by mouth twice daily. 02/20/17  Yes Marcial Pacas, MD  metoprolol succinate (TOPROL-XL) 25 MG 24 hr tablet Take 50 mg by mouth at bedtime. 05/08/15  Yes Historical Provider, MD    Family History Family History  Problem Relation Age of Onset  . Heart disease Father   .  Kidney disease Father   . Diabetes Mother   . Seizures Sister   . Seizures Brother     Social History Social History  Substance Use Topics  . Smoking status: Former Research scientist (life sciences)  . Smokeless tobacco: Never Used  . Alcohol use No     Allergies   Percocet [oxycodone-acetaminophen] and Vicodin [hydrocodone-acetaminophen]   Review of Systems Review of Systems  Constitutional: Positive for chills, fatigue and fever.  HENT: Negative for congestion, rhinorrhea and sore throat.   Eyes: Negative for visual disturbance.    Respiratory: Positive for cough and shortness of breath. Negative for wheezing.   Cardiovascular: Negative for chest pain and leg swelling.  Gastrointestinal: Negative for abdominal pain, diarrhea, nausea and vomiting.  Genitourinary: Positive for decreased urine volume. Negative for dysuria, flank pain, vaginal bleeding and vaginal discharge.  Musculoskeletal: Negative for neck pain.  Skin: Negative for rash.  Allergic/Immunologic: Negative for immunocompromised state.  Neurological: Positive for weakness. Negative for syncope and headaches.  Hematological: Does not bruise/bleed easily.  All other systems reviewed and are negative.    Physical Exam Updated Vital Signs BP (!) 165/108   Pulse (!) 105   Temp 99.3 F (37.4 C) (Oral)   Resp 19   Ht 5\' 7"  (1.702 m)   Wt 153 lb (69.4 kg)   SpO2 98%   BMI 23.96 kg/m   Physical Exam  Constitutional: She is oriented to person, place, and time. She appears well-developed and well-nourished. No distress.  HENT:  Head: Normocephalic and atraumatic.  Mouth/Throat: Oropharynx is clear and moist.  Eyes: Conjunctivae are normal.  Neck: Neck supple.  Cardiovascular: Normal rate, regular rhythm and normal heart sounds.  Exam reveals no friction rub.   No murmur heard. Pulmonary/Chest: Effort normal. No accessory muscle usage. Tachypnea noted. No respiratory distress. She has decreased breath sounds. She has no wheezes. She has no rales.  Abdominal: She exhibits no distension.  Musculoskeletal: She exhibits no edema.  Neurological: She is alert and oriented to person, place, and time. She exhibits normal muscle tone.  Skin: Skin is warm. Capillary refill takes less than 2 seconds.  Psychiatric: She has a normal mood and affect.  Nursing note and vitals reviewed.    ED Treatments / Results  Labs (all labs ordered are listed, but only abnormal results are displayed) Labs Reviewed  CBC WITH DIFFERENTIAL/PLATELET - Abnormal; Notable for  the following:       Result Value   RBC 3.53 (*)    Hemoglobin 10.2 (*)    HCT 30.4 (*)    All other components within normal limits  BASIC METABOLIC PANEL - Abnormal; Notable for the following:    CO2 19 (*)    Glucose, Bld 173 (*)    Creatinine, Ser 1.28 (*)    GFR calc non Af Amer 45 (*)    GFR calc Af Amer 52 (*)    Anion gap 16 (*)    All other components within normal limits  CULTURE, BLOOD (ROUTINE X 2)  CULTURE, BLOOD (ROUTINE X 2)  INFLUENZA PANEL BY PCR (TYPE A & B)  I-STAT CG4 LACTIC ACID, ED    EKG  EKG Interpretation None       Radiology Dg Chest 2 View  Result Date: 03/11/2017 CLINICAL DATA:  Cough and congestion.  Hypertension EXAM: CHEST  2 VIEW COMPARISON:  December 30, 2016 FINDINGS: There is patchy infiltrate in the right middle lobe with mild volume loss. There is slight atelectasis in the  left base. The lungs elsewhere clear. Heart size and pulmonary vascularity are normal. No adenopathy. There is aortic atherosclerosis. No bone lesions. IMPRESSION: Patchy infiltrate consistent with pneumonia in the right middle lobe. Mild left base atelectasis. Lungs elsewhere clear. There is aortic atherosclerosis. Followup PA and lateral chest radiographs recommended in 3-4 weeks following trial of antibiotic therapy to ensure resolution and exclude underlying malignancy. Electronically Signed   By: Lowella Grip III M.D.   On: 03/11/2017 08:12    Procedures Procedures (including critical care time)  Medications Ordered in ED Medications  piperacillin-tazobactam (ZOSYN) IVPB 3.375 g (3.375 g Intravenous New Bag/Given 03/11/17 0933)  vancomycin (VANCOCIN) IVPB 1000 mg/200 mL premix (not administered)  piperacillin-tazobactam (ZOSYN) IVPB 3.375 g (not administered)  vancomycin (VANCOCIN) 500 mg in sodium chloride 0.9 % 100 mL IVPB (not administered)  sodium chloride 0.9 % bolus 1,000 mL (0 mLs Intravenous Stopped 03/11/17 0855)  acetaminophen (TYLENOL) tablet 1,000  mg (1,000 mg Oral Given 03/11/17 0934)     Initial Impression / Assessment and Plan / ED Course  I have reviewed the triage vital signs and the nursing notes.  Pertinent labs & imaging results that were available during my care of the patient were reviewed by me and considered in my medical decision making (see chart for details).    61 yo F with PMHx of HTN, HLD, DM2 with h/o nonadherence, seizure d/o on Keppra, here with cough, fever, poor appetite. On arrival, pt tachycardic but not hypoxic. Labs show normal WBC but BMP with CO2 19, AG 16. CXR c/f RML PNA. Pt does have recent hospitalization in last 3 months with h/o recurrent hospitalizations. Will treat for HAP, continue IVF, and admit to Hospitalist. Pt in agreement. HR improving with IVF and antipyretics.  Final Clinical Impressions(s) / ED Diagnoses   Final diagnoses:  HAP (hospital-acquired pneumonia)  SIRS (systemic inflammatory response syndrome) (HCC)      Duffy Bruce, MD 03/11/17 1000

## 2017-03-11 NOTE — ED Triage Notes (Signed)
Pt in from home, c/o flu-like symptoms x 1 day. Low grade fever, 99.8. c/o np cough, weakness and chills. VSS, 97% on RA

## 2017-03-11 NOTE — Progress Notes (Signed)
Tricia Huynh is a 61 y.o. female patient admitted from ED awake, alert - oriented  X 4 - no acute distress noted.  VSS - Blood pressure 133/89, pulse 90, temperature 99.3 F (37.4 C), temperature source Oral, resp. rate 19, height 5\' 7"  (1.702 m), weight 69.4 kg (153 lb), SpO2 97 %.    IV in place, occlusive dsg intact without redness.  Orientation to room, and floor completed with information packet given to patient/family.  Patient declined safety video at this time.  Admission INP armband ID verified with patient/family, and in place.   SR up x 2, fall assessment complete, with patient and family able to verbalize understanding of risk associated with falls, and verbalized understanding to call nsg before up out of bed.  Call light within reach, patient able to voice, and demonstrate understanding.  Skin, clean-dry- intact without evidence of bruising, or skin tears.   No evidence of skin break down noted on exam.     Will cont to eval and treat per MD orders.  Celine Ahr, RN 03/11/2017 1:10 PM

## 2017-03-11 NOTE — Progress Notes (Signed)
Received report on pt.

## 2017-03-11 NOTE — ED Notes (Signed)
Attempted to call report x 1  

## 2017-03-11 NOTE — Progress Notes (Signed)
Pharmacy Antibiotic Note  Tricia Huynh is a 61 y.o. female admitted on 03/11/2017 with pneumonia.  Pharmacy has been consulted for vancomycin and zosyn dosing.  Patient received vancomycin 1g and zosyn 3.375g IV once in the ED.  Plan: Vancomycin 500mg  IV every 12 hours.  Goal trough 15-20 mcg/mL. Zosyn 3.375g IV q8h (4 hour infusion).  Monitor culture data, renal function and clinical course VT at SS prn  Height: 5\' 7"  (170.2 cm) Weight: 153 lb (69.4 kg) IBW/kg (Calculated) : 61.6  Temp (24hrs), Avg:99.3 F (37.4 C), Min:99.3 F (37.4 C), Max:99.3 F (37.4 C)   Recent Labs Lab 03/11/17 0754 03/11/17 0808  WBC 4.4  --   CREATININE 1.28*  --   LATICACIDVEN  --  0.90    Estimated Creatinine Clearance: 45.5 mL/min (A) (by C-G formula based on SCr of 1.28 mg/dL (H)).    Allergies  Allergen Reactions  . Percocet [Oxycodone-Acetaminophen] Nausea And Vomiting and Other (See Comments)    Shaky/unsteady.  . Vicodin [Hydrocodone-Acetaminophen] Nausea And Vomiting and Other (See Comments)    Shaky/unsteady.     Andrey Cota. Diona Foley, PharmD, BCPS Clinical Pharmacist [phone] 03/11/2017 8:45 AM

## 2017-03-12 DIAGNOSIS — J189 Pneumonia, unspecified organism: Secondary | ICD-10-CM | POA: Diagnosis present

## 2017-03-12 DIAGNOSIS — Z8249 Family history of ischemic heart disease and other diseases of the circulatory system: Secondary | ICD-10-CM | POA: Diagnosis not present

## 2017-03-12 DIAGNOSIS — G40909 Epilepsy, unspecified, not intractable, without status epilepticus: Secondary | ICD-10-CM | POA: Diagnosis present

## 2017-03-12 DIAGNOSIS — N183 Chronic kidney disease, stage 3 (moderate): Secondary | ICD-10-CM

## 2017-03-12 DIAGNOSIS — Z841 Family history of disorders of kidney and ureter: Secondary | ICD-10-CM | POA: Diagnosis not present

## 2017-03-12 DIAGNOSIS — Z833 Family history of diabetes mellitus: Secondary | ICD-10-CM | POA: Diagnosis not present

## 2017-03-12 DIAGNOSIS — E785 Hyperlipidemia, unspecified: Secondary | ICD-10-CM | POA: Diagnosis present

## 2017-03-12 DIAGNOSIS — Z87891 Personal history of nicotine dependence: Secondary | ICD-10-CM | POA: Diagnosis not present

## 2017-03-12 DIAGNOSIS — F39 Unspecified mood [affective] disorder: Secondary | ICD-10-CM | POA: Diagnosis present

## 2017-03-12 DIAGNOSIS — E1140 Type 2 diabetes mellitus with diabetic neuropathy, unspecified: Secondary | ICD-10-CM | POA: Diagnosis present

## 2017-03-12 DIAGNOSIS — E11319 Type 2 diabetes mellitus with unspecified diabetic retinopathy without macular edema: Secondary | ICD-10-CM | POA: Diagnosis present

## 2017-03-12 DIAGNOSIS — E1122 Type 2 diabetes mellitus with diabetic chronic kidney disease: Secondary | ICD-10-CM | POA: Diagnosis present

## 2017-03-12 DIAGNOSIS — Z9114 Patient's other noncompliance with medication regimen: Secondary | ICD-10-CM | POA: Diagnosis not present

## 2017-03-12 DIAGNOSIS — D72819 Decreased white blood cell count, unspecified: Secondary | ICD-10-CM | POA: Diagnosis present

## 2017-03-12 DIAGNOSIS — Z90710 Acquired absence of both cervix and uterus: Secondary | ICD-10-CM | POA: Diagnosis not present

## 2017-03-12 DIAGNOSIS — Z794 Long term (current) use of insulin: Secondary | ICD-10-CM | POA: Diagnosis not present

## 2017-03-12 DIAGNOSIS — Z8489 Family history of other specified conditions: Secondary | ICD-10-CM | POA: Diagnosis not present

## 2017-03-12 DIAGNOSIS — R531 Weakness: Secondary | ICD-10-CM | POA: Diagnosis present

## 2017-03-12 DIAGNOSIS — Z79899 Other long term (current) drug therapy: Secondary | ICD-10-CM | POA: Diagnosis not present

## 2017-03-12 DIAGNOSIS — Z885 Allergy status to narcotic agent status: Secondary | ICD-10-CM | POA: Diagnosis not present

## 2017-03-12 DIAGNOSIS — E1042 Type 1 diabetes mellitus with diabetic polyneuropathy: Secondary | ICD-10-CM | POA: Diagnosis not present

## 2017-03-12 DIAGNOSIS — I10 Essential (primary) hypertension: Secondary | ICD-10-CM

## 2017-03-12 DIAGNOSIS — I129 Hypertensive chronic kidney disease with stage 1 through stage 4 chronic kidney disease, or unspecified chronic kidney disease: Secondary | ICD-10-CM | POA: Diagnosis present

## 2017-03-12 DIAGNOSIS — Y95 Nosocomial condition: Secondary | ICD-10-CM | POA: Diagnosis present

## 2017-03-12 LAB — BASIC METABOLIC PANEL
Anion gap: 10 (ref 5–15)
BUN: 14 mg/dL (ref 6–20)
CO2: 22 mmol/L (ref 22–32)
Calcium: 8.5 mg/dL — ABNORMAL LOW (ref 8.9–10.3)
Chloride: 105 mmol/L (ref 101–111)
Creatinine, Ser: 1.28 mg/dL — ABNORMAL HIGH (ref 0.44–1.00)
GFR calc Af Amer: 52 mL/min — ABNORMAL LOW (ref >60)
GFR calc non Af Amer: 45 mL/min — ABNORMAL LOW (ref >60)
Glucose, Bld: 175 mg/dL — ABNORMAL HIGH (ref 65–99)
Potassium: 3.8 mmol/L (ref 3.5–5.1)
Sodium: 137 mmol/L (ref 135–145)

## 2017-03-12 LAB — CBC
HCT: 28.4 % — ABNORMAL LOW (ref 36.0–46.0)
Hemoglobin: 9.1 g/dL — ABNORMAL LOW (ref 12.0–15.0)
MCH: 27.6 pg (ref 26.0–34.0)
MCHC: 32 g/dL (ref 30.0–36.0)
MCV: 86.1 fL (ref 78.0–100.0)
Platelets: 193 10*3/uL (ref 150–400)
RBC: 3.3 MIL/uL — ABNORMAL LOW (ref 3.87–5.11)
RDW: 13 % (ref 11.5–15.5)
WBC: 4 10*3/uL (ref 4.0–10.5)

## 2017-03-12 LAB — HIV ANTIBODY (ROUTINE TESTING W REFLEX): HIV Screen 4th Generation wRfx: NONREACTIVE

## 2017-03-12 LAB — GLUCOSE, CAPILLARY
Glucose-Capillary: 154 mg/dL — ABNORMAL HIGH (ref 65–99)
Glucose-Capillary: 166 mg/dL — ABNORMAL HIGH (ref 65–99)
Glucose-Capillary: 114 mg/dL — ABNORMAL HIGH (ref 65–99)
Glucose-Capillary: 140 mg/dL — ABNORMAL HIGH (ref 65–99)

## 2017-03-12 MED ORDER — IBUPROFEN 400 MG PO TABS
400.0000 mg | ORAL_TABLET | Freq: Once | ORAL | Status: AC
  Administered 2017-03-12: 400 mg via ORAL
  Filled 2017-03-12: qty 1

## 2017-03-12 NOTE — Care Management Obs Status (Signed)
[place] NOTIFICATION   Patient Details  Name: Tricia Huynh MRN: [id number] Date of Birth: 21-Sep-1956   Medicare Observation Status Notification Given:  Yes    Sharin Mons, RN 03/12/2017, 3:51 PM

## 2017-03-12 NOTE — Care Management Note (Signed)
Case Management Note  Patient Details  Name: Tricia Huynh MRN: [id number] Date of Birth: Mar 18, 1956  Subjective/Objective:       Admitted with HCAP,history of Hypertension, diabetes mellitus type 2, CKD stage 3, seizure disorder, impaired memory, who presents with HCAP and respiratory distress w/ marked tachypnea. From home with family. Independent with ADL's pta and no DME uusage.    Colette Ribas ([place])     [phone]       PCP: Jonathon Jordan  Action/Plan: Return to home when medically stable. CM to f/u with disposition needs.  Expected Discharge Date:                  Expected Discharge Plan:  Home/Self Care (resides with grandkids)  In-House Referral:     Discharge planning Services  CM Consult  Post Acute Care Choice:    Choice offered to:     DME Arranged:    DME Agency:     HH Arranged:    HH Agency:     Status of Service:  In process, will continue to follow  If discussed at Long Length of Stay Meetings, dates discussed:    Additional Comments:  Sharin Mons, RN 03/12/2017, 3:51 PM

## 2017-03-12 NOTE — Progress Notes (Signed)
PROGRESS NOTE    Tricia Huynh  FOY:[id number] DOB: Dec 06, 1955 DOA: 03/11/2017 PCP: Lilian Coma, MD    Brief Narrative: Tricia Huynh is a 61 y.o.female with prior history of Hypertension, diabetes mellitus type 2, CKD stage 3, seizure disorder, impaired memory,  who presents with HCAP and respiratory distress w/ marked tachypnea.   Assessment & Plan:   Principal Problem:   HCAP (healthcare-associated pneumonia) Active Problems:   Seizures ([place])   CKD (chronic kidney disease) stage 3, GFR 30-59 ml/min   Benign essential HTN   Diabetes ([place])   Dyslipidemia  Health care associated pneumonia:  Admitted for IV antibiotics, with IV vancomycin and zosyn, resume cough meds, neb treatments as needed. Pt significantly tachypnea, and sob. HIV non reactive.  Strep pneumo urinary antigen  Negative.  Sputum cultures ordered and pending.    Diabetes Mellitus:  CBG (last 3)   Recent Labs  03/11/17 1729 03/11/17 2112 03/12/17 0752  GLUCAP 121* 152* 154*    Resume SSI.   Hypertension: controlled.  Resume home meds.    Stage 3 CKD:  Creatinine at baseline.   Seizures;  Resume keppra.   Mild normocytic anemia:  Stable hemoglobin around 9.1.      DVT prophylaxis: heparin  Code Status: full code.  Family Communication: none at bedside,  Disposition Plan: pending further eval.    Consultants:   None.    Procedures: none.   Antimicrobials: IV vancomycin and IV zosyn since admission.   Subjective: Reports sob and not able to complete sentences while talking from sob.   Objective: Vitals:   03/11/17 2114 03/12/17 0455 03/12/17 0600 03/12/17 0915  BP: 134/81 131/81  113/64  Pulse: 95 (!) 101    Resp: 16 16    Temp: 98.8 F (37.1 C) (!) 103.1 F (39.5 C) 98.9 F (37.2 C)   TempSrc: Oral Oral Oral   SpO2: 100% 99%    Weight:      Height:        Intake/Output Summary (Last 24 hours) at 03/12/17 1059 Last data filed at 03/12/17  0958  Gross per 24 hour  Intake            987.5 ml  Output                3 ml  Net            984.5 ml   Filed Weights   03/11/17 0731 03/11/17 0735  Weight: 69.4 kg (153 lb) 69.4 kg (153 lb)    Examination:  General exam: Appears calm and comfortable  Respiratory system: scattered rhonchi, and wheezing .  Cardiovascular system: S1 & S2 heard, RRR. No JVD, No pedal edema. Gastrointestinal system: Abdomen is nondistended, soft and nontender. No organomegaly or masses felt. Normal bowel sounds heard. Central nervous system: Alert and oriented. No focal neurological deficits. Extremities: Symmetric 5 x 5 power. Skin: No rashes, lesions or ulcers     Data Reviewed: I have personally reviewed following labs and imaging studies  CBC:  Recent Labs Lab 03/11/17 0754 03/11/17 1441 03/12/17 0605  WBC 4.4 3.5* 4.0  NEUTROABS 2.6  --   --   HGB 10.2* 9.4* 9.1*  HCT 30.4* 29.2* 28.4*  MCV 86.1 86.6 86.1  PLT 195 179 767   Basic Metabolic Panel:  Recent Labs Lab 03/11/17 0754 03/11/17 1441 03/12/17 0605  NA 136  --  137  K 3.5  --  3.8  CL 101  --  105  CO2 19*  --  22  GLUCOSE 173*  --  175*  BUN 12  --  14  CREATININE 1.28* 1.19* 1.28*  CALCIUM 8.9  --  8.5*   GFR: Estimated Creatinine Clearance: 45.5 mL/min (A) (by C-G formula based on SCr of 1.28 mg/dL (H)). Liver Function Tests: No results for input(s): AST, ALT, ALKPHOS, BILITOT, PROT, ALBUMIN in the last 168 hours. No results for input(s): LIPASE, AMYLASE in the last 168 hours. No results for input(s): AMMONIA in the last 168 hours. Coagulation Profile: No results for input(s): INR, PROTIME in the last 168 hours. Cardiac Enzymes: No results for input(s): CKTOTAL, CKMB, CKMBINDEX, TROPONINI in the last 168 hours. BNP (last 3 results) No results for input(s): PROBNP in the last 8760 hours. HbA1C: No results for input(s): HGBA1C in the last 72 hours. CBG:  Recent Labs Lab 03/11/17 1339 03/11/17 1729  03/11/17 2112 03/12/17 0752  GLUCAP 150* 121* 152* 154*   Lipid Profile: No results for input(s): CHOL, HDL, LDLCALC, TRIG, CHOLHDL, LDLDIRECT in the last 72 hours. Thyroid Function Tests: No results for input(s): TSH, T4TOTAL, FREET4, T3FREE, THYROIDAB in the last 72 hours. Anemia Panel: No results for input(s): VITAMINB12, FOLATE, FERRITIN, TIBC, IRON, RETICCTPCT in the last 72 hours. Sepsis Labs:  Recent Labs Lab 03/11/17 0808  LATICACIDVEN 0.90    Recent Results (from the past 240 hour(s))  Culture, sputum-assessment     Status: None   Collection Time: 03/11/17  7:26 PM  Result Value Ref Range Status   Specimen Description EXPECTORATED SPUTUM  Final   Special Requests NONE  Final   Sputum evaluation   Final    Sputum specimen not acceptable for testing.  Please recollect.   Dorena Bodo RN [id number] 03/11/17 A BROWNING    Report Status 03/11/2017 FINAL  Final         Radiology Studies: Dg Chest 2 View  Result Date: 03/11/2017 CLINICAL DATA:  Cough and congestion.  Hypertension EXAM: CHEST  2 VIEW COMPARISON:  December 30, 2016 FINDINGS: There is patchy infiltrate in the right middle lobe with mild volume loss. There is slight atelectasis in the left base. The lungs elsewhere clear. Heart size and pulmonary vascularity are normal. No adenopathy. There is aortic atherosclerosis. No bone lesions. IMPRESSION: Patchy infiltrate consistent with pneumonia in the right middle lobe. Mild left base atelectasis. Lungs elsewhere clear. There is aortic atherosclerosis. Followup PA and lateral chest radiographs recommended in 3-4 weeks following trial of antibiotic therapy to ensure resolution and exclude underlying malignancy. Electronically Signed   By: Lowella Grip III M.D.   On: 03/11/2017 08:12        Scheduled Meds: . amitriptyline  50 mg Oral QHS  . amLODipine  5 mg Oral Daily   And  . irbesartan  300 mg Oral Daily  . atorvastatin  40 mg Oral Daily  . gabapentin  600 mg  Oral BID  . guaiFENesin  600 mg Oral BID  . heparin  5,000 Units Subcutaneous Q8H  . insulin aspart  0-9 Units Subcutaneous TID WC  . insulin glargine  30 Units Subcutaneous Daily  . levETIRAcetam  750 mg Oral BID  . metoprolol succinate  50 mg Oral QHS  . sodium chloride flush  3 mL Intravenous Q12H   Continuous Infusions: . piperacillin-tazobactam (ZOSYN)  IV Stopped (03/12/17 1038)  . vancomycin Stopped (03/12/17 1015)     LOS: 0 days    Time spent: 35 minutes.  Hosie Poisson, MD [facility] Pager [phone]   If 7PM-7AM, please contact night-coverage [email or web address] Password [facility] 03/12/2017, 10:59 AM

## 2017-03-13 DIAGNOSIS — E1042 Type 1 diabetes mellitus with diabetic polyneuropathy: Secondary | ICD-10-CM

## 2017-03-13 LAB — EXPECTORATED SPUTUM ASSESSMENT W GRAM STAIN, RFLX TO RESP C

## 2017-03-13 LAB — GLUCOSE, CAPILLARY
Glucose-Capillary: 136 mg/dL — ABNORMAL HIGH (ref 65–99)
Glucose-Capillary: 136 mg/dL — ABNORMAL HIGH (ref 65–99)
Glucose-Capillary: 154 mg/dL — ABNORMAL HIGH (ref 65–99)
Glucose-Capillary: 82 mg/dL (ref 65–99)
Glucose-Capillary: 156 mg/dL — ABNORMAL HIGH (ref 65–99)

## 2017-03-13 MED ORDER — BENZONATATE 100 MG PO CAPS
200.0000 mg | ORAL_CAPSULE | Freq: Three times a day (TID) | ORAL | Status: DC | PRN
  Administered 2017-03-13: 200 mg via ORAL
  Filled 2017-03-13: qty 2

## 2017-03-13 MED ORDER — GUAIFENESIN-DM 100-10 MG/5ML PO SYRP: 5.0000 mL | ORAL_SOLUTION | ORAL | Status: DC | PRN

## 2017-03-13 MED ORDER — SALINE SPRAY 0.65 % NA SOLN
2.0000 | NASAL | Status: DC | PRN
  Administered 2017-03-13: 2 via NASAL
  Filled 2017-03-13: qty 44

## 2017-03-13 NOTE — Progress Notes (Signed)
PROGRESS NOTE    Tricia Huynh  GMW:[id number] DOB: 1956-05-15 DOA: 03/11/2017 PCP: Tricia Coma, MD   Brief Narrative: Tricia Huynh a 61 y.o.female with prior history of Hypertension, diabetes mellitus type 2, CKD stage 3, seizure disorder, impaired memory, who presents with HCAP and respiratory distress w/ marked tachypnea.   Assessment & Plan:   Principal Problem:   HCAP (healthcare-associated pneumonia) Active Problems:   Seizures (Tricia Huynh)   CKD (chronic kidney disease) stage 3, GFR 30-59 ml/min   Benign essential HTN   Diabetes (Tricia Huynh)   Dyslipidemia   Health care associated pneumonia:  Admitted for IV antibiotics, with IV vancomycin and zosyn, resume cough meds, neb treatments as needed. Pt significantly tachypnea, and sob and coughing more today, tessalon and trobitussin added. She will probably need another 24 hours of antibiotics. We were able to wean her off the oxygen.  HIV non reactive.  Strep pneumo urinary antigen  Negative.  Sputum cultures ordered and pending.    Diabetes Mellitus:  CBG (last 3)   Recent Labs  03/13/17 0510 03/13/17 0822 03/13/17 1121  GLUCAP 136* 136* 154*       Resume SSI.   Hypertension: controlled.  Resume home meds.    Stage 3 CKD:  Creatinine at baseline.   Seizures;  Resume keppra.   Mild normocytic anemia:  Stable hemoglobin around 9.1.     DVT prophylaxis: lovenox.  Code Status: full code.  Family Communication: none at bedside.  Disposition Plan: pending further eval. Hopefully tomorrow. When her cough and breathing improves.    Consultants:   none   Procedures: none   Antimicrobials: vancomycin and zosyn.   Subjective: Coughing a lot, clutching her chest .   Objective: Vitals:   03/12/17 1722 03/12/17 2148 03/13/17 0511 03/13/17 1106  BP:  113/81 107/70   Pulse:  87 75   Resp:  18 18   Temp: 99.4 F (37.4 C) 98.5 F (36.9 C) 97.8 F (36.6 C)   TempSrc:  Oral     SpO2:  95% 98% 96%  Weight:      Height:        Intake/Output Summary (Last 24 hours) at 03/13/17 1529 Last data filed at 03/12/17 1849  Gross per 24 hour  Intake              236 ml  Output                0 ml  Net              236 ml   Filed Weights   03/11/17 0731 03/11/17 0735  Weight: 69.4 kg (153 lb) 69.4 kg (153 lb)    Examination:  General exam: Appears in mild distress from coughing.  Respiratory system: scattered rhonchi on the right.  Cardiovascular system: S1 & S2 heard, RRR. No JVD, murmurs, rubs, gallops or clicks. No pedal edema. Gastrointestinal system: Abdomen is nondistended, soft and nontender. No organomegaly or masses felt. Normal bowel sounds heard. Central nervous system: Alert and oriented. No focal neurological deficits. Extremities: Symmetric 5 x 5 power. Skin: No rashes, lesions or ulcers     Data Reviewed: I have personally reviewed following labs and imaging studies  CBC:  Recent Labs Lab 03/11/17 0754 03/11/17 1441 03/12/17 0605  WBC 4.4 3.5* 4.0  NEUTROABS 2.6  --   --   HGB 10.2* 9.4* 9.1*  HCT 30.4* 29.2* 28.4*  MCV 86.1 86.6 86.1  PLT 195 179 193  Basic Metabolic Panel:  Recent Labs Lab 03/11/17 0754 03/11/17 1441 03/12/17 0605  NA 136  --  137  K 3.5  --  3.8  CL 101  --  105  CO2 19*  --  22  GLUCOSE 173*  --  175*  BUN 12  --  14  CREATININE 1.28* 1.19* 1.28*  CALCIUM 8.9  --  8.5*   GFR: Estimated Creatinine Clearance: 45.5 mL/min (A) (by C-G formula based on SCr of 1.28 mg/dL (H)). Liver Function Tests: No results for input(s): AST, ALT, ALKPHOS, BILITOT, PROT, ALBUMIN in the last 168 hours. No results for input(s): LIPASE, AMYLASE in the last 168 hours. No results for input(s): AMMONIA in the last 168 hours. Coagulation Profile: No results for input(s): INR, PROTIME in the last 168 hours. Cardiac Enzymes: No results for input(s): CKTOTAL, CKMB, CKMBINDEX, TROPONINI in the last 168 hours. BNP  (last 3 results) No results for input(s): PROBNP in the last 8760 hours. HbA1C: No results for input(s): HGBA1C in the last 72 hours. CBG:  Recent Labs Lab 03/12/17 1653 03/12/17 2147 03/13/17 0510 03/13/17 0822 03/13/17 1121  GLUCAP 114* 140* 136* 136* 154*   Lipid Profile: No results for input(s): CHOL, HDL, LDLCALC, TRIG, CHOLHDL, LDLDIRECT in the last 72 hours. Thyroid Function Tests: No results for input(s): TSH, T4TOTAL, FREET4, T3FREE, THYROIDAB in the last 72 hours. Anemia Panel: No results for input(s): VITAMINB12, FOLATE, FERRITIN, TIBC, IRON, RETICCTPCT in the last 72 hours. Sepsis Labs:  Recent Labs Lab 03/11/17 0808  LATICACIDVEN 0.90    Recent Results (from the past 240 hour(s))  Blood culture (routine x 2)     Status: None (Preliminary result)   Collection Time: 03/11/17  7:54 AM  Result Value Ref Range Status   Specimen Description BLOOD RIGHT ANTECUBITAL  Final   Special Requests   Final    BOTTLES DRAWN AEROBIC AND ANAEROBIC Blood Culture adequate volume   Culture NO GROWTH 2 DAYS  Final   Report Status PENDING  Incomplete  Blood culture (routine x 2)     Status: None (Preliminary result)   Collection Time: 03/11/17  9:25 AM  Result Value Ref Range Status   Specimen Description BLOOD LEFT ANTECUBITAL  Final   Special Requests   Final    BOTTLES DRAWN AEROBIC AND ANAEROBIC Blood Culture adequate volume   Culture NO GROWTH 2 DAYS  Final   Report Status PENDING  Incomplete  Culture, blood (routine x 2) Call MD if unable to obtain prior to antibiotics being given     Status: None (Preliminary result)   Collection Time: 03/11/17  2:50 PM  Result Value Ref Range Status   Specimen Description BLOOD LEFT ANTECUBITAL  Final   Special Requests   Final    BOTTLES DRAWN AEROBIC AND ANAEROBIC Blood Culture adequate volume   Culture NO GROWTH 2 DAYS  Final   Report Status PENDING  Incomplete  Culture, blood (routine x 2) Call MD if unable to obtain prior to  antibiotics being given     Status: None (Preliminary result)   Collection Time: 03/11/17  3:00 PM  Result Value Ref Range Status   Specimen Description BLOOD LEFT HAND  Final   Special Requests   Final    BOTTLES DRAWN AEROBIC ONLY Blood Culture adequate volume   Culture NO GROWTH 2 DAYS  Final   Report Status PENDING  Incomplete  Culture, sputum-assessment     Status: None   Collection Time: 03/11/17  7:26 PM  Result Value Ref Range Status   Specimen Description EXPECTORATED SPUTUM  Final   Special Requests NONE  Final   Sputum evaluation   Final    Sputum specimen not acceptable for testing.  Please recollect.   Dorena Bodo RN [id number] 03/11/17 A BROWNING    Report Status 03/11/2017 FINAL  Final  Culture, expectorated sputum-assessment     Status: None   Collection Time: 03/11/17 11:31 PM  Result Value Ref Range Status   Specimen Description EXPECTORATED SPUTUM  Final   Special Requests NONE  Final   Sputum evaluation THIS SPECIMEN IS ACCEPTABLE FOR SPUTUM CULTURE  Final   Report Status 03/13/2017 FINAL  Final  Culture, respiratory (NON-Expectorated)     Status: None (Preliminary result)   Collection Time: 03/11/17 11:31 PM  Result Value Ref Range Status   Specimen Description EXPECTORATED SPUTUM  Final   Special Requests NONE Reflexed from [id number]  Final   Gram Stain   Final    ABUNDANT WBC PRESENT,BOTH PMN AND MONONUCLEAR MODERATE GRAM POSITIVE COCCI IN PAIRS MODERATE GRAM NEGATIVE RODS FEW GRAM NEGATIVE COCCI IN PAIRS    Culture TOO YOUNG TO READ  Final   Report Status PENDING  Incomplete         Radiology Studies: No results found.      Scheduled Meds: . amitriptyline  50 mg Oral QHS  . amLODipine  5 mg Oral Daily   And  . irbesartan  300 mg Oral Daily  . atorvastatin  40 mg Oral Daily  . gabapentin  600 mg Oral BID  . guaiFENesin  600 mg Oral BID  . heparin  5,000 Units Subcutaneous Q8H  . insulin aspart  0-9 Units Subcutaneous TID WC  . insulin glargine   30 Units Subcutaneous Daily  . levETIRAcetam  750 mg Oral BID  . metoprolol succinate  50 mg Oral QHS  . sodium chloride flush  3 mL Intravenous Q12H   Continuous Infusions: . piperacillin-tazobactam (ZOSYN)  IV 3.375 g (03/13/17 1523)  . vancomycin Stopped (03/13/17 1058)     LOS: 1 day    Time spent: 35 minutes.     Hosie Poisson, MD [facility] Pager [phone]  If 7PM-7AM, please contact night-coverage [email or web address] Password [facility] 03/13/2017, 3:29 PM

## 2017-03-13 NOTE — Evaluation (Signed)
Physical Therapy Evaluation Patient Details Name: Tricia Huynh MRN: [id number] DOB: July 28, 1956 Today's Date: 03/13/2017   History of Present Illness  Pt adm with HCAP. PMH - DM, HTN, seizures, CKD  Clinical Impression  Pt doing well with mobility and no further PT needed.  Ready for dc from PT standpoint.      Follow Up Recommendations No PT follow up    Equipment Recommendations  None recommended by PT    Recommendations for Other Services       Precautions / Restrictions Precautions Precautions: None      Mobility  Bed Mobility Overal bed mobility: Modified Independent                Transfers Overall transfer level: Modified independent Equipment used: None                Ambulation/Gait Ambulation/Gait assistance: Modified independent (Device/Increase time) Ambulation Distance (Feet): 350 Feet Assistive device: None Gait Pattern/deviations: Step-through pattern;Decreased stride length Gait velocity: decr Gait velocity interpretation: Below normal speed for age/gender General Gait Details: Steady gait. SpO2 96% on RA with amb.  Stairs            Wheelchair Mobility    Modified Rankin (Stroke Patients Only)       Balance Overall balance assessment: Modified Independent                                           Pertinent Vitals/Pain Pain Assessment: No/denies pain    Home Living Family/patient expects to be discharged to:: Private residence Living Arrangements: Other relatives (granddaughters) Available Help at Discharge: Family;Available PRN/intermittently Type of Home: House Home Access: Stairs to enter Entrance Stairs-Rails: None Entrance Stairs-Number of Steps: 3 Home Layout: One level Home Equipment: None      Prior Function Level of Independence: Independent               Hand Dominance   Dominant Hand: Right    Extremity/Trunk Assessment   Upper Extremity Assessment Upper  Extremity Assessment: Overall WFL for tasks assessed    Lower Extremity Assessment Lower Extremity Assessment: Overall WFL for tasks assessed       Communication   Communication: No difficulties  Cognition Arousal/Alertness: Awake/alert Behavior During Therapy: Flat affect Overall Cognitive Status: Within Functional Limits for tasks assessed                                        General Comments      Exercises     Assessment/Plan    PT Assessment Patent does not need any further PT services  PT Problem List         PT Treatment Interventions      PT Goals (Current goals can be found in the Care Plan section)  Acute Rehab PT Goals PT Goal Formulation: All assessment and education complete, DC therapy    Frequency     Barriers to discharge        Co-evaluation               End of Session   Activity Tolerance: Patient tolerated treatment well Patient left: in chair;with call bell/phone within reach Nurse Communication: Mobility status;Other (comment) (Left O2 off of pt) PT Visit Diagnosis: Unsteadiness on feet (R26.81)  Time: 4259-5638 PT Time Calculation (min) (ACUTE ONLY): 22 min   Charges:   PT Evaluation $PT Eval Low Complexity: 1 Procedure     PT G CodesMarland Kitchen        [facility] PT [place] 03/13/2017, 11:06 AM

## 2017-03-13 NOTE — Progress Notes (Signed)
SATURATION QUALIFICATIONS: (This note is used to comply with regulatory documentation for home oxygen)  Patient Saturations on Room Air at Rest = 98%  Patient Saturations on Room Air while Ambulating = 96%  Patient Saturations on N/A Liters of oxygen while Ambulating = N/A%  Please briefly explain why patient needs home oxygen:Pt does not need home oxygen.  [employer] PT [phone]

## 2017-03-14 LAB — CBC
HCT: 28.7 % — ABNORMAL LOW (ref 36.0–46.0)
Hemoglobin: 9.3 g/dL — ABNORMAL LOW (ref 12.0–15.0)
MCH: 28.2 pg (ref 26.0–34.0)
MCHC: 32.4 g/dL (ref 30.0–36.0)
MCV: 87 fL (ref 78.0–100.0)
Platelets: 191 10*3/uL (ref 150–400)
RBC: 3.3 MIL/uL — ABNORMAL LOW (ref 3.87–5.11)
RDW: 13.2 % (ref 11.5–15.5)
WBC: 2.9 10*3/uL — ABNORMAL LOW (ref 4.0–10.5)

## 2017-03-14 LAB — BASIC METABOLIC PANEL
Anion gap: 11 (ref 5–15)
BUN: 13 mg/dL (ref 6–20)
CO2: 24 mmol/L (ref 22–32)
Calcium: 8.6 mg/dL — ABNORMAL LOW (ref 8.9–10.3)
Chloride: 105 mmol/L (ref 101–111)
Creatinine, Ser: 1.31 mg/dL — ABNORMAL HIGH (ref 0.44–1.00)
GFR calc Af Amer: 50 mL/min — ABNORMAL LOW (ref >60)
GFR calc non Af Amer: 43 mL/min — ABNORMAL LOW (ref >60)
Glucose, Bld: 154 mg/dL — ABNORMAL HIGH (ref 65–99)
Potassium: 3.8 mmol/L (ref 3.5–5.1)
Sodium: 140 mmol/L (ref 135–145)

## 2017-03-14 LAB — GLUCOSE, CAPILLARY
Glucose-Capillary: 124 mg/dL — ABNORMAL HIGH (ref 65–99)
Glucose-Capillary: 97 mg/dL (ref 65–99)
Glucose-Capillary: 80 mg/dL (ref 65–99)

## 2017-03-14 NOTE — Progress Notes (Signed)
PROGRESS NOTE    Tricia Huynh  WVP:[id number] DOB: 1956-02-18 DOA: 03/11/2017 PCP: Lilian Coma, MD   Brief Narrative: Tricia Huynh a 61 y.o.female with prior history of Hypertension, diabetes mellitus type 2, CKD stage 3, seizure disorder, impaired memory, who presents with HCAP and respiratory distress w/ marked tachypnea.   Assessment & Plan:   Principal Problem:   HCAP (healthcare-associated pneumonia) Active Problems:   Seizures ([place])   CKD (chronic kidney disease) stage 3, GFR 30-59 ml/min   Benign essential HTN   Diabetes ([place])   Dyslipidemia   Health care associated pneumonia:  Admitted for IV antibiotics, with IV vancomycin and zosyn, resume cough meds, neb treatments as needed. Pt tachypnea, sob and cough improved, but she had a fever of 102 yesterday evening. Blood cultures from 4/25 have been negative so far. Repeat blood cultures have not been ordered.  D/C vancomycin and get SLP evaluation to see if she is continuously aspirating, which would explain the recurrent fevers on antibiotics.  HIV non reactive.  Strep pneumo urinary antigen  Negative.  Sputum cultures ordered and pending.   inaddition to the above, patient's oxygen saturation's have dropped to low 80's last night during sleep. She was put on 2 lit of [place] oxygen and will probably need oxygen on discharge.    Leukopenia: unclear etiology.  HIV antibody is negative. Continue to monitor.      Diabetes Mellitus:  CBG (last 3)   Recent Labs  03/13/17 1638 03/13/17 2132 03/14/17 1242  GLUCAP 82 156* 124*       Resume SSI.   Hypertension: controlled.  Resume home meds.    Stage 3 CKD:  Creatinine at baseline.   Seizures;  Resume keppra.   Mild normocytic anemia:  Stable hemoglobin around 9.1.     DVT prophylaxis: lovenox.  Code Status: full code.  Family Communication: none at bedside.  Disposition Plan: pending further eval. Hopefully  tomorrow. When her cough and breathing improves.    Consultants:   none   Procedures: none   Antimicrobials: vancomycin and zosyn.   Subjective: Back on [place] oxygen, sob and cough improved but not back to baseline.   Objective: Vitals:   03/13/17 1631 03/13/17 1829 03/13/17 2133 03/14/17 0548  BP:   106/65 116/77  Pulse:   88 84  Resp:   18 18  Temp:  98.9 F (37.2 C) 98.9 F (37.2 C) 99.1 F (37.3 C)  TempSrc:  Oral    SpO2: 99%  95% 98%  Weight:      Height:        Intake/Output Summary (Last 24 hours) at 03/14/17 1302 Last data filed at 03/14/17 0630  Gross per 24 hour  Intake              560 ml  Output              700 ml  Net             -140 ml   Filed Weights   03/11/17 0731 03/11/17 0735  Weight: 69.4 kg (153 lb) 69.4 kg (153 lb)    Examination:  General exam: Appears comfortable on [place] oxygen.  Respiratory system: scattered rhonchi on the right.  Cardiovascular system: S1 & S2 heard, RRR. No JVD, murmurs, rubs, gallops or clicks. No pedal edema. Gastrointestinal system: Abdomen is nondistended, soft and nontender. No organomegaly or masses felt. Normal bowel sounds heard. Central nervous system: Alert and oriented. No focal neurological  deficits. Extremities: Symmetric 5 x 5 power. Skin: No rashes, lesions or ulcers     Data Reviewed: I have personally reviewed following labs and imaging studies  CBC:  Recent Labs Lab 03/11/17 0754 03/11/17 1441 03/12/17 0605 03/14/17 0931  WBC 4.4 3.5* 4.0 2.9*  NEUTROABS 2.6  --   --   --   HGB 10.2* 9.4* 9.1* 9.3*  HCT 30.4* 29.2* 28.4* 28.7*  MCV 86.1 86.6 86.1 87.0  PLT 195 179 193 440   Basic Metabolic Panel:  Recent Labs Lab 03/11/17 0754 03/11/17 1441 03/12/17 0605 03/14/17 0931  NA 136  --  137 140  K 3.5  --  3.8 3.8  CL 101  --  105 105  CO2 19*  --  22 24  GLUCOSE 173*  --  175* 154*  BUN 12  --  14 13  CREATININE 1.28* 1.19* 1.28* 1.31*  CALCIUM 8.9  --  8.5* 8.6*    GFR: Estimated Creatinine Clearance: 44.4 mL/min (A) (by C-G formula based on SCr of 1.31 mg/dL (H)). Liver Function Tests: No results for input(s): AST, ALT, ALKPHOS, BILITOT, PROT, ALBUMIN in the last 168 hours. No results for input(s): LIPASE, AMYLASE in the last 168 hours. No results for input(s): AMMONIA in the last 168 hours. Coagulation Profile: No results for input(s): INR, PROTIME in the last 168 hours. Cardiac Enzymes: No results for input(s): CKTOTAL, CKMB, CKMBINDEX, TROPONINI in the last 168 hours. BNP (last 3 results) No results for input(s): PROBNP in the last 8760 hours. HbA1C: No results for input(s): HGBA1C in the last 72 hours. CBG:  Recent Labs Lab 03/13/17 0822 03/13/17 1121 03/13/17 1638 03/13/17 2132 03/14/17 1242  GLUCAP 136* 154* 82 156* 124*   Lipid Profile: No results for input(s): CHOL, HDL, LDLCALC, TRIG, CHOLHDL, LDLDIRECT in the last 72 hours. Thyroid Function Tests: No results for input(s): TSH, T4TOTAL, FREET4, T3FREE, THYROIDAB in the last 72 hours. Anemia Panel: No results for input(s): VITAMINB12, FOLATE, FERRITIN, TIBC, IRON, RETICCTPCT in the last 72 hours. Sepsis Labs:  Recent Labs Lab 03/11/17 0808  LATICACIDVEN 0.90    Recent Results (from the past 240 hour(s))  Blood culture (routine x 2)     Status: None (Preliminary result)   Collection Time: 03/11/17  7:54 AM  Result Value Ref Range Status   Specimen Description BLOOD RIGHT ANTECUBITAL  Final   Special Requests   Final    BOTTLES DRAWN AEROBIC AND ANAEROBIC Blood Culture adequate volume   Culture NO GROWTH 2 DAYS  Final   Report Status PENDING  Incomplete  Blood culture (routine x 2)     Status: None (Preliminary result)   Collection Time: 03/11/17  9:25 AM  Result Value Ref Range Status   Specimen Description BLOOD LEFT ANTECUBITAL  Final   Special Requests   Final    BOTTLES DRAWN AEROBIC AND ANAEROBIC Blood Culture adequate volume   Culture NO GROWTH 2 DAYS   Final   Report Status PENDING  Incomplete  Culture, blood (routine x 2) Call MD if unable to obtain prior to antibiotics being given     Status: None (Preliminary result)   Collection Time: 03/11/17  2:50 PM  Result Value Ref Range Status   Specimen Description BLOOD LEFT ANTECUBITAL  Final   Special Requests   Final    BOTTLES DRAWN AEROBIC AND ANAEROBIC Blood Culture adequate volume   Culture NO GROWTH 2 DAYS  Final   Report Status PENDING  Incomplete  Culture, blood (routine x 2) Call MD if unable to obtain prior to antibiotics being given     Status: None (Preliminary result)   Collection Time: 03/11/17  3:00 PM  Result Value Ref Range Status   Specimen Description BLOOD LEFT HAND  Final   Special Requests   Final    BOTTLES DRAWN AEROBIC ONLY Blood Culture adequate volume   Culture NO GROWTH 2 DAYS  Final   Report Status PENDING  Incomplete  Culture, sputum-assessment     Status: None   Collection Time: 03/11/17  7:26 PM  Result Value Ref Range Status   Specimen Description EXPECTORATED SPUTUM  Final   Special Requests NONE  Final   Sputum evaluation   Final    Sputum specimen not acceptable for testing.  Please recollect.   Dorena Bodo RN [id number] 03/11/17 A BROWNING    Report Status 03/11/2017 FINAL  Final  Culture, expectorated sputum-assessment     Status: None   Collection Time: 03/11/17 11:31 PM  Result Value Ref Range Status   Specimen Description EXPECTORATED SPUTUM  Final   Special Requests NONE  Final   Sputum evaluation THIS SPECIMEN IS ACCEPTABLE FOR SPUTUM CULTURE  Final   Report Status 03/13/2017 FINAL  Final  Culture, respiratory (NON-Expectorated)     Status: None (Preliminary result)   Collection Time: 03/11/17 11:31 PM  Result Value Ref Range Status   Specimen Description EXPECTORATED SPUTUM  Final   Special Requests NONE Reflexed from [id number]  Final   Gram Stain   Final    ABUNDANT WBC PRESENT,BOTH PMN AND MONONUCLEAR MODERATE GRAM POSITIVE COCCI IN  PAIRS MODERATE GRAM NEGATIVE RODS FEW GRAM NEGATIVE COCCI IN PAIRS    Culture CULTURE REINCUBATED FOR BETTER GROWTH  Final   Report Status PENDING  Incomplete         Radiology Studies: No results found.      Scheduled Meds: . amitriptyline  50 mg Oral QHS  . amLODipine  5 mg Oral Daily   And  . irbesartan  300 mg Oral Daily  . atorvastatin  40 mg Oral Daily  . gabapentin  600 mg Oral BID  . guaiFENesin  600 mg Oral BID  . heparin  5,000 Units Subcutaneous Q8H  . insulin aspart  0-9 Units Subcutaneous TID WC  . insulin glargine  30 Units Subcutaneous Daily  . levETIRAcetam  750 mg Oral BID  . metoprolol succinate  50 mg Oral QHS  . sodium chloride flush  3 mL Intravenous Q12H   Continuous Infusions: . piperacillin-tazobactam (ZOSYN)  IV Stopped (03/14/17 1005)     LOS: 2 days    Time spent: 35 minutes.     Hosie Poisson, MD [facility] Pager [phone]  If 7PM-7AM, please contact night-coverage [email or web address] Password [email or web address] 03/14/2017, 1:02 PM

## 2017-03-14 NOTE — Evaluation (Signed)
Clinical/Bedside Swallow Evaluation Patient Details  Name: Tricia Huynh MRN: [id number] Date of Birth: 1955/11/27  Today's Date: 03/14/2017 Time: SLP Start Time (ACUTE ONLY): 1600 SLP Stop Time (ACUTE ONLY): 1610 SLP Time Calculation (min) (ACUTE ONLY): 10 min  Past Medical History:  Past Medical History:  Diagnosis Date  . Diabetes mellitus type 2 in nonobese ([place]) 06/06/2015  . Diabetes mellitus without complication ([place])   . Diabetic neuropathy ([place]) 06/06/2015  . Dyslipidemia 06/06/2015  . Encephalopathy   . Essential hypertension 06/06/2015  . Hypertension   . Mood disorder ([place]) 06/06/2015  . Pneumonia 03/11/2017  . Seizure ([place])    sees [name]. abd eeg, on keppra   Past Surgical History:  Past Surgical History:  Procedure Laterality Date  . ABDOMINAL HYSTERECTOMY    . EYE SURGERY Bilateral   . TUBAL LIGATION     HPI:  Tricia Weekes Timothyis a 61 y.o.femalewith prior history of Hypertension, diabetes mellitus type 2, CKD stage 3,  seizure disorder noncompliant with Keppra, impaired memory, who presents with HCAP and respiratory distress w/ marked tachypnea. CXR 03/11/17 showed patchy infiltrate consistent with pneumonia in the right middle lobe. Seen during prior admission for bedside swallowing evaluation 06/06/15 with findings of functional oropharyngeal swallow with no focal CN deficits. At that time pt complaine of gagging with pills, SLP educated pt on alternatives for pill administration. Noted pt had lost dentures and was eating softer foods at home. Age appropriate solids, thin liquids recommended with no SLP follow-up. Referred for swallowing evaluation to r/o aspiration given persistent cough, fevers despite antibiotics.   Assessment / Plan / Recommendation Clinical Impression  Patient presents with oropharygneal swallow which appears grossly within functional limits given dentition. No overt signs of aspiration noted despite challenging with multiple consecutive  sips of thin liquids via straw in excess of 3oz. Pt reports she has only top dentures (bottom edentulous), her typical diet is "soft" foods. She reports she is able to eat soft baked meats, bread, crackers with extended time for oral preparation. Oral preparation, clearance of regular solid cracker was observed to be adequate. She endorses "foods feeling stuck" at level of thyroid and sternal notch, states foreign body sensation subsides with liquid wash. Educated pt re: symptoms of reflux, compensatory techniques should this aid symptoms, including thorough mastication, following solids with liquids. Given complaints consistent with reflux, pt remains at mild risk for aspiration, may benefit from esophageal assessment. Continue current diet (regular/thin liquids), may downgrade to softer foods (dys 3) if patient prefers. No further skilled ST needs identified at this time, SLP will s/o.  SLP Visit Diagnosis: Dysphagia, unspecified (R13.10)    Aspiration Risk  Mild aspiration risk    Diet Recommendation Thin liquid;Regular   Liquid Administration via: Straw;Cup Medication Administration: Whole meds with liquid Supervision: Patient able to self feed Compensations: Slow rate;Small sips/bites;Follow solids with liquid Postural Changes: Seated upright at 90 degrees;Remain upright for at least 30 minutes after po intake    Other  Recommendations Recommended Consults: Consider esophageal assessment;Consider GI evaluation Oral Care Recommendations: Oral care BID   Follow up Recommendations None      Frequency and Duration            Prognosis        Swallow Study   General Date of Onset: 03/11/17 HPI: Tricia Zarr Timothyis a 61 y.o.femalewith prior history of Hypertension, diabetes mellitus type 2, CKD stage 3,  seizure disorder noncompliant with Keppra, impaired memory, who presents with HCAP and  respiratory distress w/ marked tachypnea. CXR 03/11/17 showed patchy infiltrate consistent  with pneumonia in the right middle lobe. Seen during prior admission for bedside swallowing evaluation 06/06/15 with findings of functional oropharyngeal swallow with no focal CN deficits. At that time pt complaine of gagging with pills, SLP educated pt on alternatives for pill administration. Noted pt had lost dentures and was eating softer foods at home. Age appropriate solids, thin liquids recommended with no SLP follow-up. Referred for swallowing evaluation to r/o aspiration given persistent cough, fevers despite antibiotics. Type of Study: Bedside Swallow Evaluation Previous Swallow Assessment: see HPI Diet Prior to this Study: Regular;Thin liquids Temperature Spikes Noted: Yes Respiratory Status: Nasal cannula History of Recent Intubation: No Behavior/Cognition: Alert;Cooperative;Pleasant mood Oral Cavity Assessment: Within Functional Limits Oral Care Completed by SLP: No Oral Cavity - Dentition: Dentures, top;Missing dentition Vision: Functional for self-feeding Self-Feeding Abilities: Able to feed self Patient Positioning: Upright in chair Baseline Vocal Quality: Normal Volitional Cough: Strong Volitional Swallow: Able to elicit    Oral/Motor/Sensory Function Overall Oral Motor/Sensory Function: Within functional limits   Ice Chips Ice chips: Within functional limits Presentation: Spoon   Thin Liquid Thin Liquid: Within functional limits Presentation: Self Fed;Cup;Straw    Nectar Thick Nectar Thick Liquid: Not tested   Honey Thick Honey Thick Liquid: Not tested   Puree Puree: Within functional limits Presentation: Self Fed;Spoon   Solid   GO   Solid: Within functional limits Presentation: Self Fed    Functional Assessment Tool Used: skilled clinical judgment Functional Limitations: Swallowing Swallow Current Status (F3832): At least 1 percent but less than 20 percent impaired, limited or restricted Swallow Goal Status [phone]): At least 1 percent but less than 20 percent  impaired, limited or restricted Swallow Discharge Status [phone]): At least 1 percent but less than 20 percent impaired, limited or restricted   Tricia Huynh 03/14/2017,4:24 PM  Deneise Lever, [place] CF-SLP Speech-Language Pathologist [phone]

## 2017-03-15 LAB — GLUCOSE, CAPILLARY
Glucose-Capillary: 62 mg/dL — ABNORMAL LOW (ref 65–99)
Glucose-Capillary: 73 mg/dL (ref 65–99)
Glucose-Capillary: 102 mg/dL — ABNORMAL HIGH (ref 65–99)
Glucose-Capillary: 119 mg/dL — ABNORMAL HIGH (ref 65–99)
Glucose-Capillary: 146 mg/dL — ABNORMAL HIGH (ref 65–99)

## 2017-03-15 LAB — CULTURE, RESPIRATORY W GRAM STAIN: Culture: NORMAL

## 2017-03-15 MED ORDER — INSULIN GLARGINE 100 UNIT/ML ~~LOC~~ SOLN: 10.0000 [IU] | Freq: Every day | SUBCUTANEOUS | Status: DC

## 2017-03-15 NOTE — Progress Notes (Signed)
Notified MD about low CBG this am. Asking what to do with Lantus. Will change Lantus time and dose per order. Joslyn Hy, MSN, RN, [employer]

## 2017-03-15 NOTE — Progress Notes (Signed)
PROGRESS NOTE    Tricia Huynh  SEG:[id number] DOB: 1956-10-23 DOA: 03/11/2017 PCP: Lilian Coma, MD   Brief Narrative: Tricia Lenz Timothyis a 61 y.o.female with prior history of Hypertension, diabetes mellitus type 2, CKD stage 3, seizure disorder, impaired memory, who presents with HCAP and respiratory distress w/ marked tachypnea.   Assessment & Plan:   Principal Problem:   HCAP (healthcare-associated pneumonia) Active Problems:   Seizures ([place])   CKD (chronic kidney disease) stage 3, GFR 30-59 ml/min   Benign essential HTN   Diabetes ([place])   Dyslipidemia   Health care associated pneumonia:  Admitted for IV antibiotics, with IV vancomycin and zosyn, resume cough meds, neb treatments as needed. Pt tachypnea, sob and cough improved, Blood cultures from 4/25 have been negative so far. Repeat blood cultures have not been ordered.  D/C vancomycin and get SLP evaluation does not show any aspiration HIV non reactive.  Strep pneumo urinary antigen  Negative.  Sputum cultures ordered and pending.   inaddition to the above, patient's oxygen saturation's have dropped to low 80's last night during sleep. She was put on 2 lit of [place] oxygen and will probably need oxygen on discharge.    Leukopenia: unclear etiology.  HIV antibody is negative. Continue to monitor.      Diabetes Mellitus:  CBG (last 3)   Recent Labs  03/15/17 0742 03/15/17 0807 03/15/17 1243  GLUCAP 62* 73 102*     Low CBGS' this am, decreased the lantus to 10 units daily , [id number] ordered.  Pt became symptomatic with cbg of 60's. She reports her insulin was increased recently.   Hypertension: controlled.  Resume home meds.    Stage 3 CKD:  Creatinine at baseline.   Seizures;  Resume keppra.   Mild normocytic anemia:  Stable hemoglobin around 9.1.       DVT prophylaxis: lovenox.  Code Status: full code.  Family Communication: none at bedside.  Disposition Plan:  pending further eval. Hopefully tomorrow. When her cough and breathing improves.    Consultants:   none   Procedures: none   Antimicrobials: vancomycin and zosyn.   Subjective: Back on [place] oxygen, sob and cough improved but not back to baseline.   Objective: Vitals:   03/14/17 0548 03/14/17 1433 03/14/17 2118 03/15/17 0454  BP: 116/77 125/86 (!) 141/86 107/65  Pulse: 84 88 81 72  Resp: 18 18 18 18   Temp: 99.1 F (37.3 C) 98.2 F (36.8 C) 99 F (37.2 C) 97.7 F (36.5 C)  TempSrc:  Oral Oral Oral  SpO2: 98% 100% 97% 100%  Weight:      Height:        Intake/Output Summary (Last 24 hours) at 03/15/17 1500 Last data filed at 03/15/17 1307  Gross per 24 hour  Intake                0 ml  Output              900 ml  Net             -900 ml   Filed Weights   03/11/17 0731 03/11/17 0735  Weight: 69.4 kg (153 lb) 69.4 kg (153 lb)    Examination:  General exam: Appears comfortable on  oxygen.  Respiratory system: scattered rhonchi on the right.  Cardiovascular system: S1 & S2 heard, RRR. No JVD, murmurs, rubs, gallops or clicks. No pedal edema. Gastrointestinal system: Abdomen is nondistended, soft and nontender. No organomegaly or masses felt.  Normal bowel sounds heard. Central nervous system: Alert and oriented. No focal neurological deficits. Extremities: Symmetric 5 x 5 power. Skin: No rashes, lesions or ulcers     Data Reviewed: I have personally reviewed following labs and imaging studies  CBC:  Recent Labs Lab 03/11/17 0754 03/11/17 1441 03/12/17 0605 03/14/17 0931  WBC 4.4 3.5* 4.0 2.9*  NEUTROABS 2.6  --   --   --   HGB 10.2* 9.4* 9.1* 9.3*  HCT 30.4* 29.2* 28.4* 28.7*  MCV 86.1 86.6 86.1 87.0  PLT 195 179 193 017   Basic Metabolic Panel:  Recent Labs Lab 03/11/17 0754 03/11/17 1441 03/12/17 0605 03/14/17 0931  NA 136  --  137 140  K 3.5  --  3.8 3.8  CL 101  --  105 105  CO2 19*  --  22 24  GLUCOSE 173*  --  175* 154*  BUN 12  --   14 13  CREATININE 1.28* 1.19* 1.28* 1.31*  CALCIUM 8.9  --  8.5* 8.6*   GFR: Estimated Creatinine Clearance: 44.4 mL/min (A) (by C-G formula based on SCr of 1.31 mg/dL (H)). Liver Function Tests: No results for input(s): AST, ALT, ALKPHOS, BILITOT, PROT, ALBUMIN in the last 168 hours. No results for input(s): LIPASE, AMYLASE in the last 168 hours. No results for input(s): AMMONIA in the last 168 hours. Coagulation Profile: No results for input(s): INR, PROTIME in the last 168 hours. Cardiac Enzymes: No results for input(s): CKTOTAL, CKMB, CKMBINDEX, TROPONINI in the last 168 hours. BNP (last 3 results) No results for input(s): PROBNP in the last 8760 hours. HbA1C: No results for input(s): HGBA1C in the last 72 hours. CBG:  Recent Labs Lab 03/14/17 1704 03/14/17 2311 03/15/17 0742 03/15/17 0807 03/15/17 1243  GLUCAP 97 80 62* 73 102*   Lipid Profile: No results for input(s): CHOL, HDL, LDLCALC, TRIG, CHOLHDL, LDLDIRECT in the last 72 hours. Thyroid Function Tests: No results for input(s): TSH, T4TOTAL, FREET4, T3FREE, THYROIDAB in the last 72 hours. Anemia Panel: No results for input(s): VITAMINB12, FOLATE, FERRITIN, TIBC, IRON, RETICCTPCT in the last 72 hours. Sepsis Labs:  Recent Labs Lab 03/11/17 0808  LATICACIDVEN 0.90    Recent Results (from the past 240 hour(s))  Blood culture (routine x 2)     Status: None (Preliminary result)   Collection Time: 03/11/17  7:54 AM  Result Value Ref Range Status   Specimen Description BLOOD RIGHT ANTECUBITAL  Final   Special Requests   Final    BOTTLES DRAWN AEROBIC AND ANAEROBIC Blood Culture adequate volume   Culture NO GROWTH 4 DAYS  Final   Report Status PENDING  Incomplete  Blood culture (routine x 2)     Status: None (Preliminary result)   Collection Time: 03/11/17  9:25 AM  Result Value Ref Range Status   Specimen Description BLOOD LEFT ANTECUBITAL  Final   Special Requests   Final    BOTTLES DRAWN AEROBIC AND  ANAEROBIC Blood Culture adequate volume   Culture NO GROWTH 4 DAYS  Final   Report Status PENDING  Incomplete  Culture, blood (routine x 2) Call MD if unable to obtain prior to antibiotics being given     Status: None (Preliminary result)   Collection Time: 03/11/17  2:50 PM  Result Value Ref Range Status   Specimen Description BLOOD LEFT ANTECUBITAL  Final   Special Requests   Final    BOTTLES DRAWN AEROBIC AND ANAEROBIC Blood Culture adequate volume   Culture NO  GROWTH 4 DAYS  Final   Report Status PENDING  Incomplete  Culture, blood (routine x 2) Call MD if unable to obtain prior to antibiotics being given     Status: None (Preliminary result)   Collection Time: 03/11/17  3:00 PM  Result Value Ref Range Status   Specimen Description BLOOD LEFT HAND  Final   Special Requests   Final    BOTTLES DRAWN AEROBIC ONLY Blood Culture adequate volume   Culture NO GROWTH 4 DAYS  Final   Report Status PENDING  Incomplete  Culture, sputum-assessment     Status: None   Collection Time: 03/11/17  7:26 PM  Result Value Ref Range Status   Specimen Description EXPECTORATED SPUTUM  Final   Special Requests NONE  Final   Sputum evaluation   Final    Sputum specimen not acceptable for testing.  Please recollect.   Dorena Bodo RN [id number] 03/11/17 A BROWNING    Report Status 03/11/2017 FINAL  Final  Culture, expectorated sputum-assessment     Status: None   Collection Time: 03/11/17 11:31 PM  Result Value Ref Range Status   Specimen Description EXPECTORATED SPUTUM  Final   Special Requests NONE  Final   Sputum evaluation THIS SPECIMEN IS ACCEPTABLE FOR SPUTUM CULTURE  Final   Report Status 03/13/2017 FINAL  Final  Culture, respiratory (NON-Expectorated)     Status: None   Collection Time: 03/11/17 11:31 PM  Result Value Ref Range Status   Specimen Description EXPECTORATED SPUTUM  Final   Special Requests NONE Reflexed from [id number]  Final   Gram Stain   Final    ABUNDANT WBC PRESENT,BOTH PMN AND  MONONUCLEAR MODERATE GRAM POSITIVE COCCI IN PAIRS MODERATE GRAM NEGATIVE RODS FEW GRAM NEGATIVE COCCI IN PAIRS    Culture Consistent with normal respiratory flora.  Final   Report Status 03/15/2017 FINAL  Final         Radiology Studies: No results found.      Scheduled Meds: . amitriptyline  50 mg Oral QHS  . amLODipine  5 mg Oral Daily   And  . irbesartan  300 mg Oral Daily  . atorvastatin  40 mg Oral Daily  . gabapentin  600 mg Oral BID  . guaiFENesin  600 mg Oral BID  . heparin  5,000 Units Subcutaneous Q8H  . insulin aspart  0-9 Units Subcutaneous TID WC  . [START ON 03/16/2017] insulin glargine  10 Units Subcutaneous QHS  . levETIRAcetam  750 mg Oral BID  . metoprolol succinate  50 mg Oral QHS  . sodium chloride flush  3 mL Intravenous Q12H   Continuous Infusions: . piperacillin-tazobactam (ZOSYN)  IV 3.375 g (03/15/17 1447)     LOS: 3 days    Time spent: 35 minutes.     Hosie Poisson, MD [facility] Pager [phone]  If 7PM-7AM, please contact night-coverage [email or web address] Password [email or web address] 03/15/2017, 3:00 PM

## 2017-03-15 NOTE — Progress Notes (Signed)
Hypoglycemic Event  CBG: 62  Treatment: 15 GM carbohydrate snack  Symptoms: None  Follow-up CBG: Time:807 CBG Result:73  Possible Reasons for Event: Unknown  Comments/MD notified:continue to monitor    Venetia Maxon, Marc Morgans Ray

## 2017-03-16 LAB — HEMOGLOBIN A1C
Hgb A1c MFr Bld: 7.9 % — ABNORMAL HIGH (ref 4.8–5.6)
Mean Plasma Glucose: 180 mg/dL

## 2017-03-16 LAB — CULTURE, BLOOD (ROUTINE X 2)
Culture: NO GROWTH
Culture: NO GROWTH
Culture: NO GROWTH
Culture: NO GROWTH
Special Requests: ADEQUATE
Special Requests: ADEQUATE
Special Requests: ADEQUATE
Special Requests: ADEQUATE

## 2017-03-16 LAB — GLUCOSE, CAPILLARY
Glucose-Capillary: 91 mg/dL (ref 65–99)
Glucose-Capillary: 82 mg/dL (ref 65–99)
Glucose-Capillary: 133 mg/dL — ABNORMAL HIGH (ref 65–99)

## 2017-03-16 MED ORDER — LEVOFLOXACIN 500 MG PO TABS: 500.0000 mg | ORAL_TABLET | Freq: Every day | ORAL | 0 refills | Status: DC

## 2017-03-16 MED ORDER — BENZONATATE 200 MG PO CAPS: 200.0000 mg | ORAL_CAPSULE | Freq: Three times a day (TID) | ORAL | 0 refills | Status: DC | PRN

## 2017-03-16 MED ORDER — INSULIN GLARGINE 100 UNIT/ML ~~LOC~~ SOLN: 10.0000 [IU] | Freq: Every day | SUBCUTANEOUS | 11 refills | Status: DC

## 2017-03-16 MED ORDER — GUAIFENESIN-DM 100-10 MG/5ML PO SYRP: 5.0000 mL | ORAL_SOLUTION | ORAL | 0 refills | Status: DC | PRN

## 2017-03-16 NOTE — Progress Notes (Signed)
Patient was discharged home by MD order; discharged instructions review and give to patient with care notes; IV DIC; patient will be escorted to the car by a volunteer via wheelchair.  

## 2017-03-16 NOTE — Care Management Important Message (Signed)
Important Message  Patient Details  Name: Tricia Huynh MRN: [id number] Date of Birth: 04-06-1956   Medicare Important Message Given:  Yes    Nathen May 03/16/2017, 11:48 PM

## 2017-03-16 NOTE — Progress Notes (Signed)
SATURATION QUALIFICATIONS: (This note is used to comply with regulatory documentation for home oxygen)  Patient Saturations on Room Air at Rest = 100%  Patient Saturations on Room Air while Ambulating = 96%  Patient Saturations on 1 Liters of oxygen while Ambulating = 100%  Please briefly explain why patient needs home oxygen:

## 2017-03-16 NOTE — Discharge Summary (Signed)
Physician Discharge Summary  Tricia Huynh RFF:[id number] DOB: 1956-09-28 DOA: 03/11/2017  PCP: Tricia Coma, MD  Admit date: 03/11/2017 Discharge date: 03/16/2017  Admitted From: Home.  Disposition: home   Recommendations for Outpatient Follow-up:  1. Follow up with PCP in 1-2 weeks 2. Please obtain BMP/CBC in one week 3. Please obtain CXR in 3 to4 weeks to evaluate for resolution of the pneumonia.  4. Please follow up with PCP regarding hgba1c , we decreased your lantus from 30 units to 10 units as you became hypoglycemic on 30 units.  5.  Discharge Condition:stable. CODE STATUS:full code.  Diet recommendation: Heart Healthy   Brief/Interim Summary:  Tricia Huynh Timothyis a 61 y.o.femalewith prior history of Hypertension, diabetes mellitus type 2, CKD stage 3, seizure disorder, impaired memory, who presents with HCAP and respiratory distress w/ marked tachypnea.  Discharge Diagnoses:  Principal Problem:   HCAP (healthcare-associated pneumonia) Active Problems:   Seizures ([place])   CKD (chronic kidney disease) stage 3, GFR 30-59 ml/min   Benign essential HTN   Diabetes ([place])   Dyslipidemia    Health care associated pneumonia:  Admitted for IV antibiotics, with IV vancomycin and zosyn, resume cough meds, neb treatments as needed. Pt tachypnea, sob and cough improved, Blood cultures from 4/25 have been negative so far. Repeat blood cultures have not been ordered.  D/C vancomycin and get SLP evaluation does not show any aspiration HIV non reactive.  Strep pneumo urinary antigen Negative.  Sputum cultures ordered and shows normal flora.   Leukopenia: unclear etiology.  HIV antibody is negative. Continue to monitor.      Diabetes Mellitus:  CBG (last 3)   Recent Labs  03/15/17 1709 03/15/17 2116 03/16/17 0759  GLUCAP 119* 146* 82         Low CBGS' yesterday on 30 units of lantus, decreased the lantus to 10 units daily , hgba1c  ordered. Which is pending on discharge.  Pt became symptomatic with cbg of 60's. She reports her insulin was increased recently.  So we will discharge her on 10 units of lantus. And recommend followup with PCP.   Hypertension: controlled.  Resume home meds.    Stage 3 CKD:  Creatinine at baseline.   Seizures;  Resume keppra.   Mild normocytic anemia:  Stable hemoglobin around 9.1.     Discharge Instructions  Discharge Instructions    Diet - low sodium heart healthy    Complete by:  As directed    Discharge instructions    Complete by:  As directed    Please follow up with PCP in one week, please get a CXR in 3 to 4 weeks to evaluate for resolution of pneumonia.     Allergies as of 03/16/2017      Reactions   Percocet [oxycodone-acetaminophen] Nausea And Vomiting, Other (See Comments)   Shaky/unsteady.   Vicodin [hydrocodone-acetaminophen] Nausea And Vomiting, Other (See Comments)   Shaky/unsteady.      Medication List    STOP taking these medications   LANTUS SOLOSTAR 100 UNIT/ML Solostar Pen Generic drug:  Insulin Glargine Replaced by:  insulin glargine 100 UNIT/ML injection     TAKE these medications   amitriptyline 50 MG tablet Commonly known as:  ELAVIL Take 50 mg by mouth at bedtime.   amLODipine-olmesartan 5-40 MG tablet Commonly known as:  AZOR Take 1 tablet by mouth daily.   atorvastatin 40 MG tablet Commonly known as:  LIPITOR Take 40 mg by mouth daily.   benzonatate 200 MG  capsule Commonly known as:  TESSALON Take 1 capsule (200 mg total) by mouth 3 (three) times daily as needed for cough.   gabapentin 600 MG tablet Commonly known as:  NEURONTIN Take 600 mg by mouth 2 (two) times daily.   guaiFENesin-dextromethorphan 100-10 MG/5ML syrup Commonly known as:  ROBITUSSIN DM Take 5 mLs by mouth every 4 (four) hours as needed for cough.   insulin glargine 100 UNIT/ML injection Commonly known as:  LANTUS Inject 0.1 mLs (10 Units total)  into the skin at bedtime. Replaces:  LANTUS SOLOSTAR 100 UNIT/ML Solostar Pen   levETIRAcetam 750 MG tablet Commonly known as:  KEPPRA Take 1 tablet by mouth twice daily.   levofloxacin 500 MG tablet Commonly known as:  LEVAQUIN Take 1 tablet (500 mg total) by mouth daily.   metoprolol succinate 25 MG 24 hr tablet Commonly known as:  TOPROL-XL Take 50 mg by mouth at bedtime.      Follow-up Information    Tricia Coma, MD. Schedule an appointment as soon as possible for a visit in 1 week(s).   Specialty:  Family Medicine Why:  post hospitalization follow up.  Contact information: [address] [place] [address] [phone]          Allergies  Allergen Reactions  . Percocet [Oxycodone-Acetaminophen] Nausea And Vomiting and Other (See Comments)    Shaky/unsteady.  . Vicodin [Hydrocodone-Acetaminophen] Nausea And Vomiting and Other (See Comments)    Shaky/unsteady.    Consultations: none  Procedures/Studies: Dg Chest 2 View  Result Date: 03/11/2017 CLINICAL DATA:  Cough and congestion.  Hypertension EXAM: CHEST  2 VIEW COMPARISON:  December 30, 2016 FINDINGS: There is patchy infiltrate in the right middle lobe with mild volume loss. There is slight atelectasis in the left base. The lungs elsewhere clear. Heart size and pulmonary vascularity are normal. No adenopathy. There is aortic atherosclerosis. No bone lesions. IMPRESSION: Patchy infiltrate consistent with pneumonia in the right middle lobe. Mild left base atelectasis. Lungs elsewhere clear. There is aortic atherosclerosis. Followup PA and lateral chest radiographs recommended in 3-4 weeks following trial of antibiotic therapy to ensure resolution and exclude underlying malignancy. Electronically Signed   By: Lowella Grip III M.D.   On: 03/11/2017 08:12       Subjective: No new complaints.   Discharge Exam: Vitals:   03/15/17 2117 03/16/17 0519  BP: 118/75 115/79  Pulse: 79  71  Resp: 16 16  Temp: 98.6 F (37 C) 97.4 F (36.3 C)   Vitals:   03/15/17 0454 03/15/17 1516 03/15/17 2117 03/16/17 0519  BP: 107/65 127/88 118/75 115/79  Pulse: 72 83 79 71  Resp: 18 18 16 16   Temp: 97.7 F (36.5 C) 98.3 F (36.8 C) 98.6 F (37 C) 97.4 F (36.3 C)  TempSrc: Oral Oral Oral Oral  SpO2: 100% 97% 97% 100%  Weight:      Height:        General: Pt is alert, awake, not in acute distress Cardiovascular: RRR, S1/S2 +, no rubs, no gallops Respiratory: CTA bilaterally, no wheezing, no rhonchi Abdominal: Soft, NT, ND, bowel sounds + Extremities: no edema, no cyanosis    The results of significant diagnostics from this hospitalization (including imaging, microbiology, ancillary and laboratory) are listed below for reference.     Microbiology: Recent Results (from the past 240 hour(s))  Blood culture (routine x 2)     Status: None (Preliminary result)   Collection Time: 03/11/17  7:54 AM  Result  Value Ref Range Status   Specimen Description BLOOD RIGHT ANTECUBITAL  Final   Special Requests   Final    BOTTLES DRAWN AEROBIC AND ANAEROBIC Blood Culture adequate volume   Culture NO GROWTH 4 DAYS  Final   Report Status PENDING  Incomplete  Blood culture (routine x 2)     Status: None (Preliminary result)   Collection Time: 03/11/17  9:25 AM  Result Value Ref Range Status   Specimen Description BLOOD LEFT ANTECUBITAL  Final   Special Requests   Final    BOTTLES DRAWN AEROBIC AND ANAEROBIC Blood Culture adequate volume   Culture NO GROWTH 4 DAYS  Final   Report Status PENDING  Incomplete  Culture, blood (routine x 2) Call MD if unable to obtain prior to antibiotics being given     Status: None (Preliminary result)   Collection Time: 03/11/17  2:50 PM  Result Value Ref Range Status   Specimen Description BLOOD LEFT ANTECUBITAL  Final   Special Requests   Final    BOTTLES DRAWN AEROBIC AND ANAEROBIC Blood Culture adequate volume   Culture NO GROWTH 4 DAYS  Final    Report Status PENDING  Incomplete  Culture, blood (routine x 2) Call MD if unable to obtain prior to antibiotics being given     Status: None (Preliminary result)   Collection Time: 03/11/17  3:00 PM  Result Value Ref Range Status   Specimen Description BLOOD LEFT HAND  Final   Special Requests   Final    BOTTLES DRAWN AEROBIC ONLY Blood Culture adequate volume   Culture NO GROWTH 4 DAYS  Final   Report Status PENDING  Incomplete  Culture, sputum-assessment     Status: None   Collection Time: 03/11/17  7:26 PM  Result Value Ref Range Status   Specimen Description EXPECTORATED SPUTUM  Final   Special Requests NONE  Final   Sputum evaluation   Final    Sputum specimen not acceptable for testing.  Please recollect.   Dorena Bodo RN [id number] 03/11/17 A BROWNING    Report Status 03/11/2017 FINAL  Final  Culture, expectorated sputum-assessment     Status: None   Collection Time: 03/11/17 11:31 PM  Result Value Ref Range Status   Specimen Description EXPECTORATED SPUTUM  Final   Special Requests NONE  Final   Sputum evaluation THIS SPECIMEN IS ACCEPTABLE FOR SPUTUM CULTURE  Final   Report Status 03/13/2017 FINAL  Final  Culture, respiratory (NON-Expectorated)     Status: None   Collection Time: 03/11/17 11:31 PM  Result Value Ref Range Status   Specimen Description EXPECTORATED SPUTUM  Final   Special Requests NONE Reflexed from [id number]  Final   Gram Stain   Final    ABUNDANT WBC PRESENT,BOTH PMN AND MONONUCLEAR MODERATE GRAM POSITIVE COCCI IN PAIRS MODERATE GRAM NEGATIVE RODS FEW GRAM NEGATIVE COCCI IN PAIRS    Culture Consistent with normal respiratory flora.  Final   Report Status 03/15/2017 FINAL  Final     Labs: BNP (last 3 results) No results for input(s): BNP in the last 8760 hours. Basic Metabolic Panel:  Recent Labs Lab 03/11/17 0754 03/11/17 1441 03/12/17 0605 03/14/17 0931  NA 136  --  137 140  K 3.5  --  3.8 3.8  CL 101  --  105 105  CO2 19*  --  22 24  GLUCOSE  173*  --  175* 154*  BUN 12  --  14 13  CREATININE 1.28* 1.19* 1.28*  1.31*  CALCIUM 8.9  --  8.5* 8.6*   Liver Function Tests: No results for input(s): AST, ALT, ALKPHOS, BILITOT, PROT, ALBUMIN in the last 168 hours. No results for input(s): LIPASE, AMYLASE in the last 168 hours. No results for input(s): AMMONIA in the last 168 hours. CBC:  Recent Labs Lab 03/11/17 0754 03/11/17 1441 03/12/17 0605 03/14/17 0931  WBC 4.4 3.5* 4.0 2.9*  NEUTROABS 2.6  --   --   --   HGB 10.2* 9.4* 9.1* 9.3*  HCT 30.4* 29.2* 28.4* 28.7*  MCV 86.1 86.6 86.1 87.0  PLT 195 179 193 191   Cardiac Enzymes: No results for input(s): CKTOTAL, CKMB, CKMBINDEX, TROPONINI in the last 168 hours. BNP: Invalid input(s): POCBNP CBG:  Recent Labs Lab 03/15/17 0807 03/15/17 1243 03/15/17 1709 03/15/17 2116 03/16/17 0759  GLUCAP 73 102* 119* 146* 82   D-Dimer No results for input(s): DDIMER in the last 72 hours. Hgb A1c No results for input(s): HGBA1C in the last 72 hours. Lipid Profile No results for input(s): CHOL, HDL, LDLCALC, TRIG, CHOLHDL, LDLDIRECT in the last 72 hours. Thyroid function studies No results for input(s): TSH, T4TOTAL, T3FREE, THYROIDAB in the last 72 hours.  Invalid input(s): FREET3 Anemia work up No results for input(s): VITAMINB12, FOLATE, FERRITIN, TIBC, IRON, RETICCTPCT in the last 72 hours. Urinalysis    Component Value Date/Time   COLORURINE STRAW (A) 11/14/2016 2110   APPEARANCEUR CLEAR 11/14/2016 2110   LABSPEC 1.021 11/14/2016 2110   PHURINE 5.0 11/14/2016 2110   GLUCOSEU >=500 (A) 11/14/2016 2110   HGBUR NEGATIVE 11/14/2016 2110   [place] NEGATIVE 11/14/2016 2110   KETONESUR 5 (A) 11/14/2016 2110   PROTEINUR NEGATIVE 11/14/2016 2110   UROBILINOGEN 0.2 09/18/2015 1327   NITRITE POSITIVE (A) 11/14/2016 2110   LEUKOCYTESUR TRACE (A) 11/14/2016 2110   Sepsis Labs Invalid input(s): PROCALCITONIN,  WBC,  LACTICIDVEN Microbiology Recent Results (from the  past 240 hour(s))  Blood culture (routine x 2)     Status: None (Preliminary result)   Collection Time: 03/11/17  7:54 AM  Result Value Ref Range Status   Specimen Description BLOOD RIGHT ANTECUBITAL  Final   Special Requests   Final    BOTTLES DRAWN AEROBIC AND ANAEROBIC Blood Culture adequate volume   Culture NO GROWTH 4 DAYS  Final   Report Status PENDING  Incomplete  Blood culture (routine x 2)     Status: None (Preliminary result)   Collection Time: 03/11/17  9:25 AM  Result Value Ref Range Status   Specimen Description BLOOD LEFT ANTECUBITAL  Final   Special Requests   Final    BOTTLES DRAWN AEROBIC AND ANAEROBIC Blood Culture adequate volume   Culture NO GROWTH 4 DAYS  Final   Report Status PENDING  Incomplete  Culture, blood (routine x 2) Call MD if unable to obtain prior to antibiotics being given     Status: None (Preliminary result)   Collection Time: 03/11/17  2:50 PM  Result Value Ref Range Status   Specimen Description BLOOD LEFT ANTECUBITAL  Final   Special Requests   Final    BOTTLES DRAWN AEROBIC AND ANAEROBIC Blood Culture adequate volume   Culture NO GROWTH 4 DAYS  Final   Report Status PENDING  Incomplete  Culture, blood (routine x 2) Call MD if unable to obtain prior to antibiotics being given     Status: None (Preliminary result)   Collection Time: 03/11/17  3:00 PM  Result Value Ref Range Status  Specimen Description BLOOD LEFT HAND  Final   Special Requests   Final    BOTTLES DRAWN AEROBIC ONLY Blood Culture adequate volume   Culture NO GROWTH 4 DAYS  Final   Report Status PENDING  Incomplete  Culture, sputum-assessment     Status: None   Collection Time: 03/11/17  7:26 PM  Result Value Ref Range Status   Specimen Description EXPECTORATED SPUTUM  Final   Special Requests NONE  Final   Sputum evaluation   Final    Sputum specimen not acceptable for testing.  Please recollect.   Dorena Bodo RN [id number] 03/11/17 A BROWNING    Report Status 03/11/2017 FINAL   Final  Culture, expectorated sputum-assessment     Status: None   Collection Time: 03/11/17 11:31 PM  Result Value Ref Range Status   Specimen Description EXPECTORATED SPUTUM  Final   Special Requests NONE  Final   Sputum evaluation THIS SPECIMEN IS ACCEPTABLE FOR SPUTUM CULTURE  Final   Report Status 03/13/2017 FINAL  Final  Culture, respiratory (NON-Expectorated)     Status: None   Collection Time: 03/11/17 11:31 PM  Result Value Ref Range Status   Specimen Description EXPECTORATED SPUTUM  Final   Special Requests NONE Reflexed from [id number]  Final   Gram Stain   Final    ABUNDANT WBC PRESENT,BOTH PMN AND MONONUCLEAR MODERATE GRAM POSITIVE COCCI IN PAIRS MODERATE GRAM NEGATIVE RODS FEW GRAM NEGATIVE COCCI IN PAIRS    Culture Consistent with normal respiratory flora.  Final   Report Status 03/15/2017 FINAL  Final     Time coordinating discharge: Over 30 minutes  SIGNED:   Hosie Poisson, MD  [facility] 03/16/2017, 11:07 AM Pager   If 7PM-7AM, please contact night-coverage [email or web address] Password [email or web address]

## 2017-03-16 NOTE — Care Management Note (Signed)
Case Management Note  Patient Details  Name: Tricia Huynh MRN: [id number] Date of Birth: [birth year]  Subjective/Objective:                    Action/Plan: Plan is to d/c to home today. No needs identified per CM.  Expected Discharge Date:  03/16/17               Expected Discharge Plan:  Home/Self Care (resides with grandkids)  In-House Referral:     Discharge planning Services  CM Consult  PosStatus of Service:  Completed, signed off  If discussed at [place] of Stay Meetings, dates discussed:    Additional Comments:  Sharin Mons, RN 03/16/2017, 11:14 AM

## 2017-03-23 ENCOUNTER — Ambulatory Visit: Payer: Medicare Other | Admitting: Endocrinology

## 2017-03-24 ENCOUNTER — Emergency Department (HOSPITAL_COMMUNITY): Payer: Medicare Other

## 2017-03-24 ENCOUNTER — Emergency Department (HOSPITAL_COMMUNITY)
Admission: EM | Admit: 2017-03-24 | Discharge: 2017-03-24 | Disposition: A | Discharge status: Home / Self Care | Payer: Medicare Other | Attending: Emergency Medicine | Admitting: Emergency Medicine

## 2017-03-24 ENCOUNTER — Encounter (HOSPITAL_COMMUNITY): Payer: Self-pay

## 2017-03-24 DIAGNOSIS — Z794 Long term (current) use of insulin: Secondary | ICD-10-CM | POA: Diagnosis not present

## 2017-03-24 DIAGNOSIS — I10 Essential (primary) hypertension: Secondary | ICD-10-CM | POA: Insufficient documentation

## 2017-03-24 DIAGNOSIS — R55 Syncope and collapse: Secondary | ICD-10-CM | POA: Diagnosis not present

## 2017-03-24 DIAGNOSIS — Z79899 Other long term (current) drug therapy: Secondary | ICD-10-CM | POA: Diagnosis not present

## 2017-03-24 DIAGNOSIS — E1140 Type 2 diabetes mellitus with diabetic neuropathy, unspecified: Secondary | ICD-10-CM | POA: Diagnosis not present

## 2017-03-24 DIAGNOSIS — Z87891 Personal history of nicotine dependence: Secondary | ICD-10-CM | POA: Diagnosis not present

## 2017-03-24 LAB — BASIC METABOLIC PANEL
Anion gap: 10 (ref 5–15)
BUN: <5 mg/dL — ABNORMAL LOW (ref 6–20)
CO2: 26 mmol/L (ref 22–32)
Calcium: 9.4 mg/dL (ref 8.9–10.3)
Chloride: 102 mmol/L (ref 101–111)
Creatinine, Ser: 0.89 mg/dL (ref 0.44–1.00)
GFR calc Af Amer: >60 mL/min (ref >60)
GFR calc non Af Amer: >60 mL/min (ref >60)
Glucose, Bld: 310 mg/dL — ABNORMAL HIGH (ref 65–99)
Potassium: 3.9 mmol/L (ref 3.5–5.1)
Sodium: 138 mmol/L (ref 135–145)

## 2017-03-24 LAB — URINALYSIS, ROUTINE W REFLEX MICROSCOPIC
Bilirubin Urine: NEGATIVE
Glucose, UA: >=500 mg/dL — AB
Ketones, ur: NEGATIVE mg/dL
Nitrite: NEGATIVE
Protein, ur: NEGATIVE mg/dL
Specific Gravity, Urine: 1.008 (ref 1.005–1.030)
pH: 7 (ref 5.0–8.0)

## 2017-03-24 LAB — CBC
HCT: 30.4 % — ABNORMAL LOW (ref 36.0–46.0)
Hemoglobin: 9.8 g/dL — ABNORMAL LOW (ref 12.0–15.0)
MCH: 28 pg (ref 26.0–34.0)
MCHC: 32.2 g/dL (ref 30.0–36.0)
MCV: 86.9 fL (ref 78.0–100.0)
Platelets: 408 10*3/uL — ABNORMAL HIGH (ref 150–400)
RBC: 3.5 MIL/uL — ABNORMAL LOW (ref 3.87–5.11)
RDW: 13.4 % (ref 11.5–15.5)
WBC: 3.5 10*3/uL — ABNORMAL LOW (ref 4.0–10.5)

## 2017-03-24 LAB — I-STAT TROPONIN, ED: Troponin i, poc: 0 ng/mL (ref 0.00–0.08)

## 2017-03-24 LAB — CBG MONITORING, ED: Glucose-Capillary: 308 mg/dL — ABNORMAL HIGH (ref 65–99)

## 2017-03-24 MED ORDER — AMLODIPINE BESYLATE 5 MG PO TABS
5.0000 mg | ORAL_TABLET | Freq: Once | ORAL | Status: AC
  Administered 2017-03-24: 5 mg via ORAL
  Filled 2017-03-24: qty 1

## 2017-03-24 MED ORDER — IRBESARTAN 150 MG PO TABS
150.0000 mg | ORAL_TABLET | Freq: Once | ORAL | Status: AC
  Administered 2017-03-24: 150 mg via ORAL
  Filled 2017-03-24 (×2): qty 1

## 2017-03-24 NOTE — ED Triage Notes (Signed)
PER EMS, police invaded pts home today and she had a syncopal/near syncopal episode, unknown if she lost consciousness. According to EMS the police invaded her home because pt was allegedly harboring a wanted person inside of her home. EMS reports pt was not compliant with them, not speaking much. Pt denies any pain. Pt is alert to self, place and time but when asked about situation and "what happened this morning, pt responded 'I dont know."

## 2017-03-24 NOTE — ED Provider Notes (Signed)
[facility] DEPT Provider Note   CSN: [id number] Arrival date & time: 03/24/17  1123     History   Chief Complaint Chief Complaint  Patient presents with  . Loss of Consciousness    HPI Tricia Huynh is a 61 y.o. female.  The history is provided by the patient and medical records.  Loss of Consciousness      61 year old female with history of hypertension, mood disorder, seizures, diabetic neuropathy, DM 2, presenting to the ED after a syncopal event. Patient reports she was released from the hospital last week after a hospitalization for pneumonia. States she was discharged home on antibiotics, has 2 days left. States her grandson has been staying with her in the meantime as she has been somewhat weak.  States this morning the police barged into her home looking for her grandson.  States she was not aware that he had done anything wrong, states she thinks it is misunderstanding.  States she felt her nerves got "tore up", got lightheaded, and passed out in the floor.  States they did not say how long she was out but feels it was very brief.  No seizure activity, bowel/bladder incontinence witnessed.  States now she just feels very tired.  States she does not do very well when her nerves are tore up.  She denies any chest pain or SOB at present.  States she has continued to have a cough at home, productive with yellow sputum.  Patient is hypertensive on arrival-- did not yet take her BP meds this morning.  Past Medical History:  Diagnosis Date  . Diabetes mellitus type 2 in nonobese ([place]) 06/06/2015  . Diabetes mellitus without complication ([place])   . Diabetic neuropathy ([place]) 06/06/2015  . Dyslipidemia 06/06/2015  . Encephalopathy   . Essential hypertension 06/06/2015  . Hypertension   . Mood disorder ([place]) 06/06/2015  . Pneumonia 03/11/2017  . Seizure [facility])    sees [name]. abd eeg, on keppra    Patient Active Problem List   Diagnosis Date Noted  . HCAP  (healthcare-associated pneumonia) 03/11/2017  . Dyslipidemia 03/11/2017  . Diabetes mellitus with complication ([place])   . Chest pain 12/30/2016  . Seizure disorder ([place]) 12/30/2016  . Diabetic ketoacidosis without coma associated with type 2 diabetes mellitus ([place])   . DKA (diabetic ketoacidoses) ([place]) 11/15/2016  . UTI (urinary tract infection) 11/15/2016  . Acute cystitis without hematuria   . Acute renal failure ([place])   . Hyperglycemia 11/14/2016  . Diabetes ([place]) 06/05/2016  . Altered mental status   . CKD (chronic kidney disease) stage 3, GFR 30-59 ml/min 05/26/2016  . Benign essential HTN 05/26/2016  . Seizures ([place]) 01/08/2016  . Memory loss 10/04/2015  . Acute encephalopathy 06/06/2015  . Diabetic neuropathy ([employer]) 06/06/2015    Past Surgical History:  Procedure Laterality Date  . ABDOMINAL HYSTERECTOMY    . EYE SURGERY Bilateral   . TUBAL LIGATION      OB History    No data available       Home Medications    Prior to Admission medications   Medication Sig Start Date End Date Taking? Authorizing Provider  amitriptyline (ELAVIL) 50 MG tablet Take 50 mg by mouth at bedtime. 05/08/15   [provider]  amLODipine-olmesartan (AZOR) 5-40 MG tablet Take 1 tablet by mouth daily. 10/08/16   [provider]  atorvastatin (LIPITOR) 40 MG tablet Take 40 mg by mouth daily. 05/10/15   [provider]  benzonatate (TESSALON) 200 MG  capsule Take 1 capsule (200 mg total) by mouth 3 (three) times daily as needed for cough. 03/16/17   Hosie Poisson, MD  gabapentin (NEURONTIN) 600 MG tablet Take 600 mg by mouth 2 (two) times daily.  10/08/14   [provider]  guaiFENesin-dextromethorphan (ROBITUSSIN DM) 100-10 MG/5ML syrup Take 5 mLs by mouth every 4 (four) hours as needed for cough. 03/16/17   Hosie Poisson, MD  insulin glargine (LANTUS) 100 UNIT/ML injection Inject 0.1 mLs (10 Units total) into the skin at bedtime. 03/16/17   Hosie Poisson, MD    levETIRAcetam (KEPPRA) 750 MG tablet Take 1 tablet by mouth twice daily. 02/20/17   Marcial Pacas, MD  levofloxacin (LEVAQUIN) 500 MG tablet Take 1 tablet (500 mg total) by mouth daily. 03/16/17   Hosie Poisson, MD  metoprolol succinate (TOPROL-XL) 25 MG 24 hr tablet Take 50 mg by mouth at bedtime. 05/08/15   [provider]    Family History Family History  Problem Relation Age of Onset  . Heart disease Father   . Kidney disease Father   . Diabetes Mother   . Seizures Sister   . Seizures Brother     Social History Social History  Substance Use Topics  . Smoking status: Former Research scientist (life sciences)  . Smokeless tobacco: Never Used  . Alcohol use No     Allergies   Percocet [oxycodone-acetaminophen] and Vicodin [hydrocodone-acetaminophen]   Review of Systems Review of Systems  Cardiovascular: Positive for syncope.  Neurological: Positive for syncope.  All other systems reviewed and are negative.    Physical Exam Updated Vital Signs BP (!) 177/116 (BP Location: Right Arm)   Pulse 100   Temp 98.4 F (36.9 C) (Oral)   Resp 16   Ht 5\' 7"  (1.702 m)   Wt 69.4 kg   SpO2 97%   BMI 23.96 kg/m   Physical Exam  Constitutional: She is oriented to person, place, and time. She appears well-developed and well-nourished. No distress.  HENT:  Head: Normocephalic and atraumatic.  Right Ear: External ear normal.  Left Ear: External ear normal.  Mouth/Throat: Oropharynx is clear and moist.  Eyes: Conjunctivae and EOM are normal. Pupils are equal, round, and reactive to light.  Neck: Normal range of motion and full passive range of motion without pain. Neck supple. No neck rigidity.  No rigidity, no meningismus  Cardiovascular: Normal rate, regular rhythm and normal heart sounds.   No murmur heard. Pulmonary/Chest: Effort normal and breath sounds normal. No respiratory distress. She has no wheezes. She has no rhonchi.  Abdominal: Soft. Bowel sounds are normal. There is no tenderness.  There is no guarding.  Musculoskeletal: Normal range of motion. She exhibits no edema.  Neurological: She is alert and oriented to person, place, and time. She has normal strength. She displays no tremor. No cranial nerve deficit or sensory deficit. She displays no seizure activity.  AAOx3, answering questions and following commands appropriately; equal strength UE and LE bilaterally; CN grossly intact; moves all extremities appropriately without ataxia; no focal neuro deficits or facial asymmetry appreciated  Skin: Skin is warm and dry. No rash noted. She is not diaphoretic.  Psychiatric: She has a normal mood and affect. Her behavior is normal. Thought content normal.  Nursing note and vitals reviewed.    ED Treatments / Results  Labs (all labs ordered are listed, but only abnormal results are displayed) Labs Reviewed  BASIC METABOLIC PANEL - Abnormal; Notable for the following:       Result  Value   Glucose, Bld 310 (*)    BUN <5 (*)    All other components within normal limits  CBC - Abnormal; Notable for the following:    WBC 3.5 (*)    RBC 3.50 (*)    Hemoglobin 9.8 (*)    HCT 30.4 (*)    Platelets 408 (*)    All other components within normal limits  URINALYSIS, ROUTINE W REFLEX MICROSCOPIC - Abnormal; Notable for the following:    Color, Urine STRAW (*)    APPearance HAZY (*)    Glucose, UA >=500 (*)    Hgb urine dipstick SMALL (*)    Leukocytes, UA LARGE (*)    Bacteria, UA RARE (*)    Squamous Epithelial / LPF 0-5 (*)    All other components within normal limits  CBG MONITORING, ED - Abnormal; Notable for the following:    Glucose-Capillary 308 (*)    All other components within normal limits  I-STAT TROPOININ, ED    EKG  EKG Interpretation  Date/Time:  Tuesday Mar 24 2017 11:31:06 EDT Ventricular Rate:  100 PR Interval:    QRS Duration: 86 QT Interval:  360 QTC Calculation: 465 R Axis:   -22 Text Interpretation:  Sinus tachycardia Consider left atrial  enlargement Borderline left axis deviation Abnormal ekg Confirmed by Carmin Muskrat  MD ([id number]) on 03/24/2017 11:37:18 AM       Radiology Dg Chest 2 View  Result Date: 03/24/2017 CLINICAL DATA:  Syncopal episode today. History of hypertension and diabetes. EXAM: CHEST  2 VIEW COMPARISON:  Radiographs 12/30/2016 and 03/11/2017. FINDINGS: Interval near-complete clearing of previously demonstrated right middle lobe opacities. The left lung is clear. There is no pleural effusion or pneumothorax. The heart size and mediastinal contours are stable. The bones appear unremarkable. Telemetry leads overlie the chest. IMPRESSION: Interval near-complete clearing of right middle lobe opacity. Remaining density is most consistent with atelectasis or scarring. No acute findings. Electronically Signed   By: Richardean Sale M.D.   On: 03/24/2017 13:12    Procedures Procedures (including critical care time)  Medications Ordered in ED Medications  amLODipine (NORVASC) tablet 5 mg (5 mg Oral Given 03/24/17 1331)  irbesartan (AVAPRO) tablet 150 mg (150 mg Oral Given 03/24/17 1511)     Initial Impression / Assessment and Plan / ED Course  I have reviewed the triage vital signs and the nursing notes.  Pertinent labs & imaging results that were available during my care of the patient were reviewed by me and considered in my medical decision making (see chart for details).  61 year old female here after syncopal event. This occurred after the police barged in her home looking for her grandson. Reports her "nerves got tore up" causing her to pass out briefly. There was no seizure activity.  Patient is awake, alert, appropriately oriented here. Neurologically intact.  States she feels fine currently, states her nerves are still a bit "shook".  EKG non-ischemic.  Screening lab work overall reassuring.  UA with leuks but rare bacteria.  CXR with clearing of her pneumonia, does have 2 days of abx left.  UA with large leuks,  rare bacteria.  Blood pressure initially elevated here, however patient did not yet take her morning medications. BP improved here with her home meds.  No signs/symptoms concerning for end organ damage.  Feel her syncope likely a vagal response from anxiety and situational circumstances.  Lower suspicion for acute cardiac or neurologic etiology of her syncope.  Will have  her finish her antibiotics  follow-up closely with her primary care doctor.  Discussed plan with patient, he/she acknowledged understanding and agreed with plan of care.  Return precautions given for new or worsening symptoms.  Final Clinical Impressions(s) / ED Diagnoses   Final diagnoses:  Syncope, unspecified syncope type    New Prescriptions New Prescriptions   No medications on file     Larene Pickett, PA-C 03/24/17 1528    Larene Pickett, PA-C 03/24/17 1529    Carmin Muskrat, MD 03/29/17 [phone]

## 2017-03-24 NOTE — Discharge Instructions (Signed)
As we discussed, your lab work today looked good.  Chest x-ray showed your pneumonia has resolved. Go ahead and finish your antibiotics. Resume your blood pressure medications tomorrow morning as scheduled. Follow-up with your primary care doctor. Return here for new concerns.

## 2017-03-24 NOTE — ED Notes (Signed)
Pt A&OX4, ambulatory at d/c with steady gait, NAD, declined wheelchair.

## 2017-03-30 ENCOUNTER — Ambulatory Visit: Payer: Medicare Other | Admitting: Endocrinology

## 2017-03-30 DIAGNOSIS — Z0289 Encounter for other administrative examinations: Secondary | ICD-10-CM

## 2017-03-31 ENCOUNTER — Telehealth: Payer: Self-pay | Admitting: Endocrinology

## 2017-03-31 NOTE — Telephone Encounter (Signed)
Please come back for a follow-up appointment in 2-3 weeks

## 2017-03-31 NOTE — Telephone Encounter (Signed)
Patient no showed today's appt. Please advise on how to follow up. °A. No follow up necessary. °B. Follow up urgent. Contact patient immediately. °C. Follow up necessary. Contact patient and schedule visit in ___ days. °D. Follow up advised. Contact patient and schedule visit in ____weeks. ° °

## 2017-04-01 ENCOUNTER — Telehealth: Payer: Self-pay | Admitting: Nurse Practitioner

## 2017-04-01 NOTE — Telephone Encounter (Signed)
Labs drawn 03/26/2017 Glucose 363 creatinine 1.7 hemoglobin 10.8 hematocrit 32.8 platelets 433

## 2017-04-02 NOTE — Telephone Encounter (Signed)
Called patient to schedule, no answer.

## 2017-04-21 ENCOUNTER — Telehealth: Payer: Self-pay | Admitting: Family Medicine

## 2017-04-21 NOTE — Telephone Encounter (Signed)
Suezanne Jacquet, rep at [facility] mail order pharm, calling to check on status for script for diabetic testing supplies?  Please call back to verify. It was faxed yesterday 12pm.  ext [phone] (representative)  Thank you,  -LL

## 2017-04-24 NOTE — Telephone Encounter (Signed)
Attempted to contact Suezanne Jacquet to notify him I had not received the paperwork, but the extension was not correct.

## 2017-04-28 ENCOUNTER — Ambulatory Visit
Admission: RE | Admit: 2017-04-28 | Discharge: 2017-04-28 | Disposition: A | Discharge status: Home / Self Care | Payer: Medicare Other | Source: Ambulatory Visit | Attending: Family Medicine | Admitting: Family Medicine

## 2017-04-28 ENCOUNTER — Other Ambulatory Visit: Payer: Self-pay | Admitting: Family Medicine

## 2017-04-28 DIAGNOSIS — J189 Pneumonia, unspecified organism: Secondary | ICD-10-CM

## 2017-05-13 ENCOUNTER — Ambulatory Visit: Payer: Medicare Other | Admitting: Nurse Practitioner

## 2017-05-14 ENCOUNTER — Encounter: Payer: Self-pay | Admitting: Nurse Practitioner

## 2017-05-26 ENCOUNTER — Telehealth: Payer: Self-pay | Admitting: Nurse Practitioner

## 2017-05-26 MED ORDER — LEVETIRACETAM 750 MG PO TABS: 750.0000 mg | ORAL_TABLET | Freq: Two times a day (BID) | ORAL | 6 refills | Status: DC

## 2017-05-26 NOTE — Telephone Encounter (Signed)
Keppra reordered to [facility] at daughter's request.

## 2017-05-26 NOTE — Telephone Encounter (Signed)
Patient called office requesting refill for levETIRAcetam (KEPPRA) 750 MG tablet.  Advised patients daughter medication was filled 02/20/17 with pillpack, per daughter patient has not been getting medication from pillpack.  Pharmacy- [facility].

## 2017-05-26 NOTE — Addendum Note (Signed)
Addended by: Florian Buff C on: 05/26/2017 11:25 AM   Modules accepted: Orders

## 2017-08-05 NOTE — Progress Notes (Signed)
[facility]  PATIENT: Drianna Chandran DOB: 1956/07/02   REASON FOR VISIT: follow up for seizure disorder, abnormality of gait, diabetic polyneuropathy HISTORY FROM:patient     HISTORY OF PRESENT ILLNESS:Yasemin Rouse is a 61 years old right-handed female, seen in refer by her primary care physician Dr. Audley Hose for evaluation of memory trouble, seizure, she took scat transportation to office, alone at today's clinical visit in July 04 2015.  She had a history of insulin-dependent diabetes, hypertension, hyperlipidemia, lives with her granddaughters, she has been on disability due to her depression since 1998, had 10 years education, used to work at [employer],  She was admitted to the hospital from July 20 to 21 2016, was found down at home, confused, with bowel and bladder incontinence, she was seen by neuro hospitalists, abnormal EEG in July 2016,focal slowing over the occipital regions, left greater than right, Posterior-dominant photoepileptiform response, asymmetric more on the left occipital region, she was started on Keppra 500 mg twice a day, no recurrent seizure event  I have reviewed MRI of the brain without contrast of July 2016:1. No acute intracranial infarct or other abnormality identified. Mild age-related cerebral atrophy. 2.5 cm well-circumscribed ovoid lesion within the left occipital scalp. This lesion is indeterminate, but most certainly benign.  Laboratory evaluation, creatinine 1. 3, low TSH 0.2 CBC showed mild anemia hemoglobin 11 point 5, negative ammonia, UDS, A1c 11. 8,   UPDATE Oct 04 2015:YY She is with her granddaughter [name], She has one seizure in Oct 02 2015, she was not able to elaborate on details. She lives with her granddaughter, she has gait difficulty. She denies significant pain. She has no bladder incontinence, she has mild constipation. 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 Her migraines has much improved  UPDATE Jan 08 2016:YY She is with her daughter at today's clinical visit, she was diagnosed with UTI, was put on antibiotic, has decreased appetite, generalized weakness, continue has gait difficulty, memory trouble, We have personally reviewed her MRI in July 2016, generalized atrophy, especially along perisylvian fissure MRI of lumbar, mild degenerative changes, no significant foraminal or canal stenosis, EMG nerve conduction study January 2017 consistent with mild axonal peripheral neuropathy, consistent with her poorly controlled diabetes, most recent A1c was 11.9, she has a follow-up appointment with endocrinologist Dr. Hilliard Clark today We reviewed laboratory evaluation, mild anemia 10 point 8, She has no recurrent seizure, able to tolerate Keppra 500/1000 mg every night, she lives with her granddaughter, has home health nurse prepare her medications, UPDATE 06/20/2017CM Ms. Murtaugh 61 year old female returns for follow-up.She has history of seizure disorder and is currently on Keppra 500 mg 1 in a.m. And 2 PM. Last seizure November 2016. She is also insulin-dependent diabetic and her CBGs this morning 293. EMG consistent with axonal peripheral neuropathy consistent with poorly controlled diabetes.MRI of the brain in  2016 with generalized atrophy.She returns for reevaluation. UPDATE 12/27/2017CM Ms. Gales, 61 year old female returns for follow-up for history of seizure disorder. She is currently on Keppra 750 twice daily. She had a seizure last week after being off her Keppra. She has been off of it for about one week prior to last week last seizure event occurred November 2016.  She is also insulin-dependent diabetic but has stopped her insulin in July  according to Dr. Cordelia Pen note. She is now back on therapy but is not consistent with  Checking  her sugars UPDATE 09/20/2018CM  Ms. Tomer ,61 year old female returns for follow-up  seizure disorder.  Last seizure December 2017 after being off of her seizure medication for about a week  She is currently on Keppra 750 mg twice daily without side effects. She has been compliant with her medication.  Her diabetes is in better control component hemoglobin A1c today 7.5. She denies any falls. She returns for reevaluation REVIEW OF SYSTEMS: Full 14 system review of systems performed and notable only for those listed, all others are neg:  Constitutional:  neg Cardiovascular: neg Ear/Nose/Throat: neg  Skin: neg Eyes: neg Respiratory: neg Gastroitestinal: neg  Hematology/Lymphatic: neg  Endocrine: excessive thirst Musculoskeletal Walking difficulty Allergy/Immunology: neg Neurological: seizure disorder,   Psychiatric: depression, anxiety  Sleep : neg ALLERGIES: Allergies  Allergen Reactions  . Percocet [Oxycodone-Acetaminophen] Nausea And Vomiting and Other (See Comments)    Shaky/unsteady.  . Vicodin [Hydrocodone-Acetaminophen] Nausea And Vomiting and Other (See Comments)    Shaky/unsteady.    HOME MEDICATIONS: Outpatient Medications Prior to Visit  Medication Sig Dispense Refill  . amitriptyline (ELAVIL) 50 MG tablet Take 50 mg by mouth at bedtime.  1  . amLODipine-olmesartan (AZOR) 5-40 MG tablet Take 1 tablet by mouth daily.    Marland Kitchen atorvastatin (LIPITOR) 40 MG tablet Take 40 mg by mouth daily.  1  . gabapentin (NEURONTIN) 600 MG tablet Take 600 mg by mouth 2 (two) times daily.   0  . insulin glargine (LANTUS) 100 UNIT/ML injection Inject 0.28 mLs (28 Units total) into the skin daily with breakfast. 10 mL 11  . levETIRAcetam (KEPPRA) 750 MG tablet Take 1 tablet (750 mg total) by mouth 2 (two) times daily. 60 tablet 6  . metoprolol succinate (TOPROL-XL) 25 MG 24 hr tablet Take 50 mg by  mouth at bedtime.  1   No facility-administered medications prior to visit.     PAST MEDICAL HISTORY: Past Medical History:  Diagnosis Date  . Diabetes mellitus type 2 in nonobese ([place]) 06/06/2015  . Diabetes mellitus without complication ([place])   . Diabetic neuropathy ([place]) 06/06/2015  . Dyslipidemia 06/06/2015  . Encephalopathy   . Essential hypertension 06/06/2015  . Hypertension   . Mood disorder ([place]) 06/06/2015  . Pneumonia 03/11/2017  . Seizure ([place])    sees [name]. abd eeg, on keppra    PAST SURGICAL HISTORY: Past Surgical History:  Procedure Laterality Date  . ABDOMINAL HYSTERECTOMY    . EYE SURGERY Bilateral   . TUBAL LIGATION      FAMILY HISTORY: Family History  Problem Relation Age of Onset  . Heart disease Father   . Kidney disease Father   . Diabetes Mother   . Seizures Sister   . Seizures Brother     SOCIAL HISTORY: Social History   Social History  . Marital status: Single    Spouse name: N/A  . Number of children: 3  . Years of education: 10   Occupational History  . Disabled    Social History Main Topics  . Smoking status: Former Research scientist (life sciences)  . Smokeless tobacco: Never Used  . Alcohol use No  . Drug use: No  . Sexual activity: Yes   Other Topics Concern  . Not on file   Social History Narrative   Lives at home with her grandchildren.  Right-handed.   No caffeine use.     PHYSICAL EXAM  Vitals:   08/06/17 1440  BP: (!) 170/96   Pulse: 82  Weight: 148 lb 3.2 oz (67.2 kg)  Height: 5\' 7"  (1.702 m)   Body mass index is 23.21 kg/m.  Generalized: Well developed, in no acute distress , well-groomed Head: normocephalic and atraumatic,. Oropharynx benign  Neck: Supple, no carotid bruits  Cardiac: Regular rate rhythm, no murmur  Musculoskeletal: No deformity   Neurological examination   Mentation: Alert oriented to time, place, history taking. Attention span and concentration appropriate.  Follows all commands speech and language  fluent.  Cranial nerve II-XII: Pupils were equal round reactive to light extraocular movements were full, visual field were full on confrontational test. Facial sensation and strength were normal. hearing was intact to finger rubbing bilaterally. Uvula tongue midline. head turning and shoulder shrug were normal and symmetric.Tongue protrusion into cheek strength was normal. Motor: normal bulk and tone,mild bilateral hip flexion weakness Sensory: length dependent decreased light touch and pinprick and vibratory sensation in the lower extremities  Coordination: finger-nose-finger, heel-to-shin bilaterally, no dysmetria Reflexes:  symmetric upper and lower  plantar responses were flexor bilaterally. Gait and Station: Rising up from seated position without assistance, cautious gait  ,  No assistive device  DIAGNOSTIC DATA (LABS, IMAGING, TESTING) - I reviewed patient records, labs, notes, testing and imaging myself where available.  Lab Results  Component Value Date   WBC 3.5 (L) 03/24/2017   HGB 9.8 (L) 03/24/2017   HCT 30.4 (L) 03/24/2017   MCV 86.9 03/24/2017   PLT 408 (H) 03/24/2017      Component Value Date/Time   NA 138 03/24/2017 1144   K 3.9 03/24/2017 1144   CL 102 03/24/2017 1144   CO2 26 03/24/2017 1144   GLUCOSE 310 (H) 03/24/2017 1144   BUN <5 (L) 03/24/2017 1144   CREATININE 0.89 03/24/2017 1144   CALCIUM 9.4 03/24/2017 1144   PROT 8.3 (H) 12/30/2016 1815   ALBUMIN 3.9 12/30/2016 1815   AST 21 12/30/2016 1815   ALT 16 12/30/2016 1815   ALKPHOS 177 (H) 12/30/2016 1815   BILITOT 0.6 12/30/2016 1815   GFRNONAA >60 03/24/2017 1144   GFRAA >60 03/24/2017 1144    Lab Results  Component Value Date   HGBA1C 7.5 08/06/2017       ASSESSMENT AND PLAN  61 y.o. year old female  has a past medical history of seizure disorder with last seizure occurring in November 2016, Another seizure Dec 2017after being off of her Keppra. Memory loss with MRI of the brain with evidence  of generalized atrophy, mood disorder likely plays a role as well. Multifactorial gait disorder from deconditioning and diabetic peripheral neuropathy.    PLAN: Continue Keppra 750 twice daily  Blood sugars in better control just seen by  endocrinologist Follow-up in1 year Call for seizure activity Dennie Bible, [facility], APRN  [facility] [address] [place], [place] [address] [phone]

## 2017-08-06 ENCOUNTER — Ambulatory Visit (INDEPENDENT_AMBULATORY_CARE_PROVIDER_SITE_OTHER): Payer: Medicare Other | Admitting: Endocrinology

## 2017-08-06 ENCOUNTER — Encounter: Payer: Self-pay | Admitting: Nurse Practitioner

## 2017-08-06 ENCOUNTER — Encounter: Payer: Self-pay | Admitting: Endocrinology

## 2017-08-06 ENCOUNTER — Ambulatory Visit (INDEPENDENT_AMBULATORY_CARE_PROVIDER_SITE_OTHER): Payer: Medicare Other | Admitting: Nurse Practitioner

## 2017-08-06 VITALS — BP 170/102 | HR 90 | Wt 148.0 lb

## 2017-08-06 VITALS — BP 170/96 | HR 82 | Ht 67.0 in | Wt 148.2 lb

## 2017-08-06 DIAGNOSIS — E118 Type 2 diabetes mellitus with unspecified complications: Secondary | ICD-10-CM | POA: Diagnosis not present

## 2017-08-06 DIAGNOSIS — R269 Unspecified abnormalities of gait and mobility: Secondary | ICD-10-CM

## 2017-08-06 DIAGNOSIS — G40909 Epilepsy, unspecified, not intractable, without status epilepticus: Secondary | ICD-10-CM

## 2017-08-06 DIAGNOSIS — E0849 Diabetes mellitus due to underlying condition with other diabetic neurological complication: Secondary | ICD-10-CM

## 2017-08-06 LAB — POCT GLYCOSYLATED HEMOGLOBIN (HGB A1C): Hemoglobin A1C: 7.5

## 2017-08-06 MED ORDER — INSULIN GLARGINE 100 UNIT/ML ~~LOC~~ SOLN: 28.0000 [IU] | Freq: Every day | SUBCUTANEOUS | 11 refills | Status: DC

## 2017-08-06 MED ORDER — INSULIN GLARGINE 100 UNIT/ML ~~LOC~~ SOLN: 30.0000 [IU] | Freq: Every day | SUBCUTANEOUS | 11 refills | Status: DC

## 2017-08-06 NOTE — Progress Notes (Signed)
Subjective:    Patient ID: Tricia Huynh, female    DOB: 1956-08-23, 61 y.o.   MRN: [id number]  HPI Pt returns for f/u of diabetes mellitus: DM type: 1 Dx'ed: 8416 Complications: polyneuropathy Therapy: insulin since 2010.   GDM: never DKA: once, in late 2017 Severe hypoglycemia: once, in 2017.  Pancreatitis: never.  Other: She takes QD insulin, due to noncompliance; insurance declined V-GO.  Interval history: no cbg record, but states cbg's are well-controlled.  pt states she feels well in general.  Pt says she never misses the insulin.  She says she takes 30 units qam.  She has hypoglycemia approx qod, and these episodes ar mild. This usually happens fasting.  Past Medical History:  Diagnosis Date  . Diabetes mellitus type 2 in nonobese ([place]) 06/06/2015  . Diabetes mellitus without complication ([place])   . Diabetic neuropathy ([place]) 06/06/2015  . Dyslipidemia 06/06/2015  . Encephalopathy   . Essential hypertension 06/06/2015  . Hypertension   . Mood disorder ([place]) 06/06/2015  . Pneumonia 03/11/2017  . Seizure [facility])    sees [name]. abd eeg, on keppra    Past Surgical History:  Procedure Laterality Date  . ABDOMINAL HYSTERECTOMY    . EYE SURGERY Bilateral   . TUBAL LIGATION      Social History   Social History  . Marital status: Single    Spouse name: N/A  . Number of children: 3  . Years of education: 10   Occupational History  . Disabled    Social History Main Topics  . Smoking status: Former Research scientist (life sciences)  . Smokeless tobacco: Never Used  . Alcohol use No  . Drug use: No  . Sexual activity: Yes   Other Topics Concern  . Not on file   Social History Narrative   Lives at home with her grandchildren.   Right-handed.   No caffeine use.    Current Outpatient Prescriptions on File Prior to Visit  Medication Sig Dispense Refill  . amitriptyline (ELAVIL) 50 MG tablet Take 50 mg by mouth at bedtime.  1  . amLODipine-olmesartan (AZOR) 5-40 MG tablet Take 1 tablet  by mouth daily.    Marland Kitchen atorvastatin (LIPITOR) 40 MG tablet Take 40 mg by mouth daily.  1  . gabapentin (NEURONTIN) 600 MG tablet Take 600 mg by mouth 2 (two) times daily.   0  . levETIRAcetam (KEPPRA) 750 MG tablet Take 1 tablet (750 mg total) by mouth 2 (two) times daily. 60 tablet 6  . metoprolol succinate (TOPROL-XL) 25 MG 24 hr tablet Take 50 mg by mouth at bedtime.  1   No current facility-administered medications on file prior to visit.     Allergies  Allergen Reactions  . Percocet [Oxycodone-Acetaminophen] Nausea And Vomiting and Other (See Comments)    Shaky/unsteady.  . Vicodin [Hydrocodone-Acetaminophen] Nausea And Vomiting and Other (See Comments)    Shaky/unsteady.    Family History  Problem Relation Age of Onset  . Heart disease Father   . Kidney disease Father   . Diabetes Mother   . Seizures Sister   . Seizures Brother     BP (!) 170/102   Pulse 90   Wt 148 lb (67.1 kg)   SpO2 97%   BMI 23.18 kg/m    Review of Systems Denies LOC    Objective:   Physical Exam VITAL SIGNS:  See vs page GENERAL: no distress Pulses: foot pulses are intact bilaterally.   MSK: no deformity of the feet or  ankles.  CV: no edema of the legs or ankles Skin:  no ulcer on the feet or ankles.  normal color and temp on the feet and ankles Neuro: sensation is intact to touch on the feet and ankles, but decreased from normal  Lab Results  Component Value Date   HGBA1C 7.5 08/06/2017      Assessment & Plan:  Itype 1 DM, with polyneuropathy: slightly overcontrolled, given this regimen, which does match insulin to her changing needs throughout the day  Patient Instructions  check your blood sugar twice a day.  vary the time of day when you check, between before the 3 meals, and at bedtime.  also check if you have symptoms of your blood sugar being too high or too low.  please keep a record of the readings and bring it to your next appointment here (or you can bring the meter itself).   You can write it on any piece of paper.  please call us sooner if your blood sugar goes below 70, or if you have a lot of readings over 200.   Please reduce the lantus to 28 units each morning.   On this type of insulin schedule, you should eat meals on a regular schedule.  If a meal is missed or significantly delayed, your blood sugar could go low.  Please come back for a follow-up appointment in 4 months.

## 2017-08-06 NOTE — Patient Instructions (Addendum)
check your blood sugar twice a day.  vary the time of day when you check, between before the 3 meals, and at bedtime.  also check if you have symptoms of your blood sugar being too high or too low.  please keep a record of the readings and bring it to your next appointment here (or you can bring the meter itself).  You can write it on any piece of paper.  please call us sooner if your blood sugar goes below 70, or if you have a lot of readings over 200.   Please reduce the lantus to 28 units each morning.   On this type of insulin schedule, you should eat meals on a regular schedule.  If a meal is missed or significantly delayed, your blood sugar could go low.  Please come back for a follow-up appointment in 4 months.

## 2017-08-06 NOTE — Patient Instructions (Signed)
Continue Keppra 750 twice daily will refill Blood sugars in better control just seen by  endocrinologist Follow-up in1 year Call for seizure activity

## 2017-08-07 NOTE — Progress Notes (Signed)
I have reviewed and agreed above plan. 

## 2017-08-25 IMAGING — DX DG CHEST 2V
2 series · 2 of 2 positions shown · non-contrast
Comparison: 03/24/2017

CLINICAL DATA: PNA; patient states symptoms have improved, c/o mid
chest pain occasionally; hx diabetic, HTN, former smoker

EXAM:
CHEST  2 VIEW

[dg chest 2 view (1 of 2)]
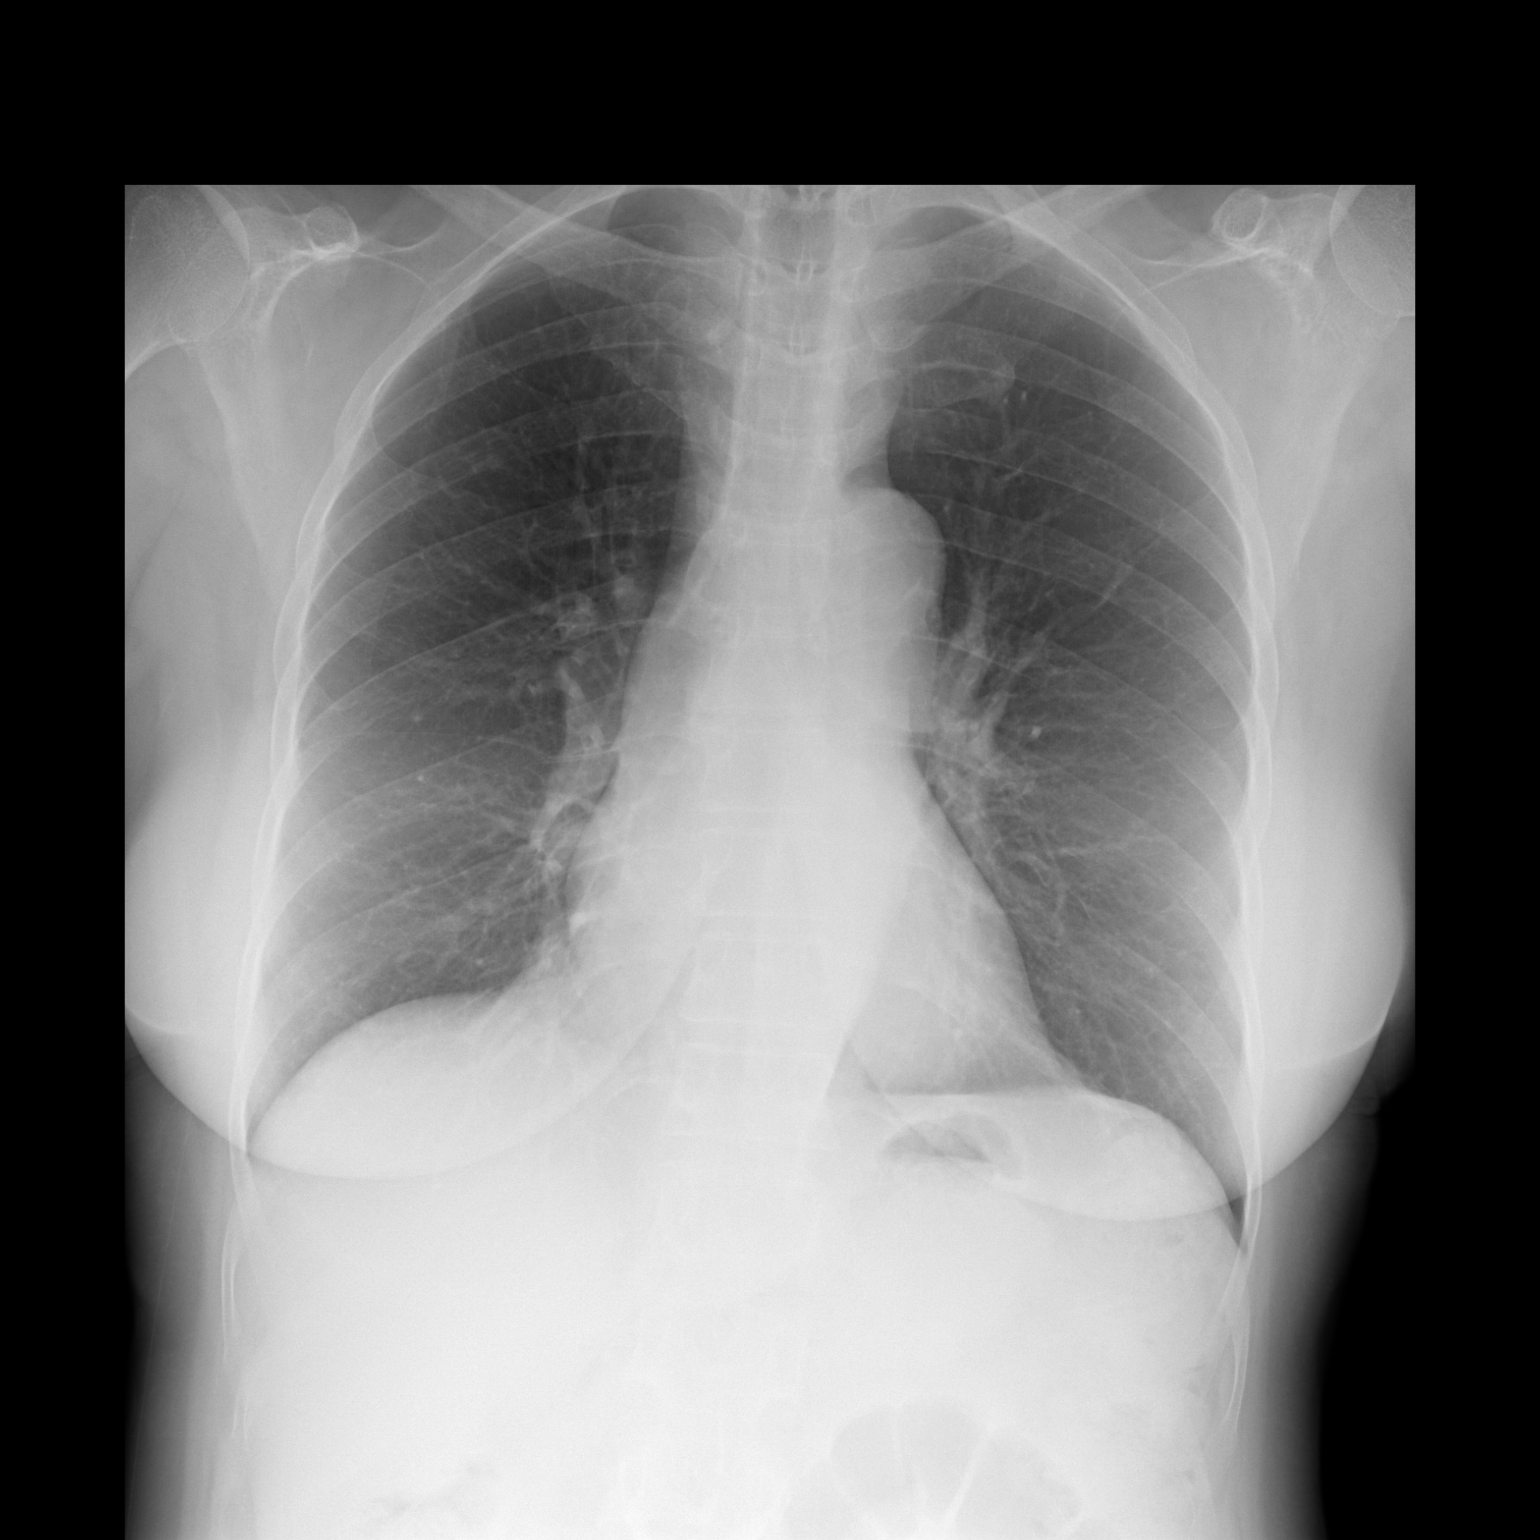

[dg chest 2 view (2 of 2)]
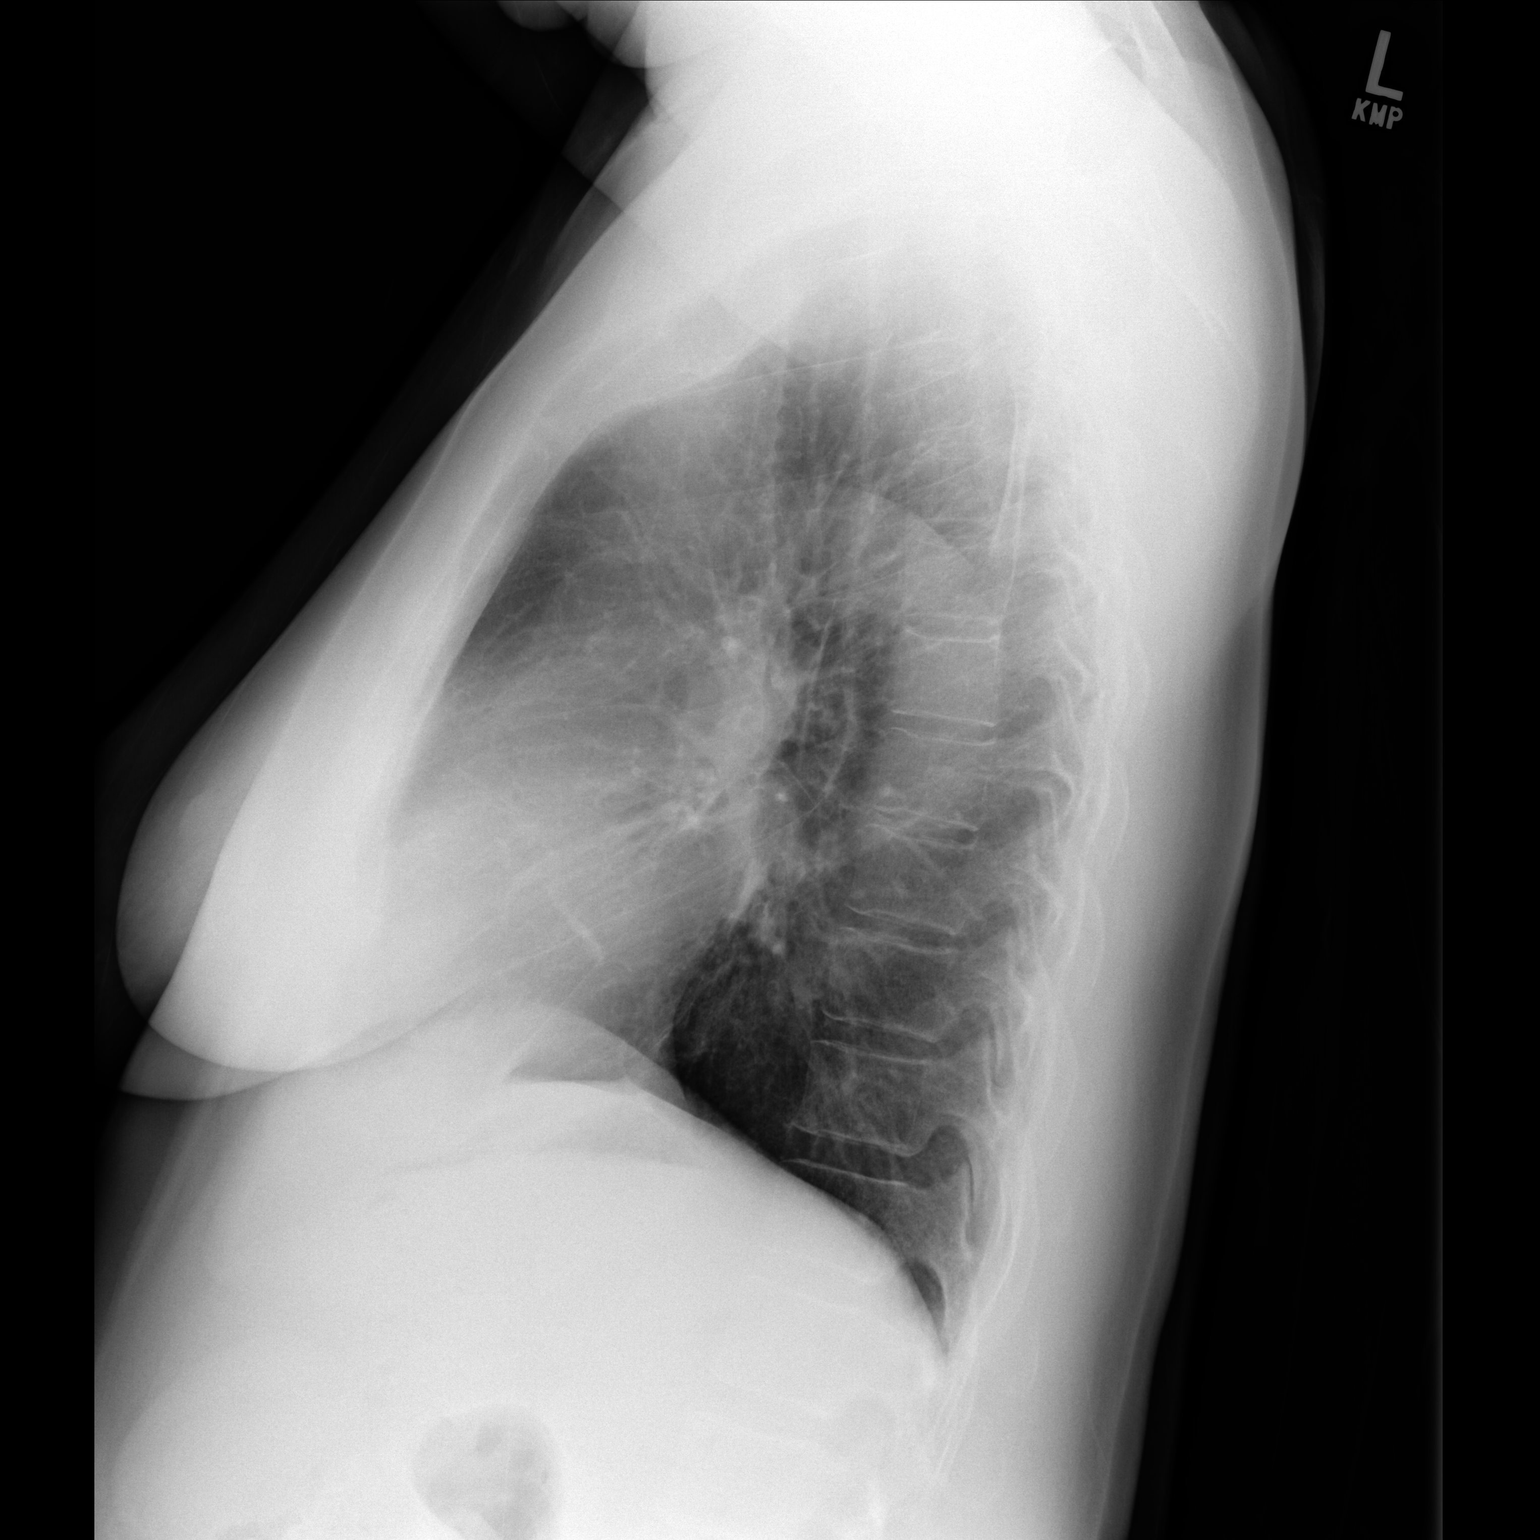

[2 of 2 positions shown; findings below may reference images not displayed]

FINDINGS: Lungs are now clear. Residual opacity noted in the right middle lobe
on the prior study has resolved.

No pleural effusion or pneumothorax.

Heart, mediastinum and hila are unremarkable. Skeletal structures
are unremarkable.
IMPRESSION: 1. Complete resolution of the right middle lobe pneumonia.
2. No active cardiopulmonary disease.

## 2017-09-01 ENCOUNTER — Other Ambulatory Visit: Payer: Self-pay | Admitting: Family Medicine

## 2017-09-01 DIAGNOSIS — E2839 Other primary ovarian failure: Secondary | ICD-10-CM

## 2017-09-01 DIAGNOSIS — Z1231 Encounter for screening mammogram for malignant neoplasm of breast: Secondary | ICD-10-CM

## 2017-09-22 ENCOUNTER — Ambulatory Visit
Admission: RE | Admit: 2017-09-22 | Discharge: 2017-09-22 | Disposition: A | Discharge status: Home / Self Care | Payer: Medicare Other | Source: Ambulatory Visit | Attending: Family Medicine | Admitting: Family Medicine

## 2017-09-22 DIAGNOSIS — E2839 Other primary ovarian failure: Secondary | ICD-10-CM

## 2017-09-22 DIAGNOSIS — Z1231 Encounter for screening mammogram for malignant neoplasm of breast: Secondary | ICD-10-CM

## 2017-12-07 ENCOUNTER — Ambulatory Visit: Payer: Medicare Other | Admitting: Endocrinology

## 2017-12-31 ENCOUNTER — Encounter: Payer: Self-pay | Admitting: Endocrinology

## 2017-12-31 ENCOUNTER — Ambulatory Visit (INDEPENDENT_AMBULATORY_CARE_PROVIDER_SITE_OTHER): Payer: Medicare Other | Admitting: Endocrinology

## 2017-12-31 VITALS — BP 162/110 | HR 112 | Wt 143.2 lb

## 2017-12-31 DIAGNOSIS — E1042 Type 1 diabetes mellitus with diabetic polyneuropathy: Secondary | ICD-10-CM

## 2017-12-31 LAB — POCT GLYCOSYLATED HEMOGLOBIN (HGB A1C): Hemoglobin A1C: 10.2

## 2017-12-31 MED ORDER — INSULIN GLARGINE 100 UNIT/ML SOLOSTAR PEN: 30.0000 [IU] | PEN_INJECTOR | SUBCUTANEOUS | 99 refills | Status: DC

## 2017-12-31 NOTE — Patient Instructions (Addendum)
Your blood pressure is high today.  Please see your primary care provider soon, to have it rechecked. check your blood sugar twice a day.  vary the time of day when you check, between before the 3 meals, and at bedtime.  also check if you have symptoms of your blood sugar being too high or too low.  please keep a record of the readings and bring it to your next appointment here (or you can bring the meter itself).  You can write it on any piece of paper.  please call us sooner if your blood sugar goes below 70, or if you have a lot of readings over 200.   Please increase the lantus back up to 30 units each morning.   On this type of insulin schedule, you should eat meals on a regular schedule.  If a meal is missed or significantly delayed, your blood sugar could go low. Also, you may need to eat a light snack at bedtime, to keep it from going low in the early morning. Please come back for a follow-up appointment in 2 months.

## 2017-12-31 NOTE — Progress Notes (Signed)
Subjective:    Patient ID: Tricia Huynh, female    DOB: 08/24/1956, 62 y.o.   MRN: [id number]  HPI Pt returns for f/u of diabetes mellitus: DM type: 1 Dx'ed: 1540 Complications: polyneuropathy Therapy: insulin since 2010.   GDM: never DKA: once, in late 2017 Severe hypoglycemia: once, in 2017.  Pancreatitis: never.  Other: She takes QD insulin, due to noncompliance; insurance declined V-GO.  Interval history: She says she never misses the insulin.  no cbg record, but states cbg's vary from 80-400.  It is in general higher as the day goes on.  pt states she feels well in general. Past Medical History:  Diagnosis Date  . Diabetes mellitus type 2 in nonobese ([place]) 06/06/2015  . Diabetes mellitus without complication ([place])   . Diabetic neuropathy ([place]) 06/06/2015  . Dyslipidemia 06/06/2015  . Encephalopathy   . Essential hypertension 06/06/2015  . Hypertension   . Mood disorder ([place]) 06/06/2015  . Pneumonia 03/11/2017  . Seizure [facility])    sees [name]. abd eeg, on keppra    Past Surgical History:  Procedure Laterality Date  . ABDOMINAL HYSTERECTOMY    . EYE SURGERY Bilateral   . TUBAL LIGATION      Social History   Socioeconomic History  . Marital status: Single    Spouse name: Not on file  . Number of children: 3  . Years of education: 10  . Highest education level: Not on file  Social Needs  . Financial resource strain: Not on file  . Food insecurity - worry: Not on file  . Food insecurity - inability: Not on file  . Transportation needs - medical: Not on file  . Transportation needs - non-medical: Not on file  Occupational History  . Occupation: Disabled  Tobacco Use  . Smoking status: Former Research scientist (life sciences)  . Smokeless tobacco: Never Used  Substance and Sexual Activity  . Alcohol use: No  . Drug use: No  . Sexual activity: Yes  Other Topics Concern  . Not on file  Social History Narrative   Lives at home with her grandchildren.   Right-handed.   No caffeine  use.    Current Outpatient Medications on File Prior to Visit  Medication Sig Dispense Refill  . amitriptyline (ELAVIL) 50 MG tablet Take 50 mg by mouth at bedtime.  1  . atorvastatin (LIPITOR) 40 MG tablet Take 40 mg by mouth daily.  1  . gabapentin (NEURONTIN) 600 MG tablet Take 600 mg by mouth 2 (two) times daily.   0  . levETIRAcetam (KEPPRA) 750 MG tablet Take 1 tablet (750 mg total) by mouth 2 (two) times daily. 60 tablet 6  . amLODipine-olmesartan (AZOR) 5-40 MG tablet Take 1 tablet by mouth daily.    . metoprolol succinate (TOPROL-XL) 25 MG 24 hr tablet Take 50 mg by mouth at bedtime.  1   No current facility-administered medications on file prior to visit.     Allergies  Allergen Reactions  . Percocet [Oxycodone-Acetaminophen] Nausea And Vomiting and Other (See Comments)    Shaky/unsteady.  . Vicodin [Hydrocodone-Acetaminophen] Nausea And Vomiting and Other (See Comments)    Shaky/unsteady.    Family History  Problem Relation Age of Onset  . Heart disease Father   . Kidney disease Father   . Diabetes Mother   . Seizures Sister   . Seizures Brother     BP (!) 162/110 (BP Location: Left Arm, Patient Position: Sitting, Cuff Size: Normal) Comment: Patient out of BP medication  Pulse (!) 112   Wt 143 lb 3.8 oz (65 kg)   SpO2 98%   BMI 22.43 kg/m    Review of Systems She denies hypoglycemia.      Objective:   Physical Exam VITAL SIGNS:  See vs page GENERAL: no distress Pulses: foot pulses are intact bilaterally.   MSK: no deformity of the feet or ankles.  CV: no edema of the legs or ankles Skin:  no ulcer on the feet or ankles.  normal color and temp on the feet and ankles Neuro: sensation is intact to touch on the feet and ankles, but decreased from normal   Lab Results  Component Value Date   HGBA1C 10.2 12/31/2017   Lab Results  Component Value Date   CREATININE 0.89 03/24/2017   BUN <5 (L) 03/24/2017   NA 138 03/24/2017   K 3.9 03/24/2017   CL  102 03/24/2017   CO2 26 03/24/2017      Assessment & Plan:  Type 1 DM, with polyneuropathy, worse: I would consider a faster-acting qd insulin, but a1c was 7.5 last time on just 2 more units, so we'll continue lantus for now.   HTN: is noted today  Patient Instructions  Your blood pressure is high today.  Please see your primary care provider soon, to have it rechecked. check your blood sugar twice a day.  vary the time of day when you check, between before the 3 meals, and at bedtime.  also check if you have symptoms of your blood sugar being too high or too low.  please keep a record of the readings and bring it to your next appointment here (or you can bring the meter itself).  You can write it on any piece of paper.  please call us sooner if your blood sugar goes below 70, or if you have a lot of readings over 200.   Please increase the lantus back up to 30 units each morning.   On this type of insulin schedule, you should eat meals on a regular schedule.  If a meal is missed or significantly delayed, your blood sugar could go low. Also, you may need to eat a light snack at bedtime, to keep it from going low in the early morning. Please come back for a follow-up appointment in 2 months.

## 2018-01-22 ENCOUNTER — Other Ambulatory Visit: Payer: Self-pay

## 2018-01-22 ENCOUNTER — Encounter (HOSPITAL_COMMUNITY): Payer: Self-pay | Admitting: Emergency Medicine

## 2018-01-22 ENCOUNTER — Emergency Department (HOSPITAL_COMMUNITY): Payer: Medicare Other

## 2018-01-22 ENCOUNTER — Emergency Department (HOSPITAL_COMMUNITY)
Admission: EM | Admit: 2018-01-22 | Discharge: 2018-01-22 | Disposition: A | Discharge status: Home / Self Care | Payer: Medicare Other | Attending: Emergency Medicine | Admitting: Emergency Medicine

## 2018-01-22 DIAGNOSIS — Z87891 Personal history of nicotine dependence: Secondary | ICD-10-CM | POA: Diagnosis not present

## 2018-01-22 DIAGNOSIS — E1140 Type 2 diabetes mellitus with diabetic neuropathy, unspecified: Secondary | ICD-10-CM | POA: Diagnosis not present

## 2018-01-22 DIAGNOSIS — R55 Syncope and collapse: Secondary | ICD-10-CM | POA: Insufficient documentation

## 2018-01-22 DIAGNOSIS — Z794 Long term (current) use of insulin: Secondary | ICD-10-CM | POA: Diagnosis not present

## 2018-01-22 DIAGNOSIS — N183 Chronic kidney disease, stage 3 (moderate): Secondary | ICD-10-CM | POA: Diagnosis not present

## 2018-01-22 DIAGNOSIS — G40909 Epilepsy, unspecified, not intractable, without status epilepticus: Secondary | ICD-10-CM | POA: Insufficient documentation

## 2018-01-22 DIAGNOSIS — Z79899 Other long term (current) drug therapy: Secondary | ICD-10-CM | POA: Insufficient documentation

## 2018-01-22 DIAGNOSIS — R4182 Altered mental status, unspecified: Secondary | ICD-10-CM | POA: Diagnosis not present

## 2018-01-22 DIAGNOSIS — I129 Hypertensive chronic kidney disease with stage 1 through stage 4 chronic kidney disease, or unspecified chronic kidney disease: Secondary | ICD-10-CM | POA: Insufficient documentation

## 2018-01-22 LAB — CBC
HCT: 31.5 % — ABNORMAL LOW (ref 36.0–46.0)
Hemoglobin: 10.6 g/dL — ABNORMAL LOW (ref 12.0–15.0)
MCH: 30.1 pg (ref 26.0–34.0)
MCHC: 33.7 g/dL (ref 30.0–36.0)
MCV: 89.5 fL (ref 78.0–100.0)
Platelets: 245 10*3/uL (ref 150–400)
RBC: 3.52 MIL/uL — ABNORMAL LOW (ref 3.87–5.11)
RDW: 13 % (ref 11.5–15.5)
WBC: 4.7 10*3/uL (ref 4.0–10.5)

## 2018-01-22 LAB — I-STAT CHEM 8, ED
BUN: 12 mg/dL (ref 6–20)
Calcium, Ion: 1.17 mmol/L (ref 1.15–1.40)
Chloride: 103 mmol/L (ref 101–111)
Creatinine, Ser: 0.8 mg/dL (ref 0.44–1.00)
Glucose, Bld: 152 mg/dL — ABNORMAL HIGH (ref 65–99)
HCT: 33 % — ABNORMAL LOW (ref 36.0–46.0)
Hemoglobin: 11.2 g/dL — ABNORMAL LOW (ref 12.0–15.0)
Potassium: 3.7 mmol/L (ref 3.5–5.1)
Sodium: 142 mmol/L (ref 135–145)
TCO2: 24 mmol/L (ref 22–32)

## 2018-01-22 LAB — URINALYSIS, ROUTINE W REFLEX MICROSCOPIC
Bilirubin Urine: NEGATIVE
Glucose, UA: 50 mg/dL — AB
Ketones, ur: NEGATIVE mg/dL
Nitrite: NEGATIVE
Protein, ur: NEGATIVE mg/dL
Specific Gravity, Urine: 1.015 (ref 1.005–1.030)
pH: 6 (ref 5.0–8.0)

## 2018-01-22 LAB — ETHANOL: Alcohol, Ethyl (B): <10 mg/dL (ref <10)

## 2018-01-22 LAB — DIFFERENTIAL
Basophils Absolute: 0 10*3/uL (ref 0.0–0.1)
Basophils Relative: 0 %
Eosinophils Absolute: 0 10*3/uL (ref 0.0–0.7)
Eosinophils Relative: 0 %
Lymphocytes Relative: 49 %
Lymphs Abs: 2.3 10*3/uL (ref 0.7–4.0)
Monocytes Absolute: 0.2 10*3/uL (ref 0.1–1.0)
Monocytes Relative: 5 %
Neutro Abs: 2.1 10*3/uL (ref 1.7–7.7)
Neutrophils Relative %: 46 %

## 2018-01-22 LAB — COMPREHENSIVE METABOLIC PANEL
ALT: 20 U/L (ref 14–54)
AST: 26 U/L (ref 15–41)
Albumin: 3.6 g/dL (ref 3.5–5.0)
Alkaline Phosphatase: 203 U/L — ABNORMAL HIGH (ref 38–126)
Anion gap: 11 (ref 5–15)
BUN: 11 mg/dL (ref 6–20)
CO2: 23 mmol/L (ref 22–32)
Calcium: 9.3 mg/dL (ref 8.9–10.3)
Chloride: 105 mmol/L (ref 101–111)
Creatinine, Ser: 0.91 mg/dL (ref 0.44–1.00)
GFR calc Af Amer: >60 mL/min (ref >60)
GFR calc non Af Amer: >60 mL/min (ref >60)
Glucose, Bld: 153 mg/dL — ABNORMAL HIGH (ref 65–99)
Potassium: 3.6 mmol/L (ref 3.5–5.1)
Sodium: 139 mmol/L (ref 135–145)
Total Bilirubin: 0.4 mg/dL (ref 0.3–1.2)
Total Protein: 8 g/dL (ref 6.5–8.1)

## 2018-01-22 LAB — I-STAT TROPONIN, ED: Troponin i, poc: 0 ng/mL (ref 0.00–0.08)

## 2018-01-22 LAB — RAPID URINE DRUG SCREEN, HOSP PERFORMED
Amphetamines: NOT DETECTED
Barbiturates: NOT DETECTED
Benzodiazepines: NOT DETECTED
Cocaine: NOT DETECTED
Opiates: NOT DETECTED
Tetrahydrocannabinol: NOT DETECTED

## 2018-01-22 LAB — PROTIME-INR
INR: 1.02
Prothrombin Time: 13.3 seconds (ref 11.4–15.2)

## 2018-01-22 LAB — APTT: aPTT: 31 seconds (ref 24–36)

## 2018-01-22 MED ORDER — IOPAMIDOL (ISOVUE-370) INJECTION 76%
INTRAVENOUS | Status: AC
  Administered 2018-01-22: 90 mL
  Filled 2018-01-22: qty 100

## 2018-01-22 NOTE — ED Notes (Signed)
Patient given discharge instructions and verbalized understanding.  Patient stable to discharge at this time.  Patient is alert and oriented to baseline.  No distressed noted at this time.  All belongings taken with the patient at discharge.   

## 2018-01-22 NOTE — ED Notes (Signed)
To CT scan

## 2018-01-22 NOTE — Discharge Instructions (Signed)
Tests showed no life-threatening condition.  Take your blood pressure medications.  Follow-up with your primary care doctor.
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
## 2018-01-22 NOTE — ED Triage Notes (Signed)
To ED via [facility] from home with family stating "pt was walking through living room, and went to the ground-- did not fall" pt has hx of seizures, family states this is how pt is post ictal- after seizure.
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
## 2018-01-22 NOTE — ED Provider Notes (Signed)
[facility] EMERGENCY DEPARTMENT Provider Note   CSN: [id number] Arrival date & time: 01/22/18  6314     History   Chief Complaint Chief Complaint  Patient presents with  . Altered Mental Status    HPI Tricia Huynh is a 62 y.o. female.  Level 5 caveat for obtundation.  History obtained from EMS.  Patient was "walking into the living room and then slumped to the floor".  She apparently been in a disagreement with her family.  She has a seizure disorder, but this was not her typical postictal behavior.  She would not speak or answer questions.      Past Medical History:  Diagnosis Date  . Diabetes mellitus type 2 in nonobese ([place]) 06/06/2015  . Diabetes mellitus without complication ([place])   . Diabetic neuropathy ([place]) 06/06/2015  . Dyslipidemia 06/06/2015  . Encephalopathy   . Essential hypertension 06/06/2015  . Hypertension   . Mood disorder ([place]) 06/06/2015  . Pneumonia 03/11/2017  . Seizure [facility])    sees [name]. abd eeg, on keppra    Patient Active Problem List   Diagnosis Date Noted  . HCAP (healthcare-associated pneumonia) 03/11/2017  . Dyslipidemia 03/11/2017  . Diabetes mellitus with complication ([place])   . Chest pain 12/30/2016  . Seizure disorder ([place]) 12/30/2016  . Diabetic ketoacidosis without coma associated with type 2 diabetes mellitus ([place])   . DKA (diabetic ketoacidoses) ([place]) 11/15/2016  . UTI (urinary tract infection) 11/15/2016  . Acute cystitis without hematuria   . Acute renal failure ([place])   . Hyperglycemia 11/14/2016  . Diabetes ([place]) 06/05/2016  . Altered mental status   . CKD (chronic kidney disease) stage 3, GFR 30-59 ml/min (HCC) 05/26/2016  . Benign essential HTN 05/26/2016  . Seizures ([place]) 01/08/2016  . Memory loss 10/04/2015  . Acute encephalopathy 06/06/2015  . Diabetic neuropathy ([place]) 06/06/2015    Past Surgical History:  Procedure Laterality Date  . ABDOMINAL HYSTERECTOMY    . EYE SURGERY Bilateral     . TUBAL LIGATION      OB History    No data available       Home Medications    Prior to Admission medications   Medication Sig Start Date End Date Taking? Authorizing Provider  acetaminophen (TYLENOL) 500 MG tablet Take 1,000 mg by mouth every 6 (six) hours as needed for mild pain.   Yes [provider]  amitriptyline (ELAVIL) 50 MG tablet Take 50 mg by mouth at bedtime. 05/08/15  Yes [provider]  amLODipine-olmesartan (AZOR) 5-40 MG tablet Take 1 tablet by mouth daily. 10/08/16  Yes [provider]  aspirin EC 81 MG tablet Take 81 mg by mouth daily.   Yes [provider]  atorvastatin (LIPITOR) 40 MG tablet Take 40 mg by mouth daily. 05/10/15  Yes [provider]  Insulin Glargine (LANTUS SOLOSTAR) 100 UNIT/ML Solostar Pen Inject 30 Units into the skin every morning. And pen needles 1/day 12/31/17  Yes Renato Shin, MD  levETIRAcetam (KEPPRA) 750 MG tablet Take 1 tablet (750 mg total) by mouth 2 (two) times daily. 05/26/17  Yes Marcial Pacas, MD  metoprolol succinate (TOPROL-XL) 25 MG 24 hr tablet Take 50 mg by mouth at bedtime. 05/08/15  Yes [provider]  vitamin E 400 UNIT capsule Take 400 Units by mouth daily.   Yes [provider]    Family History Family History  Problem Relation Age of Onset  . Heart disease Father   . Kidney  disease Father   . Diabetes Mother   . Seizures Sister   . Seizures Brother     Social History Social History   Tobacco Use  . Smoking status: Former Research scientist (life sciences)  . Smokeless tobacco: Never Used  Substance Use Topics  . Alcohol use: No  . Drug use: No     Allergies   Percocet [oxycodone-acetaminophen] and Vicodin [hydrocodone-acetaminophen]   Review of Systems Review of Systems  Unable to perform ROS: Acuity of condition     Physical Exam Updated Vital Signs BP (!) 174/112   Pulse 97   Temp 98.3 F (36.8 C) (Oral)   Resp 18   Wt 67.6 kg (149 lb 0.5 oz)   SpO2 99%    BMI 23.34 kg/m   Physical Exam  Constitutional:  Patient would not speak or answer questions.  HENT:  Head: Normocephalic and atraumatic.  Eyes: Conjunctivae are normal.  Neck: Neck supple.  Cardiovascular: Normal rate and regular rhythm.  Pulmonary/Chest: Effort normal and breath sounds normal.  Abdominal: Soft. Bowel sounds are normal.  Musculoskeletal:  Unable  Neurological:  Obtunded  Skin: Skin is warm and dry.  Psychiatric:  Unable  Nursing note and vitals reviewed.    ED Treatments / Results  Labs (all labs ordered are listed, but only abnormal results are displayed) Labs Reviewed  CBC - Abnormal; Notable for the following components:      Result Value   RBC 3.52 (*)    Hemoglobin 10.6 (*)    HCT 31.5 (*)    All other components within normal limits  COMPREHENSIVE METABOLIC PANEL - Abnormal; Notable for the following components:   Glucose, Bld 153 (*)    Alkaline Phosphatase 203 (*)    All other components within normal limits  URINALYSIS, ROUTINE W REFLEX MICROSCOPIC - Abnormal; Notable for the following components:   APPearance HAZY (*)    Glucose, UA 50 (*)    Hgb urine dipstick MODERATE (*)    Leukocytes, UA LARGE (*)    Bacteria, UA FEW (*)    Squamous Epithelial / LPF 0-5 (*)    All other components within normal limits  I-STAT CHEM 8, ED - Abnormal; Notable for the following components:   Glucose, Bld 152 (*)    Hemoglobin 11.2 (*)    HCT 33.0 (*)    All other components within normal limits  ETHANOL  PROTIME-INR  APTT  DIFFERENTIAL  RAPID URINE DRUG SCREEN, HOSP PERFORMED  I-STAT TROPONIN, ED    EKG  EKG Interpretation  Date/Time:  Friday January 22 2018 09:34:34 EST Ventricular Rate:  112 PR Interval:    QRS Duration: 85 QT Interval:  336 QTC Calculation: 459 R Axis:   -22 Text Interpretation:  Sinus tachycardia Borderline left axis deviation Abnormal R-wave progression, early transition Confirmed by Nat Christen [phone]) on 01/22/2018  2:34:44 PM       Radiology Ct Angio Head W Or Wo Contrast  Result Date: 01/22/2018 CLINICAL DATA:  62 year old female with altered mental status, nonverbal. Code stroke presentation. EXAM: CT ANGIOGRAPHY HEAD AND NECK CT PERFUSION BRAIN TECHNIQUE: Multidetector CT imaging of the head and neck was performed using the standard protocol during bolus administration of intravenous contrast. Multiplanar CT image reconstructions and MIPs were obtained to evaluate the vascular anatomy. Carotid stenosis measurements (when applicable) are obtained utilizing NASCET criteria, using the distal internal carotid diameter as the denominator. Multiphase CT imaging of the brain was performed following IV bolus contrast injection. Subsequent parametric perfusion  maps were calculated using RAPID software. CONTRAST:  14mL ISOVUE-370 IOPAMIDOL (ISOVUE-370) INJECTION 76% COMPARISON:  Head CT without contrast 0953 hours today. FINDINGS: CT Brain Perfusion Findings: CBF (<30%) Volume: None. Perfusion (Tmax>6.0s) volume: None. Mismatch Volume: None. Infarction Location:Not applicable CTA NECK Skeleton: Absent dentition. Cervical spine degeneration with reversal of lordosis. No acute osseous abnormality identified. Upper chest: No superior mediastinal lymphadenopathy. Mild mosaic attenuation in the upper lungs likely due to atelectasis. Other neck: Negative. The glottis is closed. No neck mass or lymphadenopathy. Aortic arch: 3 vessel arch configuration. Minimal atherosclerosis in the distal arch. Normal great vessel origins. Right carotid system: Tortuous brachiocephalic artery and proximal right CCA without stenosis. Negative right carotid bifurcation. Mildly tortuous cervical right ICA. Left carotid system: Mildly tortuous proximal left CCA without stenosis. Negative left carotid bifurcation. Mildly to moderately tortuous cervical left ICA. Vertebral arteries: Mildly tortuous proximal right subclavian artery without plaque or  stenosis. Normal right vertebral artery origin. Tortuous right vertebral artery is patent to the skull base without stenosis. No proximal left subclavian artery plaque or stenosis. Normal left vertebral artery origin. Tortuous left V1 and V2 segments. No left vertebral artery stenosis to the skull base. CTA HEAD Posterior circulation: Patent distal vertebral arteries without stenosis. The distal left vertebral artery is dominant despite fairly codominant appearance in the neck. Normal PICA origins. Patent vertebrobasilar junction. Mildly tortuous basilar artery without stenosis. Patent AICA and SCA origins. Normal PCA origins. Tortuous left P1 segment. A small left posterior communicating artery is identified. The right is diminutive. Bilateral PCA branches are within normal limits. Anterior circulation: Both ICA siphons are patent with no plaque or stenosis. Ophthalmic and posterior communicating artery origins are normal. There is a small infundibulum of the left ophthalmic artery origin (normal variant). Patent carotid termini. Normal MCA and ACA origins. Mildly dominant left A1 segment. The right A1 segment and the anterior communicating artery appear to be fenestrated (normal variant). Bilateral ACA branches are normal aside from mild tortuosity. The left MCA M1 segment, bifurcation, and left MCA branches are within normal limits. The right MCA M1 segment, bifurcation, and right MCA branches are within normal limits. Venous sinuses: Patent. Anatomic variants: Dominant distal left vertebral artery. Mildly dominant left A1 segment. Fenestrated right A1 and anterior communicating artery. Small infundibulum at the left ophthalmic artery origin. Review of the MIP images confirms the above findings IMPRESSION: 1. Negative for emergent large vessel occlusion. CT Perfusion detects no ischemia or core infarct. 2. Negative CTA Head and neck aside from arterial tortuosity. No atherosclerosis or stenosis identified.  Electronically Signed   By: Genevie Ann M.D.   On: 01/22/2018 10:33   Ct Angio Neck W Or Wo Contrast  Result Date: 01/22/2018 CLINICAL DATA:  62 year old female with altered mental status, nonverbal. Code stroke presentation. EXAM: CT ANGIOGRAPHY HEAD AND NECK CT PERFUSION BRAIN TECHNIQUE: Multidetector CT imaging of the head and neck was performed using the standard protocol during bolus administration of intravenous contrast. Multiplanar CT image reconstructions and MIPs were obtained to evaluate the vascular anatomy. Carotid stenosis measurements (when applicable) are obtained utilizing NASCET criteria, using the distal internal carotid diameter as the denominator. Multiphase CT imaging of the brain was performed following IV bolus contrast injection. Subsequent parametric perfusion maps were calculated using RAPID software. CONTRAST:  68mL ISOVUE-370 IOPAMIDOL (ISOVUE-370) INJECTION 76% COMPARISON:  Head CT without contrast 0953 hours today. FINDINGS: CT Brain Perfusion Findings: CBF (<30%) Volume: None. Perfusion (Tmax>6.0s) volume: None. Mismatch Volume: None. Infarction Location:Not applicable  CTA NECK Skeleton: Absent dentition. Cervical spine degeneration with reversal of lordosis. No acute osseous abnormality identified. Upper chest: No superior mediastinal lymphadenopathy. Mild mosaic attenuation in the upper lungs likely due to atelectasis. Other neck: Negative. The glottis is closed. No neck mass or lymphadenopathy. Aortic arch: 3 vessel arch configuration. Minimal atherosclerosis in the distal arch. Normal great vessel origins. Right carotid system: Tortuous brachiocephalic artery and proximal right CCA without stenosis. Negative right carotid bifurcation. Mildly tortuous cervical right ICA. Left carotid system: Mildly tortuous proximal left CCA without stenosis. Negative left carotid bifurcation. Mildly to moderately tortuous cervical left ICA. Vertebral arteries: Mildly tortuous proximal right  subclavian artery without plaque or stenosis. Normal right vertebral artery origin. Tortuous right vertebral artery is patent to the skull base without stenosis. No proximal left subclavian artery plaque or stenosis. Normal left vertebral artery origin. Tortuous left V1 and V2 segments. No left vertebral artery stenosis to the skull base. CTA HEAD Posterior circulation: Patent distal vertebral arteries without stenosis. The distal left vertebral artery is dominant despite fairly codominant appearance in the neck. Normal PICA origins. Patent vertebrobasilar junction. Mildly tortuous basilar artery without stenosis. Patent AICA and SCA origins. Normal PCA origins. Tortuous left P1 segment. A small left posterior communicating artery is identified. The right is diminutive. Bilateral PCA branches are within normal limits. Anterior circulation: Both ICA siphons are patent with no plaque or stenosis. Ophthalmic and posterior communicating artery origins are normal. There is a small infundibulum of the left ophthalmic artery origin (normal variant). Patent carotid termini. Normal MCA and ACA origins. Mildly dominant left A1 segment. The right A1 segment and the anterior communicating artery appear to be fenestrated (normal variant). Bilateral ACA branches are normal aside from mild tortuosity. The left MCA M1 segment, bifurcation, and left MCA branches are within normal limits. The right MCA M1 segment, bifurcation, and right MCA branches are within normal limits. Venous sinuses: Patent. Anatomic variants: Dominant distal left vertebral artery. Mildly dominant left A1 segment. Fenestrated right A1 and anterior communicating artery. Small infundibulum at the left ophthalmic artery origin. Review of the MIP images confirms the above findings IMPRESSION: 1. Negative for emergent large vessel occlusion. CT Perfusion detects no ischemia or core infarct. 2. Negative CTA Head and neck aside from arterial tortuosity. No  atherosclerosis or stenosis identified. Electronically Signed   By: Genevie Ann M.D.   On: 01/22/2018 10:33   Ct Cerebral Perfusion W Contrast  Result Date: 01/22/2018 CLINICAL DATA:  62 year old female with altered mental status, nonverbal. Code stroke presentation. EXAM: CT ANGIOGRAPHY HEAD AND NECK CT PERFUSION BRAIN TECHNIQUE: Multidetector CT imaging of the head and neck was performed using the standard protocol during bolus administration of intravenous contrast. Multiplanar CT image reconstructions and MIPs were obtained to evaluate the vascular anatomy. Carotid stenosis measurements (when applicable) are obtained utilizing NASCET criteria, using the distal internal carotid diameter as the denominator. Multiphase CT imaging of the brain was performed following IV bolus contrast injection. Subsequent parametric perfusion maps were calculated using RAPID software. CONTRAST:  86mL ISOVUE-370 IOPAMIDOL (ISOVUE-370) INJECTION 76% COMPARISON:  Head CT without contrast 0953 hours today. FINDINGS: CT Brain Perfusion Findings: CBF (<30%) Volume: None. Perfusion (Tmax>6.0s) volume: None. Mismatch Volume: None. Infarction Location:Not applicable CTA NECK Skeleton: Absent dentition. Cervical spine degeneration with reversal of lordosis. No acute osseous abnormality identified. Upper chest: No superior mediastinal lymphadenopathy. Mild mosaic attenuation in the upper lungs likely due to atelectasis. Other neck: Negative. The glottis is closed. No neck mass  or lymphadenopathy. Aortic arch: 3 vessel arch configuration. Minimal atherosclerosis in the distal arch. Normal great vessel origins. Right carotid system: Tortuous brachiocephalic artery and proximal right CCA without stenosis. Negative right carotid bifurcation. Mildly tortuous cervical right ICA. Left carotid system: Mildly tortuous proximal left CCA without stenosis. Negative left carotid bifurcation. Mildly to moderately tortuous cervical left ICA. Vertebral  arteries: Mildly tortuous proximal right subclavian artery without plaque or stenosis. Normal right vertebral artery origin. Tortuous right vertebral artery is patent to the skull base without stenosis. No proximal left subclavian artery plaque or stenosis. Normal left vertebral artery origin. Tortuous left V1 and V2 segments. No left vertebral artery stenosis to the skull base. CTA HEAD Posterior circulation: Patent distal vertebral arteries without stenosis. The distal left vertebral artery is dominant despite fairly codominant appearance in the neck. Normal PICA origins. Patent vertebrobasilar junction. Mildly tortuous basilar artery without stenosis. Patent AICA and SCA origins. Normal PCA origins. Tortuous left P1 segment. A small left posterior communicating artery is identified. The right is diminutive. Bilateral PCA branches are within normal limits. Anterior circulation: Both ICA siphons are patent with no plaque or stenosis. Ophthalmic and posterior communicating artery origins are normal. There is a small infundibulum of the left ophthalmic artery origin (normal variant). Patent carotid termini. Normal MCA and ACA origins. Mildly dominant left A1 segment. The right A1 segment and the anterior communicating artery appear to be fenestrated (normal variant). Bilateral ACA branches are normal aside from mild tortuosity. The left MCA M1 segment, bifurcation, and left MCA branches are within normal limits. The right MCA M1 segment, bifurcation, and right MCA branches are within normal limits. Venous sinuses: Patent. Anatomic variants: Dominant distal left vertebral artery. Mildly dominant left A1 segment. Fenestrated right A1 and anterior communicating artery. Small infundibulum at the left ophthalmic artery origin. Review of the MIP images confirms the above findings IMPRESSION: 1. Negative for emergent large vessel occlusion. CT Perfusion detects no ischemia or core infarct. 2. Negative CTA Head and neck  aside from arterial tortuosity. No atherosclerosis or stenosis identified. Electronically Signed   By: Genevie Ann M.D.   On: 01/22/2018 10:33   Ct Head Code Stroke Wo Contrast  Result Date: 01/22/2018 CLINICAL DATA:  Code stroke. 62 year old female with unexplained altered mental status. EXAM: CT HEAD WITHOUT CONTRAST TECHNIQUE: Contiguous axial images were obtained from the base of the skull through the vertex without intravenous contrast. COMPARISON:  Brain MRI and head CT 05/26/2016. FINDINGS: Brain: Cerebral volume is stable since 2017. No midline shift, ventriculomegaly, mass effect, evidence of mass lesion, intracranial hemorrhage or evidence of cortically based acute infarction. Gray-white matter differentiation is within normal limits throughout the brain. No cortical encephalomalacia identified. Vascular: No suspicious intracranial vascular hyperdensity. Skull: Osteopenia.  No acute osseous abnormality identified. Sinuses/Orbits: Stable ethmoid mucosal thickening.  Clear otherwise. Other: Chronic posterior left scalp nonspecific fluid collection, such as seroma, is stable. No acute scalp or orbits soft tissue findings. ASPECTS ([place] Stroke Program Early CT Score) - Ganglionic level infarction (caudate, lentiform nuclei, internal capsule, insula, M1-M3 cortex): 7 - Supraganglionic infarction (M4-M6 cortex): 3 Total score (0-10 with 10 being normal): 10 IMPRESSION: 1. Stable and negative noncontrast CT appearance of the brain. 2. ASPECTS is 10. 3. These results were communicated to Dr. Leonel Ramsay at [place] 3/8/2019by text page via the [facility] messaging system. Electronically Signed   By: Genevie Ann M.D.   On: 01/22/2018 10:00    Procedures Procedures (including critical care time)  Medications  Ordered in ED Medications  iopamidol (ISOVUE-370) 76 % injection (90 mLs  Contrast Given 01/22/18 1000)     Initial Impression / Assessment and Plan / ED Course  I have reviewed the triage vital signs and  the nursing notes.  Pertinent labs & imaging results that were available during my care of the patient were reviewed by me and considered in my medical decision making (see chart for details).     Presents via EMS after slumping to the floor at home.  She would not respond to direct commands in the emergency department.  A code stroke was initiated.  CT scan of head showed no acute findings.  Neurologist consult obtained.  Further history obtained from family.  These episodes often happen after family stress.  See full neurology consultation.  She was observed for multiple hours in the emergency department and appeared normal.  Upon discharge she was alert and oriented x3 with full range of motion of arms and legs.   CRITICAL CARE Performed by: Nat Christen Total critical care time: 30 minutes Critical care time was exclusive of separately billable procedures and treating other patients. Critical care was necessary to treat or prevent imminent or life-threatening deterioration. Critical care was time spent personally by me on the following activities: development of treatment plan with patient and/or surrogate as well as nursing, discussions with consultants, evaluation of patient's response to treatment, examination of patient, obtaining history from patient or surrogate, ordering and performing treatments and interventions, ordering and review of laboratory studies, ordering and review of radiographic studies, pulse oximetry and re-evaluation of patient's condition.  Final Clinical Impressions(s) / ED Diagnoses   Final diagnoses:  Altered mental status, unspecified altered mental status type    ED Discharge Orders    None       Nat Christen, MD 01/22/18 [phone]

## 2018-03-01 ENCOUNTER — Encounter: Payer: Self-pay | Admitting: Endocrinology

## 2018-03-01 ENCOUNTER — Ambulatory Visit (INDEPENDENT_AMBULATORY_CARE_PROVIDER_SITE_OTHER): Payer: Medicare Other | Admitting: Endocrinology

## 2018-03-01 VITALS — BP 158/102 | HR 98 | Temp 98.4°F | Ht 67.0 in | Wt 151.0 lb

## 2018-03-01 DIAGNOSIS — E119 Type 2 diabetes mellitus without complications: Secondary | ICD-10-CM | POA: Diagnosis not present

## 2018-03-01 LAB — POCT GLYCOSYLATED HEMOGLOBIN (HGB A1C): Hemoglobin A1C: 7.7

## 2018-03-01 NOTE — Progress Notes (Signed)
Subjective:    Patient ID: Tricia Huynh, female    DOB: 1956/04/08, 62 y.o.   MRN: [id number]  HPI Pt returns for f/u of diabetes mellitus: DM type: 1 Dx'ed: 4431 Complications: polyneuropathy Therapy: insulin since 2010.   GDM: never DKA: once, in late 2017 Severe hypoglycemia: once, in 2017.  Pancreatitis: never.  Other: She takes QD insulin, due to noncompliance; insurance declined V-GO.  Interval history: She says she never misses the insulin.  no cbg record, but states cbg's vary from 60-204.  It is in general higher as the day goes on.  pt states she feels well in general.  Past Medical History:  Diagnosis Date  . Diabetes mellitus type 2 in nonobese ([place]) 06/06/2015  . Diabetes mellitus without complication ([place])   . Diabetic neuropathy ([place]) 06/06/2015  . Dyslipidemia 06/06/2015  . Encephalopathy   . Essential hypertension 06/06/2015  . Hypertension   . Mood disorder ([place]) 06/06/2015  . Pneumonia 03/11/2017  . Seizure [facility])    sees [name]. abd eeg, on keppra    Past Surgical History:  Procedure Laterality Date  . ABDOMINAL HYSTERECTOMY    . EYE SURGERY Bilateral   . TUBAL LIGATION      Social History   Socioeconomic History  . Marital status: Single    Spouse name: Not on file  . Number of children: 3  . Years of education: 10  . Highest education level: Not on file  Occupational History  . Occupation: Disabled  Social Needs  . Financial resource strain: Not on file  . Food insecurity:    Worry: Not on file    Inability: Not on file  . Transportation needs:    Medical: Not on file    Non-medical: Not on file  Tobacco Use  . Smoking status: Former Research scientist (life sciences)  . Smokeless tobacco: Never Used  Substance and Sexual Activity  . Alcohol use: No  . Drug use: No  . Sexual activity: Yes  Lifestyle  . Physical activity:    Days per week: Not on file    Minutes per session: Not on file  . Stress: Not on file  Relationships  . Social connections:   Talks on phone: Not on file    Gets together: Not on file    Attends religious service: Not on file    Active member of club or organization: Not on file    Attends meetings of clubs or organizations: Not on file    Relationship status: Not on file  . Intimate partner violence:    Fear of current or ex partner: Not on file    Emotionally abused: Not on file    Physically abused: Not on file    Forced sexual activity: Not on file  Other Topics Concern  . Not on file  Social History Narrative   Lives at home with her grandchildren.   Right-handed.   No caffeine use.    Current Outpatient Medications on File Prior to Visit  Medication Sig Dispense Refill  . acetaminophen (TYLENOL) 500 MG tablet Take 1,000 mg by mouth every 6 (six) hours as needed for mild pain.    Marland Kitchen amitriptyline (ELAVIL) 50 MG tablet Take 50 mg by mouth at bedtime.  1  . amLODipine-olmesartan (AZOR) 5-40 MG tablet Take 1 tablet by mouth daily.    Marland Kitchen aspirin EC 81 MG tablet Take 81 mg by mouth daily.    Marland Kitchen atorvastatin (LIPITOR) 40 MG tablet Take 40 mg by  mouth daily.  1  . Insulin Glargine (LANTUS SOLOSTAR) 100 UNIT/ML Solostar Pen Inject 30 Units into the skin every morning. And pen needles 1/day 5 pen PRN  . levETIRAcetam (KEPPRA) 750 MG tablet Take 1 tablet (750 mg total) by mouth 2 (two) times daily. 60 tablet 6  . metoprolol succinate (TOPROL-XL) 25 MG 24 hr tablet Take 50 mg by mouth at bedtime.  1  . vitamin E 400 UNIT capsule Take 400 Units by mouth daily.     No current facility-administered medications on file prior to visit.     Allergies  Allergen Reactions  . Percocet [Oxycodone-Acetaminophen] Nausea And Vomiting and Other (See Comments)    Shaky/unsteady.  . Vicodin [Hydrocodone-Acetaminophen] Nausea And Vomiting and Other (See Comments)    Shaky/unsteady.    Family History  Problem Relation Age of Onset  . Heart disease Father   . Kidney disease Father   . Diabetes Mother   . Seizures Sister    . Seizures Brother     BP (!) 158/102 (BP Location: Right Arm, Patient Position: Sitting, Cuff Size: Normal)   Pulse 98   Temp 98.4 F (36.9 C) (Oral)   Ht 5\' 7"  (1.702 m)   Wt 151 lb (68.5 kg)   SpO2 99%   BMI 23.65 kg/m    Review of Systems Denies LOC    Objective:   Physical Exam VITAL SIGNS:  See vs page GENERAL: no distress Pulses: dorsalis pedis intact bilat.   MSK: no deformity of the feet CV: no leg edema Skin:  no ulcer on the feet.  normal color and temp on the feet. Neuro: sensation is intact to touch on the feet, but decreased from normal.    Lab Results  Component Value Date   HGBA1C 7.7 03/01/2018   Lab Results  Component Value Date   CREATININE 0.80 01/22/2018   BUN 12 01/22/2018   NA 142 01/22/2018   K 3.7 01/22/2018   CL 103 01/22/2018   CO2 23 01/22/2018       Assessment & Plan:  Type 1 DM, with polyneuropathy: this is the best control this pt should aim for, given this regimen, which does match insulin to her changing needs throughout the day HTN: is noted today.  Patient Instructions  Your blood pressure is high today.  Please see your primary care provider soon, to have it rechecked. check your blood sugar twice a day.  vary the time of day when you check, between before the 3 meals, and at bedtime.  also check if you have symptoms of your blood sugar being too high or too low.  please keep a record of the readings and bring it to your next appointment here (or you can bring the meter itself).  You can write it on any piece of paper.  please call us sooner if your blood sugar goes below 70, or if you have a lot of readings over 200.   Please continue the same medications  On this type of insulin schedule, you should eat meals on a regular schedule.  If a meal is missed or significantly delayed, your blood sugar could go low. Also, you may need to eat a light snack at bedtime, to keep it from going low in the early morning.   Please come back  for a follow-up appointment in 2 months.

## 2018-03-01 NOTE — Patient Instructions (Addendum)
Your blood pressure is high today.  Please see your primary care provider soon, to have it rechecked. check your blood sugar twice a day.  vary the time of day when you check, between before the 3 meals, and at bedtime.  also check if you have symptoms of your blood sugar being too high or too low.  please keep a record of the readings and bring it to your next appointment here (or you can bring the meter itself).  You can write it on any piece of paper.  please call us sooner if your blood sugar goes below 70, or if you have a lot of readings over 200.   Please continue the same medications  On this type of insulin schedule, you should eat meals on a regular schedule.  If a meal is missed or significantly delayed, your blood sugar could go low. Also, you may need to eat a light snack at bedtime, to keep it from going low in the early morning.   Please come back for a follow-up appointment in 2 months.

## 2018-03-14 ENCOUNTER — Other Ambulatory Visit: Payer: Self-pay

## 2018-03-14 ENCOUNTER — Encounter (HOSPITAL_COMMUNITY): Payer: Self-pay

## 2018-03-14 ENCOUNTER — Emergency Department (HOSPITAL_COMMUNITY)
Admission: EM | Admit: 2018-03-14 | Discharge: 2018-03-14 | Disposition: A | Discharge status: Home / Self Care | Payer: Medicare Other | Attending: Emergency Medicine | Admitting: Emergency Medicine

## 2018-03-14 DIAGNOSIS — E1140 Type 2 diabetes mellitus with diabetic neuropathy, unspecified: Secondary | ICD-10-CM | POA: Insufficient documentation

## 2018-03-14 DIAGNOSIS — R42 Dizziness and giddiness: Secondary | ICD-10-CM | POA: Insufficient documentation

## 2018-03-14 DIAGNOSIS — F430 Acute stress reaction: Secondary | ICD-10-CM | POA: Insufficient documentation

## 2018-03-14 DIAGNOSIS — I129 Hypertensive chronic kidney disease with stage 1 through stage 4 chronic kidney disease, or unspecified chronic kidney disease: Secondary | ICD-10-CM | POA: Insufficient documentation

## 2018-03-14 DIAGNOSIS — E1122 Type 2 diabetes mellitus with diabetic chronic kidney disease: Secondary | ICD-10-CM | POA: Diagnosis not present

## 2018-03-14 DIAGNOSIS — N183 Chronic kidney disease, stage 3 (moderate): Secondary | ICD-10-CM | POA: Insufficient documentation

## 2018-03-14 DIAGNOSIS — Z87891 Personal history of nicotine dependence: Secondary | ICD-10-CM | POA: Diagnosis not present

## 2018-03-14 DIAGNOSIS — F4320 Adjustment disorder, unspecified: Secondary | ICD-10-CM

## 2018-03-14 LAB — CBG MONITORING, ED: Glucose-Capillary: 217 mg/dL — ABNORMAL HIGH (ref 65–99)

## 2018-03-14 MED ORDER — AMLODIPINE BESYLATE 5 MG PO TABS
5.0000 mg | ORAL_TABLET | Freq: Once | ORAL | Status: AC
  Administered 2018-03-14: 5 mg via ORAL
  Filled 2018-03-14: qty 1

## 2018-03-14 MED ORDER — METOPROLOL SUCCINATE ER 50 MG PO TB24
50.0000 mg | ORAL_TABLET | Freq: Every day | ORAL | Status: DC
  Administered 2018-03-14: 50 mg via ORAL
  Filled 2018-03-14: qty 1

## 2018-03-14 NOTE — ED Notes (Signed)
Dr. Randal Buba aware of patient's BP
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
## 2018-03-14 NOTE — Discharge Instructions (Addendum)
Your were given your morning blood pressure medications in the ER this morning. Take your additional morning medications when you get home.
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
## 2018-04-30 ENCOUNTER — Ambulatory Visit: Payer: Medicare Other | Admitting: Podiatry

## 2018-05-04 ENCOUNTER — Emergency Department (HOSPITAL_COMMUNITY): Payer: 59

## 2018-05-04 ENCOUNTER — Emergency Department (HOSPITAL_COMMUNITY)
Admission: EM | Admit: 2018-05-04 | Discharge: 2018-05-04 | Disposition: A | Discharge status: Home / Self Care | Payer: 59 | Attending: Emergency Medicine | Admitting: Emergency Medicine

## 2018-05-04 ENCOUNTER — Encounter (HOSPITAL_COMMUNITY): Payer: Self-pay | Admitting: *Deleted

## 2018-05-04 DIAGNOSIS — J069 Acute upper respiratory infection, unspecified: Secondary | ICD-10-CM | POA: Insufficient documentation

## 2018-05-04 DIAGNOSIS — E119 Type 2 diabetes mellitus without complications: Secondary | ICD-10-CM | POA: Insufficient documentation

## 2018-05-04 DIAGNOSIS — Z79899 Other long term (current) drug therapy: Secondary | ICD-10-CM | POA: Insufficient documentation

## 2018-05-04 DIAGNOSIS — Z87891 Personal history of nicotine dependence: Secondary | ICD-10-CM | POA: Diagnosis not present

## 2018-05-04 DIAGNOSIS — Z794 Long term (current) use of insulin: Secondary | ICD-10-CM | POA: Diagnosis not present

## 2018-05-04 DIAGNOSIS — N183 Chronic kidney disease, stage 3 (moderate): Secondary | ICD-10-CM | POA: Diagnosis not present

## 2018-05-04 DIAGNOSIS — R509 Fever, unspecified: Secondary | ICD-10-CM | POA: Diagnosis present

## 2018-05-04 DIAGNOSIS — I129 Hypertensive chronic kidney disease with stage 1 through stage 4 chronic kidney disease, or unspecified chronic kidney disease: Secondary | ICD-10-CM | POA: Diagnosis not present

## 2018-05-04 LAB — INFLUENZA PANEL BY PCR (TYPE A & B)
Influenza A By PCR: NEGATIVE
Influenza B By PCR: NEGATIVE

## 2018-05-04 LAB — GROUP A STREP BY PCR: Group A Strep by PCR: NOT DETECTED

## 2018-05-04 MED ORDER — ACETAMINOPHEN 325 MG PO TABS
650.0000 mg | ORAL_TABLET | Freq: Once | ORAL | Status: AC
  Administered 2018-05-04: 650 mg via ORAL
  Filled 2018-05-04: qty 2

## 2018-05-04 NOTE — ED Notes (Signed)
EDP at bedside  

## 2018-05-04 NOTE — ED Provider Notes (Signed)
[facility] EMERGENCY DEPARTMENT Provider Note   CSN: [id number] Arrival date & time: 05/04/18  1234     History   Chief Complaint Chief Complaint  Patient presents with  . Fever    HPI Tricia Huynh is a 62 y.o. female.  HPI   62 year old female presents today with complaints of respiratory infection.  She notes yesterday she developed body aches, dry nonproductive cough, mild sore throat and subjective fevers.  She notes she has been fatigued over the last day.  She notes the sore throat has gone away, cough has remained nonproductive, she denies any shortness of breath or chest pain.  She denies any abdominal pain nausea vomiting or diarrhea.  Patient denies any rashes or insect bites.  Notes that her grandson is sick with similar symptoms.  No medications prior to arrival.   Past Medical History:  Diagnosis Date  . Diabetes mellitus type 2 in nonobese ([place]) 06/06/2015  . Diabetes mellitus without complication ([place])   . Diabetic neuropathy ([place]) 06/06/2015  . Dyslipidemia 06/06/2015  . Encephalopathy   . Essential hypertension 06/06/2015  . Hypertension   . Mood disorder ([place]) 06/06/2015  . Pneumonia 03/11/2017  . Seizure [facility])    sees [name]. abd eeg, on keppra    Patient Active Problem List   Diagnosis Date Noted  . HCAP (healthcare-associated pneumonia) 03/11/2017  . Dyslipidemia 03/11/2017  . Diabetes mellitus with complication ([place])   . Chest pain 12/30/2016  . Seizure disorder ([place]) 12/30/2016  . Diabetic ketoacidosis without coma associated with type 2 diabetes mellitus ([place])   . DKA (diabetic ketoacidoses) ([place]) 11/15/2016  . UTI (urinary tract infection) 11/15/2016  . Acute cystitis without hematuria   . Acute renal failure ([place])   . Hyperglycemia 11/14/2016  . Diabetes ([place]) 06/05/2016  . Altered mental status   . CKD (chronic kidney disease) stage 3, GFR 30-59 ml/min (HCC) 05/26/2016  . Benign essential HTN 05/26/2016  .  Seizures ([place]) 01/08/2016  . Memory loss 10/04/2015  . Acute encephalopathy 06/06/2015  . Diabetic neuropathy ([place]) 06/06/2015    Past Surgical History:  Procedure Laterality Date  . ABDOMINAL HYSTERECTOMY    . EYE SURGERY Bilateral   . TUBAL LIGATION       OB History   None      Home Medications    Prior to Admission medications   Medication Sig Start Date End Date Taking? Authorizing Provider  acetaminophen (TYLENOL) 500 MG tablet Take 1,000 mg by mouth every 6 (six) hours as needed for mild pain.    [provider]  amitriptyline (ELAVIL) 50 MG tablet Take 50 mg by mouth at bedtime. 05/08/15   [provider]  amLODipine-olmesartan (AZOR) 5-40 MG tablet Take 1 tablet by mouth daily. 10/08/16   [provider]  aspirin EC 81 MG tablet Take 81 mg by mouth daily.    [provider]  atorvastatin (LIPITOR) 40 MG tablet Take 40 mg by mouth daily. 05/10/15   [provider]  Insulin Glargine (LANTUS SOLOSTAR) 100 UNIT/ML Solostar Pen Inject 30 Units into the skin every morning. And pen needles 1/day 12/31/17   Renato Shin, MD  levETIRAcetam (KEPPRA) 750 MG tablet Take 1 tablet (750 mg total) by mouth 2 (two) times daily. 05/26/17   Marcial Pacas, MD  metoprolol succinate (TOPROL-XL) 25 MG 24 hr tablet Take 50 mg by mouth at bedtime. 05/08/15   [provider]  vitamin E 400 UNIT capsule Take 400 Units  by mouth daily.    [provider]    Family History Family History  Problem Relation Age of Onset  . Heart disease Father   . Kidney disease Father   . Diabetes Mother   . Seizures Sister   . Seizures Brother     Social History Social History   Tobacco Use  . Smoking status: Former Research scientist (life sciences)  . Smokeless tobacco: Never Used  Substance Use Topics  . Alcohol use: No  . Drug use: No     Allergies   Percocet [oxycodone-acetaminophen] and Vicodin [hydrocodone-acetaminophen]   Review of Systems Review of Systems    All other systems reviewed and are negative.    Physical Exam Updated Vital Signs BP (!) 170/96 (BP Location: Right Arm)   Pulse (!) 103   Temp 99.9 F (37.7 C) (Oral)   Resp 20   SpO2 100%   Physical Exam  Constitutional: She is oriented to person, place, and time. She appears well-developed and well-nourished.  HENT:  Head: Normocephalic and atraumatic.  No oropharyngeal erythema edema, no tonsillar swelling or exudate, uvula midline rises with phonation no pooling of secretions -bilateral TMs impacted with cerumen  Eyes: Pupils are equal, round, and reactive to light. Conjunctivae are normal. Right eye exhibits no discharge. Left eye exhibits no discharge. No scleral icterus.  Neck: Normal range of motion. No JVD present. No tracheal deviation present.  Pulmonary/Chest: Effort normal. No stridor.  Neurological: She is alert and oriented to person, place, and time. Coordination normal.  Psychiatric: She has a normal mood and affect. Her behavior is normal. Judgment and thought content normal.  Nursing note and vitals reviewed.    ED Treatments / Results  Labs (all labs ordered are listed, but only abnormal results are displayed) Labs Reviewed  GROUP A STREP BY PCR  INFLUENZA PANEL BY PCR (TYPE A & B)    EKG None  Radiology Dg Chest 2 View  Result Date: 05/04/2018 CLINICAL DATA:  Right-sided chest pain, shortness of breath, fever, and nausea and vomiting for the past day. Former smoker. EXAM: CHEST - 2 VIEW COMPARISON:  Chest x-ray dated Mar 28, 2017 FINDINGS: The lungs are well-expanded and clear. Heart and pulmonary vascularity are normal. The mediastinum is normal in width. There is calcification in the wall of the aortic arch. There is no pleural effusion or pneumothorax. The bony thorax is unremarkable. IMPRESSION: There is no active cardiopulmonary disease. Thoracic aortic atherosclerosis. Electronically Signed   By: David  Martinique M.D.   On: 05/04/2018 13:19     Procedures Procedures (including critical care time)  Medications Ordered in ED Medications  acetaminophen (TYLENOL) tablet 650 mg (650 mg Oral Given 05/04/18 1415)     Initial Impression / Assessment and Plan / ED Course  I have reviewed the triage vital signs and the nursing notes.  Pertinent labs & imaging results that were available during my care of the patient were reviewed by me and considered in my medical decision making (see chart for details).     Labs: influenza, Group A strep, Influenza   Imaging: Dg Chest 2 view   Consults:  Therapeutics:  Discharge Meds:   Assessment/Plan: 62 year old female presents today with upper respiratory infection.  This is likely viral in nature.  She is well-appearing in no acute distress.  She was tachycardic, afebrile.  She was given Tylenol here which improved her symptoms, chest x-ray normal, strep and influenza negative.  Patient encouraged to use symptomatic care at home  strict return precautions outpatient follow-up.  She verbalized understanding and agreement to today's plan.   Final Clinical Impressions(s) / ED Diagnoses   Final diagnoses:  Viral upper respiratory tract infection    ED Discharge Orders    None       Francee Gentile 05/04/18 1622    Quintella Reichert, MD 05/05/18 1332

## 2018-05-04 NOTE — Discharge Instructions (Addendum)
Please read attached information. If you experience any new or worsening signs or symptoms please return to the emergency room for evaluation. Please follow-up with your primary care provider or specialist as discussed.  °

## 2018-05-04 NOTE — ED Triage Notes (Signed)
Pt in with fever, cough and congestion, and body aches since yesterday, has not taken any medication prior to arrival

## 2018-05-12 ENCOUNTER — Ambulatory Visit (INDEPENDENT_AMBULATORY_CARE_PROVIDER_SITE_OTHER): Payer: 59 | Admitting: Endocrinology

## 2018-05-12 ENCOUNTER — Encounter: Payer: Self-pay | Admitting: Endocrinology

## 2018-05-12 VITALS — BP 144/88 | HR 96 | Ht 67.0 in | Wt 148.0 lb

## 2018-05-12 DIAGNOSIS — E119 Type 2 diabetes mellitus without complications: Secondary | ICD-10-CM | POA: Diagnosis not present

## 2018-05-12 LAB — POCT GLYCOSYLATED HEMOGLOBIN (HGB A1C): Hemoglobin A1C: 7.7 % — AB (ref 4.0–5.6)

## 2018-05-12 NOTE — Patient Instructions (Addendum)
Your blood pressure is high today.  Please see your primary care provider soon, to have it rechecked. check your blood sugar twice a day.  vary the time of day when you check, between before the 3 meals, and at bedtime.  also check if you have symptoms of your blood sugar being too high or too low.  please keep a record of the readings and bring it to your next appointment here (or you can bring the meter itself).  You can write it on any piece of paper.  please call us sooner if your blood sugar goes below 70, or if you have a lot of readings over 200.   Please continue the same insulin On this type of insulin schedule, you should eat meals on a regular schedule.  If a meal is missed or significantly delayed, your blood sugar could go low. Also, you may need to eat a light snack at bedtime, to keep it from going low in the early morning.   Please come back for a follow-up appointment in 2 months.

## 2018-05-12 NOTE — Progress Notes (Signed)
Subjective:    Patient ID: Tricia Huynh, female    DOB: 05-03-1956, 62 y.o.   MRN: [id number]  HPI Pt returns for f/u of diabetes mellitus: DM type: 1 Dx'ed: 6073 Complications: polyneuropathy Therapy: insulin since 2010.   GDM: never DKA: once, in late 2017 Severe hypoglycemia: once, in 2017.   Pancreatitis: never.  Other: She takes QD insulin, due to noncompliance; insurance declined V-GO.  Interval history: She says she never misses the insulin.  Meter is downloaded today, and the printout is scanned into the record.  It varies from 58-204.  It is in general higher as the day goes on.  pt states she feels well in general.   Past Medical History:  Diagnosis Date  . Diabetes mellitus type 2 in nonobese ([place]) 06/06/2015  . Diabetes mellitus without complication ([place])   . Diabetic neuropathy ([place]) 06/06/2015  . Dyslipidemia 06/06/2015  . Encephalopathy   . Essential hypertension 06/06/2015  . Hypertension   . Mood disorder ([place]) 06/06/2015  . Pneumonia 03/11/2017  . Seizure [facility])    sees [name]. abd eeg, on keppra    Past Surgical History:  Procedure Laterality Date  . ABDOMINAL HYSTERECTOMY    . EYE SURGERY Bilateral   . TUBAL LIGATION      Social History   Socioeconomic History  . Marital status: Single    Spouse name: Not on file  . Number of children: 3  . Years of education: 10  . Highest education level: Not on file  Occupational History  . Occupation: Disabled  Social Needs  . Financial resource strain: Not on file  . Food insecurity:    Worry: Not on file    Inability: Not on file  . Transportation needs:    Medical: Not on file    Non-medical: Not on file  Tobacco Use  . Smoking status: Former Research scientist (life sciences)  . Smokeless tobacco: Never Used  Substance and Sexual Activity  . Alcohol use: No  . Drug use: No  . Sexual activity: Yes  Lifestyle  . Physical activity:    Days per week: Not on file    Minutes per session: Not on file  . Stress: Not on  file  Relationships  . Social connections:    Talks on phone: Not on file    Gets together: Not on file    Attends religious service: Not on file    Active member of club or organization: Not on file    Attends meetings of clubs or organizations: Not on file    Relationship status: Not on file  . Intimate partner violence:    Fear of current or ex partner: Not on file    Emotionally abused: Not on file    Physically abused: Not on file    Forced sexual activity: Not on file  Other Topics Concern  . Not on file  Social History Narrative   Lives at home with her grandchildren.   Right-handed.   No caffeine use.    Current Outpatient Medications on File Prior to Visit  Medication Sig Dispense Refill  . acetaminophen (TYLENOL) 500 MG tablet Take 1,000 mg by mouth every 6 (six) hours as needed for mild pain.    Marland Kitchen amitriptyline (ELAVIL) 50 MG tablet Take 50 mg by mouth at bedtime.  1  . amLODipine-olmesartan (AZOR) 5-40 MG tablet Take 1 tablet by mouth daily.    Marland Kitchen aspirin EC 81 MG tablet Take 81 mg by mouth daily.    Marland Kitchen  atorvastatin (LIPITOR) 40 MG tablet Take 40 mg by mouth daily.  1  . Insulin Glargine (LANTUS SOLOSTAR) 100 UNIT/ML Solostar Pen Inject 30 Units into the skin every morning. And pen needles 1/day 5 pen PRN  . levETIRAcetam (KEPPRA) 750 MG tablet Take 1 tablet (750 mg total) by mouth 2 (two) times daily. 60 tablet 6  . metoprolol succinate (TOPROL-XL) 25 MG 24 hr tablet Take 50 mg by mouth at bedtime.  1  . vitamin E 400 UNIT capsule Take 400 Units by mouth daily.     No current facility-administered medications on file prior to visit.     Allergies  Allergen Reactions  . Percocet [Oxycodone-Acetaminophen] Nausea And Vomiting and Other (See Comments)    Shaky/unsteady.  . Vicodin [Hydrocodone-Acetaminophen] Nausea And Vomiting and Other (See Comments)    Shaky/unsteady.    Family History  Problem Relation Age of Onset  . Heart disease Father   . Kidney disease  Father   . Diabetes Mother   . Seizures Sister   . Seizures Brother     BP (!) 144/88 (BP Location: Right Arm, Patient Position: Sitting, Cuff Size: Normal)   Pulse 96   Ht 5\' 7"  (1.702 m)   Wt 148 lb (67.1 kg)   SpO2 98%   BMI 23.18 kg/m    Review of Systems Denies LOC.      Objective:   Physical Exam VITAL SIGNS:  See vs page GENERAL: no distress Pulses: dorsalis pedis intact bilat.   MSK: no deformity of the feet CV: no leg edema Skin:  no ulcer on the feet.  normal color and temp on the feet. Neuro: sensation is intact to touch on the feet  Lab Results  Component Value Date   CREATININE 0.80 01/22/2018   BUN 12 01/22/2018   NA 142 01/22/2018   K 3.7 01/22/2018   CL 103 01/22/2018   CO2 23 01/22/2018    Lab Results  Component Value Date   HGBA1C 7.7 (A) 05/12/2018       Assessment & Plan:  HTN: is noted today Type 1 DM, with polyneuropathy: this is the best control this pt should aim for, given this regimen, which does match insulin to her changing needs throughout the day   Patient Instructions  Your blood pressure is high today.  Please see your primary care provider soon, to have it rechecked. check your blood sugar twice a day.  vary the time of day when you check, between before the 3 meals, and at bedtime.  also check if you have symptoms of your blood sugar being too high or too low.  please keep a record of the readings and bring it to your next appointment here (or you can bring the meter itself).  You can write it on any piece of paper.  please call us sooner if your blood sugar goes below 70, or if you have a lot of readings over 200.   Please continue the same insulin On this type of insulin schedule, you should eat meals on a regular schedule.  If a meal is missed or significantly delayed, your blood sugar could go low. Also, you may need to eat a light snack at bedtime, to keep it from going low in the early morning.   Please come back for a  follow-up appointment in 2 months.

## 2018-05-25 ENCOUNTER — Ambulatory Visit: Payer: 59 | Admitting: Podiatry

## 2018-05-26 ENCOUNTER — Emergency Department (HOSPITAL_COMMUNITY)
Admission: EM | Admit: 2018-05-26 | Discharge: 2018-05-27 | Disposition: A | Discharge status: Home / Self Care | Payer: 59 | Attending: Emergency Medicine | Admitting: Emergency Medicine

## 2018-05-26 ENCOUNTER — Encounter (HOSPITAL_COMMUNITY): Payer: Self-pay | Admitting: *Deleted

## 2018-05-26 ENCOUNTER — Other Ambulatory Visit: Payer: Self-pay

## 2018-05-26 ENCOUNTER — Emergency Department (HOSPITAL_COMMUNITY): Payer: 59

## 2018-05-26 DIAGNOSIS — N183 Chronic kidney disease, stage 3 (moderate): Secondary | ICD-10-CM | POA: Diagnosis not present

## 2018-05-26 DIAGNOSIS — I129 Hypertensive chronic kidney disease with stage 1 through stage 4 chronic kidney disease, or unspecified chronic kidney disease: Secondary | ICD-10-CM | POA: Diagnosis not present

## 2018-05-26 DIAGNOSIS — Z79899 Other long term (current) drug therapy: Secondary | ICD-10-CM | POA: Insufficient documentation

## 2018-05-26 DIAGNOSIS — Z794 Long term (current) use of insulin: Secondary | ICD-10-CM | POA: Insufficient documentation

## 2018-05-26 DIAGNOSIS — R110 Nausea: Secondary | ICD-10-CM | POA: Diagnosis not present

## 2018-05-26 DIAGNOSIS — Z7982 Long term (current) use of aspirin: Secondary | ICD-10-CM | POA: Insufficient documentation

## 2018-05-26 DIAGNOSIS — R1011 Right upper quadrant pain: Secondary | ICD-10-CM | POA: Insufficient documentation

## 2018-05-26 DIAGNOSIS — Z87891 Personal history of nicotine dependence: Secondary | ICD-10-CM | POA: Insufficient documentation

## 2018-05-26 DIAGNOSIS — E119 Type 2 diabetes mellitus without complications: Secondary | ICD-10-CM | POA: Insufficient documentation

## 2018-05-26 LAB — COMPREHENSIVE METABOLIC PANEL
ALT: 23 U/L (ref 0–44)
AST: 30 U/L (ref 15–41)
Albumin: 3.9 g/dL (ref 3.5–5.0)
Alkaline Phosphatase: 175 U/L — ABNORMAL HIGH (ref 38–126)
Anion gap: 9 (ref 5–15)
BUN: 14 mg/dL (ref 8–23)
CO2: 24 mmol/L (ref 22–32)
Calcium: 9.4 mg/dL (ref 8.9–10.3)
Chloride: 108 mmol/L (ref 98–111)
Creatinine, Ser: 1.3 mg/dL — ABNORMAL HIGH (ref 0.44–1.00)
GFR calc Af Amer: 50 mL/min — ABNORMAL LOW (ref >60)
GFR calc non Af Amer: 43 mL/min — ABNORMAL LOW (ref >60)
Glucose, Bld: 98 mg/dL (ref 70–99)
Potassium: 3.9 mmol/L (ref 3.5–5.1)
Sodium: 141 mmol/L (ref 135–145)
Total Bilirubin: 0.7 mg/dL (ref 0.3–1.2)
Total Protein: 8.2 g/dL — ABNORMAL HIGH (ref 6.5–8.1)

## 2018-05-26 LAB — URINALYSIS, ROUTINE W REFLEX MICROSCOPIC
Bilirubin Urine: NEGATIVE
Glucose, UA: NEGATIVE mg/dL
Hgb urine dipstick: NEGATIVE
Ketones, ur: 5 mg/dL — AB
Nitrite: NEGATIVE
Protein, ur: 100 mg/dL — AB
Specific Gravity, Urine: 1.024 (ref 1.005–1.030)
pH: 5 (ref 5.0–8.0)

## 2018-05-26 LAB — CBC
HCT: 35.1 % — ABNORMAL LOW (ref 36.0–46.0)
Hemoglobin: 11.3 g/dL — ABNORMAL LOW (ref 12.0–15.0)
MCH: 28.8 pg (ref 26.0–34.0)
MCHC: 32.2 g/dL (ref 30.0–36.0)
MCV: 89.3 fL (ref 78.0–100.0)
Platelets: 289 10*3/uL (ref 150–400)
RBC: 3.93 MIL/uL (ref 3.87–5.11)
RDW: 12.7 % (ref 11.5–15.5)
WBC: 5.5 10*3/uL (ref 4.0–10.5)

## 2018-05-26 LAB — CBG MONITORING, ED: Glucose-Capillary: 96 mg/dL (ref 70–99)

## 2018-05-26 LAB — LIPASE, BLOOD: Lipase: 39 U/L (ref 11–51)

## 2018-05-26 MED ORDER — METOPROLOL SUCCINATE ER 50 MG PO TB24: 50.0000 mg | ORAL_TABLET | Freq: Every day | ORAL | Status: DC

## 2018-05-26 MED ORDER — ONDANSETRON 4 MG PO TBDP: 8.0000 mg | ORAL_TABLET | Freq: Once | ORAL | Status: DC

## 2018-05-26 MED ORDER — ONDANSETRON HCL 4 MG/2ML IJ SOLN
4.0000 mg | Freq: Once | INTRAMUSCULAR | Status: AC | PRN
  Administered 2018-05-26: 4 mg via INTRAVENOUS
  Filled 2018-05-26: qty 2

## 2018-05-26 MED ORDER — METOPROLOL SUCCINATE ER 50 MG PO TB24
50.0000 mg | ORAL_TABLET | Freq: Once | ORAL | Status: AC
  Administered 2018-05-27: 50 mg via ORAL
  Filled 2018-05-26: qty 1

## 2018-05-26 NOTE — ED Triage Notes (Signed)
The pt is c/o a headache and abd pain n and vomiting since yesterday

## 2018-05-26 NOTE — ED Provider Notes (Signed)
Patient placed in Quick Look pathway, seen and evaluated   Chief Complaint: Nausea  HPI:   62 year old female who is insulin-dependent diabetic presents with 2 days of nausea.  She has not been able to eat or drink much because she feels so nauseous and her abdomen started hurting after she tries to eat something.  She also reports a frontal headache which started after the nausea.  No fevers but she has chills.  No actual vomiting.  No diarrhea or urinary symptoms.  She reports her cough is resolved from her evaluation a couple of weeks ago.  ROS: +nausea, +abdominal pain, +headache   Physical Exam:   Gen: No distress  Neuro: Awake and Alert  Skin: Warm    Focused Exam: Heart: Tachycardic and regular    Lungs: CTA    Abdomen: Soft, mildly generally tender   Initiation of care has begun. The patient has been counseled on the process, plan, and necessity for staying for the completion/evaluation, and the remainder of the medical screening examination    Recardo Evangelist, PA-C 05/26/18 1818    Duffy Bruce, MD 05/28/18 1105

## 2018-05-26 NOTE — ED Notes (Signed)
Results reviewed.  No changes in acuity at this time 

## 2018-05-26 NOTE — ED Notes (Signed)
Pt complains of constant nausea since Monday with dry heaving x1. Pt also states she had a headache since Tuesday but that has gone away.

## 2018-05-26 NOTE — ED Provider Notes (Signed)
[facility] EMERGENCY DEPARTMENT Provider Note   CSN: [id number] Arrival date & time: 05/26/18  1802     History   Chief Complaint Chief Complaint  Patient presents with  . Abdominal Pain  . Headache    HPI Tricia Huynh is a 62 y.o. female.  HPI  Tricia Huynh is a 62 y.o. female, with a history of DM, dyslipidemia, HTN, and seizures, presenting to the ED with nausea and abdominal pain beginning two days ago. Symptoms arise after eating, RUQ and epigastric discomfort, aching, 8/10, radiates throughout the abdomen. Lasts about an hour, will also resolve if patient falls asleep.   Denies fever/chills, vomiting, diarrhea, hematochezia/melena, urinary symptoms, or any other complaints.   Past Medical History:  Diagnosis Date  . Diabetes mellitus type 2 in nonobese ([place]) 06/06/2015  . Diabetes mellitus without complication ([place])   . Diabetic neuropathy ([place]) 06/06/2015  . Dyslipidemia 06/06/2015  . Encephalopathy   . Essential hypertension 06/06/2015  . Hypertension   . Mood disorder ([place]) 06/06/2015  . Pneumonia 03/11/2017  . Seizure [facility])    sees [name]. abd eeg, on keppra    Patient Active Problem List   Diagnosis Date Noted  . HCAP (healthcare-associated pneumonia) 03/11/2017  . Dyslipidemia 03/11/2017  . Diabetes mellitus with complication ([place])   . Chest pain 12/30/2016  . Seizure disorder ([place]) 12/30/2016  . Diabetic ketoacidosis without coma associated with type 2 diabetes mellitus ([place])   . DKA (diabetic ketoacidoses) ([place]) 11/15/2016  . UTI (urinary tract infection) 11/15/2016  . Acute cystitis without hematuria   . Acute renal failure ([place])   . Hyperglycemia 11/14/2016  . Diabetes ([place]) 06/05/2016  . Altered mental status   . CKD (chronic kidney disease) stage 3, GFR 30-59 ml/min ([facility]) 05/26/2016  . Benign essential HTN 05/26/2016  . Seizures ([place]) 01/08/2016  . Memory loss 10/04/2015  . Acute encephalopathy 06/06/2015  .  Diabetic neuropathy ([place]) 06/06/2015    Past Surgical History:  Procedure Laterality Date  . ABDOMINAL HYSTERECTOMY    . EYE SURGERY Bilateral   . TUBAL LIGATION       OB History   None      Home Medications    Prior to Admission medications   Medication Sig Start Date End Date Taking? Authorizing Provider  acetaminophen (TYLENOL) 500 MG tablet Take 1,000 mg by mouth every 6 (six) hours as needed for mild pain.   Yes [provider]  amitriptyline (ELAVIL) 50 MG tablet Take 50 mg by mouth at bedtime. 05/08/15  Yes [provider]  amLODipine-olmesartan (AZOR) 5-40 MG tablet Take 1 tablet by mouth daily. 10/08/16  Yes [provider]  aspirin EC 81 MG tablet Take 81 mg by mouth daily.   Yes [provider]  atorvastatin (LIPITOR) 40 MG tablet Take 40 mg by mouth daily. 05/10/15  Yes [provider]  Insulin Glargine (LANTUS SOLOSTAR) 100 UNIT/ML Solostar Pen Inject 30 Units into the skin every morning. And pen needles 1/day Patient taking differently: Inject 30 Units into the skin daily.  12/31/17  Yes Renato Shin, MD  levETIRAcetam (KEPPRA) 750 MG tablet Take 1 tablet (750 mg total) by mouth 2 (two) times daily. 05/26/17  Yes Marcial Pacas, MD  metoprolol succinate (TOPROL-XL) 25 MG 24 hr tablet Take 50 mg by mouth at bedtime. 05/08/15  Yes [provider]  vitamin E 400 UNIT capsule Take 400 Units by mouth daily.   Yes [provider]  famotidine (  PEPCID) 20 MG tablet Take 1 tablet (20 mg total) by mouth 2 (two) times daily for 5 days. 05/27/18 06/01/18  Daziah Hesler C, PA-C  ondansetron (ZOFRAN ODT) 4 MG disintegrating tablet Take 1 tablet (4 mg total) by mouth every 8 (eight) hours as needed for nausea or vomiting. 05/27/18   Samayra Hebel C, PA-C  pantoprazole (PROTONIX) 20 MG tablet Take 1 tablet (20 mg total) by mouth daily. 05/27/18 07/26/18  Lorayne Bender, PA-C    Family History Family History  Problem Relation Age of Onset  .  Heart disease Father   . Kidney disease Father   . Diabetes Mother   . Seizures Sister   . Seizures Brother     Social History Social History   Tobacco Use  . Smoking status: Former Research scientist (life sciences)  . Smokeless tobacco: Never Used  Substance Use Topics  . Alcohol use: No  . Drug use: No     Allergies   Percocet [oxycodone-acetaminophen] and Vicodin [hydrocodone-acetaminophen]   Review of Systems Review of Systems  Constitutional: Negative for chills and fever.  Respiratory: Negative for shortness of breath.   Cardiovascular: Negative for chest pain.  Gastrointestinal: Positive for abdominal pain and nausea. Negative for blood in stool, diarrhea and vomiting.  Genitourinary: Negative for dysuria, flank pain, frequency and hematuria.  Musculoskeletal: Negative for back pain.  All other systems reviewed and are negative.    Physical Exam Updated Vital Signs BP (!) 169/109 (BP Location: Right Arm)   Pulse (!) 102   Temp 98.9 F (37.2 C) (Oral)   Resp 20   Ht 5\' 7"  (1.702 m)   Wt 64.9 kg (143 lb)   SpO2 100%   BMI 22.40 kg/m   Physical Exam  Constitutional: She appears well-developed and well-nourished. No distress.  HENT:  Head: Normocephalic and atraumatic.  Eyes: Conjunctivae are normal.  Neck: Neck supple.  Cardiovascular: Normal rate, regular rhythm, normal heart sounds and intact distal pulses.  Not tachycardic on my exam.  Pulmonary/Chest: Effort normal and breath sounds normal. No respiratory distress.  Abdominal: Soft. There is tenderness (very mild, indicates this verbally only) in the right upper quadrant. There is no guarding and no CVA tenderness.  Musculoskeletal: She exhibits no edema.  Lymphadenopathy:    She has no cervical adenopathy.  Neurological: She is alert.  Skin: Skin is warm and dry. She is not diaphoretic.  Psychiatric: She has a normal mood and affect. Her behavior is normal.  Nursing note and vitals reviewed.    ED Treatments /  Results  Labs (all labs ordered are listed, but only abnormal results are displayed) Labs Reviewed  COMPREHENSIVE METABOLIC PANEL - Abnormal; Notable for the following components:      Result Value   Creatinine, Ser 1.30 (*)    Total Protein 8.2 (*)    Alkaline Phosphatase 175 (*)    GFR calc non Af Amer 43 (*)    GFR calc Af Amer 50 (*)    All other components within normal limits  CBC - Abnormal; Notable for the following components:   Hemoglobin 11.3 (*)    HCT 35.1 (*)    All other components within normal limits  URINALYSIS, ROUTINE W REFLEX MICROSCOPIC - Abnormal; Notable for the following components:   Color, Urine AMBER (*)    APPearance HAZY (*)    Ketones, ur 5 (*)    Protein, ur 100 (*)    Leukocytes, UA SMALL (*)    Bacteria, UA MANY (*)  All other components within normal limits  LIPASE, BLOOD  CBG MONITORING, ED    EKG None  Radiology US Abdomen Limited Ruq  Result Date: 05/27/2018 CLINICAL DATA:  Right upper quadrant pain. EXAM: ULTRASOUND ABDOMEN LIMITED RIGHT UPPER QUADRANT COMPARISON:  None. FINDINGS: Gallbladder: Physiologically distended. No gallstones or wall thickening visualized. No sonographic Murphy sign noted by sonographer. Common bile duct: Diameter: 5 mm, normal. Liver: No focal lesion identified. Within normal limits in parenchymal echogenicity. Portal vein is patent on color Doppler imaging with normal direction of blood flow towards the liver. IMPRESSION: Normal right upper quadrant ultrasound. Electronically Signed   By: Jeb Levering M.D.   On: 05/27/2018 00:11    Procedures Procedures (including critical care time)  Medications Ordered in ED Medications  ondansetron [address] [facility]) injection 4 mg (4 mg Intravenous Given 05/26/18 2156)  metoprolol succinate (TOPROL-XL) 24 hr tablet 50 mg (50 mg Oral Given 05/27/18 0105)     Initial Impression / Assessment and Plan / ED Course  I have reviewed the triage vital signs and the nursing  notes.  Pertinent labs & imaging results that were available during my care of the patient were reviewed by me and considered in my medical decision making (see chart for details).  Clinical Course as of May 27 541  Wed May 26, 2018  2159 Consistent with previous values.  Hemoglobin(!): 11.3 [SJ]  2213 Patient denies urinary symptoms.   Urinalysis, Routine w reflex microscopic(!) [SJ]  2215 Combined with the ketonuria, I suspect this is due to dehydration.   Creatinine(!): 1.30 [SJ]  2219 Patient has not taken her medications today due to nausea.  BP(!): 181/115 [SJ]    Clinical Course User Index [SJ] Katelind Pytel C, PA-C   Patient presents with right upper quadrant and epigastric discomfort as well as nausea with eating. Patient is nontoxic appearing, afebrile, not tachycardic, not tachypneic, not hypotensive, maintains excellent SPO2 on room air, and is in no apparent distress.  Suspicion for possible biliary colic.  No acute abnormalities on ultrasound. Tolerating PO.  PCP versus general surgery follow-up. The patient was given instructions for home care as well as return precautions. Patient voices understanding of these instructions, accepts the plan, and is comfortable with discharge.   Final Clinical Impressions(s) / ED Diagnoses   Final diagnoses:  RUQ pain  Nausea    ED Discharge Orders        Ordered    pantoprazole (PROTONIX) 20 MG tablet  Daily     05/27/18 0144    famotidine (PEPCID) 20 MG tablet  2 times daily     05/27/18 0144    ondansetron (ZOFRAN ODT) 4 MG disintegrating tablet  Every 8 hours PRN     05/27/18 0144       Lorayne Bender, PA-C 05/27/18 0543    Duffy Bruce, MD 05/28/18 1101

## 2018-05-27 DIAGNOSIS — R1011 Right upper quadrant pain: Secondary | ICD-10-CM | POA: Diagnosis not present

## 2018-05-27 MED ORDER — ONDANSETRON 4 MG PO TBDP: 4.0000 mg | ORAL_TABLET | Freq: Three times a day (TID) | ORAL | 0 refills | Status: DC | PRN

## 2018-05-27 MED ORDER — FAMOTIDINE 20 MG PO TABS: 20.0000 mg | ORAL_TABLET | Freq: Two times a day (BID) | ORAL | 0 refills | Status: DC

## 2018-05-27 MED ORDER — PANTOPRAZOLE SODIUM 20 MG PO TBEC: 20.0000 mg | DELAYED_RELEASE_TABLET | Freq: Every day | ORAL | 1 refills | Status: DC

## 2018-05-27 NOTE — Discharge Instructions (Addendum)
Abdominal discomfort with nausea  Your symptoms may be due to your gallbladder, but your ultrasound was normal.  Diet: Start with a clear liquid diet, progressed to a full liquid diet, and then bland solids as you are able. Please adhere to the enclosed dietary suggestions.  In general, avoid NSAIDs (i.e. ibuprofen, naproxen, etc.), caffeine, alcohol, spicy foods, fatty foods, or any other foods that seem to cause your symptoms to arise.  Protonix: Take this medication daily, 20-30 minutes prior to your first meal, for the next 8 weeks.  Continue to take this medication even if you begin to feel better.  Pepcid: Take this medication twice a day for the next 5 days.  Zofran: Take this medication, as needed, for nausea.  Follow-up: Please follow-up with your primary care provider or the general surgeon on this matter.  Return: Return to the ED for significantly worsening symptoms, persistent vomiting, persistent fever, vomiting blood, blood in the stools, dark stools, or any other major concerns.

## 2018-06-03 ENCOUNTER — Telehealth: Payer: Self-pay | Admitting: Nurse Practitioner

## 2018-06-03 MED ORDER — LEVETIRACETAM 750 MG PO TABS: 750.0000 mg | ORAL_TABLET | Freq: Two times a day (BID) | ORAL | 6 refills | Status: DC

## 2018-06-03 NOTE — Telephone Encounter (Signed)
Pt requesting a refill for levETIRAcetam (KEPPRA) 750 MG tablet sent to [facility]

## 2018-06-10 ENCOUNTER — Other Ambulatory Visit: Payer: Self-pay

## 2018-06-10 MED ORDER — INSULIN GLARGINE 100 UNIT/ML SOLOSTAR PEN: PEN_INJECTOR | SUBCUTANEOUS | 3 refills | Status: DC

## 2018-06-18 ENCOUNTER — Telehealth: Payer: Self-pay | Admitting: Endocrinology

## 2018-06-18 NOTE — Telephone Encounter (Signed)
Tricia Huynh with [facility] ph# [phone] called re: status of the forms that were faxed to our office for diabetic supplies and insulin supplies. Patient needs supplies asap. Please call ph# listed above to give verbal authorization (last they had was pen needles 3 x per day-test frequency-last one showed testing 2 x per day)

## 2018-06-18 NOTE — Telephone Encounter (Signed)
LVM for patient to call back & clarify if she uses [facility] or [facility]. [facility] stated that they spoke with patient on 7/17 & she was wanting to submit for refill.

## 2018-07-01 ENCOUNTER — Telehealth: Payer: Self-pay | Admitting: Endocrinology

## 2018-07-01 ENCOUNTER — Other Ambulatory Visit: Payer: Self-pay

## 2018-07-01 MED ORDER — INSULIN PEN NEEDLE 31G X 5 MM MISC: 99 refills | Status: DC

## 2018-07-01 NOTE — Telephone Encounter (Signed)
I have sent to pharmacy.

## 2018-07-01 NOTE — Telephone Encounter (Signed)
Patient needs RX for Ultra Fine Pen Needles sent to [facility] on [address]

## 2018-07-12 ENCOUNTER — Ambulatory Visit (INDEPENDENT_AMBULATORY_CARE_PROVIDER_SITE_OTHER): Payer: 59 | Admitting: Endocrinology

## 2018-07-12 VITALS — BP 164/104 | HR 92 | Temp 98.6°F | Resp 16 | Wt 152.0 lb

## 2018-07-12 DIAGNOSIS — E118 Type 2 diabetes mellitus with unspecified complications: Secondary | ICD-10-CM

## 2018-07-12 LAB — POCT GLYCOSYLATED HEMOGLOBIN (HGB A1C): Hemoglobin A1C: 7.7 % — AB (ref 4.0–5.6)

## 2018-07-12 MED ORDER — INSULIN GLARGINE 100 UNIT/ML SOLOSTAR PEN: 28.0000 [IU] | PEN_INJECTOR | SUBCUTANEOUS | 3 refills | Status: DC

## 2018-07-12 NOTE — Progress Notes (Signed)
Subjective:    Patient ID: Tricia Huynh, female    DOB: 01/08/1956, 62 y.o.   MRN: [id number]  HPI Pt returns for f/u of diabetes mellitus: DM type: 1 Dx'ed: 8270 Complications: polyneuropathy Therapy: insulin since 2010.   GDM: never DKA: once, in late 2017 Severe hypoglycemia: once, in 2017.   Pancreatitis: never.  Other: She takes QD insulin, due to noncompliance; insurance declined V-GO.  Interval history: She says she never misses the insulin.  Meter is downloaded today, and the printout is scanned into the record.  It varies from 48-400.  It is in general higher as the day goes on, but she can have hypoglycemia in the afternoon, also.  pt states she feels well in general.   Past Medical History:  Diagnosis Date  . Diabetes mellitus type 2 in nonobese ([place]) 06/06/2015  . Diabetes mellitus without complication ([place])   . Diabetic neuropathy ([place]) 06/06/2015  . Dyslipidemia 06/06/2015  . Encephalopathy   . Essential hypertension 06/06/2015  . Hypertension   . Mood disorder ([place]) 06/06/2015  . Pneumonia 03/11/2017  . Seizure [facility])    sees [name]. abd eeg, on keppra    Past Surgical History:  Procedure Laterality Date  . ABDOMINAL HYSTERECTOMY    . EYE SURGERY Bilateral   . TUBAL LIGATION      Social History   Socioeconomic History  . Marital status: Single    Spouse name: Not on file  . Number of children: 3  . Years of education: 10  . Highest education level: Not on file  Occupational History  . Occupation: Disabled  Social Needs  . Financial resource strain: Not on file  . Food insecurity:    Worry: Not on file    Inability: Not on file  . Transportation needs:    Medical: Not on file    Non-medical: Not on file  Tobacco Use  . Smoking status: Former Research scientist (life sciences)  . Smokeless tobacco: Never Used  Substance and Sexual Activity  . Alcohol use: No  . Drug use: No  . Sexual activity: Yes  Lifestyle  . Physical activity:    Days per week: Not on file   Minutes per session: Not on file  . Stress: Not on file  Relationships  . Social connections:    Talks on phone: Not on file    Gets together: Not on file    Attends religious service: Not on file    Active member of club or organization: Not on file    Attends meetings of clubs or organizations: Not on file    Relationship status: Not on file  . Intimate partner violence:    Fear of current or ex partner: Not on file    Emotionally abused: Not on file    Physically abused: Not on file    Forced sexual activity: Not on file  Other Topics Concern  . Not on file  Social History Narrative   Lives at home with her grandchildren.   Right-handed.   No caffeine use.    Current Outpatient Medications on File Prior to Visit  Medication Sig Dispense Refill  . acetaminophen (TYLENOL) 500 MG tablet Take 1,000 mg by mouth every 6 (six) hours as needed for mild pain.    Marland Kitchen amitriptyline (ELAVIL) 50 MG tablet Take 50 mg by mouth at bedtime.  1  . amLODipine-olmesartan (AZOR) 5-40 MG tablet Take 1 tablet by mouth daily.    Marland Kitchen aspirin EC 81 MG tablet  Take 81 mg by mouth daily.    Marland Kitchen atorvastatin (LIPITOR) 40 MG tablet Take 40 mg by mouth daily.  1  . Insulin Pen Needle 31G X 5 MM MISC Used to give daily insulin injections. 100 each prn  . levETIRAcetam (KEPPRA) 750 MG tablet Take 1 tablet (750 mg total) by mouth 2 (two) times daily. 60 tablet 6  . metoprolol succinate (TOPROL-XL) 25 MG 24 hr tablet Take 50 mg by mouth at bedtime.  1  . ondansetron (ZOFRAN ODT) 4 MG disintegrating tablet Take 1 tablet (4 mg total) by mouth every 8 (eight) hours as needed for nausea or vomiting. 20 tablet 0  . pantoprazole (PROTONIX) 20 MG tablet Take 1 tablet (20 mg total) by mouth daily. 30 tablet 1  . vitamin E 400 UNIT capsule Take 400 Units by mouth daily.    . famotidine (PEPCID) 20 MG tablet Take 1 tablet (20 mg total) by mouth 2 (two) times daily for 5 days. 10 tablet 0   No current facility-administered  medications on file prior to visit.     Allergies  Allergen Reactions  . Percocet [Oxycodone-Acetaminophen] Nausea And Vomiting and Other (See Comments)    Shaky/unsteady.  . Vicodin [Hydrocodone-Acetaminophen] Nausea And Vomiting and Other (See Comments)    Shaky/unsteady.    Family History  Problem Relation Age of Onset  . Heart disease Father   . Kidney disease Father   . Diabetes Mother   . Seizures Sister   . Seizures Brother     BP (!) 164/104   Pulse 92   Temp 98.6 F (37 C) (Oral)   Resp 16   Wt 152 lb (68.9 kg)   SpO2 98%   BMI 23.81 kg/m    Review of Systems Denies LOC.  Left ear is blocked, but not painful    Objective:   Physical Exam VITAL SIGNS:  See vs page GENERAL: no distress Pulses: foot pulses are intact bilaterally.   MSK: no deformity of the feet or ankles.  CV: no edema of the legs or ankles Skin:  no ulcer on the feet or ankles.  normal color and temp on the feet and ankles Neuro: sensation is intact to touch on the feet and ankles.      Lab Results  Component Value Date   HGBA1C 7.7 (A) 07/12/2018       Assessment & Plan:  HTN: is noted today Type 1 DM: this is the best control this pt should aim for, given this regimen, which does match insulin to her changing needs throughout the day Hypoglycemia: we'll have to reduce the insulin, even if a1c goes up to 8% Cerumen impaction, new  Patient Instructions  Your blood pressure is high today.  Please see your primary care provider soon, to have it rechecked check your blood sugar twice a day.  vary the time of day when you check, between before the 3 meals, and at bedtime.  also check if you have symptoms of your blood sugar being too high or too low.  please keep a record of the readings and bring it to your next appointment here (or you can bring the meter itself).  You can write it on any piece of paper.  please call us sooner if your blood sugar goes below 70, or if you have a lot of  readings over 200. Please reduce the insulin to 28 units each morning.   Try putting some drops of peroxide into the left  ear. On this type of insulin schedule, you should eat meals on a regular schedule.  If a meal is missed or significantly delayed, your blood sugar could go low.  also, you may need to eat a light snack, to avoid it going low in the early morning.

## 2018-07-12 NOTE — Patient Instructions (Addendum)
Your blood pressure is high today.  Please see your primary care provider soon, to have it rechecked check your blood sugar twice a day.  vary the time of day when you check, between before the 3 meals, and at bedtime.  also check if you have symptoms of your blood sugar being too high or too low.  please keep a record of the readings and bring it to your next appointment here (or you can bring the meter itself).  You can write it on any piece of paper.  please call us sooner if your blood sugar goes below 70, or if you have a lot of readings over 200. Please reduce the insulin to 28 units each morning.   Try putting some drops of peroxide into the left ear. On this type of insulin schedule, you should eat meals on a regular schedule.  If a meal is missed or significantly delayed, your blood sugar could go low.  also, you may need to eat a light snack, to avoid it going low in the early morning.

## 2018-07-17 ENCOUNTER — Emergency Department (HOSPITAL_COMMUNITY)
Admission: EM | Admit: 2018-07-17 | Discharge: 2018-07-17 | Disposition: A | Discharge status: Home / Self Care | Payer: 59 | Attending: Emergency Medicine | Admitting: Emergency Medicine

## 2018-07-17 ENCOUNTER — Other Ambulatory Visit: Payer: Self-pay

## 2018-07-17 ENCOUNTER — Encounter (HOSPITAL_COMMUNITY): Payer: Self-pay | Admitting: Emergency Medicine

## 2018-07-17 DIAGNOSIS — N183 Chronic kidney disease, stage 3 (moderate): Secondary | ICD-10-CM | POA: Insufficient documentation

## 2018-07-17 DIAGNOSIS — H9392 Unspecified disorder of left ear: Secondary | ICD-10-CM | POA: Diagnosis present

## 2018-07-17 DIAGNOSIS — I129 Hypertensive chronic kidney disease with stage 1 through stage 4 chronic kidney disease, or unspecified chronic kidney disease: Secondary | ICD-10-CM | POA: Diagnosis not present

## 2018-07-17 DIAGNOSIS — Z7982 Long term (current) use of aspirin: Secondary | ICD-10-CM | POA: Diagnosis not present

## 2018-07-17 DIAGNOSIS — Z79899 Other long term (current) drug therapy: Secondary | ICD-10-CM | POA: Insufficient documentation

## 2018-07-17 DIAGNOSIS — H6123 Impacted cerumen, bilateral: Secondary | ICD-10-CM | POA: Insufficient documentation

## 2018-07-17 DIAGNOSIS — Z87891 Personal history of nicotine dependence: Secondary | ICD-10-CM | POA: Insufficient documentation

## 2018-07-17 DIAGNOSIS — E1122 Type 2 diabetes mellitus with diabetic chronic kidney disease: Secondary | ICD-10-CM | POA: Diagnosis not present

## 2018-07-17 DIAGNOSIS — Z794 Long term (current) use of insulin: Secondary | ICD-10-CM | POA: Diagnosis not present

## 2018-07-17 MED ORDER — CARBAMIDE PEROXIDE 6.5 % OT SOLN
5.0000 [drp] | Freq: Two times a day (BID) | OTIC | 0 refills | Status: DC
Start: 2018-07-17 — End: 2019-08-15

## 2018-07-17 NOTE — ED Notes (Signed)
APP at bedside to attempt further cerumen disimpaction

## 2018-07-17 NOTE — ED Triage Notes (Signed)
Pt c/o left ear pain and feeling like it's "clogged" for "a while". Pt states she has attempted to put peroxide in her ear without relief.

## 2018-07-17 NOTE — ED Notes (Signed)
Registration at bedside.

## 2018-07-17 NOTE — ED Notes (Signed)
Patient able to ambulate independently  

## 2018-07-17 NOTE — ED Provider Notes (Signed)
[facility] EMERGENCY DEPARTMENT Provider Note   CSN: [id number] Arrival date & time: 07/17/18  1757     History   Chief Complaint Chief Complaint  Patient presents with  . Otalgia    HPI Tricia Huynh is a 62 y.o. female.  The history is provided by the patient. No language interpreter was used.  Otalgia  Associated symptoms include hearing loss. Pertinent negatives include no headaches.     62 year old female with history of diabetes hypertension, seizures presenting for evaluation of ear discomfort.  Patient states for the past week she felt that her left ear is "clogged".  States that she had decreased hearing and felt that she may have earwax buildup.  She initially complained of some mild pain to the left ear but has had since resolved.  She denies any severe headache, tinnitus, ear drainage, jaw pain, neck pain, chest pain, trouble breathing or any recent trauma.  She does admits to using Q-tips to clean the ear.  She has been using peroxide to help with earwax but minimal relief.  Past Medical History:  Diagnosis Date  . Diabetes mellitus type 2 in nonobese ([place]) 06/06/2015  . Diabetes mellitus without complication ([place])   . Diabetic neuropathy ([place]) 06/06/2015  . Dyslipidemia 06/06/2015  . Encephalopathy   . Essential hypertension 06/06/2015  . Hypertension   . Mood disorder ([place]) 06/06/2015  . Pneumonia 03/11/2017  . Seizure [facility])    sees [name]. abd eeg, on keppra    Patient Active Problem List   Diagnosis Date Noted  . HCAP (healthcare-associated pneumonia) 03/11/2017  . Dyslipidemia 03/11/2017  . Diabetes mellitus with complication ([place])   . Chest pain 12/30/2016  . Seizure disorder ([place]) 12/30/2016  . Diabetic ketoacidosis without coma associated with type 2 diabetes mellitus ([place])   . DKA (diabetic ketoacidoses) ([place]) 11/15/2016  . UTI (urinary tract infection) 11/15/2016  . Acute cystitis without hematuria   . Acute renal failure  ([place])   . Hyperglycemia 11/14/2016  . Diabetes ([place]) 06/05/2016  . Altered mental status   . CKD (chronic kidney disease) stage 3, GFR 30-59 ml/min ([facility]) 05/26/2016  . Benign essential HTN 05/26/2016  . Seizures ([place]) 01/08/2016  . Memory loss 10/04/2015  . Acute encephalopathy 06/06/2015  . Diabetic neuropathy ([place]) 06/06/2015    Past Surgical History:  Procedure Laterality Date  . ABDOMINAL HYSTERECTOMY    . EYE SURGERY Bilateral   . TUBAL LIGATION       OB History   None      Home Medications    Prior to Admission medications   Medication Sig Start Date End Date Taking? Authorizing Provider  acetaminophen (TYLENOL) 500 MG tablet Take 1,000 mg by mouth every 6 (six) hours as needed for mild pain.    [provider]  amitriptyline (ELAVIL) 50 MG tablet Take 50 mg by mouth at bedtime. 05/08/15   [provider]  amLODipine-olmesartan (AZOR) 5-40 MG tablet Take 1 tablet by mouth daily. 10/08/16   [provider]  aspirin EC 81 MG tablet Take 81 mg by mouth daily.    [provider]  atorvastatin (LIPITOR) 40 MG tablet Take 40 mg by mouth daily. 05/10/15   [provider]  famotidine (PEPCID) 20 MG tablet Take 1 tablet (20 mg total) by mouth 2 (two) times daily for 5 days. 05/27/18 06/01/18  Joy, Shawn C, PA-C  Insulin Glargine (LANTUS SOLOSTAR) 100 UNIT/ML Solostar Pen Inject 28 Units into the skin every  morning. 07/12/18   Renato Shin, MD  Insulin Pen Needle 31G X 5 MM MISC Used to give daily insulin injections. 07/01/18   Renato Shin, MD  levETIRAcetam (KEPPRA) 750 MG tablet Take 1 tablet (750 mg total) by mouth 2 (two) times daily. 06/03/18   Marcial Pacas, MD  metoprolol succinate (TOPROL-XL) 25 MG 24 hr tablet Take 50 mg by mouth at bedtime. 05/08/15   [provider]  ondansetron (ZOFRAN ODT) 4 MG disintegrating tablet Take 1 tablet (4 mg total) by mouth every 8 (eight) hours as needed for nausea or vomiting. 05/27/18   Joy,  Shawn C, PA-C  pantoprazole (PROTONIX) 20 MG tablet Take 1 tablet (20 mg total) by mouth daily. 05/27/18 07/26/18  Joy, Shawn C, PA-C  vitamin E 400 UNIT capsule Take 400 Units by mouth daily.    [provider]    Family History Family History  Problem Relation Age of Onset  . Heart disease Father   . Kidney disease Father   . Diabetes Mother   . Seizures Sister   . Seizures Brother     Social History Social History   Tobacco Use  . Smoking status: Former Research scientist (life sciences)  . Smokeless tobacco: Never Used  Substance Use Topics  . Alcohol use: No  . Drug use: No     Allergies   Percocet [oxycodone-acetaminophen] and Vicodin [hydrocodone-acetaminophen]   Review of Systems Review of Systems  Constitutional: Negative for fever.  HENT: Positive for ear pain and hearing loss.   Neurological: Negative for headaches.     Physical Exam Updated Vital Signs BP (!) 161/106   Pulse (!) 102   Temp 98.6 F (37 C) (Oral)   Resp 20   SpO2 100%   Physical Exam  Constitutional: She appears well-developed and well-nourished. No distress.  HENT:  Head: Atraumatic.  Ears: Cerumen impaction involving both ear, unable to visualize TM bilaterally.  No tenderness to manipulation of earlobes.  Eyes: Conjunctivae are normal.  Neck: Neck supple.  Cardiovascular: Normal rate and regular rhythm.  Pulmonary/Chest: Effort normal and breath sounds normal.  Neurological: She is alert.  Skin: No rash noted.  Psychiatric: She has a normal mood and affect.  Nursing note and vitals reviewed.    ED Treatments / Results  Labs (all labs ordered are listed, but only abnormal results are displayed) Labs Reviewed - No data to display  EKG None  Radiology No results found.  Procedures Procedures (including critical care time)  Medications Ordered in ED Medications - No data to display   Initial Impression / Assessment and Plan / ED Course  I have reviewed the triage vital signs and  the nursing notes.  Pertinent labs & imaging results that were available during my care of the patient were reviewed by me and considered in my medical decision making (see chart for details).     BP (!) 161/106   Pulse (!) 102   Temp 98.6 F (37 C) (Oral)   Resp 20   SpO2 100%    Final Clinical Impressions(s) / ED Diagnoses   Final diagnoses:  Bilateral impacted cerumen    ED Discharge Orders         Ordered    carbamide peroxide (DEBROX) 6.5 % OTIC solution  2 times daily     07/17/18 2158         8:22 PM Patient with decreased hearing to left ear likely related to cerumen impaction.  No evidence to suggest infection at  this time.  Will perform ear irrigation.  10:06 PM Successful removal of cerumen with ear currette.  Pt d/c home with debrox. Recommend avoid Lupita Raider, PA-C 07/17/18 2207    Lennice Sites, DO 07/18/18 1111

## 2018-08-06 NOTE — Progress Notes (Signed)
[facility]  PATIENT: Drianna Chandran DOB: 1956/07/02   REASON FOR VISIT: follow up for seizure disorder, abnormality of gait, diabetic polyneuropathy HISTORY FROM:patient     HISTORY OF PRESENT ILLNESS:Yasemin Rouse is a 62 years old right-handed female, seen in refer by her primary care physician Dr. Audley Hose for evaluation of memory trouble, seizure, she took scat transportation to office, alone at today's clinical visit in July 04 2015.  She had a history of insulin-dependent diabetes, hypertension, hyperlipidemia, lives with her granddaughters, she has been on disability due to her depression since 1998, had 10 years education, used to work at [employer],  She was admitted to the hospital from July 20 to 21 2016, was found down at home, confused, with bowel and bladder incontinence, she was seen by neuro hospitalists, abnormal EEG in July 2016,focal slowing over the occipital regions, left greater than right, Posterior-dominant photoepileptiform response, asymmetric more on the left occipital region, she was started on Keppra 500 mg twice a day, no recurrent seizure event  I have reviewed MRI of the brain without contrast of July 2016:1. No acute intracranial infarct or other abnormality identified. Mild age-related cerebral atrophy. 2.5 cm well-circumscribed ovoid lesion within the left occipital scalp. This lesion is indeterminate, but most certainly benign.  Laboratory evaluation, creatinine 1. 3, low TSH 0.2 CBC showed mild anemia hemoglobin 11 point 5, negative ammonia, UDS, A1c 11. 8,   UPDATE Oct 04 2015:YY She is with her granddaughter [name], She has one seizure in Oct 02 2015, she was not able to elaborate on details. She lives with her granddaughter, she has gait difficulty. She denies significant pain. She has no bladder incontinence, she has mild constipation. 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 Her migraines has much improved  UPDATE Jan 08 2016:YY She is with her daughter at today's clinical visit, she was diagnosed with UTI, was put on antibiotic, has decreased appetite, generalized weakness, continue has gait difficulty, memory trouble, We have personally reviewed her MRI in July 2016, generalized atrophy, especially along perisylvian fissure MRI of lumbar, mild degenerative changes, no significant foraminal or canal stenosis, EMG nerve conduction study January 2017 consistent with mild axonal peripheral neuropathy, consistent with her poorly controlled diabetes, most recent A1c was 11.9, she has a follow-up appointment with endocrinologist Dr. Hilliard Clark today We reviewed laboratory evaluation, mild anemia 10 point 8, She has no recurrent seizure, able to tolerate Keppra 500/1000 mg every night, she lives with her granddaughter, has home health nurse prepare her medications, UPDATE 06/20/2017CM Ms. Murtaugh 62 year old female returns for follow-up.She has history of seizure disorder and is currently on Keppra 500 mg 1 in a.m. And 2 PM. Last seizure November 2016. She is also insulin-dependent diabetic and her CBGs this morning 293. EMG consistent with axonal peripheral neuropathy consistent with poorly controlled diabetes.MRI of the brain in  2016 with generalized atrophy.She returns for reevaluation. UPDATE 12/27/2017CM Ms. Gales, 62 year old female returns for follow-up for history of seizure disorder. She is currently on Keppra 750 twice daily. She had a seizure last week after being off her Keppra. She has been off of it for about one week prior to last week last seizure event occurred November 2016.  She is also insulin-dependent diabetic but has stopped her insulin in July  according to Dr. Cordelia Pen note. She is now back on therapy but is not consistent with  Checking  her sugars UPDATE 09/20/2018CM  Ms. Guimaraes ,62 year old female returns for follow-up  seizure disorder.  Last seizure December 2017 after being off of her seizure medication for about a week  She is currently on Keppra 750 mg twice daily without side effects. She has been compliant with her medication.  Her diabetes is in better control component hemoglobin A1c today 7.5. She denies any falls. She returns for reevaluation UPDATE 9/23/2019CM Ms. Dolder, 62 year old female returns for follow-up with history of seizure disorder.  Last seizure occurred December 2017 after being off of her seizure medication for about a week.  She is currently on Keppra 750 twice daily without side effects.  She needs refills she has been compliant with her medication her diabetes is in better control and she has had a reduction in her insulin she returns for reevaluation REVIEW OF SYSTEMS: Full 14 system review of systems performed and notable only for those listed, all others are neg:  Constitutional:  neg Cardiovascular: neg Ear/Nose/Throat: neg  Skin: neg Eyes: neg Respiratory: neg Gastroitestinal: neg  Hematology/Lymphatic: neg  Endocrine: excessive thirst Musculoskeletal Walking difficulty Allergy/Immunology: neg Neurological: seizure disorder,   Psychiatric: depression, anxiety  Sleep : neg ALLERGIES: Allergies  Allergen Reactions  . Percocet [Oxycodone-Acetaminophen] Nausea And Vomiting and Other (See Comments)    Shaky/unsteady.  . Vicodin [Hydrocodone-Acetaminophen] Nausea And Vomiting and Other (See Comments)    Shaky/unsteady.    HOME MEDICATIONS: Outpatient Medications Prior to Visit  Medication Sig Dispense Refill  . acetaminophen (TYLENOL) 500 MG tablet Take 1,000 mg by mouth every 6 (six) hours as needed for mild pain.    Marland Kitchen amitriptyline (ELAVIL) 50 MG tablet Take 50 mg by mouth at bedtime.  1   . amLODipine-olmesartan (AZOR) 5-40 MG tablet Take 1 tablet by mouth daily.    Marland Kitchen aspirin EC 81 MG tablet Take 81 mg by mouth daily.    Marland Kitchen atorvastatin (LIPITOR) 40 MG tablet Take 40 mg by mouth daily.  1  . carbamide peroxide (DEBROX) 6.5 % OTIC solution Place 5 drops into both ears 2 (two) times daily. 15 mL 0  . Insulin Glargine (LANTUS SOLOSTAR) 100 UNIT/ML Solostar Pen Inject 28 Units into the skin every morning. 9 pen 3  . Insulin Pen Needle 31G X 5 MM MISC Used to give daily insulin injections. 100 each prn  . levETIRAcetam (KEPPRA) 750 MG tablet Take 1 tablet (750 mg total) by mouth 2 (two) times daily. 60 tablet 6  . metoprolol succinate (TOPROL-XL) 25 MG 24 hr tablet Take 50 mg by mouth at bedtime.  1  . ondansetron (ZOFRAN ODT) 4 MG disintegrating tablet Take 1 tablet (4 mg total) by mouth every 8 (eight) hours as needed for nausea or vomiting. 20 tablet 0  . vitamin E 400 UNIT capsule Take 400 Units by mouth daily.    . famotidine (PEPCID) 20 MG tablet Take 1 tablet (20 mg total) by mouth 2 (two) times daily for 5 days. 10 tablet 0  . pantoprazole (PROTONIX) 20 MG tablet Take 1 tablet (20 mg total) by mouth daily. 30 tablet 1   No facility-administered medications prior to visit.     PAST MEDICAL HISTORY: Past Medical History:  Diagnosis Date  . Diabetes mellitus type 2 in nonobese ([place]) 06/06/2015  . Diabetes mellitus without  complication ([place])   . Diabetic neuropathy ([employer]) 06/06/2015  . Dyslipidemia 06/06/2015  . Encephalopathy   . Essential hypertension 06/06/2015  . Hypertension   . Mood disorder ([place]) 06/06/2015  . Pneumonia 03/11/2017  . Seizure ([place])    sees [name]. abd eeg, on keppra    PAST SURGICAL HISTORY: Past Surgical History:  Procedure Laterality Date  . ABDOMINAL HYSTERECTOMY    . EYE SURGERY Bilateral   . TUBAL LIGATION      FAMILY HISTORY: Family History  Problem Relation Age of Onset  . Heart disease Father   . Kidney disease Father   . Diabetes  Mother   . Seizures Sister   . Seizures Brother     SOCIAL HISTORY: Social History   Socioeconomic History  . Marital status: Single    Spouse name: Not on file  . Number of children: 3  . Years of education: 10  . Highest education level: Not on file  Occupational History  . Occupation: Disabled  Social Needs  . Financial resource strain: Not on file  . Food insecurity:    Worry: Not on file    Inability: Not on file  . Transportation needs:    Medical: Not on file    Non-medical: Not on file  Tobacco Use  . Smoking status: Former Research scientist (life sciences)  . Smokeless tobacco: Never Used  Substance and Sexual Activity  . Alcohol use: No  . Drug use: No  . Sexual activity: Yes  Lifestyle  . Physical activity:    Days per week: Not on file    Minutes per session: Not on file  . Stress: Not on file  Relationships  . Social connections:    Talks on phone: Not on file    Gets together: Not on file    Attends religious service: Not on file    Active member of club or organization: Not on file    Attends meetings of clubs or organizations: Not on file    Relationship status: Not on file  . Intimate partner violence:    Fear of current or ex partner: Not on file    Emotionally abused: Not on file    Physically abused: Not on file    Forced sexual activity: Not on file  Other Topics Concern  . Not on file  Social History Narrative   Lives at home with her grandchildren.   Right-handed.   No caffeine use.     PHYSICAL EXAM  Vitals:   08/06/17 1440  BP: (!) 170/96   Pulse: 82  Weight: 148 lb 3.2 oz (67.2 kg)  Height: 5\' 7"  (1.702 m)   Body mass index is 23.9 kg/m.  Generalized: Well developed, in no acute distress , well-groomed Head: normocephalic and atraumatic,. Oropharynx benign  Neck: Supple,   Musculoskeletal: No deformity   Neurological examination   Mentation: Alert oriented to time, place, history taking. Attention span and concentration appropriate.   Follows all commands speech and language fluent.  Cranial nerve II-XII: Pupils were equal round reactive to light extraocular movements were full, visual field were full on confrontational test. Facial sensation and strength were normal. hearing was intact to finger rubbing bilaterally. Uvula tongue midline. head turning and shoulder shrug were normal and symmetric.Tongue protrusion into cheek strength was normal. Motor: normal bulk and tone,mild bilateral hip flexion weakness Sensory: length dependent decreased light touch and pinprick and vibratory sensation in the lower extremities  Coordination: finger-nose-finger, heel-to-shin bilaterally, no dysmetria Reflexes:  symmetric  upper and lower  plantar responses were flexor bilaterally. Gait and Station: Rising up from seated position without assistance, cautious gait  ,  No assistive device  DIAGNOSTIC DATA (LABS, IMAGING, TESTING) - I reviewed patient records, labs, notes, testing and imaging myself where available.  Lab Results  Component Value Date   WBC 5.5 05/26/2018   HGB 11.3 (L) 05/26/2018   HCT 35.1 (L) 05/26/2018   MCV 89.3 05/26/2018   PLT 289 05/26/2018      Component Value Date/Time   NA 141 05/26/2018 1820   K 3.9 05/26/2018 1820   CL 108 05/26/2018 1820   CO2 24 05/26/2018 1820   GLUCOSE 98 05/26/2018 1820   BUN 14 05/26/2018 1820   CREATININE 1.30 (H) 05/26/2018 1820   CALCIUM 9.4 05/26/2018 1820   PROT 8.2 (H) 05/26/2018 1820   ALBUMIN 3.9 05/26/2018 1820   AST 30 05/26/2018 1820   ALT 23 05/26/2018 1820   ALKPHOS 175 (H) 05/26/2018 1820   BILITOT 0.7 05/26/2018 1820   GFRNONAA 43 (L) 05/26/2018 1820   GFRAA 50 (L) 05/26/2018 1820    Lab Results  Component Value Date   HGBA1C 7.7 (A) 07/12/2018       ASSESSMENT AND PLAN  62 y.o. year old female  has a past medical history of seizure disorder with last seizure occurring in November 2016, Another seizure Dec 2017after being off of her Keppra. Memory  loss with MRI of the brain with evidence of generalized atrophy, mood disorder likely plays a role as well. Multifactorial gait disorder from deconditioning and diabetic peripheral neuropathy.    PLAN: Continue Keppra 750 twice daily  Blood sugars in better control recent insulin adjustments Follow-up in1 year Call for seizure activity Dennie Bible, [facility], [facility], APRN  [facility] [address], [place], [place] [address] [phone]

## 2018-08-09 ENCOUNTER — Encounter: Payer: Self-pay | Admitting: Nurse Practitioner

## 2018-08-09 ENCOUNTER — Telehealth: Payer: Self-pay | Admitting: Nurse Practitioner

## 2018-08-09 ENCOUNTER — Ambulatory Visit (INDEPENDENT_AMBULATORY_CARE_PROVIDER_SITE_OTHER): Payer: 59 | Admitting: Nurse Practitioner

## 2018-08-09 VITALS — BP 138/91 | HR 89 | Ht 67.0 in | Wt 152.6 lb

## 2018-08-09 DIAGNOSIS — G40909 Epilepsy, unspecified, not intractable, without status epilepticus: Secondary | ICD-10-CM | POA: Diagnosis not present

## 2018-08-09 MED ORDER — LEVETIRACETAM 750 MG PO TABS: 750.0000 mg | ORAL_TABLET | Freq: Two times a day (BID) | ORAL | 3 refills | Status: DC

## 2018-08-09 NOTE — Progress Notes (Signed)
I have reviewed and agreed above plan. 

## 2018-08-09 NOTE — Patient Instructions (Signed)
Continue Keppra 750 twice daily  Blood sugars in better control recent insulin adjustments Follow-up in1 year Call for seizure activity

## 2018-08-09 NOTE — Telephone Encounter (Signed)
Patient states she will return as needed for any follow-ups.

## 2018-11-22 ENCOUNTER — Telehealth: Payer: Self-pay

## 2018-11-22 NOTE — Telephone Encounter (Signed)
Received lab results. Reviewed by Dr. Loanne Drilling with a note indicating pt is overdue for an office visit. Called pt to schedule appt. LVM requesting returned call.

## 2018-12-13 ENCOUNTER — Ambulatory Visit (INDEPENDENT_AMBULATORY_CARE_PROVIDER_SITE_OTHER): Payer: 59 | Admitting: Endocrinology

## 2018-12-13 ENCOUNTER — Encounter: Payer: Self-pay | Admitting: Endocrinology

## 2018-12-13 VITALS — BP 158/90 | HR 105 | Ht 67.0 in | Wt 161.4 lb

## 2018-12-13 DIAGNOSIS — E118 Type 2 diabetes mellitus with unspecified complications: Secondary | ICD-10-CM

## 2018-12-13 LAB — POCT GLYCOSYLATED HEMOGLOBIN (HGB A1C): Hemoglobin A1C: 7 % — AB (ref 4.0–5.6)

## 2018-12-13 MED ORDER — INSULIN GLARGINE 100 UNIT/ML SOLOSTAR PEN: 26.0000 [IU] | PEN_INJECTOR | SUBCUTANEOUS | 3 refills | Status: DC

## 2018-12-13 NOTE — Progress Notes (Signed)
Subjective:    Patient ID: Tricia Huynh, female    DOB: 05/01/1956, 63 y.o.   MRN: [id number]  HPI Pt returns for f/u of diabetes mellitus: DM type: 1 Dx'ed: 7858 Complications: polyneuropathy Therapy: insulin since 2010.   GDM: never DKA: once, in late 2017 Severe hypoglycemia: once, in 2017.   Pancreatitis: never.  Other: She takes QD insulin, due to noncompliance; insurance declined V-GO.  Interval history: She says she never misses the insulin. no cbg record, but states cbg's vary from 54-100's.  pt states she feels well in general.   Past Medical History:  Diagnosis Date  . Diabetes mellitus type 2 in nonobese ([place]) 06/06/2015  . Diabetes mellitus without complication ([place])   . Diabetic neuropathy ([place]) 06/06/2015  . Dyslipidemia 06/06/2015  . Encephalopathy   . Essential hypertension 06/06/2015  . Hypertension   . Mood disorder ([place]) 06/06/2015  . Pneumonia 03/11/2017  . Seizure [facility])    sees [name]. abd eeg, on keppra    Past Surgical History:  Procedure Laterality Date  . ABDOMINAL HYSTERECTOMY    . EYE SURGERY Bilateral   . TUBAL LIGATION      Social History   Socioeconomic History  . Marital status: Single    Spouse name: Not on file  . Number of children: 3  . Years of education: 10  . Highest education level: Not on file  Occupational History  . Occupation: Disabled  Social Needs  . Financial resource strain: Not on file  . Food insecurity:    Worry: Not on file    Inability: Not on file  . Transportation needs:    Medical: Not on file    Non-medical: Not on file  Tobacco Use  . Smoking status: Former Smoker    Types: Cigarettes  . Smokeless tobacco: Never Used  Substance and Sexual Activity  . Alcohol use: No  . Drug use: No  . Sexual activity: Yes  Lifestyle  . Physical activity:    Days per week: Not on file    Minutes per session: Not on file  . Stress: Not on file  Relationships  . Social connections:    Talks on phone: Not  on file    Gets together: Not on file    Attends religious service: Not on file    Active member of club or organization: Not on file    Attends meetings of clubs or organizations: Not on file    Relationship status: Not on file  . Intimate partner violence:    Fear of current or ex partner: Not on file    Emotionally abused: Not on file    Physically abused: Not on file    Forced sexual activity: Not on file  Other Topics Concern  . Not on file  Social History Narrative   Lives at home with her grandchildren.   Right-handed.   No caffeine use.    Current Outpatient Medications on File Prior to Visit  Medication Sig Dispense Refill  . acetaminophen (TYLENOL) 500 MG tablet Take 1,000 mg by mouth every 6 (six) hours as needed for mild pain.    Marland Kitchen amitriptyline (ELAVIL) 50 MG tablet Take 50 mg by mouth at bedtime.  1  . amLODipine-olmesartan (AZOR) 5-40 MG tablet Take 1 tablet by mouth daily.    Marland Kitchen aspirin EC 81 MG tablet Take 81 mg by mouth daily.    Marland Kitchen atorvastatin (LIPITOR) 40 MG tablet Take 40 mg by mouth daily.  1  . carbamide peroxide (DEBROX) 6.5 % OTIC solution Place 5 drops into both ears 2 (two) times daily. 15 mL 0  . Insulin Pen Needle 31G X 5 MM MISC Used to give daily insulin injections. 100 each prn  . levETIRAcetam (KEPPRA) 750 MG tablet Take 1 tablet (750 mg total) by mouth 2 (two) times daily. 180 tablet 3  . metoprolol succinate (TOPROL-XL) 25 MG 24 hr tablet Take 50 mg by mouth at bedtime.  1  . ondansetron (ZOFRAN ODT) 4 MG disintegrating tablet Take 1 tablet (4 mg total) by mouth every 8 (eight) hours as needed for nausea or vomiting. 20 tablet 0  . vitamin E 400 UNIT capsule Take 400 Units by mouth daily.    . famotidine (PEPCID) 20 MG tablet Take 1 tablet (20 mg total) by mouth 2 (two) times daily for 5 days. 10 tablet 0  . pantoprazole (PROTONIX) 20 MG tablet Take 1 tablet (20 mg total) by mouth daily. 30 tablet 1   No current facility-administered medications on  file prior to visit.     Allergies  Allergen Reactions  . Percocet [Oxycodone-Acetaminophen] Nausea And Vomiting and Other (See Comments)    Shaky/unsteady.  . Vicodin [Hydrocodone-Acetaminophen] Nausea And Vomiting and Other (See Comments)    Shaky/unsteady.    Family History  Problem Relation Age of Onset  . Heart disease Father   . Kidney disease Father   . Diabetes Mother   . Seizures Sister   . Seizures Brother     BP (!) 158/90 (BP Location: Left Arm, Patient Position: Sitting, Cuff Size: Normal)   Pulse (!) 105   Ht 5\' 7"  (1.702 m)   Wt 161 lb 6.4 oz (73.2 kg)   SpO2 96%   BMI 25.28 kg/m    Review of Systems Denies LOC.     Objective:   Physical Exam VITAL SIGNS:  See vs page GENERAL: no distress Pulses: dorsalis pedis intact bilat.   MSK: no deformity of the feet CV: no leg edema Skin:  no ulcer on the feet.  normal color and temp on the feet. Neuro: sensation is intact to touch on the feet   Lab Results  Component Value Date   HGBA1C 7.0 (A) 12/13/2018      Assessment & Plan:  Type 1 DM, with PN: overcontrolled, given this insulin regimen, which does match insulin to her changing needs throughout the day. Hypoglycemia: this limits aggressiveness of glycemic control.  HTN: is noted today.  Patient Instructions  Your blood pressure is high today.  Please see your primary care provider soon, to have it rechecked.   check your blood sugar twice a day.  vary the time of day when you check, between before the 3 meals, and at bedtime.  also check if you have symptoms of your blood sugar being too high or too low.  please keep a record of the readings and bring it to your next appointment here (or you can bring the meter itself).  You can write it on any piece of paper.  please call us sooner if your blood sugar goes below 70, or if you have a lot of readings over 200. Please reduce the insulin to 26 units each morning.   On this type of insulin schedule, you  should eat meals on a regular schedule.  If a meal is missed or significantly delayed, your blood sugar could go low.  also, you may need to eat a light snack,  to avoid it going low in the early morning.

## 2018-12-13 NOTE — Patient Instructions (Addendum)
Your blood pressure is high today.  Please see your primary care provider soon, to have it rechecked.   check your blood sugar twice a day.  vary the time of day when you check, between before the 3 meals, and at bedtime.  also check if you have symptoms of your blood sugar being too high or too low.  please keep a record of the readings and bring it to your next appointment here (or you can bring the meter itself).  You can write it on any piece of paper.  please call us sooner if your blood sugar goes below 70, or if you have a lot of readings over 200. Please reduce the insulin to 26 units each morning.   On this type of insulin schedule, you should eat meals on a regular schedule.  If a meal is missed or significantly delayed, your blood sugar could go low.  also, you may need to eat a light snack, to avoid it going low in the early morning.

## 2018-12-14 ENCOUNTER — Encounter (HOSPITAL_COMMUNITY): Payer: Self-pay

## 2018-12-14 ENCOUNTER — Emergency Department (HOSPITAL_COMMUNITY): Payer: 59

## 2018-12-14 ENCOUNTER — Emergency Department (HOSPITAL_COMMUNITY)
Admission: EM | Admit: 2018-12-14 | Discharge: 2018-12-15 | Disposition: A | Discharge status: Home / Self Care | Payer: 59 | Attending: Emergency Medicine | Admitting: Emergency Medicine

## 2018-12-14 ENCOUNTER — Other Ambulatory Visit: Payer: Self-pay

## 2018-12-14 DIAGNOSIS — N183 Chronic kidney disease, stage 3 (moderate): Secondary | ICD-10-CM | POA: Diagnosis not present

## 2018-12-14 DIAGNOSIS — E1122 Type 2 diabetes mellitus with diabetic chronic kidney disease: Secondary | ICD-10-CM | POA: Insufficient documentation

## 2018-12-14 DIAGNOSIS — Z794 Long term (current) use of insulin: Secondary | ICD-10-CM | POA: Diagnosis not present

## 2018-12-14 DIAGNOSIS — Z79899 Other long term (current) drug therapy: Secondary | ICD-10-CM | POA: Diagnosis not present

## 2018-12-14 DIAGNOSIS — Z87891 Personal history of nicotine dependence: Secondary | ICD-10-CM | POA: Insufficient documentation

## 2018-12-14 DIAGNOSIS — I129 Hypertensive chronic kidney disease with stage 1 through stage 4 chronic kidney disease, or unspecified chronic kidney disease: Secondary | ICD-10-CM | POA: Insufficient documentation

## 2018-12-14 DIAGNOSIS — R319 Hematuria, unspecified: Secondary | ICD-10-CM | POA: Insufficient documentation

## 2018-12-14 DIAGNOSIS — R109 Unspecified abdominal pain: Secondary | ICD-10-CM | POA: Diagnosis present

## 2018-12-14 DIAGNOSIS — Z7982 Long term (current) use of aspirin: Secondary | ICD-10-CM | POA: Diagnosis not present

## 2018-12-14 LAB — CBC WITH DIFFERENTIAL/PLATELET
Abs Immature Granulocytes: 0.03 10*3/uL (ref 0.00–0.07)
Basophils Absolute: 0 10*3/uL (ref 0.0–0.1)
Basophils Relative: 0 %
Eosinophils Absolute: 0 10*3/uL (ref 0.0–0.5)
Eosinophils Relative: 0 %
HCT: 31.6 % — ABNORMAL LOW (ref 36.0–46.0)
Hemoglobin: 10.1 g/dL — ABNORMAL LOW (ref 12.0–15.0)
Immature Granulocytes: 1 %
Lymphocytes Relative: 30 %
Lymphs Abs: 1.9 10*3/uL (ref 0.7–4.0)
MCH: 28.9 pg (ref 26.0–34.0)
MCHC: 32 g/dL (ref 30.0–36.0)
MCV: 90.5 fL (ref 80.0–100.0)
Monocytes Absolute: 0.4 10*3/uL (ref 0.1–1.0)
Monocytes Relative: 6 %
Neutro Abs: 4.1 10*3/uL (ref 1.7–7.7)
Neutrophils Relative %: 63 %
Platelets: 306 10*3/uL (ref 150–400)
RBC: 3.49 MIL/uL — ABNORMAL LOW (ref 3.87–5.11)
RDW: 12 % (ref 11.5–15.5)
WBC: 6.4 10*3/uL (ref 4.0–10.5)
nRBC: 0 % (ref 0.0–0.2)

## 2018-12-14 LAB — BASIC METABOLIC PANEL
Anion gap: 7 (ref 5–15)
BUN: 26 mg/dL — ABNORMAL HIGH (ref 8–23)
CO2: 26 mmol/L (ref 22–32)
Calcium: 9.5 mg/dL (ref 8.9–10.3)
Chloride: 106 mmol/L (ref 98–111)
Creatinine, Ser: 1.15 mg/dL — ABNORMAL HIGH (ref 0.44–1.00)
GFR calc Af Amer: 59 mL/min — ABNORMAL LOW (ref >60)
GFR calc non Af Amer: 51 mL/min — ABNORMAL LOW (ref >60)
Glucose, Bld: 76 mg/dL (ref 70–99)
Potassium: 3.8 mmol/L (ref 3.5–5.1)
Sodium: 139 mmol/L (ref 135–145)

## 2018-12-14 LAB — URINALYSIS, ROUTINE W REFLEX MICROSCOPIC

## 2018-12-14 LAB — URINALYSIS, MICROSCOPIC (REFLEX)
Bacteria, UA: NONE SEEN
RBC / HPF: >50 RBC/hpf (ref 0–5)

## 2018-12-14 MED ORDER — ONDANSETRON HCL 4 MG/2ML IJ SOLN
4.0000 mg | Freq: Once | INTRAMUSCULAR | Status: AC
  Administered 2018-12-14: 4 mg via INTRAVENOUS
  Filled 2018-12-14: qty 2

## 2018-12-14 MED ORDER — MORPHINE SULFATE (PF) 4 MG/ML IV SOLN
4.0000 mg | Freq: Once | INTRAVENOUS | Status: AC
  Administered 2018-12-14: 4 mg via INTRAVENOUS
  Filled 2018-12-14: qty 1

## 2018-12-14 NOTE — Discharge Instructions (Signed)
Your CT scan shows evidence of a recently passed kidney stone which is likely what is causing the blood in your urine.  Your urine does not appear infected today.  I am reassured that your pain has resolved, you can take Tylenol as needed for pain please follow-up with your primary care doctor and if the blood in your urine persist please follow-up with urology for further evaluation.  Return to the emergency department for evaluation of fevers, worsening flank pain, persistent vomiting or any other new or concerning symptoms.

## 2018-12-14 NOTE — ED Triage Notes (Addendum)
Per EMS- patient c/o right flank pain. Patient had hematuria 1 week ago and was prescribed Keflex. Patient has taken 1/2 prescription and developed right flank pain today.  Patient gave U/a specimen in triage-urine bloody with clots.

## 2018-12-14 NOTE — ED Provider Notes (Signed)
[facility] DEPT Provider Note   CSN: [id number] Arrival date & time: 12/14/18  1545     History   Chief Complaint Chief Complaint  Patient presents with  . Flank Pain  . Hematuria    HPI Tricia Huynh is a 63 y.o. female.  Tricia Huynh is a 63 y.o. female with a history of diabetes, hypertension, hyperlipidemia and seizures, who presents to the emergency department for evaluation of right-sided flank pain.  She reports that pain started acutely this afternoon and was constant in nature, but resolved since arriving here in the emergency department waiting for evaluation.  She reports she is also had hematuria.  Patient was recently diagnosed with a urinary tract infection after noticing blood in her urine last week, was treated with Keflex and reports next day symptoms seem to be improving and blood in her urine resolved she did not have any flank pain at this time but today developed sudden onset of flank pain and blood in her urine returned.  She denies any associated nausea or vomiting, no fevers or chills.  She is not having any burning or discomfort with urination anymore since being on antibiotics.  She denies any anterior abdominal pain.  No diarrhea, melena or hematochezia.  No vaginal bleeding or discharge.  No chest pain or shortness of breath.  She is taken nothing for symptoms prior to arrival, denies any other aggravating or alleviating factors.  No history of kidney stones.     Past Medical History:  Diagnosis Date  . Diabetes mellitus type 2 in nonobese ([place]) 06/06/2015  . Diabetes mellitus without complication ([place])   . Diabetic neuropathy ([place]) 06/06/2015  . Dyslipidemia 06/06/2015  . Encephalopathy   . Essential hypertension 06/06/2015  . Hypertension   . Mood disorder ([place]) 06/06/2015  . Pneumonia 03/11/2017  . Seizure [facility])    sees [name]. abd eeg, on keppra    Patient Active Problem List   Diagnosis Date Noted  .  HCAP (healthcare-associated pneumonia) 03/11/2017  . Dyslipidemia 03/11/2017  . Diabetes mellitus with complication ([place])   . Chest pain 12/30/2016  . Seizure disorder ([place]) 12/30/2016  . Diabetic ketoacidosis without coma associated with type 2 diabetes mellitus ([place])   . DKA (diabetic ketoacidoses) ([place]) 11/15/2016  . UTI (urinary tract infection) 11/15/2016  . Acute cystitis without hematuria   . Acute renal failure ([place])   . Hyperglycemia 11/14/2016  . Diabetes ([place]) 06/05/2016  . Altered mental status   . CKD (chronic kidney disease) stage 3, GFR 30-59 ml/min (HCC) 05/26/2016  . Benign essential HTN 05/26/2016  . Seizures ([place]) 01/08/2016  . Memory loss 10/04/2015  . Acute encephalopathy 06/06/2015  . Diabetic neuropathy ([place]) 06/06/2015    Past Surgical History:  Procedure Laterality Date  . ABDOMINAL HYSTERECTOMY    . EYE SURGERY Bilateral   . TUBAL LIGATION       OB History   No obstetric history on file.      Home Medications    Prior to Admission medications   Medication Sig Start Date End Date Taking? Authorizing Provider  acetaminophen (TYLENOL) 500 MG tablet Take 1,000 mg by mouth every 6 (six) hours as needed for mild pain.   Yes [provider]  amitriptyline (ELAVIL) 50 MG tablet Take 50 mg by mouth at bedtime. 05/08/15  Yes [provider]  amLODipine-olmesartan (AZOR) 5-40 MG tablet Take 1 tablet by mouth daily. 10/08/16  Yes [provider]  aspirin EC  81 MG tablet Take 81 mg by mouth daily.   Yes [provider]  atorvastatin (LIPITOR) 40 MG tablet Take 40 mg by mouth daily. 05/10/15  Yes [provider]  Insulin Glargine (LANTUS SOLOSTAR) 100 UNIT/ML Solostar Pen Inject 26 Units into the skin every morning. 12/13/18  Yes Renato Shin, MD  levETIRAcetam (KEPPRA) 750 MG tablet Take 1 tablet (750 mg total) by mouth 2 (two) times daily. 08/09/18  Yes Dennie Bible, NP  metoprolol succinate (TOPROL-XL) 50  MG 24 hr tablet Take 50 mg by mouth at bedtime.  05/08/15  Yes [provider]  vitamin E 400 UNIT capsule Take 400 Units by mouth daily.   Yes [provider]  carbamide peroxide (DEBROX) 6.5 % OTIC solution Place 5 drops into both ears 2 (two) times daily. Patient not taking: Reported on 12/14/2018 07/17/18   Domenic Moras, PA-C  famotidine (PEPCID) 20 MG tablet Take 1 tablet (20 mg total) by mouth 2 (two) times daily for 5 days. Patient not taking: Reported on 12/14/2018 05/27/18 06/01/18  Joy, Raquel Sarna C, PA-C  Insulin Pen Needle 31G X 5 MM MISC Used to give daily insulin injections. 07/01/18   Renato Shin, MD  ondansetron (ZOFRAN ODT) 4 MG disintegrating tablet Take 1 tablet (4 mg total) by mouth every 8 (eight) hours as needed for nausea or vomiting. Patient not taking: Reported on 12/14/2018 05/27/18   Joy, Raquel Sarna C, PA-C  pantoprazole (PROTONIX) 20 MG tablet Take 1 tablet (20 mg total) by mouth daily. Patient not taking: Reported on 12/14/2018 05/27/18 07/26/18  Lorayne Bender, PA-C    Family History Family History  Problem Relation Age of Onset  . Heart disease Father   . Kidney disease Father   . Diabetes Mother   . Seizures Sister   . Seizures Brother     Social History Social History   Tobacco Use  . Smoking status: Former Smoker    Types: Cigarettes  . Smokeless tobacco: Never Used  Substance Use Topics  . Alcohol use: No  . Drug use: No     Allergies   Percocet [oxycodone-acetaminophen] and Vicodin [hydrocodone-acetaminophen]   Review of Systems Review of Systems  Constitutional: Negative for chills and fever.  HENT: Negative.   Respiratory: Negative for cough and shortness of breath.   Cardiovascular: Negative for chest pain.  Gastrointestinal: Negative for abdominal pain, blood in stool, constipation, diarrhea, nausea and vomiting.  Genitourinary: Positive for flank pain and hematuria. Negative for dysuria, frequency, vaginal bleeding and vaginal  discharge.  Musculoskeletal: Positive for back pain. Negative for arthralgias and myalgias.  Skin: Negative for color change and rash.  Neurological: Negative for dizziness, syncope and light-headedness.  All other systems reviewed and are negative.    Physical Exam Updated Vital Signs BP (!) 158/105 (BP Location: Right Arm)   Pulse (!) 106   Temp 98.1 F (36.7 C) (Oral)   Resp 16   Ht 5\' 7"  (1.702 m)   Wt 73.2 kg   SpO2 100%   BMI 25.28 kg/m   Physical Exam Vitals signs and nursing note reviewed.  Constitutional:      General: She is not in acute distress.    Appearance: Normal appearance. She is well-developed. She is not ill-appearing or diaphoretic.  HENT:     Head: Normocephalic and atraumatic.     Mouth/Throat:     Mouth: Mucous membranes are moist.     Pharynx: Oropharynx is clear.  Eyes:  General:        Right eye: No discharge.        Left eye: No discharge.     Pupils: Pupils are equal, round, and reactive to light.  Neck:     Musculoskeletal: Neck supple.  Cardiovascular:     Rate and Rhythm: Normal rate and regular rhythm.     Pulses: Normal pulses.     Heart sounds: Normal heart sounds. No murmur. No friction rub. No gallop.   Pulmonary:     Effort: Pulmonary effort is normal. No respiratory distress.     Breath sounds: Normal breath sounds. No wheezing or rales.     Comments: Respirations equal and unlabored, patient able to speak in full sentences, lungs clear to auscultation bilaterally Abdominal:     General: Bowel sounds are normal. There is no distension.     Palpations: Abdomen is soft. There is no mass.     Tenderness: There is no abdominal tenderness. There is no right CVA tenderness or guarding.     Comments: Abdomen is soft and nondistended, bowel sounds present throughout, there is no reproducible pain or tenderness throughout the abdomen, no peritoneal signs, bilateral flanks without CVA tenderness.  Musculoskeletal:        General: No  deformity.  Skin:    General: Skin is warm and dry.     Capillary Refill: Capillary refill takes less than 2 seconds.  Neurological:     Mental Status: She is alert.     Coordination: Coordination normal.     Comments: Speech is clear, able to follow commands Moves extremities without ataxia, coordination intact   Psychiatric:        Mood and Affect: Mood normal.        Behavior: Behavior normal.      ED Treatments / Results  Labs (all labs ordered are listed, but only abnormal results are displayed) Labs Reviewed  URINALYSIS, ROUTINE W REFLEX MICROSCOPIC - Abnormal; Notable for the following components:      Result Value   Color, Urine RED (*)    APPearance TURBID (*)    Glucose, UA   (*)    Value: TEST NOT REPORTED DUE TO COLOR INTERFERENCE OF URINE PIGMENT   Hgb urine dipstick   (*)    Value: TEST NOT REPORTED DUE TO COLOR INTERFERENCE OF URINE PIGMENT   Bilirubin Urine   (*)    Value: TEST NOT REPORTED DUE TO COLOR INTERFERENCE OF URINE PIGMENT   Ketones, ur   (*)    Value: TEST NOT REPORTED DUE TO COLOR INTERFERENCE OF URINE PIGMENT   Protein, ur   (*)    Value: TEST NOT REPORTED DUE TO COLOR INTERFERENCE OF URINE PIGMENT   Nitrite   (*)    Value: TEST NOT REPORTED DUE TO COLOR INTERFERENCE OF URINE PIGMENT   Leukocytes, UA   (*)    Value: TEST NOT REPORTED DUE TO COLOR INTERFERENCE OF URINE PIGMENT   All other components within normal limits  BASIC METABOLIC PANEL - Abnormal; Notable for the following components:   BUN 26 (*)    Creatinine, Ser 1.15 (*)    GFR calc non Af Amer 51 (*)    GFR calc Af Amer 59 (*)    All other components within normal limits  CBC WITH DIFFERENTIAL/PLATELET - Abnormal; Notable for the following components:   RBC 3.49 (*)    Hemoglobin 10.1 (*)    HCT 31.6 (*)    All  other components within normal limits  URINE CULTURE  URINALYSIS, MICROSCOPIC (REFLEX)    EKG None  Radiology Ct Renal Stone Study  Result Date:  12/14/2018 CLINICAL DATA:  Hematuria right-sided flank pain EXAM: CT ABDOMEN AND PELVIS WITHOUT CONTRAST TECHNIQUE: Multidetector CT imaging of the abdomen and pelvis was performed following the standard protocol without IV contrast. COMPARISON:  CT 10/17/2014 FINDINGS: Lower chest: Lung bases demonstrate atelectasis at the lingula. No pleural effusion. Normal heart size. Hepatobiliary: No focal liver abnormality is seen. No gallstones, gallbladder wall thickening, or biliary dilatation. Pancreas: Unremarkable. No pancreatic ductal dilatation or surrounding inflammatory changes. Spleen: Normal in size without focal abnormality. Adrenals/Urinary Tract: Adrenal glands are normal. Mild right hydronephrosis and hydroureter with stranding around the right ureter. No definitive right ureteral stone. The bladder is normal Stomach/Bowel: Stomach is within normal limits. Appendix appears normal. No evidence of bowel wall thickening, distention, or inflammatory changes. Vascular/Lymphatic: No significant vascular findings are present. No enlarged abdominal or pelvic lymph nodes. Reproductive: Status post hysterectomy. No adnexal masses. Other: Negative for free air or free fluid. Musculoskeletal: No acute or significant osseous findings. IMPRESSION: 1. Mild right hydronephrosis and hydroureter but without definitive ureteral stone visualized. Findings could be secondary to recently passed stone versus ascending urinary tract infection. 2. Otherwise no CT evidence for acute intra-abdominal or pelvic abnormality. Electronically Signed   By: Donavan Foil M.D.   On: 12/14/2018 21:03    Procedures Procedures (including critical care time)  Medications Ordered in ED Medications  morphine 4 MG/ML injection 4 mg (4 mg Intravenous Given 12/14/18 2223)  ondansetron (ZOFRAN) injection 4 mg (4 mg Intravenous Given 12/14/18 2223)     Initial Impression / Assessment and Plan / ED Course  I have reviewed the triage vital signs  and the nursing notes.  Pertinent labs & imaging results that were available during my care of the patient were reviewed by me and considered in my medical decision making (see chart for details).  Patient presents for evaluation of right flank pain and hematuria.  Was recently treated for urinary tract infection after patient noted blood in her urine and reports this resolved until patient developed sudden onset of right-sided flank pain and again noted blood in her urine today.  She denies any fevers or chills, no burning or discomfort with urination.  No associated nausea or vomiting.  On arrival patient is mildly tachycardic and hypertensive but vitals otherwise unremarkable.  On exam abdomen is benign and patient has no focal CVA tenderness.  Presentation concerning for renal stone especially with sudden onset of pain although patient's pain did resolved while she was waiting for evaluation here in the emergency department and I wonder if patient stone passed during this time.  Given patient's recent UTI concerning also for worsening infection and pyelonephritis.  We will get basic labs, urinalysis and urine culture and CT renal stone study.  Medications given for symptomatic management.  Urinalysis with grossly bloody urine unable to interpret other results due to urine discoloration but microscopic reveals greater than 50 RBCs with a few WBCs and no bacteria present, this is increasingly indicative of renal stone as patient's urinary tract infection appears to have completely resolved, with no bacteria present in the urine.  Urine culture collected.  CBC with stable hemoglobin and no leukocytosis.  No acute electrolyte derangements creatinine of 1.5 which is actually improved from patient's previous creatinine.  Stone study with mild right hydronephrosis and hydroureter without obvious ureteral stone findings suggestive  of recently passed stone versus a standing UTI and without any signs of UTI on UA  and no leukocytosis I favor this being a recently passed stone.  I have discussed results with patient she remains comfortable without focal pain discussed with her that she can have intermittent pains for up to 24 hours after passed stone and if hematuria persist she will need to follow-up with urology.  Return precautions discussed.  Patient expresses understanding and agreement with plan.  Discharged home in good condition.  Final Clinical Impressions(s) / ED Diagnoses   Final diagnoses:  Right flank pain  Hematuria, unspecified type    ED Discharge Orders    None       Janet Berlin 12/19/18 0139    Lacretia Leigh, MD 12/19/18 [phone]

## 2018-12-16 LAB — URINE CULTURE: Culture: NO GROWTH

## 2019-01-07 ENCOUNTER — Encounter (HOSPITAL_COMMUNITY): Payer: Self-pay

## 2019-01-07 ENCOUNTER — Other Ambulatory Visit: Payer: Self-pay

## 2019-01-07 ENCOUNTER — Emergency Department (HOSPITAL_COMMUNITY): Payer: 59

## 2019-01-07 ENCOUNTER — Emergency Department (HOSPITAL_COMMUNITY)
Admission: EM | Admit: 2019-01-07 | Discharge: 2019-01-07 | Disposition: A | Discharge status: Home / Self Care | Payer: 59 | Attending: Emergency Medicine | Admitting: Emergency Medicine

## 2019-01-07 DIAGNOSIS — Z7982 Long term (current) use of aspirin: Secondary | ICD-10-CM | POA: Insufficient documentation

## 2019-01-07 DIAGNOSIS — Z794 Long term (current) use of insulin: Secondary | ICD-10-CM | POA: Insufficient documentation

## 2019-01-07 DIAGNOSIS — Z79899 Other long term (current) drug therapy: Secondary | ICD-10-CM | POA: Insufficient documentation

## 2019-01-07 DIAGNOSIS — N183 Chronic kidney disease, stage 3 (moderate): Secondary | ICD-10-CM | POA: Insufficient documentation

## 2019-01-07 DIAGNOSIS — R600 Localized edema: Secondary | ICD-10-CM | POA: Diagnosis not present

## 2019-01-07 DIAGNOSIS — R0602 Shortness of breath: Secondary | ICD-10-CM | POA: Diagnosis present

## 2019-01-07 DIAGNOSIS — E1140 Type 2 diabetes mellitus with diabetic neuropathy, unspecified: Secondary | ICD-10-CM | POA: Insufficient documentation

## 2019-01-07 DIAGNOSIS — E1122 Type 2 diabetes mellitus with diabetic chronic kidney disease: Secondary | ICD-10-CM | POA: Insufficient documentation

## 2019-01-07 DIAGNOSIS — I129 Hypertensive chronic kidney disease with stage 1 through stage 4 chronic kidney disease, or unspecified chronic kidney disease: Secondary | ICD-10-CM | POA: Diagnosis not present

## 2019-01-07 DIAGNOSIS — Z87891 Personal history of nicotine dependence: Secondary | ICD-10-CM | POA: Diagnosis not present

## 2019-01-07 LAB — COMPREHENSIVE METABOLIC PANEL
ALT: 22 U/L (ref 0–44)
AST: 24 U/L (ref 15–41)
Albumin: 3.5 g/dL (ref 3.5–5.0)
Alkaline Phosphatase: 175 U/L — ABNORMAL HIGH (ref 38–126)
Anion gap: 6 (ref 5–15)
BUN: 14 mg/dL (ref 8–23)
CO2: 25 mmol/L (ref 22–32)
Calcium: 9.4 mg/dL (ref 8.9–10.3)
Chloride: 108 mmol/L (ref 98–111)
Creatinine, Ser: 1.16 mg/dL — ABNORMAL HIGH (ref 0.44–1.00)
GFR calc Af Amer: 58 mL/min — ABNORMAL LOW (ref >60)
GFR calc non Af Amer: 50 mL/min — ABNORMAL LOW (ref >60)
Glucose, Bld: 164 mg/dL — ABNORMAL HIGH (ref 70–99)
Potassium: 3.7 mmol/L (ref 3.5–5.1)
Sodium: 139 mmol/L (ref 135–145)
Total Bilirubin: 0.3 mg/dL (ref 0.3–1.2)
Total Protein: 8 g/dL (ref 6.5–8.1)

## 2019-01-07 LAB — CBC WITH DIFFERENTIAL/PLATELET
Abs Immature Granulocytes: 0.01 K/uL (ref 0.00–0.07)
Basophils Absolute: 0 K/uL (ref 0.0–0.1)
Basophils Relative: 0 %
Eosinophils Absolute: 0 K/uL (ref 0.0–0.5)
Eosinophils Relative: 0 %
HCT: 30.7 % — ABNORMAL LOW (ref 36.0–46.0)
Hemoglobin: 9.7 g/dL — ABNORMAL LOW (ref 12.0–15.0)
Immature Granulocytes: 0 %
Lymphocytes Relative: 32 %
Lymphs Abs: 1.6 K/uL (ref 0.7–4.0)
MCH: 28.8 pg (ref 26.0–34.0)
MCHC: 31.6 g/dL (ref 30.0–36.0)
MCV: 91.1 fL (ref 80.0–100.0)
Monocytes Absolute: 0.4 K/uL (ref 0.1–1.0)
Monocytes Relative: 8 %
Neutro Abs: 2.9 K/uL (ref 1.7–7.7)
Neutrophils Relative %: 60 %
Platelets: 284 K/uL (ref 150–400)
RBC: 3.37 MIL/uL — ABNORMAL LOW (ref 3.87–5.11)
RDW: 12.4 % (ref 11.5–15.5)
WBC: 4.8 K/uL (ref 4.0–10.5)
nRBC: 0 % (ref 0.0–0.2)

## 2019-01-07 LAB — URINALYSIS, ROUTINE W REFLEX MICROSCOPIC
Bilirubin Urine: NEGATIVE
Glucose, UA: NEGATIVE mg/dL
Ketones, ur: NEGATIVE mg/dL
Nitrite: NEGATIVE
Protein, ur: 30 mg/dL — AB
Specific Gravity, Urine: 1.014 (ref 1.005–1.030)
pH: 5 (ref 5.0–8.0)

## 2019-01-07 LAB — BRAIN NATRIURETIC PEPTIDE: B Natriuretic Peptide: 16.6 pg/mL (ref 0.0–100.0)

## 2019-01-07 LAB — I-STAT TROPONIN, ED: Troponin i, poc: 0.01 ng/mL (ref 0.00–0.08)

## 2019-01-07 MED ORDER — METOPROLOL SUCCINATE ER 25 MG PO TB24
50.0000 mg | ORAL_TABLET | Freq: Every day | ORAL | Status: DC
  Administered 2019-01-07: 50 mg via ORAL
  Filled 2019-01-07: qty 2

## 2019-01-07 MED ORDER — AMLODIPINE BESYLATE 5 MG PO TABS
5.0000 mg | ORAL_TABLET | Freq: Once | ORAL | Status: AC
  Administered 2019-01-07: 5 mg via ORAL
  Filled 2019-01-07: qty 1

## 2019-01-07 NOTE — Discharge Instructions (Signed)
You have been seen today for shortness of breath. Please read and follow all provided instructions.   1. Medications: usual home medications 2. Treatment: rest, drink plenty of fluids 3. Follow Up: Please follow up with your primary doctor in 2 days for discussion of your diagnoses and further evaluation after today's visit; if you do not have a primary care doctor use the resource guide provided to find one; Please return to the ER for any new or worsening symptoms. Please obtain all of your results from medical records or have your doctors office obtain the results - share them with your doctor - you should be seen at your doctors office. Call today to arrange your follow up.   Take medications as prescribed. Please review all of the medicines and only take them if you do not have an allergy to them. Return to the emergency room for worsening condition or new concerning symptoms. Follow up with your regular doctor. If you don't have a regular doctor use one of the numbers below to establish a primary care doctor.  Please be aware that if you are taking birth control pills, taking other prescriptions, ESPECIALLY ANTIBIOTICS may make the birth control ineffective - if this is the case, either do not engage in sexual activity or use alternative methods of birth control such as condoms until you have finished the medicine and your family doctor says it is OK to restart them. If you are on a blood thinner such as COUMADIN, be aware that any other medicine that you take may cause the coumadin to either work too much, or not enough - you should have your coumadin level rechecked in next 7 days if this is the case.  ?  It is also a possibility that you have an allergic reaction to any of the medicines that you have been prescribed - Everybody reacts differently to medications and while MOST people have no trouble with most medicines, you may have a reaction such as nausea, vomiting, rash, swelling, shortness of  breath. If this is the case, please stop taking the medicine immediately and contact your physician.  ?  You should return to the ER if you develop severe or worsening symptoms.   Emergency Department Resource Guide 1) Find a Doctor and Pay Out of Pocket Although you won't have to find out who is covered by your insurance plan, it is a good idea to ask around and get recommendations. You will then need to call the office and see if the doctor you have chosen will accept you as a new patient and what types of options they offer for patients who are self-pay. Some doctors offer discounts or will set up payment plans for their patients who do not have insurance, but you will need to ask so you aren't surprised when you get to your appointment.  2) Contact Your Local Health Department Not all health departments have doctors that can see patients for sick visits, but many do, so it is worth a call to see if yours does. If you don't know where your local health department is, you can check in your phone book. The CDC also has a tool to help you locate your state's health department, and many state websites also have listings of all of their local health departments.  3) Find a [facility] If your illness is not likely to be very severe or complicated, you may want to try a walk in clinic. These are popping up all  over the country in pharmacies, drugstores, and shopping centers. They're usually staffed by nurse practitioners or physician assistants that have been trained to treat common illnesses and complaints. They're usually fairly quick and inexpensive. However, if you have serious medical issues or chronic medical problems, these are probably not your best option.  No Primary Care Doctor: Call Health Connect at  [phone] - they can help you locate a primary care doctor that  accepts your insurance, provides certain services, etc. Physician Referral [phone]  Emergency Department  Resource Guide 1) Find a Doctor and Pay Out of Pocket Although you won't have to find out who is covered by your insurance plan, it is a good idea to ask around and get recommendations. You will then need to call the office and see if the doctor you have chosen will accept you as a new patient and what types of options they offer for patients who are self-pay. Some doctors offer discounts or will set up payment plans for their patients who do not have insurance, but you will need to ask so you aren't surprised when you get to your appointment.  2) Contact Your Local Health Department Not all health departments have doctors that can see patients for sick visits, but many do, so it is worth a call to see if yours does. If you don't know where your local health department is, you can check in your phone book. The CDC also has a tool to help you locate your state's health department, and many state websites also have listings of all of their local health departments.  3) Find a [facility] If your illness is not likely to be very severe or complicated, you may want to try a walk in clinic. These are popping up all over the country in pharmacies, drugstores, and shopping centers. They're usually staffed by nurse practitioners or physician assistants that have been trained to treat common illnesses and complaints. They're usually fairly quick and inexpensive. However, if you have serious medical issues or chronic medical problems, these are probably not your best option.  No Primary Care Doctor: Call Health Connect at  [phone] - they can help you locate a primary care doctor that  accepts your insurance, provides certain services, etc. Physician Referral Service- [phone]  Chronic Pain Problems: Organization         Address  Phone   Notes  [facility]  [phone] Patients need to be referred by their primary care doctor.   Medication Assistance: Organization          Address  Phone   Notes  [facility] [address] [place], [place] [address] [phone] --Must be a resident of [facility] -- Must have NO insurance coverage whatsoever (no Medicaid/ Medicare, etc.) -- The pt. MUST have a primary care doctor that directs their care regularly and follows them in the community   [facility]  [phone]   [employer]  [phone]    Agencies that provide inexpensive medical care: Organization         Address  Phone   Notes  [place]  [phone]   [facility]    [phone]   [facility] [place], [place] [address] [phone]   [place] [address] [place] [phone]   [facility]anned [facility]    [phone]  [facility]    [phone]   [facility] and [facility]  [address], [place] Phone:  [phone], Fax:  [phone] Hours of Operation:  9 am - 6 pm, M-F.  Also accepts Medicaid/Medicare and self-pay.  [facility] for [place] [address], [address], [place] Phone: [phone], Fax: [phone]. Hours of Operation:  8:30 am - 5:30 pm, M-F.  Also accepts Medicaid and self-pay.  [facility] [address] [place] Phone: [phone]   [place], [place], [place] [phone], Ext. [phone] Mondays & Thursdays: 7-9 AM.  First 15 patients are seen on a first come, first serve basis.    [place] Providers:  Organization         Address  Phone   Notes  [facility] [address], [place] [phone] Also accepts self-pay patients.  [facility] [address], [place]  [phone]   [place], [address], [place] [phone]   [facility] [address] [place] [phone]   Lucianne Lei [address], [place]   [phone] Only accepts [place] [facility] [place] patients after they have their name applied to their card.   Self-Pay (no insurance) in [facility]:  Organization         Address  Phone   Notes  Sickle Cell Patients, [facility] Internal Medicine Protection [phone]   [facility] [place] [phone]   [facility] [place]  [place], [place], [place] [phone]   [facility]/Dr. Osei-Bonsu  [address] [place] or [address], [place] [phone] Phone number for both [place] and [place] locations is the same.  Urgent Medical and [facility] [address] [place] [phone]   [facility] [address], [place] or [address] [phone]   [facility] [address], [place] [phone], phone; [phone], fax Sees patients 1st and 3rd Saturday of every month.  Must not qualify for public or private insurance (i.e. Medicaid, Medicare,  Health Choice, Veterans' Benefits)  Household income should be no more than 200% of the poverty level The clinic cannot treat you if you are pregnant or think you are pregnant  Sexually transmitted diseases are not treated at the clinic.

## 2019-01-07 NOTE — ED Notes (Signed)
Called lab to check on BNP- lab "figuring it out"

## 2019-01-07 NOTE — ED Provider Notes (Signed)
[facility] EMERGENCY DEPARTMENT Provider Note   CSN: [id number] Arrival date & time: 01/07/19  7106    History   Chief Complaint Chief Complaint  Patient presents with  . Shortness of Breath    HPI Tricia Huynh is a 63 y.o. female with a PMH of Diabetes, HTN, seizures, and HLD presenting with intermittent shortness of breath with exertion onset 1 month ago. Patient reports bilateral ankle edema and abdominal distention. Patient states she is using 2 pillows to sleep at night. Patient reports symptoms are worse with ambulation and laying down flat. Patient denies chest pain, fever, cough, chills, abdominal pain, nausea, vomiting, or diarrhea. Patient reports abdominal distention and states she states her weight fluctuates over the past few months. Patient denies a history of heart problems and states she has not been evaluated by cardiology in the past. Patient denies using oxygen at home. Patient reports she quit tobacco use a few years ago, but denies current tobacco, drug, or alcohol use.     HPI  Past Medical History:  Diagnosis Date  . Diabetes mellitus type 2 in nonobese ([place]) 06/06/2015  . Diabetes mellitus without complication ([place])   . Diabetic neuropathy ([place]) 06/06/2015  . Dyslipidemia 06/06/2015  . Encephalopathy   . Essential hypertension 06/06/2015  . Hypertension   . Mood disorder ([place]) 06/06/2015  . Pneumonia 03/11/2017  . Seizure [facility])    sees [name]. abd eeg, on keppra    Patient Active Problem List   Diagnosis Date Noted  . HCAP (healthcare-associated pneumonia) 03/11/2017  . Dyslipidemia 03/11/2017  . Diabetes mellitus with complication ([place])   . Chest pain 12/30/2016  . Seizure disorder ([place]) 12/30/2016  . Diabetic ketoacidosis without coma associated with type 2 diabetes mellitus ([place])   . DKA (diabetic ketoacidoses) ([place]) 11/15/2016  . UTI (urinary tract infection) 11/15/2016  . Acute cystitis without hematuria   . Acute renal  failure ([place])   . Hyperglycemia 11/14/2016  . Diabetes ([place]) 06/05/2016  . Altered mental status   . CKD (chronic kidney disease) stage 3, GFR 30-59 ml/min (HCC) 05/26/2016  . Benign essential HTN 05/26/2016  . Seizures ([place]) 01/08/2016  . Memory loss 10/04/2015  . Acute encephalopathy 06/06/2015  . Diabetic neuropathy ([place]) 06/06/2015    Past Surgical History:  Procedure Laterality Date  . ABDOMINAL HYSTERECTOMY    . EYE SURGERY Bilateral   . TUBAL LIGATION       OB History   No obstetric history on file.      Home Medications    Prior to Admission medications   Medication Sig Start Date End Date Taking? Authorizing Provider  acetaminophen (TYLENOL) 500 MG tablet Take 1,000 mg by mouth every 6 (six) hours as needed for mild pain.   Yes [provider]  amitriptyline (ELAVIL) 50 MG tablet Take 50 mg by mouth at bedtime. 05/08/15  Yes [provider]  amLODipine-olmesartan (AZOR) 5-40 MG tablet Take 1 tablet by mouth daily. 10/08/16  Yes [provider]  aspirin EC 81 MG tablet Take 81 mg by mouth daily.   Yes [provider]  Insulin Glargine (LANTUS SOLOSTAR) 100 UNIT/ML Solostar Pen Inject 26 Units into the skin every morning. 12/13/18  Yes Renato Shin, MD  levETIRAcetam (KEPPRA) 750 MG tablet Take 1 tablet (750 mg total) by mouth 2 (two) times daily. 08/09/18  Yes Dennie Bible, NP  metoprolol succinate (TOPROL-XL) 50 MG 24 hr tablet Take 50 mg by mouth at bedtime.  05/08/15  Yes [provider]  sertraline (ZOLOFT) 50 MG tablet Take 50 mg by mouth daily. 01/07/19  Yes [provider]  carbamide peroxide (DEBROX) 6.5 % OTIC solution Place 5 drops into both ears 2 (two) times daily. Patient not taking: Reported on 12/14/2018 07/17/18   Domenic Moras, PA-C  famotidine (PEPCID) 20 MG tablet Take 1 tablet (20 mg total) by mouth 2 (two) times daily for 5 days. Patient not taking: Reported on 12/14/2018 05/27/18 06/01/18  Joy,  Raquel Sarna C, PA-C  Insulin Pen Needle 31G X 5 MM MISC Used to give daily insulin injections. 07/01/18   Renato Shin, MD  ondansetron (ZOFRAN ODT) 4 MG disintegrating tablet Take 1 tablet (4 mg total) by mouth every 8 (eight) hours as needed for nausea or vomiting. Patient not taking: Reported on 12/14/2018 05/27/18   Joy, Raquel Sarna C, PA-C  pantoprazole (PROTONIX) 20 MG tablet Take 1 tablet (20 mg total) by mouth daily. Patient not taking: Reported on 12/14/2018 05/27/18 07/26/18  Lorayne Bender, PA-C  vitamin E 400 UNIT capsule Take 400 Units by mouth daily.    [provider]    Family History Family History  Problem Relation Age of Onset  . Heart disease Father   . Kidney disease Father   . Diabetes Mother   . Seizures Sister   . Seizures Brother     Social History Social History   Tobacco Use  . Smoking status: Former Smoker    Types: Cigarettes  . Smokeless tobacco: Never Used  Substance Use Topics  . Alcohol use: No  . Drug use: No     Allergies   Percocet [oxycodone-acetaminophen] and Vicodin [hydrocodone-acetaminophen]   Review of Systems Review of Systems  Constitutional: Positive for unexpected weight change. Negative for chills, diaphoresis, fatigue and fever.  HENT: Negative for congestion, rhinorrhea, sore throat and trouble swallowing.   Eyes: Negative for visual disturbance.  Respiratory: Positive for shortness of breath. Negative for cough, chest tightness, wheezing and stridor.   Cardiovascular: Positive for leg swelling. Negative for chest pain and palpitations.  Gastrointestinal: Positive for abdominal distention. Negative for abdominal pain, diarrhea, nausea and vomiting.  Endocrine: Negative for cold intolerance and heat intolerance.  Genitourinary: Negative for difficulty urinating, dysuria and frequency.  Musculoskeletal: Negative for myalgias.  Skin: Negative for pallor and rash.  Allergic/Immunologic: Negative for environmental allergies and food  allergies.  Neurological: Negative for dizziness, syncope, speech difficulty, weakness, light-headedness and numbness.  Psychiatric/Behavioral: The patient is not nervous/anxious.      Physical Exam Updated Vital Signs BP (!) 154/100   Pulse 94   Temp 98.4 F (36.9 C) (Oral)   Resp 18   SpO2 100%   Physical Exam Vitals signs and nursing note reviewed.  Constitutional:      General: She is not in acute distress.    Appearance: She is well-developed. She is not diaphoretic.  HENT:     Head: Normocephalic and atraumatic.     Mouth/Throat:     Mouth: Mucous membranes are moist.     Pharynx: No pharyngeal swelling or oropharyngeal exudate.  Neck:     Musculoskeletal: Normal range of motion and neck supple.     Vascular: No JVD.  Cardiovascular:     Rate and Rhythm: Normal rate and regular rhythm.     Pulses: Normal pulses.          Radial pulses are 2+ on the right side and 2+ on the left side.  Dorsalis pedis pulses are 2+ on the right side and 2+ on the left side.     Heart sounds: Normal heart sounds. No murmur. No friction rub. No gallop.   Pulmonary:     Effort: Pulmonary effort is normal. No respiratory distress.     Breath sounds: Normal breath sounds. No wheezing or rales.  Chest:     Chest wall: No tenderness.  Abdominal:     General: Abdomen is protuberant. There is distension.     Palpations: Abdomen is soft.     Tenderness: There is no abdominal tenderness.  Musculoskeletal: Normal range of motion.     Right lower leg: She exhibits no tenderness. Edema (Mild edema noted on lateral ankles.) present.     Left lower leg: She exhibits no tenderness. Edema (Mild edema noted on lateral ankles bilaterally.) present.  Skin:    General: Skin is warm.     Capillary Refill: Capillary refill takes less than 2 seconds.     Coloration: Skin is not pale.     Findings: No rash.  Neurological:     Mental Status: She is alert and oriented to person, place, and time.     ED Treatments / Results  Labs (all labs ordered are listed, but only abnormal results are displayed) Labs Reviewed  URINALYSIS, ROUTINE W REFLEX MICROSCOPIC - Abnormal; Notable for the following components:      Result Value   APPearance HAZY (*)    Hgb urine dipstick SMALL (*)    Protein, ur 30 (*)    Leukocytes,Ua MODERATE (*)    Bacteria, UA MANY (*)    All other components within normal limits  CBC WITH DIFFERENTIAL/PLATELET - Abnormal; Notable for the following components:   RBC 3.37 (*)    Hemoglobin 9.7 (*)    HCT 30.7 (*)    All other components within normal limits  COMPREHENSIVE METABOLIC PANEL - Abnormal; Notable for the following components:   Glucose, Bld 164 (*)    Creatinine, Ser 1.16 (*)    Alkaline Phosphatase 175 (*)    GFR calc non Af Amer 50 (*)    GFR calc Af Amer 58 (*)    All other components within normal limits  URINE CULTURE  BRAIN NATRIURETIC PEPTIDE  I-STAT TROPONIN, ED   Hemoglobin  Date Value Ref Range Status  01/07/2019 9.7 (L) 12.0 - 15.0 g/dL Final  12/14/2018 10.1 (L) 12.0 - 15.0 g/dL Final  05/26/2018 11.3 (L) 12.0 - 15.0 g/dL Final  01/22/2018 11.2 (L) 12.0 - 15.0 g/dL Final    EKG EKG Interpretation  Date/Time:  Friday January 07 2019 10:01:48 EST Ventricular Rate:  107 PR Interval:    QRS Duration: 93 QT Interval:  339 QTC Calculation: 453 R Axis:   -12 Text Interpretation:  Sinus tachycardia Low voltage, precordial leads No significant change was found Confirmed by Jola Schmidt [phone]) on 01/07/2019 11:14:00 AM Also confirmed by Jola Schmidt [phone]), editor Hattie Perch ([id number])  on 01/07/2019 11:28:17 AM   Radiology Dg Chest 2 View  Result Date: 01/07/2019 CLINICAL DATA:  Dyspnea EXAM: CHEST - 2 VIEW COMPARISON:  05/04/2018 chest radiograph. FINDINGS: Stable cardiomediastinal silhouette with normal heart size. No pneumothorax. No pleural effusion. Lungs appear clear, with no acute consolidative airspace disease  and no pulmonary edema. IMPRESSION: No active cardiopulmonary disease. Electronically Signed   By: Ilona Sorrel M.D.   On: 01/07/2019 10:50    Procedures Procedures (including critical care time)  Medications Ordered in ED  Medications  metoprolol succinate (TOPROL-XL) 24 hr tablet 50 mg (50 mg Oral Given 01/07/19 1228)  amLODipine (NORVASC) tablet 5 mg (5 mg Oral Given 01/07/19 1228)     Initial Impression / Assessment and Plan / ED Course  I have reviewed the triage vital signs and the nursing notes.  Pertinent labs & imaging results that were available during my care of the patient were reviewed by me and considered in my medical decision making (see chart for details).  Clinical Course as of Jan 07 1517  Fri Jan 07, 2019  1104 No active cardiopulmonary disease noted on CXR.   DG Chest 2 View [AH]  1240 Moderate leukocytes, many bacteria, and WBCs present. Will order urine culture.   [place] GuestMarland Kitchen): MODERATE [AH]    Clinical Course User Index [AH] Arville Lime, PA-C      Patient presents with shortness of breath. CXR is negative for active cardiopulmonary disease, BNP is within normal limits, EKG does not reveal acute abnormalities, and troponin is negative. Patient has been asymptomatic while in the ER. Patient is able to ambulate without difficulty. BP elevated throughout today's visit. Provided home medications. BP improved while in the ER. Will advise patient follow up with PCP in 2 days. Advised patient to discuss blood pressure control and abdominal distention/weight management. Provided referral for pulmonology if symptoms persist. Patient is stable and will be discharged to home.   Findings and plan of care discussed with supervising physician Dr. Venora Maples who personally evaluated and examined this patient.   Final Clinical Impressions(s) / ED Diagnoses   Final diagnoses:  Shortness of breath    ED Discharge Orders    None       Julienne Kass 01/07/19 [place], MD 01/08/19 [phone]

## 2019-01-07 NOTE — ED Triage Notes (Signed)
[facility]- pt coming from home with exertional SOB for 1 month. Denies hx of resp disease. Does reports some distention of abdomen and requiring multiple pillows for sleep. VItals stable with EMS, SpO2% 99. No distress noted.

## 2019-01-09 LAB — URINE CULTURE: Culture: >=100000 — AB

## 2019-01-10 ENCOUNTER — Telehealth: Payer: Self-pay | Admitting: Emergency Medicine

## 2019-01-10 NOTE — Telephone Encounter (Signed)
Post ED Visit - Positive Culture Follow-up  Culture report reviewed by antimicrobial stewardship pharmacist: [place] Team []  Elenor Quinones, Pharm.D. []  Heide Guile, Pharm.D., BCPS AQ-ID []  Parks Neptune, Pharm.D., BCPS []  Alycia Rossetti, Pharm.D., BCPS []  [place], Pharm.D., BCPS, AAHIVP []  Legrand Como, Pharm.D., BCPS, AAHIVP []  Salome Arnt, PharmD, BCPS []  Johnnette Gourd, PharmD, BCPS []  Hughes Better, PharmD, BCPS []  Leeroy Cha, PharmD []  Laqueta Linden, PharmD, BCPS []  Albertina Parr, PharmD Elicia Lamp PharmD  [place] Team []  Leodis Sias, PharmD []  [place]ell Spar, PharmD []  Royetta Asal, PharmD []  Graylin Shiver, Rph []  Rema Fendt) Glennon Mac, PharmD []  Arlyn Dunning, PharmD []  Netta Cedars, PharmD []  Dia Sitter, PharmD []  Leone Haven, PharmD []  Gretta Arab, PharmD []  Theodis Shove, PharmD []  Peggyann Juba, PharmD []  Reuel Boom, PharmD   Positive urine culture Treated with none, asymptomatic, no further patient follow-up is required at this time.  Hazle Nordmann 01/10/2019, 10:36 AM

## 2019-01-10 NOTE — Progress Notes (Signed)
ED Antimicrobial Stewardship Positive Culture Follow Up   Tricia Huynh is an 63 y.o. female who presented to [facility] on 01/07/2019 with a chief complaint of  Chief Complaint  Patient presents with  . Shortness of Breath    Recent Results (from the past 720 hour(s))  Urine culture     Status: None   Collection Time: 12/14/18  4:56 PM  Result Value Ref Range Status   Specimen Description   Final    URINE, CLEAN CATCH Performed at [facility], [place] [address] [place], [place] [address]    Special Requests   Final    NONE Performed at [facility], [place] [address], [place], [place] [address]    Culture   Final    NO GROWTH Performed at [facility], [place] [address] [place], [place] [address]    Report Status 12/16/2018 FINAL  Final  Urine Culture     Status: Abnormal   Collection Time: 01/07/19 12:41 PM  Result Value Ref Range Status   Specimen Description URINE, RANDOM  Final   Special Requests   Final    NONE Performed at [facility], [place] [address], [place], [place] [address]    Culture >=100,000 COLONIES/mL KLEBSIELLA PNEUMONIAE (A)  Final   Report Status 01/09/2019 FINAL  Final   Organism ID, Bacteria KLEBSIELLA PNEUMONIAE (A)  Final      Susceptibility   Klebsiella pneumoniae - MIC*    AMPICILLIN >=32 RESISTANT Resistant     CEFAZOLIN <=4 SENSITIVE Sensitive     CEFTRIAXONE <=1 SENSITIVE Sensitive     CIPROFLOXACIN <=0.25 SENSITIVE Sensitive     GENTAMICIN <=1 SENSITIVE Sensitive     IMIPENEM <=0.25 SENSITIVE Sensitive     NITROFURANTOIN 64 INTERMEDIATE Intermediate     TRIMETH/SULFA <=20 SENSITIVE Sensitive     AMPICILLIN/SULBACTAM 4 SENSITIVE Sensitive     PIP/TAZO <=4 SENSITIVE Sensitive     Extended ESBL NEGATIVE Sensitive     * >=100,000 COLONIES/mL KLEBSIELLA PNEUMONIAE    [x]  Patient discharged originally without antimicrobial agent  New antibiotic prescription: No further treatment  treatment recommended at this time for asymptomatic bacteriuria  ED Provider: Pearlie Oyster, PA-C   [name] 01/10/2019, 9:48 AM Clinical Pharmacist Monday - Friday phone -  [phone] Saturday - Sunday phone - [phone]

## 2019-01-13 ENCOUNTER — Other Ambulatory Visit: Payer: Self-pay | Admitting: Neurology

## 2019-01-13 ENCOUNTER — Telehealth: Payer: Self-pay | Admitting: Pulmonary Disease

## 2019-01-13 NOTE — Telephone Encounter (Signed)
ATC patient. Left message for Patient to call back. Per Dr Loanne DrillingLoanne Drilling, Darlis Loan, MD  P [facility] Triage Pool        Please schedule patient for non-urgent new patient visit with me or any Pulmonary provider   JE

## 2019-01-13 NOTE — Telephone Encounter (Signed)
Margaretha Seeds, MD  P [facility] Triage Pool        Please schedule patient for non-urgent new patient visit with me or any Pulmonary provider   JE   ------------------------------------------ [facility] x1 for pt.

## 2019-01-21 ENCOUNTER — Other Ambulatory Visit: Payer: Self-pay | Admitting: Family Medicine

## 2019-01-21 DIAGNOSIS — R198 Other specified symptoms and signs involving the digestive system and abdomen: Secondary | ICD-10-CM

## 2019-01-21 DIAGNOSIS — N390 Urinary tract infection, site not specified: Secondary | ICD-10-CM

## 2019-01-21 NOTE — Telephone Encounter (Signed)
Called patient, unable to reach left message to give us a call back. 

## 2019-01-26 ENCOUNTER — Other Ambulatory Visit: Payer: Self-pay

## 2019-01-28 ENCOUNTER — Other Ambulatory Visit: Payer: Self-pay

## 2019-01-28 ENCOUNTER — Emergency Department (HOSPITAL_COMMUNITY)
Admission: EM | Admit: 2019-01-28 | Discharge: 2019-01-28 | Disposition: A | Discharge status: Home / Self Care | Payer: 59 | Attending: Emergency Medicine | Admitting: Emergency Medicine

## 2019-01-28 ENCOUNTER — Encounter (HOSPITAL_COMMUNITY): Payer: Self-pay | Admitting: Emergency Medicine

## 2019-01-28 ENCOUNTER — Emergency Department (HOSPITAL_COMMUNITY): Payer: 59

## 2019-01-28 DIAGNOSIS — K5900 Constipation, unspecified: Secondary | ICD-10-CM

## 2019-01-28 DIAGNOSIS — Z794 Long term (current) use of insulin: Secondary | ICD-10-CM | POA: Insufficient documentation

## 2019-01-28 DIAGNOSIS — N390 Urinary tract infection, site not specified: Secondary | ICD-10-CM | POA: Insufficient documentation

## 2019-01-28 DIAGNOSIS — N183 Chronic kidney disease, stage 3 (moderate): Secondary | ICD-10-CM | POA: Diagnosis not present

## 2019-01-28 DIAGNOSIS — R0600 Dyspnea, unspecified: Secondary | ICD-10-CM | POA: Diagnosis not present

## 2019-01-28 DIAGNOSIS — Z79899 Other long term (current) drug therapy: Secondary | ICD-10-CM | POA: Diagnosis not present

## 2019-01-28 DIAGNOSIS — R1084 Generalized abdominal pain: Secondary | ICD-10-CM | POA: Diagnosis not present

## 2019-01-28 DIAGNOSIS — Z87891 Personal history of nicotine dependence: Secondary | ICD-10-CM | POA: Insufficient documentation

## 2019-01-28 DIAGNOSIS — R109 Unspecified abdominal pain: Secondary | ICD-10-CM

## 2019-01-28 DIAGNOSIS — E1140 Type 2 diabetes mellitus with diabetic neuropathy, unspecified: Secondary | ICD-10-CM | POA: Diagnosis not present

## 2019-01-28 DIAGNOSIS — Z7982 Long term (current) use of aspirin: Secondary | ICD-10-CM | POA: Diagnosis not present

## 2019-01-28 DIAGNOSIS — I129 Hypertensive chronic kidney disease with stage 1 through stage 4 chronic kidney disease, or unspecified chronic kidney disease: Secondary | ICD-10-CM | POA: Diagnosis not present

## 2019-01-28 LAB — CBC
HCT: 31.6 % — ABNORMAL LOW (ref 36.0–46.0)
Hemoglobin: 9.8 g/dL — ABNORMAL LOW (ref 12.0–15.0)
MCH: 28.3 pg (ref 26.0–34.0)
MCHC: 31 g/dL (ref 30.0–36.0)
MCV: 91.3 fL (ref 80.0–100.0)
Platelets: 307 10*3/uL (ref 150–400)
RBC: 3.46 MIL/uL — ABNORMAL LOW (ref 3.87–5.11)
RDW: 12.6 % (ref 11.5–15.5)
WBC: 5.2 10*3/uL (ref 4.0–10.5)
nRBC: 0 % (ref 0.0–0.2)

## 2019-01-28 LAB — URINALYSIS, ROUTINE W REFLEX MICROSCOPIC
Bilirubin Urine: NEGATIVE
Glucose, UA: NEGATIVE mg/dL
Hgb urine dipstick: NEGATIVE
Ketones, ur: NEGATIVE mg/dL
Nitrite: NEGATIVE
Protein, ur: NEGATIVE mg/dL
Specific Gravity, Urine: 1.017 (ref 1.005–1.030)
pH: 5 (ref 5.0–8.0)

## 2019-01-28 LAB — COMPREHENSIVE METABOLIC PANEL
ALT: 39 U/L (ref 0–44)
AST: 27 U/L (ref 15–41)
Albumin: 3.6 g/dL (ref 3.5–5.0)
Alkaline Phosphatase: 173 U/L — ABNORMAL HIGH (ref 38–126)
Anion gap: 7 (ref 5–15)
BUN: 31 mg/dL — ABNORMAL HIGH (ref 8–23)
CO2: 25 mmol/L (ref 22–32)
Calcium: 9.4 mg/dL (ref 8.9–10.3)
Chloride: 107 mmol/L (ref 98–111)
Creatinine, Ser: 1.65 mg/dL — ABNORMAL HIGH (ref 0.44–1.00)
GFR calc Af Amer: 38 mL/min — ABNORMAL LOW (ref >60)
GFR calc non Af Amer: 33 mL/min — ABNORMAL LOW (ref >60)
Glucose, Bld: 131 mg/dL — ABNORMAL HIGH (ref 70–99)
Potassium: 4.3 mmol/L (ref 3.5–5.1)
Sodium: 139 mmol/L (ref 135–145)
Total Bilirubin: 0.5 mg/dL (ref 0.3–1.2)
Total Protein: 8.4 g/dL — ABNORMAL HIGH (ref 6.5–8.1)

## 2019-01-28 LAB — POC OCCULT BLOOD, ED: Fecal Occult Bld: NEGATIVE

## 2019-01-28 LAB — D-DIMER, QUANTITATIVE: D-Dimer, Quant: 0.51 ug/mL-FEU — ABNORMAL HIGH (ref 0.00–0.50)

## 2019-01-28 LAB — LIPASE, BLOOD: Lipase: 31 U/L (ref 11–51)

## 2019-01-28 MED ORDER — SODIUM CHLORIDE 0.9 % IV BOLUS
500.0000 mL | Freq: Once | INTRAVENOUS | Status: AC
  Administered 2019-01-28: 500 mL via INTRAVENOUS

## 2019-01-28 MED ORDER — SODIUM CHLORIDE 0.9% FLUSH
3.0000 mL | Freq: Once | INTRAVENOUS | Status: AC
  Administered 2019-01-28: 3 mL via INTRAVENOUS

## 2019-01-28 MED ORDER — IOHEXOL 300 MG/ML  SOLN
100.0000 mL | Freq: Once | INTRAMUSCULAR | Status: AC | PRN
  Administered 2019-01-28: 100 mL via INTRAVENOUS

## 2019-01-28 MED ORDER — CEPHALEXIN 500 MG PO CAPS: 500.0000 mg | ORAL_CAPSULE | Freq: Two times a day (BID) | ORAL | 0 refills | Status: AC

## 2019-01-28 NOTE — ED Notes (Signed)
Nurse will draw labs. 

## 2019-01-28 NOTE — ED Provider Notes (Addendum)
[facility] EMERGENCY DEPARTMENT Provider Note   CSN: [id number] Arrival date & time: 01/28/19  1547    History   Chief Complaint Chief Complaint  Patient presents with  . Abdominal Pain    HPI Bianka Liberati is a 63 y.o. female.     HPI   Pt is a 63 y/o female with a h/o diabetes, diabetic neuropathy, hyperlipidemia, hypertension, pneumonia, seizures, who presents to the ED today for evaluation of abd swelling and pain that began in about 2-3 months ago.   Reports generalized abdominal swelling that has been worsening since onset.  She has also had intermittent pain to the bilat aspect of the abdomen. Pain is sharp and lasts for about 5-10 minutes. Pain occurs about 3-4 times per day. Reports nausea, but denies vomiting. She reports constipation and has had to strain to have BM. Last BM today. Denies diarrhea. Had a dark BM today. No bloody stools. No fevers. Has had urinary hesitancy and dysuria, but denies frequency. She states she has had some positional lightheadedness as well . Has had this in the past and it was related to her bp.  She has had some dyspnea with ambulation for at least the last month.  No cp at rest or with exertion.  She states she saw her pcp and is scheduled for [place]  Past Medical History:  Diagnosis Date  . Diabetes mellitus type 2 in nonobese ([place]) 06/06/2015  . Diabetes mellitus without complication ([place])   . Diabetic neuropathy ([place]) 06/06/2015  . Dyslipidemia 06/06/2015  . Encephalopathy   . Essential hypertension 06/06/2015  . Hypertension   . Mood disorder ([place]) 06/06/2015  . Pneumonia 03/11/2017  . Seizure [facility])    sees [name]. abd eeg, on keppra    Patient Active Problem List   Diagnosis Date Noted  . HCAP (healthcare-associated pneumonia) 03/11/2017  . Dyslipidemia 03/11/2017  . Diabetes mellitus with complication ([place])   . Chest pain 12/30/2016  . Seizure disorder ([place]) 12/30/2016  . Diabetic ketoacidosis without  coma associated with type 2 diabetes mellitus ([place])   . DKA (diabetic ketoacidoses) ([place]) 11/15/2016  . UTI (urinary tract infection) 11/15/2016  . Acute cystitis without hematuria   . Acute renal failure ([place])   . Hyperglycemia 11/14/2016  . Diabetes ([place]) 06/05/2016  . Altered mental status   . CKD (chronic kidney disease) stage 3, GFR 30-59 ml/min (HCC) 05/26/2016  . Benign essential HTN 05/26/2016  . Seizures ([place]) 01/08/2016  . Memory loss 10/04/2015  . Acute encephalopathy 06/06/2015  . Diabetic neuropathy ([place]) 06/06/2015    Past Surgical History:  Procedure Laterality Date  . ABDOMINAL HYSTERECTOMY    . EYE SURGERY Bilateral   . TUBAL LIGATION       OB History   No obstetric history on file.      Home Medications    Prior to Admission medications   Medication Sig Start Date End Date Taking? Authorizing Provider  acetaminophen (TYLENOL) 500 MG tablet Take 1,000 mg by mouth every 6 (six) hours as needed for mild pain.   Yes [provider]  amitriptyline (ELAVIL) 50 MG tablet Take 50 mg by mouth at bedtime. 05/08/15  Yes [provider]  amLODipine-olmesartan (AZOR) 5-40 MG tablet Take 1 tablet by mouth daily. 10/08/16  Yes [provider]  aspirin EC 81 MG tablet Take 81 mg by mouth daily.   Yes [provider]  hydrochlorothiazide (HYDRODIURIL) 25 MG tablet Take 25 mg by mouth  daily. 01/13/19  Yes [provider]  Insulin Glargine (LANTUS SOLOSTAR) 100 UNIT/ML Solostar Pen Inject 26 Units into the skin every morning. 12/13/18  Yes Renato Shin, MD  levETIRAcetam (KEPPRA) 750 MG tablet TAKE 1 TABLET(750 MG) BY MOUTH TWICE DAILY Patient taking differently: Take 750 mg by mouth 2 (two) times daily.  01/13/19  Yes Marcial Pacas, MD  metoprolol succinate (TOPROL-XL) 50 MG 24 hr tablet Take 50 mg by mouth at bedtime.  05/08/15  Yes [provider]  sertraline (ZOLOFT) 50 MG tablet Take 50 mg by mouth daily. 01/07/19  Yes  [provider]  vitamin E 400 UNIT capsule Take 400 Units by mouth daily.   Yes [provider]  carbamide peroxide (DEBROX) 6.5 % OTIC solution Place 5 drops into both ears 2 (two) times daily. Patient not taking: Reported on 12/14/2018 07/17/18   Domenic Moras, PA-C  cephALEXin (KEFLEX) 500 MG capsule Take 1 capsule (500 mg total) by mouth 2 (two) times daily for 7 days. 01/28/19 02/04/19  Louanna Vanliew S, PA-C  famotidine (PEPCID) 20 MG tablet Take 1 tablet (20 mg total) by mouth 2 (two) times daily for 5 days. Patient not taking: Reported on 12/14/2018 05/27/18 06/01/18  Joy, Raquel Sarna C, PA-C  Insulin Pen Needle 31G X 5 MM MISC Used to give daily insulin injections. 07/01/18   Renato Shin, MD  ondansetron (ZOFRAN ODT) 4 MG disintegrating tablet Take 1 tablet (4 mg total) by mouth every 8 (eight) hours as needed for nausea or vomiting. Patient not taking: Reported on 12/14/2018 05/27/18   Lorayne Bender, PA-C    Family History Family History  Problem Relation Age of Onset  . Heart disease Father   . Kidney disease Father   . Diabetes Mother   . Seizures Sister   . Seizures Brother     Social History Social History   Tobacco Use  . Smoking status: Former Smoker    Types: Cigarettes  . Smokeless tobacco: Never Used  Substance Use Topics  . Alcohol use: No  . Drug use: No     Allergies   Percocet [oxycodone-acetaminophen] and Vicodin [hydrocodone-acetaminophen]   Review of Systems Review of Systems  Constitutional: Negative for fever.  HENT: Negative for ear pain and sore throat.   Eyes: Negative for visual disturbance.  Respiratory: Positive for shortness of breath (with exertion, chronic). Negative for cough.   Cardiovascular: Negative for chest pain.  Gastrointestinal: Positive for abdominal distention, abdominal pain, constipation and nausea. Negative for diarrhea and vomiting.  Genitourinary: Positive for dysuria and frequency. Negative for hematuria.   Musculoskeletal: Negative for back pain.  Skin: Negative for rash.  Neurological: Positive for light-headedness. Negative for headaches.  All other systems reviewed and are negative.  Physical Exam Updated Vital Signs BP (!) 148/92   Pulse 94   Temp 98.3 F (36.8 C) (Oral)   Resp 16   Ht 5\' 7"  (1.702 m)   Wt 71.2 kg   SpO2 100%   BMI 24.59 kg/m   Physical Exam Vitals signs and nursing note reviewed.  Constitutional:      General: She is not in acute distress.    Appearance: She is well-developed.  HENT:     Head: Normocephalic and atraumatic.  Eyes:     Conjunctiva/sclera: Conjunctivae normal.  Neck:     Musculoskeletal: Neck supple.  Cardiovascular:     Rate and Rhythm: Normal rate and regular rhythm.     Heart sounds: No murmur.  Pulmonary:     Effort: Pulmonary effort is normal. No respiratory distress.     Breath sounds: Normal breath sounds.  Abdominal:     General: Bowel sounds are normal.     Palpations: Abdomen is soft.     Comments: Mild generalized abd TTP, worse to upper abdomen.   Genitourinary:    Comments: Hemoccult obtained by nursing staff while I was present in the room.  No gross blood visualized.  No melena visualized.  Light brown stool noted.  She does have some external hemorrhoids. Skin:    General: Skin is warm and dry.  Neurological:     Mental Status: She is alert.    ED Treatments / Results  Labs (all labs ordered are listed, but only abnormal results are displayed) Labs Reviewed  COMPREHENSIVE METABOLIC PANEL - Abnormal; Notable for the following components:      Result Value   Glucose, Bld 131 (*)    BUN 31 (*)    Creatinine, Ser 1.65 (*)    Total Protein 8.4 (*)    Alkaline Phosphatase 173 (*)    GFR calc non Af Amer 33 (*)    GFR calc Af Amer 38 (*)    All other components within normal limits  CBC - Abnormal; Notable for the following components:   RBC 3.46 (*)    Hemoglobin 9.8 (*)    HCT 31.6 (*)    All other  components within normal limits  URINALYSIS, ROUTINE W REFLEX MICROSCOPIC - Abnormal; Notable for the following components:   APPearance HAZY (*)    Leukocytes,Ua MODERATE (*)    Bacteria, UA RARE (*)    All other components within normal limits  D-DIMER, QUANTITATIVE (NOT AT [facility]) - Abnormal; Notable for the following components:   D-Dimer, Quant 0.51 (*)    All other components within normal limits  URINE CULTURE  LIPASE, BLOOD  POC OCCULT BLOOD, ED    EKG None  Radiology No results found.  Procedures Procedures (including critical care time)  Medications Ordered in ED Medications  sodium chloride flush (NS) 0.9 % injection 3 mL (3 mLs Intravenous Given 01/28/19 1945)  sodium chloride 0.9 % bolus 500 mL (500 mLs Intravenous New Bag/Given 01/28/19 1946)  sodium chloride 0.9 % bolus 500 mL (500 mLs Intravenous New Bag/Given 01/28/19 1946)     Initial Impression / Assessment and Plan / ED Course  I have reviewed the triage vital signs and the nursing notes.  Pertinent labs & imaging results that were available during my care of the patient were reviewed by me and considered in my medical decision making (see chart for details).     Final Clinical Impressions(s) / ED Diagnoses   Final diagnoses:  Lower urinary tract infectious disease  Abdominal pain, unspecified abdominal location  Dyspnea, unspecified type   Pt is a 63 y/o female c/o abd swelling and pain that began in about 2-3 months ago.  Also with lightheadedness and dyspnea with exertion for the last several months.  No chest pain with exertion.  No chest pain at rest.  She has been referred to pulmonology for these symptoms but has not had an appointment yet.  She is slightly tachycardic on arrival.  Also hypertensive.  Afebrile with normal O2 sats and respirations.  Her orthostatics are positive.  On exam she has normal active bowel sounds.  She has generalized abdominal tenderness.  She has no focal tenderness  but she is more tender to the epigastrium  than the remainder of her abdomen.  No significant abdominal distention noted.  No gross blood on rectal exam.  The remainder of her exam is benign.  I ambulated pt around the room for several minutes with pulse ox and her sats remained at 100%. She did not become dyspneic.   CBC without leukocytosis.  Anemia present but at baseline for patient. CMP with elevated BUN and creatinine at 31 and 1.65 respectively.  Normal liver function.  Normal bilirubin.  No elevated anion gap. Lipase normal. UA with moderate leukocytes, 0-5 RBCs, 6-10 WBCs, rare bacteria and 0-5 squamous cells.  Urine culture sent. Will tx with keflex as pt is symptomatic.  ddimer slightly elevated however age adjusted ddimer is negative therefore have low suspicion for pe at this time. Dr. Ralene Bathe aware of plan and in agreement.   Will obtained CT abd/pelvis. This study is pending at time of shift change.  Disposition to be determined based on results.  Care signed out to Dr. Ralene Bathe at shift change with above plan.  ED Discharge Orders         Ordered    cephALEXin (KEFLEX) 500 MG capsule  2 times daily     01/28/19 2012           Bishop Dublin 01/28/19 2109    Bishop Dublin 01/28/19 2113    Quintella Reichert, MD 01/28/19 2322

## 2019-01-28 NOTE — Discharge Instructions (Addendum)
You were given a prescription for antibiotics. Please take the antibiotic prescription fully.   A culture was sent of your urine today to determine if there is any bacterial growth. If the results of the culture are positive and you require an antibiotic or a change of your prescribed antibiotic you will be contacted by the hospital. If the results are negative you will not be contacted.  You will need to follow up with pulmonology in regards to your shortness of breath.   Please follow up with your primary care provider within 5-7 days for re-evaluation of your symptoms.   Please return to the emergency department for any new or worsening symptoms.   Drink plenty of fluids. You can take MiraLAX and Colace, available over-the-counter for your constipation.

## 2019-01-28 NOTE — ED Triage Notes (Signed)
Pt to ED with c/o generalized abd swelling, pain and nausea.  Onset 2 months ago.  Last BM today

## 2019-01-28 NOTE — ED Notes (Signed)
Daughter, Otilio Saber, called. Asked if someone could have her mother call her when she gets back to the room. Her number is [phone]

## 2019-01-30 LAB — URINE CULTURE: Culture: <10000 — AB

## 2019-02-01 ENCOUNTER — Ambulatory Visit
Admission: RE | Admit: 2019-02-01 | Discharge: 2019-02-01 | Disposition: A | Discharge status: Home / Self Care | Payer: 59 | Source: Ambulatory Visit | Attending: Family Medicine | Admitting: Family Medicine

## 2019-02-01 ENCOUNTER — Other Ambulatory Visit: Payer: Self-pay

## 2019-02-01 DIAGNOSIS — R198 Other specified symptoms and signs involving the digestive system and abdomen: Secondary | ICD-10-CM

## 2019-02-01 DIAGNOSIS — N390 Urinary tract infection, site not specified: Secondary | ICD-10-CM

## 2019-02-03 NOTE — Telephone Encounter (Signed)
We have attempted to contact pt several times with no success or call back from pt. Per triage protocol, message will be closed.  

## 2019-03-14 ENCOUNTER — Ambulatory Visit (INDEPENDENT_AMBULATORY_CARE_PROVIDER_SITE_OTHER): Payer: Medicare Other | Admitting: Endocrinology

## 2019-03-14 ENCOUNTER — Encounter: Payer: Self-pay | Admitting: Endocrinology

## 2019-03-14 ENCOUNTER — Ambulatory Visit (INDEPENDENT_AMBULATORY_CARE_PROVIDER_SITE_OTHER): Payer: Medicare Other

## 2019-03-14 ENCOUNTER — Other Ambulatory Visit: Payer: Self-pay

## 2019-03-14 DIAGNOSIS — E118 Type 2 diabetes mellitus with unspecified complications: Secondary | ICD-10-CM | POA: Diagnosis not present

## 2019-03-14 DIAGNOSIS — E1042 Type 1 diabetes mellitus with diabetic polyneuropathy: Secondary | ICD-10-CM | POA: Diagnosis not present

## 2019-03-14 LAB — POCT GLYCOSYLATED HEMOGLOBIN (HGB A1C): Hemoglobin A1C: 6.6 % — AB (ref 4.0–5.6)

## 2019-03-14 MED ORDER — INSULIN GLARGINE 100 UNIT/ML SOLOSTAR PEN: 23.0000 [IU] | PEN_INJECTOR | SUBCUTANEOUS | 3 refills | Status: DC

## 2019-03-14 NOTE — Progress Notes (Signed)
Subjective:    Patient ID: Tricia Huynh, female    DOB: 12-Sep-1956, 63 y.o.   MRN: [id number]  HPI  telehealth visit today via doxy video visit.  Alternatives to telehealth are presented to this patient, and the patient agrees to the telehealth visit. Pt is advised of the cost of the visit, and agrees to this, also.   Patient is at home, and I am at the office.   Pt returns for f/u of diabetes mellitus: DM type: 1 Dx'ed: 0160 Complications: polyneuropathy, DR, and renal insuff.   Therapy: insulin since 2010.   GDM: never DKA: once, in late 2017 Severe hypoglycemia: once, in 2017.   Pancreatitis: never.  Other: She takes QD insulin, due to noncompliance; insurance declined V-GO.   Interval history: She says she never misses the insulin, but she takes just 24 units qd. pt states cbg's vary from 95-200's.  pt states she feels well in general.   Past Medical History:  Diagnosis Date  . Diabetes mellitus type 2 in nonobese ([place]) 06/06/2015  . Diabetes mellitus without complication ([place])   . Diabetic neuropathy ([place]) 06/06/2015  . Dyslipidemia 06/06/2015  . Encephalopathy   . Essential hypertension 06/06/2015  . Hypertension   . Mood disorder ([place]) 06/06/2015  . Pneumonia 03/11/2017  . Seizure [facility])    sees [name]. abd eeg, on keppra    Past Surgical History:  Procedure Laterality Date  . ABDOMINAL HYSTERECTOMY    . EYE SURGERY Bilateral   . TUBAL LIGATION      Social History   Socioeconomic History  . Marital status: Single    Spouse name: Not on file  . Number of children: 3  . Years of education: 10  . Highest education level: Not on file  Occupational History  . Occupation: Disabled  Social Needs  . Financial resource strain: Not on file  . Food insecurity:    Worry: Not on file    Inability: Not on file  . Transportation needs:    Medical: Not on file    Non-medical: Not on file  Tobacco Use  . Smoking status: Former Smoker    Types: Cigarettes  .  Smokeless tobacco: Never Used  Substance and Sexual Activity  . Alcohol use: No  . Drug use: No  . Sexual activity: Yes  Lifestyle  . Physical activity:    Days per week: Not on file    Minutes per session: Not on file  . Stress: Not on file  Relationships  . Social connections:    Talks on phone: Not on file    Gets together: Not on file    Attends religious service: Not on file    Active member of club or organization: Not on file    Attends meetings of clubs or organizations: Not on file    Relationship status: Not on file  . Intimate partner violence:    Fear of current or ex partner: Not on file    Emotionally abused: Not on file    Physically abused: Not on file    Forced sexual activity: Not on file  Other Topics Concern  . Not on file  Social History Narrative   Lives at home with her grandchildren.   Right-handed.   No caffeine use.    Current Outpatient Medications on File Prior to Visit  Medication Sig Dispense Refill  . acetaminophen (TYLENOL) 500 MG tablet Take 1,000 mg by mouth every 6 (six) hours as needed for  mild pain.    Marland Kitchen amitriptyline (ELAVIL) 50 MG tablet Take 50 mg by mouth at bedtime.  1  . amLODipine-olmesartan (AZOR) 5-40 MG tablet Take 1 tablet by mouth daily.    Marland Kitchen aspirin EC 81 MG tablet Take 81 mg by mouth daily.    . carbamide peroxide (DEBROX) 6.5 % OTIC solution Place 5 drops into both ears 2 (two) times daily. 15 mL 0  . hydrochlorothiazide (HYDRODIURIL) 25 MG tablet Take 25 mg by mouth daily.    . Insulin Pen Needle 31G X 5 MM MISC Used to give daily insulin injections. 100 each prn  . levETIRAcetam (KEPPRA) 750 MG tablet TAKE 1 TABLET(750 MG) BY MOUTH TWICE DAILY (Patient taking differently: Take 750 mg by mouth 2 (two) times daily. ) 180 tablet 1  . metoprolol succinate (TOPROL-XL) 50 MG 24 hr tablet Take 50 mg by mouth at bedtime.   1  . ondansetron (ZOFRAN ODT) 4 MG disintegrating tablet Take 1 tablet (4 mg total) by mouth every 8 (eight)  hours as needed for nausea or vomiting. 20 tablet 0  . sertraline (ZOLOFT) 50 MG tablet Take 50 mg by mouth daily.    . vitamin E 400 UNIT capsule Take 400 Units by mouth daily.    . famotidine (PEPCID) 20 MG tablet Take 1 tablet (20 mg total) by mouth 2 (two) times daily for 5 days. (Patient not taking: Reported on 12/14/2018) 10 tablet 0   No current facility-administered medications on file prior to visit.     Allergies  Allergen Reactions  . Percocet [Oxycodone-Acetaminophen] Nausea And Vomiting and Other (See Comments)    Shaky/unsteady.  . Vicodin [Hydrocodone-Acetaminophen] Nausea And Vomiting and Other (See Comments)    Shaky/unsteady.    Family History  Problem Relation Age of Onset  . Heart disease Father   . Kidney disease Father   . Diabetes Mother   . Seizures Sister   . Seizures Brother      Review of Systems She denies hypoglycemia.      Objective:   Physical Exam   Lab Results  Component Value Date   CREATININE 1.65 (H) 01/28/2019   BUN 31 (H) 01/28/2019   NA 139 01/28/2019   K 4.3 01/28/2019   CL 107 01/28/2019   CO2 25 01/28/2019   Lab Results  Component Value Date   HGBA1C 6.6 (A) 03/14/2019      Assessment & Plan:  Type 1 DM, with DR: overcontrolled, given this regimen, which does match insulin to her changing needs throughout the day. Reduce insulin to 23 units qam. Renal failure: in this setting, she is at risk for fasting hypoglycemia.   Reduce insulin to 23 units qam

## 2019-03-14 NOTE — Patient Instructions (Addendum)
check your blood sugar twice a day.  vary the time of day when you check, between before the 3 meals, and at bedtime.  also check if you have symptoms of your blood sugar being too high or too low.  please keep a record of the readings and bring it to your next appointment here (or you can bring the meter itself).  You can write it on any piece of paper.  please call us sooner if your blood sugar goes below 70, or if you have a lot of readings over 200. Please come in to have the A1c checked.  On this type of insulin schedule, you should eat meals on a regular schedule.  If a meal is missed or significantly delayed, your blood sugar could go low.  also, you may need to eat a light snack, to avoid it going low in the early morning.   Please come back for a follow-up appointment in 3 months.

## 2019-03-14 NOTE — Progress Notes (Signed)
Pt presents today for nurse visit for completion of A1C. 

## 2019-05-15 ENCOUNTER — Other Ambulatory Visit: Payer: Self-pay

## 2019-05-15 ENCOUNTER — Emergency Department (HOSPITAL_COMMUNITY)
Admission: EM | Admit: 2019-05-15 | Discharge: 2019-05-15 | Disposition: A | Discharge status: Home / Self Care | Payer: Medicare Other | Attending: Emergency Medicine | Admitting: Emergency Medicine

## 2019-05-15 DIAGNOSIS — Z87891 Personal history of nicotine dependence: Secondary | ICD-10-CM | POA: Diagnosis not present

## 2019-05-15 DIAGNOSIS — R55 Syncope and collapse: Secondary | ICD-10-CM

## 2019-05-15 DIAGNOSIS — F329 Major depressive disorder, single episode, unspecified: Secondary | ICD-10-CM | POA: Insufficient documentation

## 2019-05-15 DIAGNOSIS — Z794 Long term (current) use of insulin: Secondary | ICD-10-CM | POA: Diagnosis not present

## 2019-05-15 DIAGNOSIS — N183 Chronic kidney disease, stage 3 (moderate): Secondary | ICD-10-CM | POA: Insufficient documentation

## 2019-05-15 DIAGNOSIS — E1122 Type 2 diabetes mellitus with diabetic chronic kidney disease: Secondary | ICD-10-CM | POA: Insufficient documentation

## 2019-05-15 DIAGNOSIS — I129 Hypertensive chronic kidney disease with stage 1 through stage 4 chronic kidney disease, or unspecified chronic kidney disease: Secondary | ICD-10-CM | POA: Insufficient documentation

## 2019-05-15 DIAGNOSIS — F32A Depression, unspecified: Secondary | ICD-10-CM

## 2019-05-15 DIAGNOSIS — Z79899 Other long term (current) drug therapy: Secondary | ICD-10-CM | POA: Diagnosis not present

## 2019-05-15 DIAGNOSIS — R5381 Other malaise: Secondary | ICD-10-CM

## 2019-05-15 LAB — CBC WITH DIFFERENTIAL/PLATELET
Abs Immature Granulocytes: 0.01 10*3/uL (ref 0.00–0.07)
Basophils Absolute: 0 10*3/uL (ref 0.0–0.1)
Basophils Relative: 0 %
Eosinophils Absolute: 0 10*3/uL (ref 0.0–0.5)
Eosinophils Relative: 0 %
HCT: 31.7 % — ABNORMAL LOW (ref 36.0–46.0)
Hemoglobin: 9.9 g/dL — ABNORMAL LOW (ref 12.0–15.0)
Immature Granulocytes: 0 %
Lymphocytes Relative: 17 %
Lymphs Abs: 1.1 10*3/uL (ref 0.7–4.0)
MCH: 28.8 pg (ref 26.0–34.0)
MCHC: 31.2 g/dL (ref 30.0–36.0)
MCV: 92.2 fL (ref 80.0–100.0)
Monocytes Absolute: 0.3 10*3/uL (ref 0.1–1.0)
Monocytes Relative: 5 %
Neutro Abs: 4.9 10*3/uL (ref 1.7–7.7)
Neutrophils Relative %: 78 %
Platelets: 271 10*3/uL (ref 150–400)
RBC: 3.44 MIL/uL — ABNORMAL LOW (ref 3.87–5.11)
RDW: 12.5 % (ref 11.5–15.5)
WBC: 6.4 10*3/uL (ref 4.0–10.5)
nRBC: 0 % (ref 0.0–0.2)

## 2019-05-15 LAB — I-STAT CHEM 8, ED
BUN: 19 mg/dL (ref 8–23)
Calcium, Ion: 1.19 mmol/L (ref 1.15–1.40)
Chloride: 107 mmol/L (ref 98–111)
Creatinine, Ser: 1.2 mg/dL — ABNORMAL HIGH (ref 0.44–1.00)
Glucose, Bld: 91 mg/dL (ref 70–99)
HCT: 30 % — ABNORMAL LOW (ref 36.0–46.0)
Hemoglobin: 10.2 g/dL — ABNORMAL LOW (ref 12.0–15.0)
Potassium: 4.1 mmol/L (ref 3.5–5.1)
Sodium: 140 mmol/L (ref 135–145)
TCO2: 23 mmol/L (ref 22–32)

## 2019-05-15 NOTE — Discharge Instructions (Addendum)
The testing today did not show any serious problems.  If you continue to have difficulties with feeling faint, or passing out, return here immediately.  Otherwise, call your doctor in the morning to arrange for follow-up care and treatment.  Ask them about seeing a therapist to receive counseling, to improve your depression and outlook.

## 2019-05-15 NOTE — ED Triage Notes (Signed)
Arrives via EMS from home. C/C weakness and fatigue after a syncopal episode, did not hit head, patient was ambulatory after episode. Patient complains of fatigue and generalized weakness with "jitters."  CBG 92.

## 2019-05-15 NOTE — ED Provider Notes (Signed)
[facility] DEPT Provider Note   CSN: [id number] Arrival date & time: 05/15/19  2036    History   Chief Complaint Chief Complaint  Patient presents with  . Fatigue  . Weakness    HPI Tricia Huynh is a 63 y.o. female.     HPI   She is here for evaluation of syncope with weakness and fatigue.  She can planes of general malaise, sadness, and depression.  She states she is staying to herself a lot, and does not like it when she hears other people arguing.  PCP recently started her on antidepressant medications.  She is not suicidal.  She is not currently seeing a therapist, but has in the past.  Today she was at home and passed out, when she was walking to the door to answer it.  Granddaughter was with her at the time.  Patient feels generally nervous.  He denies fever, chills, cough, dysuria, change in bowel habits, focal weakness or paresthesia.  There is been no chest pain or shortness of breath.  There are no other known modifying factors.  Past Medical History:  Diagnosis Date  . Diabetes mellitus type 2 in nonobese ([place]) 06/06/2015  . Diabetes mellitus without complication ([place])   . Diabetic neuropathy ([place]) 06/06/2015  . Dyslipidemia 06/06/2015  . Encephalopathy   . Essential hypertension 06/06/2015  . Hypertension   . Mood disorder ([place]) 06/06/2015  . Pneumonia 03/11/2017  . Seizure [facility])    sees [name]. abd eeg, on keppra    Patient Active Problem List   Diagnosis Date Noted  . HCAP (healthcare-associated pneumonia) 03/11/2017  . Dyslipidemia 03/11/2017  . Diabetes mellitus with complication ([place])   . Chest pain 12/30/2016  . Seizure disorder ([place]) 12/30/2016  . Diabetic ketoacidosis without coma associated with type 2 diabetes mellitus ([place])   . DKA (diabetic ketoacidoses) ([place]) 11/15/2016  . UTI (urinary tract infection) 11/15/2016  . Acute cystitis without hematuria   . Acute renal failure ([place])   . Hyperglycemia 11/14/2016   . Diabetes ([place]) 06/05/2016  . Altered mental status   . CKD (chronic kidney disease) stage 3, GFR 30-59 ml/min (HCC) 05/26/2016  . Benign essential HTN 05/26/2016  . Seizures ([place]) 01/08/2016  . Memory loss 10/04/2015  . Acute encephalopathy 06/06/2015  . Diabetic neuropathy ([place]) 06/06/2015    Past Surgical History:  Procedure Laterality Date  . ABDOMINAL HYSTERECTOMY    . EYE SURGERY Bilateral   . TUBAL LIGATION       OB History   No obstetric history on file.      Home Medications    Prior to Admission medications   Medication Sig Start Date End Date Taking? Authorizing Provider  acetaminophen (TYLENOL) 500 MG tablet Take 1,000 mg by mouth every 6 (six) hours as needed for mild pain.    [provider]  amitriptyline (ELAVIL) 50 MG tablet Take 50 mg by mouth at bedtime. 05/08/15   [provider]  amLODipine-olmesartan (AZOR) 5-40 MG tablet Take 1 tablet by mouth daily. 10/08/16   [provider]  aspirin EC 81 MG tablet Take 81 mg by mouth daily.    [provider]  carbamide peroxide (DEBROX) 6.5 % OTIC solution Place 5 drops into both ears 2 (two) times daily. 07/17/18   Domenic Moras, PA-C  famotidine (PEPCID) 20 MG tablet Take 1 tablet (20 mg total) by mouth 2 (two) times daily for 5 days. Patient not taking: Reported on 12/14/2018 05/27/18  06/01/18  Joy, Shawn C, PA-C  hydrochlorothiazide (HYDRODIURIL) 25 MG tablet Take 25 mg by mouth daily. 01/13/19   [provider]  Insulin Glargine (LANTUS SOLOSTAR) 100 UNIT/ML Solostar Pen Inject 23 Units into the skin every morning. 03/14/19   Renato Shin, MD  Insulin Pen Needle 31G X 5 MM MISC Used to give daily insulin injections. 07/01/18   Renato Shin, MD  levETIRAcetam (KEPPRA) 750 MG tablet TAKE 1 TABLET(750 MG) BY MOUTH TWICE DAILY Patient taking differently: Take 750 mg by mouth 2 (two) times daily.  01/13/19   Marcial Pacas, MD  metoprolol succinate (TOPROL-XL) 50 MG 24 hr tablet Take  50 mg by mouth at bedtime.  05/08/15   [provider]  ondansetron (ZOFRAN ODT) 4 MG disintegrating tablet Take 1 tablet (4 mg total) by mouth every 8 (eight) hours as needed for nausea or vomiting. 05/27/18   Joy, Shawn C, PA-C  sertraline (ZOLOFT) 50 MG tablet Take 50 mg by mouth daily. 01/07/19   [provider]  vitamin E 400 UNIT capsule Take 400 Units by mouth daily.    [provider]    Family History Family History  Problem Relation Age of Onset  . Heart disease Father   . Kidney disease Father   . Diabetes Mother   . Seizures Sister   . Seizures Brother     Social History Social History   Tobacco Use  . Smoking status: Former Smoker    Types: Cigarettes  . Smokeless tobacco: Never Used  Substance Use Topics  . Alcohol use: No  . Drug use: No     Allergies   Percocet [oxycodone-acetaminophen] and Vicodin [hydrocodone-acetaminophen]   Review of Systems Review of Systems  All other systems reviewed and are negative.    Physical Exam Updated Vital Signs BP (!) 159/102   Pulse (!) 101   Temp 98.2 F (36.8 C) (Oral)   Resp 15   Ht 5\' 7"  (1.702 m)   Wt 70.3 kg   SpO2 99%   BMI 24.28 kg/m   Physical Exam Vitals signs and nursing note reviewed.  Constitutional:      General: She is not in acute distress.    Appearance: She is well-developed. She is not ill-appearing, toxic-appearing or diaphoretic.  HENT:     Head: Normocephalic and atraumatic.     Right Ear: External ear normal.     Left Ear: External ear normal.  Eyes:     Conjunctiva/sclera: Conjunctivae normal.     Pupils: Pupils are equal, round, and reactive to light.  Neck:     Musculoskeletal: Normal range of motion and neck supple.     Trachea: Phonation normal.  Cardiovascular:     Rate and Rhythm: Normal rate and regular rhythm.     Heart sounds: Normal heart sounds.  Pulmonary:     Effort: Pulmonary effort is normal.     Breath sounds: Normal breath sounds.   Abdominal:     General: Bowel sounds are normal. There is no distension.     Palpations: Abdomen is soft.     Tenderness: There is no abdominal tenderness.  Musculoskeletal: Normal range of motion.  Skin:    General: Skin is warm and dry.  Neurological:     Mental Status: She is alert and oriented to person, place, and time.     Cranial Nerves: No cranial nerve deficit.     Sensory: No sensory deficit.     Motor: No abnormal muscle  tone.     Coordination: Coordination normal.  Psychiatric:        Behavior: Behavior normal.        Thought Content: Thought content normal.        Judgment: Judgment normal.     Comments: She appears depressed.      ED Treatments / Results  Labs (all labs ordered are listed, but only abnormal results are displayed) Labs Reviewed  CBC WITH DIFFERENTIAL/PLATELET - Abnormal; Notable for the following components:      Result Value   RBC 3.44 (*)    Hemoglobin 9.9 (*)    HCT 31.7 (*)    All other components within normal limits  I-STAT CHEM 8, ED - Abnormal; Notable for the following components:   Creatinine, Ser 1.20 (*)    Hemoglobin 10.2 (*)    HCT 30.0 (*)    All other components within normal limits    EKG EKG Interpretation  Date/Time:  Sunday May 15 2019 21:06:15 EDT Ventricular Rate:  104 PR Interval:    QRS Duration: 99 QT Interval:  357 QTC Calculation: 470 R Axis:   -30 Text Interpretation:  Sinus rhythm Artifact Left axis deviation Abnormal R-wave progression, early transition since last tracing no significant change Confirmed by Daleen Bo [id number]) on 05/15/2019 10:36:29 PM   Radiology No results found.  Procedures Procedures (including critical care time)  Medications Ordered in ED Medications - No data to display   Initial Impression / Assessment and Plan / ED Course  I have reviewed the triage vital signs and the nursing notes.  Pertinent labs & imaging results that were available during my care of the  patient were reviewed by me and considered in my medical decision making (see chart for details).         Patient Vitals for the past 24 hrs:  BP Temp Temp src Pulse Resp SpO2 Height Weight  05/15/19 2230 (!) 159/102 - - (!) 101 15 99 % - -  05/15/19 2200 (!) 157/98 - - (!) 102 17 100 % - -  05/15/19 2130 (!) 164/103 - - (!) 106 13 100 % - -  05/15/19 2101 (!) 168/114 98.2 F (36.8 C) Oral (!) 106 16 100 % 5\' 7"  (1.702 m) 70.3 kg  05/15/19 2044 - - - - - 100 % - -  05/15/19 2043 (!) 200/120 - - (!) 112 16 100 % - -    11:21 PM Reevaluation with update and discussion. After initial assessment and treatment, an updated evaluation reveals no change in clinical status, findings cussed with the patient all questions asked.Daleen Bo   Medical Decision Making: [place], with an episode of syncope, without evident acute process.  Mild elevation of blood pressure.  Hypertensive urgency, ACS, PE or pneumonia.  Suspect depression is large part of her distress.  She is not psychiatrically unstable.  There is no indication for further ED intervention or hospitalization, at this time.  CRITICAL CARE-no Performed by: Daleen Bo  Nursing Notes Reviewed/ Care Coordinated Applicable Imaging Reviewed Interpretation of Laboratory Data incorporated into ED treatment  The patient appears reasonably screened and/or stabilized for discharge and I doubt any other medical condition or other [facility] requiring further screening, evaluation, or treatment in the ED at this time prior to discharge.  Plan: Home Medications-continue usual; Home Treatments-consider getting therapy, with a counselor.; return here if the recommended treatment, does not improve the symptoms; Recommended follow up-ECP follow-up as soon as possible.  Final Clinical Impressions(s) / ED Diagnoses   Final diagnoses:  Depression, unspecified depression type  Syncope, unspecified syncope type  [facility]    ED Discharge Orders    None        Daleen Bo, MD 05/15/19 2324

## 2019-06-13 ENCOUNTER — Other Ambulatory Visit: Payer: Self-pay

## 2019-06-13 ENCOUNTER — Encounter: Payer: Self-pay | Admitting: Endocrinology

## 2019-06-13 ENCOUNTER — Ambulatory Visit (INDEPENDENT_AMBULATORY_CARE_PROVIDER_SITE_OTHER): Payer: Medicare Other | Admitting: Endocrinology

## 2019-06-13 VITALS — BP 142/90 | HR 100 | Temp 98.2°F | Ht 67.0 in | Wt 152.2 lb

## 2019-06-13 DIAGNOSIS — I10 Essential (primary) hypertension: Secondary | ICD-10-CM | POA: Diagnosis not present

## 2019-06-13 DIAGNOSIS — E1029 Type 1 diabetes mellitus with other diabetic kidney complication: Secondary | ICD-10-CM | POA: Diagnosis not present

## 2019-06-13 DIAGNOSIS — E118 Type 2 diabetes mellitus with unspecified complications: Secondary | ICD-10-CM

## 2019-06-13 LAB — POCT GLYCOSYLATED HEMOGLOBIN (HGB A1C): Hemoglobin A1C: 7.2 % — AB (ref 4.0–5.6)

## 2019-06-13 MED ORDER — LANTUS SOLOSTAR 100 UNIT/ML ~~LOC~~ SOPN: 22.0000 [IU] | PEN_INJECTOR | SUBCUTANEOUS | 3 refills | Status: DC

## 2019-06-13 NOTE — Patient Instructions (Addendum)
Your blood pressure is high today.  Please see your primary care provider soon, to have it rechecked check your blood sugar twice a day.  vary the time of day when you check, between before the 3 meals, and at bedtime.  also check if you have symptoms of your blood sugar being too high or too low.  please keep a record of the readings and bring it to your next appointment here (or you can bring the meter itself).  You can write it on any piece of paper.  please call us sooner if your blood sugar goes below 70, or if you have a lot of readings over 200. Please reduce the lantus to 22 units each morning. In order to prevent the blood sugar from going too low, it is best to eat a light snack at bedtime. On this type of insulin schedule, you should eat meals on a regular schedule.  If a meal is missed or significantly delayed, your blood sugar could go low.  also, you may need to eat a light snack, to avoid it going low in the early morning.   Please come back for a follow-up appointment in 2 months.

## 2019-06-13 NOTE — Progress Notes (Signed)
Subjective:    Patient ID: Tricia Huynh, female    DOB: 10/15/1956, 63 y.o.   MRN: [id number]  HPI Pt returns for f/u of diabetes mellitus: DM type: 1 Dx'ed: 5732 Complications: polyneuropathy, DR, and renal insuff.   Therapy: insulin since 2010.   GDM: never DKA: once, in late 2017 Severe hypoglycemia: once, in 2017.   Pancreatitis: never.  Other: She takes QD insulin, due to noncompliance; insurance declined V-GO.   Interval history: She says she never misses the insulin, but she takes 23 units qd. pt states cbg's vary from 80-200's. However, 2 weeks ago, she had an episode of severe fasting hypoglycemia.  She [place] unable to say why it happened on the particular day.  pt states she otherwise feels well in general.   Past Medical History:  Diagnosis Date  . Diabetes mellitus type 2 in nonobese ([place]) 06/06/2015  . Diabetes mellitus without complication ([place])   . Diabetic neuropathy ([place]) 06/06/2015  . Dyslipidemia 06/06/2015  . Encephalopathy   . Essential hypertension 06/06/2015  . Hypertension   . Mood disorder ([place]) 06/06/2015  . Pneumonia 03/11/2017  . Seizure [facility])    sees [name]. abd eeg, on keppra    Past Surgical History:  Procedure Laterality Date  . ABDOMINAL HYSTERECTOMY    . EYE SURGERY Bilateral   . TUBAL LIGATION      Social History   Socioeconomic History  . Marital status: Single    Spouse name: Not on file  . Number of children: 3  . Years of education: 10  . Highest education level: Not on file  Occupational History  . Occupation: Disabled  Social Needs  . Financial resource strain: Not on file  . Food insecurity    Worry: Not on file    Inability: Not on file  . Transportation needs    Medical: Not on file    Non-medical: Not on file  Tobacco Use  . Smoking status: Former Smoker    Types: Cigarettes  . Smokeless tobacco: Never Used  Substance and Sexual Activity  . Alcohol use: No  . Drug use: No  . Sexual activity: Yes  Lifestyle   . Physical activity    Days per week: Not on file    Minutes per session: Not on file  . Stress: Not on file  Relationships  . Social Herbalist on phone: Not on file    Gets together: Not on file    Attends religious service: Not on file    Active member of club or organization: Not on file    Attends meetings of clubs or organizations: Not on file    Relationship status: Not on file  . Intimate partner violence    Fear of current or ex partner: Not on file    Emotionally abused: Not on file    Physically abused: Not on file    Forced sexual activity: Not on file  Other Topics Concern  . Not on file  Social History Narrative   Lives at home with her grandchildren.   Right-handed.   No caffeine use.    Current Outpatient Medications on File Prior to Visit  Medication Sig Dispense Refill  . acetaminophen (TYLENOL) 500 MG tablet Take 1,000 mg by mouth every 6 (six) hours as needed for mild pain.    Marland Kitchen amitriptyline (ELAVIL) 50 MG tablet Take 50 mg by mouth at bedtime.  1  . amLODipine-olmesartan (AZOR) 5-40 MG tablet Take  1 tablet by mouth daily.    Marland Kitchen aspirin EC 81 MG tablet Take 81 mg by mouth daily.    . carbamide peroxide (DEBROX) 6.5 % OTIC solution Place 5 drops into both ears 2 (two) times daily. 15 mL 0  . hydrochlorothiazide (HYDRODIURIL) 25 MG tablet Take 25 mg by mouth daily.    . Insulin Pen Needle 31G X 5 MM MISC Used to give daily insulin injections. 100 each prn  . levETIRAcetam (KEPPRA) 750 MG tablet TAKE 1 TABLET(750 MG) BY MOUTH TWICE DAILY (Patient taking differently: Take 750 mg by mouth 2 (two) times daily. ) 180 tablet 1  . metoprolol succinate (TOPROL-XL) 50 MG 24 hr tablet Take 50 mg by mouth at bedtime.   1  . ondansetron (ZOFRAN ODT) 4 MG disintegrating tablet Take 1 tablet (4 mg total) by mouth every 8 (eight) hours as needed for nausea or vomiting. 20 tablet 0  . sertraline (ZOLOFT) 50 MG tablet Take 50 mg by mouth daily.    . vitamin E 400  UNIT capsule Take 400 Units by mouth daily.     No current facility-administered medications on file prior to visit.     Allergies  Allergen Reactions  . Percocet [Oxycodone-Acetaminophen] Nausea And Vomiting and Other (See Comments)    Shaky/unsteady.  . Vicodin [Hydrocodone-Acetaminophen] Nausea And Vomiting and Other (See Comments)    Shaky/unsteady.    Family History  Problem Relation Age of Onset  . Heart disease Father   . Kidney disease Father   . Diabetes Mother   . Seizures Sister   . Seizures Brother     BP (!) 142/90 (BP Location: Left Arm, Patient Position: Sitting, Cuff Size: Normal)   Pulse 100   Temp 98.2 F (36.8 C) (Oral)   Ht 5\' 7"  (1.702 m)   Wt 152 lb 3.2 oz (69 kg)   SpO2 97%   BMI 23.84 kg/m    Review of Systems Denies weight change.      Objective:   Physical Exam VITAL SIGNS:  See vs page GENERAL: no distress Pulses: dorsalis pedis intact bilat.   MSK: no deformity of the feet CV: no leg edema Skin:  no ulcer on the feet.  normal color and temp on the feet. Neuro: sensation is intact to touch on the feet  Lab Results  Component Value Date   CREATININE 1.20 (H) 05/15/2019   BUN 19 05/15/2019   NA 140 05/15/2019   K 4.1 05/15/2019   CL 107 05/15/2019   CO2 25 01/28/2019   a1c=7.2%     Assessment & Plan:  HTN: is noted today Type 1 DM, with renal insuff: this is the best control this pt should aim for, given this regimen, which does match insulin to her changing needs throughout the day Hypoglycemia, worse: this limits aggressiveness of glycemic control   Patient Instructions  Your blood pressure is high today.  Please see your primary care provider soon, to have it rechecked check your blood sugar twice a day.  vary the time of day when you check, between before the 3 meals, and at bedtime.  also check if you have symptoms of your blood sugar being too high or too low.  please keep a record of the readings and bring it to your  next appointment here (or you can bring the meter itself).  You can write it on any piece of paper.  please call us sooner if your blood sugar goes below 70,  or if you have a lot of readings over 200. Please reduce the lantus to 22 units each morning. In order to prevent the blood sugar from going too low, it is best to eat a light snack at bedtime. On this type of insulin schedule, you should eat meals on a regular schedule.  If a meal is missed or significantly delayed, your blood sugar could go low.  also, you may need to eat a light snack, to avoid it going low in the early morning.   Please come back for a follow-up appointment in 2 months.

## 2019-07-24 ENCOUNTER — Encounter (HOSPITAL_COMMUNITY): Payer: Self-pay

## 2019-07-24 ENCOUNTER — Emergency Department (HOSPITAL_COMMUNITY)
Admission: EM | Admit: 2019-07-24 | Discharge: 2019-07-24 | Disposition: A | Discharge status: Home / Self Care | Payer: Medicare HMO | Attending: Emergency Medicine | Admitting: Emergency Medicine

## 2019-07-24 ENCOUNTER — Other Ambulatory Visit: Payer: Self-pay

## 2019-07-24 ENCOUNTER — Emergency Department (HOSPITAL_COMMUNITY): Payer: Medicare HMO

## 2019-07-24 DIAGNOSIS — I10 Essential (primary) hypertension: Secondary | ICD-10-CM

## 2019-07-24 DIAGNOSIS — Z87891 Personal history of nicotine dependence: Secondary | ICD-10-CM | POA: Insufficient documentation

## 2019-07-24 DIAGNOSIS — Z79899 Other long term (current) drug therapy: Secondary | ICD-10-CM | POA: Insufficient documentation

## 2019-07-24 DIAGNOSIS — N183 Chronic kidney disease, stage 3 (moderate): Secondary | ICD-10-CM | POA: Insufficient documentation

## 2019-07-24 DIAGNOSIS — Z794 Long term (current) use of insulin: Secondary | ICD-10-CM | POA: Insufficient documentation

## 2019-07-24 DIAGNOSIS — N3001 Acute cystitis with hematuria: Secondary | ICD-10-CM | POA: Diagnosis not present

## 2019-07-24 DIAGNOSIS — E1122 Type 2 diabetes mellitus with diabetic chronic kidney disease: Secondary | ICD-10-CM | POA: Insufficient documentation

## 2019-07-24 DIAGNOSIS — I129 Hypertensive chronic kidney disease with stage 1 through stage 4 chronic kidney disease, or unspecified chronic kidney disease: Secondary | ICD-10-CM | POA: Diagnosis not present

## 2019-07-24 DIAGNOSIS — R109 Unspecified abdominal pain: Secondary | ICD-10-CM | POA: Diagnosis not present

## 2019-07-24 DIAGNOSIS — R1084 Generalized abdominal pain: Secondary | ICD-10-CM | POA: Diagnosis present

## 2019-07-24 LAB — COMPREHENSIVE METABOLIC PANEL
ALT: 14 U/L (ref 0–44)
AST: 17 U/L (ref 15–41)
Albumin: 3.7 g/dL (ref 3.5–5.0)
Alkaline Phosphatase: 129 U/L — ABNORMAL HIGH (ref 38–126)
Anion gap: 9 (ref 5–15)
BUN: 12 mg/dL (ref 8–23)
CO2: 25 mmol/L (ref 22–32)
Calcium: 9.3 mg/dL (ref 8.9–10.3)
Chloride: 105 mmol/L (ref 98–111)
Creatinine, Ser: 1.22 mg/dL — ABNORMAL HIGH (ref 0.44–1.00)
GFR calc Af Amer: 55 mL/min — ABNORMAL LOW (ref >60)
GFR calc non Af Amer: 47 mL/min — ABNORMAL LOW (ref >60)
Glucose, Bld: 205 mg/dL — ABNORMAL HIGH (ref 70–99)
Potassium: 3.5 mmol/L (ref 3.5–5.1)
Sodium: 139 mmol/L (ref 135–145)
Total Bilirubin: 0.7 mg/dL (ref 0.3–1.2)
Total Protein: 7.7 g/dL (ref 6.5–8.1)

## 2019-07-24 LAB — CBC WITH DIFFERENTIAL/PLATELET
Abs Immature Granulocytes: 0.01 10*3/uL (ref 0.00–0.07)
Basophils Absolute: 0 10*3/uL (ref 0.0–0.1)
Basophils Relative: 0 %
Eosinophils Absolute: 0 10*3/uL (ref 0.0–0.5)
Eosinophils Relative: 0 %
HCT: 29.9 % — ABNORMAL LOW (ref 36.0–46.0)
Hemoglobin: 9.6 g/dL — ABNORMAL LOW (ref 12.0–15.0)
Immature Granulocytes: 0 %
Lymphocytes Relative: 30 %
Lymphs Abs: 1 10*3/uL (ref 0.7–4.0)
MCH: 29.4 pg (ref 26.0–34.0)
MCHC: 32.1 g/dL (ref 30.0–36.0)
MCV: 91.7 fL (ref 80.0–100.0)
Monocytes Absolute: 0.3 10*3/uL (ref 0.1–1.0)
Monocytes Relative: 9 %
Neutro Abs: 1.9 10*3/uL (ref 1.7–7.7)
Neutrophils Relative %: 61 %
Platelets: 243 10*3/uL (ref 150–400)
RBC: 3.26 MIL/uL — ABNORMAL LOW (ref 3.87–5.11)
RDW: 13.2 % (ref 11.5–15.5)
WBC: 3.2 10*3/uL — ABNORMAL LOW (ref 4.0–10.5)
nRBC: 0 % (ref 0.0–0.2)

## 2019-07-24 LAB — URINALYSIS, ROUTINE W REFLEX MICROSCOPIC
Bilirubin Urine: NEGATIVE
Glucose, UA: 50 mg/dL — AB
Ketones, ur: NEGATIVE mg/dL
Nitrite: NEGATIVE
Protein, ur: 30 mg/dL — AB
Specific Gravity, Urine: 1.018 (ref 1.005–1.030)
pH: 5 (ref 5.0–8.0)

## 2019-07-24 LAB — LIPASE, BLOOD: Lipase: 31 U/L (ref 11–51)

## 2019-07-24 MED ORDER — HYDROCHLOROTHIAZIDE 25 MG PO TABS
25.0000 mg | ORAL_TABLET | Freq: Every day | ORAL | Status: DC
  Administered 2019-07-24: 25 mg via ORAL
  Filled 2019-07-24 (×2): qty 1

## 2019-07-24 MED ORDER — INSULIN ASPART 100 UNIT/ML ~~LOC~~ SOLN
2.0000 [IU] | Freq: Once | SUBCUTANEOUS | Status: AC
  Administered 2019-07-24: 2 [IU] via INTRAVENOUS

## 2019-07-24 MED ORDER — CEPHALEXIN 500 MG PO CAPS: 500.0000 mg | ORAL_CAPSULE | Freq: Three times a day (TID) | ORAL | 0 refills | Status: AC

## 2019-07-24 MED ORDER — SODIUM CHLORIDE 0.9 % IV SOLN
1.0000 g | Freq: Once | INTRAVENOUS | Status: AC
  Administered 2019-07-24: 1 g via INTRAVENOUS
  Filled 2019-07-24: qty 10

## 2019-07-24 MED ORDER — ONDANSETRON HCL 4 MG/2ML IJ SOLN
4.0000 mg | Freq: Once | INTRAMUSCULAR | Status: AC
  Administered 2019-07-24: 4 mg via INTRAVENOUS
  Filled 2019-07-24: qty 2

## 2019-07-24 MED ORDER — MORPHINE SULFATE (PF) 4 MG/ML IV SOLN
4.0000 mg | Freq: Once | INTRAVENOUS | Status: AC
  Administered 2019-07-24: 4 mg via INTRAVENOUS
  Filled 2019-07-24: qty 1

## 2019-07-24 MED ORDER — SODIUM CHLORIDE 0.9 % IV BOLUS
1000.0000 mL | Freq: Once | INTRAVENOUS | Status: AC
  Administered 2019-07-24: 1000 mL via INTRAVENOUS

## 2019-07-24 NOTE — ED Provider Notes (Signed)
[facility] EMERGENCY DEPARTMENT Provider Note   CSN: [id number] Arrival date & time: 07/24/19  1059     History   Chief Complaint Chief Complaint  Patient presents with   Abdominal Pain    HPI Hillary Struss is a 63 y.o. female with PMH/o DM, HTN, who presents for evaluation of generalized abdominal pain x 1 week that worsened this AM. She reports that over the last week, she has had some generalized abdominal pain that she describes as some "pressure."  She states that it worsened this morning.  She does not take any medications for the pain.  She states that she noticed the pain was worse if she ate.  No other aggravating or alleviating factors.  She had one episode of vomiting last night but states it was nonbloody, nonbilious.  Has not had any other vomiting since.  She reports some decreased appetite secondary to symptoms.  Her last bowel movement was last night and was normal.  No blood noted in stool.  She has not noted any fevers.  She states that she has not noted any dysuria or hematuria.  She denies any recent travel or known COVID-19 exposure.  Denies any vision changes, numbness/weakness of arms or legs, chest pain, difficulty breathing.      The history is provided by the patient.    Past Medical History:  Diagnosis Date   Diabetes mellitus type 2 in nonobese ([place]) 06/06/2015   Diabetes mellitus without complication ([place])    Diabetic neuropathy ([place]) 06/06/2015   Dyslipidemia 06/06/2015   Encephalopathy    Essential hypertension 06/06/2015   Hypertension    Mood disorder ([place]) 06/06/2015   Pneumonia 03/11/2017   Seizure ([place])    sees [name]. abd eeg, on keppra    Patient Active Problem List   Diagnosis Date Noted   HCAP (healthcare-associated pneumonia) 03/11/2017   Dyslipidemia 03/11/2017   Diabetes mellitus with complication ([place])    Chest pain 12/30/2016   Seizure disorder ([place]) 12/30/2016   Diabetic ketoacidosis without  coma associated with type 2 diabetes mellitus ([place])    DKA (diabetic ketoacidoses) ([place]) 11/15/2016   UTI (urinary tract infection) 11/15/2016   Acute cystitis without hematuria    Acute renal failure (HCC)    Hyperglycemia 11/14/2016   Diabetes ([place]) 06/05/2016   Altered mental status    CKD (chronic kidney disease) stage 3, GFR 30-59 ml/min ([place]) 05/26/2016   Benign essential HTN 05/26/2016   Seizures ([place]) 01/08/2016   Memory loss 10/04/2015   Acute encephalopathy 06/06/2015   Diabetic neuropathy ([place]) 06/06/2015    Past Surgical History:  Procedure Laterality Date   ABDOMINAL HYSTERECTOMY     EYE SURGERY Bilateral    TUBAL LIGATION       OB History   No obstetric history on file.      Home Medications    Prior to Admission medications   Medication Sig Start Date End Date Taking? Authorizing Provider  acetaminophen (TYLENOL) 500 MG tablet Take 1,000 mg by mouth every 6 (six) hours as needed for mild pain or headache.    Yes [provider]  amitriptyline (ELAVIL) 50 MG tablet Take 50 mg by mouth at bedtime. 05/08/15  Yes [provider]  amLODipine-olmesartan (AZOR) 5-40 MG tablet Take 1 tablet by mouth daily. 10/08/16  Yes [provider]  aspirin EC 81 MG tablet Take 81 mg by mouth daily.   Yes [provider]  hydrochlorothiazide (HYDRODIURIL) 25 MG tablet Take  25 mg by mouth daily. 01/13/19  Yes [provider]  Insulin Glargine (LANTUS SOLOSTAR) 100 UNIT/ML Solostar Pen Inject 22 Units into the skin every morning. 06/13/19  Yes Renato Shin, MD  levETIRAcetam (KEPPRA) 750 MG tablet TAKE 1 TABLET(750 MG) BY MOUTH TWICE DAILY Patient taking differently: Take 750 mg by mouth 2 (two) times daily.  01/13/19  Yes Marcial Pacas, MD  metoprolol succinate (TOPROL-XL) 50 MG 24 hr tablet Take 50 mg by mouth at bedtime.  05/08/15  Yes [provider]  sertraline (ZOLOFT) 50 MG tablet Take 50 mg by mouth daily.  01/07/19  Yes [provider]  vitamin E 400 UNIT capsule Take 400 Units by mouth daily.   Yes [provider]  carbamide peroxide (DEBROX) 6.5 % OTIC solution Place 5 drops into both ears 2 (two) times daily. Patient not taking: Reported on 07/24/2019 07/17/18   Domenic Moras, PA-C  cephALEXin (KEFLEX) 500 MG capsule Take 1 capsule (500 mg total) by mouth 3 (three) times daily for 7 days. 07/24/19 07/31/19  Volanda Napoleon, PA-C  Insulin Pen Needle 31G X 5 MM MISC Used to give daily insulin injections. 07/01/18   Renato Shin, MD  ondansetron (ZOFRAN ODT) 4 MG disintegrating tablet Take 1 tablet (4 mg total) by mouth every 8 (eight) hours as needed for nausea or vomiting. Patient not taking: Reported on 07/24/2019 05/27/18   Lorayne Bender, PA-C    Family History Family History  Problem Relation Age of Onset   Heart disease Father    Kidney disease Father    Diabetes Mother    Seizures Sister    Seizures Brother     Social History Social History   Tobacco Use   Smoking status: Former Smoker    Types: Cigarettes   Smokeless tobacco: Never Used  Substance Use Topics   Alcohol use: No   Drug use: No     Allergies   Percocet [oxycodone-acetaminophen] and Vicodin [hydrocodone-acetaminophen]   Review of Systems Review of Systems  Constitutional: Positive for appetite change. Negative for fever.  Respiratory: Negative for cough and shortness of breath.   Cardiovascular: Negative for chest pain.  Gastrointestinal: Positive for abdominal pain. Negative for blood in stool, nausea and vomiting.  Genitourinary: Negative for dysuria and hematuria.  Neurological: Negative for headaches.  All other systems reviewed and are negative.    Physical Exam Updated Vital Signs BP (!) 181/107    Pulse 80    Temp 98.5 F (36.9 C) (Oral)    SpO2 100%   Physical Exam Vitals signs and nursing note reviewed.  Constitutional:      Appearance: Normal appearance. She is  well-developed.  HENT:     Head: Normocephalic and atraumatic.     Comments: PERRL. EOMs intact. No nystagmus. No neglect.  Eyes:     General: Lids are normal.     Conjunctiva/sclera: Conjunctivae normal.     Pupils: Pupils are equal, round, and reactive to light.  Neck:     Musculoskeletal: Full passive range of motion without pain.  Cardiovascular:     Rate and Rhythm: Normal rate and regular rhythm.     Pulses: Normal pulses.          Radial pulses are 2+ on the right side and 2+ on the left side.       Dorsalis pedis pulses are 2+ on the right side and 2+ on the left side.     Heart sounds: Normal heart sounds.  No murmur. No friction rub. No gallop.   Pulmonary:     Effort: Pulmonary effort is normal.     Breath sounds: Normal breath sounds.     Comments: Lungs clear to auscultation bilaterally.  Symmetric chest rise.  No wheezing, rales, rhonchi. Abdominal:     Palpations: Abdomen is soft. Abdomen is not rigid.     Tenderness: There is generalized abdominal tenderness. There is no right CVA tenderness, left CVA tenderness or guarding. Negative signs include McBurney's sign.     Comments: Abdomen is soft, nondistended.  Generalized tenderness noted throughout with no focal point.  She has some mild bilateral CVA tenderness.  No McBurney's point tenderness.  Musculoskeletal: Normal range of motion.  Skin:    General: Skin is warm and dry.     Capillary Refill: Capillary refill takes less than 2 seconds.  Neurological:     Mental Status: She is alert and oriented to person, place, and time.     Comments: 5/5 strength to BUE and BLE  Psychiatric:        Speech: Speech normal.      ED Treatments / Results  Labs (all labs ordered are listed, but only abnormal results are displayed) Labs Reviewed  COMPREHENSIVE METABOLIC PANEL - Abnormal; Notable for the following components:      Result Value   Glucose, Bld 205 (*)    Creatinine, Ser 1.22 (*)    Alkaline Phosphatase 129  (*)    GFR calc non Af Amer 47 (*)    GFR calc Af Amer 55 (*)    All other components within normal limits  CBC WITH DIFFERENTIAL/PLATELET - Abnormal; Notable for the following components:   WBC 3.2 (*)    RBC 3.26 (*)    Hemoglobin 9.6 (*)    HCT 29.9 (*)    All other components within normal limits  URINALYSIS, ROUTINE W REFLEX MICROSCOPIC - Abnormal; Notable for the following components:   APPearance HAZY (*)    Glucose, UA 50 (*)    Hgb urine dipstick SMALL (*)    Protein, ur 30 (*)    Leukocytes,Ua MODERATE (*)    Bacteria, UA MANY (*)    All other components within normal limits  URINE CULTURE  LIPASE, BLOOD    EKG None  Radiology Ct Renal Stone Study  Result Date: 07/24/2019 CLINICAL DATA:  Abdominal pain for 6 days EXAM: CT ABDOMEN AND PELVIS WITHOUT CONTRAST TECHNIQUE: Multidetector CT imaging of the abdomen and pelvis was performed following the standard protocol without IV contrast. Sagittal and coronal MPR images reconstructed from axial data set. Oral contrast was not administered. COMPARISON:  01/28/2019 FINDINGS: Lower chest: Minimal bibasilar atelectasis Hepatobiliary: Focal fatty infiltration of liver adjacent to falciform fissure. Gallbladder and liver otherwise normal appearance. Pancreas: Normal appearance Spleen: Normal appearance Adrenals/Urinary Tract: Adrenal glands, kidneys, ureters, and bladder normal appearance Stomach/Bowel: Normal appendix, retrocecal. Sigmoid colon and mid stomach are under distended, with suboptimal assessment of wall thickness. Remaining stomach and bowel loops otherwise unremarkable. Vascular/Lymphatic: Aorta normal caliber with minimal atherosclerotic calcification. No adenopathy. Reproductive: Uterus surgically absent with atrophic ovaries. Other: No free air or free fluid. Small fat containing hernia versus lipoma of the RIGHT inguinal canal Musculoskeletal: No acute osseous abnormalities. Mildly bulging lumbar discs. IMPRESSION: Small  fat containing hernia versus lipoma of the RIGHT inguinal canal. Suboptimal assessment of sigmoid colon and mid stomach as above. No definite acute intra-abdominal or intrapelvic abnormalities otherwise seen. Electronically Signed   By:  Lavonia Dana M.D.   On: 07/24/2019 13:24    Procedures Procedures (including critical care time)  Medications Ordered in ED Medications  hydrochlorothiazide (HYDRODIURIL) tablet 25 mg (25 mg Oral Given 07/24/19 1148)  sodium chloride 0.9 % bolus 1,000 mL (0 mLs Intravenous Stopped 07/24/19 1248)  morphine 4 MG/ML injection 4 mg (4 mg Intravenous Given 07/24/19 1148)  ondansetron (ZOFRAN) injection 4 mg (4 mg Intravenous Given 07/24/19 1148)  cefTRIAXone (ROCEPHIN) 1 g in sodium chloride 0.9 % 100 mL IVPB (0 g Intravenous Stopped 07/24/19 1313)  insulin aspart (novoLOG) injection 2 Units (2 Units Intravenous Given 07/24/19 1557)     Initial Impression / Assessment and Plan / ED Course  I have reviewed the triage vital signs and the nursing notes.  Pertinent labs & imaging results that were available during my care of the patient were reviewed by me and considered in my medical decision making (see chart for details).        63 year old female who presents for evaluation of generalized abdominal pain.  History of chronic abdominal pain and states that this worsened this morning.  One episode of vomiting yesterday.  No fevers, chest pain, difficulty breathing.  Initially arrival, she is afebrile, nontoxic-appearing sitting comfortably on bed.  She is hypertensive.  She does have a history of hypertension and states she did not take her medications this morning.  Denying any chest pain, difficulty breathing, numbness/weakness of arms or legs, vision changes.  She has some diffuse abdominal tenderness with no focal point.  Concern for infectious etiology versus DKA versus chronic abdominal pain.  Plan for labs and reevaluation.   Lipase is unremarkable.  CMP shows glucose  of 205, creatinine of 1.22, alk phos of 129.  Bicarb is 25, anion gap is 9.  Work-up not concerning for DKA.  UA does show hemoglobin, moderate leukocytes, pyuria with no squamous epithelium.  Question UTI versus pyelonephritis.  Given that she does have some abdominal pain and CVA tenderness, will plan for a renal study to evaluate for any presence of infected stone.  CBC shows leukopenia of 3.2 and hemoglobin of 9.6.  Review of records show that is consistent with her previous.  Given UTI, will give dose of Rocephin here in the ED.  CT scan shows small fat-containing hernia versus lipoma of the right inguinal canal.  Suboptimal assessment of sigmoid colon and mid stomach.  No acute intra-abdominal abnormalities.  No evidence of renal stone.   Reevaluation.  Patient is sitting comfortably in bed no signs of acute distress.  She reports pain is completely gone.  On my evaluation, I do not feel any palpable hernia that would be concerning for strangulated versus incarcerated hernia hernia.  Her repeat abdominal exam shows improved tenderness.  Patient would like to try and eat something.  Patient able to tolerate p.o. without any difficulty.  Reevaluation showed improvement in tenderness.  She states her pain is completely gone.  She still hypertensive.  She does have a history of hypertension and did not take any of her medications today.  She was given 1 dose of HCTZ here in the ED.  She currently denies any shortness of breath, chest pain, numbness/weakness of arms or legs, vision changes.  Do not feel is consistent with hypertensive emergency.  Will plan to treat her UTI.  Given that she had some CVA tenderness, will plan to treat as possible pyelonephritis.  At this time, she is afebrile with reassuring vitals, tolerating p.o. without  any difficulty.  Patient stable for patient oral antibiotics.  I discussed this with patient she does agreement. At this time, patient exhibits no emergent life-threatening  condition that require further evaluation in ED or admission. Patient had ample opportunity for questions and discussion. All patient's questions were answered with full understanding. Strict return precautions discussed. Patient expresses understanding and agreement to plan.   Portions of this note were generated with [employer]. Dictation errors may occur despite best attempts at proofreading.   Final Clinical Impressions(s) / ED Diagnoses   Final diagnoses:  Acute cystitis with hematuria  Essential hypertension    ED Discharge Orders         Ordered    cephALEXin (KEFLEX) 500 MG capsule  3 times daily     07/24/19 1655           Desma Mcgregor 07/24/19 2221    Gareth Morgan, MD 07/27/19 0021

## 2019-07-24 NOTE — ED Triage Notes (Signed)
Pt reports lower ab pain x1 week with increased pain today 9/10. N, denies V, D.

## 2019-07-24 NOTE — ED Notes (Signed)
Patient transported to CT 

## 2019-07-24 NOTE — ED Notes (Signed)
Patient verbalizes understanding of discharge instructions. Opportunity for questioning and answers were provided. Armband removed by staff, pt discharged from ED.  

## 2019-07-24 NOTE — Discharge Instructions (Signed)
Take antibiotics as directed. Please take all of your antibiotics until finished.  Make sure you take your blood pressure medications when he got home.  Follow up with your primary care doctor as directed.  Return the emergency department for any worsening pain, vomiting, fevers or any other worsening or concerning symptoms.

## 2019-07-26 LAB — URINE CULTURE: Culture: >=100000 — AB

## 2019-07-27 ENCOUNTER — Telehealth: Payer: Self-pay | Admitting: Emergency Medicine

## 2019-07-27 NOTE — Telephone Encounter (Signed)
Post ED Visit - Positive Culture Follow-up  Culture report reviewed by antimicrobial stewardship pharmacist: [facility] Team []  Elenor Quinones, Pharm.D. []  Heide Guile, Pharm.D., BCPS AQ-ID []  Parks Neptune, Pharm.D., BCPS []  Alycia Rossetti, Pharm.D., BCPS []  [place], Pharm.D., BCPS, AAHIVP []  Legrand Como, Pharm.D., BCPS, AAHIVP []  Salome Arnt, PharmD, BCPS []  Johnnette Gourd, PharmD, BCPS []  Hughes Better, PharmD, BCPS []  Leeroy Cha, PharmD []  Laqueta Linden, PharmD, BCPS []  Albertina Parr, PharmD Agnes Lawrence PharmD  [facility] Team []  Leodis Sias, PharmD []  Lindell Spar, PharmD []  Royetta Asal, PharmD []  Graylin Shiver, Rph []  Rema Fendt) Glennon Mac, PharmD []  Arlyn Dunning, PharmD []  Netta Cedars, PharmD []  Dia Sitter, PharmD []  Leone Haven, PharmD []  Gretta Arab, PharmD []  Theodis Shove, PharmD []  Peggyann Juba, PharmD []  Reuel Boom, PharmD   Positive urine culture Treated with cephalexin, organism sensitive to the same and no further patient follow-up is required at this time.  Hazle Nordmann 07/27/2019, 1:26 PM

## 2019-07-30 ENCOUNTER — Other Ambulatory Visit: Payer: Self-pay

## 2019-07-30 ENCOUNTER — Encounter (HOSPITAL_COMMUNITY): Payer: Self-pay | Admitting: Emergency Medicine

## 2019-07-30 ENCOUNTER — Emergency Department (HOSPITAL_COMMUNITY)
Admission: EM | Admit: 2019-07-30 | Discharge: 2019-07-30 | Disposition: A | Discharge status: Home / Self Care | Payer: Medicare HMO | Attending: Emergency Medicine | Admitting: Emergency Medicine

## 2019-07-30 DIAGNOSIS — Z79899 Other long term (current) drug therapy: Secondary | ICD-10-CM | POA: Insufficient documentation

## 2019-07-30 DIAGNOSIS — E1122 Type 2 diabetes mellitus with diabetic chronic kidney disease: Secondary | ICD-10-CM | POA: Insufficient documentation

## 2019-07-30 DIAGNOSIS — R112 Nausea with vomiting, unspecified: Secondary | ICD-10-CM

## 2019-07-30 DIAGNOSIS — E860 Dehydration: Secondary | ICD-10-CM | POA: Diagnosis not present

## 2019-07-30 DIAGNOSIS — E1140 Type 2 diabetes mellitus with diabetic neuropathy, unspecified: Secondary | ICD-10-CM | POA: Diagnosis not present

## 2019-07-30 DIAGNOSIS — I129 Hypertensive chronic kidney disease with stage 1 through stage 4 chronic kidney disease, or unspecified chronic kidney disease: Secondary | ICD-10-CM | POA: Insufficient documentation

## 2019-07-30 DIAGNOSIS — Z794 Long term (current) use of insulin: Secondary | ICD-10-CM | POA: Insufficient documentation

## 2019-07-30 DIAGNOSIS — N183 Chronic kidney disease, stage 3 (moderate): Secondary | ICD-10-CM | POA: Insufficient documentation

## 2019-07-30 DIAGNOSIS — Z87891 Personal history of nicotine dependence: Secondary | ICD-10-CM | POA: Insufficient documentation

## 2019-07-30 DIAGNOSIS — I10 Essential (primary) hypertension: Secondary | ICD-10-CM | POA: Diagnosis not present

## 2019-07-30 LAB — CBC WITH DIFFERENTIAL/PLATELET
Abs Immature Granulocytes: 0.02 10*3/uL (ref 0.00–0.07)
Basophils Absolute: 0 10*3/uL (ref 0.0–0.1)
Basophils Relative: 1 %
Eosinophils Absolute: 0 10*3/uL (ref 0.0–0.5)
Eosinophils Relative: 0 %
HCT: 30.5 % — ABNORMAL LOW (ref 36.0–46.0)
Hemoglobin: 10 g/dL — ABNORMAL LOW (ref 12.0–15.0)
Immature Granulocytes: 1 %
Lymphocytes Relative: 32 %
Lymphs Abs: 1.4 10*3/uL (ref 0.7–4.0)
MCH: 29.7 pg (ref 26.0–34.0)
MCHC: 32.8 g/dL (ref 30.0–36.0)
MCV: 90.5 fL (ref 80.0–100.0)
Monocytes Absolute: 0.3 10*3/uL (ref 0.1–1.0)
Monocytes Relative: 6 %
Neutro Abs: 2.6 10*3/uL (ref 1.7–7.7)
Neutrophils Relative %: 60 %
Platelets: 275 10*3/uL (ref 150–400)
RBC: 3.37 MIL/uL — ABNORMAL LOW (ref 3.87–5.11)
RDW: 13 % (ref 11.5–15.5)
WBC: 4.4 10*3/uL (ref 4.0–10.5)
nRBC: 0 % (ref 0.0–0.2)

## 2019-07-30 LAB — COMPREHENSIVE METABOLIC PANEL
ALT: 15 U/L (ref 0–44)
AST: 19 U/L (ref 15–41)
Albumin: 3.8 g/dL (ref 3.5–5.0)
Alkaline Phosphatase: 121 U/L (ref 38–126)
Anion gap: 10 (ref 5–15)
BUN: 10 mg/dL (ref 8–23)
CO2: 26 mmol/L (ref 22–32)
Calcium: 9.3 mg/dL (ref 8.9–10.3)
Chloride: 100 mmol/L (ref 98–111)
Creatinine, Ser: 1.1 mg/dL — ABNORMAL HIGH (ref 0.44–1.00)
GFR calc Af Amer: >60 mL/min (ref >60)
GFR calc non Af Amer: 53 mL/min — ABNORMAL LOW (ref >60)
Glucose, Bld: 297 mg/dL — ABNORMAL HIGH (ref 70–99)
Potassium: 3 mmol/L — ABNORMAL LOW (ref 3.5–5.1)
Sodium: 136 mmol/L (ref 135–145)
Total Bilirubin: 0.6 mg/dL (ref 0.3–1.2)
Total Protein: 7.8 g/dL (ref 6.5–8.1)

## 2019-07-30 LAB — LIPASE, BLOOD: Lipase: 28 U/L (ref 11–51)

## 2019-07-30 MED ORDER — MORPHINE SULFATE (PF) 4 MG/ML IV SOLN
4.0000 mg | Freq: Once | INTRAVENOUS | Status: AC
  Administered 2019-07-30: 4 mg via INTRAVENOUS
  Filled 2019-07-30: qty 1

## 2019-07-30 MED ORDER — SODIUM CHLORIDE 0.9 % IV BOLUS
1000.0000 mL | Freq: Once | INTRAVENOUS | Status: AC
  Administered 2019-07-30: 1000 mL via INTRAVENOUS

## 2019-07-30 MED ORDER — HYDRALAZINE HCL 20 MG/ML IJ SOLN
10.0000 mg | Freq: Once | INTRAMUSCULAR | Status: AC
  Administered 2019-07-30: 10 mg via INTRAVENOUS
  Filled 2019-07-30: qty 1

## 2019-07-30 MED ORDER — ONDANSETRON HCL 4 MG PO TABS: 4.0000 mg | ORAL_TABLET | Freq: Four times a day (QID) | ORAL | 0 refills | Status: DC

## 2019-07-30 MED ORDER — ONDANSETRON HCL 4 MG/2ML IJ SOLN
4.0000 mg | Freq: Once | INTRAMUSCULAR | Status: AC
  Administered 2019-07-30: 4 mg via INTRAVENOUS
  Filled 2019-07-30: qty 2

## 2019-07-30 NOTE — ED Provider Notes (Signed)
[facility] EMERGENCY DEPARTMENT Provider Note   CSN: [id number] Arrival date & time: 07/30/19  1143     History   Chief Complaint Chief Complaint  Patient presents with  . Hypertension  . Emesis    HPI Tricia Huynh is a 63 y.o. female.     Pt presents to the ED today with n/v.  Pt was seen here initially on 9/6 and was diagnosed with a uti.  CT renal then was ok.  She said she took the abx and was feeling better.  She started vomiting again last night and has been unable to keep anything down.  She took her bp meds, but threw them up.  Pt denies f/c.  She has upper abd pain.     Past Medical History:  Diagnosis Date  . Diabetes mellitus type 2 in nonobese ([place]) 06/06/2015  . Diabetes mellitus without complication ([place])   . Diabetic neuropathy ([place]) 06/06/2015  . Dyslipidemia 06/06/2015  . Encephalopathy   . Essential hypertension 06/06/2015  . Hypertension   . Mood disorder ([place]) 06/06/2015  . Pneumonia 03/11/2017  . Seizure [facility])    sees [name]. abd eeg, on keppra    Patient Active Problem List   Diagnosis Date Noted  . HCAP (healthcare-associated pneumonia) 03/11/2017  . Dyslipidemia 03/11/2017  . Diabetes mellitus with complication ([place])   . Chest pain 12/30/2016  . Seizure disorder ([place]) 12/30/2016  . Diabetic ketoacidosis without coma associated with type 2 diabetes mellitus ([place])   . DKA (diabetic ketoacidoses) ([place]) 11/15/2016  . UTI (urinary tract infection) 11/15/2016  . Acute cystitis without hematuria   . Acute renal failure ([place])   . Hyperglycemia 11/14/2016  . Diabetes ([place]) 06/05/2016  . Altered mental status   . CKD (chronic kidney disease) stage 3, GFR 30-59 ml/min (HCC) 05/26/2016  . Benign essential HTN 05/26/2016  . Seizures ([place]) 01/08/2016  . Memory loss 10/04/2015  . Acute encephalopathy 06/06/2015  . Diabetic neuropathy ([place]) 06/06/2015    Past Surgical History:  Procedure Laterality Date  . ABDOMINAL  HYSTERECTOMY    . EYE SURGERY Bilateral   . TUBAL LIGATION       OB History   No obstetric history on file.      Home Medications    Prior to Admission medications   Medication Sig Start Date End Date Taking? Authorizing Provider  acetaminophen (TYLENOL) 500 MG tablet Take 1,000 mg by mouth every 6 (six) hours as needed for mild pain or headache.     [provider]  amitriptyline (ELAVIL) 50 MG tablet Take 50 mg by mouth at bedtime. 05/08/15   [provider]  amLODipine-olmesartan (AZOR) 5-40 MG tablet Take 1 tablet by mouth daily. 10/08/16   [provider]  aspirin EC 81 MG tablet Take 81 mg by mouth daily.    [provider]  carbamide peroxide (DEBROX) 6.5 % OTIC solution Place 5 drops into both ears 2 (two) times daily. Patient not taking: Reported on 07/24/2019 07/17/18   Domenic Moras, PA-C  cephALEXin (KEFLEX) 500 MG capsule Take 1 capsule (500 mg total) by mouth 3 (three) times daily for 7 days. 07/24/19 07/31/19  Volanda Napoleon, PA-C  hydrochlorothiazide (HYDRODIURIL) 25 MG tablet Take 25 mg by mouth daily. 01/13/19   [provider]  Insulin Glargine (LANTUS SOLOSTAR) 100 UNIT/ML Solostar Pen Inject 22 Units into the skin every morning. 06/13/19   Renato Shin, MD  Insulin Pen Needle 31G X 5 MM  MISC Used to give daily insulin injections. 07/01/18   Renato Shin, MD  levETIRAcetam (KEPPRA) 750 MG tablet TAKE 1 TABLET(750 MG) BY MOUTH TWICE DAILY Patient taking differently: Take 750 mg by mouth 2 (two) times daily.  01/13/19   Marcial Pacas, MD  metoprolol succinate (TOPROL-XL) 50 MG 24 hr tablet Take 50 mg by mouth at bedtime.  05/08/15   [provider]  ondansetron (ZOFRAN ODT) 4 MG disintegrating tablet Take 1 tablet (4 mg total) by mouth every 8 (eight) hours as needed for nausea or vomiting. Patient not taking: Reported on 07/24/2019 05/27/18   Arlean Hopping C, PA-C  sertraline (ZOLOFT) 50 MG tablet Take 50 mg by mouth daily. 01/07/19    [provider]  vitamin E 400 UNIT capsule Take 400 Units by mouth daily.    [provider]    Family History Family History  Problem Relation Age of Onset  . Heart disease Father   . Kidney disease Father   . Diabetes Mother   . Seizures Sister   . Seizures Brother     Social History Social History   Tobacco Use  . Smoking status: Former Smoker    Types: Cigarettes  . Smokeless tobacco: Never Used  Substance Use Topics  . Alcohol use: No  . Drug use: No     Allergies   Percocet [oxycodone-acetaminophen] and Vicodin [hydrocodone-acetaminophen]   Review of Systems Review of Systems  Gastrointestinal: Positive for abdominal pain, nausea and vomiting.  All other systems reviewed and are negative.    Physical Exam Updated Vital Signs BP (!) 163/97   Pulse 91   Temp 98.5 F (36.9 C) (Oral)   Resp 16   SpO2 97%   Physical Exam Vitals signs and nursing note reviewed.  Constitutional:      Appearance: Normal appearance.  HENT:     Head: Normocephalic and atraumatic.     Right Ear: External ear normal.     Left Ear: External ear normal.     Nose: Nose normal.     Mouth/Throat:     Mouth: Mucous membranes are dry.  Eyes:     Extraocular Movements: Extraocular movements intact.     Conjunctiva/sclera: Conjunctivae normal.     Pupils: Pupils are equal, round, and reactive to light.  Neck:     Musculoskeletal: Normal range of motion and neck supple.  Cardiovascular:     Rate and Rhythm: Regular rhythm. Tachycardia present.     Pulses: Normal pulses.     Heart sounds: Normal heart sounds.  Pulmonary:     Effort: Pulmonary effort is normal.     Breath sounds: Normal breath sounds.  Abdominal:     General: Abdomen is flat.     Tenderness: There is abdominal tenderness in the epigastric area.  Musculoskeletal: Normal range of motion.  Skin:    General: Skin is warm.     Capillary Refill: Capillary refill takes less than 2 seconds.   Neurological:     General: No focal deficit present.     Mental Status: She is alert and oriented to person, place, and time.  Psychiatric:        Mood and Affect: Mood normal.        Behavior: Behavior normal.      ED Treatments / Results  Labs (all labs ordered are listed, but only abnormal results are displayed) Labs Reviewed  CBC WITH DIFFERENTIAL/PLATELET - Abnormal; Notable for the following components:  Result Value   RBC 3.37 (*)    Hemoglobin 10.0 (*)    HCT 30.5 (*)    All other components within normal limits  COMPREHENSIVE METABOLIC PANEL  LIPASE, BLOOD  URINALYSIS, ROUTINE W REFLEX MICROSCOPIC    EKG None  Radiology No results found.  Procedures Procedures (including critical care time)  Medications Ordered in ED Medications  morphine 4 MG/ML injection 4 mg (4 mg Intravenous Given 07/30/19 1444)  ondansetron (ZOFRAN) injection 4 mg (4 mg Intravenous Given 07/30/19 1433)  sodium chloride 0.9 % bolus 1,000 mL (1,000 mLs Intravenous New Bag/Given 07/30/19 1440)  hydrALAZINE (APRESOLINE) injection 10 mg (10 mg Intravenous Given 07/30/19 1440)     Initial Impression / Assessment and Plan / ED Course  I have reviewed the triage vital signs and the nursing notes.  Pertinent labs & imaging results that were available during my care of the patient were reviewed by me and considered in my medical decision making (see chart for details).       Labs pending at shift change.  Pt signed out to Dr. Wilson Singer.  Final Clinical Impressions(s) / ED Diagnoses   Final diagnoses:  Dehydration  Non-intractable vomiting with nausea, unspecified vomiting type    ED Discharge Orders    None       Isla Pence, MD 07/30/19 1528

## 2019-07-30 NOTE — ED Notes (Signed)
Pt.  was oriented and alert. Pt. verbalized discharge instructions back and was discharged home

## 2019-07-30 NOTE — ED Triage Notes (Signed)
Patient c/o hypertension and emesis onset of yesterday. Patient states she took her BP medication but unsure if it stayed down. Reports finished course of antibiotics recently.

## 2019-07-30 NOTE — ED Notes (Signed)
Pt. Alert and oriented, verbalized discharge instructions back

## 2019-07-30 NOTE — ED Provider Notes (Signed)
Assumed care at change of shift. Pt feeling better. BP a little improved. Today's and recent work-ups reviewed. I doubt emergent condition. Plan PRN Zofran. Return precautions discussed.    Virgel Manifold, MD 07/30/19 1724

## 2019-07-30 NOTE — ED Notes (Signed)
Pt. Complains of pain in abdomen around 7. Has not been able to keep anything down to include BP medication. Pt. States not having a bowel movement for the last week

## 2019-08-03 ENCOUNTER — Other Ambulatory Visit: Payer: Self-pay

## 2019-08-03 DIAGNOSIS — E1042 Type 1 diabetes mellitus with diabetic polyneuropathy: Secondary | ICD-10-CM

## 2019-08-03 MED ORDER — INSULIN PEN NEEDLE 31G X 5 MM MISC: 1.0000 | Freq: Every day | 99 refills | Status: DC

## 2019-08-04 ENCOUNTER — Other Ambulatory Visit: Payer: Self-pay | Admitting: Neurology

## 2019-08-15 ENCOUNTER — Other Ambulatory Visit: Payer: Self-pay

## 2019-08-15 ENCOUNTER — Encounter: Payer: Self-pay | Admitting: Endocrinology

## 2019-08-15 ENCOUNTER — Ambulatory Visit (INDEPENDENT_AMBULATORY_CARE_PROVIDER_SITE_OTHER): Payer: Medicare HMO | Admitting: Endocrinology

## 2019-08-15 VITALS — BP 150/82 | HR 103 | Ht 67.0 in | Wt 141.2 lb

## 2019-08-15 DIAGNOSIS — E1042 Type 1 diabetes mellitus with diabetic polyneuropathy: Secondary | ICD-10-CM | POA: Diagnosis not present

## 2019-08-15 DIAGNOSIS — Z23 Encounter for immunization: Secondary | ICD-10-CM | POA: Diagnosis not present

## 2019-08-15 LAB — POCT GLYCOSYLATED HEMOGLOBIN (HGB A1C): Hemoglobin A1C: 7 % — AB (ref 4.0–5.6)

## 2019-08-15 MED ORDER — LANTUS SOLOSTAR 100 UNIT/ML ~~LOC~~ SOPN: 21.0000 [IU] | PEN_INJECTOR | SUBCUTANEOUS | 3 refills | Status: DC

## 2019-08-15 NOTE — Progress Notes (Signed)
Subjective:    Patient ID: Tricia Huynh, female    DOB: 1956/08/08, 63 y.o.   MRN: [id number]  HPI Pt returns for f/u of diabetes mellitus: DM type: 1 Dx'ed: 3016 Complications: polyneuropathy, DR, and renal insuff.   Therapy: insulin since 2010.   GDM: never DKA: once, in late 2017 Severe hypoglycemia: last episode was 2020.   Pancreatitis: never.  Other: She takes QD insulin, due to noncompliance; insurance declined V-GO.   Interval history: she brings her meter with her cbg's which I have reviewed today.  cbg varies from 63-270.  It is in general higher as the day goes on.  pt states she feels well in general. Past Medical History:  Diagnosis Date   Diabetes mellitus type 2 in nonobese ([place]) 06/06/2015   Diabetes mellitus without complication ([place])    Diabetic neuropathy ([place]) 06/06/2015   Dyslipidemia 06/06/2015   Encephalopathy    Essential hypertension 06/06/2015   Hypertension    Mood disorder ([place]) 06/06/2015   Pneumonia 03/11/2017   Seizure ([place])    sees [name]. abd eeg, on keppra    Past Surgical History:  Procedure Laterality Date   ABDOMINAL HYSTERECTOMY     EYE SURGERY Bilateral    TUBAL LIGATION      Social History   Socioeconomic History   Marital status: Single    Spouse name: Not on file   Number of children: 3   Years of education: 10   Highest education level: Not on file  Occupational History   Occupation: Disabled  Scientist, product/process development strain: Not on file   Food insecurity    Worry: Not on file    Inability: Not on file   Transportation needs    Medical: Not on file    Non-medical: Not on file  Tobacco Use   Smoking status: Former Smoker    Types: Cigarettes   Smokeless tobacco: Never Used  Substance and Sexual Activity   Alcohol use: No   Drug use: No   Sexual activity: Yes  Lifestyle   Physical activity    Days per week: Not on file    Minutes per session: Not on file   Stress: Not  on file  Relationships   Social connections    Talks on phone: Not on file    Gets together: Not on file    Attends religious service: Not on file    Active member of club or organization: Not on file    Attends meetings of clubs or organizations: Not on file    Relationship status: Not on file   Intimate partner violence    Fear of current or ex partner: Not on file    Emotionally abused: Not on file    Physically abused: Not on file    Forced sexual activity: Not on file  Other Topics Concern   Not on file  Social History Narrative   Lives at home with her grandchildren.   Right-handed.   No caffeine use.    Current Outpatient Medications on File Prior to Visit  Medication Sig Dispense Refill   acetaminophen (TYLENOL) 500 MG tablet Take 1,000 mg by mouth every 6 (six) hours as needed for mild pain or headache.      amitriptyline (ELAVIL) 50 MG tablet Take 50 mg by mouth at bedtime.  1   amLODipine-olmesartan (AZOR) 5-40 MG tablet Take 1 tablet by mouth daily.     aspirin EC 81 MG tablet Take  81 mg by mouth daily.     hydrochlorothiazide (HYDRODIURIL) 25 MG tablet Take 25 mg by mouth daily.     Insulin Pen Needle 31G X 5 MM MISC 1 each by Other route daily. Used to give daily insulin injections. 100 each prn   levETIRAcetam (KEPPRA) 750 MG tablet Take one tablet by mouth twice daily.  Please call [phone] to schedule yearly follow up appt. 180 tablet 1   metoprolol succinate (TOPROL-XL) 50 MG 24 hr tablet Take 50 mg by mouth at bedtime.   1   sertraline (ZOLOFT) 50 MG tablet Take 50 mg by mouth daily.     vitamin E 400 UNIT capsule Take 400 Units by mouth daily.     No current facility-administered medications on file prior to visit.     Allergies  Allergen Reactions   Percocet [Oxycodone-Acetaminophen] Nausea And Vomiting and Other (See Comments)    Causes the patient to be shaky/unsteady, also   Vicodin [Hydrocodone-Acetaminophen] Nausea And Vomiting and  Other (See Comments)    Causes the patient to be shaky/unsteady, also    Family History  Problem Relation Age of Onset   Heart disease Father    Kidney disease Father    Diabetes Mother    Seizures Sister    Seizures Brother     BP (!) 150/82 (BP Location: Right Arm, Patient Position: Sitting, Cuff Size: Normal)    Pulse (!) 103    Ht 5\' 7"  (1.702 m)    Wt 141 lb 3.2 oz (64 kg)    SpO2 98%    BMI 22.12 kg/m    Review of Systems Denies LOC    Objective:   Physical Exam VITAL SIGNS:  See vs page GENERAL: no distress Pulses: dorsalis pedis intact bilat.   MSK: no deformity of the feet CV: no leg edema Skin:  no ulcer on the feet.  normal color and temp on the feet. Neuro: sensation is intact to touch on the feet   Lab Results  Component Value Date   CREATININE 1.10 (H) 07/30/2019   BUN 10 07/30/2019   NA 136 07/30/2019   K 3.0 (L) 07/30/2019   CL 100 07/30/2019   CO2 26 07/30/2019    Lab Results  Component Value Date   HGBA1C 7.0 (A) 08/15/2019       Assessment & Plan:  HTN: is noted today Type 1 DM, with DR: overcontrolled, given this insulin regimen, which does match insulin to her changing needs throughout the day Hypoglycemia: this limits aggressiveness of glycemic control   Patient Instructions  Your blood pressure is high today.  Please see your primary care provider soon, to have it rechecked Please reduce the Lantus to 21 units each morning.   check your blood sugar 4 times a day: before the 3 meals, and at bedtime.  also check if you have symptoms of your blood sugar being too high or too low.  please keep a record of the readings and bring it to your next appointment here (or you can bring the meter itself).  You can write it on any piece of paper.  please call us sooner if your blood sugar goes below 70, or if you have a lot of readings over 200.   Please come back for a follow-up appointment in 3-4 months.

## 2019-08-15 NOTE — Patient Instructions (Addendum)
Your blood pressure is high today.  Please see your primary care provider soon, to have it rechecked Please reduce the Lantus to 21 units each morning.   check your blood sugar 4 times a day: before the 3 meals, and at bedtime.  also check if you have symptoms of your blood sugar being too high or too low.  please keep a record of the readings and bring it to your next appointment here (or you can bring the meter itself).  You can write it on any piece of paper.  please call us sooner if your blood sugar goes below 70, or if you have a lot of readings over 200.   Please come back for a follow-up appointment in 3-4 months.

## 2019-08-29 ENCOUNTER — Other Ambulatory Visit: Payer: Self-pay | Admitting: Family Medicine

## 2019-08-29 DIAGNOSIS — Z1231 Encounter for screening mammogram for malignant neoplasm of breast: Secondary | ICD-10-CM

## 2019-09-06 ENCOUNTER — Other Ambulatory Visit: Payer: Self-pay

## 2019-09-06 DIAGNOSIS — E1042 Type 1 diabetes mellitus with diabetic polyneuropathy: Secondary | ICD-10-CM

## 2019-09-06 MED ORDER — LANTUS SOLOSTAR 100 UNIT/ML ~~LOC~~ SOPN: 21.0000 [IU] | PEN_INJECTOR | SUBCUTANEOUS | 3 refills | Status: DC

## 2019-09-08 DIAGNOSIS — I10 Essential (primary) hypertension: Secondary | ICD-10-CM | POA: Diagnosis not present

## 2019-09-08 DIAGNOSIS — M5126 Other intervertebral disc displacement, lumbar region: Secondary | ICD-10-CM | POA: Diagnosis not present

## 2019-09-08 DIAGNOSIS — D649 Anemia, unspecified: Secondary | ICD-10-CM | POA: Diagnosis not present

## 2019-09-08 DIAGNOSIS — Z23 Encounter for immunization: Secondary | ICD-10-CM | POA: Diagnosis not present

## 2019-09-08 DIAGNOSIS — R1012 Left upper quadrant pain: Secondary | ICD-10-CM | POA: Diagnosis not present

## 2019-09-08 DIAGNOSIS — E876 Hypokalemia: Secondary | ICD-10-CM | POA: Diagnosis not present

## 2019-10-12 ENCOUNTER — Ambulatory Visit: Payer: Medicare HMO

## 2019-11-02 ENCOUNTER — Other Ambulatory Visit: Payer: Self-pay

## 2019-11-02 ENCOUNTER — Emergency Department (HOSPITAL_COMMUNITY): Payer: Medicare HMO

## 2019-11-02 ENCOUNTER — Emergency Department (HOSPITAL_COMMUNITY)
Admission: EM | Admit: 2019-11-02 | Discharge: 2019-11-02 | Disposition: A | Discharge status: Home / Self Care | Payer: Medicare HMO | Attending: Emergency Medicine | Admitting: Emergency Medicine

## 2019-11-02 ENCOUNTER — Encounter (HOSPITAL_COMMUNITY): Payer: Self-pay | Admitting: Emergency Medicine

## 2019-11-02 DIAGNOSIS — R630 Anorexia: Secondary | ICD-10-CM | POA: Insufficient documentation

## 2019-11-02 DIAGNOSIS — Z794 Long term (current) use of insulin: Secondary | ICD-10-CM | POA: Insufficient documentation

## 2019-11-02 DIAGNOSIS — Z79899 Other long term (current) drug therapy: Secondary | ICD-10-CM | POA: Insufficient documentation

## 2019-11-02 DIAGNOSIS — Z7982 Long term (current) use of aspirin: Secondary | ICD-10-CM | POA: Insufficient documentation

## 2019-11-02 DIAGNOSIS — R1084 Generalized abdominal pain: Secondary | ICD-10-CM | POA: Diagnosis present

## 2019-11-02 DIAGNOSIS — E1122 Type 2 diabetes mellitus with diabetic chronic kidney disease: Secondary | ICD-10-CM | POA: Diagnosis not present

## 2019-11-02 DIAGNOSIS — Z20828 Contact with and (suspected) exposure to other viral communicable diseases: Secondary | ICD-10-CM | POA: Insufficient documentation

## 2019-11-02 DIAGNOSIS — N3000 Acute cystitis without hematuria: Secondary | ICD-10-CM | POA: Diagnosis not present

## 2019-11-02 DIAGNOSIS — R110 Nausea: Secondary | ICD-10-CM | POA: Diagnosis not present

## 2019-11-02 DIAGNOSIS — N281 Cyst of kidney, acquired: Secondary | ICD-10-CM | POA: Insufficient documentation

## 2019-11-02 DIAGNOSIS — Z87891 Personal history of nicotine dependence: Secondary | ICD-10-CM | POA: Diagnosis not present

## 2019-11-02 DIAGNOSIS — I129 Hypertensive chronic kidney disease with stage 1 through stage 4 chronic kidney disease, or unspecified chronic kidney disease: Secondary | ICD-10-CM | POA: Diagnosis not present

## 2019-11-02 DIAGNOSIS — R000 Tachycardia, unspecified: Secondary | ICD-10-CM | POA: Diagnosis not present

## 2019-11-02 DIAGNOSIS — R1110 Vomiting, unspecified: Secondary | ICD-10-CM | POA: Diagnosis not present

## 2019-11-02 DIAGNOSIS — R350 Frequency of micturition: Secondary | ICD-10-CM | POA: Diagnosis not present

## 2019-11-02 DIAGNOSIS — R112 Nausea with vomiting, unspecified: Secondary | ICD-10-CM | POA: Diagnosis not present

## 2019-11-02 DIAGNOSIS — N183 Chronic kidney disease, stage 3 unspecified: Secondary | ICD-10-CM | POA: Diagnosis not present

## 2019-11-02 LAB — POCT I-STAT EG7
Acid-base deficit: 4 mmol/L — ABNORMAL HIGH (ref 0.0–2.0)
Bicarbonate: 22.4 mmol/L (ref 20.0–28.0)
Calcium, Ion: 1.21 mmol/L (ref 1.15–1.40)
HCT: 27 % — ABNORMAL LOW (ref 36.0–46.0)
Hemoglobin: 9.2 g/dL — ABNORMAL LOW (ref 12.0–15.0)
O2 Saturation: 67 %
Potassium: 3.8 mmol/L (ref 3.5–5.1)
Sodium: 136 mmol/L (ref 135–145)
TCO2: 24 mmol/L (ref 22–32)
pCO2, Ven: 47.2 mmHg (ref 44.0–60.0)
pH, Ven: 7.285 (ref 7.250–7.430)
pO2, Ven: 39 mmHg (ref 32.0–45.0)

## 2019-11-02 LAB — COMPREHENSIVE METABOLIC PANEL
ALT: 14 U/L (ref 0–44)
AST: 16 U/L (ref 15–41)
Albumin: 4 g/dL (ref 3.5–5.0)
Alkaline Phosphatase: 163 U/L — ABNORMAL HIGH (ref 38–126)
Anion gap: 15 (ref 5–15)
BUN: 18 mg/dL (ref 8–23)
CO2: 19 mmol/L — ABNORMAL LOW (ref 22–32)
Calcium: 9.6 mg/dL (ref 8.9–10.3)
Chloride: 96 mmol/L — ABNORMAL LOW (ref 98–111)
Creatinine, Ser: 1.48 mg/dL — ABNORMAL HIGH (ref 0.44–1.00)
GFR calc Af Amer: 43 mL/min — ABNORMAL LOW (ref >60)
GFR calc non Af Amer: 37 mL/min — ABNORMAL LOW (ref >60)
Glucose, Bld: 443 mg/dL — ABNORMAL HIGH (ref 70–99)
Potassium: 4 mmol/L (ref 3.5–5.1)
Sodium: 130 mmol/L — ABNORMAL LOW (ref 135–145)
Total Bilirubin: 0.8 mg/dL (ref 0.3–1.2)
Total Protein: 8.1 g/dL (ref 6.5–8.1)

## 2019-11-02 LAB — URINALYSIS, ROUTINE W REFLEX MICROSCOPIC
Bilirubin Urine: NEGATIVE
Glucose, UA: >=500 mg/dL — AB
Ketones, ur: 20 mg/dL — AB
Nitrite: POSITIVE — AB
Protein, ur: 30 mg/dL — AB
Specific Gravity, Urine: 1.027 (ref 1.005–1.030)
pH: 5 (ref 5.0–8.0)

## 2019-11-02 LAB — CBC
HCT: 31 % — ABNORMAL LOW (ref 36.0–46.0)
Hemoglobin: 10.3 g/dL — ABNORMAL LOW (ref 12.0–15.0)
MCH: 29.2 pg (ref 26.0–34.0)
MCHC: 33.2 g/dL (ref 30.0–36.0)
MCV: 87.8 fL (ref 80.0–100.0)
Platelets: 277 10*3/uL (ref 150–400)
RBC: 3.53 MIL/uL — ABNORMAL LOW (ref 3.87–5.11)
RDW: 12 % (ref 11.5–15.5)
WBC: 5 10*3/uL (ref 4.0–10.5)
nRBC: 0 % (ref 0.0–0.2)

## 2019-11-02 LAB — CBG MONITORING, ED
Glucose-Capillary: 434 mg/dL — ABNORMAL HIGH (ref 70–99)
Glucose-Capillary: 287 mg/dL — ABNORMAL HIGH (ref 70–99)

## 2019-11-02 LAB — LIPASE, BLOOD: Lipase: 42 U/L (ref 11–51)

## 2019-11-02 LAB — POC SARS CORONAVIRUS 2 AG -  ED: SARS Coronavirus 2 Ag: NEGATIVE

## 2019-11-02 LAB — SARS CORONAVIRUS 2 (TAT 6-24 HRS): SARS Coronavirus 2: NEGATIVE

## 2019-11-02 MED ORDER — CEPHALEXIN 500 MG PO CAPS: 500.0000 mg | ORAL_CAPSULE | Freq: Four times a day (QID) | ORAL | 0 refills | Status: AC

## 2019-11-02 MED ORDER — INSULIN ASPART 100 UNIT/ML ~~LOC~~ SOLN
4.0000 [IU] | Freq: Once | SUBCUTANEOUS | Status: AC
  Administered 2019-11-02: 4 [IU] via INTRAVENOUS

## 2019-11-02 MED ORDER — ONDANSETRON HCL 4 MG/2ML IJ SOLN
4.0000 mg | Freq: Once | INTRAMUSCULAR | Status: AC
  Administered 2019-11-02: 4 mg via INTRAVENOUS
  Filled 2019-11-02: qty 2

## 2019-11-02 MED ORDER — ONDANSETRON 4 MG PO TBDP: 4.0000 mg | ORAL_TABLET | Freq: Three times a day (TID) | ORAL | 0 refills | Status: DC | PRN

## 2019-11-02 MED ORDER — IOHEXOL 300 MG/ML  SOLN
80.0000 mL | Freq: Once | INTRAMUSCULAR | Status: AC | PRN
  Administered 2019-11-02: 80 mL via INTRAVENOUS

## 2019-11-02 MED ORDER — MORPHINE SULFATE (PF) 4 MG/ML IV SOLN
4.0000 mg | Freq: Once | INTRAVENOUS | Status: AC
  Administered 2019-11-02: 4 mg via INTRAVENOUS
  Filled 2019-11-02: qty 1

## 2019-11-02 MED ORDER — SODIUM CHLORIDE 0.9% FLUSH: 3.0000 mL | Freq: Once | INTRAVENOUS | Status: DC

## 2019-11-02 MED ORDER — SODIUM CHLORIDE 0.9 % IV SOLN
1.0000 g | Freq: Once | INTRAVENOUS | Status: AC
  Administered 2019-11-02: 1 g via INTRAVENOUS
  Filled 2019-11-02: qty 10

## 2019-11-02 MED ORDER — SODIUM CHLORIDE 0.9 % IV BOLUS
1000.0000 mL | Freq: Once | INTRAVENOUS | Status: AC
  Administered 2019-11-02: 1000 mL via INTRAVENOUS

## 2019-11-02 NOTE — ED Notes (Signed)
Patient transported to CT 

## 2019-11-02 NOTE — ED Provider Notes (Signed)
[facility] EMERGENCY DEPARTMENT Provider Note   CSN: [id number] Arrival date & time: 11/02/19  1230     History Chief Complaint  Patient presents with  . Anorexia    Tenecia Ignasiak is a 63 y.o. female +3 of hypertension, diabetes, mood disorder who presents for evaluation of 1 week of decreased appetite, generalized abdominal pain, increased urination.  She states that for the last week, she has not wanted to eat anything.  She does report that when she does eat, she does have some nausea and feels like it upsets her stomach and causes some pain.  She states that she had one episode of vomiting about a day ago.  No blood noted in emesis.  She has not had any diarrhea.  He states that she has had increased frequency of urination but has not had any pain when she pees or dysuria.  She does not notice any blood in the urine.  She states she thinks she may have lost some weight but she is unsure of how much.  She has not any fevers, night sweats, chest pain, difficulty breathing, cough.  She does report that she has been compliant with her insulin.  The history is provided by the patient.       Past Medical History:  Diagnosis Date  . Diabetes mellitus type 2 in nonobese ([place]) 06/06/2015  . Diabetes mellitus without complication ([place])   . Diabetic neuropathy ([place]) 06/06/2015  . Dyslipidemia 06/06/2015  . Encephalopathy   . Essential hypertension 06/06/2015  . Hypertension   . Mood disorder ([place]) 06/06/2015  . Pneumonia 03/11/2017  . Seizure [facility])    sees [name]. abd eeg, on keppra    Patient Active Problem List   Diagnosis Date Noted  . HCAP (healthcare-associated pneumonia) 03/11/2017  . Dyslipidemia 03/11/2017  . Diabetes mellitus with complication ([place])   . Chest pain 12/30/2016  . Seizure disorder ([place]) 12/30/2016  . Diabetic ketoacidosis without coma associated with type 2 diabetes mellitus ([place])   . DKA (diabetic ketoacidoses) ([place]) 11/15/2016  . UTI  (urinary tract infection) 11/15/2016  . Acute cystitis without hematuria   . Acute renal failure ([place])   . Hyperglycemia 11/14/2016  . Diabetes ([place]) 06/05/2016  . Altered mental status   . CKD (chronic kidney disease) stage 3, GFR 30-59 ml/min 05/26/2016  . Benign essential HTN 05/26/2016  . Seizures ([place]) 01/08/2016  . Memory loss 10/04/2015  . Acute encephalopathy 06/06/2015  . Diabetic neuropathy ([place]) 06/06/2015    Past Surgical History:  Procedure Laterality Date  . ABDOMINAL HYSTERECTOMY    . EYE SURGERY Bilateral   . TUBAL LIGATION       OB History   No obstetric history on file.     Family History  Problem Relation Age of Onset  . Heart disease Father   . Kidney disease Father   . Diabetes Mother   . Seizures Sister   . Seizures Brother     Social History   Tobacco Use  . Smoking status: Former Smoker    Types: Cigarettes  . Smokeless tobacco: Never Used  Substance Use Topics  . Alcohol use: No  . Drug use: No    Home Medications Prior to Admission medications   Medication Sig Start Date End Date Taking? Authorizing Provider  acetaminophen (TYLENOL) 500 MG tablet Take 1,000 mg by mouth every 6 (six) hours as needed for mild pain or headache.     [provider]  amitriptyline (  ELAVIL) 50 MG tablet Take 50 mg by mouth at bedtime. 05/08/15   [provider]  amLODipine-olmesartan (AZOR) 5-40 MG tablet Take 1 tablet by mouth daily. 10/08/16   [provider]  aspirin EC 81 MG tablet Take 81 mg by mouth daily.    [provider]  cephALEXin (KEFLEX) 500 MG capsule Take 1 capsule (500 mg total) by mouth 4 (four) times daily for 7 days. 11/02/19 11/09/19  Volanda Napoleon, PA-C  hydrochlorothiazide (HYDRODIURIL) 25 MG tablet Take 25 mg by mouth daily. 01/13/19   [provider]  Insulin Glargine (LANTUS SOLOSTAR) 100 UNIT/ML Solostar Pen Inject 21 Units into the skin every morning. 09/06/19   Renato Shin, MD    Insulin Pen Needle 31G X 5 MM MISC 1 each by Other route daily. Used to give daily insulin injections. 08/03/19   Renato Shin, MD  levETIRAcetam (KEPPRA) 750 MG tablet Take one tablet by mouth twice daily.  Please call [phone] to schedule yearly follow up appt. 08/04/19   Marcial Pacas, MD  metoprolol succinate (TOPROL-XL) 50 MG 24 hr tablet Take 50 mg by mouth at bedtime.  05/08/15   [provider]  ondansetron (ZOFRAN ODT) 4 MG disintegrating tablet Take 1 tablet (4 mg total) by mouth every 8 (eight) hours as needed for nausea or vomiting. 11/02/19   Volanda Napoleon, PA-C  sertraline (ZOLOFT) 50 MG tablet Take 50 mg by mouth daily. 01/07/19   [provider]  vitamin E 400 UNIT capsule Take 400 Units by mouth daily.    [provider]    Allergies    Percocet [oxycodone-acetaminophen] and Vicodin [hydrocodone-acetaminophen]  Review of Systems   Review of Systems  Constitutional: Positive for appetite change. Negative for fever.  Respiratory: Negative for cough and shortness of breath.   Cardiovascular: Negative for chest pain.  Gastrointestinal: Positive for abdominal pain, nausea and vomiting. Negative for diarrhea.  Genitourinary: Positive for frequency. Negative for dysuria and hematuria.  Neurological: Negative for headaches.  All other systems reviewed and are negative.   Physical Exam Updated Vital Signs BP (!) 171/118   Pulse 100   Temp 99.1 F (37.3 C) (Oral)   Resp 18   SpO2 99%   Physical Exam Vitals and nursing note reviewed.  Constitutional:      Appearance: Normal appearance. She is well-developed.  HENT:     Head: Normocephalic and atraumatic.  Eyes:     General: Lids are normal.     Conjunctiva/sclera: Conjunctivae normal.     Pupils: Pupils are equal, round, and reactive to light.  Cardiovascular:     Rate and Rhythm: Regular rhythm. Tachycardia present.     Pulses: Normal pulses.     Heart sounds: Normal heart sounds. No  murmur. No friction rub. No gallop.   Pulmonary:     Effort: Pulmonary effort is normal.     Breath sounds: Normal breath sounds.     Comments: Lungs clear to auscultation bilaterally.  Symmetric chest rise.  No wheezing, rales, rhonchi. Abdominal:     Palpations: Abdomen is soft. Abdomen is not rigid.     Tenderness: There is generalized abdominal tenderness. There is no guarding.     Comments: Abdomen is soft, non-distended. Generalized tenderness with no focal point. No rigidity, No guarding. No peritoneal signs.  Musculoskeletal:        General: Normal range of motion.     Cervical back: Full passive range of motion without pain.  Skin:  General: Skin is warm and dry.     Capillary Refill: Capillary refill takes less than 2 seconds.  Neurological:     Mental Status: She is alert and oriented to person, place, and time.  Psychiatric:        Speech: Speech normal.     ED Results / Procedures / Treatments   Labs (all labs ordered are listed, but only abnormal results are displayed) Labs Reviewed  COMPREHENSIVE METABOLIC PANEL - Abnormal; Notable for the following components:      Result Value   Sodium 130 (*)    Chloride 96 (*)    CO2 19 (*)    Glucose, Bld 443 (*)    Creatinine, Ser 1.48 (*)    Alkaline Phosphatase 163 (*)    GFR calc non Af Amer 37 (*)    GFR calc Af Amer 43 (*)    All other components within normal limits  CBC - Abnormal; Notable for the following components:   RBC 3.53 (*)    Hemoglobin 10.3 (*)    HCT 31.0 (*)    All other components within normal limits  URINALYSIS, ROUTINE W REFLEX MICROSCOPIC - Abnormal; Notable for the following components:   APPearance HAZY (*)    Glucose, UA >=500 (*)    Hgb urine dipstick SMALL (*)    Ketones, ur 20 (*)    Protein, ur 30 (*)    Nitrite POSITIVE (*)    Leukocytes,Ua LARGE (*)    Bacteria, UA RARE (*)    All other components within normal limits  CBG MONITORING, ED - Abnormal; Notable for the following  components:   Glucose-Capillary 434 (*)    All other components within normal limits  CBG MONITORING, ED - Abnormal; Notable for the following components:   Glucose-Capillary 287 (*)    All other components within normal limits  POCT I-STAT EG7 - Abnormal; Notable for the following components:   Acid-base deficit 4.0 (*)    HCT 27.0 (*)    Hemoglobin 9.2 (*)    All other components within normal limits  SARS CORONAVIRUS 2 (TAT 6-24 HRS)  LIPASE, BLOOD  BLOOD GAS, VENOUS  POC SARS CORONAVIRUS 2 AG -  ED    EKG EKG Interpretation  Date/Time:  Wednesday November 02 2019 15:44:43 EST Ventricular Rate:  103 PR Interval:    QRS Duration: 93 QT Interval:  357 QTC Calculation: 468 R Axis:   -10 Text Interpretation: Sinus tachycardia Abnormal R-wave progression, early transition Since last tracing rate faster Confirmed by Isla Pence [phone]) on 11/02/2019 3:47:00 PM   Radiology CT ABDOMEN PELVIS W CONTRAST  Result Date: 11/02/2019 CLINICAL DATA:  Acute, diffuse abdominal pain and loss of appetite for the past week. Nausea and vomiting. EXAM: CT ABDOMEN AND PELVIS WITH CONTRAST TECHNIQUE: Multidetector CT imaging of the abdomen and pelvis was performed using the standard protocol following bolus administration of intravenous contrast. CONTRAST:  27mL OMNIPAQUE IOHEXOL 300 MG/ML  SOLN COMPARISON:  07/24/2019, 01/28/2019 and 10/17/2014. Abdomen ultrasound dated 02/01/2019. FINDINGS: Lower chest: Normal sized heart. Small amount of linear atelectasis or scarring at the left lung base. Hepatobiliary: No focal liver abnormality is seen. No gallstones, gallbladder wall thickening, or biliary dilatation. Pancreas: Unremarkable. No pancreatic ductal dilatation or surrounding inflammatory changes. Spleen: Normal in size without focal abnormality. Adrenals/Urinary Tract: Normal appearing adrenal glands. Small bilateral renal cysts. These have increased in size and number with an interval 6 mm oval  low density area in the anterior  aspect of the lower pole of the right kidney on image number 21 series 8. This is slightly higher in density than expected for a small simple cyst in the axial plane. This appears more cystic in the coronal and sagittal planes. Small amount of air in the urinary bladder, compatible with recent catheterization. Normal appearing ureters. No calculi or hydronephrosis. Stomach/Bowel: Stomach is within normal limits. Appendix appears normal. No evidence of bowel wall thickening, distention, or inflammatory changes. Vascular/Lymphatic: No significant vascular findings are present. No enlarged abdominal or pelvic lymph nodes. Reproductive: Status post hysterectomy. No adnexal masses. Other: No abdominal wall hernia or abnormality. No abdominopelvic ascites. Musculoskeletal: Unremarkable bones. IMPRESSION: 1. No acute abnormality. 2. 6 mm probable mildly complicated cyst in the anterior aspect of the lower pole of the right kidney. This was not seen on the ultrasound dated 02/01/2019. A repeat limited renal ultrasound is recommended to determine if this can be visualized better characterization. If not, this could be further evaluated with elective pre and postcontrast magnetic resonance imaging of the kidneys. Electronically Signed   By: Claudie Revering M.D.   On: 11/02/2019 16:16    Procedures Procedures (including critical care time)  Medications Ordered in ED Medications  sodium chloride flush (NS) 0.9 % injection 3 mL (3 mLs Intravenous Not Given 11/02/19 1400)  sodium chloride 0.9 % bolus 1,000 mL (0 mLs Intravenous Stopped 11/02/19 1643)  ondansetron (ZOFRAN) injection 4 mg (4 mg Intravenous Given 11/02/19 1516)  insulin aspart (novoLOG) injection 4 Units (4 Units Intravenous Given 11/02/19 1517)  morphine 4 MG/ML injection 4 mg (4 mg Intravenous Given 11/02/19 1540)  iohexol (OMNIPAQUE) 300 MG/ML solution 80 mL (80 mLs Intravenous Contrast Given 11/02/19 1554)  cefTRIAXone  (ROCEPHIN) 1 g in sodium chloride 0.9 % 100 mL IVPB (0 g Intravenous Stopped 11/02/19 2019)    ED Course  I have reviewed the triage vital signs and the nursing notes.  Pertinent labs & imaging results that were available during my care of the patient were reviewed by me and considered in my medical decision making (see chart for details).    MDM Rules/Calculators/A&P                      63 year old female who presents for evaluation generalized abdominal pain, decreased appetite, increased urinary frequency that has been ongoing for about a week.  She had one episode of vomiting.  None since then.  Reports some pain whenever she eats.  States the pain is generalized.  No fevers.  No dysuria, hematuria.  Initially arrival, she is slightly tachycardic and hypertensive.  She does have history of high blood pressure.  She is some generalized tenderness on abdominal exam.  Consider DKA versus infectious etiology.  Plan to check labs.  Lipase unremarkable.  CBC shows no leukocytosis.  Hemoglobin is 10.3.  This is consistent with her baseline.  CMP shows glucose of 443.  Bicarb is 19.  Anion gap is 15.  BUN is 18, creatinine is 1.48.  I suspect is more hyperglycemia rather than DKA she does not have a high anion gap.  Will check VBG.  VBG shows pH of 7.285.  Bicarb is 22.4.  Repeat CBG after insulin is 287.  CT and pelvis shows no acute abnormality.  There is a 6 mm kidney cyst noted in the right kidney.  Initial Covid negative.  We will plan for outpatient Covid.  Urine shows nitrites, leukocytes, bacteria.  We will plan to  treat since she is having increased urinary frequency.  Will give patient dose of Rocephin here and sent home on outpatient antibiotics.  Discussed results with patient.  Patient has not had any vomiting here in the emergency department.  She has had some improvement in pain.  She is resting comfortably.  I discussed with her the need she needs to follow-up with her primary  care doctor regarding her symptoms.  At this time, patient exhibits no emergent life-threatening condition that require further evaluation in ED or admission. Discussed patient with Dr. Gilford Raid who is agreeable to plan.  Patient had ample opportunity for questions and discussion. All patient's questions were answered with full understanding. Strict return precautions discussed. Patient expresses understanding and agreement to plan.   Portions of this note were generated with [employer]. Dictation errors may occur despite best attempts at proofreading.  Final Clinical Impression(s) / ED Diagnoses Final diagnoses:  Nausea  Decreased appetite  Cyst of right kidney  Acute cystitis without hematuria    Rx / DC Orders ED Discharge Orders         Ordered    cephALEXin (KEFLEX) 500 MG capsule  4 times daily     11/02/19 1947    ondansetron (ZOFRAN ODT) 4 MG disintegrating tablet  Every 8 hours PRN     11/02/19 1947           Desma Mcgregor 11/02/19 2229    Isla Pence, MD 11/02/19 2314

## 2019-11-02 NOTE — ED Triage Notes (Signed)
Pt to ER for evaluation of loss of appetite x1 week. Reports also is urinating a lot. Reports diabetes history. Reports nausea with one episode of vomiting Saturday.

## 2019-11-02 NOTE — Discharge Instructions (Addendum)
As we discussed, your CT scan showed a small renal cyst noted on the right kidney.  This needs follow-up by your primary care doctor.  Take antibiotics as directed. Please take all of your antibiotics until finished.  You can take Zofran as needed for nausea.  Follow-up with your primary care doctor next 3 to 4 days.  Return the emergency department for any vomiting, abdominal pain, fevers or any other worsening or concerning symptoms.

## 2019-11-02 NOTE — ED Notes (Signed)
Pt verbalized understanding of discharge instructions. Follow up care and prescriptions reviewed, no further questions.

## 2019-11-02 NOTE — ED Notes (Signed)
ED Provider at bedside. 

## 2019-12-02 ENCOUNTER — Other Ambulatory Visit: Payer: Self-pay

## 2019-12-04 ENCOUNTER — Emergency Department (HOSPITAL_COMMUNITY)
Admission: EM | Admit: 2019-12-04 | Discharge: 2019-12-05 | Disposition: A | Discharge status: Home / Self Care | Payer: Medicare HMO | Attending: Emergency Medicine | Admitting: Emergency Medicine

## 2019-12-04 ENCOUNTER — Emergency Department (HOSPITAL_COMMUNITY): Payer: Medicare HMO

## 2019-12-04 ENCOUNTER — Other Ambulatory Visit: Payer: Self-pay

## 2019-12-04 DIAGNOSIS — Z794 Long term (current) use of insulin: Secondary | ICD-10-CM | POA: Insufficient documentation

## 2019-12-04 DIAGNOSIS — R531 Weakness: Secondary | ICD-10-CM | POA: Diagnosis not present

## 2019-12-04 DIAGNOSIS — R739 Hyperglycemia, unspecified: Secondary | ICD-10-CM

## 2019-12-04 DIAGNOSIS — N390 Urinary tract infection, site not specified: Secondary | ICD-10-CM | POA: Diagnosis not present

## 2019-12-04 DIAGNOSIS — Z79899 Other long term (current) drug therapy: Secondary | ICD-10-CM | POA: Diagnosis not present

## 2019-12-04 DIAGNOSIS — E1165 Type 2 diabetes mellitus with hyperglycemia: Secondary | ICD-10-CM | POA: Insufficient documentation

## 2019-12-04 DIAGNOSIS — Z7982 Long term (current) use of aspirin: Secondary | ICD-10-CM | POA: Insufficient documentation

## 2019-12-04 DIAGNOSIS — I129 Hypertensive chronic kidney disease with stage 1 through stage 4 chronic kidney disease, or unspecified chronic kidney disease: Secondary | ICD-10-CM | POA: Diagnosis not present

## 2019-12-04 DIAGNOSIS — N3000 Acute cystitis without hematuria: Secondary | ICD-10-CM | POA: Diagnosis not present

## 2019-12-04 DIAGNOSIS — E1122 Type 2 diabetes mellitus with diabetic chronic kidney disease: Secondary | ICD-10-CM | POA: Diagnosis not present

## 2019-12-04 DIAGNOSIS — N183 Chronic kidney disease, stage 3 unspecified: Secondary | ICD-10-CM | POA: Insufficient documentation

## 2019-12-04 DIAGNOSIS — Z87891 Personal history of nicotine dependence: Secondary | ICD-10-CM | POA: Diagnosis not present

## 2019-12-04 DIAGNOSIS — R000 Tachycardia, unspecified: Secondary | ICD-10-CM | POA: Diagnosis not present

## 2019-12-04 MED ORDER — SODIUM CHLORIDE 0.9% FLUSH
3.0000 mL | Freq: Once | INTRAVENOUS | Status: AC
  Administered 2019-12-05: 3 mL via INTRAVENOUS

## 2019-12-04 MED ORDER — SODIUM CHLORIDE 0.9 % IV BOLUS (SEPSIS)
1000.0000 mL | Freq: Once | INTRAVENOUS | Status: AC
  Administered 2019-12-05: 1000 mL via INTRAVENOUS

## 2019-12-04 MED ORDER — SODIUM CHLORIDE 0.9 % IV SOLN
1000.0000 mL | INTRAVENOUS | Status: DC
  Administered 2019-12-05: 1000 mL via INTRAVENOUS

## 2019-12-04 NOTE — ED Triage Notes (Signed)
Pt is c/o weakness dizziness and weight loss for over a week her heart beats fast whenever she tries to walk   No pain at present

## 2019-12-04 NOTE — ED Provider Notes (Addendum)
[facility] EMERGENCY DEPARTMENT Provider Note  CSN: [id number] Arrival date & time: 12/04/19 1931  Chief Complaint(s) Weakness  HPI Tricia Huynh is a 64 y.o. female here with generalized fatigue  The history is provided by the patient.  Weakness Severity:  Severe Onset quality:  Gradual Duration:  1 week Timing:  Constant Progression:  Worsening Chronicity:  Recurrent Context: not alcohol use, not change in medication, not dehydration, not drug use, not increased activity, not recent infection, not stress and not urinary tract infection   Relieved by:  Nothing Worsened by:  Activity Associated symptoms: dizziness, frequency, headaches (intermittent; resolved), nausea and near-syncope   Associated symptoms: no anorexia, no aphasia, no ataxia, no diarrhea, no fever, no foul-smelling urine, no melena, no shortness of breath and no vomiting   Risk factors: diabetes     Past Medical History Past Medical History:  Diagnosis Date  . Diabetes mellitus type 2 in nonobese ([place]) 06/06/2015  . Diabetes mellitus without complication ([place])   . Diabetic neuropathy ([place]) 06/06/2015  . Dyslipidemia 06/06/2015  . Encephalopathy   . Essential hypertension 06/06/2015  . Hypertension   . Mood disorder ([place]) 06/06/2015  . Pneumonia 03/11/2017  . Seizure [facility])    sees [name]. abd eeg, on keppra   Patient Active Problem List   Diagnosis Date Noted  . HCAP (healthcare-associated pneumonia) 03/11/2017  . Dyslipidemia 03/11/2017  . Diabetes mellitus with complication ([place])   . Chest pain 12/30/2016  . Seizure disorder ([place]) 12/30/2016  . Diabetic ketoacidosis without coma associated with type 2 diabetes mellitus ([place])   . DKA (diabetic ketoacidoses) ([place]) 11/15/2016  . UTI (urinary tract infection) 11/15/2016  . Acute cystitis without hematuria   . Acute renal failure ([place])   . Hyperglycemia 11/14/2016  . Diabetes ([place]) 06/05/2016  . Altered mental status   . CKD  (chronic kidney disease) stage 3, GFR 30-59 ml/min 05/26/2016  . Benign essential HTN 05/26/2016  . Seizures ([place]) 01/08/2016  . Memory loss 10/04/2015  . Acute encephalopathy 06/06/2015  . Diabetic neuropathy ([place]) 06/06/2015   Home Medication(s) Prior to Admission medications   Medication Sig Start Date End Date Taking? Authorizing Provider  acetaminophen (TYLENOL) 500 MG tablet Take 1,000 mg by mouth every 6 (six) hours as needed for mild pain or headache.     [provider]  amitriptyline (ELAVIL) 50 MG tablet Take 50 mg by mouth at bedtime. 05/08/15   [provider]  amLODipine-olmesartan (AZOR) 5-40 MG tablet Take 1 tablet by mouth daily. 10/08/16   [provider]  aspirin EC 81 MG tablet Take 81 mg by mouth daily.    [provider]  cephALEXin (KEFLEX) 500 MG capsule Take 1 capsule (500 mg total) by mouth 3 (three) times daily for 5 days. 12/05/19 12/10/19  Fatima Blank, MD  hydrochlorothiazide (HYDRODIURIL) 25 MG tablet Take 25 mg by mouth daily. 01/13/19   [provider]  Insulin Glargine (LANTUS SOLOSTAR) 100 UNIT/ML Solostar Pen Inject 21 Units into the skin every morning. 09/06/19   Renato Shin, MD  Insulin Pen Needle 31G X 5 MM MISC 1 each by Other route daily. Used to give daily insulin injections. 08/03/19   Renato Shin, MD  levETIRAcetam (KEPPRA) 750 MG tablet Take one tablet by mouth twice daily.  Please call [phone] to schedule yearly follow up appt. 08/04/19   Marcial Pacas, MD  metoprolol succinate (TOPROL-XL) 50 MG 24 hr tablet Take 50 mg by mouth at bedtime.  05/08/15   [provider]  ondansetron (ZOFRAN ODT) 4 MG disintegrating tablet Take 1 tablet (4 mg total) by mouth every 8 (eight) hours as needed for nausea or vomiting. 11/02/19   Volanda Napoleon, PA-C  sertraline (ZOLOFT) 50 MG tablet Take 50 mg by mouth daily. 01/07/19   [provider]  vitamin E 400 UNIT capsule Take 400 Units by mouth  daily.    [provider]                                                                                                                                    Past Surgical History Past Surgical History:  Procedure Laterality Date  . ABDOMINAL HYSTERECTOMY    . EYE SURGERY Bilateral   . TUBAL LIGATION     Family History Family History  Problem Relation Age of Onset  . Heart disease Father   . Kidney disease Father   . Diabetes Mother   . Seizures Sister   . Seizures Brother     Social History Social History   Tobacco Use  . Smoking status: Former Smoker    Types: Cigarettes  . Smokeless tobacco: Never Used  Substance Use Topics  . Alcohol use: No  . Drug use: No   Allergies Percocet [oxycodone-acetaminophen] and Vicodin [hydrocodone-acetaminophen]  Review of Systems Review of Systems  Constitutional: Negative for fever.  Respiratory: Negative for shortness of breath.   Cardiovascular: Positive for near-syncope.  Gastrointestinal: Positive for nausea. Negative for anorexia, diarrhea, melena and vomiting.  Genitourinary: Positive for frequency.  Neurological: Positive for dizziness, weakness and headaches (intermittent; resolved).   All other systems are reviewed and are negative for acute change except as noted in the HPI  Physical Exam Vital Signs  I have reviewed the triage vital signs BP (!) 161/115 (BP Location: Left Arm)   Pulse 95   Temp 98.5 F (36.9 C) (Oral)   Resp 16   Ht 5\' 7"  (1.702 m)   Wt 64.9 kg   SpO2 98%   BMI 22.40 kg/m   Physical Exam Vitals reviewed.  Constitutional:      General: She is not in acute distress.    Appearance: She is well-developed. She is not diaphoretic.  HENT:     Head: Normocephalic and atraumatic.     Nose: Nose normal.  Eyes:     General: No scleral icterus.       Right eye: No discharge.        Left eye: No discharge.     Conjunctiva/sclera: Conjunctivae normal.     Pupils: Pupils are equal, round,  and reactive to light.  Cardiovascular:     Rate and Rhythm: Regular rhythm. Tachycardia present.     Heart sounds: No murmur. No friction rub. No gallop.   Pulmonary:     Effort: Pulmonary effort is normal. No respiratory distress.     Breath sounds: Normal  breath sounds. No stridor. No rales.  Abdominal:     General: There is no distension.     Palpations: Abdomen is soft.     Tenderness: There is no abdominal tenderness.  Musculoskeletal:        General: No tenderness.     Cervical back: Normal range of motion and neck supple.  Skin:    General: Skin is warm and dry.     Findings: No erythema or rash.  Neurological:     Mental Status: She is alert and oriented to person, place, and time.     ED Results and Treatments Labs (all labs ordered are listed, but only abnormal results are displayed) Labs Reviewed  CBC - Abnormal; Notable for the following components:      Result Value   Hemoglobin 11.5 (*)    HCT 35.5 (*)    All other components within normal limits  COMPREHENSIVE METABOLIC PANEL - Abnormal; Notable for the following components:   Sodium 126 (*)    Chloride 87 (*)    Glucose, Bld 829 (*)    BUN 32 (*)    Creatinine, Ser 1.84 (*)    Total Protein 9.1 (*)    AST 13 (*)    Alkaline Phosphatase 176 (*)    GFR calc non Af Amer 29 (*)    GFR calc Af Amer 33 (*)    Anion gap 17 (*)    All other components within normal limits  URINALYSIS, ROUTINE W REFLEX MICROSCOPIC - Abnormal; Notable for the following components:   Color, Urine STRAW (*)    APPearance HAZY (*)    Glucose, UA >=500 (*)    Hgb urine dipstick SMALL (*)    Ketones, ur 5 (*)    Leukocytes,Ua LARGE (*)    Bacteria, UA RARE (*)    All other components within normal limits  CBG MONITORING, ED - Abnormal; Notable for the following components:   Glucose-Capillary >600 (*)    All other components within normal limits  CBG MONITORING, ED - Abnormal; Notable for the following components:    Glucose-Capillary 552 (*)    All other components within normal limits  CBG MONITORING, ED - Abnormal; Notable for the following components:   Glucose-Capillary 320 (*)    All other components within normal limits  CBG MONITORING, ED - Abnormal; Notable for the following components:   Glucose-Capillary 204 (*)    All other components within normal limits  TSH  T4, FREE  TROPONIN I (HIGH SENSITIVITY)                                                                                                                         EKG  EKG Interpretation  Date/Time:  Sunday December 04 2019 19:41:19 EST Ventricular Rate:  122 PR Interval:  156 QRS Duration: 72 QT Interval:  318 QTC Calculation: 453 R Axis:   -21 Text Interpretation: Sinus tachycardia Right atrial enlargement Nonspecific ST abnormality Abnormal  ECG Otherwise no significant change Confirmed by Addison Lank [phone]) on 12/04/2019 11:09:43 PM      Radiology DG Chest 2 View  Result Date: 12/04/2019 CLINICAL DATA:  Weakness EXAM: CHEST - 2 VIEW COMPARISON:  01/07/2019 FINDINGS: There is a small rounded density projecting over the anterior fifth rib on the right. This appears to be new since prior study. There is no pneumothorax. No large pleural effusion. Aortic calcifications are noted. The heart size is normal. There is no acute osseous abnormality. IMPRESSION: 1. No acute cardiopulmonary process. 2. Small rounded density projecting over the anterior fifth rib on the right. A 4-6 week follow-up two-view chest x-ray is recommended to confirm resolution of this finding. Electronically Signed   By: Constance Holster M.D.   On: 12/04/2019 20:19    Pertinent labs & imaging results that were available during my care of the patient were reviewed by me and considered in my medical decision making (see chart for details).  Medications Ordered in ED Medications  sodium chloride 0.9 % bolus 1,000 mL (0 mLs Intravenous Stopped 12/05/19 0111)     Followed by  sodium chloride 0.9 % bolus 1,000 mL (0 mLs Intravenous Stopped 12/05/19 0150)    Followed by  0.9 %  sodium chloride infusion (1,000 mLs Intravenous New Bag/Given 12/05/19 0121)  sodium chloride flush (NS) 0.9 % injection 3 mL (3 mLs Intravenous Given 12/05/19 0013)  insulin aspart (novoLOG) injection 10 Units (10 Units Subcutaneous Given 12/05/19 0152)  sodium chloride 0.9 % bolus 1,000 mL (0 mLs Intravenous Stopped 12/05/19 0525)  cephALEXin (KEFLEX) capsule 500 mg (500 mg Oral Given 12/05/19 0418)                                                                                                                                    Procedures .Critical Care Performed by: Fatima Blank, MD Authorized by: Fatima Blank, MD    CRITICAL CARE Performed by: Grayce Sessions Tishie Altmann Total critical care time: 55 minutes Critical care time was exclusive of separately billable procedures and treating other patients. Critical care was necessary to treat or prevent imminent or life-threatening deterioration. Critical care was time spent personally by me on the following activities: development of treatment plan with patient and/or surrogate as well as nursing, discussions with consultants, evaluation of patient's response to treatment, examination of patient, obtaining history from patient or surrogate, ordering and performing treatments and interventions, ordering and review of laboratory studies, ordering and review of radiographic studies, pulse oximetry and re-evaluation of patient's condition.    (including critical care time)  Medical Decision Making / ED Course I have reviewed the nursing notes for this encounter and the patient's prior records (if available in EHR or on provided paperwork).   Tricia Huynh was evaluated in Emergency Department on 12/05/2019 for the symptoms described in the history of present illness. She was evaluated in the context of  the  global COVID-19 pandemic, which necessitated consideration that the patient might be at risk for infection with the SARS-CoV-2 virus that causes COVID-19. Institutional protocols and algorithms that pertain to the evaluation of patients at risk for COVID-19 are in a state of rapid change based on information released by regulatory bodies including the CDC and federal and state organizations. These policies and algorithms were followed during the patient's care in the ED.    Clinical Course as of Dec 04 648  Mon Dec 05, 2019  0137 Patient presents with generalized fatigue. CBG greater than 600. Mildly tachycardic.  Started on 2 L IV fluids. CMP notable for hyperglycemia and mild AKI.  Bicarb 22 not consistent with acidosis. Likely hyperglycemia without DKA.  We will provide patient with subcu insulin, complete IV fluids and recheck CBG.   [PC]  6440 CBGs improved down to 204 after 3L IVF and 10 U insulin.  UA notable for possible UTI. Keflex given.   [PC]    Clinical Course User Index [PC] Shaneal Barasch, Grayce Sessions, MD   The patient appears reasonably screened and/or stabilized for discharge and I doubt any other medical condition or other [facility] requiring further screening, evaluation, or treatment in the ED at this time prior to discharge.  The patient is safe for discharge with strict return precautions.   Final Clinical Impression(s) / ED Diagnoses Final diagnoses:  Hyperglycemia  Lower urinary tract infectious disease    The patient appears reasonably screened and/or stabilized for discharge and I doubt any other medical condition or other [facility] requiring further screening, evaluation, or treatment in the ED at this time prior to discharge.  Disposition: Discharge  Condition: Good  I have discussed the results, Dx and Tx plan with the patient who expressed understanding and agree(s) with the plan. Discharge instructions discussed at great length. The patient was given strict  return precautions who verbalized understanding of the instructions. No further questions at time of discharge.    ED Discharge Orders         Ordered    cephALEXin (KEFLEX) 500 MG capsule  3 times daily     12/05/19 0650            Follow Up: Jonathon Jordan, MD [address] [place] [place] [address] [phone]   in 5-7 days to recheck urine to ensure appropriate treatment of infection and to discuss diabetes control      This chart was dictated using voice recognition software.  Despite best efforts to proofread,  errors can occur which can change the documentation meaning.   Fatima Blank, MD 12/05/19 3329    Fatima Blank, MD 12/23/19 [phone]

## 2019-12-05 DIAGNOSIS — E1165 Type 2 diabetes mellitus with hyperglycemia: Secondary | ICD-10-CM | POA: Diagnosis not present

## 2019-12-05 LAB — CBC
HCT: 35.5 % — ABNORMAL LOW (ref 36.0–46.0)
Hemoglobin: 11.5 g/dL — ABNORMAL LOW (ref 12.0–15.0)
MCH: 29.4 pg (ref 26.0–34.0)
MCHC: 32.4 g/dL (ref 30.0–36.0)
MCV: 90.8 fL (ref 80.0–100.0)
Platelets: 309 10*3/uL (ref 150–400)
RBC: 3.91 MIL/uL (ref 3.87–5.11)
RDW: 12.4 % (ref 11.5–15.5)
WBC: 5.4 10*3/uL (ref 4.0–10.5)
nRBC: 0 % (ref 0.0–0.2)

## 2019-12-05 LAB — COMPREHENSIVE METABOLIC PANEL
ALT: 12 U/L (ref 0–44)
AST: 13 U/L — ABNORMAL LOW (ref 15–41)
Albumin: 4.3 g/dL (ref 3.5–5.0)
Alkaline Phosphatase: 176 U/L — ABNORMAL HIGH (ref 38–126)
Anion gap: 17 — ABNORMAL HIGH (ref 5–15)
BUN: 32 mg/dL — ABNORMAL HIGH (ref 8–23)
CO2: 22 mmol/L (ref 22–32)
Calcium: 10.2 mg/dL (ref 8.9–10.3)
Chloride: 87 mmol/L — ABNORMAL LOW (ref 98–111)
Creatinine, Ser: 1.84 mg/dL — ABNORMAL HIGH (ref 0.44–1.00)
GFR calc Af Amer: 33 mL/min — ABNORMAL LOW (ref >60)
GFR calc non Af Amer: 29 mL/min — ABNORMAL LOW (ref >60)
Glucose, Bld: 829 mg/dL (ref 70–99)
Potassium: 5.1 mmol/L (ref 3.5–5.1)
Sodium: 126 mmol/L — ABNORMAL LOW (ref 135–145)
Total Bilirubin: 0.8 mg/dL (ref 0.3–1.2)
Total Protein: 9.1 g/dL — ABNORMAL HIGH (ref 6.5–8.1)

## 2019-12-05 LAB — T4, FREE: Free T4: 0.97 ng/dL (ref 0.61–1.12)

## 2019-12-05 LAB — URINALYSIS, ROUTINE W REFLEX MICROSCOPIC
Bilirubin Urine: NEGATIVE
Glucose, UA: >=500 mg/dL — AB
Ketones, ur: 5 mg/dL — AB
Nitrite: NEGATIVE
Protein, ur: NEGATIVE mg/dL
Specific Gravity, Urine: 1.021 (ref 1.005–1.030)
pH: 5 (ref 5.0–8.0)

## 2019-12-05 LAB — CBG MONITORING, ED
Glucose-Capillary: 204 mg/dL — ABNORMAL HIGH (ref 70–99)
Glucose-Capillary: 320 mg/dL — ABNORMAL HIGH (ref 70–99)
Glucose-Capillary: 552 mg/dL (ref 70–99)
Glucose-Capillary: >600 mg/dL (ref 70–99)

## 2019-12-05 LAB — TSH: TSH: 0.395 u[IU]/mL (ref 0.350–4.500)

## 2019-12-05 LAB — TROPONIN I (HIGH SENSITIVITY): Troponin I (High Sensitivity): 5 ng/L (ref <18)

## 2019-12-05 MED ORDER — SODIUM CHLORIDE 0.9 % IV BOLUS
1000.0000 mL | Freq: Once | INTRAVENOUS | Status: AC
  Administered 2019-12-05: 1000 mL via INTRAVENOUS

## 2019-12-05 MED ORDER — CEPHALEXIN 250 MG PO CAPS
500.0000 mg | ORAL_CAPSULE | Freq: Once | ORAL | Status: AC
  Administered 2019-12-05: 500 mg via ORAL
  Filled 2019-12-05: qty 2

## 2019-12-05 MED ORDER — CEPHALEXIN 500 MG PO CAPS: 500.0000 mg | ORAL_CAPSULE | Freq: Three times a day (TID) | ORAL | 0 refills | Status: AC

## 2019-12-05 MED ORDER — INSULIN ASPART 100 UNIT/ML ~~LOC~~ SOLN
10.0000 [IU] | Freq: Once | SUBCUTANEOUS | Status: AC
  Administered 2019-12-05: 02:00:00 10 [IU] via SUBCUTANEOUS

## 2019-12-05 NOTE — ED Notes (Signed)
Date and time results received: 12/05/19 0110 (use smartphrase ".now" to insert current time)  Test: Glucose  Critical Value: 829  Name of Provider Notified: Addison Lank  Orders Received? Or Actions Taken?: Orders Received - See Orders for details

## 2019-12-05 NOTE — ED Notes (Signed)
Pt encouraged to urinate

## 2019-12-05 NOTE — ED Notes (Signed)
Glucose >600. Nurse aware.

## 2019-12-06 ENCOUNTER — Ambulatory Visit: Payer: Medicare HMO | Admitting: Endocrinology

## 2019-12-09 DIAGNOSIS — E1110 Type 2 diabetes mellitus with ketoacidosis without coma: Secondary | ICD-10-CM | POA: Diagnosis not present

## 2019-12-09 DIAGNOSIS — N179 Acute kidney failure, unspecified: Secondary | ICD-10-CM | POA: Diagnosis not present

## 2019-12-09 DIAGNOSIS — Z794 Long term (current) use of insulin: Secondary | ICD-10-CM | POA: Diagnosis not present

## 2019-12-12 ENCOUNTER — Telehealth: Payer: Self-pay | Admitting: Endocrinology

## 2019-12-12 NOTE — Telephone Encounter (Signed)
please contact patient: Ov is due

## 2019-12-12 NOTE — Telephone Encounter (Signed)
Per PCP and Dr. Cordelia Pen request, please call pt to schedule appt

## 2019-12-15 NOTE — Telephone Encounter (Signed)
LMTCB to schedule appointment °

## 2019-12-16 ENCOUNTER — Other Ambulatory Visit: Payer: Self-pay

## 2019-12-20 ENCOUNTER — Other Ambulatory Visit: Payer: Self-pay

## 2019-12-20 ENCOUNTER — Encounter: Payer: Self-pay | Admitting: Endocrinology

## 2019-12-20 ENCOUNTER — Ambulatory Visit (INDEPENDENT_AMBULATORY_CARE_PROVIDER_SITE_OTHER): Payer: Medicare HMO | Admitting: Endocrinology

## 2019-12-20 VITALS — BP 132/80 | HR 117 | Ht 67.0 in | Wt 130.0 lb

## 2019-12-20 DIAGNOSIS — E1042 Type 1 diabetes mellitus with diabetic polyneuropathy: Secondary | ICD-10-CM

## 2019-12-20 LAB — POCT GLYCOSYLATED HEMOGLOBIN (HGB A1C): Hemoglobin A1C: 14.1 % — AB (ref 4.0–5.6)

## 2019-12-20 MED ORDER — ACCU-CHEK AVIVA PLUS W/DEVICE KIT: 1.0000 | PACK | Freq: Two times a day (BID) | 0 refills | Status: DC

## 2019-12-20 MED ORDER — ACCU-CHEK AVIVA PLUS VI STRP: 1.0000 | ORAL_STRIP | Freq: Two times a day (BID) | 2 refills | Status: DC

## 2019-12-20 MED ORDER — ONETOUCH VERIO VI STRP: 1.0000 | ORAL_STRIP | Freq: Two times a day (BID) | 3 refills | Status: DC

## 2019-12-20 MED ORDER — LANTUS SOLOSTAR 100 UNIT/ML ~~LOC~~ SOPN: 24.0000 [IU] | PEN_INJECTOR | SUBCUTANEOUS | 3 refills | Status: DC

## 2019-12-20 MED ORDER — ACCU-CHEK SOFTCLIX LANCETS MISC: 1.0000 | Freq: Two times a day (BID) | 2 refills | Status: DC

## 2019-12-20 NOTE — Progress Notes (Signed)
Subjective:    Patient ID: Tricia Huynh, female    DOB: 1955-11-25, 64 y.o.   MRN: [id number]  HPI Pt returns for f/u of diabetes mellitus: DM type: 1 Dx'ed: 2094 Complications: polyneuropathy, DR, and renal insuff.   Therapy: insulin since 2010.   GDM: never DKA: once, in late 2017 Severe hypoglycemia: last episode was 2020.   Pancreatitis: never.  SDOH: She takes QD insulin, due to noncompliance.  Other: insurance declined V-GO.   Interval history: she brings her meter with her cbg's which I have reviewed today.  there are 9 total cbg's.  cbg varies from 106-506.  It is in general higher as the day goes on.  pt states she feels well in general.  Pt says she never miss the Lantus Past Medical History:  Diagnosis Date  . Diabetes mellitus type 2 in nonobese ([place]) 06/06/2015  . Diabetes mellitus without complication ([place])   . Diabetic neuropathy ([place]) 06/06/2015  . Dyslipidemia 06/06/2015  . Encephalopathy   . Essential hypertension 06/06/2015  . Hypertension   . Mood disorder ([place]) 06/06/2015  . Pneumonia 03/11/2017  . Seizure [facility])    sees [name]. abd eeg, on keppra    Past Surgical History:  Procedure Laterality Date  . ABDOMINAL HYSTERECTOMY    . EYE SURGERY Bilateral   . TUBAL LIGATION      Social History   Socioeconomic History  . Marital status: Single    Spouse name: Not on file  . Number of children: 3  . Years of education: 10  . Highest education level: Not on file  Occupational History  . Occupation: Disabled  Tobacco Use  . Smoking status: Former Smoker    Types: Cigarettes  . Smokeless tobacco: Never Used  Substance and Sexual Activity  . Alcohol use: No  . Drug use: No  . Sexual activity: Yes  Other Topics Concern  . Not on file  Social History Narrative   Lives at home with her grandchildren.   Right-handed.   No caffeine use.   Social Determinants of Health   Financial Resource Strain:   . Difficulty of Paying Living Expenses: Not  on file  Food Insecurity:   . Worried About Charity fundraiser in the Last Year: Not on file  . Ran Out of Food in the Last Year: Not on file  Transportation Needs:   . Lack of Transportation (Medical): Not on file  . Lack of Transportation (Non-Medical): Not on file  Physical Activity:   . Days of Exercise per Week: Not on file  . Minutes of Exercise per Session: Not on file  Stress:   . Feeling of Stress : Not on file  Social Connections:   . Frequency of Communication with Friends and Family: Not on file  . Frequency of Social Gatherings with Friends and Family: Not on file  . Attends Religious Services: Not on file  . Active Member of Clubs or Organizations: Not on file  . Attends Archivist Meetings: Not on file  . Marital Status: Not on file  Intimate Partner Violence:   . Fear of Current or Ex-Partner: Not on file  . Emotionally Abused: Not on file  . Physically Abused: Not on file  . Sexually Abused: Not on file    Current Outpatient Medications on File Prior to Visit  Medication Sig Dispense Refill  . acetaminophen (TYLENOL) 500 MG tablet Take 1,000 mg by mouth every 6 (six) hours as needed for  mild pain or headache.     Marland Kitchen amitriptyline (ELAVIL) 50 MG tablet Take 50 mg by mouth at bedtime.  1  . amLODipine-olmesartan (AZOR) 5-40 MG tablet Take 1 tablet by mouth daily.    Marland Kitchen aspirin EC 81 MG tablet Take 81 mg by mouth daily.    . hydrochlorothiazide (HYDRODIURIL) 25 MG tablet Take 25 mg by mouth daily.    Marland Kitchen levETIRAcetam (KEPPRA) 750 MG tablet Take one tablet by mouth twice daily.  Please call [phone] to schedule yearly follow up appt. 180 tablet 1  . metoprolol succinate (TOPROL-XL) 50 MG 24 hr tablet Take 50 mg by mouth at bedtime.   1  . ondansetron (ZOFRAN ODT) 4 MG disintegrating tablet Take 1 tablet (4 mg total) by mouth every 8 (eight) hours as needed for nausea or vomiting. 6 tablet 0  . sertraline (ZOLOFT) 50 MG tablet Take 50 mg by mouth daily.    .  vitamin E 400 UNIT capsule Take 400 Units by mouth daily.     No current facility-administered medications on file prior to visit.    Allergies  Allergen Reactions  . Percocet [Oxycodone-Acetaminophen] Nausea And Vomiting and Other (See Comments)    Causes the patient to be shaky/unsteady, also  . Vicodin [Hydrocodone-Acetaminophen] Nausea And Vomiting and Other (See Comments)    Causes the patient to be shaky/unsteady, also    Family History  Problem Relation Age of Onset  . Heart disease Father   . Kidney disease Father   . Diabetes Mother   . Seizures Sister   . Seizures Brother     BP 132/80 (BP Location: Left Arm, Patient Position: Sitting, Cuff Size: Normal)   Pulse (!) 117   Ht 5\' 7"  (1.702 m)   Wt 130 lb (59 kg)   SpO2 98%   BMI 20.36 kg/m    Review of Systems She denies hypoglycemia    Objective:   Physical Exam VITAL SIGNS:  See vs page GENERAL: no distress Pulses: dorsalis pedis intact bilat.   MSK: no deformity of the feet CV: no leg edema Skin:  no ulcer on the feet.  normal color and temp on the feet. Neuro: sensation is intact to touch on the feet, but decreased from normal.     Lab Results  Component Value Date   HGBA1C 14.1 (A) 12/20/2019   Lab Results  Component Value Date   TSH 0.395 12/05/2019   T4TOTAL 5.3 10/04/2015       Assessment & Plan:  Type 1 DM, with DR: severe exacerbation.  Noncompliance with cbg recording: she is not a candidate for multiple daily injections  Patient Instructions  Please increase the Lantus to 24 units each morning.  On this type of insulin schedule, you should eat meals on a regular schedule.  If a meal is missed or significantly delayed, your blood sugar could go low.  check your blood sugar twice a day.  vary the time of day when you check, between before the 3 meals, and at bedtime.  also check if you have symptoms of your blood sugar being too high or too low.  please keep a record of the readings  and bring it to your next appointment here (or you can bring the meter itself).  You can write it on any piece of paper.  please call us sooner if your blood sugar goes below 70, or if you have a lot of readings over 200.  Please come back for a  follow-up appointment in 2 months.

## 2019-12-20 NOTE — Patient Instructions (Addendum)
Please increase the Lantus to 24 units each morning.  On this type of insulin schedule, you should eat meals on a regular schedule.  If a meal is missed or significantly delayed, your blood sugar could go low.  check your blood sugar twice a day.  vary the time of day when you check, between before the 3 meals, and at bedtime.  also check if you have symptoms of your blood sugar being too high or too low.  please keep a record of the readings and bring it to your next appointment here (or you can bring the meter itself).  You can write it on any piece of paper.  please call us sooner if your blood sugar goes below 70, or if you have a lot of readings over 200.  Please come back for a follow-up appointment in 2 months.

## 2019-12-20 NOTE — Telephone Encounter (Signed)
Patient had appointment today-this has been resolved

## 2019-12-23 DIAGNOSIS — Z0000 Encounter for general adult medical examination without abnormal findings: Secondary | ICD-10-CM | POA: Diagnosis not present

## 2019-12-23 DIAGNOSIS — F331 Major depressive disorder, recurrent, moderate: Secondary | ICD-10-CM | POA: Diagnosis not present

## 2019-12-23 DIAGNOSIS — D649 Anemia, unspecified: Secondary | ICD-10-CM | POA: Diagnosis not present

## 2019-12-23 DIAGNOSIS — E1140 Type 2 diabetes mellitus with diabetic neuropathy, unspecified: Secondary | ICD-10-CM | POA: Diagnosis not present

## 2019-12-23 DIAGNOSIS — I10 Essential (primary) hypertension: Secondary | ICD-10-CM | POA: Diagnosis not present

## 2019-12-23 DIAGNOSIS — E559 Vitamin D deficiency, unspecified: Secondary | ICD-10-CM | POA: Diagnosis not present

## 2019-12-23 DIAGNOSIS — E785 Hyperlipidemia, unspecified: Secondary | ICD-10-CM | POA: Diagnosis not present

## 2019-12-23 DIAGNOSIS — I5032 Chronic diastolic (congestive) heart failure: Secondary | ICD-10-CM | POA: Diagnosis not present

## 2019-12-23 DIAGNOSIS — Z79899 Other long term (current) drug therapy: Secondary | ICD-10-CM | POA: Diagnosis not present

## 2019-12-23 DIAGNOSIS — Z23 Encounter for immunization: Secondary | ICD-10-CM | POA: Diagnosis not present

## 2020-01-09 ENCOUNTER — Encounter (HOSPITAL_COMMUNITY): Payer: Self-pay | Admitting: Emergency Medicine

## 2020-01-09 ENCOUNTER — Other Ambulatory Visit: Payer: Self-pay

## 2020-01-09 ENCOUNTER — Emergency Department (HOSPITAL_COMMUNITY)
Admission: EM | Admit: 2020-01-09 | Discharge: 2020-01-10 | Disposition: A | Discharge status: Home / Self Care | Payer: Medicare HMO | Attending: Emergency Medicine | Admitting: Emergency Medicine

## 2020-01-09 DIAGNOSIS — Z794 Long term (current) use of insulin: Secondary | ICD-10-CM | POA: Insufficient documentation

## 2020-01-09 DIAGNOSIS — R0789 Other chest pain: Secondary | ICD-10-CM | POA: Insufficient documentation

## 2020-01-09 DIAGNOSIS — N183 Chronic kidney disease, stage 3 unspecified: Secondary | ICD-10-CM | POA: Insufficient documentation

## 2020-01-09 DIAGNOSIS — I129 Hypertensive chronic kidney disease with stage 1 through stage 4 chronic kidney disease, or unspecified chronic kidney disease: Secondary | ICD-10-CM | POA: Insufficient documentation

## 2020-01-09 DIAGNOSIS — Z87891 Personal history of nicotine dependence: Secondary | ICD-10-CM | POA: Insufficient documentation

## 2020-01-09 DIAGNOSIS — I10 Essential (primary) hypertension: Secondary | ICD-10-CM | POA: Diagnosis not present

## 2020-01-09 DIAGNOSIS — Z79899 Other long term (current) drug therapy: Secondary | ICD-10-CM | POA: Insufficient documentation

## 2020-01-09 DIAGNOSIS — Z7982 Long term (current) use of aspirin: Secondary | ICD-10-CM | POA: Insufficient documentation

## 2020-01-09 DIAGNOSIS — E1165 Type 2 diabetes mellitus with hyperglycemia: Secondary | ICD-10-CM | POA: Diagnosis not present

## 2020-01-09 DIAGNOSIS — R739 Hyperglycemia, unspecified: Secondary | ICD-10-CM

## 2020-01-09 LAB — BASIC METABOLIC PANEL
Anion gap: 10 (ref 5–15)
BUN: 35 mg/dL — ABNORMAL HIGH (ref 8–23)
CO2: 24 mmol/L (ref 22–32)
Calcium: 9.1 mg/dL (ref 8.9–10.3)
Chloride: 92 mmol/L — ABNORMAL LOW (ref 98–111)
Creatinine, Ser: 1.57 mg/dL — ABNORMAL HIGH (ref 0.44–1.00)
GFR calc Af Amer: 40 mL/min — ABNORMAL LOW (ref >60)
GFR calc non Af Amer: 35 mL/min — ABNORMAL LOW (ref >60)
Glucose, Bld: 652 mg/dL (ref 70–99)
Potassium: 4.7 mmol/L (ref 3.5–5.1)
Sodium: 126 mmol/L — ABNORMAL LOW (ref 135–145)

## 2020-01-09 LAB — CBC
HCT: 28.2 % — ABNORMAL LOW (ref 36.0–46.0)
Hemoglobin: 9.3 g/dL — ABNORMAL LOW (ref 12.0–15.0)
MCH: 29.7 pg (ref 26.0–34.0)
MCHC: 33 g/dL (ref 30.0–36.0)
MCV: 90.1 fL (ref 80.0–100.0)
Platelets: 279 10*3/uL (ref 150–400)
RBC: 3.13 MIL/uL — ABNORMAL LOW (ref 3.87–5.11)
RDW: 12.3 % (ref 11.5–15.5)
WBC: 5 10*3/uL (ref 4.0–10.5)
nRBC: 0 % (ref 0.0–0.2)

## 2020-01-09 LAB — URINALYSIS, ROUTINE W REFLEX MICROSCOPIC
Bacteria, UA: NONE SEEN
Bilirubin Urine: NEGATIVE
Glucose, UA: >=500 mg/dL — AB
Hgb urine dipstick: NEGATIVE
Ketones, ur: NEGATIVE mg/dL
Nitrite: NEGATIVE
Protein, ur: NEGATIVE mg/dL
Specific Gravity, Urine: 1.017 (ref 1.005–1.030)
pH: 6 (ref 5.0–8.0)

## 2020-01-09 LAB — TROPONIN I (HIGH SENSITIVITY): Troponin I (High Sensitivity): 3 ng/L (ref <18)

## 2020-01-09 NOTE — ED Triage Notes (Signed)
Pt c/o chest pain and hyperglycemia. States her home glucometer read "hi", reports she has been taking medications as prescribed.

## 2020-01-10 DIAGNOSIS — E1165 Type 2 diabetes mellitus with hyperglycemia: Secondary | ICD-10-CM | POA: Diagnosis not present

## 2020-01-10 DIAGNOSIS — R0789 Other chest pain: Secondary | ICD-10-CM | POA: Diagnosis not present

## 2020-01-10 DIAGNOSIS — I10 Essential (primary) hypertension: Secondary | ICD-10-CM | POA: Diagnosis not present

## 2020-01-10 LAB — CBG MONITORING, ED
Glucose-Capillary: 138 mg/dL — ABNORMAL HIGH (ref 70–99)
Glucose-Capillary: 417 mg/dL — ABNORMAL HIGH (ref 70–99)

## 2020-01-10 LAB — TROPONIN I (HIGH SENSITIVITY): Troponin I (High Sensitivity): 3 ng/L (ref <18)

## 2020-01-10 MED ORDER — SODIUM CHLORIDE 0.9 % IV BOLUS
1000.0000 mL | Freq: Once | INTRAVENOUS | Status: AC
  Administered 2020-01-10: 1000 mL via INTRAVENOUS

## 2020-01-10 MED ORDER — INSULIN ASPART 100 UNIT/ML ~~LOC~~ SOLN
10.0000 [IU] | Freq: Once | SUBCUTANEOUS | Status: AC
  Administered 2020-01-10: 10 [IU] via INTRAVENOUS

## 2020-01-10 NOTE — ED Notes (Signed)
Patient denies current chest pain.  States she only has a headache 5/10.  CBG completed

## 2020-01-10 NOTE — Discharge Instructions (Addendum)
Increase your dose of Lantus to 24 units daily.  Keep a record of your blood sugars at home and take this with you to your next doctor's appointment.  Ideally this should be within the next week.  Return to the ER if symptoms significantly worsen or change.

## 2020-01-10 NOTE — ED Provider Notes (Signed)
[facility] EMERGENCY DEPARTMENT Provider Note   CSN: [id number] Arrival date & time: 01/09/20  2019     History Chief Complaint  Patient presents with  . Chest Pain  . Hyperglycemia    Tricia Huynh is a 64 y.o. female.  Patient is a 64 year old female with past medical history of diabetes, hypertension, chronic renal insufficiency.  She presents today for evaluation of elevated blood sugar.  Patient states her glucometer at home read "high".  She does describe increased thirst and urination over the past 2 weeks.  She has recently had her insulin increased, but sugars remain high.  She denies to me any change in her diet or that she is feeling ill.  She denies any fevers, chills, cough, abdominal pain, or diarrhea.  The history is provided by the patient.  Hyperglycemia Blood sugar level PTA:  High Severity:  Moderate Duration:  2 weeks Timing:  Constant Progression:  Worsening Chronicity:  New Diabetes status:  Controlled with insulin Context: not recent change in diet and not recent illness   Relieved by:  Nothing Ineffective treatments:  None tried Associated symptoms: increased thirst and polyuria        Past Medical History:  Diagnosis Date  . Diabetes mellitus type 2 in nonobese ([place]) 06/06/2015  . Diabetes mellitus without complication ([place])   . Diabetic neuropathy ([place]) 06/06/2015  . Dyslipidemia 06/06/2015  . Encephalopathy   . Essential hypertension 06/06/2015  . Hypertension   . Mood disorder ([place]) 06/06/2015  . Pneumonia 03/11/2017  . Seizure [facility])    sees [name]. abd eeg, on keppra    Patient Active Problem List   Diagnosis Date Noted  . HCAP (healthcare-associated pneumonia) 03/11/2017  . Dyslipidemia 03/11/2017  . Diabetes mellitus with complication ([place])   . Chest pain 12/30/2016  . Seizure disorder ([place]) 12/30/2016  . Diabetic ketoacidosis without coma associated with type 2 diabetes mellitus ([place])   . DKA (diabetic  ketoacidoses) ([place]) 11/15/2016  . UTI (urinary tract infection) 11/15/2016  . Acute cystitis without hematuria   . Acute renal failure ([place])   . Hyperglycemia 11/14/2016  . Diabetes ([place]) 06/05/2016  . Altered mental status   . CKD (chronic kidney disease) stage 3, GFR 30-59 ml/min 05/26/2016  . Benign essential HTN 05/26/2016  . Seizures ([place]) 01/08/2016  . Memory loss 10/04/2015  . Acute encephalopathy 06/06/2015  . Diabetic neuropathy ([place]) 06/06/2015    Past Surgical History:  Procedure Laterality Date  . ABDOMINAL HYSTERECTOMY    . EYE SURGERY Bilateral   . TUBAL LIGATION       OB History   No obstetric history on file.     Family History  Problem Relation Age of Onset  . Heart disease Father   . Kidney disease Father   . Diabetes Mother   . Seizures Sister   . Seizures Brother     Social History   Tobacco Use  . Smoking status: Former Smoker    Types: Cigarettes  . Smokeless tobacco: Never Used  Substance Use Topics  . Alcohol use: No  . Drug use: No    Home Medications Prior to Admission medications   Medication Sig Start Date End Date Taking? Authorizing Provider  Accu-Chek Softclix Lancets lancets 1 each by Other route 2 (two) times daily. E11.9 12/20/19   Renato Shin, MD  acetaminophen (TYLENOL) 500 MG tablet Take 1,000 mg by mouth every 6 (six) hours as needed for mild pain or headache.  [provider]  amitriptyline (ELAVIL) 50 MG tablet Take 50 mg by mouth at bedtime. 05/08/15   [provider]  amLODipine-olmesartan (AZOR) 5-40 MG tablet Take 1 tablet by mouth daily. 10/08/16   [provider]  aspirin EC 81 MG tablet Take 81 mg by mouth daily.    [provider]  Blood Glucose Monitoring Suppl (ACCU-CHEK AVIVA PLUS) w/Device KIT 1 each by Does not apply route 2 (two) times daily. E11.9 12/20/19   Renato Shin, MD  glucose blood (ACCU-CHEK AVIVA PLUS) test strip 1 each by Other route 2 (two) times daily. E11.9  12/20/19   Renato Shin, MD  hydrochlorothiazide (HYDRODIURIL) 25 MG tablet Take 25 mg by mouth daily. 01/13/19   [provider]  Insulin Glargine (LANTUS SOLOSTAR) 100 UNIT/ML Solostar Pen Inject 24 Units into the skin every morning. And pen needles 1/day 12/20/19   Renato Shin, MD  levETIRAcetam (KEPPRA) 750 MG tablet Take one tablet by mouth twice daily.  Please call [phone] to schedule yearly follow up appt. 08/04/19   Marcial Pacas, MD  metoprolol succinate (TOPROL-XL) 50 MG 24 hr tablet Take 50 mg by mouth at bedtime.  05/08/15   [provider]  ondansetron (ZOFRAN ODT) 4 MG disintegrating tablet Take 1 tablet (4 mg total) by mouth every 8 (eight) hours as needed for nausea or vomiting. 11/02/19   Volanda Napoleon, PA-C  sertraline (ZOLOFT) 50 MG tablet Take 50 mg by mouth daily. 01/07/19   [provider]  vitamin E 400 UNIT capsule Take 400 Units by mouth daily.    [provider]    Allergies    Percocet [oxycodone-acetaminophen] and Vicodin [hydrocodone-acetaminophen]  Review of Systems   Review of Systems  Endocrine: Positive for polydipsia and polyuria.  All other systems reviewed and are negative.   Physical Exam Updated Vital Signs BP (!) 153/103 (BP Location: Right Arm)   Pulse 93   Temp 98.3 F (36.8 C) (Oral)   Resp 18   SpO2 100%   Physical Exam Vitals and nursing note reviewed.  Constitutional:      General: She is not in acute distress.    Appearance: She is well-developed. She is not diaphoretic.  HENT:     Head: Normocephalic and atraumatic.  Cardiovascular:     Rate and Rhythm: Normal rate and regular rhythm.     Heart sounds: No murmur. No friction rub. No gallop.   Pulmonary:     Effort: Pulmonary effort is normal. No respiratory distress.     Breath sounds: Normal breath sounds. No wheezing.  Abdominal:     General: Bowel sounds are normal. There is no distension.     Palpations: Abdomen is soft.     Tenderness:  There is no abdominal tenderness.  Musculoskeletal:        General: Normal range of motion.     Cervical back: Normal range of motion and neck supple.  Skin:    General: Skin is warm and dry.  Neurological:     Mental Status: She is alert and oriented to person, place, and time.     ED Results / Procedures / Treatments   Labs (all labs ordered are listed, but only abnormal results are displayed) Labs Reviewed  BASIC METABOLIC PANEL - Abnormal; Notable for the following components:      Result Value   Sodium 126 (*)    Chloride 92 (*)    Glucose, Bld 652 (*)    BUN  35 (*)    Creatinine, Ser 1.57 (*)    GFR calc non Af Amer 35 (*)    GFR calc Af Amer 40 (*)    All other components within normal limits  CBC - Abnormal; Notable for the following components:   RBC 3.13 (*)    Hemoglobin 9.3 (*)    HCT 28.2 (*)    All other components within normal limits  URINALYSIS, ROUTINE W REFLEX MICROSCOPIC - Abnormal; Notable for the following components:   Color, Urine STRAW (*)    Glucose, UA >=500 (*)    Leukocytes,Ua LARGE (*)    All other components within normal limits  CBG MONITORING, ED  CBG MONITORING, ED  TROPONIN I (HIGH SENSITIVITY)  TROPONIN I (HIGH SENSITIVITY)    EKG EKG Interpretation  Date/Time:  Tuesday January 10 2020 00:06:41 EST Ventricular Rate:  88 PR Interval:    QRS Duration: 94 QT Interval:  375 QTC Calculation: 454 R Axis:   -23 Text Interpretation: Sinus rhythm Borderline left axis deviation Abnormal R-wave progression, early transition No significant change since 12/04/2019 Confirmed by Veryl Speak [phone]) on 01/10/2020 12:10:12 AM   Radiology No results found.  Procedures Procedures (including critical care time)  Medications Ordered in ED Medications  sodium chloride 0.9 % bolus 1,000 mL (has no administration in time range)  insulin aspart (novoLOG) injection 10 Units (has no administration in time range)    ED Course  I have  reviewed the triage vital signs and the nursing notes.  Pertinent labs & imaging results that were available during my care of the patient were reviewed by me and considered in my medical decision making (see chart for details).    MDM Rules/Calculators/A&P  Patient presenting here with elevated blood sugar.  Her initial sugar is 652 without signs of DKA.  Patient given normal saline and NovoLog and blood sugar is now 138.  Patient feeling better and I believe is appropriate for discharge.  She recently had her dose of Lantus decreased from 24 to 23 units and I have advised her to increase to her previous dose.  She is to follow-up in the next week with her primary doctor and keep a record of her blood sugars in the meantime.  Final Clinical Impression(s) / ED Diagnoses Final diagnoses:  None    Rx / DC Orders ED Discharge Orders    None       Veryl Speak, MD 01/10/20 [phone]

## 2020-01-29 DIAGNOSIS — Z87891 Personal history of nicotine dependence: Secondary | ICD-10-CM | POA: Diagnosis not present

## 2020-01-29 DIAGNOSIS — M25062 Hemarthrosis, left knee: Secondary | ICD-10-CM | POA: Diagnosis not present

## 2020-01-29 DIAGNOSIS — Z885 Allergy status to narcotic agent status: Secondary | ICD-10-CM | POA: Diagnosis not present

## 2020-01-29 DIAGNOSIS — M2392 Unspecified internal derangement of left knee: Secondary | ICD-10-CM | POA: Diagnosis not present

## 2020-01-29 DIAGNOSIS — I10 Essential (primary) hypertension: Secondary | ICD-10-CM | POA: Diagnosis not present

## 2020-01-29 DIAGNOSIS — Z90710 Acquired absence of both cervix and uterus: Secondary | ICD-10-CM | POA: Diagnosis not present

## 2020-01-29 DIAGNOSIS — E119 Type 2 diabetes mellitus without complications: Secondary | ICD-10-CM | POA: Diagnosis not present

## 2020-02-08 DIAGNOSIS — W19XXXA Unspecified fall, initial encounter: Secondary | ICD-10-CM | POA: Diagnosis not present

## 2020-02-08 DIAGNOSIS — M25062 Hemarthrosis, left knee: Secondary | ICD-10-CM | POA: Diagnosis not present

## 2020-02-08 DIAGNOSIS — M069 Rheumatoid arthritis, unspecified: Secondary | ICD-10-CM | POA: Diagnosis not present

## 2020-02-10 ENCOUNTER — Ambulatory Visit: Payer: Medicare HMO

## 2020-02-13 DIAGNOSIS — M25562 Pain in left knee: Secondary | ICD-10-CM | POA: Diagnosis not present

## 2020-02-16 ENCOUNTER — Emergency Department (HOSPITAL_COMMUNITY)
Admission: EM | Admit: 2020-02-16 | Discharge: 2020-02-17 | Disposition: A | Discharge status: Home / Self Care | Payer: Medicare HMO | Attending: Emergency Medicine | Admitting: Emergency Medicine

## 2020-02-16 ENCOUNTER — Other Ambulatory Visit: Payer: Self-pay

## 2020-02-16 DIAGNOSIS — N3000 Acute cystitis without hematuria: Secondary | ICD-10-CM | POA: Diagnosis not present

## 2020-02-16 DIAGNOSIS — E1122 Type 2 diabetes mellitus with diabetic chronic kidney disease: Secondary | ICD-10-CM | POA: Diagnosis not present

## 2020-02-16 DIAGNOSIS — N183 Chronic kidney disease, stage 3 unspecified: Secondary | ICD-10-CM | POA: Insufficient documentation

## 2020-02-16 DIAGNOSIS — I129 Hypertensive chronic kidney disease with stage 1 through stage 4 chronic kidney disease, or unspecified chronic kidney disease: Secondary | ICD-10-CM | POA: Insufficient documentation

## 2020-02-16 DIAGNOSIS — Z20822 Contact with and (suspected) exposure to covid-19: Secondary | ICD-10-CM | POA: Insufficient documentation

## 2020-02-16 DIAGNOSIS — R6883 Chills (without fever): Secondary | ICD-10-CM | POA: Diagnosis not present

## 2020-02-16 DIAGNOSIS — B999 Unspecified infectious disease: Secondary | ICD-10-CM | POA: Diagnosis not present

## 2020-02-16 DIAGNOSIS — Z87891 Personal history of nicotine dependence: Secondary | ICD-10-CM | POA: Diagnosis not present

## 2020-02-16 LAB — CBC
HCT: 30.5 % — ABNORMAL LOW (ref 36.0–46.0)
Hemoglobin: 9.5 g/dL — ABNORMAL LOW (ref 12.0–15.0)
MCH: 29.1 pg (ref 26.0–34.0)
MCHC: 31.1 g/dL (ref 30.0–36.0)
MCV: 93.3 fL (ref 80.0–100.0)
Platelets: 307 10*3/uL (ref 150–400)
RBC: 3.27 MIL/uL — ABNORMAL LOW (ref 3.87–5.11)
RDW: 12.8 % (ref 11.5–15.5)
WBC: 5.2 10*3/uL (ref 4.0–10.5)
nRBC: 0 % (ref 0.0–0.2)

## 2020-02-16 LAB — BASIC METABOLIC PANEL
Anion gap: 15 (ref 5–15)
BUN: 14 mg/dL (ref 8–23)
CO2: 24 mmol/L (ref 22–32)
Calcium: 9.6 mg/dL (ref 8.9–10.3)
Chloride: 101 mmol/L (ref 98–111)
Creatinine, Ser: 1.3 mg/dL — ABNORMAL HIGH (ref 0.44–1.00)
GFR calc Af Amer: 51 mL/min — ABNORMAL LOW (ref >60)
GFR calc non Af Amer: 44 mL/min — ABNORMAL LOW (ref >60)
Glucose, Bld: 178 mg/dL — ABNORMAL HIGH (ref 70–99)
Potassium: 3.8 mmol/L (ref 3.5–5.1)
Sodium: 140 mmol/L (ref 135–145)

## 2020-02-16 NOTE — ED Triage Notes (Signed)
Pt reports chills, body aches X few days. Also reports "bumps" on her back. Few scabs noted to upper back. No rash or blistering

## 2020-02-17 ENCOUNTER — Emergency Department (HOSPITAL_COMMUNITY): Payer: Medicare HMO

## 2020-02-17 DIAGNOSIS — R6883 Chills (without fever): Secondary | ICD-10-CM | POA: Diagnosis not present

## 2020-02-17 DIAGNOSIS — B999 Unspecified infectious disease: Secondary | ICD-10-CM | POA: Diagnosis not present

## 2020-02-17 LAB — URINALYSIS, ROUTINE W REFLEX MICROSCOPIC
Bilirubin Urine: NEGATIVE
Glucose, UA: NEGATIVE mg/dL
Hgb urine dipstick: NEGATIVE
Ketones, ur: 5 mg/dL — AB
Nitrite: NEGATIVE
Protein, ur: 100 mg/dL — AB
Specific Gravity, Urine: 1.02 (ref 1.005–1.030)
pH: 5 (ref 5.0–8.0)

## 2020-02-17 LAB — SARS CORONAVIRUS 2 (TAT 6-24 HRS): SARS Coronavirus 2: NEGATIVE

## 2020-02-17 MED ORDER — CEPHALEXIN 500 MG PO CAPS: 500.0000 mg | ORAL_CAPSULE | Freq: Four times a day (QID) | ORAL | 0 refills | Status: DC

## 2020-02-17 MED ORDER — TRAMADOL HCL 50 MG PO TABS
50.0000 mg | ORAL_TABLET | Freq: Once | ORAL | Status: AC
  Administered 2020-02-17: 50 mg via ORAL
  Filled 2020-02-17: qty 1

## 2020-02-17 MED ORDER — CEPHALEXIN 250 MG PO CAPS
1000.0000 mg | ORAL_CAPSULE | Freq: Once | ORAL | Status: AC
  Administered 2020-02-17: 1000 mg via ORAL
  Filled 2020-02-17: qty 4

## 2020-02-17 MED ORDER — LACTATED RINGERS IV BOLUS
1000.0000 mL | Freq: Once | INTRAVENOUS | Status: AC
  Administered 2020-02-17: 1000 mL via INTRAVENOUS

## 2020-02-17 NOTE — ED Notes (Signed)
Pt was discharged from the ED. Pt read and understood discharge paperwork. Pt had vital signs completed. Pt conscious, breathing, and A&Ox4. No distress noted. Pt speaking in complete sentences. Pt ambulated out of the ED with a smooth and steady gait. E-signature not available.  

## 2020-02-17 NOTE — ED Provider Notes (Signed)
Emergency Department Provider Note   I have reviewed the triage vital signs and the nursing notes.   HISTORY  Chief Complaint Chills   HPI Tricia Huynh is a 64 y.o. female who presents to the emerge department today with chills.  Patient also notes rhinorrhea.  She states her granddaughter is ill with something similar.  This patient's been symptomatic since Sunday.  She states that not have any real chest pain but does have decreased appetite and increased fatigue has been sleeping more than normal.  She also has decreased urine output secondary to decreased intake.  Patient states that all she had the last 24 hours was a small can of chicken noodle soup.  She denies any constipation or diarrhea.  She does have some itching on her upper back and in her mid back that is new.  No itching elsewhere.  No rashes elsewhere.  She does have a mild headache but no neck pain or nuchal rigidity.  Has not taken thing for symptoms prior to arrival here.  Has not checked her temperature at home.   No other associated or modifying symptoms.    Past Medical History:  Diagnosis Date  . Diabetes mellitus type 2 in nonobese ([place]) 06/06/2015  . Diabetes mellitus without complication ([place])   . Diabetic neuropathy ([place]) 06/06/2015  . Dyslipidemia 06/06/2015  . Encephalopathy   . Essential hypertension 06/06/2015  . Hypertension   . Mood disorder ([place]) 06/06/2015  . Pneumonia 03/11/2017  . Seizure [facility])    sees [name]. abd eeg, on keppra    Patient Active Problem List   Diagnosis Date Noted  . HCAP (healthcare-associated pneumonia) 03/11/2017  . Dyslipidemia 03/11/2017  . Diabetes mellitus with complication ([place])   . Chest pain 12/30/2016  . Seizure disorder ([place]) 12/30/2016  . Diabetic ketoacidosis without coma associated with type 2 diabetes mellitus ([place])   . DKA (diabetic ketoacidoses) ([place]) 11/15/2016  . UTI (urinary tract infection) 11/15/2016  . Acute cystitis without hematuria   .  Acute renal failure ([place])   . Hyperglycemia 11/14/2016  . Diabetes ([place]) 06/05/2016  . Altered mental status   . CKD (chronic kidney disease) stage 3, GFR 30-59 ml/min 05/26/2016  . Benign essential HTN 05/26/2016  . Seizures ([place]) 01/08/2016  . Memory loss 10/04/2015  . Acute encephalopathy 06/06/2015  . Diabetic neuropathy ([place]) 06/06/2015    Past Surgical History:  Procedure Laterality Date  . ABDOMINAL HYSTERECTOMY    . EYE SURGERY Bilateral   . TUBAL LIGATION      Current Outpatient Rx  . Order #: [id number] Class: Historical Med  . Order #: [id number] Class: Historical Med  . Order #: [id number] Class: Historical Med  . Order #: [id number] Class: Historical Med  . Order #: [id number] Class: Historical Med  . Order #: [id number] Class: Historical Med  . Order #: [id number] Class: Historical Med  . Order #: [id number] Class: Normal  . Order #: [id number] Class: Historical Med  . Order #: [id number] Class: Historical Med  . Order #: [id number] Class: Historical Med  . Order #: [id number] Class: Normal  . Order #: [id number] Class: Normal  . Order #: [id number] Class: Normal  . Order #: [id number] Class: Normal    Allergies Percocet [oxycodone-acetaminophen] and Vicodin [hydrocodone-acetaminophen]  Family History  Problem Relation Age of Onset  . Heart disease Father   . Kidney disease Father   . Diabetes Mother   . Seizures Sister   . Seizures Brother     Social History Social History   Tobacco Use  . Smoking status:  Former Smoker    Types: Cigarettes  . Smokeless tobacco: Never Used  Substance Use Topics  . Alcohol use: No  . Drug use: No    Review of Systems  All other systems negative except as documented in the HPI. All pertinent positives and negatives as reviewed in the HPI. ____________________________________________   PHYSICAL EXAM:  VITAL SIGNS: ED Triage Vitals [02/16/20 2000]  Enc Vitals Group     BP (!) 171/105     Pulse Rate (!) 110     Resp 17     Temp  98.5 F (36.9 C)     Temp Source Oral     SpO2 98 %     Weight 131 lb (59.4 kg)     Height 5\' 7"  (1.702 m)    Constitutional: Alert and oriented. Well appearing and in no acute distress. Eyes: Conjunctivae are normal. PERRL. EOMI. Head: Atraumatic. Nose: No congestion/rhinnorhea. Mouth/Throat: Mucous membranes are moist.  Oropharynx non-erythematous. Neck: No stridor.  No meningeal signs.   Cardiovascular: Normal rate, regular rhythm. Good peripheral circulation. Grossly normal heart sounds.   Respiratory: Normal respiratory effort.  No retractions. Lungs CTAB. Gastrointestinal: Soft and nontender. No distention.  Musculoskeletal: No lower extremity tenderness nor edema. No gross deformities of extremities. Neurologic:  Normal speech and language. No gross focal neurologic deficits are appreciated.  Skin:  Skin is warm, dry and intact. No rash noted.   ____________________________________________   LABS (all labs ordered are listed, but only abnormal results are displayed)  Labs Reviewed  CBC - Abnormal; Notable for the following components:      Result Value   RBC 3.27 (*)    Hemoglobin 9.5 (*)    HCT 30.5 (*)    All other components within normal limits  BASIC METABOLIC PANEL - Abnormal; Notable for the following components:   Glucose, Bld 178 (*)    Creatinine, Ser 1.30 (*)    GFR calc non Af Amer 44 (*)    GFR calc Af Amer 51 (*)    All other components within normal limits  URINALYSIS, ROUTINE W REFLEX MICROSCOPIC - Abnormal; Notable for the following components:   APPearance HAZY (*)    Ketones, ur 5 (*)    Protein, ur 100 (*)    Leukocytes,Ua LARGE (*)    Bacteria, UA RARE (*)    All other components within normal limits  SARS CORONAVIRUS 2 (TAT 6-24 HRS)  URINE CULTURE   ____________________________________________  EKG   EKG Interpretation  Date/Time:    Ventricular Rate:    PR Interval:    QRS Duration:   QT Interval:    QTC Calculation:   R  Axis:     Text Interpretation:         ____________________________________________  RADIOLOGY  DG Chest 2 View  Result Date: 02/17/2020 CLINICAL DATA:  Infection EXAM: CHEST - 2 VIEW COMPARISON:  12/04/2019 FINDINGS: Lungs are clear.  No pleural effusion or pneumothorax. The heart is normal in size. Visualized osseous structures are within normal limits. IMPRESSION: Normal chest radiographs. Electronically Signed   By: Julian Hy M.D.   On: 02/17/2020 00:24    ____________________________________________   PROCEDURES  Procedure(s) performed:   Procedures   ____________________________________________   INITIAL IMPRESSION / ASSESSMENT AND PLAN / ED COURSE  Chills of unclear etiology but does appear to have urinary tract infection which may be related. Covid test pending at time of discharge. Patient started on antibiotics. Feels better with some fluids  that she was slightly dehydrated heart rate improved with the same. Plan to fu w/ pcp for reeval in 2-3 days.   Pertinent labs & imaging results that were available during my care of the patient were reviewed by me and considered in my medical decision making (see chart for details).  A medical screening exam was performed and I feel the patient has had an appropriate workup for their chief complaint at this time and likelihood of emergent condition existing is low. They have been counseled on decision, discharge, follow up and which symptoms necessitate immediate return to the emergency department. They or their family verbally stated understanding and agreement with plan and discharged in stable condition.   ____________________________________________  FINAL CLINICAL IMPRESSION(S) / ED DIAGNOSES  Final diagnoses:  Chills  Acute cystitis without hematuria    MEDICATIONS GIVEN DURING THIS VISIT:  Medications  lactated ringers bolus 1,000 mL (0 mLs Intravenous Stopped 02/17/20 0150)  cephALEXin (KEFLEX) capsule 1,000  mg (1,000 mg Oral Given 02/17/20 0421)  traMADol (ULTRAM) tablet 50 mg (50 mg Oral Given 02/17/20 0433)    NEW OUTPATIENT MEDICATIONS STARTED DURING THIS VISIT:  Discharge Medication List as of 02/17/2020  4:24 AM    START taking these medications   Details  cephALEXin (KEFLEX) 500 MG capsule Take 1 capsule (500 mg total) by mouth 4 (four) times daily., Starting Fri 02/17/2020, Normal        Note:  This note was prepared with assistance of Dragon voice recognition software. Occasional wrong-word or sound-a-like substitutions may have occurred due to the inherent limitations of voice recognition software.   Deveon Kisiel, Corene Cornea, MD 02/17/20 [phone]

## 2020-02-17 NOTE — ED Notes (Signed)
Sent a urine culture with the urine specimen 

## 2020-02-19 LAB — URINE CULTURE: Culture: NO GROWTH

## 2020-02-20 ENCOUNTER — Ambulatory Visit: Payer: Medicare HMO | Admitting: Endocrinology

## 2020-02-27 DIAGNOSIS — M25562 Pain in left knee: Secondary | ICD-10-CM | POA: Diagnosis not present

## 2020-03-02 DIAGNOSIS — M25562 Pain in left knee: Secondary | ICD-10-CM | POA: Diagnosis not present

## 2020-03-12 DIAGNOSIS — M25562 Pain in left knee: Secondary | ICD-10-CM | POA: Diagnosis not present

## 2020-03-28 ENCOUNTER — Encounter: Payer: Self-pay | Admitting: Physical Therapy

## 2020-03-28 ENCOUNTER — Ambulatory Visit: Payer: Medicare HMO | Attending: Orthopedic Surgery | Admitting: Physical Therapy

## 2020-03-28 ENCOUNTER — Other Ambulatory Visit: Payer: Self-pay

## 2020-03-28 DIAGNOSIS — M25662 Stiffness of left knee, not elsewhere classified: Secondary | ICD-10-CM | POA: Insufficient documentation

## 2020-03-28 DIAGNOSIS — R600 Localized edema: Secondary | ICD-10-CM | POA: Diagnosis not present

## 2020-03-28 DIAGNOSIS — M25562 Pain in left knee: Secondary | ICD-10-CM | POA: Diagnosis not present

## 2020-03-28 DIAGNOSIS — R262 Difficulty in walking, not elsewhere classified: Secondary | ICD-10-CM | POA: Insufficient documentation

## 2020-03-28 NOTE — Therapy (Signed)
[place], [place], [address] Phone: [phone]   Fax:  [phone]  Physical Therapy Evaluation  Patient Details  Name: Tricia Huynh MRN: [id number] Date of Birth: [birth year] Referring Provider (PT): Edmonia Lynch, MD   Encounter Date: 03/28/2020  PT End of Session - 03/28/20 2028    Visit Number  1    Number of Visits  12    Date for PT Re-Evaluation  05/16/20    Authorization Type  [employer] [place] authorization for 12 visits    PT Start Time  0240    PT Stop Time  1629    PT Time Calculation (min)  44 min    Activity Tolerance  Patient tolerated treatment well    Behavior During Therapy  [facility] for tasks assessed/performed       Past Medical History:  Diagnosis Date  . Diabetes mellitus type 2 in nonobese ([place]) 06/06/2015  . Diabetes mellitus without complication ([place])   . Diabetic neuropathy ([place]) 06/06/2015  . Dyslipidemia 06/06/2015  . Encephalopathy   . Essential hypertension 06/06/2015  . Hypertension   . Mood disorder ([place]) 06/06/2015  . Pneumonia 03/11/2017  . Seizure [facility])    sees [name]. abd eeg, on keppra    Past Surgical History:  Procedure Laterality Date  . ABDOMINAL HYSTERECTOMY    . EYE SURGERY Bilateral   . TUBAL LIGATION      There were no vitals filed for this visit.   Subjective Assessment - 03/28/20 1552    Subjective  Pt. is a 64 y/o female referred to PT for c/o left knee pain secondary to fall injury which occured 01/28/20 in which she slipped on steps while visiting relative out-of-town and landed with impact to knee. She went to ED the next day and had X-rays taken which were (-) for fracture. She followed up with orthopedist with current diagnosis of left patellar contusion. She initially used crutches s/p injury but currently is ambulating independently without AD but with continued issues with anterior knee pain and swelling.    Pertinent History   diabetic, history of seizures (no seizures in past several years)    Limitations  House hold activities;Lifting;Standing;Walking    Diagnostic tests  X-rays    Patient Stated Goals  Get knee better    Currently in Pain?  Yes    Pain Score  3     Pain Location  Knee    Pain Orientation  Left    Pain Descriptors / Indicators  Sharp    Pain Type  Acute pain    Pain Onset  More than a month ago    Pain Frequency  Intermittent    Aggravating Factors   walking, bending knee    Pain Relieving Factors  better wih rest and avoiding knee flexion    Effect of Pain on Daily Activities  limits walking tolerance and ability to bend knee for ADLs         [facility] PT Assessment - 03/28/20 0001      Assessment   Medical Diagnosis  Left patella contusion    Referring Provider (PT)  Edmonia Lynch, MD    Onset Date/Surgical Date  01/28/20    Hand Dominance  Right    Prior Therapy  none      Precautions   Precautions  None      Restrictions   Weight Bearing Restrictions  No      Balance Screen   Has  the patient fallen in the past 6 months  Yes    How many times?  1      [place] residence    Living Arrangements  Other relatives   lives with grandchild   Type of Home  Apartment    Home Access  Level entry    Home Layout  One level      Prior Function   Level of Independence  Independent with community mobility without device      Cognition   Overall Cognitive Status  Within Functional Limits for tasks assessed      Observation/Other Assessments   Observations  --   edema left anterior knee/peripatellar region   Focus on Therapeutic Outcomes (FOTO)   37% limited      Observation/Other Assessments-Edema    Edema  Circumferential      Circumferential Edema   Circumferential - Right  33.5 cm    Circumferential - Left   35 cm      Sensation   Light Touch  Appears Intact      ROM / Strength   AROM / PROM / Strength  AROM;PROM;Strength       AROM   AROM Assessment Site  Knee    Right/Left Knee  Right;Left    Right Knee Extension  0    Right Knee Flexion  145    Left Knee Extension  0    Left Knee Flexion  110      PROM   PROM Assessment Site  Knee    Right/Left Knee  Left    Left Knee Extension  0    Left Knee Flexion  130      Strength   Strength Assessment Site  Hip;Knee    Right/Left Hip  Right;Left    Right Hip Flexion  4+/5    Right Hip Extension  4+/5    Right Hip External Rotation   5/5    Right Hip Internal Rotation  5/5    Right Hip ABduction  4/5    Right Hip ADduction  4+/5    Left Hip Flexion  4-/5    Left Hip Extension  4-/5    Left Hip External Rotation  4-/5    Left Hip Internal Rotation  4-/5    Left Hip ABduction  4-/5    Left Hip ADduction  4-/5    Right/Left Knee  Right;Left    Right Knee Flexion  4+/5    Right Knee Extension  5/5    Left Knee Flexion  4-/5    Left Knee Extension  4-/5      Palpation   Palpation comment  tender to palpation left patella      Special Tests   Other special tests  (-) anterior/posterior drawer, (-) varus/valgus stresss tests, (-) McMurray's                  Objective measurements completed on examination: See above findings.      [facility] Adult PT Treatment/Exercise - 03/28/20 0001      Exercises   Exercises  --   HEP handout review-see chart copy            PT Education - 03/28/20 2028    Education Details  symptom etiology, HEP, POC    Person(s) Educated  Patient    Methods  Explanation;Demonstration;Verbal cues;Handout    Comprehension  Verbalized understanding       PT Short  Term Goals - 03/28/20 2037      PT SHORT TERM GOAL #1   Title  Independent with initial HEP    Baseline  needs HEP-instructed at eval    Time  3    Period  Weeks    Status  New      PT SHORT TERM GOAL #2   Title  Perform SLR without quad lag for improved quad activation for progression toward LTG for strength    Baseline  difficulty/limited  left quad activation    Time  6    Period  Weeks    Status  New        PT Long Term Goals - 03/28/20 2034      PT LONG TERM GOAL #1   Title  Independent with advanced HEP for progression of knee strengthenig after d/c from therapy    Baseline  needs HEP    Time  6    Period  Weeks    Status  New    Target Date  05/16/20      PT LONG TERM GOAL #2   Title  Improve FOTO outcome measure score to 29% or less impairment    Baseline  37% limited    Time  6    Period  Weeks    Status  New    Target Date  05/16/20      PT LONG TERM GOAL #3   Title  Increase left knee strength to 4+/5 or greater to improve knee stability for stair navigation    Baseline  4-/5 for both extension and flexion    Time  6    Period  Weeks    Status  New    Target Date  05/16/20      PT LONG TERM GOAL #4   Title  Increase left knee flexion AROM 10 deg or greater to improve ability for descending stairs, bending knee to donn shoes    Baseline  110 deg    Time  6    Period  Weeks    Status  New    Target Date  05/16/20      PT LONG TERM GOAL #5   Title  Perform community level ambulation and tolerate IADLs with left knee pain decreased 75% or greater from baseline status    Baseline  decreased tolerance due to knee pain    Time  6    Period  Weeks    Status  New    Target Date  05/16/20             Plan - 03/28/20 2029    Clinical Impression Statement  Pt. presents with left anterior knee/patellar region pain and edema s/p contusion injury secondary to fall with associated knee stiffness, pain and muscle weakness. Pt. would benefit from PT to help relieve pain and improve functional status for mobility.    Personal Factors and Comorbidities  Comorbidity 1    Comorbidities  diabetic    Examination-Activity Limitations  Stairs;Stand;Locomotion Level;Squat;Lift;Transfers    Examination-Participation Restrictions  Community Activity;Shop;Cleaning    Stability/Clinical Decision Making   Stable/Uncomplicated    Clinical Decision Making  Low    Rehab Potential  Good    PT Frequency  2x / week    PT Duration  6 weeks    PT Treatment/Interventions  ADLs/Self Care Home Management;Cryotherapy;Ultrasound;Electrical Stimulation;Iontophoresis 4mg /ml Dexamethasone;Moist Heat;Therapeutic activities;Functional mobility training;Stair training;Gait training;Therapeutic exercise;Balance training;Neuromuscular re-education;Patient/family education;Manual techniques;Passive range of motion;Dry needling;Vasopneumatic Device;Taping  PT Next Visit Plan  review HEP as needed, consider [place] vs. ionto anterior knee/patella, pending tolerate possible warm up on NUSTEP, quad activation with quad sets, SAQ, SLR as able, knee ROM, standing exercises as tolerated, pending ability SLR potential consideration [place] estim to assist quad activation    PT Home Exercise Plan  [id number]: heel slides with strap assist for knee flexion, ankle pumps with legs elevated for edema, LAQ, quad sets    Consulted and Agree with Plan of Care  Patient       Patient will benefit from skilled therapeutic intervention in order to improve the following deficits and impairments:  Pain, Decreased strength, Decreased activity tolerance, Increased edema, Decreased range of motion, Difficulty walking  Visit Diagnosis: Acute pain of left knee  Stiffness of left knee, not elsewhere classified  Localized edema  Difficulty in walking, not elsewhere classified     Problem List Patient Active Problem List   Diagnosis Date Noted  . HCAP (healthcare-associated pneumonia) 03/11/2017  . Dyslipidemia 03/11/2017  . Diabetes mellitus with complication ([place])   . Chest pain 12/30/2016  . Seizure disorder ([place]) 12/30/2016  . Diabetic ketoacidosis without coma associated with type 2 diabetes mellitus ([place])   . DKA (diabetic ketoacidoses) ([place]) 11/15/2016  . UTI (urinary tract infection) 11/15/2016  . Acute cystitis without  hematuria   . Acute renal failure ([place])   . Hyperglycemia 11/14/2016  . Diabetes ([place]) 06/05/2016  . Altered mental status   . CKD (chronic kidney disease) stage 3, GFR 30-59 ml/min 05/26/2016  . Benign essential HTN 05/26/2016  . Seizures ([place]) 01/08/2016  . Memory loss 10/04/2015  . Acute encephalopathy 06/06/2015  . Diabetic neuropathy ([place]) 06/06/2015   Beaulah Dinning, PT, DPT 03/28/20 8:44 PM  [facility] [address] [place], [place], [address] Phone: [phone]   Fax:  [phone]  Name: Tateanna Bach MRN: [id number] Date of Birth: 04/08/1956

## 2020-04-01 IMAGING — CR DG CHEST 2V
2 series · 2 of 2 positions shown · non-contrast
Comparison: 01/07/2019

CLINICAL DATA: Weakness

EXAM:
CHEST - 2 VIEW

[chest pa]
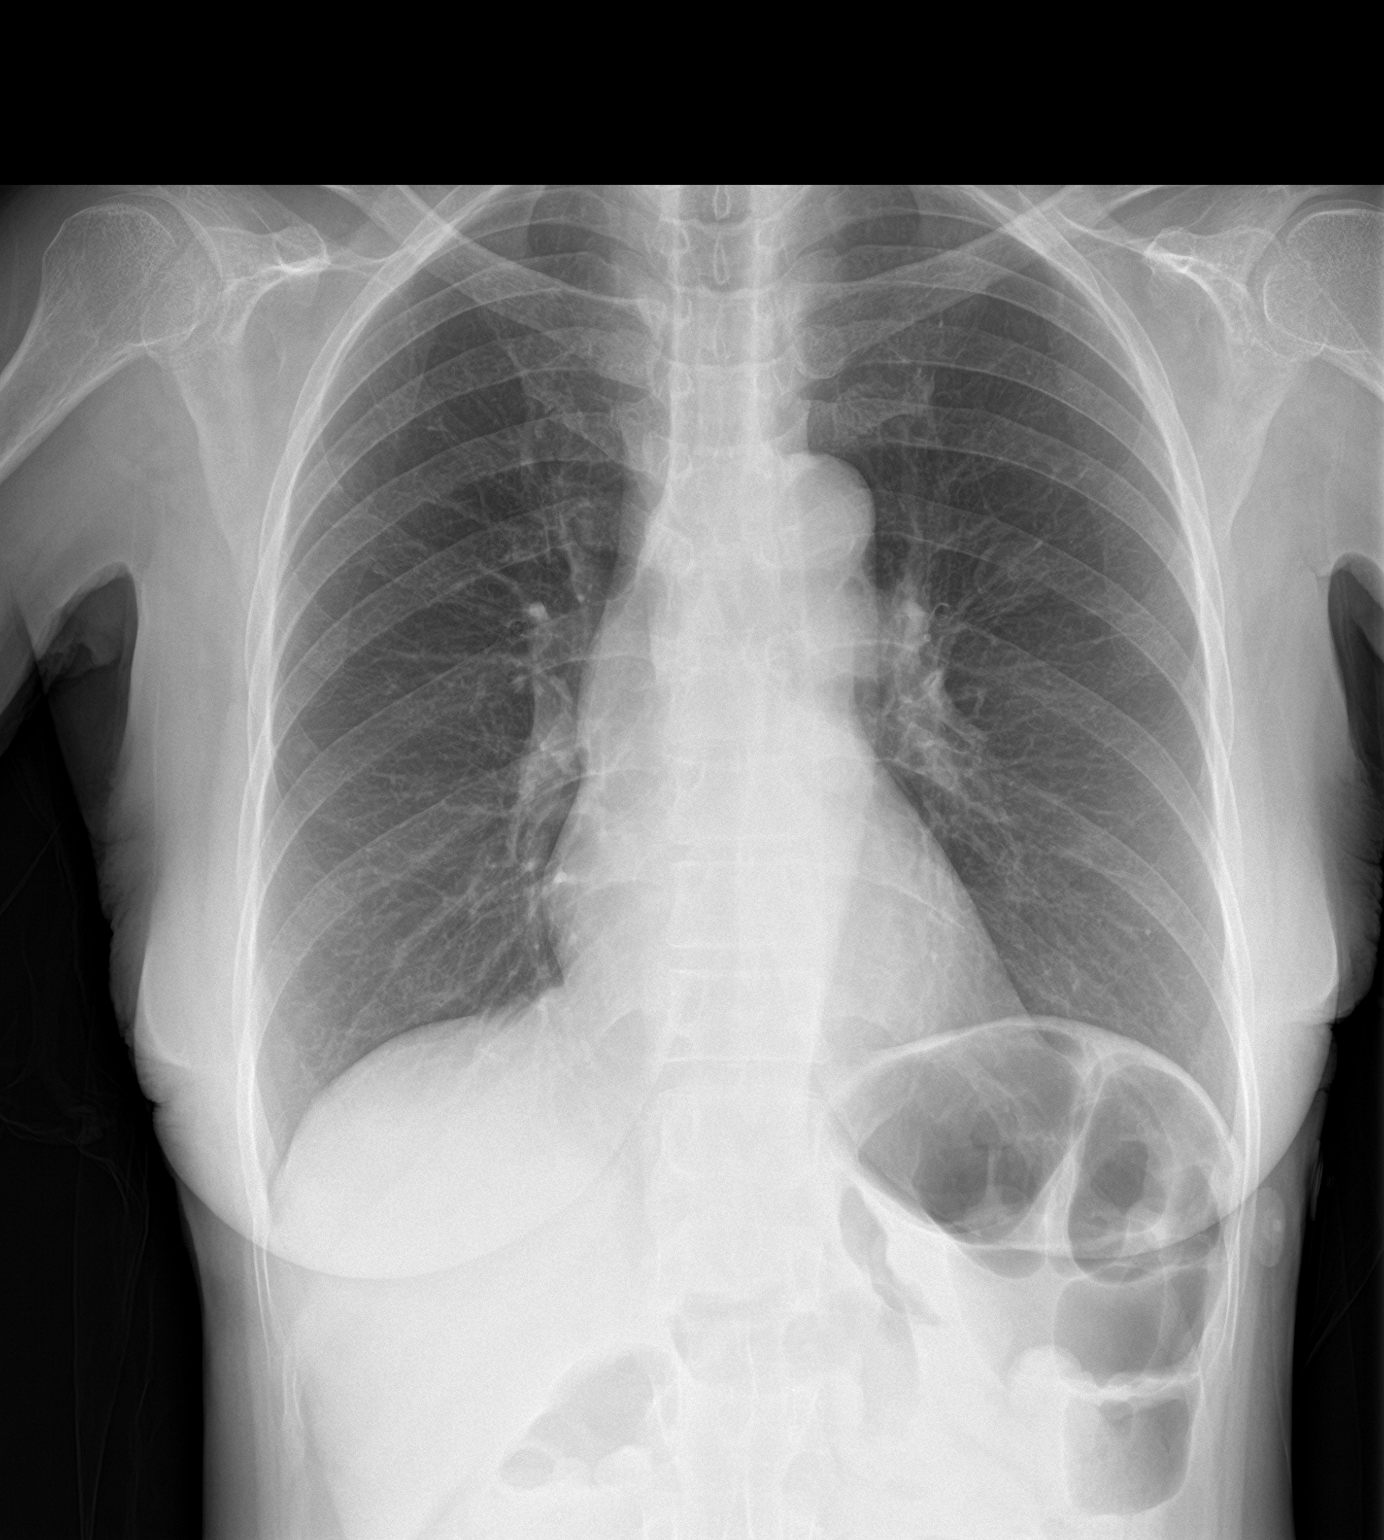

[chest lat]
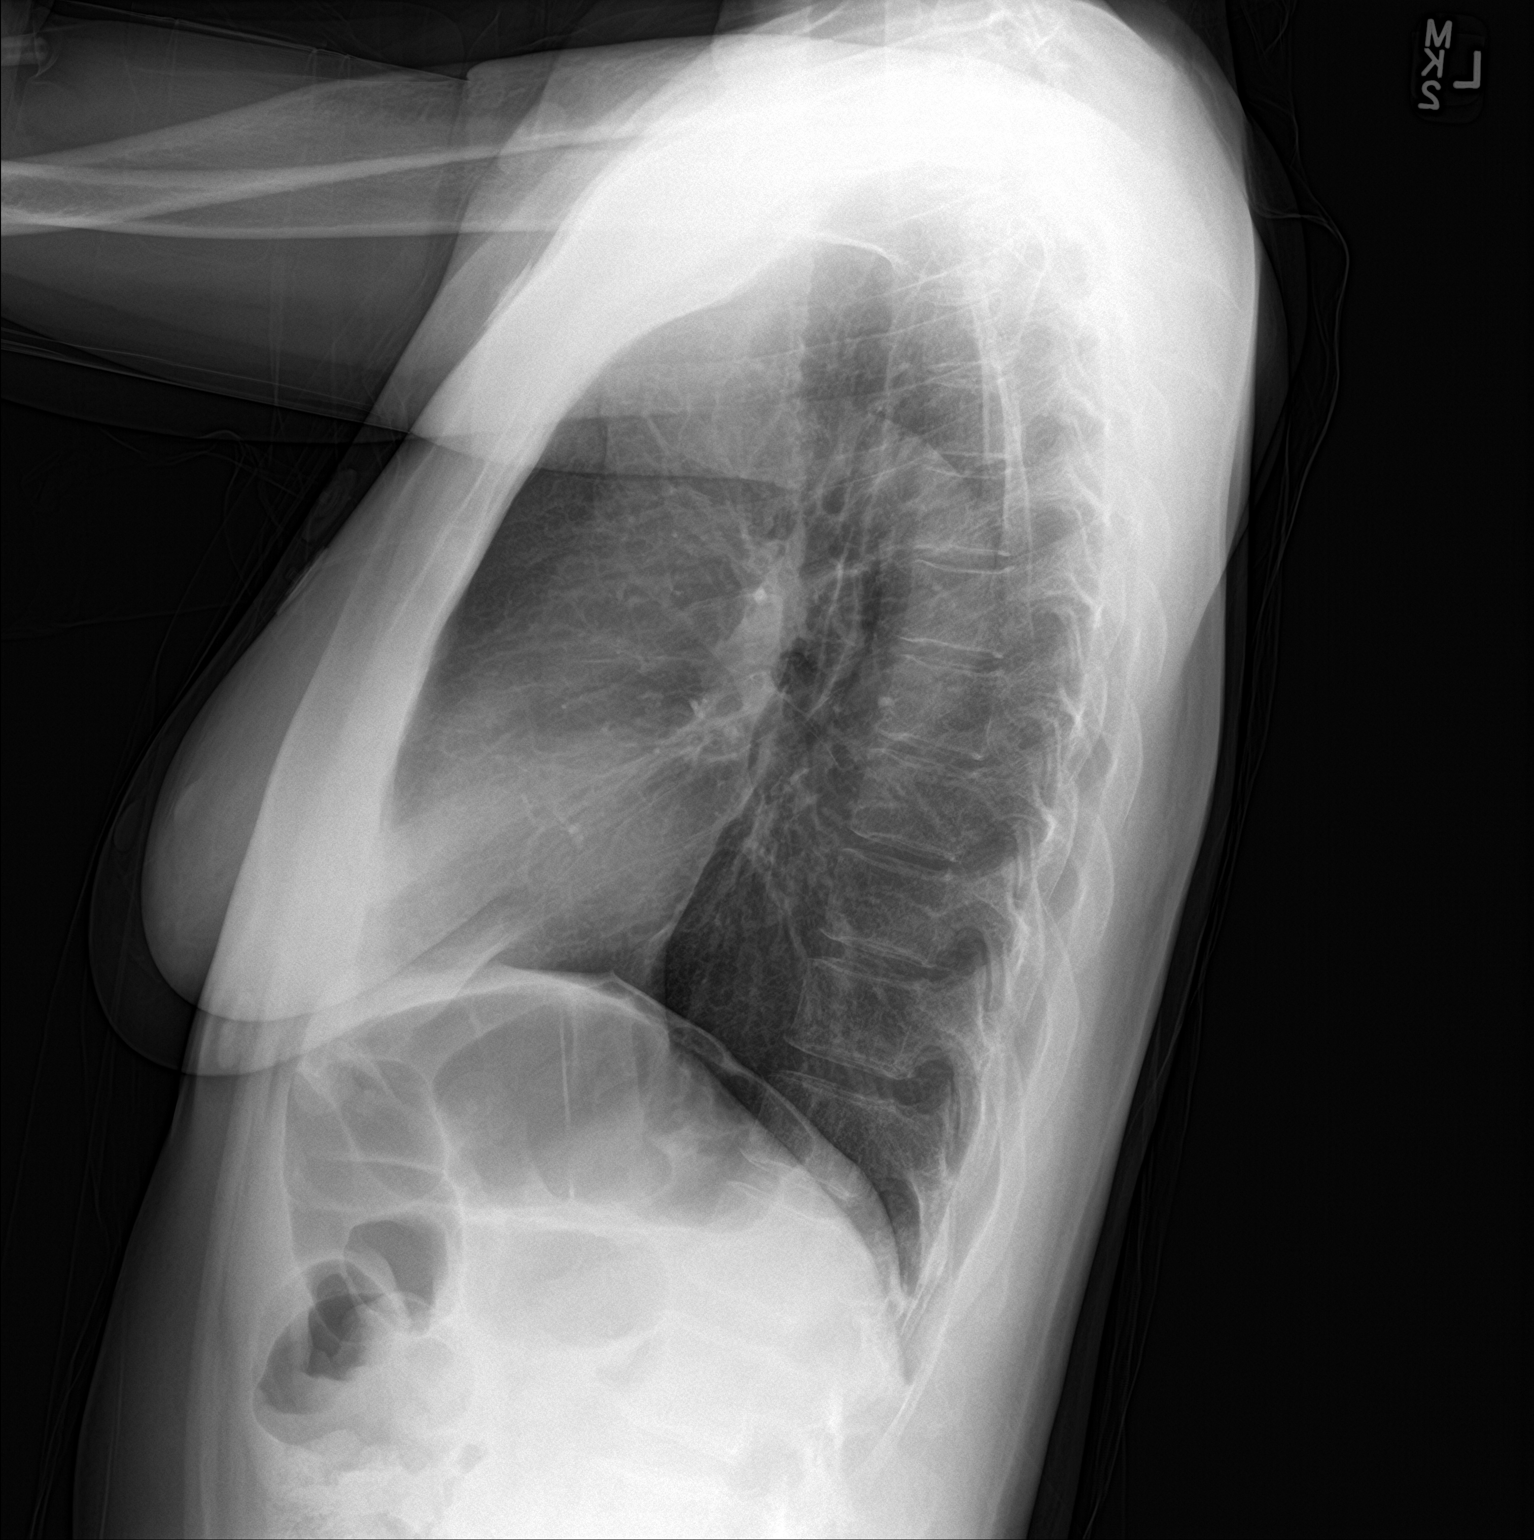

[2 of 2 positions shown; findings below may reference images not displayed]

FINDINGS: There is a small rounded density projecting over the anterior fifth
rib on the right. This appears to be new since prior study. There is
no pneumothorax. No large pleural effusion. Aortic calcifications
are noted. The heart size is normal. There is no acute osseous
abnormality.
IMPRESSION: 1. No acute cardiopulmonary process.
2. Small rounded density projecting over the anterior fifth rib on
the right. A 4-6 week follow-up two-view chest x-ray is recommended
to confirm resolution of this finding.

## 2020-04-05 ENCOUNTER — Other Ambulatory Visit: Payer: Self-pay | Admitting: Endocrinology

## 2020-04-05 DIAGNOSIS — I10 Essential (primary) hypertension: Secondary | ICD-10-CM | POA: Diagnosis not present

## 2020-04-05 DIAGNOSIS — E1140 Type 2 diabetes mellitus with diabetic neuropathy, unspecified: Secondary | ICD-10-CM | POA: Diagnosis not present

## 2020-04-05 DIAGNOSIS — Z794 Long term (current) use of insulin: Secondary | ICD-10-CM | POA: Diagnosis not present

## 2020-04-05 DIAGNOSIS — E1042 Type 1 diabetes mellitus with diabetic polyneuropathy: Secondary | ICD-10-CM

## 2020-04-06 ENCOUNTER — Telehealth: Payer: Self-pay | Admitting: Endocrinology

## 2020-04-06 NOTE — Telephone Encounter (Signed)
please contact patient: F/u OV is due

## 2020-04-06 NOTE — Telephone Encounter (Signed)
Lab results received from Dr. Stephanie Acre. At Dr. Cordelia Pen and Dr. Stephanie Acre' request, called pt to schedule an appt to further discuss. LVM requesting returned call.

## 2020-04-09 ENCOUNTER — Other Ambulatory Visit: Payer: Self-pay

## 2020-04-09 ENCOUNTER — Ambulatory Visit: Payer: Medicare HMO | Admitting: Rehabilitative and Restorative Service Providers"

## 2020-04-09 ENCOUNTER — Encounter: Payer: Self-pay | Admitting: Rehabilitative and Restorative Service Providers"

## 2020-04-09 DIAGNOSIS — R600 Localized edema: Secondary | ICD-10-CM | POA: Diagnosis not present

## 2020-04-09 DIAGNOSIS — M25662 Stiffness of left knee, not elsewhere classified: Secondary | ICD-10-CM | POA: Diagnosis not present

## 2020-04-09 DIAGNOSIS — M25562 Pain in left knee: Secondary | ICD-10-CM

## 2020-04-09 DIAGNOSIS — R262 Difficulty in walking, not elsewhere classified: Secondary | ICD-10-CM

## 2020-04-09 NOTE — Telephone Encounter (Signed)
FINAL ATTEMPT - LETTER MAILED  LVM requesting returned call. A letter has also been mailed to pt home address requesting she call our office to schedule an appointment. For future reference, letter can be found in [place].

## 2020-04-09 NOTE — Telephone Encounter (Signed)
SECOND ATTEMPT: ° °LVM requesting returned call. °

## 2020-04-09 NOTE — Therapy (Signed)
[place], [place], [address] Phone: [phone]   Fax:  [phone]  Physical Therapy Treatment  Patient Details  Name: Tricia Huynh MRN: [id number] Date of Birth: [birth year] Referring Provider (PT): Edmonia Lynch, MD   Encounter Date: 04/09/2020  PT End of Session - 04/09/20 1805    Visit Number  2    Number of Visits  12    Date for PT Re-Evaluation  05/16/20    Authorization Type  [employer] authorization for 12 visits    PT Start Time  1720    PT Stop Time  1805    PT Time Calculation (min)  45 min    Activity Tolerance  Patient tolerated treatment well    Behavior During Therapy  [facility] for tasks assessed/performed       Past Medical History:  Diagnosis Date  . Diabetes mellitus type 2 in nonobese ([place]) 06/06/2015  . Diabetes mellitus without complication ([place])   . Diabetic neuropathy ([place]) 06/06/2015  . Dyslipidemia 06/06/2015  . Encephalopathy   . Essential hypertension 06/06/2015  . Hypertension   . Mood disorder ([place]) 06/06/2015  . Pneumonia 03/11/2017  . Seizure [facility])    sees [name]. abd eeg, on keppra    Past Surgical History:  Procedure Laterality Date  . ABDOMINAL HYSTERECTOMY    . EYE SURGERY Bilateral   . TUBAL LIGATION      There were no vitals filed for this visit.  Subjective Assessment - 04/09/20 1720    Subjective  [name] reports significant progress in just 1 PT visit.    Pertinent History  diabetic, history of seizures (no seizures in past several years)    Limitations  House hold activities;Lifting;Standing;Walking    Diagnostic tests  X-rays    Patient Stated Goals  Get knee better    Currently in Pain?  No/denies    Pain Onset  More than a month ago         [facility] PT Assessment - 04/09/20 0001      AROM   Left Knee Flexion  150                    [facility] Adult PT Treatment/Exercise - 04/09/20 0001      Exercises   Exercises  Knee/Hip      Knee/Hip Exercises: Aerobic   Nustep  L2 8 minutes      Knee/Hip Exercises: Machines for Strengthening   Total Gym Leg Press  2 sets of 10 with 35#      Knee/Hip Exercises: Seated   Long Arc Quad  2 sets;10 reps      Knee/Hip Exercises: Supine   Quad Sets  2 sets;20 reps    Knee Flexion  1 set;5 reps    Other Supine Knee/Hip Exercises  Elevated ankle pumps 20X             PT Education - 04/09/20 1807    Education Details  Reviewed HEP.  Discussed the importance of avoiding valgus knees with squatting and ADLs.    Person(s) Educated  Patient    Methods  Explanation;Demonstration;Tactile cues;Verbal cues    Comprehension  Returned demonstration;Verbalized understanding;Verbal cues required;Need further instruction;Tactile cues required       PT Short Term Goals - 03/28/20 2037      PT SHORT TERM GOAL #1   Title  Independent with initial HEP    Baseline  needs HEP-instructed at eval  Time  3    Period  Weeks    Status  New      PT SHORT TERM GOAL #2   Title  Perform SLR without quad lag for improved quad activation for progression toward LTG for strength    Baseline  difficulty/limited left quad activation    Time  6    Period  Weeks    Status  New        PT Long Term Goals - 03/28/20 2034      PT LONG TERM GOAL #1   Title  Independent with advanced HEP for progression of knee strengthenig after d/c from therapy    Baseline  needs HEP    Time  6    Period  Weeks    Status  New    Target Date  05/16/20      PT LONG TERM GOAL #2   Title  Improve FOTO outcome measure score to 29% or less impairment    Baseline  37% limited    Time  6    Period  Weeks    Status  New    Target Date  05/16/20      PT LONG TERM GOAL #3   Title  Increase left knee strength to 4+/5 or greater to improve knee stability for stair navigation    Baseline  4-/5 for both extension and flexion    Time  6    Period  Weeks    Status  New    Target Date   05/16/20      PT LONG TERM GOAL #4   Title  Increase left knee flexion AROM 10 deg or greater to improve ability for descending stairs, bending knee to donn shoes    Baseline  110 deg    Time  6    Period  Weeks    Status  New    Target Date  05/16/20      PT LONG TERM GOAL #5   Title  Perform community level ambulation and tolerate IADLs with left knee pain decreased 75% or greater from baseline status    Baseline  decreased tolerance due to knee pain    Time  6    Period  Weeks    Status  New    Target Date  05/16/20            Plan - 04/09/20 1806    Clinical Impression Statement  Tricia Huynh is making rapid progress with her supervised PT.  AROM is now normal.  She will benefit from continued quadriceps strength work to avoid valgus and improve independence with ADLs.    Personal Factors and Comorbidities  Comorbidity 1    Comorbidities  diabetic    Examination-Activity Limitations  Stairs;Stand;Locomotion Level;Squat;Lift;Transfers    Examination-Participation Restrictions  Community Activity;Shop;Cleaning    Stability/Clinical Decision Making  Stable/Uncomplicated    Rehab Potential  Good    PT Frequency  2x / week    PT Duration  6 weeks    PT Treatment/Interventions  ADLs/Self Care Home Management;Cryotherapy;Ultrasound;Electrical Stimulation;Iontophoresis 4mg /ml Dexamethasone;Moist Heat;Therapeutic activities;Functional mobility training;Stair training;Gait training;Therapeutic exercise;Balance training;Neuromuscular re-education;Patient/family education;Manual techniques;Passive range of motion;Dry needling;Vasopneumatic Device;Taping    PT Next Visit Plan  review HEP as needed, consider [place] vs. ionto anterior knee/patella, pending tolerate possible warm up on NUSTEP, quad activation with quad sets, SAQ, SLR as able, knee ROM, standing exercises as tolerated, pending ability SLR potential consideration [place] estim to assist quad activation  PT Home Exercise Plan   [id number]: heel slides with strap assist for knee flexion, ankle pumps with legs elevated for edema, LAQ, quad sets    Consulted and Agree with Plan of Care  Patient       Patient will benefit from skilled therapeutic intervention in order to improve the following deficits and impairments:  Pain, Decreased strength, Decreased activity tolerance, Increased edema, Decreased range of motion, Difficulty walking  Visit Diagnosis: Stiffness of left knee, not elsewhere classified  Acute pain of left knee  Localized edema  Difficulty in walking, not elsewhere classified     Problem List Patient Active Problem List   Diagnosis Date Noted  . HCAP (healthcare-associated pneumonia) 03/11/2017  . Dyslipidemia 03/11/2017  . Diabetes mellitus with complication ([place])   . Chest pain 12/30/2016  . Seizure disorder ([place]) 12/30/2016  . Diabetic ketoacidosis without coma associated with type 2 diabetes mellitus ([place])   . DKA (diabetic ketoacidoses) ([place]) 11/15/2016  . UTI (urinary tract infection) 11/15/2016  . Acute cystitis without hematuria   . Acute renal failure ([place])   . Hyperglycemia 11/14/2016  . Diabetes ([place]) 06/05/2016  . Altered mental status   . CKD (chronic kidney disease) stage 3, GFR 30-59 ml/min 05/26/2016  . Benign essential HTN 05/26/2016  . Seizures ([place]) 01/08/2016  . Memory loss 10/04/2015  . Acute encephalopathy 06/06/2015  . Diabetic neuropathy ([place]) 06/06/2015    Farley Ly  PT, MPT 04/09/2020, 6:11 PM  [facility] [address] [place], [place], [address] Phone: [phone]   Fax:  [phone]  Name: Tricia Huynh MRN: [id number] Date of Birth: 1956/05/01

## 2020-04-13 DIAGNOSIS — S8002XA Contusion of left knee, initial encounter: Secondary | ICD-10-CM | POA: Diagnosis not present

## 2020-04-18 ENCOUNTER — Ambulatory Visit: Payer: Medicare HMO | Attending: Orthopedic Surgery | Admitting: Rehabilitative and Restorative Service Providers"

## 2020-04-18 ENCOUNTER — Telehealth: Payer: Self-pay | Admitting: Rehabilitative and Restorative Service Providers"

## 2020-04-18 DIAGNOSIS — R262 Difficulty in walking, not elsewhere classified: Secondary | ICD-10-CM | POA: Insufficient documentation

## 2020-04-18 DIAGNOSIS — R600 Localized edema: Secondary | ICD-10-CM | POA: Insufficient documentation

## 2020-04-18 DIAGNOSIS — M25662 Stiffness of left knee, not elsewhere classified: Secondary | ICD-10-CM | POA: Insufficient documentation

## 2020-04-18 DIAGNOSIS — M25562 Pain in left knee: Secondary | ICD-10-CM | POA: Insufficient documentation

## 2020-04-18 NOTE — Telephone Encounter (Signed)
Called and reminded her of her next appointment on Monday with Gerald Stabs.

## 2020-04-23 ENCOUNTER — Encounter: Payer: Self-pay | Admitting: Physical Therapy

## 2020-04-23 ENCOUNTER — Other Ambulatory Visit: Payer: Self-pay

## 2020-04-23 ENCOUNTER — Ambulatory Visit: Payer: Medicare HMO | Admitting: Physical Therapy

## 2020-04-23 DIAGNOSIS — R262 Difficulty in walking, not elsewhere classified: Secondary | ICD-10-CM | POA: Diagnosis not present

## 2020-04-23 DIAGNOSIS — M25662 Stiffness of left knee, not elsewhere classified: Secondary | ICD-10-CM | POA: Diagnosis not present

## 2020-04-23 DIAGNOSIS — R600 Localized edema: Secondary | ICD-10-CM

## 2020-04-23 DIAGNOSIS — M25562 Pain in left knee: Secondary | ICD-10-CM | POA: Diagnosis not present

## 2020-04-23 NOTE — Therapy (Signed)
[place], [place], [address] Phone: [phone]   Fax:  [phone]  Physical Therapy Treatment  Patient Details  Name: Tricia Huynh MRN: [id number] Date of Birth: 02-Mar-1956 Referring Provider (PT): Edmonia Lynch, MD   Encounter Date: 04/23/2020  PT End of Session - 04/23/20 3710    Visit Number  3    Number of Visits  12    Date for PT Re-Evaluation  05/16/20    Authorization Type  [employer]    Authorization Time Period  03/28/20-05/16/20    Authorization - Visit Number  3    Authorization - Number of Visits  12    PT Start Time  6269    PT Stop Time  4854    PT Time Calculation (min)  40 min    Activity Tolerance  Patient tolerated treatment well    Behavior During Therapy  [facility] for tasks assessed/performed       Past Medical History:  Diagnosis Date  . Diabetes mellitus type 2 in nonobese ([place]) 06/06/2015  . Diabetes mellitus without complication ([place])   . Diabetic neuropathy ([place]) 06/06/2015  . Dyslipidemia 06/06/2015  . Encephalopathy   . Essential hypertension 06/06/2015  . Hypertension   . Mood disorder ([place]) 06/06/2015  . Pneumonia 03/11/2017  . Seizure [facility])    sees [name]. abd eeg, on keppra    Past Surgical History:  Procedure Laterality Date  . ABDOMINAL HYSTERECTOMY    . EYE SURGERY Bilateral   . TUBAL LIGATION      There were no vitals filed for this visit.  Subjective Assessment - 04/23/20 1649    Subjective  No pain pre-tx. but still noting some stiffness in knee. She notes improvement with knee flexion ROM/"bending knee better."    Currently in Pain?  No/denies         [facility] PT Assessment - 04/23/20 0001      AROM   Left Knee Extension  0    Left Knee Flexion  148      Strength   Left Knee Extension  4/5                    [facility] Adult PT Treatment/Exercise - 04/23/20 0001      Knee/Hip Exercises: Aerobic   Recumbent Bike  L1 x 5 min      Knee/Hip Exercises: Machines for Strengthening   Total Gym Leg Press  2 sets of 10 with 35#      Knee/Hip Exercises: Standing   Lateral Step Up  Left;15 reps;Hand Hold: 2;Step Height: 4"    Forward Step Up  Left;2 sets;10 reps;Hand Hold: 1;Step Height: 4"    Functional Squat Limitations  partial squat at counter 2x10      Knee/Hip Exercises: Supine   Short Arc Quad Sets  AROM;Strengthening;Left;20 reps    Short Arc Quad Sets Limitations  2 lbs.    Bridges with Diona Foley Squeeze  AROM;Strengthening;Both;10 reps    Straight Leg Raises  AROM;Strengthening;Left;20 reps      Modalities   Modalities  Ultrasound      Ultrasound   Ultrasound Location  left patellar region    Ultrasound Parameters  3 MHZ 50% 1.0 W/cm2 x 8 min    Ultrasound Goals  Edema;Pain               PT Short Term Goals - 03/28/20 2037      PT SHORT TERM GOAL #1  Title  Independent with initial HEP    Baseline  needs HEP-instructed at eval    Time  3    Period  Weeks    Status  New      PT SHORT TERM GOAL #2   Title  Perform SLR without quad lag for improved quad activation for progression toward LTG for strength    Baseline  difficulty/limited left quad activation    Time  6    Period  Weeks    Status  New        PT Long Term Goals - 03/28/20 2034      PT LONG TERM GOAL #1   Title  Independent with advanced HEP for progression of knee strengthenig after d/c from therapy    Baseline  needs HEP    Time  6    Period  Weeks    Status  New    Target Date  05/16/20      PT LONG TERM GOAL #2   Title  Improve FOTO outcome measure score to 29% or less impairment    Baseline  37% limited    Time  6    Period  Weeks    Status  New    Target Date  05/16/20      PT LONG TERM GOAL #3   Title  Increase left knee strength to 4+/5 or greater to improve knee stability for stair navigation    Baseline  4-/5 for both extension and flexion    Time  6    Period  Weeks    Status  New    Target Date   05/16/20      PT LONG TERM GOAL #4   Title  Increase left knee flexion AROM 10 deg or greater to improve ability for descending stairs, bending knee to donn shoes    Baseline  110 deg    Time  6    Period  Weeks    Status  New    Target Date  05/16/20      PT LONG TERM GOAL #5   Title  Perform community level ambulation and tolerate IADLs with left knee pain decreased 75% or greater from baseline status    Baseline  decreased tolerance due to knee pain    Time  6    Period  Weeks    Status  New    Target Date  05/16/20            Plan - 04/23/20 1701    Clinical Impression Statement  Pt. doing well with knee ROM gains from baseline/minimal stiffness with flexion. Primary objective limitation at this point is quad/knee weakness with associated functional limitations for activities such as transfers from low seats. Pt. would benefit from continued PT for further progress to address weakness and improve functional status for mobility.    Personal Factors and Comorbidities  Comorbidity 1    Comorbidities  diabetic    Examination-Activity Limitations  Stairs;Stand;Locomotion Level;Squat;Lift;Transfers    Examination-Participation Restrictions  Community Activity;Shop;Cleaning    Stability/Clinical Decision Making  Stable/Uncomplicated    Clinical Decision Making  Low    Rehab Potential  Good    PT Frequency  2x / week    PT Duration  6 weeks    PT Treatment/Interventions  ADLs/Self Care Home Management;Cryotherapy;Ultrasound;Electrical Stimulation;Iontophoresis 4mg /ml Dexamethasone;Moist Heat;Therapeutic activities;Functional mobility training;Stair training;Gait training;Therapeutic exercise;Balance training;Neuromuscular re-education;Patient/family education;Manual techniques;Passive range of motion;Dry needling;Vasopneumatic Device;Taping    PT Next Visit Plan  continue strengthening emphasis, prn [place] vs. ionto left patellar region    PT Home Exercise Plan  [id number]: heel slides  with strap assist for knee flexion, ankle pumps with legs elevated for edema, LAQ, quad sets    Consulted and Agree with Plan of Care  Patient       Patient will benefit from skilled therapeutic intervention in order to improve the following deficits and impairments:  Pain, Decreased strength, Decreased activity tolerance, Increased edema, Decreased range of motion, Difficulty walking  Visit Diagnosis: Stiffness of left knee, not elsewhere classified  Acute pain of left knee  Localized edema  Difficulty in walking, not elsewhere classified     Problem List Patient Active Problem List   Diagnosis Date Noted  . HCAP (healthcare-associated pneumonia) 03/11/2017  . Dyslipidemia 03/11/2017  . Diabetes mellitus with complication ([place])   . Chest pain 12/30/2016  . Seizure disorder ([place]) 12/30/2016  . Diabetic ketoacidosis without coma associated with type 2 diabetes mellitus ([place])   . DKA (diabetic ketoacidoses) ([place]) 11/15/2016  . UTI (urinary tract infection) 11/15/2016  . Acute cystitis without hematuria   . Acute renal failure ([place])   . Hyperglycemia 11/14/2016  . Diabetes ([place]) 06/05/2016  . Altered mental status   . CKD (chronic kidney disease) stage 3, GFR 30-59 ml/min 05/26/2016  . Benign essential HTN 05/26/2016  . Seizures ([place]) 01/08/2016  . Memory loss 10/04/2015  . Acute encephalopathy 06/06/2015  . Diabetic neuropathy ([place]) 06/06/2015   Beaulah Dinning, PT, DPT 04/23/20 5:14 PM  [place] [facility] [address] [place], [place], [address] Phone: [phone]   Fax:  [phone]  Name: Aradhya Shellenbarger MRN: [id number] Date of Birth: 07/29/1956

## 2020-04-25 ENCOUNTER — Telehealth: Payer: Self-pay | Admitting: Physical Therapy

## 2020-04-25 ENCOUNTER — Ambulatory Visit: Payer: Medicare HMO | Admitting: Physical Therapy

## 2020-04-25 NOTE — Telephone Encounter (Signed)
Attempted to call patient regarding no show for therapy appointment today. Left voicemail with reminder of next appointment time as well as facility attendance policy.

## 2020-04-27 DIAGNOSIS — H0520 Unspecified exophthalmos: Secondary | ICD-10-CM | POA: Diagnosis not present

## 2020-04-27 DIAGNOSIS — I10 Essential (primary) hypertension: Secondary | ICD-10-CM | POA: Diagnosis not present

## 2020-04-27 DIAGNOSIS — H16009 Unspecified corneal ulcer, unspecified eye: Secondary | ICD-10-CM | POA: Diagnosis not present

## 2020-04-27 DIAGNOSIS — E118 Type 2 diabetes mellitus with unspecified complications: Secondary | ICD-10-CM | POA: Diagnosis not present

## 2020-04-30 ENCOUNTER — Other Ambulatory Visit: Payer: Self-pay

## 2020-04-30 ENCOUNTER — Encounter: Payer: Self-pay | Admitting: Physical Therapy

## 2020-04-30 ENCOUNTER — Ambulatory Visit: Payer: Medicare HMO | Admitting: Physical Therapy

## 2020-04-30 DIAGNOSIS — R262 Difficulty in walking, not elsewhere classified: Secondary | ICD-10-CM | POA: Diagnosis not present

## 2020-04-30 DIAGNOSIS — M25562 Pain in left knee: Secondary | ICD-10-CM | POA: Diagnosis not present

## 2020-04-30 DIAGNOSIS — M25662 Stiffness of left knee, not elsewhere classified: Secondary | ICD-10-CM | POA: Diagnosis not present

## 2020-04-30 DIAGNOSIS — R600 Localized edema: Secondary | ICD-10-CM

## 2020-04-30 NOTE — Therapy (Signed)
[place], [place], [address] Phone: [phone]   Fax:  [phone]  Physical Therapy Treatment  Patient Details  Name: Tricia Huynh MRN: [id number] Date of Birth: 1956-09-05 Referring Provider (PT): Edmonia Lynch, MD   Encounter Date: 04/30/2020   PT End of Session - 04/30/20 1733    Visit Number 4    Number of Visits 12    Date for PT Re-Evaluation 05/16/20    Authorization Type [employer]    Authorization Time Period 03/28/20-05/16/20    Authorization - Visit Number 4    Authorization - Number of Visits 12    PT Start Time 2595    PT Stop Time 6387    PT Time Calculation (min) 40 min    Activity Tolerance Patient tolerated treatment well    Behavior During Therapy [facility] for tasks assessed/performed           Past Medical History:  Diagnosis Date  . Diabetes mellitus type 2 in nonobese ([place]) 06/06/2015  . Diabetes mellitus without complication ([place])   . Diabetic neuropathy ([place]) 06/06/2015  . Dyslipidemia 06/06/2015  . Encephalopathy   . Essential hypertension 06/06/2015  . Hypertension   . Mood disorder ([place]) 06/06/2015  . Pneumonia 03/11/2017  . Seizure [facility])    sees [name]. abd eeg, on keppra    Past Surgical History:  Procedure Laterality Date  . ABDOMINAL HYSTERECTOMY    . EYE SURGERY Bilateral   . TUBAL LIGATION      There were no vitals filed for this visit.   Subjective Assessment - 04/30/20 1651    Subjective Pt. noting some stiffness in knee and difficulty getting up from a chair after sitting but pain minimal              [facility] PT Assessment - 04/30/20 0001      Circumferential Edema   Circumferential - Right 34 cm    Circumferential - Left  35 cm                         [facility] Adult PT Treatment/Exercise - 04/30/20 0001      Knee/Hip Exercises: Aerobic   Recumbent Bike L1 x 5 min      Knee/Hip Exercises: Machines for Strengthening   Total Gym  Leg Press 2 sets of 10 with 35#      Knee/Hip Exercises: Standing   Lateral Step Up Left;2 sets;10 reps;Hand Hold: 2;Step Height: 4"    Forward Step Up Left;2 sets;10 reps;Hand Hold: 2;Step Height: 6"    Functional Squat Limitations partial squat at counter 2x10      Knee/Hip Exercises: Supine   Bridges AROM;Strengthening;Both;15 reps    Bridges Limitations legs on bolster    Straight Leg Raises AROM;Strengthening;Left;20 reps      Modalities   Modalities Iontophoresis      Ultrasound   Ultrasound Location left peri-patellar region    Ultrasound Parameters 3 MHZ 50% 1.0 W/cm2    Ultrasound Goals Edema;Pain      Iontophoresis   Type of Iontophoresis Dexamethasone    Location left medial patellar region    Dose 4 mg/mL, 1 mL    Time --   application of 4 home take home patch                 PT Education - 04/30/20 1730    Education Details iontophoresis    Person(s) Educated Patient  Methods Handout    Comprehension Verbalized understanding            PT Short Term Goals - 03/28/20 2037      PT SHORT TERM GOAL #1   Title Independent with initial HEP    Baseline needs HEP-instructed at eval    Time 3    Period Weeks    Status New      PT SHORT TERM GOAL #2   Title Perform SLR without quad lag for improved quad activation for progression toward LTG for strength    Baseline difficulty/limited left quad activation    Time 6    Period Weeks    Status New             PT Long Term Goals - 03/28/20 2034      PT LONG TERM GOAL #1   Title Independent with advanced HEP for progression of knee strengthenig after d/c from therapy    Baseline needs HEP    Time 6    Period Weeks    Status New    Target Date 05/16/20      PT LONG TERM GOAL #2   Title Improve FOTO outcome measure score to 29% or less impairment    Baseline 37% limited    Time 6    Period Weeks    Status New    Target Date 05/16/20      PT LONG TERM GOAL #3   Title Increase left  knee strength to 4+/5 or greater to improve knee stability for stair navigation    Baseline 4-/5 for both extension and flexion    Time 6    Period Weeks    Status New    Target Date 05/16/20      PT LONG TERM GOAL #4   Title Increase left knee flexion AROM 10 deg or greater to improve ability for descending stairs, bending knee to donn shoes    Baseline 110 deg    Time 6    Period Weeks    Status New    Target Date 05/16/20      PT LONG TERM GOAL #5   Title Perform community level ambulation and tolerate IADLs with left knee pain decreased 75% or greater from baseline status    Baseline decreased tolerance due to knee pain    Time 6    Period Weeks    Status New    Target Date 05/16/20                 Plan - 04/30/20 1733    Clinical Impression Statement Still with some mild knee swelling vs. contralateral LE suspect contributing to c/o stiffness but as noted last session good progess for knee ROM gains. Primary functional limitation at this point is quad weakness with difficulty/limitation for squatting and stepping motions impacting ability for activities such as transfers from low chairs and stair navigation. Progressed step height to 6 in. step for forward step up today and well-tolerated in terms of pain but quad weakness evident with eccentric difficulty for step down motion. Plan continue PT for further progress for strengthening and modaltities prn for inflammation and swelling (added trial ionto patch and will monitor response next visit)    Personal Factors and Comorbidities Comorbidity 1    Comorbidities diabetic    Examination-Participation Restrictions Community Activity;Shop;Cleaning    Stability/Clinical Decision Making Stable/Uncomplicated    Clinical Decision Making Low    Rehab Potential Good    PT  Frequency 2x / week    PT Duration 6 weeks    PT Treatment/Interventions ADLs/Self Care Home Management;Cryotherapy;Ultrasound;Electrical  Stimulation;Iontophoresis 4mg /ml Dexamethasone;Moist Heat;Therapeutic activities;Functional mobility training;Stair training;Gait training;Therapeutic exercise;Balance training;Neuromuscular re-education;Patient/family education;Manual techniques;Passive range of motion;Dry needling;Vasopneumatic Device;Taping    PT Next Visit Plan check response to ionto, continue knee strengthening and update HEP for closed chain strengthening exercises, [place] prn    PT Home Exercise Plan [id number]: heel slides with strap assist for knee flexion, ankle pumps with legs elevated for edema, LAQ, quad sets    Consulted and Agree with Plan of Care Patient           Patient will benefit from skilled therapeutic intervention in order to improve the following deficits and impairments:  Pain, Decreased strength, Decreased activity tolerance, Increased edema, Decreased range of motion, Difficulty walking  Visit Diagnosis: Acute pain of left knee  Stiffness of left knee, not elsewhere classified  Localized edema  Difficulty in walking, not elsewhere classified     Problem List Patient Active Problem List   Diagnosis Date Noted  . HCAP (healthcare-associated pneumonia) 03/11/2017  . Dyslipidemia 03/11/2017  . Diabetes mellitus with complication ([place])   . Chest pain 12/30/2016  . Seizure disorder ([place]) 12/30/2016  . Diabetic ketoacidosis without coma associated with type 2 diabetes mellitus ([place])   . DKA (diabetic ketoacidoses) ([place]) 11/15/2016  . UTI (urinary tract infection) 11/15/2016  . Acute cystitis without hematuria   . Acute renal failure ([place])   . Hyperglycemia 11/14/2016  . Diabetes ([place]) 06/05/2016  . Altered mental status   . CKD (chronic kidney disease) stage 3, GFR 30-59 ml/min 05/26/2016  . Benign essential HTN 05/26/2016  . Seizures ([place]) 01/08/2016  . Memory loss 10/04/2015  . Acute encephalopathy 06/06/2015  . Diabetic neuropathy ([place]) 06/06/2015    Beaulah Dinning, PT,  DPT 04/30/20 5:44 PM  [place] [facility] [address] [place], [place], [address] Phone: [phone]   Fax:  [phone]  Name: Tricia Huynh MRN: [id number] Date of Birth: [birth year]

## 2020-05-02 ENCOUNTER — Ambulatory Visit: Payer: Medicare HMO | Admitting: Physical Therapy

## 2020-05-03 ENCOUNTER — Other Ambulatory Visit: Payer: Self-pay

## 2020-05-03 ENCOUNTER — Ambulatory Visit: Payer: Medicare HMO

## 2020-05-03 DIAGNOSIS — R600 Localized edema: Secondary | ICD-10-CM

## 2020-05-03 DIAGNOSIS — M25562 Pain in left knee: Secondary | ICD-10-CM

## 2020-05-03 DIAGNOSIS — R262 Difficulty in walking, not elsewhere classified: Secondary | ICD-10-CM

## 2020-05-03 DIAGNOSIS — M25662 Stiffness of left knee, not elsewhere classified: Secondary | ICD-10-CM

## 2020-05-03 NOTE — Therapy (Signed)
[place], [place], [address] Phone: [phone]   Fax:  [phone]  Physical Therapy Treatment  Patient Details  Name: Tricia Huynh MRN: [id number] Date of Birth: November 19, 1955 Referring Provider (PT): Tricia Lynch, MD   Encounter Date: 05/03/2020   PT End of Session - 05/03/20 1623    Visit Number 5    Number of Visits 12    Date for PT Re-Evaluation 05/16/20    Authorization Type [employer]    Authorization Time Period 03/28/20-05/16/20    Authorization - Visit Number 5    Authorization - Number of Visits 12    PT Start Time 0417    PT Stop Time 0455    PT Time Calculation (min) 38 min    Activity Tolerance Patient tolerated treatment well    Behavior During Therapy [facility] for tasks assessed/performed           Past Medical History:  Diagnosis Date  . Diabetes mellitus type 2 in nonobese ([place]) 06/06/2015  . Diabetes mellitus without complication ([place])   . Diabetic neuropathy ([place]) 06/06/2015  . Dyslipidemia 06/06/2015  . Encephalopathy   . Essential hypertension 06/06/2015  . Hypertension   . Mood disorder ([place]) 06/06/2015  . Pneumonia 03/11/2017  . Seizure [facility])    sees Tricia Huynh. abd eeg, on keppra    Past Surgical History:  Procedure Laterality Date  . ABDOMINAL HYSTERECTOMY    . EYE SURGERY Bilateral   . TUBAL LIGATION      There were no vitals filed for this visit.   Subjective Assessment - 05/03/20 1622    Subjective She is getting better. . Las t pain was 2 days ago but did not last.  no problems with ionto patch  Biggest problem is knee gets tight.    Currently in Pain? No/denies                             [facility] Adult PT Treatment/Exercise - 05/03/20 0001      Knee/Hip Exercises: Aerobic   Recumbent Bike L1 x 5 min      Knee/Hip Exercises: Machines for Strengthening   Total Gym Leg Press 2 sets of 10 with 45#      Knee/Hip Exercises: Standing   Forward  Step Up Left;Hand Hold: 2;Step Height: 6";15 reps      Knee/Hip Exercises: Supine   Bridges AROM;Strengthening;Both;20 reps    Bridges Limitations legs on bolster    Straight Leg Raises Left;20 reps      Ultrasound   Ultrasound Location /LT peri patella area    Ultrasound Parameters 3 MHz 50% 1 Wcm2    Ultrasound Goals Edema;Pain      Iontophoresis   Type of Iontophoresis Dexamethasone    Location left medial patellar region    Dose 4 mg/mL, 1 mL    Time 4-6 hours                  PT Education - 05/03/20 1657    Education Details ionto wear time and removal if skin irritated    Person(s) Educated Patient    Methods Explanation    Comprehension Verbalized understanding            PT Short Term Goals - 03/28/20 2037      PT SHORT TERM GOAL #1   Title Independent with initial HEP    Baseline needs HEP-instructed at eval  Time 3    Period Weeks    Status New      PT SHORT TERM GOAL #2   Title Perform SLR without quad lag for improved quad activation for progression toward LTG for strength    Baseline difficulty/limited left quad activation    Time 6    Period Weeks    Status New             PT Long Term Goals - 03/28/20 2034      PT LONG TERM GOAL #1   Title Independent with advanced HEP for progression of knee strengthenig after d/c from therapy    Baseline needs HEP    Time 6    Period Weeks    Status New    Target Date 05/16/20      PT LONG TERM GOAL #2   Title Improve FOTO outcome measure score to 29% or less impairment    Baseline 37% limited    Time 6    Period Weeks    Status New    Target Date 05/16/20      PT LONG TERM GOAL #3   Title Increase left knee strength to 4+/5 or greater to improve knee stability for stair navigation    Baseline 4-/5 for both extension and flexion    Time 6    Period Weeks    Status New    Target Date 05/16/20      PT LONG TERM GOAL #4   Title Increase left knee flexion AROM 10 deg or greater to  improve ability for descending stairs, bending knee to donn shoes    Baseline 110 deg    Time 6    Period Weeks    Status New    Target Date 05/16/20      PT LONG TERM GOAL #5   Title Perform community level ambulation and tolerate IADLs with left knee pain decreased 75% or greater from baseline status    Baseline decreased tolerance due to knee pain    Time 6    Period Weeks    Status New    Target Date 05/16/20                 Plan - 05/03/20 1624    Clinical Impression Statement Generally no pain . Tender medial LT knee and some pain with lateral step ups  that eased after stopping. Overall improved  Suggested to pt pain would eventually ease but may take some time.    PT Treatment/Interventions ADLs/Self Care Home Management;Cryotherapy;Ultrasound;Electrical Stimulation;Iontophoresis 4mg /ml Dexamethasone;Moist Heat;Therapeutic activities;Functional mobility training;Stair training;Gait training;Therapeutic exercise;Balance training;Neuromuscular re-education;Patient/family education;Manual techniques;Passive range of motion;Dry needling;Vasopneumatic Device;Taping    PT Next Visit Plan check response to ionto, continue knee strengthening and update HEP for closed chain strengthening exercises, [place] prn    PT Home Exercise Plan [id number]: heel slides with strap assist for knee flexion, ankle pumps with legs elevated for edema, LAQ, quad sets    Consulted and Agree with Plan of Care Patient           Patient will benefit from skilled therapeutic intervention in order to improve the following deficits and impairments:  Pain, Decreased strength, Decreased activity tolerance, Increased edema, Decreased range of motion, Difficulty walking  Visit Diagnosis: Acute pain of left knee  Stiffness of left knee, not elsewhere classified  Localized edema  Difficulty in walking, not elsewhere classified     Problem List Patient Active Problem List   Diagnosis Date Noted  .  HCAP  (healthcare-associated pneumonia) 03/11/2017  . Dyslipidemia 03/11/2017  . Diabetes mellitus with complication ([place])   . Chest pain 12/30/2016  . Seizure disorder ([place]) 12/30/2016  . Diabetic ketoacidosis without coma associated with type 2 diabetes mellitus ([place])   . DKA (diabetic ketoacidoses) ([place]) 11/15/2016  . UTI (urinary tract infection) 11/15/2016  . Acute cystitis without hematuria   . Acute renal failure ([place])   . Hyperglycemia 11/14/2016  . Diabetes ([place]) 06/05/2016  . Altered mental status   . CKD (chronic kidney disease) stage 3, GFR 30-59 ml/min 05/26/2016  . Benign essential HTN 05/26/2016  . Seizures ([place]) 01/08/2016  . Memory loss 10/04/2015  . Acute encephalopathy 06/06/2015  . Diabetic neuropathy ([place]) 06/06/2015    Darrel Hoover  PT 05/03/2020, 5:01 PM  [facility] [address] [place], [place], [address] Phone: [phone]   Fax:  [phone]  Name: Tricia Huynh MRN: [id number] Date of Birth: 06-Jul-1956

## 2020-05-07 ENCOUNTER — Encounter: Payer: Self-pay | Admitting: Physical Therapy

## 2020-05-07 ENCOUNTER — Ambulatory Visit: Payer: Medicare HMO | Admitting: Physical Therapy

## 2020-05-07 ENCOUNTER — Other Ambulatory Visit: Payer: Self-pay

## 2020-05-07 DIAGNOSIS — R262 Difficulty in walking, not elsewhere classified: Secondary | ICD-10-CM

## 2020-05-07 DIAGNOSIS — M25562 Pain in left knee: Secondary | ICD-10-CM | POA: Diagnosis not present

## 2020-05-07 DIAGNOSIS — R600 Localized edema: Secondary | ICD-10-CM

## 2020-05-07 DIAGNOSIS — M25662 Stiffness of left knee, not elsewhere classified: Secondary | ICD-10-CM | POA: Diagnosis not present

## 2020-05-07 NOTE — Therapy (Signed)
[place], [place], [address] Phone: [phone]   Fax:  [phone]  Physical Therapy Treatment  Patient Details  Name: Tricia Huynh MRN: [id number] Date of Birth: November 28, 1955 Referring Provider (PT): Edmonia Lynch, MD   Encounter Date: 05/07/2020   PT End of Session - 05/07/20 1626    Visit Number 6    Number of Visits 12    Date for PT Re-Evaluation 05/16/20    Authorization Type [employer]    Authorization Time Period 03/28/20-05/16/20    PT Start Time 1546    PT Stop Time 1625    PT Time Calculation (min) 39 min    Activity Tolerance Patient limited by fatigue;Patient limited by pain    Behavior During Therapy [facility] for tasks assessed/performed           Past Medical History:  Diagnosis Date  . Diabetes mellitus type 2 in nonobese ([place]) 06/06/2015  . Diabetes mellitus without complication ([place])   . Diabetic neuropathy ([place]) 06/06/2015  . Dyslipidemia 06/06/2015  . Encephalopathy   . Essential hypertension 06/06/2015  . Hypertension   . Mood disorder ([place]) 06/06/2015  . Pneumonia 03/11/2017  . Seizure [facility])    sees [name]. abd eeg, on keppra    Past Surgical History:  Procedure Laterality Date  . ABDOMINAL HYSTERECTOMY    . EYE SURGERY Bilateral   . TUBAL LIGATION      There were no vitals filed for this visit.   Subjective Assessment - 05/07/20 1604    Subjective No pain pre-tx. but still noting some issues with knee stiffness and swelling, soreness worse with prolonged walking.    Pertinent History diabetic, history of seizures (no seizures in past several years)    Currently in Pain? No/denies                             [facility] Adult PT Treatment/Exercise - 05/07/20 0001      Knee/Hip Exercises: Stretches   Sports administrator Limitations supine manual left quad/hip flexor stretch 20 sec x 2   limited by soreness     Knee/Hip Exercises: Aerobic   Recumbent Bike L1 x  5 min      Knee/Hip Exercises: Machines for Strengthening   Total Gym Leg Press 2 sets of 10 with 45#      Knee/Hip Exercises: Standing   Lateral Step Up Left;15 reps;Hand Hold: 2;Step Height: 4"    Forward Step Up Left;15 reps;Hand Hold: 1;Step Height: 6"    Functional Squat Limitations partial squat at counter 2x10   chair behind pt. for safety-see assessment     Knee/Hip Exercises: Supine   Bridges AROM;Strengthening;Both;20 reps    Bridges Limitations legs on bolster    Straight Leg Raises Left;20 reps      Ultrasound   Ultrasound Location left patellar regioj    Ultrasound Parameters 3 MHZ 50% 1/0 W/cm2 x 8 min   started100%,decreased to 50% due to some initial discomfort   Ultrasound Goals Edema;Pain      Iontophoresis   Type of Iontophoresis Dexamethasone    Location left medial patellar region    Dose 4 mg/mL, 1 mL    Time 4-6 hours                  PT Education - 05/07/20 1617    Education Details exercises    Person(s) Educated Patient    Methods Explanation;Demonstration;Verbal  cues    Comprehension Verbalized understanding            PT Short Term Goals - 03/28/20 2037      PT SHORT TERM GOAL #1   Title Independent with initial HEP    Baseline needs HEP-instructed at eval    Time 3    Period Weeks    Status New      PT SHORT TERM GOAL #2   Title Perform SLR without quad lag for improved quad activation for progression toward LTG for strength    Baseline difficulty/limited left quad activation    Time 6    Period Weeks    Status New             PT Long Term Goals - 03/28/20 2034      PT LONG TERM GOAL #1   Title Independent with advanced HEP for progression of knee strengthenig after d/c from therapy    Baseline needs HEP    Time 6    Period Weeks    Status New    Target Date 05/16/20      PT LONG TERM GOAL #2   Title Improve FOTO outcome measure score to 29% or less impairment    Baseline 37% limited    Time 6    Period  Weeks    Status New    Target Date 05/16/20      PT LONG TERM GOAL #3   Title Increase left knee strength to 4+/5 or greater to improve knee stability for stair navigation    Baseline 4-/5 for both extension and flexion    Time 6    Period Weeks    Status New    Target Date 05/16/20      PT LONG TERM GOAL #4   Title Increase left knee flexion AROM 10 deg or greater to improve ability for descending stairs, bending knee to donn shoes    Baseline 110 deg    Time 6    Period Weeks    Status New    Target Date 05/16/20      PT LONG TERM GOAL #5   Title Perform community level ambulation and tolerate IADLs with left knee pain decreased 75% or greater from baseline status    Baseline decreased tolerance due to knee pain    Time 6    Period Weeks    Status New    Target Date 05/16/20                 Plan - 05/07/20 1627    Clinical Impression Statement Pt. had some initial discomfort with [place] at 100% so decreased to 50% with improved tolerance-unclear reason soreness with this as past imaging (-) for fracture but will continue to monitor. Pt.'s quad fatigued quickly with ther-ex with some tendency for knee to buckle so used chair behind pt. for safety for squats and spotted/blocked knee for safety with stepping exercises. Primary objective limitation at this point for therapy is quad weakness with pt. still having difficulty with lack of eccentric control for stepping motions.    Personal Factors and Comorbidities Comorbidity 1    Comorbidities diabetic    Examination-Activity Limitations Stairs;Stand;Locomotion Level;Squat;Lift;Transfers    Stability/Clinical Decision Making Stable/Uncomplicated    Clinical Decision Making Low    Rehab Potential Good    PT Frequency 2x / week    PT Duration 6 weeks    PT Treatment/Interventions ADLs/Self Care Home Management;Cryotherapy;Ultrasound;Electrical Stimulation;Iontophoresis 4mg /ml Dexamethasone;Moist Heat;Therapeutic  activities;Functional mobility training;Stair training;Gait training;Therapeutic exercise;Balance training;Neuromuscular re-education;Patient/family education;Manual techniques;Passive range of motion;Dry needling;Vasopneumatic Device;Taping    PT Next Visit Plan recheck FOTO, monitor any pain with [place], progress quad strengthening as tolerated, modalities prn    PT Home Exercise Plan [id number]: heel slides with strap assist for knee flexion, ankle pumps with legs elevated for edema, LAQ, quad sets    Consulted and Agree with Plan of Care Patient           Patient will benefit from skilled therapeutic intervention in order to improve the following deficits and impairments:  Pain, Decreased strength, Decreased activity tolerance, Increased edema, Decreased range of motion, Difficulty walking  Visit Diagnosis: Acute pain of left knee  Stiffness of left knee, not elsewhere classified  Localized edema  Difficulty in walking, not elsewhere classified     Problem List Patient Active Problem List   Diagnosis Date Noted  . HCAP (healthcare-associated pneumonia) 03/11/2017  . Dyslipidemia 03/11/2017  . Diabetes mellitus with complication ([place])   . Chest pain 12/30/2016  . Seizure disorder ([place]) 12/30/2016  . Diabetic ketoacidosis without coma associated with type 2 diabetes mellitus ([place])   . DKA (diabetic ketoacidoses) ([place]) 11/15/2016  . UTI (urinary tract infection) 11/15/2016  . Acute cystitis without hematuria   . Acute renal failure ([place])   . Hyperglycemia 11/14/2016  . Diabetes ([place]) 06/05/2016  . Altered mental status   . CKD (chronic kidney disease) stage 3, GFR 30-59 ml/min 05/26/2016  . Benign essential HTN 05/26/2016  . Seizures ([place]) 01/08/2016  . Memory loss 10/04/2015  . Acute encephalopathy 06/06/2015  . Diabetic neuropathy ([place]) 06/06/2015    Beaulah Dinning, PT, DPT 05/07/20 4:31 PM  [facility] [address] [place], [place], [address] Phone: [phone]   Fax:  [phone]  Name: Tricia Huynh MRN: [id number] Date of Birth: 09-07-1956

## 2020-05-09 ENCOUNTER — Encounter: Payer: Self-pay | Admitting: Physical Therapy

## 2020-05-09 ENCOUNTER — Other Ambulatory Visit: Payer: Self-pay

## 2020-05-09 ENCOUNTER — Ambulatory Visit: Payer: Medicare HMO | Admitting: Physical Therapy

## 2020-05-09 DIAGNOSIS — M25562 Pain in left knee: Secondary | ICD-10-CM

## 2020-05-09 DIAGNOSIS — R262 Difficulty in walking, not elsewhere classified: Secondary | ICD-10-CM | POA: Diagnosis not present

## 2020-05-09 DIAGNOSIS — M25662 Stiffness of left knee, not elsewhere classified: Secondary | ICD-10-CM

## 2020-05-09 DIAGNOSIS — R600 Localized edema: Secondary | ICD-10-CM | POA: Diagnosis not present

## 2020-05-09 NOTE — Therapy (Signed)
[place], [place], [address] Phone: [phone]   Fax:  [phone]  Physical Therapy Treatment/Discharge  Patient Details  Name: Tricia Huynh MRN: [id number] Date of Birth: 05-08-1956 Referring Provider (PT): Edmonia Lynch, MD   Encounter Date: 05/09/2020   PT End of Session - 05/09/20 1649    Visit Number 7    Number of Visits 12    Date for PT Re-Evaluation 05/16/20    Authorization Type [employer]    Authorization Time Period 03/28/20-05/16/20    Authorization - Visit Number 6    Authorization - Number of Visits 12    PT Start Time 3832    PT Stop Time 1632    PT Time Calculation (min) 40 min    Activity Tolerance Patient tolerated treatment well    Behavior During Therapy [facility] for tasks assessed/performed           Past Medical History:  Diagnosis Date  . Diabetes mellitus type 2 in nonobese ([place]) 06/06/2015  . Diabetes mellitus without complication ([place])   . Diabetic neuropathy ([place]) 06/06/2015  . Dyslipidemia 06/06/2015  . Encephalopathy   . Essential hypertension 06/06/2015  . Hypertension   . Mood disorder ([place]) 06/06/2015  . Pneumonia 03/11/2017  . Seizure [facility])    sees [name]. abd eeg, on keppra    Past Surgical History:  Procedure Laterality Date  . ABDOMINAL HYSTERECTOMY    . EYE SURGERY Bilateral   . TUBAL LIGATION      There were no vitals filed for this visit.   Subjective Assessment - 05/09/20 1642    Subjective Still having some knee swelling and stiffness with prolonged standing and walking but no pain pre-tx., overall knee doing well and pt. reports feels ready for d/c from formal therapy.    Pertinent History diabetic, history of seizures (no seizures in past several years)    Limitations House hold activities;Lifting;Standing;Walking    Diagnostic tests X-rays    Patient Stated Goals Get knee better    Currently in Pain? No/denies              [facility] PT  Assessment - 05/09/20 0001      Observation/Other Assessments   Focus on Therapeutic Outcomes (FOTO)  15% limited      AROM   Left Knee Extension 0    Left Knee Flexion 135      Strength   Left Knee Flexion 5/5    Left Knee Extension 5/5                         [facility] Adult PT Treatment/Exercise - 05/09/20 0001      Knee/Hip Exercises: Stretches   Sports administrator Limitations prone quad stretch 3x30 sec      Knee/Hip Exercises: Aerobic   Recumbent Bike L2 x 5 min      Knee/Hip Exercises: Machines for Strengthening   Total Gym Leg Press 2x10 with 35 lbs.      Knee/Hip Exercises: Standing   Terminal Knee Extension AROM;Strengthening;Left;2 sets;10 reps    Theraband Level (Terminal Knee Extension) Level 4 (Blue)    Lateral Step Up Left;20 reps;Hand Hold: 2;Step Height: 4"    Forward Step Up Left;20 reps;Hand Hold: 1;Step Height: 6"    Functional Squat Limitations partial squat at counter 2x10   chair behind pt. for safety-see assessment     Knee/Hip Exercises: Supine   Bridges AROM;Strengthening;Both;20 reps  Bridges Limitations legs on bolster    Straight Leg Raises Left;20 reps      Iontophoresis   Type of Iontophoresis Dexamethasone    Location left medial patellar region    Dose 4 mg/mL, 1 mL    Time 4 hour take home patch                  PT Education - 05/09/20 1648    Education Details HEP updates (issued blue Theraband), POC    Person(s) Educated Patient    Methods Explanation;Demonstration;Verbal cues;Handout    Comprehension Returned demonstration;Verbalized understanding            PT Short Term Goals - 05/09/20 1653      PT SHORT TERM GOAL #1   Title Independent with initial HEP    Baseline met    Time 3    Period Weeks    Status Achieved      PT SHORT TERM GOAL #2   Title Perform SLR without quad lag for improved quad activation for progression toward LTG for strength    Baseline met/able    Time 6    Period Weeks     Status Achieved             PT Long Term Goals - 05/09/20 1653      PT LONG TERM GOAL #1   Title Independent with advanced HEP for progression of knee strengthenig after d/c from therapy    Baseline met-instructed today    Time 6    Period Weeks    Status Achieved      PT LONG TERM GOAL #2   Title Improve FOTO outcome measure score to 29% or less impairment    Baseline 15% limited    Time 6    Period Weeks    Status Achieved      PT LONG TERM GOAL #3   Title Increase left knee strength to 4+/5 or greater to improve knee stability for stair navigation    Baseline 5/5    Time 6    Period Weeks    Status Achieved      PT LONG TERM GOAL #4   Title Increase left knee flexion AROM 10 deg or greater to improve ability for descending stairs, bending knee to donn shoes    Baseline 135 deg    Time 6    Period Weeks    Status Achieved      PT LONG TERM GOAL #5   Title Perform community level ambulation and tolerate IADLs with left knee pain decreased 75% or greater from baseline status    Baseline met    Time 6    Period Weeks    Status Achieved                 Plan - 05/09/20 1649    Clinical Impression Statement Pt. has made good progress with therapy with improved left knee ROM and strength. Still some mild swelling but expect this will improve with further time. Therapy goals met-expect at this point pt. can continue progress independently with HEP and recommend follow up with MD with any changes in status.    Personal Factors and Comorbidities Comorbidity 1    Comorbidities diabetic    Examination-Activity Limitations Stairs;Stand;Locomotion Level;Squat;Lift;Transfers    Examination-Participation Restrictions Community Activity;Shop;Cleaning    Stability/Clinical Decision Making Stable/Uncomplicated    Clinical Decision Making Low    Rehab Potential Good    PT Frequency 2x /  week    PT Duration 6 weeks    PT Treatment/Interventions ADLs/Self Care Home  Management;Cryotherapy;Ultrasound;Electrical Stimulation;Iontophoresis 65m/ml Dexamethasone;Moist Heat;Therapeutic activities;Functional mobility training;Stair training;Gait training;Therapeutic exercise;Balance training;Neuromuscular re-education;Patient/family education;Manual techniques;Passive range of motion;Dry needling;Vasopneumatic Device;Taping    PT Next Visit Plan NA    PT Home Exercise Plan [phone]   Consulted and Agree with Plan of Care Patient           Patient will benefit from skilled therapeutic intervention in order to improve the following deficits and impairments:  Pain, Decreased strength, Decreased activity tolerance, Increased edema, Decreased range of motion, Difficulty walking  Visit Diagnosis: Acute pain of left knee  Stiffness of left knee, not elsewhere classified  Localized edema  Difficulty in walking, not elsewhere classified     Problem List Patient Active Problem List   Diagnosis Date Noted  . HCAP (healthcare-associated pneumonia) 03/11/2017  . Dyslipidemia 03/11/2017  . Diabetes mellitus with complication ([place]   . Chest pain 12/30/2016  . Seizure disorder ([place] 12/30/2016  . Diabetic ketoacidosis without coma associated with type 2 diabetes mellitus ([place]   . DKA (diabetic ketoacidoses) ([place] 11/15/2016  . UTI (urinary tract infection) 11/15/2016  . Acute cystitis without hematuria   . Acute renal failure ([place]   . Hyperglycemia 11/14/2016  . Diabetes ([place] 06/05/2016  . Altered mental status   . CKD (chronic kidney disease) stage 3, GFR 30-59 ml/min 05/26/2016  . Benign essential HTN 05/26/2016  . Seizures ([place] 01/08/2016  . Memory loss 10/04/2015  . Acute encephalopathy 06/06/2015  . Diabetic neuropathy ([place] 06/06/2015        PHYSICAL THERAPY DISCHARGE SUMMARY  Visits from Start of Care: 7  Current functional level related to goals / functional outcomes: Therapy goals met-patient appropriate for d/c to HEP     Remaining deficits: NA   Education / Equipment: HEP, issued blue Theraband Plan: Patient agrees to discharge.  Patient goals were met. Patient is being discharged due to meeting the stated rehab goals.  ?????           CBeaulah Dinning PT, DPT 05/09/20 4:56 PM       [facility] [address]Phone: [phone]  Fax:  [phone] Name: Tricia BalsleyMRN: [id number] of Birth: 504-19-1957

## 2020-06-11 DIAGNOSIS — E1165 Type 2 diabetes mellitus with hyperglycemia: Secondary | ICD-10-CM | POA: Diagnosis not present

## 2020-07-25 ENCOUNTER — Other Ambulatory Visit: Payer: Self-pay

## 2020-07-25 ENCOUNTER — Encounter (HOSPITAL_COMMUNITY): Payer: Self-pay | Admitting: Emergency Medicine

## 2020-07-25 ENCOUNTER — Ambulatory Visit (HOSPITAL_COMMUNITY)
Admission: EM | Admit: 2020-07-25 | Discharge: 2020-07-25 | Disposition: A | Discharge status: Home / Self Care | Payer: Medicare HMO | Attending: Family Medicine | Admitting: Family Medicine

## 2020-07-25 DIAGNOSIS — N183 Chronic kidney disease, stage 3 unspecified: Secondary | ICD-10-CM | POA: Insufficient documentation

## 2020-07-25 DIAGNOSIS — E1122 Type 2 diabetes mellitus with diabetic chronic kidney disease: Secondary | ICD-10-CM | POA: Diagnosis not present

## 2020-07-25 DIAGNOSIS — E1140 Type 2 diabetes mellitus with diabetic neuropathy, unspecified: Secondary | ICD-10-CM | POA: Diagnosis not present

## 2020-07-25 DIAGNOSIS — Z7982 Long term (current) use of aspirin: Secondary | ICD-10-CM | POA: Diagnosis not present

## 2020-07-25 DIAGNOSIS — Z20822 Contact with and (suspected) exposure to covid-19: Secondary | ICD-10-CM | POA: Diagnosis not present

## 2020-07-25 DIAGNOSIS — R112 Nausea with vomiting, unspecified: Secondary | ICD-10-CM | POA: Diagnosis not present

## 2020-07-25 DIAGNOSIS — Z87891 Personal history of nicotine dependence: Secondary | ICD-10-CM | POA: Diagnosis not present

## 2020-07-25 DIAGNOSIS — I129 Hypertensive chronic kidney disease with stage 1 through stage 4 chronic kidney disease, or unspecified chronic kidney disease: Secondary | ICD-10-CM | POA: Diagnosis not present

## 2020-07-25 DIAGNOSIS — Z79899 Other long term (current) drug therapy: Secondary | ICD-10-CM | POA: Diagnosis not present

## 2020-07-25 DIAGNOSIS — E785 Hyperlipidemia, unspecified: Secondary | ICD-10-CM | POA: Diagnosis not present

## 2020-07-25 DIAGNOSIS — Z794 Long term (current) use of insulin: Secondary | ICD-10-CM | POA: Insufficient documentation

## 2020-07-25 MED ORDER — ONDANSETRON 4 MG PO TBDP: 4.0000 mg | ORAL_TABLET | Freq: Three times a day (TID) | ORAL | 0 refills | Status: DC | PRN

## 2020-07-25 NOTE — ED Provider Notes (Signed)
[place]    CSN: [id number] Arrival date & time: 07/25/20  1748      History   Chief Complaint Chief Complaint  Patient presents with  . Headache    HPI Tricia Huynh is a 64 y.o. female.   About 2 days of chills, body aches, abdominal pain, N/V, anorexia. Denies notice of fever, cough, SOB, sore throat, CP, rashes. Not trying anything for sxs. Several sick contacts in the home.      Past Medical History:  Diagnosis Date  . Diabetes mellitus type 2 in nonobese ([place]) 06/06/2015  . Diabetes mellitus without complication ([place])   . Diabetic neuropathy ([place]) 06/06/2015  . Dyslipidemia 06/06/2015  . Encephalopathy   . Essential hypertension 06/06/2015  . Hypertension   . Mood disorder ([place]) 06/06/2015  . Pneumonia 03/11/2017  . Seizure [facility])    sees [name]. abd eeg, on keppra    Patient Active Problem List   Diagnosis Date Noted  . HCAP (healthcare-associated pneumonia) 03/11/2017  . Dyslipidemia 03/11/2017  . Diabetes mellitus with complication ([place])   . Chest pain 12/30/2016  . Seizure disorder ([place]) 12/30/2016  . Diabetic ketoacidosis without coma associated with type 2 diabetes mellitus ([place])   . DKA (diabetic ketoacidoses) ([place]) 11/15/2016  . UTI (urinary tract infection) 11/15/2016  . Acute cystitis without hematuria   . Acute renal failure ([place])   . Hyperglycemia 11/14/2016  . Diabetes ([place]) 06/05/2016  . Altered mental status   . CKD (chronic kidney disease) stage 3, GFR 30-59 ml/min 05/26/2016  . Benign essential HTN 05/26/2016  . Seizures ([place]) 01/08/2016  . Memory loss 10/04/2015  . Acute encephalopathy 06/06/2015  . Diabetic neuropathy ([place]) 06/06/2015    Past Surgical History:  Procedure Laterality Date  . ABDOMINAL HYSTERECTOMY    . EYE SURGERY Bilateral   . TUBAL LIGATION      OB History   No obstetric history on file.      Home Medications    Prior to Admission medications   Medication Sig Start Date End Date  Taking? Authorizing Provider  Accu-Chek Softclix Lancets lancets 1 each by Other route 2 (two) times daily. E11.9 12/20/19   Renato Shin, MD  acetaminophen (TYLENOL) 500 MG tablet Take 1,000 mg by mouth every 6 (six) hours as needed for mild pain or headache.     [provider]  amitriptyline (ELAVIL) 50 MG tablet Take 50 mg by mouth at bedtime. 05/08/15   [provider]  amLODipine-olmesartan (AZOR) 5-40 MG tablet Take 1 tablet by mouth daily. 10/08/16   [provider]  aspirin EC 81 MG tablet Take 81 mg by mouth daily.    [provider]  atorvastatin (LIPITOR) 40 MG tablet Take 40 mg by mouth daily. 12/23/19   [provider]  Blood Glucose Monitoring Suppl (ACCU-CHEK AVIVA PLUS) w/Device KIT 1 each by Does not apply route 2 (two) times daily. E11.9 12/20/19   Renato Shin, MD  cephALEXin (KEFLEX) 500 MG capsule Take 1 capsule (500 mg total) by mouth 4 (four) times daily. 02/17/20   Mesner, Corene Cornea, MD  gabapentin (NEURONTIN) 100 MG capsule Take 100 mg by mouth 3 (three) times daily. 12/23/19   [provider]  glucose blood (ACCU-CHEK AVIVA PLUS) test strip 1 each by Other route 2 (two) times daily. E11.9 12/20/19   Renato Shin, MD  hydrochlorothiazide (HYDRODIURIL) 25 MG tablet Take 25 mg by mouth daily. 01/13/19   [provider]  Insulin Glargine (LANTUS  SOLOSTAR) 100 UNIT/ML Solostar Pen Inject 24 Units into the skin every morning. And pen needles 1/day Patient taking differently: Inject 24 Units into the skin daily. And pen needles 1/day 12/20/19   Renato Shin, MD  metoprolol succinate (TOPROL-XL) 50 MG 24 hr tablet Take 50 mg by mouth at bedtime.  05/08/15   [provider]  ondansetron (ZOFRAN ODT) 4 MG disintegrating tablet Take 1 tablet (4 mg total) by mouth every 8 (eight) hours as needed for nausea or vomiting. 07/25/20   Volney American, PA-C  sertraline (ZOLOFT) 50 MG tablet Take 50 mg by mouth daily. 01/07/19    [provider]  vitamin E 400 UNIT capsule Take 400 Units by mouth daily.    [provider]  levETIRAcetam (KEPPRA) 750 MG tablet Take one tablet by mouth twice daily.  Please call [phone] to schedule yearly follow up appt. Patient not taking: Reported on 01/10/2020 08/04/19 02/17/20  Marcial Pacas, MD    Family History Family History  Problem Relation Age of Onset  . Heart disease Father   . Kidney disease Father   . Diabetes Mother   . Seizures Sister   . Seizures Brother     Social History Social History   Tobacco Use  . Smoking status: Former Smoker    Types: Cigarettes  . Smokeless tobacco: Never Used  Vaping Use  . Vaping Use: Never used  Substance Use Topics  . Alcohol use: No  . Drug use: No     Allergies   Percocet [oxycodone-acetaminophen] and Vicodin [hydrocodone-acetaminophen]   Review of Systems Review of Systems PER HPI  Physical Exam Triage Vital Signs ED Triage Vitals  Enc Vitals Group     BP 07/25/20 1924 (!) 153/95     Pulse Rate 07/25/20 1924 86     Resp 07/25/20 1924 19     Temp 07/25/20 1924 99.3 F (37.4 C)     Temp Source 07/25/20 1924 Oral     SpO2 07/25/20 1924 100 %     Weight --      Height --      Head Circumference --      Peak Flow --      Pain Score 07/25/20 1923 9     Pain Loc --      Pain Edu? --      Excl. in [place]? --    No data found.  Updated Vital Signs BP (!) 153/95 (BP Location: Right Arm)   Pulse 86   Temp 99.3 F (37.4 C) (Oral)   Resp 19   SpO2 100%   Visual Acuity Right Eye Distance:   Left Eye Distance:   Bilateral Distance:    Right Eye Near:   Left Eye Near:    Bilateral Near:     Physical Exam Vitals and nursing note reviewed.  Constitutional:      Appearance: Normal appearance. She is well-developed. She is not ill-appearing or toxic-appearing.  HENT:     Head: Atraumatic.     Right Ear: Tympanic membrane normal.     Left Ear: Tympanic membrane normal.     Nose: Nose  normal. No congestion.     Mouth/Throat:     Mouth: Mucous membranes are moist.     Pharynx: Oropharynx is clear.  Eyes:     Extraocular Movements: Extraocular movements intact.     Conjunctiva/sclera: Conjunctivae normal.  Cardiovascular:     Rate and Rhythm: Normal rate and regular rhythm.  Heart sounds: Normal heart sounds.  Pulmonary:     Effort: Pulmonary effort is normal.     Breath sounds: Normal breath sounds.  Abdominal:     General: Bowel sounds are normal. There is no distension.     Palpations: Abdomen is soft.     Tenderness: There is abdominal tenderness (mild lower abdominal ttp). There is no right CVA tenderness, left CVA tenderness or guarding.  Musculoskeletal:        General: Normal range of motion.     Cervical back: Normal range of motion and neck supple.  Skin:    General: Skin is warm and dry.  Neurological:     Mental Status: She is alert and oriented to person, place, and time.  Psychiatric:        Mood and Affect: Mood normal.        Thought Content: Thought content normal.        Judgment: Judgment normal.      UC Treatments / Results  Labs (all labs ordered are listed, but only abnormal results are displayed) Labs Reviewed  SARS CORONAVIRUS 2 (TAT 6-24 HRS)    EKG   Radiology No results found.  Procedures Procedures (including critical care time)  Medications Ordered in UC Medications - No data to display  Initial Impression / Assessment and Plan / UC Course  I have reviewed the triage vital signs and the nursing notes.  Pertinent labs & imaging results that were available during my care of the patient were reviewed by me and considered in my medical decision making (see chart for details).     Await COVID test results, isolate until these return and sxs resolve. Will rx zofran prn to help encourage better PO intake. Discussed supportive home care and strict return precautions. BRAT diet, advance as tolerated.    Final  Clinical Impressions(s) / UC Diagnoses   Final diagnoses:  Non-intractable vomiting with nausea, unspecified vomiting type   Discharge Instructions   None    ED Prescriptions    Medication Sig Dispense Auth. Provider   ondansetron (ZOFRAN ODT) 4 MG disintegrating tablet Take 1 tablet (4 mg total) by mouth every 8 (eight) hours as needed for nausea or vomiting. 21 tablet Volney American, [place]     PDMP not reviewed this encounter.   Volney American, [place] 07/25/20 2007

## 2020-07-25 NOTE — ED Triage Notes (Signed)
Pt presents with chills, body aches, headache, no appetite, abdominal pain, vomiting xs 2 days.   Denies chest pain, SOB, sore throat, or congestion.

## 2020-07-26 LAB — SARS CORONAVIRUS 2 (TAT 6-24 HRS): SARS Coronavirus 2: NEGATIVE

## 2020-08-06 DIAGNOSIS — I4891 Unspecified atrial fibrillation: Secondary | ICD-10-CM | POA: Diagnosis not present

## 2020-08-06 DIAGNOSIS — I272 Pulmonary hypertension, unspecified: Secondary | ICD-10-CM | POA: Diagnosis not present

## 2020-08-06 DIAGNOSIS — F331 Major depressive disorder, recurrent, moderate: Secondary | ICD-10-CM | POA: Diagnosis not present

## 2020-08-06 DIAGNOSIS — E1140 Type 2 diabetes mellitus with diabetic neuropathy, unspecified: Secondary | ICD-10-CM | POA: Diagnosis not present

## 2020-08-06 DIAGNOSIS — Z23 Encounter for immunization: Secondary | ICD-10-CM | POA: Diagnosis not present

## 2020-08-06 DIAGNOSIS — E1142 Type 2 diabetes mellitus with diabetic polyneuropathy: Secondary | ICD-10-CM | POA: Diagnosis not present

## 2020-08-06 DIAGNOSIS — I10 Essential (primary) hypertension: Secondary | ICD-10-CM | POA: Diagnosis not present

## 2020-08-19 ENCOUNTER — Emergency Department (HOSPITAL_COMMUNITY)
Admission: EM | Admit: 2020-08-19 | Discharge: 2020-08-20 | Disposition: A | Discharge status: Home / Self Care | Payer: Medicare HMO | Attending: Emergency Medicine | Admitting: Emergency Medicine

## 2020-08-19 ENCOUNTER — Encounter (HOSPITAL_COMMUNITY): Payer: Self-pay | Admitting: Emergency Medicine

## 2020-08-19 ENCOUNTER — Other Ambulatory Visit: Payer: Self-pay

## 2020-08-19 DIAGNOSIS — I129 Hypertensive chronic kidney disease with stage 1 through stage 4 chronic kidney disease, or unspecified chronic kidney disease: Secondary | ICD-10-CM | POA: Insufficient documentation

## 2020-08-19 DIAGNOSIS — R531 Weakness: Secondary | ICD-10-CM | POA: Diagnosis not present

## 2020-08-19 DIAGNOSIS — Z20822 Contact with and (suspected) exposure to covid-19: Secondary | ICD-10-CM | POA: Insufficient documentation

## 2020-08-19 DIAGNOSIS — E1140 Type 2 diabetes mellitus with diabetic neuropathy, unspecified: Secondary | ICD-10-CM | POA: Diagnosis not present

## 2020-08-19 DIAGNOSIS — R110 Nausea: Secondary | ICD-10-CM | POA: Diagnosis present

## 2020-08-19 DIAGNOSIS — N183 Chronic kidney disease, stage 3 unspecified: Secondary | ICD-10-CM | POA: Insufficient documentation

## 2020-08-19 DIAGNOSIS — Z79899 Other long term (current) drug therapy: Secondary | ICD-10-CM | POA: Insufficient documentation

## 2020-08-19 DIAGNOSIS — E1110 Type 2 diabetes mellitus with ketoacidosis without coma: Secondary | ICD-10-CM | POA: Insufficient documentation

## 2020-08-19 DIAGNOSIS — R519 Headache, unspecified: Secondary | ICD-10-CM | POA: Insufficient documentation

## 2020-08-19 DIAGNOSIS — Z87891 Personal history of nicotine dependence: Secondary | ICD-10-CM | POA: Insufficient documentation

## 2020-08-19 DIAGNOSIS — Z794 Long term (current) use of insulin: Secondary | ICD-10-CM | POA: Insufficient documentation

## 2020-08-19 DIAGNOSIS — I10 Essential (primary) hypertension: Secondary | ICD-10-CM | POA: Diagnosis not present

## 2020-08-19 DIAGNOSIS — R5383 Other fatigue: Secondary | ICD-10-CM | POA: Diagnosis not present

## 2020-08-19 DIAGNOSIS — Z7982 Long term (current) use of aspirin: Secondary | ICD-10-CM | POA: Insufficient documentation

## 2020-08-19 MED ORDER — KETOROLAC TROMETHAMINE 30 MG/ML IJ SOLN
30.0000 mg | Freq: Once | INTRAMUSCULAR | Status: AC
  Administered 2020-08-20: 30 mg via INTRAVENOUS
  Filled 2020-08-19: qty 1

## 2020-08-19 MED ORDER — SODIUM CHLORIDE 0.9 % IV BOLUS
1000.0000 mL | Freq: Once | INTRAVENOUS | Status: AC
  Administered 2020-08-20: 1000 mL via INTRAVENOUS

## 2020-08-19 NOTE — ED Triage Notes (Signed)
Pt reports having weakness and loss of appetite along with chills. Pt denies any fever, vomiting or diarrhea.

## 2020-08-19 NOTE — ED Provider Notes (Signed)
[facility] DEPT Provider Note   CSN: [id number] Arrival date & time: 08/19/20  2059     History Chief Complaint  Patient presents with  . Fatigue    Tricia Huynh is a 64 y.o. female.  Patient is a 64 year old female with history of diabetes, hypertension, seizures.  Patient presents today with complaints of nausea, headache, and feeling generally unwell.  The symptoms started yesterday.  She denies to me she is experiencing any fever, chills, or cough.  She denies any vomiting or diarrhea.  She does have history of diabetes, however sugar has been running in the 100s.  She denies any dysuria.  The history is provided by the patient.       Past Medical History:  Diagnosis Date  . Diabetes mellitus type 2 in nonobese ([place]) 06/06/2015  . Diabetes mellitus without complication ([place])   . Diabetic neuropathy ([place]) 06/06/2015  . Dyslipidemia 06/06/2015  . Encephalopathy   . Essential hypertension 06/06/2015  . Hypertension   . Mood disorder ([place]) 06/06/2015  . Pneumonia 03/11/2017  . Seizure [facility])    sees [name]. abd eeg, on keppra    Patient Active Problem List   Diagnosis Date Noted  . HCAP (healthcare-associated pneumonia) 03/11/2017  . Dyslipidemia 03/11/2017  . Diabetes mellitus with complication ([place])   . Chest pain 12/30/2016  . Seizure disorder ([place]) 12/30/2016  . Diabetic ketoacidosis without coma associated with type 2 diabetes mellitus ([place])   . DKA (diabetic ketoacidoses) 11/15/2016  . UTI (urinary tract infection) 11/15/2016  . Acute cystitis without hematuria   . Acute renal failure ([place])   . Hyperglycemia 11/14/2016  . Diabetes ([place]) 06/05/2016  . Altered mental status   . CKD (chronic kidney disease) stage 3, GFR 30-59 ml/min ([facility]) 05/26/2016  . Benign essential HTN 05/26/2016  . Seizures ([place]) 01/08/2016  . Memory loss 10/04/2015  . Acute encephalopathy 06/06/2015  . Diabetic neuropathy ([place]) 06/06/2015    Past  Surgical History:  Procedure Laterality Date  . ABDOMINAL HYSTERECTOMY    . EYE SURGERY Bilateral   . TUBAL LIGATION       OB History   No obstetric history on file.     Family History  Problem Relation Age of Onset  . Heart disease Father   . Kidney disease Father   . Diabetes Mother   . Seizures Sister   . Seizures Brother     Social History   Tobacco Use  . Smoking status: Former Smoker    Types: Cigarettes  . Smokeless tobacco: Never Used  Vaping Use  . Vaping Use: Never used  Substance Use Topics  . Alcohol use: No  . Drug use: No    Home Medications Prior to Admission medications   Medication Sig Start Date End Date Taking? Authorizing Provider  Accu-Chek Softclix Lancets lancets 1 each by Other route 2 (two) times daily. E11.9 12/20/19   Renato Shin, MD  acetaminophen (TYLENOL) 500 MG tablet Take 1,000 mg by mouth every 6 (six) hours as needed for mild pain or headache.     [provider]  amitriptyline (ELAVIL) 50 MG tablet Take 50 mg by mouth at bedtime. 05/08/15   [provider]  amLODipine-olmesartan (AZOR) 5-40 MG tablet Take 1 tablet by mouth daily. 10/08/16   [provider]  aspirin EC 81 MG tablet Take 81 mg by mouth daily.    [provider]  atorvastatin (LIPITOR) 40 MG tablet Take 40 mg by mouth  daily. 12/23/19   [provider]  Blood Glucose Monitoring Suppl (ACCU-CHEK AVIVA PLUS) w/Device KIT 1 each by Does not apply route 2 (two) times daily. E11.9 12/20/19   Renato Shin, MD  cephALEXin (KEFLEX) 500 MG capsule Take 1 capsule (500 mg total) by mouth 4 (four) times daily. 02/17/20   Mesner, Corene Cornea, MD  gabapentin (NEURONTIN) 100 MG capsule Take 100 mg by mouth 3 (three) times daily. 12/23/19   [provider]  glucose blood (ACCU-CHEK AVIVA PLUS) test strip 1 each by Other route 2 (two) times daily. E11.9 12/20/19   Renato Shin, MD  hydrochlorothiazide (HYDRODIURIL) 25 MG tablet Take 25 mg by mouth  daily. 01/13/19   [provider]  Insulin Glargine (LANTUS SOLOSTAR) 100 UNIT/ML Solostar Pen Inject 24 Units into the skin every morning. And pen needles 1/day Patient taking differently: Inject 24 Units into the skin daily. And pen needles 1/day 12/20/19   Renato Shin, MD  metoprolol succinate (TOPROL-XL) 50 MG 24 hr tablet Take 50 mg by mouth at bedtime.  05/08/15   [provider]  ondansetron (ZOFRAN ODT) 4 MG disintegrating tablet Take 1 tablet (4 mg total) by mouth every 8 (eight) hours as needed for nausea or vomiting. 07/25/20   Volney American, PA-C  sertraline (ZOLOFT) 50 MG tablet Take 50 mg by mouth daily. 01/07/19   [provider]  vitamin E 400 UNIT capsule Take 400 Units by mouth daily.    [provider]  levETIRAcetam (KEPPRA) 750 MG tablet Take one tablet by mouth twice daily.  Please call [phone] to schedule yearly follow up appt. Patient not taking: Reported on 01/10/2020 08/04/19 02/17/20  Marcial Pacas, MD    Allergies    Percocet [oxycodone-acetaminophen] and Vicodin [hydrocodone-acetaminophen]  Review of Systems   Review of Systems  All other systems reviewed and are negative.   Physical Exam Updated Vital Signs BP (!) 151/86 (BP Location: Right Arm)   Pulse 87   Temp 98.7 F (37.1 C) (Oral)   Resp 16   Ht 5' 7" (1.702 m)   Wt 59.4 kg   SpO2 100%   BMI 20.52 kg/m   Physical Exam Vitals and nursing note reviewed.  Constitutional:      General: She is not in acute distress.    Appearance: She is well-developed. She is not diaphoretic.  HENT:     Head: Normocephalic and atraumatic.  Cardiovascular:     Rate and Rhythm: Normal rate and regular rhythm.     Heart sounds: No murmur heard.  No friction rub. No gallop.   Pulmonary:     Effort: Pulmonary effort is normal. No respiratory distress.     Breath sounds: Normal breath sounds. No wheezing.  Abdominal:     General: Bowel sounds are normal. There is no distension.      Palpations: Abdomen is soft.     Tenderness: There is no abdominal tenderness.  Musculoskeletal:        General: Normal range of motion.     Cervical back: Normal range of motion and neck supple.  Skin:    General: Skin is warm and dry.  Neurological:     General: No focal deficit present.     Mental Status: She is alert and oriented to person, place, and time.     Cranial Nerves: No cranial nerve deficit.     Motor: No weakness.     Coordination: Coordination normal.     ED Results /  Procedures / Treatments   Labs (all labs ordered are listed, but only abnormal results are displayed) Labs Reviewed  RESPIRATORY PANEL BY RT PCR (FLU A&B, COVID)  COMPREHENSIVE METABOLIC PANEL  CBC WITH DIFFERENTIAL/PLATELET  URINALYSIS, ROUTINE W REFLEX MICROSCOPIC  TROPONIN I (HIGH SENSITIVITY)    EKG None  Radiology No results found.  Procedures Procedures (including critical care time)  Medications Ordered in ED Medications  sodium chloride 0.9 % bolus 1,000 mL (has no administration in time range)  ketorolac (TORADOL) 30 MG/ML injection 30 mg (has no administration in time range)    ED Course  I have reviewed the triage vital signs and the nursing notes.  Pertinent labs & imaging results that were available during my care of the patient were reviewed by me and considered in my medical decision making (see chart for details).    MDM Rules/Calculators/A&P  Patient is a 64 year old female presenting with complaints of weakness and decreased appetite for the past several days.  She denies abdominal pain, chest pain, or specific complaints.  Her physical examination is unremarkable and laboratory studies are basically normal.  Covid test is negative.  I am uncertain as to the exact etiology of her symptoms, however nothing today appears emergent.  Her vitals are stable and physical exam unremarkable.  Patient will be discharged with as needed return/follow-up with primary  doctor.  Final Clinical Impression(s) / ED Diagnoses Final diagnoses:  None    Rx / DC Orders ED Discharge Orders    None       Veryl Speak, MD 08/20/20 [phone]

## 2020-08-20 DIAGNOSIS — I10 Essential (primary) hypertension: Secondary | ICD-10-CM | POA: Diagnosis not present

## 2020-08-20 DIAGNOSIS — R5383 Other fatigue: Secondary | ICD-10-CM | POA: Diagnosis not present

## 2020-08-20 DIAGNOSIS — R531 Weakness: Secondary | ICD-10-CM | POA: Diagnosis not present

## 2020-08-20 LAB — CBC WITH DIFFERENTIAL/PLATELET
Abs Immature Granulocytes: 0 10*3/uL (ref 0.00–0.07)
Basophils Absolute: 0 10*3/uL (ref 0.0–0.1)
Basophils Relative: 0 %
Eosinophils Absolute: 0 10*3/uL (ref 0.0–0.5)
Eosinophils Relative: 0 %
HCT: 30.7 % — ABNORMAL LOW (ref 36.0–46.0)
Hemoglobin: 10.2 g/dL — ABNORMAL LOW (ref 12.0–15.0)
Immature Granulocytes: 0 %
Lymphocytes Relative: 32 %
Lymphs Abs: 1.8 10*3/uL (ref 0.7–4.0)
MCH: 30.2 pg (ref 26.0–34.0)
MCHC: 33.2 g/dL (ref 30.0–36.0)
MCV: 90.8 fL (ref 80.0–100.0)
Monocytes Absolute: 0.3 10*3/uL (ref 0.1–1.0)
Monocytes Relative: 6 %
Neutro Abs: 3.5 10*3/uL (ref 1.7–7.7)
Neutrophils Relative %: 62 %
Platelets: 255 10*3/uL (ref 150–400)
RBC: 3.38 MIL/uL — ABNORMAL LOW (ref 3.87–5.11)
RDW: 12.6 % (ref 11.5–15.5)
WBC: 5.7 10*3/uL (ref 4.0–10.5)
nRBC: 0 % (ref 0.0–0.2)

## 2020-08-20 LAB — RESPIRATORY PANEL BY RT PCR (FLU A&B, COVID)
Influenza A by PCR: NEGATIVE
Influenza B by PCR: NEGATIVE
SARS Coronavirus 2 by RT PCR: NEGATIVE

## 2020-08-20 LAB — COMPREHENSIVE METABOLIC PANEL
ALT: 14 U/L (ref 0–44)
AST: 20 U/L (ref 15–41)
Albumin: 3.9 g/dL (ref 3.5–5.0)
Alkaline Phosphatase: 119 U/L (ref 38–126)
Anion gap: 14 (ref 5–15)
BUN: 17 mg/dL (ref 8–23)
CO2: 23 mmol/L (ref 22–32)
Calcium: 9.3 mg/dL (ref 8.9–10.3)
Chloride: 103 mmol/L (ref 98–111)
Creatinine, Ser: 1.08 mg/dL — ABNORMAL HIGH (ref 0.44–1.00)
GFR calc Af Amer: >60 mL/min (ref >60)
GFR calc non Af Amer: 54 mL/min — ABNORMAL LOW (ref >60)
Glucose, Bld: 108 mg/dL — ABNORMAL HIGH (ref 70–99)
Potassium: 3.6 mmol/L (ref 3.5–5.1)
Sodium: 140 mmol/L (ref 135–145)
Total Bilirubin: 1.1 mg/dL (ref 0.3–1.2)
Total Protein: 8.2 g/dL — ABNORMAL HIGH (ref 6.5–8.1)

## 2020-08-20 LAB — TROPONIN I (HIGH SENSITIVITY): Troponin I (High Sensitivity): 7 ng/L (ref <18)

## 2020-08-20 NOTE — Discharge Instructions (Addendum)
Continue medications as previously prescribed.  Follow-up with your primary doctor if symptoms or not improving in the next few days, and return to the ER if symptoms significantly worsen or change.

## 2020-09-03 DIAGNOSIS — Z1231 Encounter for screening mammogram for malignant neoplasm of breast: Secondary | ICD-10-CM | POA: Diagnosis not present

## 2020-11-13 DIAGNOSIS — R109 Unspecified abdominal pain: Secondary | ICD-10-CM | POA: Diagnosis not present

## 2020-11-13 DIAGNOSIS — E1140 Type 2 diabetes mellitus with diabetic neuropathy, unspecified: Secondary | ICD-10-CM | POA: Diagnosis not present

## 2020-11-13 DIAGNOSIS — Z794 Long term (current) use of insulin: Secondary | ICD-10-CM | POA: Diagnosis not present

## 2020-11-13 DIAGNOSIS — N390 Urinary tract infection, site not specified: Secondary | ICD-10-CM | POA: Diagnosis not present

## 2020-11-13 DIAGNOSIS — R3129 Other microscopic hematuria: Secondary | ICD-10-CM | POA: Diagnosis not present

## 2020-11-19 ENCOUNTER — Other Ambulatory Visit: Payer: Self-pay | Admitting: Endocrinology

## 2020-11-19 DIAGNOSIS — E1042 Type 1 diabetes mellitus with diabetic polyneuropathy: Secondary | ICD-10-CM

## 2020-11-21 ENCOUNTER — Other Ambulatory Visit: Payer: Self-pay | Admitting: Endocrinology

## 2020-11-21 DIAGNOSIS — E1042 Type 1 diabetes mellitus with diabetic polyneuropathy: Secondary | ICD-10-CM

## 2020-12-15 ENCOUNTER — Emergency Department (HOSPITAL_COMMUNITY): Payer: Medicare HMO

## 2020-12-15 ENCOUNTER — Other Ambulatory Visit: Payer: Self-pay

## 2020-12-15 ENCOUNTER — Encounter (HOSPITAL_COMMUNITY): Payer: Self-pay | Admitting: Emergency Medicine

## 2020-12-15 ENCOUNTER — Inpatient Hospital Stay (HOSPITAL_COMMUNITY)
Admission: EM | Admit: 2020-12-15 | Discharge: 2020-12-19 | DRG: 392 | Disposition: A | Discharge status: Home / Self Care | Payer: Medicare HMO | Attending: Internal Medicine | Admitting: Internal Medicine

## 2020-12-15 DIAGNOSIS — R112 Nausea with vomiting, unspecified: Secondary | ICD-10-CM | POA: Diagnosis not present

## 2020-12-15 DIAGNOSIS — Z7982 Long term (current) use of aspirin: Secondary | ICD-10-CM | POA: Diagnosis not present

## 2020-12-15 DIAGNOSIS — K5790 Diverticulosis of intestine, part unspecified, without perforation or abscess without bleeding: Secondary | ICD-10-CM | POA: Diagnosis not present

## 2020-12-15 DIAGNOSIS — Z87440 Personal history of urinary (tract) infections: Secondary | ICD-10-CM | POA: Diagnosis not present

## 2020-12-15 DIAGNOSIS — Z20822 Contact with and (suspected) exposure to covid-19: Secondary | ICD-10-CM | POA: Diagnosis present

## 2020-12-15 DIAGNOSIS — N1832 Chronic kidney disease, stage 3b: Secondary | ICD-10-CM | POA: Diagnosis not present

## 2020-12-15 DIAGNOSIS — K529 Noninfective gastroenteritis and colitis, unspecified: Secondary | ICD-10-CM | POA: Diagnosis not present

## 2020-12-15 DIAGNOSIS — R8281 Pyuria: Secondary | ICD-10-CM | POA: Diagnosis present

## 2020-12-15 DIAGNOSIS — E1065 Type 1 diabetes mellitus with hyperglycemia: Secondary | ICD-10-CM | POA: Diagnosis present

## 2020-12-15 DIAGNOSIS — G40909 Epilepsy, unspecified, not intractable, without status epilepticus: Secondary | ICD-10-CM | POA: Diagnosis present

## 2020-12-15 DIAGNOSIS — E876 Hypokalemia: Secondary | ICD-10-CM | POA: Diagnosis present

## 2020-12-15 DIAGNOSIS — Z79899 Other long term (current) drug therapy: Secondary | ICD-10-CM

## 2020-12-15 DIAGNOSIS — Z87891 Personal history of nicotine dependence: Secondary | ICD-10-CM | POA: Diagnosis not present

## 2020-12-15 DIAGNOSIS — R7989 Other specified abnormal findings of blood chemistry: Secondary | ICD-10-CM | POA: Diagnosis present

## 2020-12-15 DIAGNOSIS — E1022 Type 1 diabetes mellitus with diabetic chronic kidney disease: Secondary | ICD-10-CM | POA: Diagnosis present

## 2020-12-15 DIAGNOSIS — R197 Diarrhea, unspecified: Secondary | ICD-10-CM | POA: Diagnosis not present

## 2020-12-15 DIAGNOSIS — R109 Unspecified abdominal pain: Secondary | ICD-10-CM | POA: Diagnosis not present

## 2020-12-15 DIAGNOSIS — Z833 Family history of diabetes mellitus: Secondary | ICD-10-CM

## 2020-12-15 DIAGNOSIS — I129 Hypertensive chronic kidney disease with stage 1 through stage 4 chronic kidney disease, or unspecified chronic kidney disease: Secondary | ICD-10-CM | POA: Diagnosis not present

## 2020-12-15 DIAGNOSIS — E1042 Type 1 diabetes mellitus with diabetic polyneuropathy: Secondary | ICD-10-CM | POA: Diagnosis present

## 2020-12-15 DIAGNOSIS — F419 Anxiety disorder, unspecified: Secondary | ICD-10-CM | POA: Diagnosis present

## 2020-12-15 DIAGNOSIS — R748 Abnormal levels of other serum enzymes: Secondary | ICD-10-CM | POA: Diagnosis present

## 2020-12-15 DIAGNOSIS — Z885 Allergy status to narcotic agent status: Secondary | ICD-10-CM | POA: Diagnosis not present

## 2020-12-15 DIAGNOSIS — F32A Depression, unspecified: Secondary | ICD-10-CM | POA: Diagnosis present

## 2020-12-15 DIAGNOSIS — E785 Hyperlipidemia, unspecified: Secondary | ICD-10-CM | POA: Diagnosis not present

## 2020-12-15 DIAGNOSIS — Z794 Long term (current) use of insulin: Secondary | ICD-10-CM

## 2020-12-15 DIAGNOSIS — Z8249 Family history of ischemic heart disease and other diseases of the circulatory system: Secondary | ICD-10-CM

## 2020-12-15 DIAGNOSIS — Z23 Encounter for immunization: Secondary | ICD-10-CM

## 2020-12-15 DIAGNOSIS — I10 Essential (primary) hypertension: Secondary | ICD-10-CM | POA: Diagnosis not present

## 2020-12-15 DIAGNOSIS — R1110 Vomiting, unspecified: Secondary | ICD-10-CM | POA: Diagnosis not present

## 2020-12-15 LAB — CBC
HCT: 33.1 % — ABNORMAL LOW (ref 36.0–46.0)
Hemoglobin: 11.2 g/dL — ABNORMAL LOW (ref 12.0–15.0)
MCH: 29.7 pg (ref 26.0–34.0)
MCHC: 33.8 g/dL (ref 30.0–36.0)
MCV: 87.8 fL (ref 80.0–100.0)
Platelets: 272 10*3/uL (ref 150–400)
RBC: 3.77 MIL/uL — ABNORMAL LOW (ref 3.87–5.11)
RDW: 12.5 % (ref 11.5–15.5)
WBC: 4.9 10*3/uL (ref 4.0–10.5)
nRBC: 0 % (ref 0.0–0.2)

## 2020-12-15 LAB — URINALYSIS, ROUTINE W REFLEX MICROSCOPIC
Bilirubin Urine: NEGATIVE
Glucose, UA: 50 mg/dL — AB
Ketones, ur: 20 mg/dL — AB
Nitrite: NEGATIVE
Protein, ur: 100 mg/dL — AB
Specific Gravity, Urine: 1.031 — ABNORMAL HIGH (ref 1.005–1.030)
pH: 5 (ref 5.0–8.0)

## 2020-12-15 LAB — COMPREHENSIVE METABOLIC PANEL
ALT: 13 U/L (ref 0–44)
AST: 17 U/L (ref 15–41)
Albumin: 3.5 g/dL (ref 3.5–5.0)
Alkaline Phosphatase: 140 U/L — ABNORMAL HIGH (ref 38–126)
Anion gap: 13 (ref 5–15)
BUN: 16 mg/dL (ref 8–23)
CO2: 21 mmol/L — ABNORMAL LOW (ref 22–32)
Calcium: 9.2 mg/dL (ref 8.9–10.3)
Chloride: 104 mmol/L (ref 98–111)
Creatinine, Ser: 1.28 mg/dL — ABNORMAL HIGH (ref 0.44–1.00)
GFR, Estimated: 47 mL/min — ABNORMAL LOW (ref >60)
Glucose, Bld: 201 mg/dL — ABNORMAL HIGH (ref 70–99)
Potassium: 3.2 mmol/L — ABNORMAL LOW (ref 3.5–5.1)
Sodium: 138 mmol/L (ref 135–145)
Total Bilirubin: 0.7 mg/dL (ref 0.3–1.2)
Total Protein: 7.7 g/dL (ref 6.5–8.1)

## 2020-12-15 LAB — SARS CORONAVIRUS 2 (TAT 6-24 HRS): SARS Coronavirus 2: NEGATIVE

## 2020-12-15 LAB — CBG MONITORING, ED: Glucose-Capillary: 157 mg/dL — ABNORMAL HIGH (ref 70–99)

## 2020-12-15 LAB — LIPASE, BLOOD: Lipase: 44 U/L (ref 11–51)

## 2020-12-15 MED ORDER — AMLODIPINE BESYLATE 5 MG PO TABS
5.0000 mg | ORAL_TABLET | Freq: Once | ORAL | Status: AC
  Administered 2020-12-15: 5 mg via ORAL
  Filled 2020-12-15: qty 1

## 2020-12-15 MED ORDER — AMOXICILLIN-POT CLAVULANATE 875-125 MG PO TABS: 1.0000 | ORAL_TABLET | Freq: Three times a day (TID) | ORAL | 0 refills | Status: DC

## 2020-12-15 MED ORDER — METOPROLOL SUCCINATE ER 25 MG PO TB24
25.0000 mg | ORAL_TABLET | Freq: Every day | ORAL | Status: DC
  Administered 2020-12-16 – 2020-12-18 (×4): 25 mg via ORAL
  Filled 2020-12-15 (×4): qty 1

## 2020-12-15 MED ORDER — SODIUM CHLORIDE 0.9 % IV BOLUS
1000.0000 mL | Freq: Once | INTRAVENOUS | Status: AC
  Administered 2020-12-15: 1000 mL via INTRAVENOUS

## 2020-12-15 MED ORDER — ONDANSETRON HCL 4 MG PO TABS: 4.0000 mg | ORAL_TABLET | Freq: Four times a day (QID) | ORAL | 0 refills | Status: DC

## 2020-12-15 MED ORDER — ATORVASTATIN CALCIUM 40 MG PO TABS
40.0000 mg | ORAL_TABLET | Freq: Every day | ORAL | Status: DC
  Administered 2020-12-16 – 2020-12-19 (×4): 40 mg via ORAL
  Filled 2020-12-15 (×3): qty 1
  Filled 2020-12-15: qty 4
  Filled 2020-12-15: qty 1

## 2020-12-15 MED ORDER — AMLODIPINE BESYLATE 10 MG PO TABS
10.0000 mg | ORAL_TABLET | Freq: Every day | ORAL | Status: DC
  Administered 2020-12-16 – 2020-12-19 (×5): 10 mg via ORAL
  Filled 2020-12-15: qty 2
  Filled 2020-12-15: qty 1
  Filled 2020-12-15: qty 2
  Filled 2020-12-15 (×2): qty 1

## 2020-12-15 MED ORDER — GABAPENTIN 100 MG PO CAPS
100.0000 mg | ORAL_CAPSULE | Freq: Three times a day (TID) | ORAL | Status: DC
  Administered 2020-12-16 – 2020-12-19 (×10): 100 mg via ORAL
  Filled 2020-12-15 (×10): qty 1

## 2020-12-15 MED ORDER — POTASSIUM CHLORIDE 10 MEQ/100ML IV SOLN
10.0000 meq | Freq: Once | INTRAVENOUS | Status: AC
  Administered 2020-12-15: 10 meq via INTRAVENOUS
  Filled 2020-12-15: qty 100

## 2020-12-15 MED ORDER — AMITRIPTYLINE HCL 25 MG PO TABS: 50.0000 mg | ORAL_TABLET | Freq: Every day | ORAL | Status: DC

## 2020-12-15 MED ORDER — SERTRALINE HCL 50 MG PO TABS
50.0000 mg | ORAL_TABLET | Freq: Every day | ORAL | Status: DC
  Administered 2020-12-16 – 2020-12-19 (×4): 50 mg via ORAL
  Filled 2020-12-15 (×4): qty 1

## 2020-12-15 MED ORDER — INSULIN GLARGINE 100 UNIT/ML ~~LOC~~ SOLN
5.0000 [IU] | Freq: Every day | SUBCUTANEOUS | Status: DC
  Administered 2020-12-16 – 2020-12-17 (×3): 5 [IU] via SUBCUTANEOUS
  Filled 2020-12-15 (×4): qty 0.05

## 2020-12-15 MED ORDER — ASPIRIN EC 81 MG PO TBEC
81.0000 mg | DELAYED_RELEASE_TABLET | Freq: Every day | ORAL | Status: DC
  Administered 2020-12-16 – 2020-12-19 (×4): 81 mg via ORAL
  Filled 2020-12-15 (×5): qty 1

## 2020-12-15 MED ORDER — INSULIN ASPART 100 UNIT/ML ~~LOC~~ SOLN: 0.0000 [IU] | Freq: Every day | SUBCUTANEOUS | Status: DC

## 2020-12-15 MED ORDER — LORAZEPAM 2 MG/ML IJ SOLN
0.5000 mg | Freq: Once | INTRAMUSCULAR | Status: AC
  Administered 2020-12-15: 0.5 mg via INTRAVENOUS
  Filled 2020-12-15: qty 1

## 2020-12-15 MED ORDER — ONDANSETRON HCL 4 MG/2ML IJ SOLN: 4.0000 mg | Freq: Once | INTRAMUSCULAR | Status: DC

## 2020-12-15 MED ORDER — IOHEXOL 300 MG/ML  SOLN
100.0000 mL | Freq: Once | INTRAMUSCULAR | Status: AC | PRN
  Administered 2020-12-15: 100 mL via INTRAVENOUS

## 2020-12-15 MED ORDER — INSULIN ASPART 100 UNIT/ML ~~LOC~~ SOLN
0.0000 [IU] | Freq: Three times a day (TID) | SUBCUTANEOUS | Status: DC
  Administered 2020-12-16: 1 [IU] via SUBCUTANEOUS

## 2020-12-15 MED ORDER — ONDANSETRON HCL 4 MG/2ML IJ SOLN
4.0000 mg | Freq: Once | INTRAMUSCULAR | Status: AC
  Administered 2020-12-15: 4 mg via INTRAVENOUS
  Filled 2020-12-15: qty 2

## 2020-12-15 MED ORDER — SODIUM CHLORIDE 0.9 % IV BOLUS
500.0000 mL | Freq: Once | INTRAVENOUS | Status: AC
  Administered 2020-12-15: 500 mL via INTRAVENOUS

## 2020-12-15 MED ORDER — AMOXICILLIN-POT CLAVULANATE 875-125 MG PO TABS
1.0000 | ORAL_TABLET | Freq: Once | ORAL | Status: DC
  Filled 2020-12-15: qty 1

## 2020-12-15 MED ORDER — PIPERACILLIN-TAZOBACTAM 3.375 G IVPB
3.3750 g | Freq: Three times a day (TID) | INTRAVENOUS | Status: DC
  Administered 2020-12-16 – 2020-12-19 (×11): 3.375 g via INTRAVENOUS
  Filled 2020-12-15 (×11): qty 50

## 2020-12-15 MED ORDER — POTASSIUM CHLORIDE IN NACL 40-0.9 MEQ/L-% IV SOLN
INTRAVENOUS | Status: AC
  Filled 2020-12-15 (×4): qty 1000

## 2020-12-15 MED ORDER — MORPHINE SULFATE (PF) 4 MG/ML IV SOLN
4.0000 mg | Freq: Once | INTRAVENOUS | Status: AC
  Administered 2020-12-15: 4 mg via INTRAVENOUS
  Filled 2020-12-15: qty 1

## 2020-12-15 NOTE — ED Provider Notes (Addendum)
Care assumed from M. Venter PA-C at shift change pending CT AP, UA.  See her note for full H&P.   Briefly this is 65 yo female presenting with generalized weakness, abdominal pain, nausea, emesis, diarrhea x 3 days. Work up by previous provider significant for mild hypokalemia 3.2, creatinine consistent with baseline 1.28 with nl BUN. Covid test in process. Given IV potassium, analgesic and antiemetic and 1 L IVF.  Physical Exam  BP (!) 166/95   Pulse 79   Temp 98.8 F (37.1 C) (Oral)   Resp 14   SpO2 95%   Physical Exam  PE: Constitutional: Ill appearing HENT: normocephalic, atraumatic. no cervical adenopathy Cardiovascular: normal rate and rhythm, distal pulses intact Pulmonary/Chest: effort normal; breath sounds clear and equal bilaterally; no wheezes or rales Abdominal: generalized abdominal pain without peritoneal signs Musculoskeletal: full ROM, no edema Neurological: alert with goal directed thinking Skin: warm and dry, no rash, no diaphoresis Psychiatric: normal mood and affect, normal behavior    ED Course/Procedures     Procedures  Results for orders placed or performed during the hospital encounter of 12/15/20  SARS CORONAVIRUS 2 (TAT 6-24 HRS) Nasopharyngeal Nasopharyngeal Swab   Specimen: Nasopharyngeal Swab  Result Value Ref Range   SARS Coronavirus 2 NEGATIVE NEGATIVE  Lipase, blood  Result Value Ref Range   Lipase 44 11 - 51 U/L  Comprehensive metabolic panel  Result Value Ref Range   Sodium 138 135 - 145 mmol/L   Potassium 3.2 (L) 3.5 - 5.1 mmol/L   Chloride 104 98 - 111 mmol/L   CO2 21 (L) 22 - 32 mmol/L   Glucose, Bld 201 (H) 70 - 99 mg/dL   BUN 16 8 - 23 mg/dL   Creatinine, Ser 1.28 (H) 0.44 - 1.00 mg/dL   Calcium 9.2 8.9 - 10.3 mg/dL   Total Protein 7.7 6.5 - 8.1 g/dL   Albumin 3.5 3.5 - 5.0 g/dL   AST 17 15 - 41 U/L   ALT 13 0 - 44 U/L   Alkaline Phosphatase 140 (H) 38 - 126 U/L   Total Bilirubin 0.7 0.3 - 1.2 mg/dL   GFR, Estimated 47 (L)  >60 mL/min   Anion gap 13 5 - 15  CBC  Result Value Ref Range   WBC 4.9 4.0 - 10.5 K/uL   RBC 3.77 (L) 3.87 - 5.11 MIL/uL   Hemoglobin 11.2 (L) 12.0 - 15.0 g/dL   HCT 33.1 (L) 36.0 - 46.0 %   MCV 87.8 80.0 - 100.0 fL   MCH 29.7 26.0 - 34.0 pg   MCHC 33.8 30.0 - 36.0 g/dL   RDW 12.5 11.5 - 15.5 %   Platelets 272 150 - 400 K/uL   nRBC 0.0 0.0 - 0.2 %  Urinalysis, Routine w reflex microscopic  Result Value Ref Range   Color, Urine YELLOW YELLOW   APPearance HAZY (A) CLEAR   Specific Gravity, Urine 1.031 (H) 1.005 - 1.030   pH 5.0 5.0 - 8.0   Glucose, UA 50 (A) NEGATIVE mg/dL   Hgb urine dipstick SMALL (A) NEGATIVE   Bilirubin Urine NEGATIVE NEGATIVE   Ketones, ur 20 (A) NEGATIVE mg/dL   Protein, ur 100 (A) NEGATIVE mg/dL   Nitrite NEGATIVE NEGATIVE   Leukocytes,Ua LARGE (A) NEGATIVE   RBC / HPF 11-20 0 - 5 RBC/hpf   WBC, UA 6-10 0 - 5 WBC/hpf   Bacteria, UA RARE (A) NONE SEEN   Squamous Epithelial / LPF 11-20 0 - 5  Mucus PRESENT   CBG monitoring, ED  Result Value Ref Range   Glucose-Capillary 157 (H) 70 - 99 mg/dL   CT Abdomen Pelvis W Contrast  Result Date: 12/15/2020 CLINICAL DATA:  Abdominal pain and fever. Generalized weakness. Nausea, vomiting and diarrhea. EXAM: CT ABDOMEN AND PELVIS WITH CONTRAST TECHNIQUE: Multidetector CT imaging of the abdomen and pelvis was performed using the standard protocol following bolus administration of intravenous contrast. CONTRAST:  150mL OMNIPAQUE IOHEXOL 300 MG/ML  SOLN COMPARISON:  CT 11/02/2019 FINDINGS: Lower chest: No acute or focal airspace disease. No pleural fluid. Heart is normal in size. Hepatobiliary: No focal hepatic abnormality. Partially distended gallbladder without calcified gallstone or pericholecystic inflammation. Common bile duct upper normal at the porta hepatis measuring 7 mm, but normal tapering. Pancreas: No ductal dilatation or inflammation. Spleen: Normal in size without focal abnormality. Tiny splenule anteriorly.  Adrenals/Urinary Tract: No adrenal nodule. No hydronephrosis or perinephric edema. Homogeneous renal enhancement with symmetric excretion on delayed phase imaging. There is scattered subcentimeter low-density lesions throughout both kidneys that are too small to characterize, but not significantly changed from prior exam. This includes a low-density lesion in the lower right kidney measuring 6 mm, series 8, image 22. Urinary bladder is physiologically distended without wall thickening. Stomach/Bowel: Bowel evaluation is limited in the absence of enteric contrast. Decompressed stomach, not well assessed. Normal positioning of the duodenum and ligament of Treitz. Decompressed small bowel without obstruction. No small bowel inflammation. Normal air-filled appendix in the right lower quadrant. Suggestion of colonic wall thickening from the hepatic flexure of the colon distally. Formed stool in the ascending colon, otherwise decompressed colon. Minimal sigmoid diverticulosis without diverticulitis. Vascular/Lymphatic: Abdominal aorta is normal in caliber. Patent portal vein. No acute vascular findings. No enlarged lymph nodes in the abdomen or pelvis. Reproductive: Hysterectomy.  Quiescent ovaries.  No adnexal mass. Other: No ascites, free air or focal fluid collection. Musculoskeletal: There are no acute or suspicious osseous abnormalities. IMPRESSION: 1. Suggestion of colonic wall thickening from the hepatic flexure of the colon distally, suspicious for colitis. 2. Minimal sigmoid diverticulosis without diverticulitis. Electronically Signed   By: Keith Rake M.D.   On: 12/15/2020 17:23     MDM  Please see previous provider note to include MDM up to this point. Patient received in sign out at 4 pm.  Covid test is negative. UA with large leukocytes, 6-10 WBC, rare bacteria. Also noted to have squamous epithelial cells. Urine culture sent. Will hold off on antibiotic as she denies urinary complaints and  possible contaminated sample.   CT AP shows colonic wall thickening from hepatic flexure of the colon distally suspicious for colitis.  Minimal sigmoid diverticulosis without diverticulitis.  Possible viral etiology as she is afebrile and has no leukocytosis however will cover for possible infection given she is immunocompromise with augmentin which would cover both colitis and diverticulitis given CT scan with findings of both colitis and diverticulosis. Findings and plan of care discussed with supervising physician Dr. Eulis Foster who agrees with plan PO challenge unsuccessful with apple juice. Patient given low dose of ativan.   Reassessed patient after Ativan and she is still unable to tolerate PO intake.  Patient does not feel she can manage symptoms at home as she cannot tolerate p.o. intake to take her medications.  Spoke with Dr. Nevada Crane with hospitalist service who agrees to assume care of patient and bring into the hospital for further evaluation and management. Appreciate assistance with admission.    Portions of this  note were generated with [employer]. Dictation errors may occur despite best attempts at proofreading.      Barrie Folk, PA-C 12/15/20 2213    Barrie Folk, PA-C 12/15/20 2259    Daleen Bo, MD 12/16/20 [phone]

## 2020-12-15 NOTE — ED Provider Notes (Signed)
  Face-to-face evaluation   History: Patient presents for evaluation of several days of nausea, vomiting and diarrhea.  No vomiting or diarrhea today.  She has mild pain in the abdomen, noted with palpation, and after eating.  No documented fever at home.  No sore problems past.  Physical exam: Elderly female alert and cooperative.  She appears mildly uncomfortable.  Abdomen is nontender with palpation.  Nontoxic appearance  Medical screening examination/treatment/procedure(s) were conducted as a shared visit with non-physician practitioner(s) and myself.  I personally evaluated the patient during the encounter    Daleen Bo, MD 12/15/20 2050

## 2020-12-15 NOTE — Discharge Instructions (Addendum)
CT scan shows you have colitis and diverticulosis.  -Prescription sent to your pharmacy for Augmentin. This is an antibiotic. Please take as prescribed. It can cause diarrhea so you should ask the pharmacist about an over-the-counter probiotic when you pick it up to take with it.   -Prescription sent to the pharmacy for Zofran. This is nausea medicine take as prescribed.  -Follow-up with primary care doctor early next week for symptom recheck.   Return to the emergency department if you have any worsening symptoms.

## 2020-12-15 NOTE — ED Notes (Signed)
Pt n/v after PO intake, c/o pain 10/10.  PA made aware.

## 2020-12-15 NOTE — ED Provider Notes (Signed)
[facility] EMERGENCY DEPARTMENT Provider Note   CSN: [id number] Arrival date & time: 12/15/20  1110     History No chief complaint on file.   Tricia Huynh is a 65 y.o. female with PMHx HTN, Diabetes who presents to the ED today with complaint of gradual onset, constant, diffuse abdominal pain x 3 days with associated nausea, NBNB emesis, and diarrhea. Pt reports that she has been unable to keep anything down including water prompting her to come to the ED today.  She is mention she is also had a fever up to 101 degrees 2 days ago.  She denies any recent sick contacts.  She is vaccinated against COVID-19.  Denies headache, cough, shortness of breath, chest pain, urinary symptoms.  Previous abdominal surgery includes hysterectomy.  Denies recent suspicious food intake or foreign travel.  He does mention she was treated for a UTI about 4 weeks ago, cannot recall the name of the medication.  Did not start having diarrhea until 3 days ago.  The history is provided by the patient and medical records.       Past Medical History:  Diagnosis Date  . Diabetes mellitus type 2 in nonobese ([place]) 06/06/2015  . Diabetes mellitus without complication ([place])   . Diabetic neuropathy ([place]) 06/06/2015  . Dyslipidemia 06/06/2015  . Encephalopathy   . Essential hypertension 06/06/2015  . Hypertension   . Mood disorder ([place]) 06/06/2015  . Pneumonia 03/11/2017  . Seizure [facility])    sees [name]. abd eeg, on keppra    Patient Active Problem List   Diagnosis Date Noted  . HCAP (healthcare-associated pneumonia) 03/11/2017  . Dyslipidemia 03/11/2017  . Diabetes mellitus with complication ([place])   . Chest pain 12/30/2016  . Seizure disorder ([place]) 12/30/2016  . Diabetic ketoacidosis without coma associated with type 2 diabetes mellitus ([place])   . DKA (diabetic ketoacidoses) 11/15/2016  . UTI (urinary tract infection) 11/15/2016  . Acute cystitis without hematuria   . Acute renal failure  ([place])   . Hyperglycemia 11/14/2016  . Diabetes ([place]) 06/05/2016  . Altered mental status   . CKD (chronic kidney disease) stage 3, GFR 30-59 ml/min (HCC) 05/26/2016  . Benign essential HTN 05/26/2016  . Seizures ([place]) 01/08/2016  . Memory loss 10/04/2015  . Acute encephalopathy 06/06/2015  . Diabetic neuropathy ([place]) 06/06/2015    Past Surgical History:  Procedure Laterality Date  . ABDOMINAL HYSTERECTOMY    . EYE SURGERY Bilateral   . TUBAL LIGATION       OB History   No obstetric history on file.     Family History  Problem Relation Age of Onset  . Heart disease Father   . Kidney disease Father   . Diabetes Mother   . Seizures Sister   . Seizures Brother     Social History   Tobacco Use  . Smoking status: Former Smoker    Types: Cigarettes  . Smokeless tobacco: Never Used  Vaping Use  . Vaping Use: Never used  Substance Use Topics  . Alcohol use: No  . Drug use: No    Home Medications Prior to Admission medications   Medication Sig Start Date End Date Taking? Authorizing Provider  Accu-Chek Softclix Lancets lancets 1 each by Other route 2 (two) times daily. E11.9 12/20/19   Renato Shin, MD  acetaminophen (TYLENOL) 500 MG tablet Take 1,000 mg by mouth every 6 (six) hours as needed for mild pain or headache.     [provider]  amitriptyline (ELAVIL) 50 MG tablet Take 50 mg by mouth at bedtime. 05/08/15   [provider]  amLODipine-olmesartan (AZOR) 5-40 MG tablet Take 1 tablet by mouth daily. 10/08/16   [provider]  aspirin EC 81 MG tablet Take 81 mg by mouth daily.    [provider]  atorvastatin (LIPITOR) 40 MG tablet Take 40 mg by mouth daily. 12/23/19   [provider]  Blood Glucose Monitoring Suppl (ACCU-CHEK AVIVA PLUS) w/Device KIT 1 each by Does not apply route 2 (two) times daily. E11.9 12/20/19   Renato Shin, MD  cephALEXin (KEFLEX) 500 MG capsule Take 1 capsule (500 mg total) by mouth 4 (four) times  daily. 02/17/20   Mesner, Corene Cornea, MD  gabapentin (NEURONTIN) 100 MG capsule Take 100 mg by mouth 3 (three) times daily. 12/23/19   [provider]  glucose blood (ACCU-CHEK AVIVA PLUS) test strip 1 each by Other route 2 (two) times daily. E11.9 12/20/19   Renato Shin, MD  hydrochlorothiazide (HYDRODIURIL) 25 MG tablet Take 25 mg by mouth daily. 01/13/19   [provider]  Insulin Glargine (LANTUS SOLOSTAR) 100 UNIT/ML Solostar Pen Inject 24 Units into the skin every morning. And pen needles 1/day Patient taking differently: Inject 24 Units into the skin daily. And pen needles 1/day 12/20/19   Renato Shin, MD  metoprolol succinate (TOPROL-XL) 50 MG 24 hr tablet Take 50 mg by mouth at bedtime.  05/08/15   [provider]  ondansetron (ZOFRAN ODT) 4 MG disintegrating tablet Take 1 tablet (4 mg total) by mouth every 8 (eight) hours as needed for nausea or vomiting. 07/25/20   Volney American, PA-C  sertraline (ZOLOFT) 50 MG tablet Take 50 mg by mouth daily. 01/07/19   [provider]  vitamin E 400 UNIT capsule Take 400 Units by mouth daily.    [provider]  levETIRAcetam (KEPPRA) 750 MG tablet Take one tablet by mouth twice daily.  Please call [phone] to schedule yearly follow up appt. Patient not taking: Reported on 01/10/2020 08/04/19 02/17/20  Marcial Pacas, MD    Allergies    Percocet [oxycodone-acetaminophen] and Vicodin [hydrocodone-acetaminophen]  Review of Systems   Review of Systems  Constitutional: Positive for chills, fatigue and fever.  Respiratory: Negative for cough.   Cardiovascular: Negative for chest pain.  Gastrointestinal: Positive for abdominal pain, diarrhea, nausea and vomiting.  All other systems reviewed and are negative.   Physical Exam Updated Vital Signs BP (!) 191/109 (BP Location: Right Arm)   Pulse 83   Temp 98.8 F (37.1 C) (Oral)   Resp (!) 9   SpO2 100%   Physical Exam Vitals and nursing note reviewed.   Constitutional:      Appearance: She is ill-appearing. She is not diaphoretic.  HENT:     Head: Normocephalic and atraumatic.  Eyes:     Conjunctiva/sclera: Conjunctivae normal.  Cardiovascular:     Rate and Rhythm: Normal rate and regular rhythm.     Pulses: Normal pulses.  Pulmonary:     Effort: Pulmonary effort is normal.     Breath sounds: Normal breath sounds. No wheezing, rhonchi or rales.  Abdominal:     Palpations: Abdomen is soft.     Tenderness: There is abdominal tenderness. There is guarding. There is no rebound.     Comments: Soft, diffuse TTP with voluntary guarding, +BS throughout, neg murphy's, neg mcburney's, no CVA TTP  Musculoskeletal:     Cervical back: Neck supple.  Skin:  General: Skin is warm and dry.  Neurological:     Mental Status: She is alert.     ED Results / Procedures / Treatments   Labs (all labs ordered are listed, but only abnormal results are displayed) Labs Reviewed  COMPREHENSIVE METABOLIC PANEL - Abnormal; Notable for the following components:      Result Value   Potassium 3.2 (*)    CO2 21 (*)    Glucose, Bld 201 (*)    Creatinine, Ser 1.28 (*)    Alkaline Phosphatase 140 (*)    GFR, Estimated 47 (*)    All other components within normal limits  CBC - Abnormal; Notable for the following components:   RBC 3.77 (*)    Hemoglobin 11.2 (*)    HCT 33.1 (*)    All other components within normal limits  CBG MONITORING, ED - Abnormal; Notable for the following components:   Glucose-Capillary 157 (*)    All other components within normal limits  SARS CORONAVIRUS 2 (TAT 6-24 HRS)  LIPASE, BLOOD  URINALYSIS, ROUTINE W REFLEX MICROSCOPIC  LACTIC ACID, PLASMA  LACTIC ACID, PLASMA    EKG None  Radiology No results found.  Procedures Procedures   Medications Ordered in ED Medications  potassium chloride 10 mEq in 100 mL IVPB (has no administration in time range)  sodium chloride 0.9 % bolus 1,000 mL (1,000 mLs Intravenous  New Bag/Given 12/15/20 1429)  ondansetron (ZOFRAN) injection 4 mg (4 mg Intravenous Given 12/15/20 1430)  morphine 4 MG/ML injection 4 mg (4 mg Intravenous Given 12/15/20 1431)    ED Course  I have reviewed the triage vital signs and the nursing notes.  Pertinent labs & imaging results that were available during my care of the patient were reviewed by me and considered in my medical decision making (see chart for details).    MDM Rules/Calculators/A&P                          65 year old female who presents the ED today with complaint of diffuse abdominal pain, fevers, nausea, vomiting, diarrhea for the past 3 days.  No recent sick contacts, is vaccinated against COVID-19 with two doses.  No booster.  Does mention that she was on antibiotics about 1 month ago however only on it for 5 days and did not start having symptoms until approximately 3 days ago, I have very low suspicion for C. difficile at this time as patient does not report excessive diarrhea.  Her main concern is vomiting at this time which is nonbloody and nonbilious.  On arrival to the ED patient is afebrile and tachypneic however tachycardic at the 110s.  Suspect likely to dehydration.  Blood pressure elevated as well at 180/112.  Patient states she has been unable to keep her blood pressure medications down due to excessive vomiting.  On exam patient has diffuse abdominal tenderness palpation with voluntary guarding.  Given diffuse pain, patient's age, reported fevers we will plan for CT scan.  Lab work was obtained while patient was in the waiting room including a CBC without leukocytosis and a stable hemoglobin at 11.2.  CMP with potassium 3.2, will replete with IV potassium.  Glucose 201 and bicarb twenty-one, no gap.  Lower suspicion for DKA at this time.  Creatinine elevated at 1.28 however does appear to be around patient's baseline.  Lipase unremarkable at forty-four.  Lab Results  Component Value Date   CREATININE 1.28 (H)  12/15/2020  CREATININE 1.08 (H) 08/20/2020   CREATININE 1.30 (H) 02/16/2020   Will provide fluids, Zofran, pain medication and plan for CT abdomen and pelvis. Discussed case with attending physician Dr. Eulis Foster who will evaluate patient; questions mesenteric ischemia being possibility - pt without bloody stools or leukocytosis; will add on lactic acid for screening at this time  At shift change case signed out to Milinda Cave, PA-C, who will dispo patient accordingly after CT scan and lactic acid have returned. Pt will need to be fluid challenged prior to discharge.   This note was prepared using Dragon voice recognition software and may include unintentional dictation errors due to the inherent limitations of voice recognition software.   Final Clinical Impression(s) / ED Diagnoses Final diagnoses:  Nausea vomiting and diarrhea    Rx / DC Orders ED Discharge Orders    None       Eustaquio Maize, PA-C 12/15/20 1653    Daleen Bo, MD 12/15/20 2051

## 2020-12-15 NOTE — ED Triage Notes (Addendum)
C/o generalized weakness, generalized abd pain, nausea, vomiting, and diarrhea x 3 days.

## 2020-12-15 NOTE — ED Notes (Signed)
Pt is aware we need a urine sample.

## 2020-12-15 NOTE — ED Notes (Signed)
Pt is laying on arm with the blood pressure cuff.

## 2020-12-15 NOTE — H&P (Addendum)
History and Physical  Allsion Nogales WVP:[id number] DOB: Jul 20, 1956 DOA: 12/15/2020  Referring physician: Sherol Dade, PA, EDP. PCP: Jonathon Jordan, MD  Outpatient Specialists: None. Patient coming from: Home.  Chief Complaint: Nausea vomiting diarrhea.    HPI: Lasharon Dunivan is a 65 y.o. female with medical history significant for type 1 diabetes, diabetic polyneuropathy, hyperlipidemia, essential hypertension, mood disorder, seizure disorder, who presented to [facility] ED with complaints of nausea vomiting and diarrhea of 3 days duration.  Associated with generalized abdominal pain.  CT abdomen and pelvis with contrast done in the ED showed findings suggestive of colonic wall thickening of the hepatic flexure of the colon distally, suspicious for colitis.  Minimal sigmoid diverticulosis without diverticulitis.  She was started on Augmentin in the ED but was unable to tolerate oral intake due to persistent nausea and vomiting.  TRH asked to admit.  ED Course:  Afebrile, BP 180/112, heart rate 111, respiratory rate 18, O2 saturation 100% on room air.  Lab studies remarkable for serum potassium 3.2, serum bicarb 21, anion gap 13, creatinine 1.28 with baseline creatinine of 1.0, alkaline phosphatase 140, lipase 44.  No leukocytosis, hemoglobin 11.2K, at baseline.  UA positive for pyuria.  Review of Systems: Review of systems as noted in the HPI. All other systems reviewed and are negative.   Past Medical History:  Diagnosis Date  . Diabetes mellitus type 2 in nonobese ([place]) 06/06/2015  . Diabetes mellitus without complication ([place])   . Diabetic neuropathy ([place]) 06/06/2015  . Dyslipidemia 06/06/2015  . Encephalopathy   . Essential hypertension 06/06/2015  . Hypertension   . Mood disorder ([place]) 06/06/2015  . Pneumonia 03/11/2017  . Seizure [facility])    sees [name]. abd eeg, on keppra   Past Surgical History:  Procedure Laterality Date  . ABDOMINAL HYSTERECTOMY    . EYE SURGERY  Bilateral   . TUBAL LIGATION      Social History:  reports that she has quit smoking. Her smoking use included cigarettes. She has never used smokeless tobacco. She reports that she does not drink alcohol and does not use drugs.   Allergies  Allergen Reactions  . Percocet [Oxycodone-Acetaminophen] Nausea And Vomiting and Other (See Comments)    Causes the patient to be shaky/unsteady, also  . Vicodin [Hydrocodone-Acetaminophen] Nausea And Vomiting and Other (See Comments)    Causes the patient to be shaky/unsteady, also    Family History  Problem Relation Age of Onset  . Heart disease Father   . Kidney disease Father   . Diabetes Mother   . Seizures Sister   . Seizures Brother       Prior to Admission medications   Medication Sig Start Date End Date Taking? Authorizing Provider  amoxicillin-clavulanate (AUGMENTIN) 875-125 MG tablet Take 1 tablet by mouth every 8 (eight) hours for 7 days. 12/15/20 12/22/20 Yes Walisiewicz, Kaitlyn E, PA-C  ondansetron (ZOFRAN) 4 MG tablet Take 1 tablet (4 mg total) by mouth every 6 (six) hours. 12/15/20  Yes Walisiewicz, Kaitlyn E, PA-C  Accu-Chek Softclix Lancets lancets 1 each by Other route 2 (two) times daily. E11.9 12/20/19   Renato Shin, MD  acetaminophen (TYLENOL) 500 MG tablet Take 1,000 mg by mouth every 6 (six) hours as needed for mild pain or headache.     [provider]  amitriptyline (ELAVIL) 50 MG tablet Take 50 mg by mouth at bedtime. 05/08/15   [provider]  amLODipine-olmesartan (AZOR) 5-40 MG tablet Take 1 tablet by mouth daily. 10/08/16  [provider]  aspirin EC 81 MG tablet Take 81 mg by mouth daily.    [provider]  atorvastatin (LIPITOR) 40 MG tablet Take 40 mg by mouth daily. 12/23/19   [provider]  Blood Glucose Monitoring Suppl (ACCU-CHEK AVIVA PLUS) w/Device KIT 1 each by Does not apply route 2 (two) times daily. E11.9 12/20/19   Renato Shin, MD  gabapentin (NEURONTIN)  100 MG capsule Take 100 mg by mouth 3 (three) times daily. 12/23/19   [provider]  glucose blood (ACCU-CHEK AVIVA PLUS) test strip 1 each by Other route 2 (two) times daily. E11.9 12/20/19   Renato Shin, MD  hydrochlorothiazide (HYDRODIURIL) 25 MG tablet Take 25 mg by mouth daily. 01/13/19   [provider]  Insulin Glargine (LANTUS SOLOSTAR) 100 UNIT/ML Solostar Pen Inject 24 Units into the skin every morning. And pen needles 1/day Patient taking differently: Inject 24 Units into the skin daily. And pen needles 1/day 12/20/19   Renato Shin, MD  metoprolol succinate (TOPROL-XL) 50 MG 24 hr tablet Take 50 mg by mouth at bedtime.  05/08/15   [provider]  ondansetron (ZOFRAN ODT) 4 MG disintegrating tablet Take 1 tablet (4 mg total) by mouth every 8 (eight) hours as needed for nausea or vomiting. 07/25/20   Volney American, PA-C  sertraline (ZOLOFT) 50 MG tablet Take 50 mg by mouth daily. 01/07/19   [provider]  vitamin E 400 UNIT capsule Take 400 Units by mouth daily.    [provider]  levETIRAcetam (KEPPRA) 750 MG tablet Take one tablet by mouth twice daily.  Please call [phone] to schedule yearly follow up appt. Patient not taking: Reported on 01/10/2020 08/04/19 02/17/20  Marcial Pacas, MD    Physical Exam: BP (!) 155/87   Pulse 82   Temp 98.3 F (36.8 C) (Oral)   Resp 13   SpO2 98%   . General: 65 y.o. year-old female well developed well nourished in no acute distress.  Alert and oriented x3. . Cardiovascular: Regular rate and rhythm with no rubs or gallops.  No thyromegaly or JVD noted.  No lower extremity edema. 2/4 pulses in all 4 extremities. Marland Kitchen Respiratory: Clear to auscultation with no wheezes or rales. Good inspiratory effort. . Abdomen: Soft diffuse abdominal tenderness on palpation, nondistended with normal bowel sounds x4 quadrants. . Muskuloskeletal: No cyanosis, clubbing or edema noted bilaterally . Neuro: CN II-XII intact,  strength, sensation, reflexes . Skin: No ulcerative lesions noted or rashes . Psychiatry: Judgement and insight appear normal. Mood is appropriate for condition and setting          Labs on Admission:  Basic Metabolic Panel: Recent Labs  Lab 12/15/20 1122  NA 138  K 3.2*  CL 104  CO2 21*  GLUCOSE 201*  BUN 16  CREATININE 1.28*  CALCIUM 9.2   Liver Function Tests: Recent Labs  Lab 12/15/20 1122  AST 17  ALT 13  ALKPHOS 140*  BILITOT 0.7  PROT 7.7  ALBUMIN 3.5   Recent Labs  Lab 12/15/20 1122  LIPASE 44   No results for input(s): AMMONIA in the last 168 hours. CBC: Recent Labs  Lab 12/15/20 1122  WBC 4.9  HGB 11.2*  HCT 33.1*  MCV 87.8  PLT 272   Cardiac Enzymes: No results for input(s): CKTOTAL, CKMB, CKMBINDEX, TROPONINI in the last 168 hours.  BNP (last 3 results) No results for input(s): BNP in the last 8760 hours.  ProBNP (last 3  results) No results for input(s): PROBNP in the last 8760 hours.  CBG: Recent Labs  Lab 12/15/20 1521 12/16/20 0000  GLUCAP 157* 150*    Radiological Exams on Admission: CT Abdomen Pelvis W Contrast  Result Date: 12/15/2020 CLINICAL DATA:  Abdominal pain and fever. Generalized weakness. Nausea, vomiting and diarrhea. EXAM: CT ABDOMEN AND PELVIS WITH CONTRAST TECHNIQUE: Multidetector CT imaging of the abdomen and pelvis was performed using the standard protocol following bolus administration of intravenous contrast. CONTRAST:  128m OMNIPAQUE IOHEXOL 300 MG/ML  SOLN COMPARISON:  CT 11/02/2019 FINDINGS: Lower chest: No acute or focal airspace disease. No pleural fluid. Heart is normal in size. Hepatobiliary: No focal hepatic abnormality. Partially distended gallbladder without calcified gallstone or pericholecystic inflammation. Common bile duct upper normal at the porta hepatis measuring 7 mm, but normal tapering. Pancreas: No ductal dilatation or inflammation. Spleen: Normal in size without focal abnormality. Tiny  splenule anteriorly. Adrenals/Urinary Tract: No adrenal nodule. No hydronephrosis or perinephric edema. Homogeneous renal enhancement with symmetric excretion on delayed phase imaging. There is scattered subcentimeter low-density lesions throughout both kidneys that are too small to characterize, but not significantly changed from prior exam. This includes a low-density lesion in the lower right kidney measuring 6 mm, series 8, image 22. Urinary bladder is physiologically distended without wall thickening. Stomach/Bowel: Bowel evaluation is limited in the absence of enteric contrast. Decompressed stomach, not well assessed. Normal positioning of the duodenum and ligament of Treitz. Decompressed small bowel without obstruction. No small bowel inflammation. Normal air-filled appendix in the right lower quadrant. Suggestion of colonic wall thickening from the hepatic flexure of the colon distally. Formed stool in the ascending colon, otherwise decompressed colon. Minimal sigmoid diverticulosis without diverticulitis. Vascular/Lymphatic: Abdominal aorta is normal in caliber. Patent portal vein. No acute vascular findings. No enlarged lymph nodes in the abdomen or pelvis. Reproductive: Hysterectomy.  Quiescent ovaries.  No adnexal mass. Other: No ascites, free air or focal fluid collection. Musculoskeletal: There are no acute or suspicious osseous abnormalities. IMPRESSION: 1. Suggestion of colonic wall thickening from the hepatic flexure of the colon distally, suspicious for colitis. 2. Minimal sigmoid diverticulosis without diverticulitis. Electronically Signed   By: MKeith RakeM.D.   On: 12/15/2020 17:23    EKG: I independently viewed the EKG done and my findings are as followed: Normal sinus rhythm rate of 100.  Nonspecific ST-T changes.  Assessment/Plan Present on Admission: . Colitis  Active Problems:   Colitis  Presumed colitis Presented with nausea vomiting and diarrhea of 3 days duration CT  abdomen and pelvis with contrast on 12/15/2020 showed suggestion of colonic wall thickening from the hepatic flexure of the colon distally, suspicious for colitis.  Minimal sigmoid diverticulosis without diverticulitis. Started on Augmentin in the ED however was not able to tolerate oral intake due to persistent nausea and vomiting. Switched to Zosyn empirically for presumptive intra-abdominal infection. Start IV fluid hydration and IV antiemetics Currently afebrile with no leukocytosis Monitor fever curve and WBC, obtain lactic acid level. Repeat CBC in the morning.  Intractable nausea vomiting suspect secondary to presumed colitis Normal lipase level Unremarkable LFTs except for elevated alkaline phosphatase 140. IV antiemetics as needed Clear liquid diet, advance her diet as she tolerates.  Symptomatic pyuria UA positive for pyuria, endorses mild dysuria. History of E. coli UTI sensitive to Zosyn (07/24/19), continue Follow urine culture for ID and sensitivities.  Intractable diffuse abdominal pain, suspect spasm related Start Bentyl and monitor for response Treat underlying condition  Resolved acute  diarrhea of 3 days duration No recurrence while in the ED Continue to monitor for any recurrences  Type 1 diabetes with hyperglycemia Presented with serum glucose 201 Obtain Hemoglobin A1c Start Lantus and insulin sliding scale.  Essential hypertension BP is not at goal Resume home Toprol-XL and Norvasc Home HCTZ and home olmesartan on hold due to elevated creatinine Monitor vital signs.  Hypokalemia Serum potassium 3.2 Repleted intravenously. Recheck in the morning.  CKD stage IIIb Appears to be close to her baseline creatinine 1.2 with GFR 47 Avoid nephrotoxins, dehydration and hypotension Monitor urine output Repeat renal panel in the morning.  Isolated elevated alkaline phosphatase AST ALT and T bili normal. Monitor and repeat level in the morning  Diabetes  polyneuropathy Resume home gabapentin  Chronic depression/anxiety Resume home regimen She is on Zoloft and amitriptyline   DVT prophylaxis: Subcu Lovenox daily  Code Status: Full code as stated by the patient herself.  Family Communication: None at bedside.  Disposition Plan: Admit to MedSurg with remote telemetry.  Consults called: None.  Admission status: Observation status.   Status is: Observation    Dispo:  Patient From: Home  Planned Disposition: Home  Expected discharge date: 12/16/2020  Medically stable for discharge: No, ongoing management of presumed colitis.      Kayleen Memos MD [facility] Pager [phone]  If 7PM-7AM, please contact night-coverage [email or web address] Password [facility]  12/16/2020, 12:46 AM

## 2020-12-16 DIAGNOSIS — K529 Noninfective gastroenteritis and colitis, unspecified: Secondary | ICD-10-CM | POA: Diagnosis not present

## 2020-12-16 LAB — BASIC METABOLIC PANEL
Anion gap: 10 (ref 5–15)
BUN: 9 mg/dL (ref 8–23)
CO2: 25 mmol/L (ref 22–32)
Calcium: 9 mg/dL (ref 8.9–10.3)
Chloride: 104 mmol/L (ref 98–111)
Creatinine, Ser: 1.03 mg/dL — ABNORMAL HIGH (ref 0.44–1.00)
GFR, Estimated: >60 mL/min (ref >60)
Glucose, Bld: 164 mg/dL — ABNORMAL HIGH (ref 70–99)
Potassium: 3.6 mmol/L (ref 3.5–5.1)
Sodium: 139 mmol/L (ref 135–145)

## 2020-12-16 LAB — LACTIC ACID, PLASMA: Lactic Acid, Venous: 0.9 mmol/L (ref 0.5–1.9)

## 2020-12-16 LAB — GLUCOSE, CAPILLARY: Glucose-Capillary: 132 mg/dL — ABNORMAL HIGH (ref 70–99)

## 2020-12-16 LAB — HEMOGLOBIN A1C
Hgb A1c MFr Bld: 9.2 % — ABNORMAL HIGH (ref 4.8–5.6)
Mean Plasma Glucose: 217.34 mg/dL

## 2020-12-16 LAB — CBG MONITORING, ED
Glucose-Capillary: 125 mg/dL — ABNORMAL HIGH (ref 70–99)
Glucose-Capillary: 150 mg/dL — ABNORMAL HIGH (ref 70–99)
Glucose-Capillary: 160 mg/dL — ABNORMAL HIGH (ref 70–99)

## 2020-12-16 MED ORDER — LABETALOL HCL 5 MG/ML IV SOLN: 10.0000 mg | INTRAVENOUS | Status: DC | PRN

## 2020-12-16 MED ORDER — DICYCLOMINE HCL 10 MG PO CAPS
10.0000 mg | ORAL_CAPSULE | Freq: Three times a day (TID) | ORAL | Status: DC
  Administered 2020-12-16 – 2020-12-17 (×3): 10 mg via ORAL
  Filled 2020-12-16 (×6): qty 1

## 2020-12-16 MED ORDER — ONDANSETRON HCL 4 MG/2ML IJ SOLN: 4.0000 mg | Freq: Four times a day (QID) | INTRAMUSCULAR | Status: DC | PRN

## 2020-12-16 MED ORDER — PNEUMOCOCCAL VAC POLYVALENT 25 MCG/0.5ML IJ INJ
0.5000 mL | INJECTION | INTRAMUSCULAR | Status: AC
  Administered 2020-12-19: 0.5 mL via INTRAMUSCULAR
  Filled 2020-12-16: qty 0.5

## 2020-12-16 MED ORDER — LABETALOL HCL 5 MG/ML IV SOLN
10.0000 mg | Freq: Once | INTRAVENOUS | Status: AC
  Administered 2020-12-16: 10 mg via INTRAVENOUS
  Filled 2020-12-16: qty 4

## 2020-12-16 MED ORDER — ENOXAPARIN SODIUM 40 MG/0.4ML ~~LOC~~ SOLN
40.0000 mg | SUBCUTANEOUS | Status: DC
  Administered 2020-12-17 – 2020-12-18 (×2): 40 mg via SUBCUTANEOUS
  Filled 2020-12-16 (×2): qty 0.4

## 2020-12-16 NOTE — Plan of Care (Signed)

## 2020-12-16 NOTE — ED Notes (Signed)
RN attempted report 

## 2020-12-16 NOTE — ED Notes (Signed)
MS Breakfast order placed

## 2020-12-16 NOTE — ED Notes (Signed)
Pt on Cardiac Monitor

## 2020-12-16 NOTE — Progress Notes (Signed)
PROGRESS NOTE    Tricia Huynh  OXB:[id number] DOB: [birth year] DOA: 12/15/2020 PCP: Jonathon Jordan, MD   Chief Complain: Nausea, vomiting, diarrhea  Brief Narrative: Patient is a 48 female with history of type 1 diabetes mellitus, diabetic polyneuropathy, hyperlipidemia, hypertension, seizure disorder who presented at [facility] with complaints of nausea, vomiting and diarrhea for 3 days duration and associated abdomen pain.  CT abdomen/pelvis done in the emergency department showed colonic wall thickening of the hepatic flexure raising concern for colitis.  She was started on oral antibiotics but she was unable to tolerate oral intake and had persistent nausea and vomiting so was admitted.  On presentation, she was hypertensive, had hypokalemia.  Assessment & Plan:   Active Problems:   Colitis   Suspected colitis: Presented with nausea, vomiting, abdominal pain.  CT abdomen/pelvis finding as above.  No findings of diverticulitis.  Continue Zosyn for now.  Check GI pathogen panel patient denies any diarrhea at the moment though.  Continue gentle IV fluids.  No fever or leukocytosis  Nausea/vomiting/abdominal pain: Most likely secondary to colitis.  Continue supportive care, gentle IV fluids.  Clear liquid diet for now.  Hypertension: Continue home medications.  Use labetalol for severe hypertension.  Pyuria: Denies any dysuria.  She has history of E. coli UTI.  Follow-up urine culture.  Type 1 diabetes mellitus: Hemoglobin A1c of 9.2.  Continue sliding scale insulin and Lantus.  Monitor blood sugars  Hypokalemia: Being supplemented and monitor  CKD stage IIIa: Baseline creatinine 1.2.  Currently kidney function at baseline.  Avoid nephrotoxins  Diabetic peripheral neuropathy: On gabapentin  Depression/anxiety: On Zoloft and amitriptyline at home.               DVT prophylaxis:Lovenox Code Status: Full Family Communication:  Status is:  Observation   Dispo:  Patient From: Home  Planned Disposition: Home  Expected discharge date: 12/17/20  Medically stable for discharge: No     Consultants:   Procedures:  Antimicrobials:  Anti-infectives (From admission, onward)   Start     Dose/Rate Route Frequency Ordered Stop   12/15/20 2359  piperacillin-tazobactam (ZOSYN) IVPB 3.375 g        3.375 g 12.5 mL/hr over 240 Minutes Intravenous Every 8 hours 12/15/20 2329     12/15/20 2200  amoxicillin-clavulanate (AUGMENTIN) 875-125 MG per tablet 1 tablet  Status:  Discontinued        1 tablet Oral  Once 12/15/20 2159 12/15/20 2305   12/15/20 0000  amoxicillin-clavulanate (AUGMENTIN) 875-125 MG tablet        1 tablet Oral Every 8 hours 12/15/20 2207 12/22/20 2359      Subjective:  Patient seen and examined at the bedside this morning.  Hemodynamically stable during my evaluation.  Complains of nausea.  Denies any significant abdominal pain today.  No diarrhea today.   Objective: Vitals:   12/16/20 0843 12/16/20 1040 12/16/20 1200 12/16/20 1235  BP: (!) 182/91 (!) 171/88 (!) 168/77 (!) 156/91  Pulse: 75 77 71 72  Resp: 20 20 16 12   Temp: 98 F (36.7 C)     TempSrc: Oral     SpO2: 99% 99% 97% 99%    Intake/Output Summary (Last 24 hours) at 12/16/2020 1324 Last data filed at 12/16/2020 1217 Gross per 24 hour  Intake 1649.4 ml  Output -  Net 1649.4 ml   There were no vitals filed for this visit.  Examination:  General exam: Not in distress,average built HEENT:PERRL,Oral mucosa moist,  Ear/Nose normal on gross exam Respiratory system: Bilateral equal air entry, normal vesicular breath sounds, no wheezes or crackles  Cardiovascular system: S1 & S2 heard, RRR. No JVD, murmurs, rubs, gallops or clicks. No pedal edema. Gastrointestinal system: Abdomen is nondistended, soft and nontender. No organomegaly or masses felt. Normal bowel sounds heard. Central nervous system: Alert and oriented. No focal neurological  deficits. Extremities: No edema, no clubbing ,no cyanosis Skin: No rashes, lesions or ulcers,no icterus ,no pallor   Data Reviewed: I have personally reviewed following labs and imaging studies  CBC: Recent Labs  Lab 12/15/20 1122  WBC 4.9  HGB 11.2*  HCT 33.1*  MCV 87.8  PLT 998   Basic Metabolic Panel: Recent Labs  Lab 12/15/20 1122 12/16/20 0936  NA 138 139  K 3.2* 3.6  CL 104 104  CO2 21* 25  GLUCOSE 201* 164*  BUN 16 9  CREATININE 1.28* 1.03*  CALCIUM 9.2 9.0   GFR: CrCl cannot be calculated (Unknown ideal weight.). Liver Function Tests: Recent Labs  Lab 12/15/20 1122  AST 17  ALT 13  ALKPHOS 140*  BILITOT 0.7  PROT 7.7  ALBUMIN 3.5   Recent Labs  Lab 12/15/20 1122  LIPASE 44   No results for input(s): AMMONIA in the last 168 hours. Coagulation Profile: No results for input(s): INR, PROTIME in the last 168 hours. Cardiac Enzymes: No results for input(s): CKTOTAL, CKMB, CKMBINDEX, TROPONINI in the last 168 hours. BNP (last 3 results) No results for input(s): PROBNP in the last 8760 hours. HbA1C: Recent Labs    12/16/20 0000  HGBA1C 9.2*   CBG: Recent Labs  Lab 12/15/20 1521 12/16/20 0000 12/16/20 0805 12/16/20 1209  GLUCAP 157* 150* 125* 160*   Lipid Profile: No results for input(s): CHOL, HDL, LDLCALC, TRIG, CHOLHDL, LDLDIRECT in the last 72 hours. Thyroid Function Tests: No results for input(s): TSH, T4TOTAL, FREET4, T3FREE, THYROIDAB in the last 72 hours. Anemia Panel: No results for input(s): VITAMINB12, FOLATE, FERRITIN, TIBC, IRON, RETICCTPCT in the last 72 hours. Sepsis Labs: Recent Labs  Lab 12/16/20 0009  LATICACIDVEN 0.9    Recent Results (from the past 240 hour(s))  SARS CORONAVIRUS 2 (TAT 6-24 HRS) Nasopharyngeal Nasopharyngeal Swab     Status: None   Collection Time: 12/15/20  2:24 PM   Specimen: Nasopharyngeal Swab  Result Value Ref Range Status   SARS Coronavirus 2 NEGATIVE NEGATIVE Final    Comment:  (NOTE) SARS-CoV-2 target nucleic acids are NOT DETECTED.  The SARS-CoV-2 RNA is generally detectable in upper and lower respiratory specimens during the acute phase of infection. Negative results do not preclude SARS-CoV-2 infection, do not rule out co-infections with other pathogens, and should not be used as the sole basis for treatment or other patient management decisions. Negative results must be combined with clinical observations, patient history, and epidemiological information. The expected result is Negative.  Fact Sheet for Patients: SugarRoll.be  Fact Sheet for Healthcare Providers: https://www.woods-mathews.com/  This test is not yet approved or cleared by the [place] FDA and  has been authorized for detection and/or diagnosis of SARS-CoV-2 by FDA under an Emergency Use Authorization (EUA). This EUA will remain  in effect (meaning this test can be used) for the duration of the COVID-19 declaration under Se ction 564(b)(1) of the Act, 21 U.S.C. section 360bbb-3(b)(1), unless the authorization is terminated or revoked sooner.  Performed at [facility], [place] [address] [place], [place] [address]  Radiology Studies: CT Abdomen Pelvis W Contrast  Result Date: 12/15/2020 CLINICAL DATA:  Abdominal pain and fever. Generalized weakness. Nausea, vomiting and diarrhea. EXAM: CT ABDOMEN AND PELVIS WITH CONTRAST TECHNIQUE: Multidetector CT imaging of the abdomen and pelvis was performed using the standard protocol following bolus administration of intravenous contrast. CONTRAST:  136mL OMNIPAQUE IOHEXOL 300 MG/ML  SOLN COMPARISON:  CT 11/02/2019 FINDINGS: Lower chest: No acute or focal airspace disease. No pleural fluid. Heart is normal in size. Hepatobiliary: No focal hepatic abnormality. Partially distended gallbladder without calcified gallstone or pericholecystic inflammation. Common bile duct upper normal at the  porta hepatis measuring 7 mm, but normal tapering. Pancreas: No ductal dilatation or inflammation. Spleen: Normal in size without focal abnormality. Tiny splenule anteriorly. Adrenals/Urinary Tract: No adrenal nodule. No hydronephrosis or perinephric edema. Homogeneous renal enhancement with symmetric excretion on delayed phase imaging. There is scattered subcentimeter low-density lesions throughout both kidneys that are too small to characterize, but not significantly changed from prior exam. This includes a low-density lesion in the lower right kidney measuring 6 mm, series 8, image 22. Urinary bladder is physiologically distended without wall thickening. Stomach/Bowel: Bowel evaluation is limited in the absence of enteric contrast. Decompressed stomach, not well assessed. Normal positioning of the duodenum and ligament of Treitz. Decompressed small bowel without obstruction. No small bowel inflammation. Normal air-filled appendix in the right lower quadrant. Suggestion of colonic wall thickening from the hepatic flexure of the colon distally. Formed stool in the ascending colon, otherwise decompressed colon. Minimal sigmoid diverticulosis without diverticulitis. Vascular/Lymphatic: Abdominal aorta is normal in caliber. Patent portal vein. No acute vascular findings. No enlarged lymph nodes in the abdomen or pelvis. Reproductive: Hysterectomy.  Quiescent ovaries.  No adnexal mass. Other: No ascites, free air or focal fluid collection. Musculoskeletal: There are no acute or suspicious osseous abnormalities. IMPRESSION: 1. Suggestion of colonic wall thickening from the hepatic flexure of the colon distally, suspicious for colitis. 2. Minimal sigmoid diverticulosis without diverticulitis. Electronically Signed   By: Keith Rake M.D.   On: 12/15/2020 17:23        Scheduled Meds: . amitriptyline  50 mg Oral QHS  . amLODipine  10 mg Oral Daily  . aspirin EC  81 mg Oral Daily  . atorvastatin  40 mg Oral  Daily  . dicyclomine  10 mg Oral TID AC & HS  . gabapentin  100 mg Oral TID  . insulin aspart  0-5 Units Subcutaneous QHS  . insulin aspart  0-6 Units Subcutaneous TID WC  . insulin glargine  5 Units Subcutaneous QHS  . metoprolol succinate  25 mg Oral QHS  . sertraline  50 mg Oral Daily   Continuous Infusions: . 0.9 % NaCl with KCl 40 mEq / L 75 mL/hr (12/16/20 0936)  . piperacillin-tazobactam (ZOSYN)  IV Stopped (12/16/20 1217)     LOS: 0 days    Time spent: 35 mins.More than 50% of that time was spent in counseling and/or coordination of care.      Shelly Coss, MD [facility] P1/30/2022, 1:24 PM

## 2020-12-17 DIAGNOSIS — E1065 Type 1 diabetes mellitus with hyperglycemia: Secondary | ICD-10-CM | POA: Diagnosis present

## 2020-12-17 DIAGNOSIS — I129 Hypertensive chronic kidney disease with stage 1 through stage 4 chronic kidney disease, or unspecified chronic kidney disease: Secondary | ICD-10-CM | POA: Diagnosis present

## 2020-12-17 DIAGNOSIS — Z885 Allergy status to narcotic agent status: Secondary | ICD-10-CM | POA: Diagnosis not present

## 2020-12-17 DIAGNOSIS — F419 Anxiety disorder, unspecified: Secondary | ICD-10-CM | POA: Diagnosis present

## 2020-12-17 DIAGNOSIS — Z8249 Family history of ischemic heart disease and other diseases of the circulatory system: Secondary | ICD-10-CM | POA: Diagnosis not present

## 2020-12-17 DIAGNOSIS — Z23 Encounter for immunization: Secondary | ICD-10-CM | POA: Diagnosis present

## 2020-12-17 DIAGNOSIS — K529 Noninfective gastroenteritis and colitis, unspecified: Secondary | ICD-10-CM | POA: Diagnosis present

## 2020-12-17 DIAGNOSIS — F32A Depression, unspecified: Secondary | ICD-10-CM | POA: Diagnosis present

## 2020-12-17 DIAGNOSIS — Z794 Long term (current) use of insulin: Secondary | ICD-10-CM | POA: Diagnosis not present

## 2020-12-17 DIAGNOSIS — Z7982 Long term (current) use of aspirin: Secondary | ICD-10-CM | POA: Diagnosis not present

## 2020-12-17 DIAGNOSIS — E876 Hypokalemia: Secondary | ICD-10-CM | POA: Diagnosis present

## 2020-12-17 DIAGNOSIS — Z87440 Personal history of urinary (tract) infections: Secondary | ICD-10-CM | POA: Diagnosis not present

## 2020-12-17 DIAGNOSIS — Z833 Family history of diabetes mellitus: Secondary | ICD-10-CM | POA: Diagnosis not present

## 2020-12-17 DIAGNOSIS — N1832 Chronic kidney disease, stage 3b: Secondary | ICD-10-CM | POA: Diagnosis present

## 2020-12-17 DIAGNOSIS — E1042 Type 1 diabetes mellitus with diabetic polyneuropathy: Secondary | ICD-10-CM | POA: Diagnosis present

## 2020-12-17 DIAGNOSIS — R748 Abnormal levels of other serum enzymes: Secondary | ICD-10-CM | POA: Diagnosis present

## 2020-12-17 DIAGNOSIS — Z79899 Other long term (current) drug therapy: Secondary | ICD-10-CM | POA: Diagnosis not present

## 2020-12-17 DIAGNOSIS — R8281 Pyuria: Secondary | ICD-10-CM | POA: Diagnosis present

## 2020-12-17 DIAGNOSIS — E785 Hyperlipidemia, unspecified: Secondary | ICD-10-CM | POA: Diagnosis present

## 2020-12-17 DIAGNOSIS — E1022 Type 1 diabetes mellitus with diabetic chronic kidney disease: Secondary | ICD-10-CM | POA: Diagnosis present

## 2020-12-17 DIAGNOSIS — G40909 Epilepsy, unspecified, not intractable, without status epilepticus: Secondary | ICD-10-CM | POA: Diagnosis present

## 2020-12-17 DIAGNOSIS — Z20822 Contact with and (suspected) exposure to covid-19: Secondary | ICD-10-CM | POA: Diagnosis present

## 2020-12-17 DIAGNOSIS — Z87891 Personal history of nicotine dependence: Secondary | ICD-10-CM | POA: Diagnosis not present

## 2020-12-17 DIAGNOSIS — R7989 Other specified abnormal findings of blood chemistry: Secondary | ICD-10-CM | POA: Diagnosis present

## 2020-12-17 LAB — GLUCOSE, CAPILLARY
Glucose-Capillary: 74 mg/dL (ref 70–99)
Glucose-Capillary: 115 mg/dL — ABNORMAL HIGH (ref 70–99)
Glucose-Capillary: 103 mg/dL — ABNORMAL HIGH (ref 70–99)
Glucose-Capillary: 94 mg/dL (ref 70–99)
Glucose-Capillary: 131 mg/dL — ABNORMAL HIGH (ref 70–99)

## 2020-12-17 LAB — URINE CULTURE: Culture: <10000 — AB

## 2020-12-17 LAB — BASIC METABOLIC PANEL
Anion gap: 11 (ref 5–15)
BUN: 6 mg/dL — ABNORMAL LOW (ref 8–23)
CO2: 22 mmol/L (ref 22–32)
Calcium: 8.8 mg/dL — ABNORMAL LOW (ref 8.9–10.3)
Chloride: 108 mmol/L (ref 98–111)
Creatinine, Ser: 1.04 mg/dL — ABNORMAL HIGH (ref 0.44–1.00)
GFR, Estimated: >60 mL/min (ref >60)
Glucose, Bld: 79 mg/dL (ref 70–99)
Potassium: 3.6 mmol/L (ref 3.5–5.1)
Sodium: 141 mmol/L (ref 135–145)

## 2020-12-17 MED ORDER — PROSOURCE PLUS PO LIQD
30.0000 mL | Freq: Three times a day (TID) | ORAL | Status: DC
  Administered 2020-12-17 – 2020-12-19 (×5): 30 mL via ORAL
  Filled 2020-12-17 (×6): qty 30

## 2020-12-17 MED ORDER — SODIUM CHLORIDE 0.9 % IV SOLN: INTRAVENOUS | Status: DC

## 2020-12-17 MED ORDER — MORPHINE SULFATE (PF) 2 MG/ML IV SOLN
2.0000 mg | INTRAVENOUS | Status: DC | PRN
  Administered 2020-12-17 – 2020-12-18 (×4): 2 mg via INTRAVENOUS
  Filled 2020-12-17 (×4): qty 1

## 2020-12-17 MED ORDER — DICYCLOMINE HCL 10 MG/5ML PO SOLN
10.0000 mg | Freq: Three times a day (TID) | ORAL | Status: AC
  Administered 2020-12-17 (×2): 10 mg via ORAL
  Filled 2020-12-17 (×2): qty 5

## 2020-12-17 MED ORDER — BOOST / RESOURCE BREEZE PO LIQD CUSTOM
1.0000 | Freq: Two times a day (BID) | ORAL | Status: DC
  Administered 2020-12-17: 1 via ORAL

## 2020-12-17 MED ORDER — ADULT MULTIVITAMIN W/MINERALS CH
1.0000 | ORAL_TABLET | Freq: Every day | ORAL | Status: DC
  Administered 2020-12-17 – 2020-12-19 (×3): 1 via ORAL
  Filled 2020-12-17 (×3): qty 1

## 2020-12-17 NOTE — Progress Notes (Signed)
PROGRESS NOTE    Tricia Huynh  POE:[id number] DOB: 03-15-1956 DOA: 12/15/2020 PCP: Jonathon Jordan, MD   Chief Complain: Nausea, vomiting, diarrhea  Brief Narrative: Patient is a 24 female with history of type 1 diabetes mellitus, diabetic polyneuropathy, hyperlipidemia, hypertension, seizure disorder who presented at [facility] with complaints of nausea, vomiting and diarrhea for 3 days duration and associated abdomen pain.  CT abdomen/pelvis done in the emergency department showed colonic wall thickening of the hepatic flexure raising concern for colitis.  She was started on oral antibiotics but she was unable to tolerate oral intake and had persistent nausea and vomiting so was admitted.  On presentation, she was hypertensive, had hypokalemia. Her nausea, vomiting and diarrhea have improved but she complains of persistent abdominal discomfort so discharge not done today.  Assessment & Plan:   Active Problems:   Colitis   Suspected colitis: Presented with nausea, vomiting, abdominal pain.  CT abdomen/pelvis finding as above.  No findings of diverticulitis.  Continue Zosyn for now.  Check GI pathogen panel, patient denies any diarrhea at the moment though.  Continue gentle IV fluids.  No fever or leukocytosis Complains of generalized abdominal discomfort.  Continue pain management, supportive care.  Nausea/vomiting/abdominal pain: Most likely secondary to colitis.  Continue supportive care, gentle IV fluids.  Clear liquid diet for now.  Will advance her diet as tolerated.  Hypertension: Continue home medications.  Use labetalol for severe hypertension.  Pyuria: Denies any dysuria.  She has history of E. coli UTI.  Urine culture showed insignificant growth  Type 1 diabetes mellitus: Hemoglobin A1c of 9.2.  Continue sliding scale insulin and Lantus.  Monitor blood sugars  Hypokalemia: Being supplemented and monitored  CKD stage IIIa: Baseline creatinine 1.2.   Currently kidney function at baseline.  Avoid nephrotoxins  Diabetic peripheral neuropathy: On gabapentin  Depression/anxiety: On Zoloft and amitriptyline at home.          DVT prophylaxis:Lovenox Code Status: Full Family Communication: None Status is: Observation   Dispo:  Patient From: Home  Planned Disposition: Home  Expected discharge date: 12/18/20  Medically stable for discharge: No     Consultants:   Procedures:  Antimicrobials:  Anti-infectives (From admission, onward)   Start     Dose/Rate Route Frequency Ordered Stop   12/15/20 2359  piperacillin-tazobactam (ZOSYN) IVPB 3.375 g        3.375 g 12.5 mL/hr over 240 Minutes Intravenous Every 8 hours 12/15/20 2329     12/15/20 2200  amoxicillin-clavulanate (AUGMENTIN) 875-125 MG per tablet 1 tablet  Status:  Discontinued        1 tablet Oral  Once 12/15/20 2159 12/15/20 2305   12/15/20 0000  amoxicillin-clavulanate (AUGMENTIN) 875-125 MG tablet        1 tablet Oral Every 8 hours 12/15/20 2207 12/22/20 2359      Subjective:   Patient seen and examined the bedside this morning.  Hemodynamically stable during my evaluation.  She does not complain of any vomiting or diarrhea from yesterday but she is bothered by abdominal discomfort.  She was asking for some pain medications.  She did not want to advance the diet today.  Objective: Vitals:   12/16/20 2003 12/16/20 2100 12/17/20 0325 12/17/20 0845  BP: (!) 156/90  136/79 (!) 159/94  Pulse: 79  72 77  Resp: 16  16 17   Temp: 98.5 F (36.9 C)  98.6 F (37 C) 97.8 F (36.6 C)  TempSrc: Oral  Oral Oral  SpO2: 100%  100% 99%  Weight:  58.4 kg    Height:  5\' 7"  (1.702 m)      Intake/Output Summary (Last 24 hours) at 12/17/2020 1143 Last data filed at 12/17/2020 0500 Gross per 24 hour  Intake 969.71 ml  Output 202 ml  Net 767.71 ml   Filed Weights   12/16/20 1358 12/16/20 2100  Weight: 63.5 kg 58.4 kg    Examination:  General exam: Appears calm and  comfortable ,Not in distress,average built HEENT:PERRL,Oral mucosa moist, Ear/Nose normal on gross exam Respiratory system: Bilateral equal air entry, normal vesicular breath sounds, no wheezes or crackles  Cardiovascular system: S1 & S2 heard, RRR. No JVD, murmurs, rubs, gallops or clicks. Gastrointestinal system: Abdomen is nondistended, soft and has mild generalized tenderness. No organomegaly or masses felt. Normal bowel sounds heard. Central nervous system: Alert and oriented. No focal neurological deficits. Extremities: No edema, no clubbing ,no cyanosis, distal peripheral pulses palpable. Skin: No rashes, lesions or ulcers,no icterus ,no pallor   Data Reviewed: I have personally reviewed following labs and imaging studies  CBC: Recent Labs  Lab 12/15/20 1122  WBC 4.9  HGB 11.2*  HCT 33.1*  MCV 87.8  PLT 671   Basic Metabolic Panel: Recent Labs  Lab 12/15/20 1122 12/16/20 0936 12/17/20 0433  NA 138 139 141  K 3.2* 3.6 3.6  CL 104 104 108  CO2 21* 25 22  GLUCOSE 201* 164* 79  BUN 16 9 6*  CREATININE 1.28* 1.03* 1.04*  CALCIUM 9.2 9.0 8.8*   GFR: Estimated Creatinine Clearance: 50.4 mL/min (A) (by C-G formula based on SCr of 1.04 mg/dL (H)). Liver Function Tests: Recent Labs  Lab 12/15/20 1122  AST 17  ALT 13  ALKPHOS 140*  BILITOT 0.7  PROT 7.7  ALBUMIN 3.5   Recent Labs  Lab 12/15/20 1122  LIPASE 44   No results for input(s): AMMONIA in the last 168 hours. Coagulation Profile: No results for input(s): INR, PROTIME in the last 168 hours. Cardiac Enzymes: No results for input(s): CKTOTAL, CKMB, CKMBINDEX, TROPONINI in the last 168 hours. BNP (last 3 results) No results for input(s): PROBNP in the last 8760 hours. HbA1C: Recent Labs    12/16/20 0000  HGBA1C 9.2*   CBG: Recent Labs  Lab 12/16/20 0805 12/16/20 1209 12/16/20 2039 12/17/20 0647 12/17/20 0845  GLUCAP 125* 160* 132* 74 115*   Lipid Profile: No results for input(s): CHOL,  HDL, LDLCALC, TRIG, CHOLHDL, LDLDIRECT in the last 72 hours. Thyroid Function Tests: No results for input(s): TSH, T4TOTAL, FREET4, T3FREE, THYROIDAB in the last 72 hours. Anemia Panel: No results for input(s): VITAMINB12, FOLATE, FERRITIN, TIBC, IRON, RETICCTPCT in the last 72 hours. Sepsis Labs: Recent Labs  Lab 12/16/20 0009  LATICACIDVEN 0.9    Recent Results (from the past 240 hour(s))  SARS CORONAVIRUS 2 (TAT 6-24 HRS) Nasopharyngeal Nasopharyngeal Swab     Status: None   Collection Time: 12/15/20  2:24 PM   Specimen: Nasopharyngeal Swab  Result Value Ref Range Status   SARS Coronavirus 2 NEGATIVE NEGATIVE Final    Comment: (NOTE) SARS-CoV-2 target nucleic acids are NOT DETECTED.  The SARS-CoV-2 RNA is generally detectable in upper and lower respiratory specimens during the acute phase of infection. Negative results do not preclude SARS-CoV-2 infection, do not rule out co-infections with other pathogens, and should not be used as the sole basis for treatment or other patient management decisions. Negative results must be combined with clinical observations, patient history,  and epidemiological information. The expected result is Negative.  Fact Sheet for Patients: SugarRoll.be  Fact Sheet for Healthcare Providers: https://www.woods-mathews.com/  This test is not yet approved or cleared by the [place] FDA and  has been authorized for detection and/or diagnosis of SARS-CoV-2 by FDA under an Emergency Use Authorization (EUA). This EUA will remain  in effect (meaning this test can be used) for the duration of the COVID-19 declaration under Se ction 564(b)(1) of the Act, 21 U.S.C. section 360bbb-3(b)(1), unless the authorization is terminated or revoked sooner.  Performed at [facility], [place] [address], [place], [place] [address]   Urine culture     Status: Abnormal   Collection Time: 12/15/20  7:15 PM   Specimen:  Urine, Random  Result Value Ref Range Status   Specimen Description URINE, RANDOM  Final   Special Requests NONE  Final   Culture (A)  Final    <10,000 COLONIES/mL INSIGNIFICANT GROWTH Performed at [facility], [place] [address], [place], [place] [address]    Report Status 12/17/2020 FINAL  Final         Radiology Studies: CT Abdomen Pelvis W Contrast  Result Date: 12/15/2020 CLINICAL DATA:  Abdominal pain and fever. Generalized weakness. Nausea, vomiting and diarrhea. EXAM: CT ABDOMEN AND PELVIS WITH CONTRAST TECHNIQUE: Multidetector CT imaging of the abdomen and pelvis was performed using the standard protocol following bolus administration of intravenous contrast. CONTRAST:  165mL OMNIPAQUE IOHEXOL 300 MG/ML  SOLN COMPARISON:  CT 11/02/2019 FINDINGS: Lower chest: No acute or focal airspace disease. No pleural fluid. Heart is normal in size. Hepatobiliary: No focal hepatic abnormality. Partially distended gallbladder without calcified gallstone or pericholecystic inflammation. Common bile duct upper normal at the porta hepatis measuring 7 mm, but normal tapering. Pancreas: No ductal dilatation or inflammation. Spleen: Normal in size without focal abnormality. Tiny splenule anteriorly. Adrenals/Urinary Tract: No adrenal nodule. No hydronephrosis or perinephric edema. Homogeneous renal enhancement with symmetric excretion on delayed phase imaging. There is scattered subcentimeter low-density lesions throughout both kidneys that are too small to characterize, but not significantly changed from prior exam. This includes a low-density lesion in the lower right kidney measuring 6 mm, series 8, image 22. Urinary bladder is physiologically distended without wall thickening. Stomach/Bowel: Bowel evaluation is limited in the absence of enteric contrast. Decompressed stomach, not well assessed. Normal positioning of the duodenum and ligament of Treitz. Decompressed small bowel without obstruction. No  small bowel inflammation. Normal air-filled appendix in the right lower quadrant. Suggestion of colonic wall thickening from the hepatic flexure of the colon distally. Formed stool in the ascending colon, otherwise decompressed colon. Minimal sigmoid diverticulosis without diverticulitis. Vascular/Lymphatic: Abdominal aorta is normal in caliber. Patent portal vein. No acute vascular findings. No enlarged lymph nodes in the abdomen or pelvis. Reproductive: Hysterectomy.  Quiescent ovaries.  No adnexal mass. Other: No ascites, free air or focal fluid collection. Musculoskeletal: There are no acute or suspicious osseous abnormalities. IMPRESSION: 1. Suggestion of colonic wall thickening from the hepatic flexure of the colon distally, suspicious for colitis. 2. Minimal sigmoid diverticulosis without diverticulitis. Electronically Signed   By: Keith Rake M.D.   On: 12/15/2020 17:23        Scheduled Meds: . amLODipine  10 mg Oral Daily  . aspirin EC  81 mg Oral Daily  . atorvastatin  40 mg Oral Daily  . dicyclomine  10 mg Oral TID AC & HS  . enoxaparin (LOVENOX) injection  40 mg Subcutaneous  Q24H  . gabapentin  100 mg Oral TID  . insulin aspart  0-5 Units Subcutaneous QHS  . insulin aspart  0-6 Units Subcutaneous TID WC  . insulin glargine  5 Units Subcutaneous QHS  . metoprolol succinate  25 mg Oral QHS  . pneumococcal 23 valent vaccine  0.5 mL Intramuscular Tomorrow-1000  . sertraline  50 mg Oral Daily   Continuous Infusions: . piperacillin-tazobactam (ZOSYN)  IV 3.375 g (12/17/20 0831)     LOS: 0 days    Time spent: 35 mins.More than 50% of that time was spent in counseling and/or coordination of care.      Shelly Coss, MD [facility] P1/31/2022, 11:43 AM

## 2020-12-17 NOTE — Plan of Care (Signed)

## 2020-12-17 NOTE — Plan of Care (Signed)

## 2020-12-17 NOTE — Progress Notes (Signed)
Initial Nutrition Assessment  DOCUMENTATION CODES:   Not applicable  INTERVENTION:   -Boost Breeze po BID, each supplement provides 250 kcal and 9 grams of protein -30 ml Prosource Plus TID, each supplement provides 100 kcals and 15 grams protein -MVI with minerals daily -RD will follow for diet advancement and adjust supplement regimen as appropriae  NUTRITION DIAGNOSIS:   Inadequate oral intake related to altered GI function as evidenced by per patient/family report.  GOAL:   Patient will meet greater than or equal to 90% of their needs  MONITOR:   PO intake,Supplement acceptance,Diet advancement,Labs,Weight trends,Skin,I & O's  REASON FOR ASSESSMENT:   Malnutrition Screening Tool    ASSESSMENT:   Patient is a 21 female with history of type 1 diabetes mellitus, diabetic polyneuropathy, hyperlipidemia, hypertension, seizure disorder who presented at [facility] with complaints of nausea, vomiting and diarrhea for 3 days duration and associated abdomen pain.  CT abdomen/pelvis done in the emergency department showed colonic wall thickening of the hepatic flexure raising concern for colitis.  She was started on oral antibiotics but she was unable to tolerate oral intake and had persistent nausea and vomiting so was admitted.  On presentation, she was hypertensive, had hypokalemia.  Pt admitted with suspected colitis.  1/30- CT of abdomen and pelvis revealed colonic wall thickening of heptatic flexure  Reviewed I/O's: +768 ml x 24 hours and +2.4 L since admission  UOP: 202 ml x 24 hours  Pt unavailable at time of attempted contact.  Per MD notes, pt with nausea, vomiting, abdominal pain PTA. Awaiting GI pathogen panel. Pt currently on clear liquid diet, with no meal completion data available to review.   Reviewed wt hx; wt has been stable over the past year, however, noted distant history of weight loss.   Medications reviewed and include 0.9% sodium chloride  infusion @ 75 ml/hr.   Lab Results  Component Value Date   HGBA1C 9.2 (H) 12/16/2020   PTA DM medications are 24 units insulin glargine daily.   Labs reviewed: CBGS: 74-105 (inpatient orders for glycemic control are 0-6 units insulin aspart TID with meals, 0-5 units insulin aspart daily at bedtime, and 5 units insulin glargine daily at bedtime).    Diet Order:   Diet Order            Diet clear liquid Room service appropriate? Yes; Fluid consistency: Thin  Diet effective now                 EDUCATION NEEDS:   No education needs have been identified at this time  Skin:  Skin Assessment: Reviewed RN Assessment  Last BM:  12/14/20  Height:   Ht Readings from Last 1 Encounters:  12/16/20 5\' 7"  (1.702 m)    Weight:   Wt Readings from Last 1 Encounters:  12/16/20 58.4 kg    Ideal Body Weight:  61.4 kg  BMI:  Body mass index is 20.16 kg/m.  Estimated Nutritional Needs:   Kcal:  5573-2202  Protein:  90-105 grams  Fluid:  > 1.7 L    Loistine Chance, RD, LDN, [place] Registered Dietitian II Certified Diabetes Care and Education Specialist Please refer to [facility] for RD and/or RD on-call/weekend/after hours pager

## 2020-12-18 LAB — CBC WITH DIFFERENTIAL/PLATELET
Abs Immature Granulocytes: 0.01 10*3/uL (ref 0.00–0.07)
Basophils Absolute: 0 10*3/uL (ref 0.0–0.1)
Basophils Relative: 0 %
Eosinophils Absolute: 0 10*3/uL (ref 0.0–0.5)
Eosinophils Relative: 0 %
HCT: 29.2 % — ABNORMAL LOW (ref 36.0–46.0)
Hemoglobin: 10.1 g/dL — ABNORMAL LOW (ref 12.0–15.0)
Immature Granulocytes: 0 %
Lymphocytes Relative: 44 %
Lymphs Abs: 2 10*3/uL (ref 0.7–4.0)
MCH: 30.1 pg (ref 26.0–34.0)
MCHC: 34.6 g/dL (ref 30.0–36.0)
MCV: 86.9 fL (ref 80.0–100.0)
Monocytes Absolute: 0.4 10*3/uL (ref 0.1–1.0)
Monocytes Relative: 9 %
Neutro Abs: 2.2 10*3/uL (ref 1.7–7.7)
Neutrophils Relative %: 47 %
Platelets: 234 10*3/uL (ref 150–400)
RBC: 3.36 MIL/uL — ABNORMAL LOW (ref 3.87–5.11)
RDW: 12.5 % (ref 11.5–15.5)
WBC: 4.6 10*3/uL (ref 4.0–10.5)
nRBC: 0 % (ref 0.0–0.2)

## 2020-12-18 LAB — GLUCOSE, CAPILLARY
Glucose-Capillary: 66 mg/dL — ABNORMAL LOW (ref 70–99)
Glucose-Capillary: 110 mg/dL — ABNORMAL HIGH (ref 70–99)
Glucose-Capillary: 102 mg/dL — ABNORMAL HIGH (ref 70–99)
Glucose-Capillary: 105 mg/dL — ABNORMAL HIGH (ref 70–99)
Glucose-Capillary: 193 mg/dL — ABNORMAL HIGH (ref 70–99)

## 2020-12-18 LAB — BASIC METABOLIC PANEL
Anion gap: 10 (ref 5–15)
BUN: 6 mg/dL — ABNORMAL LOW (ref 8–23)
CO2: 23 mmol/L (ref 22–32)
Calcium: 9.1 mg/dL (ref 8.9–10.3)
Chloride: 105 mmol/L (ref 98–111)
Creatinine, Ser: 1.08 mg/dL — ABNORMAL HIGH (ref 0.44–1.00)
GFR, Estimated: 57 mL/min — ABNORMAL LOW (ref >60)
Glucose, Bld: 93 mg/dL (ref 70–99)
Potassium: 3.8 mmol/L (ref 3.5–5.1)
Sodium: 138 mmol/L (ref 135–145)

## 2020-12-18 MED ORDER — INSULIN GLARGINE 100 UNIT/ML ~~LOC~~ SOLN
4.0000 [IU] | Freq: Every day | SUBCUTANEOUS | Status: DC
  Administered 2020-12-18: 4 [IU] via SUBCUTANEOUS
  Filled 2020-12-18 (×2): qty 0.04

## 2020-12-18 NOTE — Plan of Care (Signed)

## 2020-12-18 NOTE — Progress Notes (Signed)
PROGRESS NOTE    Tricia Huynh  JQB:[id number] DOB: Jan 18, 1956 DOA: 12/15/2020 PCP: Jonathon Jordan, MD   Chief Complain: Nausea, vomiting, diarrhea  Brief Narrative: Patient is a 29 female with history of type 1 diabetes mellitus, diabetic polyneuropathy, hyperlipidemia, hypertension, seizure disorder who presented at [facility] with complaints of nausea, vomiting and diarrhea for 3 days duration and associated abdomen pain.  CT abdomen/pelvis done in the emergency department showed colonic wall thickening of the hepatic flexure raising concern for colitis.  She was started on oral antibiotics but she was unable to tolerate oral intake and had persistent nausea and vomiting so was admitted.  On presentation, she was hypertensive, had hypokalemia. Her nausea, vomiting and diarrhea have improved .  Abdominal discomfort improving.  Diet advanced to full with goal to advance to soft later today and discharge tomorrow to home  Assessment & Plan:   Active Problems:   Colitis   Suspected colitis: Presented with nausea, vomiting, abdominal pain.  CT abdomen/pelvis finding as above.  No findings of diverticulitis.  Continue Zosyn for now.  Could not obtain GI pathogen panel, patient denies any diarrhea at the moment though.  Continue gentle IV fluids.  No fever or leukocytosis Complains of generalized abdominal discomfort but much better today.  Continue pain management, supportive care. Abdominal discomfort improving.  Diet advanced to full with goal to advance to soft later today and discharge tomorrow to home  Nausea/vomiting/abdominal pain: Most likely secondary to colitis.  Continue supportive care, gentle IV fluids.  These6 symptoms have been improving.  Hypertension: Continue home medications.  Use labetalol for severe hypertension.  Pyuria: Denies any dysuria.  She has history of E. coli UTI.  Urine culture showed insignificant growth  Type 1 diabetes mellitus:  Hemoglobin A1c of 9.2.  Continue sliding scale insulin and Lantus.  Monitor blood sugars  Hypokalemia: Being supplemented and monitored  CKD stage IIIa: Baseline creatinine 1.2.  Currently kidney function at baseline.  Avoid nephrotoxins  Diabetic peripheral neuropathy: On gabapentin  Depression/anxiety: On Zoloft and amitriptyline at home.   Nutrition Problem: Inadequate oral intake Etiology: altered GI function      DVT prophylaxis:Lovenox Code Status: Full Family Communication: None Status is: Observation   Dispo:  Patient From: Home  Planned Disposition: Home  Expected discharge date: 12/19/20  Medically stable for discharge: No     Consultants:   Procedures:  Antimicrobials:  Anti-infectives (From admission, onward)   Start     Dose/Rate Route Frequency Ordered Stop   12/15/20 2359  piperacillin-tazobactam (ZOSYN) IVPB 3.375 g        3.375 g 12.5 mL/hr over 240 Minutes Intravenous Every 8 hours 12/15/20 2329     12/15/20 2200  amoxicillin-clavulanate (AUGMENTIN) 875-125 MG per tablet 1 tablet  Status:  Discontinued        1 tablet Oral  Once 12/15/20 2159 12/15/20 2305   12/15/20 0000  amoxicillin-clavulanate (AUGMENTIN) 875-125 MG tablet        1 tablet Oral Every 8 hours 12/15/20 2207 12/22/20 2359      Subjective:   Patient seen and examined the bedside this morning.  Looks more comfortable today.  Had some nausea and abdominal discomfort but much better than yesterday.  She does not feel that she is ready to go home today but is ready to advance her diet to full.  Objective: Vitals:   12/17/20 1446 12/17/20 1956 12/18/20 0300 12/18/20 0735  BP: (!) 148/96 (!) 158/91 (!) 145/90 Marland Kitchen)  141/84  Pulse: 87 85 76 72  Resp: 17 18 17 17   Temp: 98.7 F (37.1 C) 98.6 F (37 C) 98.7 F (37.1 C) 98.5 F (36.9 C)  TempSrc: Oral Oral Oral Oral  SpO2: 100% 100% 99% 100%  Weight:      Height:        Intake/Output Summary (Last 24 hours) at 12/18/2020 1148 Last  data filed at 12/17/2020 1553 Gross per 24 hour  Intake 275 ml  Output -  Net 275 ml   Filed Weights   12/16/20 1358 12/16/20 2100  Weight: 63.5 kg 58.4 kg    Examination:  General exam: Appears calm and comfortable ,Not in distress,average built HEENT:PERRL,Oral mucosa moist, Ear/Nose normal on gross exam Respiratory system: Bilateral equal air entry, normal vesicular breath sounds, no wheezes or crackles  Cardiovascular system: S1 & S2 heard, RRR. No JVD, murmurs, rubs, gallops or clicks. Gastrointestinal system: Abdomen is nondistended, soft and is mostly nontender. No organomegaly or masses felt. Normal bowel sounds heard. Central nervous system: Alert and oriented. No focal neurological deficits. Extremities: No edema, no clubbing ,no cyanosis Skin: No rashes, lesions or ulcers,no icterus ,no pallor   Data Reviewed: I have personally reviewed following labs and imaging studies  CBC: Recent Labs  Lab 12/15/20 1122 12/18/20 0326  WBC 4.9 4.6  NEUTROABS  --  2.2  HGB 11.2* 10.1*  HCT 33.1* 29.2*  MCV 87.8 86.9  PLT 272 244   Basic Metabolic Panel: Recent Labs  Lab 12/15/20 1122 12/16/20 0936 12/17/20 0433 12/18/20 0326  NA 138 139 141 138  K 3.2* 3.6 3.6 3.8  CL 104 104 108 105  CO2 21* 25 22 23   GLUCOSE 201* 164* 79 93  BUN 16 9 6* 6*  CREATININE 1.28* 1.03* 1.04* 1.08*  CALCIUM 9.2 9.0 8.8* 9.1   GFR: Estimated Creatinine Clearance: 48.5 mL/min (A) (by C-G formula based on SCr of 1.08 mg/dL (H)). Liver Function Tests: Recent Labs  Lab 12/15/20 1122  AST 17  ALT 13  ALKPHOS 140*  BILITOT 0.7  PROT 7.7  ALBUMIN 3.5   Recent Labs  Lab 12/15/20 1122  LIPASE 44   No results for input(s): AMMONIA in the last 168 hours. Coagulation Profile: No results for input(s): INR, PROTIME in the last 168 hours. Cardiac Enzymes: No results for input(s): CKTOTAL, CKMB, CKMBINDEX, TROPONINI in the last 168 hours. BNP (last 3 results) No results for input(s):  PROBNP in the last 8760 hours. HbA1C: Recent Labs    12/16/20 0000  HGBA1C 9.2*   CBG: Recent Labs  Lab 12/17/20 1638 12/17/20 1954 12/18/20 0639 12/18/20 0733 12/18/20 1115  GLUCAP 94 131* 66* 110* 102*   Lipid Profile: No results for input(s): CHOL, HDL, LDLCALC, TRIG, CHOLHDL, LDLDIRECT in the last 72 hours. Thyroid Function Tests: No results for input(s): TSH, T4TOTAL, FREET4, T3FREE, THYROIDAB in the last 72 hours. Anemia Panel: No results for input(s): VITAMINB12, FOLATE, FERRITIN, TIBC, IRON, RETICCTPCT in the last 72 hours. Sepsis Labs: Recent Labs  Lab 12/16/20 0009  LATICACIDVEN 0.9    Recent Results (from the past 240 hour(s))  SARS CORONAVIRUS 2 (TAT 6-24 HRS) Nasopharyngeal Nasopharyngeal Swab     Status: None   Collection Time: 12/15/20  2:24 PM   Specimen: Nasopharyngeal Swab  Result Value Ref Range Status   SARS Coronavirus 2 NEGATIVE NEGATIVE Final    Comment: (NOTE) SARS-CoV-2 target nucleic acids are NOT DETECTED.  The SARS-CoV-2 RNA is generally detectable in upper  and lower respiratory specimens during the acute phase of infection. Negative results do not preclude SARS-CoV-2 infection, do not rule out co-infections with other pathogens, and should not be used as the sole basis for treatment or other patient management decisions. Negative results must be combined with clinical observations, patient history, and epidemiological information. The expected result is Negative.  Fact Sheet for Patients: SugarRoll.be  Fact Sheet for Healthcare Providers: https://www.woods-mathews.com/  This test is not yet approved or cleared by the [place] FDA and  has been authorized for detection and/or diagnosis of SARS-CoV-2 by FDA under an Emergency Use Authorization (EUA). This EUA will remain  in effect (meaning this test can be used) for the duration of the COVID-19 declaration under Se ction 564(b)(1) of the  Act, 21 U.S.C. section 360bbb-3(b)(1), unless the authorization is terminated or revoked sooner.  Performed at [facility], [place] [address], [place], [place] [address]   Urine culture     Status: Abnormal   Collection Time: 12/15/20  7:15 PM   Specimen: Urine, Random  Result Value Ref Range Status   Specimen Description URINE, RANDOM  Final   Special Requests NONE  Final   Culture (A)  Final    <10,000 COLONIES/mL INSIGNIFICANT GROWTH Performed at [facility], [place] [address], [place], [place] [address]    Report Status 12/17/2020 FINAL  Final         Radiology Studies: No results found.      Scheduled Meds: . (feeding supplement) PROSource Plus  30 mL Oral TID BM  . amLODipine  10 mg Oral Daily  . aspirin EC  81 mg Oral Daily  . atorvastatin  40 mg Oral Daily  . enoxaparin (LOVENOX) injection  40 mg Subcutaneous Q24H  . feeding supplement  1 Container Oral BID BM  . gabapentin  100 mg Oral TID  . insulin aspart  0-5 Units Subcutaneous QHS  . insulin aspart  0-6 Units Subcutaneous TID WC  . insulin glargine  4 Units Subcutaneous QHS  . metoprolol succinate  25 mg Oral QHS  . multivitamin with minerals  1 tablet Oral Daily  . pneumococcal 23 valent vaccine  0.5 mL Intramuscular Tomorrow-1000  . sertraline  50 mg Oral Daily   Continuous Infusions: . sodium chloride 75 mL/hr at 12/17/20 1339  . piperacillin-tazobactam (ZOSYN)  IV 3.375 g (12/18/20 0802)     LOS: 1 day    Time spent: 35 mins.More than 50% of that time was spent in counseling and/or coordination of care.      Shelly Coss, MD [facility] P2/11/2020, 11:48 AM

## 2020-12-18 NOTE — Progress Notes (Addendum)
Inpatient Diabetes Program Recommendations  AACE/ADA: New Consensus Statement on Inpatient Glycemic Control (2015)  Target Ranges:  Prepandial:   less than 140 mg/dL      Peak postprandial:   less than 180 mg/dL (1-2 hours)      Critically ill patients:  140 - 180 mg/dL   Lab Results  Component Value Date   GLUCAP 110 (H) 12/18/2020   HGBA1C 9.2 (H) 12/16/2020    Review of Glycemic Control Results for Tricia Huynh, Tricia Huynh (MRN [id number]) as of 12/18/2020 11:02  Ref. Range 12/17/2020 12:56 12/17/2020 16:38 12/17/2020 19:54 12/18/2020 06:39 12/18/2020 07:33  Glucose-Capillary Latest Ref Range: 70 - 99 mg/dL 103 (H) 94 131 (H) 66 (L) 110 (H)   Diabetes history: DM 2 Outpatient Diabetes medications:  Lantus 24 units q AM (per note in chart patient is taking Lantus 20 units bid) Current orders for Inpatient glycemic control:  Novolog 0-6 units tid with meals and HS scale Lantus 5 units daily Inpatient Diabetes Program Recommendations:   Note mild low CBG this morning. Consider reducing Lantus to 4 units daily?   Thanks,  Adah Perl, RN, BC-ADM Inpatient Diabetes Coordinator Pager [phone] (8a-5p)  Addendum 3:45p- Spoke with patient regarding home management of DM.  I asked if she had any low blood sugars at home and she states "yes".  Insulin was recently increased due to elevated A1C.  She takes Lantus twice a day.  She states that she has had a seizure in the past due to hypoglycemia.  We discussed the dangers of low blood sugars.  I encouraged patient to monitor blood sugars closely (at least 3 times a day)- Her goal blood sugars are 100-180 mg/dL.  Told patient that MD had reduced insulin substantially due to concerns of low blood sugars.  Explained that she should call PCP if blood sugars >200 mg/dL or <100 mg/dL at home. At d/c consider Lantus once a day and Novolog SSI that starts at 150 mg/dL.  History states she has Type 1 DM, however past history states Type 2 DM?

## 2020-12-18 NOTE — Plan of Care (Signed)

## 2020-12-18 NOTE — Progress Notes (Signed)
Hypoglycemic Event  CBG:66 Treatment: orange juice Symptoms:none Follow-up CBG: Time:0733 CBG Result:110  Possible Reasons for Event: not eating much Comments/MD notified:will do after follow up CBG check   Tricia Huynh

## 2020-12-19 LAB — GLUCOSE, CAPILLARY
Glucose-Capillary: 115 mg/dL — ABNORMAL HIGH (ref 70–99)
Glucose-Capillary: 156 mg/dL — ABNORMAL HIGH (ref 70–99)

## 2020-12-19 MED ORDER — ONDANSETRON HCL 4 MG PO TABS: 4.0000 mg | ORAL_TABLET | Freq: Three times a day (TID) | ORAL | 0 refills | Status: DC | PRN

## 2020-12-19 MED ORDER — AMOXICILLIN-POT CLAVULANATE 875-125 MG PO TABS: 1.0000 | ORAL_TABLET | Freq: Three times a day (TID) | ORAL | 0 refills | Status: AC

## 2020-12-19 NOTE — Progress Notes (Signed)
Pt given discharge instructions and gone over with her. She verbalized understanding. Pt waiting on family to pick her up. In no distress at discharge.

## 2020-12-19 NOTE — Discharge Summary (Signed)
Physician Discharge Summary  Tricia Huynh WIO:[id number] DOB: 11-25-1955 DOA: 12/15/2020  PCP: Jonathon Jordan, MD  Admit date: 12/15/2020 Discharge date: 12/19/2020  Admitted From: Home Disposition:  Home  Discharge Condition:Stable CODE STATUS:FULL Diet recommendation: Heart Healthy   Brief/Interim Summary:  Patient is a 69 female with history of type 1 diabetes mellitus, diabetic polyneuropathy, hyperlipidemia, hypertension, seizure disorder who presented at [facility] with complaints of nausea, vomiting and diarrhea for 3 days duration and associated abdomen pain.  CT abdomen/pelvis done in the emergency department showed colonic wall thickening of the hepatic flexure raising concern for colitis.  She was started on oral antibiotics but she was unable to tolerate oral intake and had persistent nausea and vomiting so was admitted.  On presentation, she was hypertensive, had hypokalemia. Her nausea, vomiting and diarrhea have improved .  Abdominal discomfort improving.  Diet advanced to soft and she is tolerating. She is stable for dc to home today with oral antibiotics.  Following problems were addressed during her hospitalization:  Suspected colitis: Presented with nausea, vomiting, abdominal pain.  CT abdomen/pelvis finding as above.  No findings of diverticulitis.  Continue Zosyn for now.  Could not obtain GI pathogen panel, patient did not have bowel movement No fever or leukocytosis Abdominal discomfort resolved.  Diet advanced to  soft .  Nausea/vomiting/abdominal pain: Most likely secondary to colitis.  resolved  Hypertension: Continue home medications.   Pyuria: Denies any dysuria.  She has history of E. coli UTI.  Urine culture showed insignificant growth  Type 1 diabetes mellitus: Hemoglobin A1c of 9.2.  Continue home regimen  Hypokalemia: supplemented  CKD stage IIIa: Baseline creatinine 1.2.  Currently kidney function at baseline.   Diabetic  peripheral neuropathy: On gabapentin  Depression/anxiety: On Zoloft and amitriptyline at home.    Discharge Diagnoses:  Active Problems:   Colitis    Discharge Instructions  Discharge Instructions    Diet - low sodium heart healthy   Complete by: As directed    Take soft diet for next 2-3  days   Discharge instructions   Complete by: As directed    1) Please follow up with your PCP in a week.  Follow-up with a gastroenterologist in 6 to 8 weeks for a colonoscopy.  Ask your PCP for the referral 2)Take prescribed medications as instructed. 3)Take soft diet for next 2-3 days   Increase activity slowly   Complete by: As directed      Allergies as of 12/19/2020      Reactions   Percocet [oxycodone-acetaminophen] Nausea And Vomiting, Other (See Comments)   Causes the patient to be shaky/unsteady, also   Vicodin [hydrocodone-acetaminophen] Nausea And Vomiting, Other (See Comments)   Causes the patient to be shaky/unsteady, also      Medication List    STOP taking these medications   cephALEXin 500 MG capsule Commonly known as: KEFLEX   ondansetron 4 MG disintegrating tablet Commonly known as: Zofran ODT     TAKE these medications   Accu-Chek Aviva Plus test strip Generic drug: glucose blood 1 each by Other route 2 (two) times daily. E11.9   Accu-Chek Aviva Plus w/Device Kit 1 each by Does not apply route 2 (two) times daily. E11.9   Accu-Chek Softclix Lancets lancets 1 each by Other route 2 (two) times daily. E11.9   acetaminophen 500 MG tablet Commonly known as: TYLENOL Take 500 mg by mouth every 6 (six) hours as needed for mild pain or headache.  amitriptyline 50 MG tablet Commonly known as: ELAVIL Take 50 mg by mouth at bedtime.   amLODipine-olmesartan 5-40 MG tablet Commonly known as: AZOR Take 1 tablet by mouth daily.   amoxicillin-clavulanate 875-125 MG tablet Commonly known as: AUGMENTIN Take 1 tablet by mouth every 8 (eight) hours for 7 days.    aspirin EC 81 MG tablet Take 81 mg by mouth daily.   atorvastatin 40 MG tablet Commonly known as: LIPITOR Take 40 mg by mouth daily.   gabapentin 100 MG capsule Commonly known as: NEURONTIN Take 100 mg by mouth 3 (three) times daily.   Lantus SoloStar 100 UNIT/ML Solostar Pen Generic drug: insulin glargine Inject 24 Units into the skin every morning. And pen needles 1/day What changed:   how much to take  when to take this  additional instructions   ondansetron 4 MG tablet Commonly known as: ZOFRAN Take 1 tablet (4 mg total) by mouth every 8 (eight) hours as needed for nausea or vomiting.   sertraline 50 MG tablet Commonly known as: ZOLOFT Take 50 mg by mouth daily.   vitamin E 180 MG (400 UNITS) capsule Take 400 Units by mouth daily.       Follow-up Information    Jonathon Jordan, MD. Schedule an appointment as soon as possible for a visit in 1 week(s).   Specialty: Family Medicine Why: Follow up with PCP in 1 week Contact information: [address] [place] [address] [phone]              Allergies  Allergen Reactions  . Percocet [Oxycodone-Acetaminophen] Nausea And Vomiting and Other (See Comments)    Causes the patient to be shaky/unsteady, also  . Vicodin [Hydrocodone-Acetaminophen] Nausea And Vomiting and Other (See Comments)    Causes the patient to be shaky/unsteady, also    Consultations:  None   Procedures/Studies: CT Abdomen Pelvis W Contrast  Result Date: 12/15/2020 CLINICAL DATA:  Abdominal pain and fever. Generalized weakness. Nausea, vomiting and diarrhea. EXAM: CT ABDOMEN AND PELVIS WITH CONTRAST TECHNIQUE: Multidetector CT imaging of the abdomen and pelvis was performed using the standard protocol following bolus administration of intravenous contrast. CONTRAST:  162m OMNIPAQUE IOHEXOL 300 MG/ML  SOLN COMPARISON:  CT 11/02/2019 FINDINGS: Lower chest: No acute or focal airspace disease. No pleural fluid.  Heart is normal in size. Hepatobiliary: No focal hepatic abnormality. Partially distended gallbladder without calcified gallstone or pericholecystic inflammation. Common bile duct upper normal at the porta hepatis measuring 7 mm, but normal tapering. Pancreas: No ductal dilatation or inflammation. Spleen: Normal in size without focal abnormality. Tiny splenule anteriorly. Adrenals/Urinary Tract: No adrenal nodule. No hydronephrosis or perinephric edema. Homogeneous renal enhancement with symmetric excretion on delayed phase imaging. There is scattered subcentimeter low-density lesions throughout both kidneys that are too small to characterize, but not significantly changed from prior exam. This includes a low-density lesion in the lower right kidney measuring 6 mm, series 8, image 22. Urinary bladder is physiologically distended without wall thickening. Stomach/Bowel: Bowel evaluation is limited in the absence of enteric contrast. Decompressed stomach, not well assessed. Normal positioning of the duodenum and ligament of Treitz. Decompressed small bowel without obstruction. No small bowel inflammation. Normal air-filled appendix in the right lower quadrant. Suggestion of colonic wall thickening from the hepatic flexure of the colon distally. Formed stool in the ascending colon, otherwise decompressed colon. Minimal sigmoid diverticulosis without diverticulitis. Vascular/Lymphatic: Abdominal aorta is normal in caliber. Patent portal vein. No acute vascular findings. No enlarged lymph  nodes in the abdomen or pelvis. Reproductive: Hysterectomy.  Quiescent ovaries.  No adnexal mass. Other: No ascites, free air or focal fluid collection. Musculoskeletal: There are no acute or suspicious osseous abnormalities. IMPRESSION: 1. Suggestion of colonic wall thickening from the hepatic flexure of the colon distally, suspicious for colitis. 2. Minimal sigmoid diverticulosis without diverticulitis. Electronically Signed   By:  Keith Rake M.D.   On: 12/15/2020 17:23       Subjective: Patient seen and examined the bedside this morning.  Hemodynamically stable for discharge today.  Discharge Exam: Vitals:   12/18/20 1957 12/19/20 0421  BP: 139/85 132/80  Pulse: 81 75  Resp: 18 17  Temp: 98.4 F (36.9 C) 98 F (36.7 C)  SpO2: 98% 99%   Vitals:   12/18/20 0735 12/18/20 1310 12/18/20 1957 12/19/20 0421  BP: (!) 141/84 (!) 154/98 139/85 132/80  Pulse: 72 79 81 75  Resp: 17 17 18 17   Temp: 98.5 F (36.9 C) 98.2 F (36.8 C) 98.4 F (36.9 C) 98 F (36.7 C)  TempSrc: Oral Oral Oral Oral  SpO2: 100% 100% 98% 99%  Weight:    59 kg  Height:        General: Pt is alert, awake, not in acute distress Cardiovascular: RRR, S1/S2 +, no rubs, no gallops Respiratory: CTA bilaterally, no wheezing, no rhonchi Abdominal: Soft, NT, ND, bowel sounds + Extremities: no edema, no cyanosis    The results of significant diagnostics from this hospitalization (including imaging, microbiology, ancillary and laboratory) are listed below for reference.     Microbiology: Recent Results (from the past 240 hour(s))  SARS CORONAVIRUS 2 (TAT 6-24 HRS) Nasopharyngeal Nasopharyngeal Swab     Status: None   Collection Time: 12/15/20  2:24 PM   Specimen: Nasopharyngeal Swab  Result Value Ref Range Status   SARS Coronavirus 2 NEGATIVE NEGATIVE Final    Comment: (NOTE) SARS-CoV-2 target nucleic acids are NOT DETECTED.  The SARS-CoV-2 RNA is generally detectable in upper and lower respiratory specimens during the acute phase of infection. Negative results do not preclude SARS-CoV-2 infection, do not rule out co-infections with other pathogens, and should not be used as the sole basis for treatment or other patient management decisions. Negative results must be combined with clinical observations, patient history, and epidemiological information. The expected result is Negative.  Fact Sheet for  Patients: SugarRoll.be  Fact Sheet for Healthcare Providers: https://www.woods-mathews.com/  This test is not yet approved or cleared by the [place] FDA and  has been authorized for detection and/or diagnosis of SARS-CoV-2 by FDA under an Emergency Use Authorization (EUA). This EUA will remain  in effect (meaning this test can be used) for the duration of the COVID-19 declaration under Se ction 564(b)(1) of the Act, 21 U.S.C. section 360bbb-3(b)(1), unless the authorization is terminated or revoked sooner.  Performed at [facility], [place] [address], [place], [place] [address]   Urine culture     Status: Abnormal   Collection Time: 12/15/20  7:15 PM   Specimen: Urine, Random  Result Value Ref Range Status   Specimen Description URINE, RANDOM  Final   Special Requests NONE  Final   Culture (A)  Final    <10,000 COLONIES/mL INSIGNIFICANT GROWTH Performed at [facility], [place] [address] [place], [place] [address]    Report Status 12/17/2020 FINAL  Final     Labs: BNP (last 3 results) No results for input(s): BNP in the last 8760 hours. Basic  Metabolic Panel: Recent Labs  Lab 12/15/20 1122 12/16/20 0936 12/17/20 0433 12/18/20 0326  NA 138 139 141 138  K 3.2* 3.6 3.6 3.8  CL 104 104 108 105  CO2 21* 25 22 23   GLUCOSE 201* 164* 79 93  BUN 16 9 6* 6*  CREATININE 1.28* 1.03* 1.04* 1.08*  CALCIUM 9.2 9.0 8.8* 9.1   Liver Function Tests: Recent Labs  Lab 12/15/20 1122  AST 17  ALT 13  ALKPHOS 140*  BILITOT 0.7  PROT 7.7  ALBUMIN 3.5   Recent Labs  Lab 12/15/20 1122  LIPASE 44   No results for input(s): AMMONIA in the last 168 hours. CBC: Recent Labs  Lab 12/15/20 1122 12/18/20 0326  WBC 4.9 4.6  NEUTROABS  --  2.2  HGB 11.2* 10.1*  HCT 33.1* 29.2*  MCV 87.8 86.9  PLT 272 234   Cardiac Enzymes: No results for input(s): CKTOTAL, CKMB, CKMBINDEX, TROPONINI in the last 168 hours. BNP: Invalid  input(s): POCBNP CBG: Recent Labs  Lab 12/18/20 1115 12/18/20 1603 12/18/20 1955 12/19/20 0817 12/19/20 1107  GLUCAP 102* 105* 193* 115* 156*   D-Dimer No results for input(s): DDIMER in the last 72 hours. Hgb A1c No results for input(s): HGBA1C in the last 72 hours. Lipid Profile No results for input(s): CHOL, HDL, LDLCALC, TRIG, CHOLHDL, LDLDIRECT in the last 72 hours. Thyroid function studies No results for input(s): TSH, T4TOTAL, T3FREE, THYROIDAB in the last 72 hours.  Invalid input(s): FREET3 Anemia work up No results for input(s): VITAMINB12, FOLATE, FERRITIN, TIBC, IRON, RETICCTPCT in the last 72 hours. Urinalysis    Component Value Date/Time   COLORURINE YELLOW 12/15/2020 1915   APPEARANCEUR HAZY (A) 12/15/2020 1915   LABSPEC 1.031 (H) 12/15/2020 [place] 5.0 12/15/2020 1915   GLUCOSEU 50 (A) 12/15/2020 1915   HGBUR SMALL (A) 12/15/2020 1915   BILIRUBINUR NEGATIVE 12/15/2020 1915   KETONESUR 20 (A) 12/15/2020 1915   PROTEINUR 100 (A) 12/15/2020 1915   UROBILINOGEN 0.2 09/18/2015 1327   NITRITE NEGATIVE 12/15/2020 1915   LEUKOCYTESUR LARGE (A) 12/15/2020 1915   Sepsis Labs Invalid input(s): PROCALCITONIN,  WBC,  LACTICIDVEN Microbiology Recent Results (from the past 240 hour(s))  SARS CORONAVIRUS 2 (TAT 6-24 HRS) Nasopharyngeal Nasopharyngeal Swab     Status: None   Collection Time: 12/15/20  2:24 PM   Specimen: Nasopharyngeal Swab  Result Value Ref Range Status   SARS Coronavirus 2 NEGATIVE NEGATIVE Final    Comment: (NOTE) SARS-CoV-2 target nucleic acids are NOT DETECTED.  The SARS-CoV-2 RNA is generally detectable in upper and lower respiratory specimens during the acute phase of infection. Negative results do not preclude SARS-CoV-2 infection, do not rule out co-infections with other pathogens, and should not be used as the sole basis for treatment or other patient management decisions. Negative results must be combined with clinical  observations, patient history, and epidemiological information. The expected result is Negative.  Fact Sheet for Patients: SugarRoll.be  Fact Sheet for Healthcare Providers: https://www.woods-mathews.com/  This test is not yet approved or cleared by the [place] FDA and  has been authorized for detection and/or diagnosis of SARS-CoV-2 by FDA under an Emergency Use Authorization (EUA). This EUA will remain  in effect (meaning this test can be used) for the duration of the COVID-19 declaration under Se ction 564(b)(1) of the Act, 21 U.S.C. section 360bbb-3(b)(1), unless the authorization is terminated or revoked sooner.  Performed at [facility], [place] [address], [place], [place]  [address]   Urine culture     Status: Abnormal   Collection Time: 12/15/20  7:15 PM   Specimen: Urine, Random  Result Value Ref Range Status   Specimen Description URINE, RANDOM  Final   Special Requests NONE  Final   Culture (A)  Final    <10,000 COLONIES/mL INSIGNIFICANT GROWTH Performed at [facility], [place] [address], [place], [place] [address]    Report Status 12/17/2020 FINAL  Final    Please note: You were cared for by a hospitalist during your hospital stay. Once you are discharged, your primary care physician will handle any further medical issues. Please note that NO REFILLS for any discharge medications will be authorized once you are discharged, as it is imperative that you return to your primary care physician (or establish a relationship with a primary care physician if you do not have one) for your post hospital discharge needs so that they can reassess your need for medications and monitor your lab values.    Time coordinating discharge: 40 minutes  SIGNED:   Shelly Coss, MD  [facility] 12/19/2020, 11:23 AM Pager [phone]  If 7PM-7AM, please contact night-coverage [email or web address] Password [email or web address]

## 2020-12-19 NOTE — Plan of Care (Signed)
  Problem: Clinical Measurements: Goal: Will remain free from infection Outcome: Adequate for Discharge   Problem: Activity: Goal: Risk for activity intolerance will decrease Outcome: Adequate for Discharge   Problem: Nutrition: Goal: Adequate nutrition will be maintained Outcome: Adequate for Discharge

## 2020-12-26 DIAGNOSIS — Z1159 Encounter for screening for other viral diseases: Secondary | ICD-10-CM | POA: Diagnosis not present

## 2020-12-26 DIAGNOSIS — Z0000 Encounter for general adult medical examination without abnormal findings: Secondary | ICD-10-CM | POA: Diagnosis not present

## 2020-12-26 DIAGNOSIS — R350 Frequency of micturition: Secondary | ICD-10-CM | POA: Diagnosis not present

## 2020-12-26 DIAGNOSIS — E1140 Type 2 diabetes mellitus with diabetic neuropathy, unspecified: Secondary | ICD-10-CM | POA: Diagnosis not present

## 2020-12-26 DIAGNOSIS — K529 Noninfective gastroenteritis and colitis, unspecified: Secondary | ICD-10-CM | POA: Diagnosis not present

## 2020-12-26 DIAGNOSIS — Z794 Long term (current) use of insulin: Secondary | ICD-10-CM | POA: Diagnosis not present

## 2021-01-10 ENCOUNTER — Other Ambulatory Visit: Payer: Self-pay

## 2021-01-10 ENCOUNTER — Emergency Department (HOSPITAL_COMMUNITY)
Admission: EM | Admit: 2021-01-10 | Discharge: 2021-01-11 | Disposition: A | Discharge status: Home / Self Care | Payer: Medicare HMO | Attending: Emergency Medicine | Admitting: Emergency Medicine

## 2021-01-10 ENCOUNTER — Encounter (HOSPITAL_COMMUNITY): Payer: Self-pay | Admitting: Emergency Medicine

## 2021-01-10 DIAGNOSIS — Z87891 Personal history of nicotine dependence: Secondary | ICD-10-CM | POA: Insufficient documentation

## 2021-01-10 DIAGNOSIS — R310 Gross hematuria: Secondary | ICD-10-CM | POA: Insufficient documentation

## 2021-01-10 DIAGNOSIS — E1165 Type 2 diabetes mellitus with hyperglycemia: Secondary | ICD-10-CM | POA: Insufficient documentation

## 2021-01-10 DIAGNOSIS — E1122 Type 2 diabetes mellitus with diabetic chronic kidney disease: Secondary | ICD-10-CM | POA: Insufficient documentation

## 2021-01-10 DIAGNOSIS — Z794 Long term (current) use of insulin: Secondary | ICD-10-CM | POA: Insufficient documentation

## 2021-01-10 DIAGNOSIS — E1140 Type 2 diabetes mellitus with diabetic neuropathy, unspecified: Secondary | ICD-10-CM | POA: Diagnosis not present

## 2021-01-10 DIAGNOSIS — Z7982 Long term (current) use of aspirin: Secondary | ICD-10-CM | POA: Insufficient documentation

## 2021-01-10 DIAGNOSIS — Z79899 Other long term (current) drug therapy: Secondary | ICD-10-CM | POA: Diagnosis not present

## 2021-01-10 DIAGNOSIS — R300 Dysuria: Secondary | ICD-10-CM | POA: Diagnosis not present

## 2021-01-10 DIAGNOSIS — R112 Nausea with vomiting, unspecified: Secondary | ICD-10-CM | POA: Diagnosis not present

## 2021-01-10 DIAGNOSIS — E1110 Type 2 diabetes mellitus with ketoacidosis without coma: Secondary | ICD-10-CM | POA: Insufficient documentation

## 2021-01-10 DIAGNOSIS — N183 Chronic kidney disease, stage 3 unspecified: Secondary | ICD-10-CM | POA: Insufficient documentation

## 2021-01-10 DIAGNOSIS — I129 Hypertensive chronic kidney disease with stage 1 through stage 4 chronic kidney disease, or unspecified chronic kidney disease: Secondary | ICD-10-CM | POA: Diagnosis not present

## 2021-01-10 LAB — URINALYSIS, ROUTINE W REFLEX MICROSCOPIC
Bacteria, UA: NONE SEEN
Bilirubin Urine: NEGATIVE
Glucose, UA: >=500 mg/dL — AB
Ketones, ur: NEGATIVE mg/dL
Nitrite: NEGATIVE
Protein, ur: >=300 mg/dL — AB
RBC / HPF: >50 RBC/hpf — ABNORMAL HIGH (ref 0–5)
Specific Gravity, Urine: 1.015 (ref 1.005–1.030)
WBC, UA: >50 WBC/hpf — ABNORMAL HIGH (ref 0–5)
pH: 6 (ref 5.0–8.0)

## 2021-01-10 LAB — COMPREHENSIVE METABOLIC PANEL
ALT: 12 U/L (ref 0–44)
AST: 16 U/L (ref 15–41)
Albumin: 3.2 g/dL — ABNORMAL LOW (ref 3.5–5.0)
Alkaline Phosphatase: 127 U/L — ABNORMAL HIGH (ref 38–126)
Anion gap: 8 (ref 5–15)
BUN: 14 mg/dL (ref 8–23)
CO2: 25 mmol/L (ref 22–32)
Calcium: 8.9 mg/dL (ref 8.9–10.3)
Chloride: 103 mmol/L (ref 98–111)
Creatinine, Ser: 1.3 mg/dL — ABNORMAL HIGH (ref 0.44–1.00)
GFR, Estimated: 46 mL/min — ABNORMAL LOW (ref >60)
Glucose, Bld: 257 mg/dL — ABNORMAL HIGH (ref 70–99)
Potassium: 3.8 mmol/L (ref 3.5–5.1)
Sodium: 136 mmol/L (ref 135–145)
Total Bilirubin: 0.6 mg/dL (ref 0.3–1.2)
Total Protein: 7.1 g/dL (ref 6.5–8.1)

## 2021-01-10 LAB — CBC
HCT: 30.5 % — ABNORMAL LOW (ref 36.0–46.0)
Hemoglobin: 9.7 g/dL — ABNORMAL LOW (ref 12.0–15.0)
MCH: 29 pg (ref 26.0–34.0)
MCHC: 31.8 g/dL (ref 30.0–36.0)
MCV: 91.3 fL (ref 80.0–100.0)
Platelets: 208 10*3/uL (ref 150–400)
RBC: 3.34 MIL/uL — ABNORMAL LOW (ref 3.87–5.11)
RDW: 13 % (ref 11.5–15.5)
WBC: 11.7 10*3/uL — ABNORMAL HIGH (ref 4.0–10.5)
nRBC: 0 % (ref 0.0–0.2)

## 2021-01-10 LAB — LIPASE, BLOOD: Lipase: 40 U/L (ref 11–51)

## 2021-01-10 NOTE — ED Triage Notes (Signed)
Pt c/o nausea and vomiting, chills, pain in her legs, pain with urination and she noticed blood on the toilet paper after wiping.

## 2021-01-11 ENCOUNTER — Other Ambulatory Visit: Payer: Self-pay

## 2021-01-11 DIAGNOSIS — R300 Dysuria: Secondary | ICD-10-CM | POA: Diagnosis not present

## 2021-01-11 MED ORDER — CEPHALEXIN 250 MG PO CAPS
500.0000 mg | ORAL_CAPSULE | Freq: Once | ORAL | Status: AC
  Administered 2021-01-11: 500 mg via ORAL
  Filled 2021-01-11: qty 2

## 2021-01-11 MED ORDER — ONDANSETRON 4 MG PO TBDP
4.0000 mg | ORAL_TABLET | Freq: Once | ORAL | Status: AC
  Administered 2021-01-11: 4 mg via ORAL
  Filled 2021-01-11: qty 1

## 2021-01-11 MED ORDER — CEPHALEXIN 500 MG PO CAPS: 500.0000 mg | ORAL_CAPSULE | Freq: Three times a day (TID) | ORAL | 0 refills | Status: AC

## 2021-01-11 NOTE — ED Notes (Signed)
Patient verbalizes understanding of discharge instructions. Prescriptions reviewed. Follow-up care reviewed. Opportunity for questioning and answers were provided. Armband removed by staff, pt discharged from ED ambulatory.

## 2021-01-11 NOTE — ED Provider Notes (Signed)
[facility] EMERGENCY DEPARTMENT Provider Note  CSN: [id number] Arrival date & time: 01/10/21 1736  Chief Complaint(s) Emesis  HPI Tricia Huynh is a 65 y.o. female with a past medical history listed below who presents to the emergency department with 1 day of dysuria. Patient reports that this started yesterday and has been with every void. Endorses blood when she wipes. Denies any vaginal bleeding or discharge. No vulvar rash. No abdominal pain. Reports nausea and vomiting yesterday but none today. Has been able to tolerate oral intake. Reports that she deals with constipation. Denies any fevers or chills. No other physical complaints.  HPI  Past Medical History Past Medical History:  Diagnosis Date  . Diabetes mellitus type 2 in nonobese ([place]) 06/06/2015  . Diabetes mellitus without complication ([place])   . Diabetic neuropathy ([place]) 06/06/2015  . Dyslipidemia 06/06/2015  . Encephalopathy   . Essential hypertension 06/06/2015  . Hypertension   . Mood disorder ([place]) 06/06/2015  . Pneumonia 03/11/2017  . Seizure [facility])    sees [name]. abd eeg, on keppra   Patient Active Problem List   Diagnosis Date Noted  . Colitis 12/15/2020  . HCAP (healthcare-associated pneumonia) 03/11/2017  . Dyslipidemia 03/11/2017  . Diabetes mellitus with complication ([place])   . Chest pain 12/30/2016  . Seizure disorder ([place]) 12/30/2016  . Diabetic ketoacidosis without coma associated with type 2 diabetes mellitus ([place])   . DKA (diabetic ketoacidoses) 11/15/2016  . UTI (urinary tract infection) 11/15/2016  . Acute cystitis without hematuria   . Acute renal failure ([place])   . Hyperglycemia 11/14/2016  . Diabetes ([place]) 06/05/2016  . Altered mental status   . CKD (chronic kidney disease) stage 3, GFR 30-59 ml/min (H[facility]) 05/26/2016  . Benign essential HTN 05/26/2016  . Seizures ([place]) 01/08/2016  . Memory loss 10/04/2015  . Acute encephalopathy 06/06/2015  . Diabetic  neuropathy ([place]) 06/06/2015   Home Medication(s) Prior to Admission medications   Medication Sig Start Date End Date Taking? Authorizing Provider  Accu-Chek Softclix Lancets lancets 1 each by Other route 2 (two) times daily. E11.9 12/20/19   Renato Shin, MD  acetaminophen (TYLENOL) 500 MG tablet Take 500 mg by mouth every 6 (six) hours as needed for mild pain or headache.    [provider]  amitriptyline (ELAVIL) 50 MG tablet Take 50 mg by mouth at bedtime. 05/08/15   [provider]  amLODipine-olmesartan (AZOR) 5-40 MG tablet Take 1 tablet by mouth daily. 10/08/16   [provider]  aspirin EC 81 MG tablet Take 81 mg by mouth daily.    [provider]  atorvastatin (LIPITOR) 40 MG tablet Take 40 mg by mouth daily. 12/23/19   [provider]  Blood Glucose Monitoring Suppl (ACCU-CHEK AVIVA PLUS) w/Device KIT 1 each by Does not apply route 2 (two) times daily. E11.9 12/20/19   Renato Shin, MD  gabapentin (NEURONTIN) 100 MG capsule Take 100 mg by mouth 3 (three) times daily. 12/23/19   [provider]  glucose blood (ACCU-CHEK AVIVA PLUS) test strip 1 each by Other route 2 (two) times daily. E11.9 12/20/19   Renato Shin, MD  Insulin Glargine (LANTUS SOLOSTAR) 100 UNIT/ML Solostar Pen Inject 24 Units into the skin every morning. And pen needles 1/day Patient taking differently: Inject 20 Units into the skin 2 (two) times daily. 12/20/19   Renato Shin, MD  ondansetron (ZOFRAN) 4 MG tablet Take 1 tablet (4 mg total) by mouth every 8 (eight) hours as needed for  nausea or vomiting. 12/19/20   Shelly Coss, MD  sertraline (ZOLOFT) 50 MG tablet Take 50 mg by mouth daily. 01/07/19   [provider]  vitamin E 400 UNIT capsule Take 400 Units by mouth daily.    [provider]  levETIRAcetam (KEPPRA) 750 MG tablet Take one tablet by mouth twice daily.  Please call [phone] to schedule yearly follow up appt. Patient not taking: Reported on  01/10/2020 08/04/19 02/17/20  Marcial Pacas, MD                                                                                                                                    Past Surgical History Past Surgical History:  Procedure Laterality Date  . ABDOMINAL HYSTERECTOMY    . EYE SURGERY Bilateral   . TUBAL LIGATION     Family History Family History  Problem Relation Age of Onset  . Heart disease Father   . Kidney disease Father   . Diabetes Mother   . Seizures Sister   . Seizures Brother     Social History Social History   Tobacco Use  . Smoking status: Former Smoker    Types: Cigarettes  . Smokeless tobacco: Never Used  Vaping Use  . Vaping Use: Never used  Substance Use Topics  . Alcohol use: No  . Drug use: No   Allergies Percocet [oxycodone-acetaminophen] and Vicodin [hydrocodone-acetaminophen]  Review of Systems Review of Systems All other systems are reviewed and are negative for acute change except as noted in the HPI  Physical Exam Vital Signs  I have reviewed the triage vital signs BP (!) 184/100   Pulse 69   Temp 98.4 F (36.9 C) (Oral)   Resp 20   Ht _0  (1.702 m)   Wt 58.1 kg   SpO2 100%   BMI 20.05 kg/m   Physical Exam Vitals reviewed.  Constitutional:      General: She is not in acute distress.    Appearance: She is well-developed and well-nourished. She is not diaphoretic.  HENT:     Head: Normocephalic and atraumatic.     Right Ear: External ear normal.     Left Ear: External ear normal.     Nose: Nose normal.  Eyes:     General: No scleral icterus.    Extraocular Movements: EOM normal.     Conjunctiva/sclera: Conjunctivae normal.  Neck:     Trachea: Phonation normal.  Cardiovascular:     Rate and Rhythm: Normal rate and regular rhythm.  Pulmonary:     Effort: Pulmonary effort is normal. No respiratory distress.     Breath sounds: No stridor.  Abdominal:     General: There is no distension.     Tenderness: There is no  abdominal tenderness. There is no right CVA tenderness, left CVA tenderness, guarding or rebound.  Musculoskeletal:        General: No edema. Normal range  of motion.     Cervical back: Normal range of motion.  Neurological:     Mental Status: She is alert and oriented to person, place, and time.  Psychiatric:        Mood and Affect: Mood and affect normal.        Behavior: Behavior normal.     ED Results and Treatments Labs (all labs ordered are listed, but only abnormal results are displayed) Labs Reviewed  COMPREHENSIVE METABOLIC PANEL - Abnormal; Notable for the following components:      Result Value   Glucose, Bld 257 (*)    Creatinine, Ser 1.30 (*)    Albumin 3.2 (*)    Alkaline Phosphatase 127 (*)    GFR, Estimated 46 (*)    All other components within normal limits  CBC - Abnormal; Notable for the following components:   WBC 11.7 (*)    RBC 3.34 (*)    Hemoglobin 9.7 (*)    HCT 30.5 (*)    All other components within normal limits  URINALYSIS, ROUTINE W REFLEX MICROSCOPIC - Abnormal; Notable for the following components:   APPearance CLOUDY (*)    Glucose, UA >=500 (*)    Hgb urine dipstick LARGE (*)    Protein, ur >=300 (*)    Leukocytes,Ua LARGE (*)    RBC / HPF >50 (*)    WBC, UA >50 (*)    Non Squamous Epithelial 0-5 (*)    All other components within normal limits  URINE CULTURE  LIPASE, BLOOD  GC/CHLAMYDIA PROBE AMP ([place]) NOT AT [facility]                                                                                                                         EKG  EKG Interpretation  Date/Time:    Ventricular Rate:    PR Interval:    QRS Duration:   QT Interval:    QTC Calculation:   R Axis:     Text Interpretation:        Radiology No results found.  Pertinent labs & imaging results that were available during my care of the patient were reviewed by me and considered in my medical decision making (see chart for details).  Medications  Ordered in ED Medications  cephALEXin (KEFLEX) capsule 500 mg (500 mg Oral Given 01/11/21 0054)  ondansetron (ZOFRAN-ODT) disintegrating tablet 4 mg (4 mg Oral Given 01/11/21 0054)  Procedures Procedures  (including critical care time)  Medical Decision Making / ED Course I have reviewed the nursing notes for this encounter and the patient's prior records (if available in EHR or on provided paperwork).   Tricia Huynh was evaluated in Emergency Department on 01/11/2021 for the symptoms described in the history of present illness. She was evaluated in the context of the global COVID-19 pandemic, which necessitated consideration that the patient might be at risk for infection with the SARS-CoV-2 virus that causes COVID-19. Institutional protocols and algorithms that pertain to the evaluation of patients at risk for COVID-19 are in a state of rapid change based on information released by regulatory bodies including the CDC and federal and state organizations. These policies and algorithms were followed during the patient's care in the ED.  Patient presents with dysuria. UA with large hematuria, WBCs, leukocytes, but no bacteria seen.  Will send for culture and also GC/chlamydia. Rest of the labs grossly reassuring.  Mild hyperglycemia without evidence of DKA. Will treat empirically with antibiotics and have patient follow-up with PCP to follow-up on cultures.      Final Clinical Impression(s) / ED Diagnoses Final diagnoses:  Dysuria  Gross hematuria   The patient appears reasonably screened and/or stabilized for discharge and I doubt any other medical condition or other [facility] requiring further screening, evaluation, or treatment in the ED at this time prior to discharge. Safe for discharge with strict return precautions.  Disposition:  Discharge  Condition: Good  I have discussed the results, Dx and Tx plan with the patient/family who expressed understanding and agree(s) with the plan. Discharge instructions discussed at length. The patient/family was given strict return precautions who verbalized understanding of the instructions. No further questions at time of discharge.    ED Discharge Orders    None      Follow Up: Jonathon Jordan, MD [address] [address] [phone]   in 3-5 days to follow up urine cultures      This chart was dictated using voice recognition software.  Despite best efforts to proofread,  errors can occur which can change the documentation meaning.   Fatima Blank, MD 01/11/21 [phone]

## 2021-01-13 LAB — URINE CULTURE: Culture: 80000 — AB

## 2021-01-14 NOTE — Progress Notes (Signed)
ED Antimicrobial Stewardship Positive Culture Follow Up   Tricia Huynh is an 65 y.o. female who presented to [facility] on 01/10/2021 with a chief complaint of dysuria Chief Complaint  Patient presents with  . Emesis    Recent Results (from the past 720 hour(s))  SARS CORONAVIRUS 2 (TAT 6-24 HRS) Nasopharyngeal Nasopharyngeal Swab     Status: None   Collection Time: 12/15/20  2:24 PM   Specimen: Nasopharyngeal Swab  Result Value Ref Range Status   SARS Coronavirus 2 NEGATIVE NEGATIVE Final    Comment: (NOTE) SARS-CoV-2 target nucleic acids are NOT DETECTED.  The SARS-CoV-2 RNA is generally detectable in upper and lower respiratory specimens during the acute phase of infection. Negative results do not preclude SARS-CoV-2 infection, do not rule out co-infections with other pathogens, and should not be used as the sole basis for treatment or other patient management decisions. Negative results must be combined with clinical observations, patient history, and epidemiological information. The expected result is Negative.  Fact Sheet for Patients: SugarRoll.be  Fact Sheet for Healthcare Providers: https://www.woods-mathews.com/  This test is not yet approved or cleared by the [place] FDA and  has been authorized for detection and/or diagnosis of SARS-CoV-2 by FDA under an Emergency Use Authorization (EUA). This EUA will remain  in effect (meaning this test can be used) for the duration of the COVID-19 declaration under Se ction 564(b)(1) of the Act, 21 U.S.C. section 360bbb-3(b)(1), unless the authorization is terminated or revoked sooner.  Performed at [facility], [place] [address], [place], [place] [address]   Urine culture     Status: Abnormal   Collection Time: 12/15/20  7:15 PM   Specimen: Urine, Random  Result Value Ref Range Status   Specimen Description URINE, RANDOM  Final   Special Requests NONE  Final    Culture (A)  Final    <10,000 COLONIES/mL INSIGNIFICANT GROWTH Performed at [facility], [place] [address], [place], [place] [address]    Report Status 12/17/2020 FINAL  Final  Urine culture     Status: Abnormal   Collection Time: 01/11/21 12:26 AM   Specimen: Urine, Random  Result Value Ref Range Status   Specimen Description URINE, RANDOM  Final   Special Requests   Final    NONE Performed at [facility], [place] [address] [place], [place] [address]    Culture 80,000 COLONIES/mL CITROBACTER FREUNDII (A)  Final   Report Status 01/13/2021 FINAL  Final   Organism ID, Bacteria CITROBACTER FREUNDII (A)  Final      Susceptibility   Citrobacter freundii - MIC*    CEFAZOLIN >=64 RESISTANT Resistant     CEFEPIME 0.5 SENSITIVE Sensitive     CEFTRIAXONE >=64 RESISTANT Resistant     CIPROFLOXACIN <=0.25 SENSITIVE Sensitive     GENTAMICIN <=1 SENSITIVE Sensitive     IMIPENEM <=0.25 SENSITIVE Sensitive     NITROFURANTOIN <=16 SENSITIVE Sensitive     TRIMETH/SULFA <=20 SENSITIVE Sensitive     PIP/TAZO >=128 RESISTANT Resistant     * 80,000 COLONIES/mL CITROBACTER FREUNDII    Patient presented with complaints of dysuria x1 day. Patient also endorses blood when she wipes and [place] the day prior. UA nitrate negative, WBC>50, protein>300. Patient was discharged on keflex. Culture and sensitivities show resistance to cefazolin. Recommend change from keflex to Macrobid.   New antibiotic prescription: Macrobid 100 MG BID for 5 days  ED Provider: Vidal Schwalbe Deaken Jurgens 01/14/2021, 9:33  AM Clinical Pharmacist Monday - Friday phone -  [phone] Saturday - Sunday phone - [phone]

## 2021-01-17 ENCOUNTER — Encounter (HOSPITAL_COMMUNITY): Payer: Self-pay

## 2021-01-17 ENCOUNTER — Other Ambulatory Visit: Payer: Self-pay

## 2021-01-17 ENCOUNTER — Emergency Department (HOSPITAL_COMMUNITY): Payer: Medicare HMO

## 2021-01-17 ENCOUNTER — Emergency Department (HOSPITAL_COMMUNITY)
Admission: EM | Admit: 2021-01-17 | Discharge: 2021-01-17 | Disposition: A | Discharge status: Home / Self Care | Payer: Medicare HMO | Attending: Emergency Medicine | Admitting: Emergency Medicine

## 2021-01-17 DIAGNOSIS — E1122 Type 2 diabetes mellitus with diabetic chronic kidney disease: Secondary | ICD-10-CM | POA: Insufficient documentation

## 2021-01-17 DIAGNOSIS — Z79899 Other long term (current) drug therapy: Secondary | ICD-10-CM | POA: Insufficient documentation

## 2021-01-17 DIAGNOSIS — R030 Elevated blood-pressure reading, without diagnosis of hypertension: Secondary | ICD-10-CM | POA: Diagnosis not present

## 2021-01-17 DIAGNOSIS — Z87891 Personal history of nicotine dependence: Secondary | ICD-10-CM | POA: Insufficient documentation

## 2021-01-17 DIAGNOSIS — Z7982 Long term (current) use of aspirin: Secondary | ICD-10-CM | POA: Insufficient documentation

## 2021-01-17 DIAGNOSIS — N183 Chronic kidney disease, stage 3 unspecified: Secondary | ICD-10-CM | POA: Insufficient documentation

## 2021-01-17 DIAGNOSIS — R1310 Dysphagia, unspecified: Secondary | ICD-10-CM | POA: Insufficient documentation

## 2021-01-17 DIAGNOSIS — R0902 Hypoxemia: Secondary | ICD-10-CM | POA: Diagnosis not present

## 2021-01-17 DIAGNOSIS — R1084 Generalized abdominal pain: Secondary | ICD-10-CM | POA: Diagnosis not present

## 2021-01-17 DIAGNOSIS — Z794 Long term (current) use of insulin: Secondary | ICD-10-CM | POA: Insufficient documentation

## 2021-01-17 DIAGNOSIS — K760 Fatty (change of) liver, not elsewhere classified: Secondary | ICD-10-CM | POA: Diagnosis not present

## 2021-01-17 DIAGNOSIS — E1165 Type 2 diabetes mellitus with hyperglycemia: Secondary | ICD-10-CM | POA: Diagnosis not present

## 2021-01-17 DIAGNOSIS — N3001 Acute cystitis with hematuria: Secondary | ICD-10-CM | POA: Diagnosis not present

## 2021-01-17 DIAGNOSIS — R1030 Lower abdominal pain, unspecified: Secondary | ICD-10-CM

## 2021-01-17 DIAGNOSIS — R109 Unspecified abdominal pain: Secondary | ICD-10-CM | POA: Diagnosis not present

## 2021-01-17 DIAGNOSIS — R100 Acute abdomen: Secondary | ICD-10-CM | POA: Diagnosis not present

## 2021-01-17 DIAGNOSIS — I10 Essential (primary) hypertension: Secondary | ICD-10-CM | POA: Diagnosis not present

## 2021-01-17 DIAGNOSIS — I129 Hypertensive chronic kidney disease with stage 1 through stage 4 chronic kidney disease, or unspecified chronic kidney disease: Secondary | ICD-10-CM | POA: Insufficient documentation

## 2021-01-17 LAB — CBC WITH DIFFERENTIAL/PLATELET
Abs Immature Granulocytes: 0.01 10*3/uL (ref 0.00–0.07)
Basophils Absolute: 0 10*3/uL (ref 0.0–0.1)
Basophils Relative: 0 %
Eosinophils Absolute: 0 10*3/uL (ref 0.0–0.5)
Eosinophils Relative: 0 %
HCT: 30.5 % — ABNORMAL LOW (ref 36.0–46.0)
Hemoglobin: 9.9 g/dL — ABNORMAL LOW (ref 12.0–15.0)
Immature Granulocytes: 0 %
Lymphocytes Relative: 13 %
Lymphs Abs: 0.8 10*3/uL (ref 0.7–4.0)
MCH: 29.2 pg (ref 26.0–34.0)
MCHC: 32.5 g/dL (ref 30.0–36.0)
MCV: 90 fL (ref 80.0–100.0)
Monocytes Absolute: 0.4 10*3/uL (ref 0.1–1.0)
Monocytes Relative: 6 %
Neutro Abs: 5.1 10*3/uL (ref 1.7–7.7)
Neutrophils Relative %: 81 %
Platelets: 249 10*3/uL (ref 150–400)
RBC: 3.39 MIL/uL — ABNORMAL LOW (ref 3.87–5.11)
RDW: 13 % (ref 11.5–15.5)
WBC: 6.3 10*3/uL (ref 4.0–10.5)
nRBC: 0 % (ref 0.0–0.2)

## 2021-01-17 LAB — LACTIC ACID, PLASMA: Lactic Acid, Venous: 1.1 mmol/L (ref 0.5–1.9)

## 2021-01-17 LAB — URINALYSIS, ROUTINE W REFLEX MICROSCOPIC
Bilirubin Urine: NEGATIVE
Glucose, UA: >=500 mg/dL — AB
Ketones, ur: 5 mg/dL — AB
Nitrite: NEGATIVE
Protein, ur: 100 mg/dL — AB
Specific Gravity, Urine: 1.026 (ref 1.005–1.030)
WBC, UA: >50 WBC/hpf — ABNORMAL HIGH (ref 0–5)
pH: 6 (ref 5.0–8.0)

## 2021-01-17 LAB — COMPREHENSIVE METABOLIC PANEL
ALT: 9 U/L (ref 0–44)
AST: 15 U/L (ref 15–41)
Albumin: 3.4 g/dL — ABNORMAL LOW (ref 3.5–5.0)
Alkaline Phosphatase: 113 U/L (ref 38–126)
Anion gap: 12 (ref 5–15)
BUN: 15 mg/dL (ref 8–23)
CO2: 23 mmol/L (ref 22–32)
Calcium: 9 mg/dL (ref 8.9–10.3)
Chloride: 106 mmol/L (ref 98–111)
Creatinine, Ser: 0.96 mg/dL (ref 0.44–1.00)
GFR, Estimated: >60 mL/min (ref >60)
Glucose, Bld: 293 mg/dL — ABNORMAL HIGH (ref 70–99)
Potassium: 3.2 mmol/L — ABNORMAL LOW (ref 3.5–5.1)
Sodium: 141 mmol/L (ref 135–145)
Total Bilirubin: 0.6 mg/dL (ref 0.3–1.2)
Total Protein: 7.5 g/dL (ref 6.5–8.1)

## 2021-01-17 LAB — LIPASE, BLOOD: Lipase: 59 U/L — ABNORMAL HIGH (ref 11–51)

## 2021-01-17 MED ORDER — SULFAMETHOXAZOLE-TRIMETHOPRIM 800-160 MG PO TABS: 1.0000 | ORAL_TABLET | Freq: Two times a day (BID) | ORAL | 0 refills | Status: AC

## 2021-01-17 MED ORDER — IOHEXOL 300 MG/ML  SOLN
100.0000 mL | Freq: Once | INTRAMUSCULAR | Status: AC | PRN
  Administered 2021-01-17: 100 mL via INTRAVENOUS

## 2021-01-17 MED ORDER — SULFAMETHOXAZOLE-TRIMETHOPRIM 800-160 MG PO TABS
1.0000 | ORAL_TABLET | Freq: Once | ORAL | Status: AC
  Administered 2021-01-17: 1 via ORAL
  Filled 2021-01-17: qty 1

## 2021-01-17 MED ORDER — ONDANSETRON HCL 4 MG/2ML IJ SOLN
4.0000 mg | Freq: Once | INTRAMUSCULAR | Status: AC
  Administered 2021-01-17: 4 mg via INTRAVENOUS
  Filled 2021-01-17: qty 2

## 2021-01-17 MED ORDER — LIDOCAINE VISCOUS HCL 2 % MT SOLN
15.0000 mL | Freq: Once | OROMUCOSAL | Status: AC
  Administered 2021-01-17: 15 mL via ORAL
  Filled 2021-01-17: qty 15

## 2021-01-17 MED ORDER — SODIUM CHLORIDE 0.9 % IV BOLUS
1000.0000 mL | Freq: Once | INTRAVENOUS | Status: AC
  Administered 2021-01-17: 1000 mL via INTRAVENOUS

## 2021-01-17 MED ORDER — POTASSIUM CHLORIDE CRYS ER 20 MEQ PO TBCR
40.0000 meq | EXTENDED_RELEASE_TABLET | Freq: Once | ORAL | Status: AC
  Administered 2021-01-17: 40 meq via ORAL
  Filled 2021-01-17: qty 2

## 2021-01-17 MED ORDER — ONDANSETRON 4 MG PO TBDP: 4.0000 mg | ORAL_TABLET | Freq: Three times a day (TID) | ORAL | 0 refills | Status: DC | PRN

## 2021-01-17 MED ORDER — ALUM & MAG HYDROXIDE-SIMETH 200-200-20 MG/5ML PO SUSP
30.0000 mL | Freq: Once | ORAL | Status: AC
  Administered 2021-01-17: 30 mL via ORAL
  Filled 2021-01-17: qty 30

## 2021-01-17 MED ORDER — LABETALOL HCL 5 MG/ML IV SOLN
10.0000 mg | Freq: Once | INTRAVENOUS | Status: AC
  Administered 2021-01-17: 10 mg via INTRAVENOUS
  Filled 2021-01-17: qty 4

## 2021-01-17 NOTE — ED Provider Notes (Signed)
Tricia Huynh Provider Note   CSN: [id number] Arrival date & time: 01/17/21  1402     History Chief Complaint  Patient presents with   Abdominal Pain    Tricia Huynh is a 65 y.o. female with a past medical history significant for type 1 diabetes mellitus, diabetic polyneuropathy, hyperlipidemia, hypertension, and seizure disorder who presents to the ED due to persistent bilateral lower abdominal pain that has been ongoing for the past few months.  Abdominal pain associated with nausea and dysuria.  Patient admits to decreased p.o. intake because she states she feels "full" after eating small amounts.  Chart reviewed.  Patient was admitted to the hospital 1/29-12/19/2020 due to colitis and intractable emesis. She was also evaluated in the ED on 2/24 for dysuria and treated with keflex for a suspected UTI.  She notes she has been compliant with Keflex however, states her symptoms have not improved. Urine culture positive for 80,000 colonies of Citrobacter freundii which is resistant to cefazoline and ceftriaxone. Denies vaginal symptoms. She notes she has not been sexually active in numerous years with no concern for STDs. Denies fever and chills. Abdominal pain also associated with constipation.  Last bowel movement last night.  Patient she typically has to strain for bowel movements over the past few days.  Denies melena, hematochezia, and hematemesis.  Patient also admits to dysphagia for the past 1 to 2 days.  She notes she took her medicine at night 2 days ago and woke up with dysphagia. Dysphagia is related to solid foods not liquids. Denies foreign body sensation in throat. Denies changes to phonation and shortness of breath.   History obtained from patient and past medical records. No interpreter used during encounter.      Past Medical History:  Diagnosis Date   Diabetes mellitus type 2 in nonobese ([place]) 06/06/2015   Diabetes mellitus without  complication ([place])    Diabetic neuropathy ([place]) 06/06/2015   Dyslipidemia 06/06/2015   Encephalopathy    Essential hypertension 06/06/2015   Hypertension    Mood disorder ([place]) 06/06/2015   Pneumonia 03/11/2017   Seizure ([place])    sees Tricia Huynh. abd eeg, on keppra    Patient Active Problem List   Diagnosis Date Noted   Colitis 12/15/2020   HCAP (healthcare-associated pneumonia) 03/11/2017   Dyslipidemia 03/11/2017   Diabetes mellitus with complication ([place])    Chest pain 12/30/2016   Seizure disorder ([place]) 12/30/2016   Diabetic ketoacidosis without coma associated with type 2 diabetes mellitus ([place])    DKA (diabetic ketoacidoses) 11/15/2016   UTI (urinary tract infection) 11/15/2016   Acute cystitis without hematuria    Acute renal failure ([facility])    Hyperglycemia 11/14/2016   Diabetes ([place]) 06/05/2016   Altered mental status    CKD (chronic kidney disease) stage 3, GFR 30-59 ml/min ([place]) 05/26/2016   Benign essential HTN 05/26/2016   Seizures ([place]) 01/08/2016   Memory loss 10/04/2015   Acute encephalopathy 06/06/2015   Diabetic neuropathy ([place]) 06/06/2015    Past Surgical History:  Procedure Laterality Date   ABDOMINAL HYSTERECTOMY     EYE SURGERY Bilateral    TUBAL LIGATION       OB History   No obstetric history on file.     Family History  Problem Relation Age of Onset   Heart disease Father    Kidney disease Father    Diabetes Mother    Seizures Sister    Seizures Brother     Social  History   Tobacco Use   Smoking status: Former Smoker    Types: Cigarettes   Smokeless tobacco: Never Used  Building services engineer Use: Never used  Substance Use Topics   Alcohol use: No   Drug use: No    Home Medications Prior to Admission medications   Medication Sig Start Date End Date Taking? Authorizing Provider  ondansetron (ZOFRAN ODT) 4 MG disintegrating tablet Take 1 tablet (4 mg total) by mouth every 8 (eight) hours as needed  for nausea or vomiting. 01/17/21  Yes Antoni Stefan C, PA-C  sulfamethoxazole-trimethoprim (BACTRIM DS) 800-160 MG tablet Take 1 tablet by mouth 2 (two) times daily for 5 days. 01/17/21 01/22/21 Yes Wyley Hack, Merla Riches, PA-C  Accu-Chek Softclix Lancets lancets 1 each by Other route 2 (two) times daily. E11.9 12/20/19   Romero Belling, MD  acetaminophen (TYLENOL) 500 MG tablet Take 500 mg by mouth every 6 (six) hours as needed for mild pain or headache.    [provider]  amitriptyline (ELAVIL) 50 MG tablet Take 50 mg by mouth at bedtime. 05/08/15   [provider]  amLODipine-olmesartan (AZOR) 5-40 MG tablet Take 1 tablet by mouth daily. 10/08/16   [provider]  aspirin EC 81 MG tablet Take 81 mg by mouth daily.    [provider]  atorvastatin (LIPITOR) 40 MG tablet Take 40 mg by mouth daily. 12/23/19   [provider]  Blood Glucose Monitoring Suppl (ACCU-CHEK AVIVA PLUS) w/Device KIT 1 each by Does not apply route 2 (two) times daily. E11.9 12/20/19   Romero Belling, MD  cephALEXin (KEFLEX) 500 MG capsule Take 1 capsule (500 mg total) by mouth 3 (three) times daily for 7 days. 01/11/21 01/18/21  Nira Conn, MD  gabapentin (NEURONTIN) 100 MG capsule Take 100 mg by mouth 3 (three) times daily. 12/23/19   [provider]  glucose blood (ACCU-CHEK AVIVA PLUS) test strip 1 each by Other route 2 (two) times daily. E11.9 12/20/19   Romero Belling, MD  Insulin Glargine (LANTUS SOLOSTAR) 100 UNIT/ML Solostar Pen Inject 24 Units into the skin every morning. And pen needles 1/day Patient taking differently: Inject 20 Units into the skin 2 (two) times daily. 12/20/19   Romero Belling, MD  ondansetron (ZOFRAN) 4 MG tablet Take 1 tablet (4 mg total) by mouth every 8 (eight) hours as needed for nausea or vomiting. 12/19/20   Burnadette Pop, MD  sertraline (ZOLOFT) 50 MG tablet Take 50 mg by mouth daily. 01/07/19   [provider]  vitamin E 400 UNIT capsule  Take 400 Units by mouth daily.    [provider]  levETIRAcetam (KEPPRA) 750 MG tablet Take one tablet by mouth twice daily.  Please call [phone] to schedule yearly follow up appt. Patient not taking: Reported on 01/10/2020 08/04/19 02/17/20  Levert Feinstein, MD    Allergies    Percocet [oxycodone-acetaminophen] and Vicodin [hydrocodone-acetaminophen]  Review of Systems   Review of Systems  Constitutional: Negative for chills and fever.  Respiratory: Negative for shortness of breath.   Cardiovascular: Negative for chest pain.  Gastrointestinal: Positive for abdominal pain, constipation, nausea and vomiting. Negative for diarrhea.  Genitourinary: Positive for dysuria. Negative for vaginal discharge.  All other systems reviewed and are negative.   Physical Exam Updated Vital Signs BP (!) 170/98    Pulse 76    Temp 98.4 F (36.9 C) (Oral)    Resp 12    SpO2 99%   Physical Exam  Vitals and nursing note reviewed.  Constitutional:      General: She is not in acute distress. HENT:     Head: Normocephalic.     Mouth/Throat:     Pharynx: No oropharyngeal exudate or posterior oropharyngeal erythema.     Comments: Dry mucus membranes.  No tonsillar hypertrophy.  Uvula midline.  Patient tolerating oral secretions without difficulty.  Normal phonation. Eyes:     Pupils: Pupils are equal, round, and reactive to light.  Cardiovascular:     Rate and Rhythm: Normal rate and regular rhythm.     Pulses: Normal pulses.     Heart sounds: Normal heart sounds. No murmur heard. No friction rub. No gallop.   Pulmonary:     Effort: Pulmonary effort is normal.     Breath sounds: Normal breath sounds.  Abdominal:     General: Abdomen is flat. Bowel sounds are normal. There is no distension.     Palpations: Abdomen is soft.     Tenderness: There is abdominal tenderness. There is no guarding or rebound.     Comments: Diffuse abdominal tenderness most significant in lower quadrants.  No rebound or  guarding.  Musculoskeletal:        General: Normal range of motion.     Cervical back: Neck supple.  Skin:    General: Skin is warm and dry.  Neurological:     General: No focal deficit present.     ED Results / Procedures / Treatments   Labs (all labs ordered are listed, but only abnormal results are displayed) Labs Reviewed  CBC WITH DIFFERENTIAL/PLATELET - Abnormal; Notable for the following components:      Result Value   RBC 3.39 (*)    Hemoglobin 9.9 (*)    HCT 30.5 (*)    All other components within normal limits  COMPREHENSIVE METABOLIC PANEL - Abnormal; Notable for the following components:   Potassium 3.2 (*)    Glucose, Bld 293 (*)    Albumin 3.4 (*)    All other components within normal limits  LIPASE, BLOOD - Abnormal; Notable for the following components:   Lipase 59 (*)    All other components within normal limits  URINALYSIS, ROUTINE W REFLEX MICROSCOPIC - Abnormal; Notable for the following components:   Color, Urine STRAW (*)    APPearance HAZY (*)    Glucose, UA >=500 (*)    Hgb urine dipstick MODERATE (*)    Ketones, ur 5 (*)    Protein, ur 100 (*)    Leukocytes,Ua LARGE (*)    WBC, UA >50 (*)    Bacteria, UA RARE (*)    All other components within normal limits  LACTIC ACID, PLASMA    EKG None  Radiology CT ABDOMEN PELVIS W CONTRAST  Result Date: 01/17/2021 CLINICAL DATA:  Nonlocalized abdominal pain EXAM: CT ABDOMEN AND PELVIS WITH CONTRAST TECHNIQUE: Multidetector CT imaging of the abdomen and pelvis was performed using the standard protocol following bolus administration of intravenous contrast. CONTRAST:  189m OMNIPAQUE IOHEXOL 300 MG/ML  SOLN COMPARISON:  None. FINDINGS: Lower chest: The visualized heart size within normal limits. No pericardial fluid/thickening. No hiatal hernia. The visualized portions of the lungs are clear. Hepatobiliary: There is diffuse low density seen throughout the liver parenchyma.The main portal vein is patent. No  evidence of calcified gallstones, gallbladder wall thickening or biliary dilatation. Pancreas: Unremarkable. No pancreatic ductal dilatation or surrounding inflammatory changes. Spleen: Normal in size without focal abnormality. Adrenals/Urinary Tract: Both adrenal glands  appear normal. Small low-density lesions are seen within both kidneys, too small to further evaluate, however likely simple renal cysts. No renal or collecting system calculi. No hydronephrosis. There appears to be mild wall thickening seen diffusely within the bladder. Stomach/Bowel: The stomach, small bowel, and colon are normal in appearance. No inflammatory changes, wall thickening, or obstructive findings.Scattered colonic diverticula are seen without diverticulitis. Vascular/Lymphatic: There are no enlarged mesenteric, retroperitoneal, or pelvic lymph nodes. No significant vascular findings are present. Reproductive: The patient is status post hysterectomy. No adnexal masses or collections seen. Other: No evidence of abdominal wall mass or hernia. Musculoskeletal: No acute or significant osseous findings. IMPRESSION: Diverticulosis without diverticulitis. Findings which may be suggestive of mild cystitis. Hepatic steatosis Electronically Signed   By: Prudencio Pair M.D.   On: 01/17/2021 17:37    Procedures Procedures   Medications Ordered in ED Medications  alum & mag hydroxide-simeth (MAALOX/MYLANTA) 200-200-20 MG/5ML suspension 30 mL (30 mLs Oral Given 01/17/21 1503)    And  lidocaine (XYLOCAINE) 2 % viscous mouth solution 15 mL (15 mLs Oral Given 01/17/21 1503)  sodium chloride 0.9 % bolus 1,000 mL (0 mLs Intravenous Stopped 01/17/21 1653)  potassium chloride SA (KLOR-CON) CR tablet 40 mEq (40 mEq Oral Given 01/17/21 1653)  labetalol (NORMODYNE) injection 10 mg (10 mg Intravenous Given 01/17/21 1653)  iohexol (OMNIPAQUE) 300 MG/ML solution 100 mL (100 mLs Intravenous Contrast Given 01/17/21 1710)  sulfamethoxazole-trimethoprim (BACTRIM DS)  800-160 MG per tablet 1 tablet (1 tablet Oral Given 01/17/21 1854)    ED Course  I have reviewed the triage vital signs and the nursing notes.  Pertinent labs & imaging results that were available during my care of the patient were reviewed by me and considered in my medical decision making (see chart for details).  Clinical Course as of 01/17/21 1951  Thu Jan 17, 2021  1422 BP(!): 185/104 [CA]  1554 Glucose(!): 293 [CA]    Clinical Course User Index [CA] Suzy Bouchard, PA-C   MDM Rules/Calculators/A&P                         65 year old female presents to the ED due to persistent lower abdominal pain x1.5 months associated with nausea, vomiting, and dysuria.  Patient recently admitted the hospital on 1/29-2/2 due to colitis and intractable nausea and vomiting.  He was also seen in the ED on 2/24 and treated for a possible acute cystitis with Keflex.  Urine culture demonstrated 80,000 colonies of citrobacter freundii resistant to ceftriaxone and cefazolin.  Patient also admits to dysphagia for the past 1 to 2 days after taking her nightly medications. Dysphagia for solids, not liquids. Denies foreign body sensation.  Upon arrival, stable vitals.  BP elevated 185/104.  Will continue to monitor.  Patient in no distress and nontoxic-appearing.  Physical exam significant for generalized abdominal tenderness with no rebound or guarding.  Dry mucous membranes. No throat erythema or exudates. Normal phonation. Tolerating oral secretions without difficulty. Suspect dysphagia related to pill induced esophagitis. No signs of abscess. Low suspicion for acute abdomen. Will hold on CT abdomen given last CT scan on 12/15/2020. Routine labs to rule out infectious etiology and electrolyte derangements. IV fluids given due to dry mucus membranes. GI cocktail for esophagitis.   CBC reassuring with no leukocytosis. Anemia with hemoglobin at 9.9 which appears to be around patient's baseline.  CMP significant for  hypokalemia at 3.2, hyperglycemia 293 with no anion gap.  Doubt  DKA.  Normal renal function.  Lipase mildly elevated at 59. Patient not overly tender in epigastric region. Low suspicion for pancreatitis. Lactic acid normal at 1.1.  4:35 PM BP continues to uptrend. IV labetalol given. CT abdomen ordered due to new increase in lipase.   CT abdomen personally reviewed which demonstrates:   IMPRESSION:  Diverticulosis without diverticulitis.    Findings which may be suggestive of mild cystitis.    Hepatic steatosis   Suspect continued dysuria and CT exam findings likely due to untreated acute cystitis. Urine culture demonstrates resistance to antibiotic patient previously given. Bactrim given.  UA significant for large leukocytes, proteinuria, ketonuria, glucosuria, moderate hematuria.  Rare bacteria. Patient tolerate po here in the ED without difficulty. Doubt food bolus impaction. Suspect dysphagia related to pill induced esophagitis. Patient discharged with Bactrim for acute cystitis and zofran as needed for nausea.  Advised patient take over-the-counter MiraLAX as needed for constipation.  Instructed patient to follow-up with PCP if symptoms not improve over the next few days. Strict ED precautions discussed with patient. Patient states understanding and agrees to plan. Patient discharged home in no acute distress and stable vitals  Discussed case with Dr. Dina Rich who agrees with assessment and plan.  Final Clinical Impression(s) / ED Diagnoses Final diagnoses:  Lower abdominal pain  Acute cystitis with hematuria  Elevated blood pressure reading    Rx / DC Orders ED Discharge Orders         Ordered    sulfamethoxazole-trimethoprim (BACTRIM DS) 800-160 MG tablet  2 times daily        01/17/21 1820    ondansetron (ZOFRAN ODT) 4 MG disintegrating tablet  Every 8 hours PRN        01/17/21 1946           Karie Kirks 01/17/21 1952    Lorelle Gibbs, DO 01/17/21  2311

## 2021-01-17 NOTE — ED Triage Notes (Signed)
Pt BIB EMS from home. Pt reports acute lower abdominal pain and vomiting. Pt reports on and off abdominal issues for the past few months. Pt reports difficulty with BMs.   BP 210/100 now 160/110 174mcg Fentanyl 18G RAC CBG 302, pt has not been taking insulin stating that she has not eaten anything

## 2021-01-17 NOTE — Discharge Instructions (Addendum)
As discussed, your lower abdominal pain is most likely related to your urinary tract infection.  Stop taking Keflex and take Bactrim as prescribed.  Finish all antibiotics.  I am also sending you home with nausea medication.  Take as needed.  Please follow-up with PCP if symptoms not improve over the next week.  Return to the ER for new or worsening symptoms.  You may also take over the counter Miralax as needed for constipation. Your blood pressure was elevated here in the ED. Continue to take your BP medications as prescribed. Follow-up with PCP to recheck BP in 1 week.

## 2021-01-17 NOTE — ED Notes (Addendum)
Pt given crackers and states they made her nauseous.

## 2021-01-17 NOTE — ED Notes (Signed)
Pt given water and able to keep down without difficulty.

## 2021-01-17 NOTE — ED Notes (Signed)
Pt transported to CT ?

## 2021-02-10 ENCOUNTER — Emergency Department (HOSPITAL_COMMUNITY)
Admission: EM | Admit: 2021-02-10 | Discharge: 2021-02-10 | Disposition: A | Discharge status: Home / Self Care | Payer: Medicare HMO | Attending: Emergency Medicine | Admitting: Emergency Medicine

## 2021-02-10 DIAGNOSIS — G40909 Epilepsy, unspecified, not intractable, without status epilepticus: Secondary | ICD-10-CM | POA: Diagnosis not present

## 2021-02-10 DIAGNOSIS — R000 Tachycardia, unspecified: Secondary | ICD-10-CM | POA: Diagnosis not present

## 2021-02-10 DIAGNOSIS — E1110 Type 2 diabetes mellitus with ketoacidosis without coma: Secondary | ICD-10-CM | POA: Insufficient documentation

## 2021-02-10 DIAGNOSIS — I129 Hypertensive chronic kidney disease with stage 1 through stage 4 chronic kidney disease, or unspecified chronic kidney disease: Secondary | ICD-10-CM | POA: Diagnosis not present

## 2021-02-10 DIAGNOSIS — Z7982 Long term (current) use of aspirin: Secondary | ICD-10-CM | POA: Diagnosis not present

## 2021-02-10 DIAGNOSIS — Z87891 Personal history of nicotine dependence: Secondary | ICD-10-CM | POA: Diagnosis not present

## 2021-02-10 DIAGNOSIS — R410 Disorientation, unspecified: Secondary | ICD-10-CM | POA: Diagnosis not present

## 2021-02-10 DIAGNOSIS — N183 Chronic kidney disease, stage 3 unspecified: Secondary | ICD-10-CM | POA: Diagnosis not present

## 2021-02-10 DIAGNOSIS — E1140 Type 2 diabetes mellitus with diabetic neuropathy, unspecified: Secondary | ICD-10-CM | POA: Insufficient documentation

## 2021-02-10 DIAGNOSIS — E1165 Type 2 diabetes mellitus with hyperglycemia: Secondary | ICD-10-CM | POA: Diagnosis not present

## 2021-02-10 DIAGNOSIS — Z79899 Other long term (current) drug therapy: Secondary | ICD-10-CM | POA: Insufficient documentation

## 2021-02-10 DIAGNOSIS — R569 Unspecified convulsions: Secondary | ICD-10-CM | POA: Diagnosis not present

## 2021-02-10 DIAGNOSIS — R404 Transient alteration of awareness: Secondary | ICD-10-CM | POA: Diagnosis not present

## 2021-02-10 DIAGNOSIS — Z794 Long term (current) use of insulin: Secondary | ICD-10-CM | POA: Diagnosis not present

## 2021-02-10 LAB — BASIC METABOLIC PANEL
Anion gap: 6 (ref 5–15)
BUN: 18 mg/dL (ref 8–23)
CO2: 24 mmol/L (ref 22–32)
Calcium: 8.7 mg/dL — ABNORMAL LOW (ref 8.9–10.3)
Chloride: 106 mmol/L (ref 98–111)
Creatinine, Ser: 1.3 mg/dL — ABNORMAL HIGH (ref 0.44–1.00)
GFR, Estimated: 46 mL/min — ABNORMAL LOW (ref >60)
Glucose, Bld: 336 mg/dL — ABNORMAL HIGH (ref 70–99)
Potassium: 4.5 mmol/L (ref 3.5–5.1)
Sodium: 136 mmol/L (ref 135–145)

## 2021-02-10 LAB — CBC WITH DIFFERENTIAL/PLATELET
Abs Immature Granulocytes: 0.01 10*3/uL (ref 0.00–0.07)
Basophils Absolute: 0 10*3/uL (ref 0.0–0.1)
Basophils Relative: 0 %
Eosinophils Absolute: 0 10*3/uL (ref 0.0–0.5)
Eosinophils Relative: 0 %
HCT: 26 % — ABNORMAL LOW (ref 36.0–46.0)
Hemoglobin: 8.4 g/dL — ABNORMAL LOW (ref 12.0–15.0)
Immature Granulocytes: 0 %
Lymphocytes Relative: 31 %
Lymphs Abs: 1.4 10*3/uL (ref 0.7–4.0)
MCH: 29.8 pg (ref 26.0–34.0)
MCHC: 32.3 g/dL (ref 30.0–36.0)
MCV: 92.2 fL (ref 80.0–100.0)
Monocytes Absolute: 0.4 10*3/uL (ref 0.1–1.0)
Monocytes Relative: 9 %
Neutro Abs: 2.7 10*3/uL (ref 1.7–7.7)
Neutrophils Relative %: 60 %
Platelets: 208 10*3/uL (ref 150–400)
RBC: 2.82 MIL/uL — ABNORMAL LOW (ref 3.87–5.11)
RDW: 13.5 % (ref 11.5–15.5)
WBC: 4.5 10*3/uL (ref 4.0–10.5)
nRBC: 0 % (ref 0.0–0.2)

## 2021-02-10 MED ORDER — LEVETIRACETAM 500 MG PO TABS: 500.0000 mg | ORAL_TABLET | Freq: Two times a day (BID) | ORAL | 2 refills | Status: DC

## 2021-02-10 MED ORDER — LEVETIRACETAM IN NACL 1000 MG/100ML IV SOLN
1000.0000 mg | Freq: Once | INTRAVENOUS | Status: AC
  Administered 2021-02-10: 1000 mg via INTRAVENOUS
  Filled 2021-02-10: qty 100

## 2021-02-10 MED ORDER — SODIUM CHLORIDE 0.9 % IV BOLUS
1000.0000 mL | Freq: Once | INTRAVENOUS | Status: AC
  Administered 2021-02-10: 1000 mL via INTRAVENOUS

## 2021-02-10 NOTE — ED Triage Notes (Signed)
Pt BIB EMS for seizure. Pt family reports pt had seizure around 0145am lasting for 10-59min. EMS reports that pt was still seizing when fire department arrived. Initially pt was non verbal, tremors, and confused with EMS but started to become more alert and oriented in route. EMS reports no incontinence and no tongue trauma.  Pt had last seizure around 2 months ago? And it is unknown if pt has been taking her seizure meds.  Pt was recently seen here for colitis and finished abx on the 8th. VS  BP 178/100 100% on RA  Pulse 110 20LAC

## 2021-02-10 NOTE — ED Notes (Signed)
Attempted to call family, no answer.  Will continue to try and make contact.

## 2021-02-10 NOTE — ED Provider Notes (Signed)
 [facility] EMERGENCY DEPARTMENT Provider Note  CSN: [id number] Arrival date & time: 02/10/21 0227  Chief Complaint(s) Seizures  HPI Tricia Huynh is a 64 y.o. female with a past medical history listed below including seizures who presents to the emergency departments for seizures at home. Patient reports that she was on AEM, but has not taken it in "a while." She is not sure what medication or who her neurologist is. She is oriented x3.  She denies any recent fevers or infections.  No coughing or congestion.  No nausea or vomiting.  No diarrhea.  No chest pain or shortness of breath.  Denies any physical complaints.  HPI  Past Medical History Past Medical History:  Diagnosis Date  . Diabetes mellitus type 2 in nonobese (HCC) 06/06/2015  . Diabetes mellitus without complication (HCC)   . Diabetic neuropathy (HCC) 06/06/2015  . Dyslipidemia 06/06/2015  . Encephalopathy   . Essential hypertension 06/06/2015  . Hypertension   . Mood disorder (HCC) 06/06/2015  . Pneumonia 03/11/2017  . Seizure (HCC)    sees Yan. abd eeg, on keppra   Patient Active Problem List   Diagnosis Date Noted  . Colitis 12/15/2020  . HCAP (healthcare-associated pneumonia) 03/11/2017  . Dyslipidemia 03/11/2017  . Diabetes mellitus with complication (HCC)   . Chest pain 12/30/2016  . Seizure disorder (HCC) 12/30/2016  . Diabetic ketoacidosis without coma associated with type 2 diabetes mellitus (HCC)   . DKA (diabetic ketoacidoses) 11/15/2016  . UTI (urinary tract infection) 11/15/2016  . Acute cystitis without hematuria   . Acute renal failure (HCC)   . Hyperglycemia 11/14/2016  . Diabetes (HCC) 06/05/2016  . Altered mental status   . CKD (chronic kidney disease) stage 3, GFR 30-59 ml/min (HCC) 05/26/2016  . Benign essential HTN 05/26/2016  . Seizures (HCC) 01/08/2016  . Memory loss 10/04/2015  . Acute encephalopathy 06/06/2015  . Diabetic neuropathy (HCC) 06/06/2015   Home  Medication(s) Prior to Admission medications   Medication Sig Start Date End Date Taking? Authorizing Provider  levETIRAcetam (KEPPRA) 500 MG tablet Take 1 tablet (500 mg total) by mouth 2 (two) times daily. 02/10/21 05/11/21 Yes Cardama, Pedro Eduardo, MD  Accu-Chek Softclix Lancets lancets 1 each by Other route 2 (two) times daily. E11.9 12/20/19   Ellison, Sean, MD  acetaminophen (TYLENOL) 500 MG tablet Take 500 mg by mouth every 6 (six) hours as needed for mild pain or headache.    [provider]  amitriptyline (ELAVIL) 50 MG tablet Take 50 mg by mouth at bedtime. 05/08/15   [provider]  amLODipine-olmesartan (AZOR) 5-40 MG tablet Take 1 tablet by mouth daily. 10/08/16   [provider]  aspirin EC 81 MG tablet Take 81 mg by mouth daily.    [provider]  atorvastatin (LIPITOR) 40 MG tablet Take 40 mg by mouth daily. 12/23/19   [provider]  Blood Glucose Monitoring Suppl (ACCU-CHEK AVIVA PLUS) w/Device KIT 1 each by Does not apply route 2 (two) times daily. E11.9 12/20/19   Ellison, Sean, MD  gabapentin (NEURONTIN) 100 MG capsule Take 100 mg by mouth 3 (three) times daily. 12/23/19   [provider]  glucose blood (ACCU-CHEK AVIVA PLUS) test strip 1 each by Other route 2 (two) times daily. E11.9 12/20/19   Ellison, Sean, MD  Insulin Glargine (LANTUS SOLOSTAR) 100 UNIT/ML Solostar Pen Inject 24 Units into the skin every morning. And pen needles 1/day Patient taking differently: Inject 20 Units into the skin   2 (two) times daily. 12/20/19   Ellison, Sean, MD  ondansetron (ZOFRAN ODT) 4 MG disintegrating tablet Take 1 tablet (4 mg total) by mouth every 8 (eight) hours as needed for nausea or vomiting. 01/17/21   Aberman, Caroline C, PA-C  ondansetron (ZOFRAN) 4 MG tablet Take 1 tablet (4 mg total) by mouth every 8 (eight) hours as needed for nausea or vomiting. 12/19/20   Adhikari, Amrit, MD  sertraline (ZOLOFT) 50 MG tablet Take 50 mg by mouth daily.  01/07/19   [provider]  vitamin E 400 UNIT capsule Take 400 Units by mouth daily.    [provider]                                                                                                                                    Past Surgical History Past Surgical History:  Procedure Laterality Date  . ABDOMINAL HYSTERECTOMY    . EYE SURGERY Bilateral   . TUBAL LIGATION     Family History Family History  Problem Relation Age of Onset  . Heart disease Father   . Kidney disease Father   . Diabetes Mother   . Seizures Sister   . Seizures Brother     Social History Social History   Tobacco Use  . Smoking status: Former Smoker    Types: Cigarettes  . Smokeless tobacco: Never Used  Vaping Use  . Vaping Use: Never used  Substance Use Topics  . Alcohol use: No  . Drug use: No   Allergies Percocet [oxycodone-acetaminophen] and Vicodin [hydrocodone-acetaminophen]  Review of Systems Review of Systems All other systems are reviewed and are negative for acute change except as noted in the HPI  Physical Exam Vital Signs  I have reviewed the triage vital signs BP (!) 168/108   Pulse 92   Temp 98.1 F (36.7 C) (Oral)   Resp 17   Ht 5' 7" (1.702 m)   Wt 58.1 kg   SpO2 99%   BMI 20.05 kg/m   Physical Exam Vitals reviewed.  Constitutional:      General: She is not in acute distress.    Appearance: She is well-developed. She is not diaphoretic.  HENT:     Head: Normocephalic and atraumatic.     Nose: Nose normal.  Eyes:     General: No scleral icterus.       Right eye: No discharge.        Left eye: No discharge.     Conjunctiva/sclera: Conjunctivae normal.     Pupils: Pupils are equal, round, and reactive to light.  Cardiovascular:     Rate and Rhythm: Normal rate and regular rhythm.     Heart sounds: No murmur heard. No friction rub. No gallop.   Pulmonary:     Effort: Pulmonary effort is normal. No respiratory distress.     Breath  sounds: Normal breath sounds. No stridor. No rales.    Abdominal:     General: There is no distension.     Palpations: Abdomen is soft.     Tenderness: There is no abdominal tenderness.  Musculoskeletal:        General: No tenderness.     Cervical back: Normal range of motion and neck supple.  Skin:    General: Skin is warm and dry.     Findings: No erythema or rash.  Neurological:     Mental Status: She is alert and oriented to person, place, and time.     Comments: Mental Status:  Alert and oriented to person, place, and time.  Attention and concentration normal.  Speech clear.  Recent memory is intact  Cranial Nerves:  II Visual Fields: Intact to confrontation. Visual fields intact. III, IV, VI: Pupils equal and reactive to light and near. Full eye movement without nystagmus  V Facial Sensation: Normal. No weakness of masticatory muscles  VII: No facial weakness or asymmetry  VIII Auditory Acuity: Grossly normal  IX/X: The uvula is midline; the palate elevates symmetrically  XI: Normal sternocleidomastoid and trapezius strength  XII: The tongue is midline. No atrophy or fasciculations.   Motor System: Muscle Strength: 5/5 and symmetric in the upper and lower extremities. No pronation or drift.  Muscle Tone: Tone and muscle bulk are normal in the upper and lower extremities.   Reflexes: DTRs: 1+ and symmetrical in all four extremities. No Clonus Coordination:  No tremor.  Sensation: Intact to light touch.  Gait: deferred      ED Results and Treatments Labs (all labs ordered are listed, but only abnormal results are displayed) Labs Reviewed  CBC WITH DIFFERENTIAL/PLATELET - Abnormal; Notable for the following components:      Result Value   RBC 2.82 (*)    Hemoglobin 8.4 (*)    HCT 26.0 (*)    All other components within normal limits  BASIC METABOLIC PANEL - Abnormal; Notable for the following components:   Glucose, Bld 336 (*)    Creatinine, Ser 1.30 (*)     Calcium 8.7 (*)    GFR, Estimated 46 (*)    All other components within normal limits                                                                                                                         EKG  EKG Interpretation  Date/Time:    Ventricular Rate:    PR Interval:    QRS Duration:   QT Interval:    QTC Calculation:   R Axis:     Text Interpretation:        Radiology No results found.  Pertinent labs & imaging results that were available during my care of the patient were reviewed by me and considered in my medical decision making (see chart for details).  Medications Ordered in ED Medications  sodium chloride 0.9 % bolus 1,000 mL (0 mLs Intravenous Stopped 02/10/21 0638)  levETIRAcetam (KEPPRA) IVPB 1000 mg/100  mL premix (0 mg Intravenous Stopped 02/10/21 0638)                                                                                                                                    Procedures Procedures  (including critical care time)  Medical Decision Making / ED Course I have reviewed the nursing notes for this encounter and the patient's prior records (if available in EHR or on provided paperwork).   Tricia Huynh was evaluated in Emergency Department on 02/10/2021 for the symptoms described in the history of present illness. She was evaluated in the context of the global COVID-19 pandemic, which necessitated consideration that the patient might be at risk for infection with the SARS-CoV-2 virus that causes COVID-19. Institutional protocols and algorithms that pertain to the evaluation of patients at risk for COVID-19 are in a state of rapid change based on information released by regulatory bodies including the CDC and federal and state organizations. These policies and algorithms were followed during the patient's care in the ED.  Patient is alert and oriented x4.  No focal deficits noted on exam. Screening labs notable for hyperglycemia  without evidence of DKA. Patient given IV fluids. Loaded with Keppra. Monitor for several hours and remained stable. No repeated seizure.  Appropriate for outpatient management.  Prescribed Keppra 500 twice daily and instructed to follow-up with her neurologist for continued management.       Final Clinical Impression(s) / ED Diagnoses Final diagnoses:  Seizure (HCC)   The patient appears reasonably screened and/or stabilized for discharge and I doubt any other medical condition or other EMC requiring further screening, evaluation, or treatment in the ED at this time prior to discharge. Safe for discharge with strict return precautions.  Disposition: Discharge  Condition: Good  I have discussed the results, Dx and Tx plan with the patient/family who expressed understanding and agree(s) with the plan. Discharge instructions discussed at length. The patient/family was given strict return precautions who verbalized understanding of the instructions. No further questions at time of discharge.    ED Discharge Orders         Ordered    levETIRAcetam (KEPPRA) 500 MG tablet  2 times daily        02/10/21 0718            Follow Up: Wolters, Sharon, MD [address] [place] [address] [phone]  Call  to schedule an appointment for close follow up  Yan, Yijun, MD [address] [place] [address] [phone]  Call  to schedule an appointment for close follow up for continued seizure management     This chart was dictated using voice recognition software.  Despite best efforts to proofread,  errors can occur which can change the documentation meaning.   Cardama, Pedro Eduardo, MD 02/10/21 0812  

## 2021-03-25 DIAGNOSIS — Z794 Long term (current) use of insulin: Secondary | ICD-10-CM | POA: Diagnosis not present

## 2021-03-25 DIAGNOSIS — K644 Residual hemorrhoidal skin tags: Secondary | ICD-10-CM | POA: Diagnosis not present

## 2021-03-25 DIAGNOSIS — R110 Nausea: Secondary | ICD-10-CM | POA: Diagnosis not present

## 2021-03-25 DIAGNOSIS — Z1211 Encounter for screening for malignant neoplasm of colon: Secondary | ICD-10-CM | POA: Diagnosis not present

## 2021-03-25 DIAGNOSIS — I10 Essential (primary) hypertension: Secondary | ICD-10-CM | POA: Diagnosis not present

## 2021-03-25 DIAGNOSIS — E1165 Type 2 diabetes mellitus with hyperglycemia: Secondary | ICD-10-CM | POA: Diagnosis not present

## 2021-03-25 DIAGNOSIS — R109 Unspecified abdominal pain: Secondary | ICD-10-CM | POA: Diagnosis not present

## 2021-03-25 DIAGNOSIS — R569 Unspecified convulsions: Secondary | ICD-10-CM | POA: Diagnosis not present

## 2021-04-08 ENCOUNTER — Emergency Department (HOSPITAL_COMMUNITY): Payer: Medicare HMO

## 2021-04-08 ENCOUNTER — Emergency Department (HOSPITAL_COMMUNITY)
Admission: EM | Admit: 2021-04-08 | Discharge: 2021-04-08 | Disposition: A | Discharge status: Home / Self Care | Payer: Medicare HMO | Attending: Emergency Medicine | Admitting: Emergency Medicine

## 2021-04-08 ENCOUNTER — Other Ambulatory Visit: Payer: Self-pay

## 2021-04-08 ENCOUNTER — Encounter (HOSPITAL_COMMUNITY): Payer: Self-pay | Admitting: Emergency Medicine

## 2021-04-08 DIAGNOSIS — E1122 Type 2 diabetes mellitus with diabetic chronic kidney disease: Secondary | ICD-10-CM | POA: Diagnosis not present

## 2021-04-08 DIAGNOSIS — Z7982 Long term (current) use of aspirin: Secondary | ICD-10-CM | POA: Insufficient documentation

## 2021-04-08 DIAGNOSIS — R569 Unspecified convulsions: Secondary | ICD-10-CM

## 2021-04-08 DIAGNOSIS — N183 Chronic kidney disease, stage 3 unspecified: Secondary | ICD-10-CM | POA: Diagnosis not present

## 2021-04-08 DIAGNOSIS — E1140 Type 2 diabetes mellitus with diabetic neuropathy, unspecified: Secondary | ICD-10-CM | POA: Diagnosis not present

## 2021-04-08 DIAGNOSIS — I10 Essential (primary) hypertension: Secondary | ICD-10-CM | POA: Diagnosis not present

## 2021-04-08 DIAGNOSIS — Z794 Long term (current) use of insulin: Secondary | ICD-10-CM | POA: Diagnosis not present

## 2021-04-08 DIAGNOSIS — R55 Syncope and collapse: Secondary | ICD-10-CM | POA: Diagnosis not present

## 2021-04-08 DIAGNOSIS — Z87891 Personal history of nicotine dependence: Secondary | ICD-10-CM | POA: Insufficient documentation

## 2021-04-08 DIAGNOSIS — I129 Hypertensive chronic kidney disease with stage 1 through stage 4 chronic kidney disease, or unspecified chronic kidney disease: Secondary | ICD-10-CM | POA: Insufficient documentation

## 2021-04-08 DIAGNOSIS — G40909 Epilepsy, unspecified, not intractable, without status epilepticus: Secondary | ICD-10-CM | POA: Diagnosis not present

## 2021-04-08 DIAGNOSIS — Z79899 Other long term (current) drug therapy: Secondary | ICD-10-CM | POA: Insufficient documentation

## 2021-04-08 LAB — CBC WITH DIFFERENTIAL/PLATELET
Abs Immature Granulocytes: 0.05 10*3/uL (ref 0.00–0.07)
Basophils Absolute: 0 10*3/uL (ref 0.0–0.1)
Basophils Relative: 0 %
Eosinophils Absolute: 0 10*3/uL (ref 0.0–0.5)
Eosinophils Relative: 0 %
HCT: 30.2 % — ABNORMAL LOW (ref 36.0–46.0)
Hemoglobin: 10 g/dL — ABNORMAL LOW (ref 12.0–15.0)
Immature Granulocytes: 1 %
Lymphocytes Relative: 22 %
Lymphs Abs: 1.1 10*3/uL (ref 0.7–4.0)
MCH: 30.4 pg (ref 26.0–34.0)
MCHC: 33.1 g/dL (ref 30.0–36.0)
MCV: 91.8 fL (ref 80.0–100.0)
Monocytes Absolute: 0.2 10*3/uL (ref 0.1–1.0)
Monocytes Relative: 5 %
Neutro Abs: 3.5 10*3/uL (ref 1.7–7.7)
Neutrophils Relative %: 72 %
Platelets: 212 10*3/uL (ref 150–400)
RBC: 3.29 MIL/uL — ABNORMAL LOW (ref 3.87–5.11)
RDW: 12.3 % (ref 11.5–15.5)
WBC: 4.8 10*3/uL (ref 4.0–10.5)
nRBC: 0 % (ref 0.0–0.2)

## 2021-04-08 LAB — CBG MONITORING, ED: Glucose-Capillary: 252 mg/dL — ABNORMAL HIGH (ref 70–99)

## 2021-04-08 LAB — COMPREHENSIVE METABOLIC PANEL
ALT: 14 U/L (ref 0–44)
AST: 20 U/L (ref 15–41)
Albumin: 3.6 g/dL (ref 3.5–5.0)
Alkaline Phosphatase: 131 U/L — ABNORMAL HIGH (ref 38–126)
Anion gap: 6 (ref 5–15)
BUN: 15 mg/dL (ref 8–23)
CO2: 24 mmol/L (ref 22–32)
Calcium: 9.1 mg/dL (ref 8.9–10.3)
Chloride: 107 mmol/L (ref 98–111)
Creatinine, Ser: 1.23 mg/dL — ABNORMAL HIGH (ref 0.44–1.00)
GFR, Estimated: 49 mL/min — ABNORMAL LOW (ref >60)
Glucose, Bld: 238 mg/dL — ABNORMAL HIGH (ref 70–99)
Potassium: 4 mmol/L (ref 3.5–5.1)
Sodium: 137 mmol/L (ref 135–145)
Total Bilirubin: 0.7 mg/dL (ref 0.3–1.2)
Total Protein: 7.6 g/dL (ref 6.5–8.1)

## 2021-04-08 MED ORDER — LEVETIRACETAM IN NACL 1000 MG/100ML IV SOLN
1000.0000 mg | Freq: Once | INTRAVENOUS | Status: AC
  Administered 2021-04-08: 1000 mg via INTRAVENOUS
  Filled 2021-04-08: qty 100

## 2021-04-08 MED ORDER — SODIUM CHLORIDE 0.9 % IV BOLUS
500.0000 mL | Freq: Once | INTRAVENOUS | Status: AC
  Administered 2021-04-08: 500 mL via INTRAVENOUS

## 2021-04-08 NOTE — Discharge Instructions (Addendum)
Please follow-up with your neurologist.  Continue to take your medications.

## 2021-04-08 NOTE — ED Triage Notes (Signed)
Pt BIBA with c/o seizures. Per EMS, pt woke up and went to the bathroom. Family describes a near syncapal episode and "shaking". Seizure lasted about 7 minutes. Post ictal state when EMS arrived, and pt started seizing again in the EMS truck. 5 mg Versed given. Pt does have a hx of seizures and has just been put back on seizure medication.   18 G left AC BP 157/100 HR-80 97 room air 201 CBG

## 2021-04-08 NOTE — ED Provider Notes (Signed)
[facility] DEPT Provider Note   CSN: [id number] Arrival date & time: 04/08/21  1102     History Chief Complaint  Patient presents with  . Seizures    Tricia Huynh is a 65 y.o. female.  The history is provided by a caregiver and the EMS personnel.  Seizures Seizure activity on arrival: no   Seizure type:  Unable to specify Initial focality:  Diffuse Episode characteristics: abnormal movements   Postictal symptoms: confusion   Return to baseline: no   Severity:  Mild Timing: two. Context: not change in medication   Recent head injury:  No recent head injuries PTA treatment:  Midazolam History of seizures: yes        Past Medical History:  Diagnosis Date  . Diabetes mellitus type 2 in nonobese ([place]) 06/06/2015  . Diabetes mellitus without complication ([place])   . Diabetic neuropathy ([place]) 06/06/2015  . Dyslipidemia 06/06/2015  . Encephalopathy   . Essential hypertension 06/06/2015  . Hypertension   . Mood disorder ([place]) 06/06/2015  . Pneumonia 03/11/2017  . Seizure [facility])    sees [name]. abd eeg, on keppra    Patient Active Problem List   Diagnosis Date Noted  . Colitis 12/15/2020  . HCAP (healthcare-associated pneumonia) 03/11/2017  . Dyslipidemia 03/11/2017  . Diabetes mellitus with complication ([place])   . Chest pain 12/30/2016  . Seizure disorder ([place]) 12/30/2016  . Diabetic ketoacidosis without coma associated with type 2 diabetes mellitus ([place])   . DKA (diabetic ketoacidoses) 11/15/2016  . UTI (urinary tract infection) 11/15/2016  . Acute cystitis without hematuria   . Acute renal failure ([place])   . Hyperglycemia 11/14/2016  . Diabetes ([place]) 06/05/2016  . Altered mental status   . CKD (chronic kidney disease) stage 3, GFR 30-59 ml/min (HCC) 05/26/2016  . Benign essential HTN 05/26/2016  . Seizures ([place]) 01/08/2016  . Memory loss 10/04/2015  . Acute encephalopathy 06/06/2015  . Diabetic neuropathy ([place]) 06/06/2015     Past Surgical History:  Procedure Laterality Date  . ABDOMINAL HYSTERECTOMY    . EYE SURGERY Bilateral   . TUBAL LIGATION       OB History   No obstetric history on file.     Family History  Problem Relation Age of Onset  . Heart disease Father   . Kidney disease Father   . Diabetes Mother   . Seizures Sister   . Seizures Brother     Social History   Tobacco Use  . Smoking status: Former Smoker    Types: Cigarettes  . Smokeless tobacco: Never Used  Vaping Use  . Vaping Use: Never used  Substance Use Topics  . Alcohol use: No  . Drug use: No    Home Medications Prior to Admission medications   Medication Sig Start Date End Date Taking? Authorizing Provider  Accu-Chek Softclix Lancets lancets 1 each by Other route 2 (two) times daily. E11.9 12/20/19   Renato Shin, MD  acetaminophen (TYLENOL) 500 MG tablet Take 500 mg by mouth every 6 (six) hours as needed for mild pain or headache.    [provider]  amitriptyline (ELAVIL) 50 MG tablet Take 50 mg by mouth at bedtime. 05/08/15   [provider]  amLODipine-olmesartan (AZOR) 5-40 MG tablet Take 1 tablet by mouth daily. 10/08/16   [provider]  aspirin EC 81 MG tablet Take 81 mg by mouth daily.    [provider]  atorvastatin (LIPITOR) 40 MG tablet Take 40 mg  by mouth daily. 12/23/19   [provider]  Blood Glucose Monitoring Suppl (ACCU-CHEK AVIVA PLUS) w/Device KIT 1 each by Does not apply route 2 (two) times daily. E11.9 12/20/19   Renato Shin, MD  gabapentin (NEURONTIN) 100 MG capsule Take 100 mg by mouth 3 (three) times daily. 12/23/19   [provider]  glucose blood (ACCU-CHEK AVIVA PLUS) test strip 1 each by Other route 2 (two) times daily. E11.9 12/20/19   Renato Shin, MD  Insulin Glargine (LANTUS SOLOSTAR) 100 UNIT/ML Solostar Pen Inject 24 Units into the skin every morning. And pen needles 1/day Patient taking differently: Inject 20 Units into the skin 2  (two) times daily. 12/20/19   Renato Shin, MD  levETIRAcetam (KEPPRA) 500 MG tablet Take 1 tablet (500 mg total) by mouth 2 (two) times daily. 02/10/21 05/11/21  Fatima Blank, MD  ondansetron (ZOFRAN ODT) 4 MG disintegrating tablet Take 1 tablet (4 mg total) by mouth every 8 (eight) hours as needed for nausea or vomiting. 01/17/21   Suzy Bouchard, PA-C  ondansetron (ZOFRAN) 4 MG tablet Take 1 tablet (4 mg total) by mouth every 8 (eight) hours as needed for nausea or vomiting. 12/19/20   Shelly Coss, MD  sertraline (ZOLOFT) 50 MG tablet Take 50 mg by mouth daily. 01/07/19   [provider]  vitamin E 400 UNIT capsule Take 400 Units by mouth daily.    [provider]    Allergies    Percocet [oxycodone-acetaminophen] and Vicodin [hydrocodone-acetaminophen]  Review of Systems   Review of Systems  Constitutional: Negative for chills and fever.  HENT: Negative for ear pain and sore throat.   Eyes: Negative for pain and visual disturbance.  Respiratory: Negative for cough and shortness of breath.   Cardiovascular: Negative for chest pain and palpitations.  Gastrointestinal: Negative for abdominal pain and vomiting.  Genitourinary: Negative for dysuria and hematuria.  Musculoskeletal: Negative for arthralgias and back pain.  Skin: Negative for color change and rash.  Neurological: Positive for seizures. Negative for syncope.  All other systems reviewed and are negative.   Physical Exam Updated Vital Signs  ED Triage Vitals  Enc Vitals Group     BP 04/08/21 1118 (!) 196/101     Pulse Rate 04/08/21 1118 78     Resp 04/08/21 1118 18     Temp 04/08/21 1118 98.4 F (36.9 C)     Temp Source 04/08/21 1118 Oral     SpO2 04/08/21 1112 97 %     Weight 04/08/21 1119 128 lb 1.4 oz (58.1 kg)     Height 04/08/21 1119 5' 7" (1.702 m)     Head Circumference --      Peak Flow --      Pain Score 04/08/21 1119 0     Pain Loc --      Pain Edu? --      Excl. in [place]? --      Physical Exam Vitals and nursing note reviewed.  Constitutional:      General: She is not in acute distress.    Appearance: She is well-developed. She is not ill-appearing.  HENT:     Head: Normocephalic and atraumatic.     Nose: Nose normal.     Mouth/Throat:     Mouth: Mucous membranes are moist.  Eyes:     Extraocular Movements: Extraocular movements intact.     Conjunctiva/sclera: Conjunctivae normal.     Pupils: Pupils are equal, round, and reactive to light.  Cardiovascular:     Rate and Rhythm: Normal rate and regular rhythm.     Pulses: Normal pulses.     Heart sounds: Normal heart sounds. No murmur heard.   Pulmonary:     Effort: Pulmonary effort is normal. No respiratory distress.     Breath sounds: Normal breath sounds.  Abdominal:     Palpations: Abdomen is soft.     Tenderness: There is no abdominal tenderness.  Musculoskeletal:        General: Normal range of motion.     Cervical back: Normal range of motion and neck supple.  Skin:    General: Skin is warm and dry.     Capillary Refill: Capillary refill takes less than 2 seconds.  Neurological:     General: No focal deficit present.     Mental Status: She is alert.     Comments: Moves all extremities, somnolent but awake, not speaking but following commands     ED Results / Procedures / Treatments   Labs (all labs ordered are listed, but only abnormal results are displayed) Labs Reviewed  CBC WITH DIFFERENTIAL/PLATELET - Abnormal; Notable for the following components:      Result Value   RBC 3.29 (*)    Hemoglobin 10.0 (*)    HCT 30.2 (*)    All other components within normal limits  COMPREHENSIVE METABOLIC PANEL - Abnormal; Notable for the following components:   Glucose, Bld 238 (*)    Creatinine, Ser 1.23 (*)    Alkaline Phosphatase 131 (*)    GFR, Estimated 49 (*)    All other components within normal limits  CBG MONITORING, ED - Abnormal; Notable for the following components:    Glucose-Capillary 252 (*)    All other components within normal limits    EKG EKG Interpretation  Date/Time:  Monday Apr 08 2021 11:20:27 EDT Ventricular Rate:  77 PR Interval:  166 QRS Duration: 90 QT Interval:  396 QTC Calculation: 449 R Axis:   11 Text Interpretation: Sinus rhythm Low voltage, extremity leads Confirmed by Lennice Sites ([id number]) on 04/08/2021 11:31:01 AM   Radiology CT Head Wo Contrast  Result Date: 04/08/2021 CLINICAL DATA:  Syncope and possible seizure. EXAM: CT HEAD WITHOUT CONTRAST TECHNIQUE: Contiguous axial images were obtained from the base of the skull through the vertex without intravenous contrast. COMPARISON:  Head CT 01/22/2018. FINDINGS: Brain: No evidence of acute infarction, hemorrhage, hydrocephalus, extra-axial collection or mass lesion/mass effect. Vascular: No hyperdense vessel or unexpected calcification. Skull: Intact.  No focal lesion. Sinuses/Orbits: Mild mucosal is chronic thickening and ethmoid air cells. Status post cataract surgery. Other: Small cystic lesion in the scalp over the left parietal bone is unchanged and likely represents a sebaceous cyst. IMPRESSION: No acute abnormality. Chronic, mild ethmoid air cell disease is unchanged. Electronically Signed   By: Inge Rise M.D.   On: 04/08/2021 12:51   DG Chest Portable 1 View  Result Date: 04/08/2021 CLINICAL DATA:  Seizure. EXAM: PORTABLE CHEST 1 VIEW COMPARISON:  02/17/2020 FINDINGS: The heart size and mediastinal contours are within normal limits. Both lungs are clear. The visualized skeletal structures are unremarkable. IMPRESSION: No active disease. Electronically Signed   By: Kerby Moors M.D.   On: 04/08/2021 12:51    Procedures Procedures   Medications Ordered in ED Medications  levETIRAcetam (KEPPRA) IVPB 1000 mg/100 mL premix (0 mg Intravenous Stopped 04/08/21 1206)  sodium chloride 0.9 % bolus 500 mL (500 mLs Intravenous Bolus 04/08/21 1152)  ED Course  I have  reviewed the triage vital signs and the nursing notes.  Pertinent labs & imaging results that were available during my care of the patient were reviewed by me and considered in my medical decision making (see chart for details).    MDM Rules/Calculators/A&P                          Lamya Lausch is here with seizure.  History of diabetes and seizure.  Recently restarted back on Keppra several months ago.  Patient had seizure at home.  Was given Versed by EMS after possible second seizure.  She arrives here neurologically intact.  Postictal.  Follows commands and moves all extremities.  Denies any pain.  Family states that she does have prolonged postictal periods.  They did not think she hit her head or lost consciousness.  Blood sugar 238 but patient not in DKA.  No leukocytosis or significant anemia.  Head CT is normal.  Chest x-ray is normal.  Patient was loaded with Keppra.  Overall patient was observed in the ED for several hours and she had returned to her baseline.  She says she is really tired but she is neurologically intact.  No other seizure-like event while in the ED.  Overall she appears stable for discharge at this time.  Recommend close follow-up with her neurologist.  Told to return if she continues to have seizures.  This chart was dictated using voice recognition software.  Despite best efforts to proofread,  errors can occur which can change the documentation meaning.    Final Clinical Impression(s) / ED Diagnoses Final diagnoses:  Seizure [facility])    Rx / [place] Orders ED Discharge Orders    None       Lennice Sites, DO 04/08/21 1507

## 2021-04-10 ENCOUNTER — Emergency Department (HOSPITAL_COMMUNITY)
Admission: EM | Admit: 2021-04-10 | Discharge: 2021-04-10 | Disposition: A | Discharge status: Home / Self Care | Payer: Medicare HMO | Attending: Emergency Medicine | Admitting: Emergency Medicine

## 2021-04-10 ENCOUNTER — Other Ambulatory Visit: Payer: Self-pay

## 2021-04-10 ENCOUNTER — Encounter (HOSPITAL_COMMUNITY): Payer: Self-pay

## 2021-04-10 DIAGNOSIS — Z87891 Personal history of nicotine dependence: Secondary | ICD-10-CM | POA: Insufficient documentation

## 2021-04-10 DIAGNOSIS — E119 Type 2 diabetes mellitus without complications: Secondary | ICD-10-CM | POA: Insufficient documentation

## 2021-04-10 DIAGNOSIS — Z794 Long term (current) use of insulin: Secondary | ICD-10-CM | POA: Insufficient documentation

## 2021-04-10 DIAGNOSIS — Z79899 Other long term (current) drug therapy: Secondary | ICD-10-CM | POA: Diagnosis not present

## 2021-04-10 DIAGNOSIS — R42 Dizziness and giddiness: Secondary | ICD-10-CM | POA: Diagnosis not present

## 2021-04-10 DIAGNOSIS — Z7982 Long term (current) use of aspirin: Secondary | ICD-10-CM | POA: Insufficient documentation

## 2021-04-10 DIAGNOSIS — I10 Essential (primary) hypertension: Secondary | ICD-10-CM | POA: Diagnosis not present

## 2021-04-10 DIAGNOSIS — E1165 Type 2 diabetes mellitus with hyperglycemia: Secondary | ICD-10-CM | POA: Diagnosis not present

## 2021-04-10 LAB — CBC WITH DIFFERENTIAL/PLATELET
Abs Immature Granulocytes: 0.01 10*3/uL (ref 0.00–0.07)
Basophils Absolute: 0 10*3/uL (ref 0.0–0.1)
Basophils Relative: 0 %
Eosinophils Absolute: 0 10*3/uL (ref 0.0–0.5)
Eosinophils Relative: 0 %
HCT: 32.1 % — ABNORMAL LOW (ref 36.0–46.0)
Hemoglobin: 10.7 g/dL — ABNORMAL LOW (ref 12.0–15.0)
Immature Granulocytes: 0 %
Lymphocytes Relative: 29 %
Lymphs Abs: 1.6 10*3/uL (ref 0.7–4.0)
MCH: 30.4 pg (ref 26.0–34.0)
MCHC: 33.3 g/dL (ref 30.0–36.0)
MCV: 91.2 fL (ref 80.0–100.0)
Monocytes Absolute: 0.2 10*3/uL (ref 0.1–1.0)
Monocytes Relative: 4 %
Neutro Abs: 3.7 10*3/uL (ref 1.7–7.7)
Neutrophils Relative %: 67 %
Platelets: 238 10*3/uL (ref 150–400)
RBC: 3.52 MIL/uL — ABNORMAL LOW (ref 3.87–5.11)
RDW: 12.2 % (ref 11.5–15.5)
WBC: 5.6 10*3/uL (ref 4.0–10.5)
nRBC: 0 % (ref 0.0–0.2)

## 2021-04-10 LAB — COMPREHENSIVE METABOLIC PANEL
ALT: 13 U/L (ref 0–44)
AST: 22 U/L (ref 15–41)
Albumin: 3.8 g/dL (ref 3.5–5.0)
Alkaline Phosphatase: 139 U/L — ABNORMAL HIGH (ref 38–126)
Anion gap: 15 (ref 5–15)
BUN: 21 mg/dL (ref 8–23)
CO2: 19 mmol/L — ABNORMAL LOW (ref 22–32)
Calcium: 9.4 mg/dL (ref 8.9–10.3)
Chloride: 105 mmol/L (ref 98–111)
Creatinine, Ser: 1.27 mg/dL — ABNORMAL HIGH (ref 0.44–1.00)
GFR, Estimated: 47 mL/min — ABNORMAL LOW (ref >60)
Glucose, Bld: 133 mg/dL — ABNORMAL HIGH (ref 70–99)
Potassium: 3.7 mmol/L (ref 3.5–5.1)
Sodium: 139 mmol/L (ref 135–145)
Total Bilirubin: 0.7 mg/dL (ref 0.3–1.2)
Total Protein: 8 g/dL (ref 6.5–8.1)

## 2021-04-10 MED ORDER — SODIUM CHLORIDE 0.9 % IV BOLUS: 500.0000 mL | Freq: Once | INTRAVENOUS | Status: DC

## 2021-04-10 NOTE — ED Triage Notes (Signed)
Pt reports being sent here by PCP because of a hemoglobin A1C of 10. Pt also reports hypertension despite taking her BP medication today. Pt endorses mild dizziness at this time.

## 2021-04-10 NOTE — Discharge Instructions (Signed)
Return for any problem.  ?

## 2021-04-10 NOTE — ED Provider Notes (Signed)
[facility] DEPT Provider Note   CSN: [id number] Arrival date & time: 04/10/21  4098     History Chief Complaint  Patient presents with  . Hypertension  . Abnormal Lab    Tricia Huynh is a 65 y.o. female.  65 year old female with prior medical history as detailed below presents for evaluation.  Patient with recent visit to [facility] facility 2 days prior after seizure activity.  Patient was treated for seizure.  Work-up at that time was without significant abormality require admission.  Patient was discharged with Keppra for antiepileptic.   Patient apparently contacted her PCP who advised her to come to the ED today for elevated hemoglobin A1c.  Patient complains of feeling dizzy.  Patient denies seizure activity since her last visit.  She denies fever.  She denies focal weakness, speech change, blurry vision, or other acute complaint.  The history is provided by the patient and medical records.  Illness Location:  Elevated HGB A1c, dizzy Severity:  Mild Onset quality:  Unable to specify Timing:  Unable to specify Progression:  Unable to specify Chronicity:  New      Past Medical History:  Diagnosis Date  . Diabetes mellitus type 2 in nonobese ([place]) 06/06/2015  . Diabetes mellitus without complication ([place])   . Diabetic neuropathy ([place]) 06/06/2015  . Dyslipidemia 06/06/2015  . Encephalopathy   . Essential hypertension 06/06/2015  . Hypertension   . Mood disorder ([place]) 06/06/2015  . Pneumonia 03/11/2017  . Seizure [facility]    sees [name]. abd eeg, on keppra    Patient Active Problem List   Diagnosis Date Noted  . Colitis 12/15/2020  . HCAP (healthcare-associated pneumonia) 03/11/2017  . Dyslipidemia 03/11/2017  . Diabetes mellitus with complication ([place])   . Chest pain 12/30/2016  . Seizure disorder ([place]) 12/30/2016  . Diabetic ketoacidosis without coma associated with type 2 diabetes mellitus ([place])   . DKA (diabetic  ketoacidoses) 11/15/2016  . UTI (urinary tract infection) 11/15/2016  . Acute cystitis without hematuria   . Acute renal failure ([place])   . Hyperglycemia 11/14/2016  . Diabetes ([place]) 06/05/2016  . Altered mental status   . CKD (chronic kidney disease) stage 3, GFR 30-59 ml/min (HCC) 05/26/2016  . Benign essential HTN 05/26/2016  . Seizures ([place]) 01/08/2016  . Memory loss 10/04/2015  . Acute encephalopathy 06/06/2015  . Diabetic neuropathy ([place]) 06/06/2015    Past Surgical History:  Procedure Laterality Date  . ABDOMINAL HYSTERECTOMY    . EYE SURGERY Bilateral   . TUBAL LIGATION       OB History   No obstetric history on file.     Family History  Problem Relation Age of Onset  . Heart disease Father   . Kidney disease Father   . Diabetes Mother   . Seizures Sister   . Seizures Brother     Social History   Tobacco Use  . Smoking status: Former Smoker    Types: Cigarettes  . Smokeless tobacco: Never Used  Vaping Use  . Vaping Use: Never used  Substance Use Topics  . Alcohol use: No  . Drug use: No    Home Medications Prior to Admission medications   Medication Sig Start Date End Date Taking? Authorizing Provider  Accu-Chek Softclix Lancets lancets 1 each by Other route 2 (two) times daily. E11.9 12/20/19   Renato Shin, MD  acetaminophen (TYLENOL) 500 MG tablet Take 500 mg by mouth every 6 (six) hours as needed for mild pain  or headache.    [provider]  amitriptyline (ELAVIL) 50 MG tablet Take 50 mg by mouth at bedtime. 05/08/15   [provider]  amLODipine-olmesartan (AZOR) 5-40 MG tablet Take 1 tablet by mouth daily. 10/08/16   [provider]  aspirin EC 81 MG tablet Take 81 mg by mouth daily.    [provider]  atorvastatin (LIPITOR) 40 MG tablet Take 40 mg by mouth daily. 12/23/19   [provider]  Blood Glucose Monitoring Suppl (ACCU-CHEK AVIVA PLUS) w/Device KIT 1 each by Does not apply route 2 (two) times  daily. E11.9 12/20/19   Renato Shin, MD  gabapentin (NEURONTIN) 100 MG capsule Take 100 mg by mouth 3 (three) times daily. 12/23/19   [provider]  glucose blood (ACCU-CHEK AVIVA PLUS) test strip 1 each by Other route 2 (two) times daily. E11.9 12/20/19   Renato Shin, MD  Insulin Glargine (LANTUS SOLOSTAR) 100 UNIT/ML Solostar Pen Inject 24 Units into the skin every morning. And pen needles 1/day Patient taking differently: Inject 20 Units into the skin 2 (two) times daily. 12/20/19   Renato Shin, MD  levETIRAcetam (KEPPRA) 500 MG tablet Take 1 tablet (500 mg total) by mouth 2 (two) times daily. 02/10/21 05/11/21  Fatima Blank, MD  ondansetron (ZOFRAN ODT) 4 MG disintegrating tablet Take 1 tablet (4 mg total) by mouth every 8 (eight) hours as needed for nausea or vomiting. 01/17/21   Suzy Bouchard, PA-C  ondansetron (ZOFRAN) 4 MG tablet Take 1 tablet (4 mg total) by mouth every 8 (eight) hours as needed for nausea or vomiting. 12/19/20   Shelly Coss, MD  sertraline (ZOLOFT) 50 MG tablet Take 50 mg by mouth daily. 01/07/19   [provider]  vitamin E 400 UNIT capsule Take 400 Units by mouth daily.    [provider]    Allergies    Percocet [oxycodone-acetaminophen] and Vicodin [hydrocodone-acetaminophen]  Review of Systems   Review of Systems  All other systems reviewed and are negative.   Physical Exam Updated Vital Signs BP (!) 197/112   Pulse 85   Temp 99 F (37.2 C) (Oral)   Resp 18   SpO2 99%   Physical Exam Vitals and nursing note reviewed.  Constitutional:      General: She is not in acute distress.    Appearance: Normal appearance. She is well-developed.  HENT:     Head: Normocephalic and atraumatic.  Eyes:     Conjunctiva/sclera: Conjunctivae normal.     Pupils: Pupils are equal, round, and reactive to light.  Cardiovascular:     Rate and Rhythm: Normal rate and regular rhythm.     Heart sounds: Normal heart sounds.   Pulmonary:     Effort: Pulmonary effort is normal. No respiratory distress.     Breath sounds: Normal breath sounds.  Abdominal:     General: There is no distension.     Palpations: Abdomen is soft.     Tenderness: There is no abdominal tenderness.  Musculoskeletal:        General: No deformity. Normal range of motion.     Cervical back: Normal range of motion and neck supple.  Skin:    General: Skin is warm and dry.  Neurological:     General: No focal deficit present.     Mental Status: She is alert and oriented to person, place, and time.     Cranial Nerves: No cranial nerve deficit.     Sensory:  No sensory deficit.     Motor: No weakness.     Coordination: Coordination normal.     ED Results / Procedures / Treatments   Labs (all labs ordered are listed, but only abnormal results are displayed) Labs Reviewed  CBC WITH DIFFERENTIAL/PLATELET - Abnormal; Notable for the following components:      Result Value   RBC 3.52 (*)    Hemoglobin 10.7 (*)    HCT 32.1 (*)    All other components within normal limits  COMPREHENSIVE METABOLIC PANEL  CBG MONITORING, ED    EKG EKG Interpretation  Date/Time:  Wednesday Apr 10 2021 17:40:08 EDT Ventricular Rate:  80 PR Interval:  179 QRS Duration: 84 QT Interval:  400 QTC Calculation: 462 R Axis:   13 Text Interpretation: Sinus rhythm Abnormal R-wave progression, early transition 12 Lead; [place] Confirmed by Dene Gentry [phone]) on 04/10/2021 6:21:34 PM   Radiology No results found.  Procedures Procedures   Medications Ordered in ED Medications  sodium chloride 0.9 % bolus 500 mL (has no administration in time range)    ED Course  I have reviewed the triage vital signs and the nursing notes.  Pertinent labs & imaging results that were available during my care of the patient were reviewed by me and considered in my medical decision making (see chart for details).    MDM Rules/Calculators/A&P                           MDM  MSE complete  Tricia Huynh was evaluated in Emergency Department on 04/10/2021 for the symptoms described in the history of present illness. She was evaluated in the context of the global COVID-19 pandemic, which necessitated consideration that the patient might be at risk for infection with the SARS-CoV-2 virus that causes COVID-19. Institutional protocols and algorithms that pertain to the evaluation of patients at risk for COVID-19 are in a state of rapid change based on information released by regulatory bodies including the CDC and federal and state organizations. These policies and algorithms were followed during the patient's care in the ED.  Patient appears to have come to the ED after being told that she has an elevated hemoglobin A1c.  She is otherwise without very specific complaint.  Screening labs obtained are without significant abnormality.  Of note patient's blood glucose today is 133.  She has no evidence of DKA.  Patient's blood pressure is elevated.  She is not symptomatic from this elevated blood pressure.  Patient is reassured after ED evaluation.  She does understand need for close follow-up with her regular care providers.  Strict return precautions given and understood.   Final Clinical Impression(s) / ED Diagnoses Final diagnoses:  Hypertension, unspecified type  Type 2 diabetes mellitus without complication, unspecified whether long term insulin use ([place])    Rx / DC Orders ED Discharge Orders    None       Valarie Merino, MD 04/10/21 1935

## 2021-04-10 NOTE — ED Provider Notes (Signed)
MSE note.  Patient complains of dizziness but not vertigo symptoms.  Her daughter called her doctor and he told her to come here.  Supposedly the patient's hemoglobin A1c has been 10 and her blood pressures been elevated.  Patient has stable labs and fluids ordered   Milton Ferguson, MD 04/10/21 1710

## 2021-05-02 DIAGNOSIS — D649 Anemia, unspecified: Secondary | ICD-10-CM | POA: Diagnosis not present

## 2021-05-06 ENCOUNTER — Ambulatory Visit
Admission: RE | Admit: 2021-05-06 | Discharge: 2021-05-06 | Disposition: A | Discharge status: Home / Self Care | Payer: Medicare HMO | Source: Ambulatory Visit | Attending: Physician Assistant | Admitting: Physician Assistant

## 2021-05-06 ENCOUNTER — Other Ambulatory Visit: Payer: Self-pay | Admitting: Physician Assistant

## 2021-05-06 DIAGNOSIS — K5904 Chronic idiopathic constipation: Secondary | ICD-10-CM

## 2021-05-06 DIAGNOSIS — K5900 Constipation, unspecified: Secondary | ICD-10-CM | POA: Diagnosis not present

## 2021-05-12 ENCOUNTER — Emergency Department (HOSPITAL_COMMUNITY)
Admission: EM | Admit: 2021-05-12 | Discharge: 2021-05-12 | Disposition: A | Discharge status: Home / Self Care | Payer: Medicare HMO | Attending: Emergency Medicine | Admitting: Emergency Medicine

## 2021-05-12 ENCOUNTER — Other Ambulatory Visit: Payer: Self-pay

## 2021-05-12 DIAGNOSIS — R000 Tachycardia, unspecified: Secondary | ICD-10-CM | POA: Diagnosis not present

## 2021-05-12 DIAGNOSIS — I129 Hypertensive chronic kidney disease with stage 1 through stage 4 chronic kidney disease, or unspecified chronic kidney disease: Secondary | ICD-10-CM | POA: Diagnosis not present

## 2021-05-12 DIAGNOSIS — E1169 Type 2 diabetes mellitus with other specified complication: Secondary | ICD-10-CM | POA: Diagnosis not present

## 2021-05-12 DIAGNOSIS — I10 Essential (primary) hypertension: Secondary | ICD-10-CM | POA: Diagnosis not present

## 2021-05-12 DIAGNOSIS — E785 Hyperlipidemia, unspecified: Secondary | ICD-10-CM | POA: Diagnosis not present

## 2021-05-12 DIAGNOSIS — Z794 Long term (current) use of insulin: Secondary | ICD-10-CM | POA: Insufficient documentation

## 2021-05-12 DIAGNOSIS — R569 Unspecified convulsions: Secondary | ICD-10-CM

## 2021-05-12 DIAGNOSIS — Z87891 Personal history of nicotine dependence: Secondary | ICD-10-CM | POA: Insufficient documentation

## 2021-05-12 DIAGNOSIS — R404 Transient alteration of awareness: Secondary | ICD-10-CM | POA: Diagnosis not present

## 2021-05-12 DIAGNOSIS — N183 Chronic kidney disease, stage 3 unspecified: Secondary | ICD-10-CM | POA: Insufficient documentation

## 2021-05-12 DIAGNOSIS — E1140 Type 2 diabetes mellitus with diabetic neuropathy, unspecified: Secondary | ICD-10-CM | POA: Diagnosis not present

## 2021-05-12 DIAGNOSIS — E1122 Type 2 diabetes mellitus with diabetic chronic kidney disease: Secondary | ICD-10-CM | POA: Diagnosis not present

## 2021-05-12 DIAGNOSIS — Z79899 Other long term (current) drug therapy: Secondary | ICD-10-CM | POA: Diagnosis not present

## 2021-05-12 DIAGNOSIS — G40909 Epilepsy, unspecified, not intractable, without status epilepticus: Secondary | ICD-10-CM | POA: Insufficient documentation

## 2021-05-12 DIAGNOSIS — E1165 Type 2 diabetes mellitus with hyperglycemia: Secondary | ICD-10-CM | POA: Diagnosis not present

## 2021-05-12 LAB — CBC WITH DIFFERENTIAL/PLATELET
Abs Immature Granulocytes: 0.01 10*3/uL (ref 0.00–0.07)
Basophils Absolute: 0 10*3/uL (ref 0.0–0.1)
Basophils Relative: 0 %
Eosinophils Absolute: 0 10*3/uL (ref 0.0–0.5)
Eosinophils Relative: 0 %
HCT: 29.6 % — ABNORMAL LOW (ref 36.0–46.0)
Hemoglobin: 9.9 g/dL — ABNORMAL LOW (ref 12.0–15.0)
Immature Granulocytes: 0 %
Lymphocytes Relative: 30 %
Lymphs Abs: 1.5 10*3/uL (ref 0.7–4.0)
MCH: 30.3 pg (ref 26.0–34.0)
MCHC: 33.4 g/dL (ref 30.0–36.0)
MCV: 90.5 fL (ref 80.0–100.0)
Monocytes Absolute: 0.4 10*3/uL (ref 0.1–1.0)
Monocytes Relative: 8 %
Neutro Abs: 3 10*3/uL (ref 1.7–7.7)
Neutrophils Relative %: 62 %
Platelets: 248 10*3/uL (ref 150–400)
RBC: 3.27 MIL/uL — ABNORMAL LOW (ref 3.87–5.11)
RDW: 12.2 % (ref 11.5–15.5)
WBC: 4.9 10*3/uL (ref 4.0–10.5)
nRBC: 0 % (ref 0.0–0.2)

## 2021-05-12 LAB — BASIC METABOLIC PANEL
Anion gap: 10 (ref 5–15)
BUN: 17 mg/dL (ref 8–23)
CO2: 26 mmol/L (ref 22–32)
Calcium: 9.1 mg/dL (ref 8.9–10.3)
Chloride: 104 mmol/L (ref 98–111)
Creatinine, Ser: 1.07 mg/dL — ABNORMAL HIGH (ref 0.44–1.00)
GFR, Estimated: 58 mL/min — ABNORMAL LOW (ref >60)
Glucose, Bld: 290 mg/dL — ABNORMAL HIGH (ref 70–99)
Potassium: 3.6 mmol/L (ref 3.5–5.1)
Sodium: 140 mmol/L (ref 135–145)

## 2021-05-12 LAB — TROPONIN I (HIGH SENSITIVITY)
Troponin I (High Sensitivity): 4 ng/L (ref <18)
Troponin I (High Sensitivity): 7 ng/L (ref <18)

## 2021-05-12 MED ORDER — LEVETIRACETAM IN NACL 1000 MG/100ML IV SOLN
1000.0000 mg | Freq: Once | INTRAVENOUS | Status: AC
  Administered 2021-05-12: 1000 mg via INTRAVENOUS
  Filled 2021-05-12: qty 100

## 2021-05-12 MED ORDER — LABETALOL HCL 5 MG/ML IV SOLN
20.0000 mg | Freq: Once | INTRAVENOUS | Status: AC
  Administered 2021-05-12: 20 mg via INTRAVENOUS
  Filled 2021-05-12: qty 4

## 2021-05-12 MED ORDER — AMLODIPINE BESYLATE 5 MG PO TABS
5.0000 mg | ORAL_TABLET | Freq: Once | ORAL | Status: AC
  Administered 2021-05-12: 5 mg via ORAL
  Filled 2021-05-12: qty 1

## 2021-05-12 NOTE — ED Notes (Signed)
Pt becoming more verbal. Reports seizure hx. States she took her daily medications yesterday in the morning for htn, depression and seizures.

## 2021-05-12 NOTE — ED Notes (Signed)
Pt endorsed chest pain for a few days but unable to describe.

## 2021-05-12 NOTE — ED Triage Notes (Signed)
Pt BIB EMS. Daughter reported to EMS that she had seizure like activity at home. Denies pt hit her head. No known hx of seizure. Pt alert to self

## 2021-05-12 NOTE — ED Notes (Signed)
Writer called son and he is coming to pick pt up.

## 2021-05-12 NOTE — ED Notes (Addendum)
Safe transport called and updated that transport to residences is not available on weekends.

## 2021-05-12 NOTE — ED Notes (Signed)
Message left for Tricia Huynh (granddaughter) for her to return call.

## 2021-05-12 NOTE — ED Notes (Signed)
Safe transport arranged for pt

## 2021-05-12 NOTE — ED Provider Notes (Signed)
Tricia Huynh Provider Note   CSN: [id number] Arrival date & time: 05/12/21  0113     History No chief complaint on file.   Tricia Huynh is a 65 y.o. female.  Patient presents to the emergency department for possible seizure.  Patient does have a history of seizure disorder.  There was some type of disturbance in the home tonight that caused her to be stressed and then she had seizure-like activity.  EMS reports some mild confusion during transport.  Patient arrives without any significant complaints.      Past Medical History:  Diagnosis Date   Diabetes mellitus type 2 in nonobese ([place]) 06/06/2015   Diabetes mellitus without complication ([place])    Diabetic neuropathy ([place]) 06/06/2015   Dyslipidemia 06/06/2015   Encephalopathy    Essential hypertension 06/06/2015   Hypertension    Mood disorder ([place]) 06/06/2015   Pneumonia 03/11/2017   Seizure ([place])    sees Tricia Huynh. abd eeg, on keppra    Patient Active Problem List   Diagnosis Date Noted   Colitis 12/15/2020   HCAP (healthcare-associated pneumonia) 03/11/2017   Dyslipidemia 03/11/2017   Diabetes mellitus with complication ([place])    Chest pain 12/30/2016   Seizure disorder ([place]) 12/30/2016   Diabetic ketoacidosis without coma associated with type 2 diabetes mellitus ([place])    DKA (diabetic ketoacidoses) 11/15/2016   UTI (urinary tract infection) 11/15/2016   Acute cystitis without hematuria    Acute renal failure (HCC)    Hyperglycemia 11/14/2016   Diabetes ([place]) 06/05/2016   Altered mental status    CKD (chronic kidney disease) stage 3, GFR 30-59 ml/min ([place]) 05/26/2016   Benign essential HTN 05/26/2016   Seizures ([place]) 01/08/2016   Memory loss 10/04/2015   Acute encephalopathy 06/06/2015   Diabetic neuropathy ([place]) 06/06/2015    Past Surgical History:  Procedure Laterality Date   ABDOMINAL HYSTERECTOMY     EYE SURGERY Bilateral    TUBAL LIGATION       OB History   No  obstetric history on file.     Family History  Problem Relation Age of Onset   Heart disease Father    Kidney disease Father    Diabetes Mother    Seizures Sister    Seizures Brother     Social History   Tobacco Use   Smoking status: Former    Pack years: 0.00    Types: Cigarettes   Smokeless tobacco: Never  Vaping Use   Vaping Use: Never used  Substance Use Topics   Alcohol use: No   Drug use: No    Home Medications Prior to Admission medications   Medication Sig Start Date End Date Taking? Authorizing Provider  Accu-Chek Softclix Lancets lancets 1 each by Other route 2 (two) times daily. E11.9 12/20/19   Renato Shin, MD  acetaminophen (TYLENOL) 500 MG tablet Take 500 mg by mouth every 6 (six) hours as needed for mild pain or headache.    [provider]  amitriptyline (ELAVIL) 50 MG tablet Take 50 mg by mouth at bedtime. 05/08/15   [provider]  amLODipine-olmesartan (AZOR) 5-40 MG tablet Take 1 tablet by mouth daily. 10/08/16   [provider]  aspirin EC 81 MG tablet Take 81 mg by mouth daily.    [provider]  atorvastatin (LIPITOR) 40 MG tablet Take 40 mg by mouth daily. 12/23/19   [provider]  Blood Glucose Monitoring Suppl (ACCU-CHEK AVIVA PLUS) w/Device KIT 1 each by  Does not apply route 2 (two) times daily. E11.9 12/20/19   Renato Shin, MD  gabapentin (NEURONTIN) 100 MG capsule Take 100 mg by mouth 3 (three) times daily. 12/23/19   [provider]  glucose blood (ACCU-CHEK AVIVA PLUS) test strip 1 each by Other route 2 (two) times daily. E11.9 12/20/19   Renato Shin, MD  Insulin Glargine (LANTUS SOLOSTAR) 100 UNIT/ML Solostar Pen Inject 24 Units into the skin every morning. And pen needles 1/day Patient taking differently: Inject 20 Units into the skin 2 (two) times daily. 12/20/19   Renato Shin, MD  levETIRAcetam (KEPPRA) 500 MG tablet Take 1 tablet (500 mg total) by mouth 2 (two) times daily. 02/10/21  05/11/21  Fatima Blank, MD  ondansetron (ZOFRAN ODT) 4 MG disintegrating tablet Take 1 tablet (4 mg total) by mouth every 8 (eight) hours as needed for nausea or vomiting. 01/17/21   Suzy Bouchard, PA-C  ondansetron (ZOFRAN) 4 MG tablet Take 1 tablet (4 mg total) by mouth every 8 (eight) hours as needed for nausea or vomiting. 12/19/20   Shelly Coss, MD  sertraline (ZOLOFT) 50 MG tablet Take 50 mg by mouth daily. 01/07/19   [provider]  vitamin E 400 UNIT capsule Take 400 Units by mouth daily.    [provider]    Allergies    Percocet [oxycodone-acetaminophen] and Vicodin [hydrocodone-acetaminophen]  Review of Systems   Review of Systems  Neurological:  Positive for seizures.  All other systems reviewed and are negative.  Physical Exam Updated Vital Signs BP (!) 144/87   Pulse 74   Temp 98.2 F (36.8 C) (Axillary)   Resp 13   Ht 5' 7"  (1.702 m)   Wt 56.7 kg   SpO2 97%   BMI 19.58 kg/m   Physical Exam Vitals and nursing note reviewed.  Constitutional:      General: She is not in acute distress.    Appearance: Normal appearance. She is well-developed.  HENT:     Head: Normocephalic and atraumatic.     Right Ear: Hearing normal.     Left Ear: Hearing normal.     Nose: Nose normal.  Eyes:     Conjunctiva/sclera: Conjunctivae normal.     Pupils: Pupils are equal, round, and reactive to light.  Cardiovascular:     Rate and Rhythm: Regular rhythm.     Heart sounds: S1 normal and S2 normal. No murmur heard.   No friction rub. No gallop.  Pulmonary:     Effort: Pulmonary effort is normal. No respiratory distress.     Breath sounds: Normal breath sounds.  Chest:     Chest wall: No tenderness.  Abdominal:     General: Bowel sounds are normal.     Palpations: Abdomen is soft.     Tenderness: There is no abdominal tenderness. There is no guarding or rebound. Negative signs include Murphy's sign and McBurney's sign.     Hernia: No hernia  is present.  Musculoskeletal:        General: Normal range of motion.     Cervical back: Normal range of motion and neck supple.  Skin:    General: Skin is warm and dry.     Findings: No rash.  Neurological:     Mental Status: She is alert and oriented to person, place, and time.     GCS: GCS eye subscore is 4. GCS verbal subscore is 5. GCS motor subscore is 6.     Cranial Nerves:  No cranial nerve deficit.     Sensory: No sensory deficit.     Coordination: Coordination normal.  Psychiatric:        Speech: Speech normal.        Behavior: Behavior normal.        Thought Content: Thought content normal.    ED Results / Procedures / Treatments   Labs (all labs ordered are listed, but only abnormal results are displayed) Labs Reviewed  CBC WITH DIFFERENTIAL/PLATELET - Abnormal; Notable for the following components:      Result Value   RBC 3.27 (*)    Hemoglobin 9.9 (*)    HCT 29.6 (*)    All other components within normal limits  BASIC METABOLIC PANEL - Abnormal; Notable for the following components:   Glucose, Bld 290 (*)    Creatinine, Ser 1.07 (*)    GFR, Estimated 58 (*)    All other components within normal limits  TROPONIN I (HIGH SENSITIVITY)  TROPONIN I (HIGH SENSITIVITY)    EKG EKG Interpretation  Date/Time:  Sunday May 12 2021 01:30:33 EDT Ventricular Rate:  99 PR Interval:    QRS Duration: 113 QT Interval:  346 QTC Calculation: 444 R Axis:   -41 Text Interpretation: Sinus rhythm Borderline IVCD with LAD Abnormal R-wave progression, early transition Inferior infarct, old No significant change since last tracing Confirmed by Orpah Greek [phone]) on 05/12/2021 1:36:57 AM  Radiology No results found.  Procedures Procedures   Medications Ordered in ED Medications  amLODipine (NORVASC) tablet 5 mg (has no administration in time range)  levETIRAcetam (KEPPRA) IVPB 1000 mg/100 mL premix (0 mg Intravenous Stopped 05/12/21 0206)  labetalol (NORMODYNE)  injection 20 mg (20 mg Intravenous Given 05/12/21 0148)    ED Course  I have reviewed the triage vital signs and the nursing notes.  Pertinent labs & imaging results that were available during my care of the patient were reviewed by me and considered in my medical decision making (see chart for details).    MDM Rules/Calculators/A&P                          Patient presents to the emergency department after a seizure.  Patient does have a known seizure disorder.  She was postictal on arrival.  Patient also hypertensive.  She was treated with IV Keppra and labetalol.  Vital signs significantly improved.  She has been monitored through the night.  She has done well.  She is now awake, alert and at her normal baseline.  She reports that she has been using her Keppra as prescribed.  She also has been taking her blood pressure meds.  Presentation likely a breakthrough seizure secondary to the stressful event that occurred at her home last night.  She will be appropriate for discharge, continue her current medications.  Final Clinical Impression(s) / ED Diagnoses Final diagnoses:  Seizure [facility])  Primary hypertension    Rx / DC Orders ED Discharge Orders     None        Jizel Cheeks, Gwenyth Allegra, MD 05/12/21 [phone]

## 2021-05-12 NOTE — ED Notes (Signed)
Pt verbalized understanding of d/c and follow up care. 

## 2021-05-30 ENCOUNTER — Other Ambulatory Visit: Payer: Self-pay

## 2021-05-30 ENCOUNTER — Emergency Department (HOSPITAL_COMMUNITY)
Admission: EM | Admit: 2021-05-30 | Discharge: 2021-05-30 | Disposition: A | Discharge status: Home / Self Care | Payer: Medicare Other | Attending: Emergency Medicine | Admitting: Emergency Medicine

## 2021-05-30 ENCOUNTER — Encounter (HOSPITAL_COMMUNITY): Payer: Self-pay

## 2021-05-30 ENCOUNTER — Emergency Department (HOSPITAL_COMMUNITY): Payer: Medicare Other

## 2021-05-30 DIAGNOSIS — E1110 Type 2 diabetes mellitus with ketoacidosis without coma: Secondary | ICD-10-CM | POA: Insufficient documentation

## 2021-05-30 DIAGNOSIS — Z20822 Contact with and (suspected) exposure to covid-19: Secondary | ICD-10-CM | POA: Insufficient documentation

## 2021-05-30 DIAGNOSIS — I129 Hypertensive chronic kidney disease with stage 1 through stage 4 chronic kidney disease, or unspecified chronic kidney disease: Secondary | ICD-10-CM | POA: Insufficient documentation

## 2021-05-30 DIAGNOSIS — E860 Dehydration: Secondary | ICD-10-CM

## 2021-05-30 DIAGNOSIS — D649 Anemia, unspecified: Secondary | ICD-10-CM | POA: Diagnosis not present

## 2021-05-30 DIAGNOSIS — E785 Hyperlipidemia, unspecified: Secondary | ICD-10-CM | POA: Insufficient documentation

## 2021-05-30 DIAGNOSIS — Z79899 Other long term (current) drug therapy: Secondary | ICD-10-CM | POA: Diagnosis not present

## 2021-05-30 DIAGNOSIS — Z87891 Personal history of nicotine dependence: Secondary | ICD-10-CM | POA: Insufficient documentation

## 2021-05-30 DIAGNOSIS — E1122 Type 2 diabetes mellitus with diabetic chronic kidney disease: Secondary | ICD-10-CM | POA: Diagnosis not present

## 2021-05-30 DIAGNOSIS — E1169 Type 2 diabetes mellitus with other specified complication: Secondary | ICD-10-CM | POA: Diagnosis not present

## 2021-05-30 DIAGNOSIS — R079 Chest pain, unspecified: Secondary | ICD-10-CM

## 2021-05-30 DIAGNOSIS — R000 Tachycardia, unspecified: Secondary | ICD-10-CM | POA: Diagnosis not present

## 2021-05-30 DIAGNOSIS — N183 Chronic kidney disease, stage 3 unspecified: Secondary | ICD-10-CM | POA: Insufficient documentation

## 2021-05-30 DIAGNOSIS — Z7982 Long term (current) use of aspirin: Secondary | ICD-10-CM | POA: Insufficient documentation

## 2021-05-30 DIAGNOSIS — R0789 Other chest pain: Secondary | ICD-10-CM | POA: Diagnosis not present

## 2021-05-30 DIAGNOSIS — E1140 Type 2 diabetes mellitus with diabetic neuropathy, unspecified: Secondary | ICD-10-CM | POA: Insufficient documentation

## 2021-05-30 DIAGNOSIS — Z794 Long term (current) use of insulin: Secondary | ICD-10-CM | POA: Diagnosis not present

## 2021-05-30 DIAGNOSIS — I10 Essential (primary) hypertension: Secondary | ICD-10-CM | POA: Diagnosis not present

## 2021-05-30 LAB — CBC
HCT: 31.3 % — ABNORMAL LOW (ref 36.0–46.0)
Hemoglobin: 10.3 g/dL — ABNORMAL LOW (ref 12.0–15.0)
MCH: 30 pg (ref 26.0–34.0)
MCHC: 32.9 g/dL (ref 30.0–36.0)
MCV: 91.3 fL (ref 80.0–100.0)
Platelets: 232 10*3/uL (ref 150–400)
RBC: 3.43 MIL/uL — ABNORMAL LOW (ref 3.87–5.11)
RDW: 12.3 % (ref 11.5–15.5)
WBC: 4.8 10*3/uL (ref 4.0–10.5)
nRBC: 0 % (ref 0.0–0.2)

## 2021-05-30 LAB — RESP PANEL BY RT-PCR (FLU A&B, COVID) ARPGX2
Influenza A by PCR: NEGATIVE
Influenza B by PCR: NEGATIVE
SARS Coronavirus 2 by RT PCR: NEGATIVE

## 2021-05-30 LAB — COMPREHENSIVE METABOLIC PANEL
ALT: 12 U/L (ref 0–44)
AST: 17 U/L (ref 15–41)
Albumin: 3.7 g/dL (ref 3.5–5.0)
Alkaline Phosphatase: 125 U/L (ref 38–126)
Anion gap: 6 (ref 5–15)
BUN: 20 mg/dL (ref 8–23)
CO2: 27 mmol/L (ref 22–32)
Calcium: 9.4 mg/dL (ref 8.9–10.3)
Chloride: 110 mmol/L (ref 98–111)
Creatinine, Ser: 1.14 mg/dL — ABNORMAL HIGH (ref 0.44–1.00)
GFR, Estimated: 53 mL/min — ABNORMAL LOW (ref >60)
Glucose, Bld: 125 mg/dL — ABNORMAL HIGH (ref 70–99)
Potassium: 4.1 mmol/L (ref 3.5–5.1)
Sodium: 143 mmol/L (ref 135–145)
Total Bilirubin: 0.6 mg/dL (ref 0.3–1.2)
Total Protein: 7.9 g/dL (ref 6.5–8.1)

## 2021-05-30 LAB — CBG MONITORING, ED: Glucose-Capillary: 124 mg/dL — ABNORMAL HIGH (ref 70–99)

## 2021-05-30 LAB — TROPONIN I (HIGH SENSITIVITY)
Troponin I (High Sensitivity): 3 ng/L (ref <18)
Troponin I (High Sensitivity): 3 ng/L (ref <18)

## 2021-05-30 LAB — D-DIMER, QUANTITATIVE: D-Dimer, Quant: 0.43 ug/mL-FEU (ref 0.00–0.50)

## 2021-05-30 LAB — TSH: TSH: 0.646 u[IU]/mL (ref 0.350–4.500)

## 2021-05-30 MED ORDER — SODIUM CHLORIDE 0.9 % IV BOLUS
1000.0000 mL | Freq: Once | INTRAVENOUS | Status: AC
  Administered 2021-05-30: 1000 mL via INTRAVENOUS

## 2021-05-30 MED ORDER — LABETALOL HCL 5 MG/ML IV SOLN
20.0000 mg | Freq: Once | INTRAVENOUS | Status: AC
  Administered 2021-05-30: 20 mg via INTRAVENOUS
  Filled 2021-05-30: qty 4

## 2021-05-30 NOTE — ED Notes (Signed)
Dr. Joya Gaskins aware pt BP 195/108.

## 2021-05-30 NOTE — ED Provider Notes (Signed)
[facility] DEPT Provider Note   CSN: [id number] Arrival date & time: 05/30/21  1656     History Chief Complaint  Patient presents with   Chest Pain    Tricia Huynh is a 65 y.o. female.  She reports that she has been having pain off and on for about a month.  It is worse with exertion and improved with rest.  She has also been having night sweats and weight loss.  She states that she has early satiety which prevents her from eating regular quantities of food.  Echocardiogram performed 2015.  EF normal.   Chest Pain Associated symptoms: diaphoresis and nausea   Associated symptoms: no abdominal pain, no back pain, no cough, no fever, no palpitations, no shortness of breath and no vomiting    HPI: A 65 year old patient with a history of treated diabetes and hypertension presents for evaluation of chest pain. Initial onset of pain was less than one hour ago. The patient's chest pain is described as heaviness/pressure/tightness and is worse with exertion. The patient complains of nausea. The patient's chest pain is not middle- or left-sided, is not well-localized, is not sharp and does radiate to the arms/jaw/neck. The patient denies diaphoresis. The patient has no history of stroke, has no history of peripheral artery disease, has not smoked in the past 90 days, has no relevant family history of coronary artery disease (first degree relative at less than age 98), has no history of hypercholesterolemia and does not have an elevated BMI (>=30).   Past Medical History:  Diagnosis Date   Diabetes mellitus type 2 in nonobese ([place]) 06/06/2015   Diabetes mellitus without complication ([place])    Diabetic neuropathy ([place]) 06/06/2015   Dyslipidemia 06/06/2015   Encephalopathy    Essential hypertension 06/06/2015   Hypertension    Mood disorder ([place]) 06/06/2015   Pneumonia 03/11/2017   Seizure ([place])    sees [name]. abd eeg, on keppra    Patient Active Problem  List   Diagnosis Date Noted   Colitis 12/15/2020   HCAP (healthcare-associated pneumonia) 03/11/2017   Dyslipidemia 03/11/2017   Diabetes mellitus with complication ([place])    Chest pain 12/30/2016   Seizure disorder ([place]) 12/30/2016   Diabetic ketoacidosis without coma associated with type 2 diabetes mellitus ([place])    DKA (diabetic ketoacidoses) 11/15/2016   UTI (urinary tract infection) 11/15/2016   Acute cystitis without hematuria    Acute renal failure (HCC)    Hyperglycemia 11/14/2016   Diabetes ([place]) 06/05/2016   Altered mental status    CKD (chronic kidney disease) stage 3, GFR 30-59 ml/min ([place]) 05/26/2016   Benign essential HTN 05/26/2016   Seizures ([place]) 01/08/2016   Memory loss 10/04/2015   Acute encephalopathy 06/06/2015   Diabetic neuropathy ([place]) 06/06/2015    Past Surgical History:  Procedure Laterality Date   ABDOMINAL HYSTERECTOMY     EYE SURGERY Bilateral    TUBAL LIGATION       OB History   No obstetric history on file.     Family History  Problem Relation Age of Onset   Heart disease Father    Kidney disease Father    Diabetes Mother    Seizures Sister    Seizures Brother     Social History   Tobacco Use   Smoking status: Former    Types: Cigarettes   Smokeless tobacco: Never  Vaping Use   Vaping Use: Never used  Substance Use Topics   Alcohol use: No  Drug use: No    Home Medications Prior to Admission medications   Medication Sig Start Date End Date Taking? Authorizing Provider  Accu-Chek Softclix Lancets lancets 1 each by Other route 2 (two) times daily. E11.9 12/20/19   Renato Shin, MD  acetaminophen (TYLENOL) 500 MG tablet Take 500 mg by mouth every 6 (six) hours as needed for mild pain or headache.    [provider]  amitriptyline (ELAVIL) 50 MG tablet Take 50 mg by mouth at bedtime. 05/08/15   [provider]  amLODipine-olmesartan (AZOR) 5-40 MG tablet Take 1 tablet by mouth daily. 10/08/16   [provider]  aspirin EC 81 MG tablet Take 81 mg by mouth daily.    [provider]  atorvastatin (LIPITOR) 40 MG tablet Take 40 mg by mouth daily. 12/23/19   [provider]  Blood Glucose Monitoring Suppl (ACCU-CHEK AVIVA PLUS) w/Device KIT 1 each by Does not apply route 2 (two) times daily. E11.9 12/20/19   Renato Shin, MD  gabapentin (NEURONTIN) 100 MG capsule Take 100 mg by mouth 3 (three) times daily. 12/23/19   [provider]  glucose blood (ACCU-CHEK AVIVA PLUS) test strip 1 each by Other route 2 (two) times daily. E11.9 12/20/19   Renato Shin, MD  Insulin Glargine (LANTUS SOLOSTAR) 100 UNIT/ML Solostar Pen Inject 24 Units into the skin every morning. And pen needles 1/day Patient taking differently: Inject 20 Units into the skin 2 (two) times daily. 12/20/19   Renato Shin, MD  levETIRAcetam (KEPPRA) 500 MG tablet Take 1 tablet (500 mg total) by mouth 2 (two) times daily. 02/10/21 05/11/21  Fatima Blank, MD  ondansetron (ZOFRAN ODT) 4 MG disintegrating tablet Take 1 tablet (4 mg total) by mouth every 8 (eight) hours as needed for nausea or vomiting. 01/17/21   Suzy Bouchard, PA-C  ondansetron (ZOFRAN) 4 MG tablet Take 1 tablet (4 mg total) by mouth every 8 (eight) hours as needed for nausea or vomiting. 12/19/20   Shelly Coss, MD  sertraline (ZOLOFT) 50 MG tablet Take 50 mg by mouth daily. 01/07/19   [provider]  vitamin E 400 UNIT capsule Take 400 Units by mouth daily.    [provider]    Allergies    Percocet [oxycodone-acetaminophen] and Vicodin [hydrocodone-acetaminophen]  Review of Systems   Review of Systems  Constitutional:  Positive for diaphoresis and unexpected weight change. Negative for chills and fever.  HENT:  Negative for ear pain and sore throat.   Eyes:  Negative for pain and visual disturbance.  Respiratory:  Negative for cough and shortness of breath.   Cardiovascular:  Positive for chest pain. Negative  for palpitations.  Gastrointestinal:  Positive for nausea. Negative for abdominal pain and vomiting.  Genitourinary:  Negative for dysuria and hematuria.  Musculoskeletal:  Negative for arthralgias and back pain.  Skin:  Negative for color change and rash.  Neurological:  Negative for seizures and syncope.  All other systems reviewed and are negative.  Physical Exam Updated Vital Signs BP (!) 195/108   Pulse (!) 102   Temp 98.2 F (36.8 C) (Oral)   Resp 16   SpO2 99%   Physical Exam Vitals and nursing note reviewed.  Constitutional:      Appearance: She is well-developed.  HENT:     Head: Normocephalic and atraumatic.  Cardiovascular:     Rate and Rhythm: Regular rhythm. Tachycardia present.     Heart sounds: Normal heart sounds.  Pulmonary:  Effort: Pulmonary effort is normal. No tachypnea.     Breath sounds: Normal breath sounds.  Abdominal:     Palpations: Abdomen is soft.     Tenderness: There is no abdominal tenderness.  Musculoskeletal:     Right lower leg: No edema.     Left lower leg: No edema.  Skin:    General: Skin is warm and dry.  Neurological:     General: No focal deficit present.     Mental Status: She is alert and oriented to person, place, and time.  Psychiatric:        Mood and Affect: Mood normal.        Behavior: Behavior normal.    ED Results / Procedures / Treatments   Labs (all labs ordered are listed, but only abnormal results are displayed) Labs Reviewed  CBC - Abnormal; Notable for the following components:      Result Value   RBC 3.43 (*)    Hemoglobin 10.3 (*)    HCT 31.3 (*)    All other components within normal limits  COMPREHENSIVE METABOLIC PANEL - Abnormal; Notable for the following components:   Glucose, Bld 125 (*)    Creatinine, Ser 1.14 (*)    GFR, Estimated 53 (*)    All other components within normal limits  CBG MONITORING, ED - Abnormal; Notable for the following components:   Glucose-Capillary 124 (*)    All  other components within normal limits  RESP PANEL BY RT-PCR (FLU A&B, COVID) ARPGX2  D-DIMER, QUANTITATIVE  TSH  TROPONIN I (HIGH SENSITIVITY)  TROPONIN I (HIGH SENSITIVITY)    EKG EKG Interpretation  Date/Time:  Thursday May 30 2021 17:15:46 EDT Ventricular Rate:  96 PR Interval:  191 QRS Duration: 80 QT Interval:  363 QTC Calculation: 459 R Axis:   6 Text Interpretation: Sinus rhythm Probable left atrial enlargement No acute ischemia Confirmed by Lorre Munroe ([id number]) on 05/30/2021 5:25:25 PM  Radiology DG Chest Port 1 View  Result Date: 05/30/2021 CLINICAL DATA:  Chest pain. Additional provided: Patient reports intermittent chest pain for 1 month. Pounding heartbeat for 1 month. Lightheadedness. EXAM: PORTABLE CHEST 1 VIEW COMPARISON:  Prior chest radiographs 04/08/2021 and earlier. FINDINGS: Heart size within normal limits. Aortic atherosclerosis. No appreciable airspace consolidation or pulmonary edema. No evidence of pleural effusion or pneumothorax. No acute bony abnormality identified. IMPRESSION: No evidence of acute cardiopulmonary abnormality. Aortic Atherosclerosis (ICD10-I70.0). Electronically Signed   By: Kellie Simmering DO   On: 05/30/2021 17:38    Procedures Procedures   Medications Ordered in ED Medications  labetalol (NORMODYNE) injection 20 mg (20 mg Intravenous Given 05/30/21 1751)  sodium chloride 0.9 % bolus 1,000 mL (0 mLs Intravenous Stopped 05/30/21 2115)    ED Course  I have reviewed the triage vital signs and the nursing notes.  Pertinent labs & imaging results that were available during my care of the patient were reviewed by me and considered in my medical decision making (see chart for details).    MDM Rules/Calculators/A&P HEAR Score: 5                        [name] presented to the ER with intermittent chest pain x1 month.  She was noted to be quite hypertensive and tachycardic.  She also endorsed some weight loss and loss of  appetite.  She has known anemia, and she is awaiting a GI work-up.  She was evaluated for evidence of ACS,  PE, pneumonia, hyperthyroidism, COVID-19.  ED work-up was within normal limits.  Heart rate did respond to IV fluids.  Blood pressure responded to labetalol.  She was counseled to closely follow with her primary care doctor and/or cardiology. Final Clinical Impression(s) / ED Diagnoses Final diagnoses:  Chest pain, unspecified type  Primary hypertension  Dehydration  Anemia, unspecified type    Rx / DC Orders ED Discharge Orders     None        Arnaldo Natal, MD 05/30/21 2243

## 2021-05-30 NOTE — ED Notes (Signed)
Dr. Joya Gaskins aware pt BP 201/126 in triage.

## 2021-05-30 NOTE — ED Triage Notes (Signed)
Pt c/o intermittent chest pain x1 month. Pt c/o pounding heart beat x1 month. Pt states her PCP sent her to ED to get checked out. Pt c/o being lightheaded at times.

## 2021-06-15 ENCOUNTER — Encounter (HOSPITAL_COMMUNITY): Payer: Self-pay | Admitting: Emergency Medicine

## 2021-06-15 ENCOUNTER — Emergency Department (HOSPITAL_COMMUNITY)
Admission: EM | Admit: 2021-06-15 | Discharge: 2021-06-15 | Disposition: A | Discharge status: Home / Self Care | Payer: Medicare Other | Attending: Emergency Medicine | Admitting: Emergency Medicine

## 2021-06-15 ENCOUNTER — Emergency Department (HOSPITAL_COMMUNITY): Payer: Medicare Other

## 2021-06-15 DIAGNOSIS — E1140 Type 2 diabetes mellitus with diabetic neuropathy, unspecified: Secondary | ICD-10-CM | POA: Insufficient documentation

## 2021-06-15 DIAGNOSIS — Z87891 Personal history of nicotine dependence: Secondary | ICD-10-CM | POA: Diagnosis not present

## 2021-06-15 DIAGNOSIS — Z7982 Long term (current) use of aspirin: Secondary | ICD-10-CM | POA: Insufficient documentation

## 2021-06-15 DIAGNOSIS — Z794 Long term (current) use of insulin: Secondary | ICD-10-CM | POA: Insufficient documentation

## 2021-06-15 DIAGNOSIS — N183 Chronic kidney disease, stage 3 unspecified: Secondary | ICD-10-CM | POA: Diagnosis not present

## 2021-06-15 DIAGNOSIS — R1031 Right lower quadrant pain: Secondary | ICD-10-CM | POA: Diagnosis not present

## 2021-06-15 DIAGNOSIS — R1032 Left lower quadrant pain: Secondary | ICD-10-CM | POA: Diagnosis not present

## 2021-06-15 DIAGNOSIS — R109 Unspecified abdominal pain: Secondary | ICD-10-CM | POA: Diagnosis not present

## 2021-06-15 DIAGNOSIS — Z90710 Acquired absence of both cervix and uterus: Secondary | ICD-10-CM | POA: Diagnosis not present

## 2021-06-15 DIAGNOSIS — I129 Hypertensive chronic kidney disease with stage 1 through stage 4 chronic kidney disease, or unspecified chronic kidney disease: Secondary | ICD-10-CM | POA: Insufficient documentation

## 2021-06-15 DIAGNOSIS — R3915 Urgency of urination: Secondary | ICD-10-CM | POA: Insufficient documentation

## 2021-06-15 DIAGNOSIS — Z87440 Personal history of urinary (tract) infections: Secondary | ICD-10-CM | POA: Diagnosis not present

## 2021-06-15 DIAGNOSIS — Z79899 Other long term (current) drug therapy: Secondary | ICD-10-CM | POA: Insufficient documentation

## 2021-06-15 DIAGNOSIS — E1122 Type 2 diabetes mellitus with diabetic chronic kidney disease: Secondary | ICD-10-CM | POA: Insufficient documentation

## 2021-06-15 DIAGNOSIS — R1030 Lower abdominal pain, unspecified: Secondary | ICD-10-CM | POA: Diagnosis not present

## 2021-06-15 LAB — COMPREHENSIVE METABOLIC PANEL
ALT: 15 U/L (ref 0–44)
AST: 18 U/L (ref 15–41)
Albumin: 3.9 g/dL (ref 3.5–5.0)
Alkaline Phosphatase: 131 U/L — ABNORMAL HIGH (ref 38–126)
Anion gap: 8 (ref 5–15)
BUN: 21 mg/dL (ref 8–23)
CO2: 26 mmol/L (ref 22–32)
Calcium: 9.3 mg/dL (ref 8.9–10.3)
Chloride: 109 mmol/L (ref 98–111)
Creatinine, Ser: 1.19 mg/dL — ABNORMAL HIGH (ref 0.44–1.00)
GFR, Estimated: 51 mL/min — ABNORMAL LOW (ref >60)
Glucose, Bld: 199 mg/dL — ABNORMAL HIGH (ref 70–99)
Potassium: 3.6 mmol/L (ref 3.5–5.1)
Sodium: 143 mmol/L (ref 135–145)
Total Bilirubin: 0.5 mg/dL (ref 0.3–1.2)
Total Protein: 8.3 g/dL — ABNORMAL HIGH (ref 6.5–8.1)

## 2021-06-15 LAB — CBC WITH DIFFERENTIAL/PLATELET
Abs Immature Granulocytes: 0.01 10*3/uL (ref 0.00–0.07)
Basophils Absolute: 0 10*3/uL (ref 0.0–0.1)
Basophils Relative: 0 %
Eosinophils Absolute: 0 10*3/uL (ref 0.0–0.5)
Eosinophils Relative: 0 %
HCT: 31.5 % — ABNORMAL LOW (ref 36.0–46.0)
Hemoglobin: 10.4 g/dL — ABNORMAL LOW (ref 12.0–15.0)
Immature Granulocytes: 0 %
Lymphocytes Relative: 34 %
Lymphs Abs: 2 10*3/uL (ref 0.7–4.0)
MCH: 30.5 pg (ref 26.0–34.0)
MCHC: 33 g/dL (ref 30.0–36.0)
MCV: 92.4 fL (ref 80.0–100.0)
Monocytes Absolute: 0.3 10*3/uL (ref 0.1–1.0)
Monocytes Relative: 6 %
Neutro Abs: 3.5 10*3/uL (ref 1.7–7.7)
Neutrophils Relative %: 60 %
Platelets: 246 10*3/uL (ref 150–400)
RBC: 3.41 MIL/uL — ABNORMAL LOW (ref 3.87–5.11)
RDW: 12.8 % (ref 11.5–15.5)
WBC: 5.9 10*3/uL (ref 4.0–10.5)
nRBC: 0 % (ref 0.0–0.2)

## 2021-06-15 LAB — URINALYSIS, ROUTINE W REFLEX MICROSCOPIC
Bacteria, UA: NONE SEEN
Bilirubin Urine: NEGATIVE
Glucose, UA: 50 mg/dL — AB
Hgb urine dipstick: NEGATIVE
Ketones, ur: NEGATIVE mg/dL
Leukocytes,Ua: NEGATIVE
Nitrite: NEGATIVE
Protein, ur: >=300 mg/dL — AB
Specific Gravity, Urine: 1.023 (ref 1.005–1.030)
pH: 5 (ref 5.0–8.0)

## 2021-06-15 LAB — LIPASE, BLOOD: Lipase: 40 U/L (ref 11–51)

## 2021-06-15 MED ORDER — IOHEXOL 350 MG/ML SOLN
80.0000 mL | Freq: Once | INTRAVENOUS | Status: AC | PRN
  Administered 2021-06-15: 80 mL via INTRAVENOUS

## 2021-06-15 NOTE — ED Provider Notes (Signed)
Emergency Medicine Provider Triage Evaluation Note  Tricia Huynh , a 65 y.o. female  was evaluated in triage.  Pt complains of lower abdominal pain.  Reports a history of similar symptoms and feels that they began worsening about 1 week ago.  States it is intermittent.  Denies any fevers, chills, URI symptoms, chest pain, shortness of breath, nausea, vomiting, diarrhea.  Patient denies any vaginal bleeding or vaginal discharge.  No urinary complaints.  She does note chronic hemorrhoids and reports bleeding in the region intermittently when her hemorrhoids worsen.  Physical Exam  BP (!) 197/110 (BP Location: Left Arm)   Pulse 87   Temp 98.2 F (36.8 C) (Oral)   Resp 16   SpO2 98%  Gen:   Awake, no distress   Resp:  Normal effort  MSK:   Moves extremities without difficulty  Other:    Medical Decision Making  Medically screening exam initiated at 4:08 PM.  Appropriate orders placed.  Tricia Huynh was informed that the remainder of the evaluation will be completed by another provider, this initial triage assessment does not replace that evaluation, and the importance of remaining in the ED until their evaluation is complete.   Rayna Sexton, PA-C 06/15/21 1609    Merryl Hacker, MD 06/16/21 [id number]

## 2021-06-15 NOTE — ED Provider Notes (Signed)
[facility] DEPT Provider Note   CSN: [id number] Arrival date & time: 06/15/21  1542     History Chief Complaint  Patient presents with   Abdominal Pain    Tricia Huynh is a 65 y.o. female.  HPI     This is a 65 year old female with a history of diabetes, hypertension who presents with lower abdominal pain.  She reports 1 week history of diffuse lower abdominal pain.  She reports that her sharp and nonradiating.  Currently pain is 4 out of 10.  She states she has had pain similar to this in the past and has had a urinary tract infection.  She reports increased urination but no dysuria or hematuria.  No back pain.  No fevers.  Denies nausea, vomiting, diarrhea, constipation.  She has had an abdominal hysterectomy.  Past Medical History:  Diagnosis Date   Diabetes mellitus type 2 in nonobese ([place]) 06/06/2015   Diabetes mellitus without complication ([place])    Diabetic neuropathy ([place]) 06/06/2015   Dyslipidemia 06/06/2015   Encephalopathy    Essential hypertension 06/06/2015   Hypertension    Mood disorder ([place]) 06/06/2015   Pneumonia 03/11/2017   Seizure ([place])    sees [name]. abd eeg, on keppra    Patient Active Problem List   Diagnosis Date Noted   Colitis 12/15/2020   HCAP (healthcare-associated pneumonia) 03/11/2017   Dyslipidemia 03/11/2017   Diabetes mellitus with complication ([place])    Chest pain 12/30/2016   Seizure disorder ([place]) 12/30/2016   Diabetic ketoacidosis without coma associated with type 2 diabetes mellitus ([place])    DKA (diabetic ketoacidoses) 11/15/2016   UTI (urinary tract infection) 11/15/2016   Acute cystitis without hematuria    Acute renal failure (HCC)    Hyperglycemia 11/14/2016   Diabetes ([place]) 06/05/2016   Altered mental status    CKD (chronic kidney disease) stage 3, GFR 30-59 ml/min ([place]) 05/26/2016   Benign essential HTN 05/26/2016   Seizures ([place]) 01/08/2016   Memory loss 10/04/2015   Acute  encephalopathy 06/06/2015   Diabetic neuropathy ([place]) 06/06/2015    Past Surgical History:  Procedure Laterality Date   ABDOMINAL HYSTERECTOMY     EYE SURGERY Bilateral    TUBAL LIGATION       OB History   No obstetric history on file.     Family History  Problem Relation Age of Onset   Heart disease Father    Kidney disease Father    Diabetes Mother    Seizures Sister    Seizures Brother     Social History   Tobacco Use   Smoking status: Former    Types: Cigarettes   Smokeless tobacco: Never  Vaping Use   Vaping Use: Never used  Substance Use Topics   Alcohol use: No   Drug use: No    Home Medications Prior to Admission medications   Medication Sig Start Date End Date Taking? Authorizing Provider  Accu-Chek Softclix Lancets lancets 1 each by Other route 2 (two) times daily. E11.9 12/20/19   Renato Shin, MD  acetaminophen (TYLENOL) 500 MG tablet Take 1,000 mg by mouth every 6 (six) hours as needed for mild pain or headache.    [provider]  amitriptyline (ELAVIL) 50 MG tablet Take 50 mg by mouth daily. 05/08/15   [provider]  amLODipine-olmesartan (AZOR) 5-40 MG tablet Take 1 tablet by mouth daily. 10/08/16   [provider]  aspirin EC 81 MG tablet Take 81 mg by mouth  daily.    [provider]  atorvastatin (LIPITOR) 40 MG tablet Take 40 mg by mouth daily. 12/23/19   [provider]  Blood Glucose Monitoring Suppl (ACCU-CHEK AVIVA PLUS) w/Device KIT 1 each by Does not apply route 2 (two) times daily. E11.9 12/20/19   Renato Shin, MD  gabapentin (NEURONTIN) 100 MG capsule Take 100 mg by mouth 3 (three) times daily. 12/23/19   [provider]  glucose blood (ACCU-CHEK AVIVA PLUS) test strip 1 each by Other route 2 (two) times daily. E11.9 12/20/19   Renato Shin, MD  hydrocortisone (ANUSOL-HC) 2.5 % rectal cream Apply 1 application topically 2 (two) times daily. 03/25/21   [provider]  Insulin Glargine  (LANTUS SOLOSTAR) 100 UNIT/ML Solostar Pen Inject 24 Units into the skin every morning. And pen needles 1/day Patient taking differently: Inject 20 Units into the skin 2 (two) times daily. 12/20/19   Renato Shin, MD  levETIRAcetam (KEPPRA) 500 MG tablet Take 1 tablet (500 mg total) by mouth 2 (two) times daily. 02/10/21 05/30/21  Fatima Blank, MD  ondansetron (ZOFRAN ODT) 4 MG disintegrating tablet Take 1 tablet (4 mg total) by mouth every 8 (eight) hours as needed for nausea or vomiting. Patient not taking: No sig reported 01/17/21   Charmaine Downs C, PA-C  ondansetron (ZOFRAN) 4 MG tablet Take 1 tablet (4 mg total) by mouth every 8 (eight) hours as needed for nausea or vomiting. Patient not taking: No sig reported 12/19/20   Shelly Coss, MD  sertraline (ZOLOFT) 50 MG tablet Take 50 mg by mouth daily. 01/07/19   [provider]    Allergies    Percocet [oxycodone-acetaminophen] and Vicodin [hydrocodone-acetaminophen]  Review of Systems   Review of Systems  Constitutional:  Negative for fever.  Cardiovascular:  Negative for chest pain.  Gastrointestinal:  Positive for abdominal pain. Negative for constipation, diarrhea, nausea and vomiting.  Genitourinary:  Negative for dysuria and hematuria.  All other systems reviewed and are negative.  Physical Exam Updated Vital Signs BP (!) 188/101 (BP Location: Left Arm)   Pulse 73   Temp 98.2 F (36.8 C) (Oral)   Resp 18   SpO2 100%   Physical Exam Vitals and nursing note reviewed.  Constitutional:      Appearance: She is well-developed. She is not ill-appearing.  HENT:     Head: Normocephalic and atraumatic.     Mouth/Throat:     Mouth: Mucous membranes are moist.  Eyes:     Pupils: Pupils are equal, round, and reactive to light.  Cardiovascular:     Rate and Rhythm: Normal rate and regular rhythm.     Heart sounds: Normal heart sounds.  Pulmonary:     Effort: Pulmonary effort is normal. No respiratory distress.      Breath sounds: No wheezing.  Abdominal:     General: Bowel sounds are normal.     Palpations: Abdomen is soft.     Tenderness: There is abdominal tenderness in the right lower quadrant, suprapubic area and left lower quadrant.  Musculoskeletal:     Cervical back: Neck supple.  Skin:    General: Skin is warm and dry.  Neurological:     Mental Status: She is alert and oriented to person, place, and time.  Psychiatric:        Mood and Affect: Mood normal.    ED Results / Procedures / Treatments   Labs (all labs ordered are listed, but only abnormal results are displayed) Labs  Reviewed  COMPREHENSIVE METABOLIC PANEL - Abnormal; Notable for the following components:      Result Value   Glucose, Bld 199 (*)    Creatinine, Ser 1.19 (*)    Total Protein 8.3 (*)    Alkaline Phosphatase 131 (*)    GFR, Estimated 51 (*)    All other components within normal limits  CBC WITH DIFFERENTIAL/PLATELET - Abnormal; Notable for the following components:   RBC 3.41 (*)    Hemoglobin 10.4 (*)    HCT 31.5 (*)    All other components within normal limits  URINALYSIS, ROUTINE W REFLEX MICROSCOPIC - Abnormal; Notable for the following components:   APPearance HAZY (*)    Glucose, UA 50 (*)    Protein, ur >=300 (*)    All other components within normal limits  LIPASE, BLOOD    EKG None  Radiology CT ABDOMEN PELVIS W CONTRAST  Result Date: 06/15/2021 CLINICAL DATA:  Lower abdominal pain for a week. Bowel movement last night. No nausea, vomiting, or diarrhea. EXAM: CT ABDOMEN AND PELVIS WITH CONTRAST TECHNIQUE: Multidetector CT imaging of the abdomen and pelvis was performed using the standard protocol following bolus administration of intravenous contrast. CONTRAST:  61mL OMNIPAQUE IOHEXOL 350 MG/ML SOLN COMPARISON:  January 17, 2021 FINDINGS: Lower chest: No acute abnormality. Hepatobiliary: No focal liver abnormality is seen. No gallstones, gallbladder wall thickening, or biliary dilatation.  Pancreas: Unremarkable. No pancreatic ductal dilatation or surrounding inflammatory changes. Spleen: Normal in size without focal abnormality. Adrenals/Urinary Tract: Adrenal glands are normal. Tiny low-attenuation lesions of both kidneys are too small to characterize but almost certainly tiny cysts. No hydronephrosis or perinephric stranding. The ureters are normal. The bladder is poorly evaluated due to lack of distention but no abnormalities are seen. Stomach/Bowel: The stomach is normal. No small bowel obstruction identified. The colon is unremarkable. The appendix is normal. Vascular/Lymphatic: The abdominal aorta is normal in caliber. Calcified atherosclerotic change at the origin of both renal arteries left greater than right. No aneurysm or dissection in the abdominal aorta. No adenopathy. Reproductive: Status post hysterectomy. No adnexal masses. Other: There is a small amount of free fluid in the pelvis which is nonspecific. No free air. Musculoskeletal: No acute or significant osseous findings. IMPRESSION: 1. There is a small amount of free fluid in the pelvis which is nonspecific and of uncertain etiology. 2. Probable tiny cysts in the kidneys, too small to characterize. 3. Calcified atherosclerotic change at the origin of the renal arteries, left greater than right. 4. No other abnormalities. Electronically Signed   By: Dorise Bullion III M.D   On: 06/15/2021 19:56    Procedures Procedures   Medications Ordered in ED Medications  iohexol (OMNIPAQUE) 350 MG/ML injection 80 mL (80 mLs Intravenous Contrast Given 06/15/21 1854)    ED Course  I have reviewed the triage vital signs and the nursing notes.  Pertinent labs & imaging results that were available during my care of the patient were reviewed by me and considered in my medical decision making (see chart for details).    MDM Rules/Calculators/A&P                           Patient presents with abdominal pain.  She is overall  nontoxic and vital signs are notable for blood pressure of 188/101.  Pain is been ongoing for the last week.  She states that it is sharp.  She has some mild tenderness on exam  but no lateralizing pain and no signs of peritonitis.  Considerations include but not limited to, UTI, kidney stone, appendicitis, colitis although patient denies any change in bowel.  Labs obtained.  No leukocytosis.  No significant metabolic derangements.  She is slightly hyperglycemic but no evidence of DKA.  Urinalysis without evidence of urinary tract infection.  Patient has had a hysterectomy.  Pain does not lateralize so have lower suspicion for ovarian pathology.  Given ongoing symptoms, CT was obtained and CT scan is nondiagnostic.  She has a small amount of free fluid in her abdomen, some tiny renal cysts and some arthrosclerotic change.  None of which explain her symptoms.  She is remained comfortable in emergency department.  She is tolerating fluids.  Do not feel that any of these explain her symptoms.  Given that she is well-appearing and pain seems fairly well controlled, will discharge home.  Recommend Tylenol and close follow-up with PCP.  She was given strict return precautions.  After history, exam, and medical workup I feel the patient has been appropriately medically screened and is safe for discharge home. Pertinent diagnoses were discussed with the patient. Patient was given return precautions.  Final Clinical Impression(s) / ED Diagnoses Final diagnoses:  Lower abdominal pain    Rx / DC Orders ED Discharge Orders     None        Merryl Hacker, MD 06/15/21 2027

## 2021-06-15 NOTE — Discharge Instructions (Addendum)
You were seen today for abdominal pain.  The cause of your pain at this time is unclear.  Take Tylenol as needed.  If you develop nausea, vomiting, worsening pain, you should be reevaluated.

## 2021-06-15 NOTE — ED Triage Notes (Signed)
Complains of lower abdominal pain x1 week, denies N/V/D. BM last night.

## 2021-06-20 ENCOUNTER — Institutional Professional Consult (permissible substitution): Payer: Medicare HMO | Admitting: Neurology

## 2021-06-23 ENCOUNTER — Emergency Department (HOSPITAL_COMMUNITY): Payer: Medicare Other

## 2021-06-23 ENCOUNTER — Emergency Department (HOSPITAL_COMMUNITY)
Admission: EM | Admit: 2021-06-23 | Discharge: 2021-06-23 | Disposition: A | Discharge status: Home / Self Care | Payer: Medicare Other | Attending: Emergency Medicine | Admitting: Emergency Medicine

## 2021-06-23 ENCOUNTER — Other Ambulatory Visit: Payer: Self-pay

## 2021-06-23 ENCOUNTER — Encounter (HOSPITAL_COMMUNITY): Payer: Self-pay

## 2021-06-23 DIAGNOSIS — Z7982 Long term (current) use of aspirin: Secondary | ICD-10-CM | POA: Diagnosis not present

## 2021-06-23 DIAGNOSIS — Z87891 Personal history of nicotine dependence: Secondary | ICD-10-CM | POA: Insufficient documentation

## 2021-06-23 DIAGNOSIS — I10 Essential (primary) hypertension: Secondary | ICD-10-CM | POA: Diagnosis not present

## 2021-06-23 DIAGNOSIS — E1122 Type 2 diabetes mellitus with diabetic chronic kidney disease: Secondary | ICD-10-CM | POA: Diagnosis not present

## 2021-06-23 DIAGNOSIS — R319 Hematuria, unspecified: Secondary | ICD-10-CM | POA: Diagnosis present

## 2021-06-23 DIAGNOSIS — N1339 Other hydronephrosis: Secondary | ICD-10-CM | POA: Diagnosis not present

## 2021-06-23 DIAGNOSIS — Z794 Long term (current) use of insulin: Secondary | ICD-10-CM | POA: Diagnosis not present

## 2021-06-23 DIAGNOSIS — I129 Hypertensive chronic kidney disease with stage 1 through stage 4 chronic kidney disease, or unspecified chronic kidney disease: Secondary | ICD-10-CM | POA: Diagnosis not present

## 2021-06-23 DIAGNOSIS — N183 Chronic kidney disease, stage 3 unspecified: Secondary | ICD-10-CM | POA: Diagnosis not present

## 2021-06-23 DIAGNOSIS — R310 Gross hematuria: Secondary | ICD-10-CM | POA: Diagnosis not present

## 2021-06-23 DIAGNOSIS — N201 Calculus of ureter: Secondary | ICD-10-CM | POA: Diagnosis not present

## 2021-06-23 DIAGNOSIS — Z79899 Other long term (current) drug therapy: Secondary | ICD-10-CM | POA: Insufficient documentation

## 2021-06-23 DIAGNOSIS — N1330 Unspecified hydronephrosis: Secondary | ICD-10-CM | POA: Diagnosis not present

## 2021-06-23 LAB — URINALYSIS, ROUTINE W REFLEX MICROSCOPIC
Bilirubin Urine: NEGATIVE
Glucose, UA: NEGATIVE mg/dL
Ketones, ur: NEGATIVE mg/dL
Nitrite: NEGATIVE
Protein, ur: 100 mg/dL — AB
Specific Gravity, Urine: 1.025 (ref 1.005–1.030)
pH: 6.5 (ref 5.0–8.0)

## 2021-06-23 LAB — CBC
HCT: 31.8 % — ABNORMAL LOW (ref 36.0–46.0)
Hemoglobin: 10.1 g/dL — ABNORMAL LOW (ref 12.0–15.0)
MCH: 29.7 pg (ref 26.0–34.0)
MCHC: 31.8 g/dL (ref 30.0–36.0)
MCV: 93.5 fL (ref 80.0–100.0)
Platelets: 273 10*3/uL (ref 150–400)
RBC: 3.4 MIL/uL — ABNORMAL LOW (ref 3.87–5.11)
RDW: 12.7 % (ref 11.5–15.5)
WBC: 5.7 10*3/uL (ref 4.0–10.5)
nRBC: 0 % (ref 0.0–0.2)

## 2021-06-23 LAB — COMPREHENSIVE METABOLIC PANEL
ALT: 18 U/L (ref 0–44)
AST: 24 U/L (ref 15–41)
Albumin: 3.3 g/dL — ABNORMAL LOW (ref 3.5–5.0)
Alkaline Phosphatase: 129 U/L — ABNORMAL HIGH (ref 38–126)
Anion gap: 7 (ref 5–15)
BUN: 21 mg/dL (ref 8–23)
CO2: 24 mmol/L (ref 22–32)
Calcium: 9.1 mg/dL (ref 8.9–10.3)
Chloride: 111 mmol/L (ref 98–111)
Creatinine, Ser: 1.28 mg/dL — ABNORMAL HIGH (ref 0.44–1.00)
GFR, Estimated: 46 mL/min — ABNORMAL LOW (ref >60)
Glucose, Bld: 97 mg/dL (ref 70–99)
Potassium: 3.9 mmol/L (ref 3.5–5.1)
Sodium: 142 mmol/L (ref 135–145)
Total Bilirubin: 0.5 mg/dL (ref 0.3–1.2)
Total Protein: 7.3 g/dL (ref 6.5–8.1)

## 2021-06-23 LAB — URINALYSIS, MICROSCOPIC (REFLEX)
Bacteria, UA: NONE SEEN
RBC / HPF: >50 RBC/hpf (ref 0–5)

## 2021-06-23 LAB — LIPASE, BLOOD: Lipase: 55 U/L — ABNORMAL HIGH (ref 11–51)

## 2021-06-23 MED ORDER — KETOROLAC TROMETHAMINE 30 MG/ML IJ SOLN
30.0000 mg | Freq: Once | INTRAMUSCULAR | Status: AC
  Administered 2021-06-23: 30 mg via INTRAVENOUS
  Filled 2021-06-23: qty 1

## 2021-06-23 MED ORDER — ONDANSETRON HCL 4 MG/2ML IJ SOLN
4.0000 mg | Freq: Once | INTRAMUSCULAR | Status: AC
  Administered 2021-06-23: 4 mg via INTRAVENOUS
  Filled 2021-06-23: qty 2

## 2021-06-23 MED ORDER — TAMSULOSIN HCL 0.4 MG PO CAPS: 0.4000 mg | ORAL_CAPSULE | Freq: Every day | ORAL | 0 refills | Status: DC

## 2021-06-23 MED ORDER — ONDANSETRON 4 MG PO TBDP: ORAL_TABLET | ORAL | 0 refills | Status: DC

## 2021-06-23 MED ORDER — TRAMADOL HCL 50 MG PO TABS: 50.0000 mg | ORAL_TABLET | Freq: Four times a day (QID) | ORAL | 0 refills | Status: DC | PRN

## 2021-06-23 MED ORDER — HYDRALAZINE HCL 20 MG/ML IJ SOLN
10.0000 mg | Freq: Once | INTRAMUSCULAR | Status: AC
  Administered 2021-06-23: 10 mg via INTRAVENOUS
  Filled 2021-06-23: qty 1

## 2021-06-23 MED ORDER — HYDROMORPHONE HCL 1 MG/ML IJ SOLN
1.0000 mg | Freq: Once | INTRAMUSCULAR | Status: AC
  Administered 2021-06-23: 1 mg via INTRAVENOUS
  Filled 2021-06-23: qty 1

## 2021-06-23 MED ORDER — SODIUM CHLORIDE 0.9 % IV BOLUS
1000.0000 mL | Freq: Once | INTRAVENOUS | Status: AC
  Administered 2021-06-23: 1000 mL via INTRAVENOUS

## 2021-06-23 NOTE — ED Triage Notes (Signed)
Patient complains of left side pain with radiation to flank since am. Reports hematuria with same. Denis hx of stones and denies nausea.' vomiting

## 2021-06-23 NOTE — Discharge Instructions (Addendum)
Stay hydrated.  Take Flomax.  Take Ultram for pain and Zofran for nausea  See urology for follow-up  Take your blood pressure medicine as prescribed   Return to ER if you have worse flank pain, unable to urinate, large clots in your urine

## 2021-06-23 NOTE — ED Provider Notes (Signed)
[facility] EMERGENCY DEPARTMENT Provider Note   CSN: [id number] Arrival date & time: 06/23/21  1355     History No chief complaint on file.   Tricia Huynh is a 65 y.o. female hx of DM, HTN, seizure, here with hematuria, abdominal pain.  Patient states that she had a cute onset of hematuria earlier today.  Patient states that she has a history of kidney stones.  She states that upon arrival to the ED, she had sudden onset of severe left flank pain.  Denies any chest pain.  Patient was noted to be hypertensive but has a history of hypertension.  Patient is actively vomiting in the room.  The history is provided by the patient.      Past Medical History:  Diagnosis Date   Diabetes mellitus type 2 in nonobese ([place]) 06/06/2015   Diabetes mellitus without complication ([place])    Diabetic neuropathy ([place]) 06/06/2015   Dyslipidemia 06/06/2015   Encephalopathy    Essential hypertension 06/06/2015   Hypertension    Mood disorder ([place]) 06/06/2015   Pneumonia 03/11/2017   Seizure ([place])    sees [name]. abd eeg, on keppra    Patient Active Problem List   Diagnosis Date Noted   Colitis 12/15/2020   HCAP (healthcare-associated pneumonia) 03/11/2017   Dyslipidemia 03/11/2017   Diabetes mellitus with complication ([place])    Chest pain 12/30/2016   Seizure disorder ([place]) 12/30/2016   Diabetic ketoacidosis without coma associated with type 2 diabetes mellitus ([place])    DKA (diabetic ketoacidoses) 11/15/2016   UTI (urinary tract infection) 11/15/2016   Acute cystitis without hematuria    Acute renal failure (HCC)    Hyperglycemia 11/14/2016   Diabetes ([place]) 06/05/2016   Altered mental status    CKD (chronic kidney disease) stage 3, GFR 30-59 ml/min ([place]) 05/26/2016   Benign essential HTN 05/26/2016   Seizures ([place]) 01/08/2016   Memory loss 10/04/2015   Acute encephalopathy 06/06/2015   Diabetic neuropathy ([place]) 06/06/2015    Past Surgical History:  Procedure  Laterality Date   ABDOMINAL HYSTERECTOMY     EYE SURGERY Bilateral    TUBAL LIGATION       OB History   No obstetric history on file.     Family History  Problem Relation Age of Onset   Heart disease Father    Kidney disease Father    Diabetes Mother    Seizures Sister    Seizures Brother     Social History   Tobacco Use   Smoking status: Former    Types: Cigarettes   Smokeless tobacco: Never  Vaping Use   Vaping Use: Never used  Substance Use Topics   Alcohol use: No   Drug use: No    Home Medications Prior to Admission medications   Medication Sig Start Date End Date Taking? Authorizing Provider  Accu-Chek Softclix Lancets lancets 1 each by Other route 2 (two) times daily. E11.9 12/20/19   Renato Shin, MD  acetaminophen (TYLENOL) 500 MG tablet Take 1,000 mg by mouth every 6 (six) hours as needed for mild pain or headache.    [provider]  amitriptyline (ELAVIL) 50 MG tablet Take 50 mg by mouth daily. 05/08/15   [provider]  amLODipine-olmesartan (AZOR) 5-40 MG tablet Take 1 tablet by mouth daily. 10/08/16   [provider]  aspirin EC 81 MG tablet Take 81 mg by mouth daily.    [provider]  atorvastatin (LIPITOR) 40 MG tablet Take 40  mg by mouth daily. 12/23/19   [provider]  Blood Glucose Monitoring Suppl (ACCU-CHEK AVIVA PLUS) w/Device KIT 1 each by Does not apply route 2 (two) times daily. E11.9 12/20/19   Renato Shin, MD  gabapentin (NEURONTIN) 100 MG capsule Take 100 mg by mouth 3 (three) times daily. 12/23/19   [provider]  glucose blood (ACCU-CHEK AVIVA PLUS) test strip 1 each by Other route 2 (two) times daily. E11.9 12/20/19   Renato Shin, MD  hydrocortisone (ANUSOL-HC) 2.5 % rectal cream Apply 1 application topically 2 (two) times daily. 03/25/21   [provider]  Insulin Glargine (LANTUS SOLOSTAR) 100 UNIT/ML Solostar Pen Inject 24 Units into the skin every morning. And pen needles  1/day Patient taking differently: Inject 20 Units into the skin 2 (two) times daily. 12/20/19   Renato Shin, MD  levETIRAcetam (KEPPRA) 500 MG tablet Take 1 tablet (500 mg total) by mouth 2 (two) times daily. 02/10/21 05/30/21  Fatima Blank, MD  ondansetron (ZOFRAN ODT) 4 MG disintegrating tablet Take 1 tablet (4 mg total) by mouth every 8 (eight) hours as needed for nausea or vomiting. Patient not taking: No sig reported 01/17/21   Charmaine Downs C, PA-C  ondansetron (ZOFRAN) 4 MG tablet Take 1 tablet (4 mg total) by mouth every 8 (eight) hours as needed for nausea or vomiting. Patient not taking: No sig reported 12/19/20   Shelly Coss, MD  sertraline (ZOLOFT) 50 MG tablet Take 50 mg by mouth daily. 01/07/19   [provider]    Allergies    Percocet [oxycodone-acetaminophen] and Vicodin [hydrocodone-acetaminophen]  Review of Systems   Review of Systems  Gastrointestinal:  Positive for abdominal pain and vomiting.  Genitourinary:  Positive for flank pain and hematuria.  All other systems reviewed and are negative.  Physical Exam Updated Vital Signs BP 140/71   Pulse 89   Temp 99 F (37.2 C) (Oral)   Resp 14   Ht _0  (1.702 m)   Wt 59 kg   SpO2 99%   BMI 20.36 kg/m   Physical Exam Vitals and nursing note reviewed.  Constitutional:      Comments: Uncomfortable and vomiting  HENT:     Head: Normocephalic.     Nose: Nose normal.     Mouth/Throat:     Mouth: Mucous membranes are dry.  Eyes:     Extraocular Movements: Extraocular movements intact.     Pupils: Pupils are equal, round, and reactive to light.  Cardiovascular:     Rate and Rhythm: Normal rate and regular rhythm.     Pulses: Normal pulses.     Heart sounds: Normal heart sounds.  Pulmonary:     Effort: Pulmonary effort is normal.     Breath sounds: Normal breath sounds.  Abdominal:     General: Abdomen is flat.     Palpations: Abdomen is soft.     Comments: Mild left CVA tenderness and  left lower quadrant tenderness   Musculoskeletal:        General: Normal range of motion.     Cervical back: Normal range of motion and neck supple.     Comments: Good peripheral pulses   Skin:    General: Skin is warm.     Capillary Refill: Capillary refill takes less than 2 seconds.  Neurological:     General: No focal deficit present.     Mental Status: She is oriented to person, place, and time.  Psychiatric:  Mood and Affect: Mood normal.        Behavior: Behavior normal.    ED Results / Procedures / Treatments   Labs (all labs ordered are listed, but only abnormal results are displayed) Labs Reviewed  LIPASE, BLOOD - Abnormal; Notable for the following components:      Result Value   Lipase 55 (*)    All other components within normal limits  COMPREHENSIVE METABOLIC PANEL - Abnormal; Notable for the following components:   Creatinine, Ser 1.28 (*)    Albumin 3.3 (*)    Alkaline Phosphatase 129 (*)    GFR, Estimated 46 (*)    All other components within normal limits  CBC - Abnormal; Notable for the following components:   RBC 3.40 (*)    Hemoglobin 10.1 (*)    HCT 31.8 (*)    All other components within normal limits  URINALYSIS, ROUTINE W REFLEX MICROSCOPIC - Abnormal; Notable for the following components:   Color, Urine RED (*)    APPearance CLOUDY (*)    Hgb urine dipstick LARGE (*)    Protein, ur 100 (*)    Leukocytes,Ua SMALL (*)    All other components within normal limits  URINALYSIS, MICROSCOPIC (REFLEX) - Abnormal; Notable for the following components:   Trichomonas, UA PRESENT (*)    All other components within normal limits    EKG None  Radiology CT Renal Stone Study  Result Date: 06/23/2021 CLINICAL DATA:  Flank pain, kidney stone suspected Hematuria, unknown cause EXAM: CT ABDOMEN AND PELVIS WITHOUT CONTRAST TECHNIQUE: Multidetector CT imaging of the abdomen and pelvis was performed following the standard protocol without IV contrast.  COMPARISON:  06/15/2021 FINDINGS: Lower chest: No acute abnormality. Hepatobiliary: No focal hepatic abnormality. Gallbladder unremarkable. Pancreas: No focal abnormality or ductal dilatation. Spleen: No focal abnormality.  Normal size. Adrenals/Urinary Tract: There is mild left hydronephrosis and perinephric stranding. High-density material is seen in several areas within the left ureter which does not appear to be calcified stones. No stones were seen in the left kidney on the prior recent study. This could reflect left clot/products. Exact cause of the hydronephrosis not visualized. No stones or hydronephrosis on the right. Adrenal glands and urinary bladder unremarkable. Stomach/Bowel: Stomach, large and small bowel grossly unremarkable. Normal appendix. Vascular/Lymphatic: No evidence of aneurysm or adenopathy. Reproductive: Prior hysterectomy.  No adnexal masses. Other: No free fluid or free air. Musculoskeletal: No acute bony abnormality. IMPRESSION: Mild left hydronephrosis and perinephric stranding. High-density material seen within the left ureter in several areas. This does not appear to represent calcified stones and may reflect blood clots/products. Exact cause of the hydronephrosis not visualized. Electronically Signed   By: Rolm Baptise M.D.   On: 06/23/2021 17:16    Procedures Procedures    Medications Ordered in ED Medications  HYDROmorphone (DILAUDID) injection 1 mg (1 mg Intravenous Given 06/23/21 1545)  ondansetron (ZOFRAN) injection 4 mg (4 mg Intravenous Given 06/23/21 1544)  hydrALAZINE (APRESOLINE) injection 10 mg (10 mg Intravenous Given 06/23/21 1546)  hydrALAZINE (APRESOLINE) injection 10 mg (10 mg Intravenous Given 06/23/21 1755)  sodium chloride 0.9 % bolus 1,000 mL (1,000 mLs Intravenous Bolus 06/23/21 1845)  ketorolac (TORADOL) 30 MG/ML injection 30 mg (30 mg Intravenous Given 06/23/21 1845)    ED Course  I have reviewed the triage vital signs and the nursing notes.  Pertinent  labs & imaging results that were available during my care of the patient were reviewed by me and considered in my medical  decision making (see chart for details).    MDM Rules/Calculators/A&P                          Kimorah Ridolfi is a 65 y.o. female here presenting with left flank pain and hematuria.  Concern for possible kidney stones.  Patient has a history of kidney stone.  She appears to be writhing around in pain.  She is hypertensive but history of hypertension.  Plan to get CT renal stone and CBC and CMP and urinalysis  7:54 PM CT showed left hydro but there is no obvious stone in the ureter. There is seem to be blood clots in the ureter.  Patient is able to urinate and urinalysis showed blood and infection.  Patient was hypertensive initially and was given hydralazine and her blood pressure is now normal.  Her pain is also under control now. Will discharge patient home with flomax, pain meds, urology follow up     Final Clinical Impression(s) / ED Diagnoses Final diagnoses:  None    Rx / DC Orders ED Discharge Orders     None        Drenda Freeze, MD 06/23/21 [phone]

## 2021-06-23 NOTE — ED Notes (Signed)
Pt ambulated to restroom with assistance.  

## 2021-06-26 DIAGNOSIS — Z794 Long term (current) use of insulin: Secondary | ICD-10-CM | POA: Diagnosis not present

## 2021-06-26 DIAGNOSIS — R109 Unspecified abdominal pain: Secondary | ICD-10-CM | POA: Diagnosis not present

## 2021-06-26 DIAGNOSIS — R319 Hematuria, unspecified: Secondary | ICD-10-CM | POA: Diagnosis not present

## 2021-06-26 DIAGNOSIS — I10 Essential (primary) hypertension: Secondary | ICD-10-CM | POA: Diagnosis not present

## 2021-06-26 DIAGNOSIS — E1140 Type 2 diabetes mellitus with diabetic neuropathy, unspecified: Secondary | ICD-10-CM | POA: Diagnosis not present

## 2021-07-05 ENCOUNTER — Other Ambulatory Visit: Payer: Self-pay

## 2021-07-05 ENCOUNTER — Encounter (HOSPITAL_COMMUNITY): Payer: Self-pay

## 2021-07-05 ENCOUNTER — Emergency Department (HOSPITAL_COMMUNITY)
Admission: EM | Admit: 2021-07-05 | Discharge: 2021-07-05 | Disposition: A | Discharge status: Home / Self Care | Payer: Medicare Other | Attending: Emergency Medicine | Admitting: Emergency Medicine

## 2021-07-05 DIAGNOSIS — I129 Hypertensive chronic kidney disease with stage 1 through stage 4 chronic kidney disease, or unspecified chronic kidney disease: Secondary | ICD-10-CM | POA: Insufficient documentation

## 2021-07-05 DIAGNOSIS — Z794 Long term (current) use of insulin: Secondary | ICD-10-CM | POA: Diagnosis not present

## 2021-07-05 DIAGNOSIS — R109 Unspecified abdominal pain: Secondary | ICD-10-CM | POA: Insufficient documentation

## 2021-07-05 DIAGNOSIS — N183 Chronic kidney disease, stage 3 unspecified: Secondary | ICD-10-CM | POA: Diagnosis not present

## 2021-07-05 DIAGNOSIS — E1140 Type 2 diabetes mellitus with diabetic neuropathy, unspecified: Secondary | ICD-10-CM | POA: Insufficient documentation

## 2021-07-05 DIAGNOSIS — R5381 Other malaise: Secondary | ICD-10-CM | POA: Diagnosis not present

## 2021-07-05 DIAGNOSIS — E1122 Type 2 diabetes mellitus with diabetic chronic kidney disease: Secondary | ICD-10-CM | POA: Insufficient documentation

## 2021-07-05 DIAGNOSIS — Z79899 Other long term (current) drug therapy: Secondary | ICD-10-CM | POA: Insufficient documentation

## 2021-07-05 DIAGNOSIS — R509 Fever, unspecified: Secondary | ICD-10-CM | POA: Diagnosis not present

## 2021-07-05 DIAGNOSIS — R519 Headache, unspecified: Secondary | ICD-10-CM | POA: Insufficient documentation

## 2021-07-05 DIAGNOSIS — R112 Nausea with vomiting, unspecified: Secondary | ICD-10-CM | POA: Insufficient documentation

## 2021-07-05 DIAGNOSIS — R5383 Other fatigue: Secondary | ICD-10-CM | POA: Insufficient documentation

## 2021-07-05 DIAGNOSIS — Z20822 Contact with and (suspected) exposure to covid-19: Secondary | ICD-10-CM | POA: Diagnosis not present

## 2021-07-05 DIAGNOSIS — Z87891 Personal history of nicotine dependence: Secondary | ICD-10-CM | POA: Insufficient documentation

## 2021-07-05 DIAGNOSIS — Z7982 Long term (current) use of aspirin: Secondary | ICD-10-CM | POA: Insufficient documentation

## 2021-07-05 LAB — CBC
HCT: 32.7 % — ABNORMAL LOW (ref 36.0–46.0)
Hemoglobin: 10.6 g/dL — ABNORMAL LOW (ref 12.0–15.0)
MCH: 29.7 pg (ref 26.0–34.0)
MCHC: 32.4 g/dL (ref 30.0–36.0)
MCV: 91.6 fL (ref 80.0–100.0)
Platelets: 265 10*3/uL (ref 150–400)
RBC: 3.57 MIL/uL — ABNORMAL LOW (ref 3.87–5.11)
RDW: 12.4 % (ref 11.5–15.5)
WBC: 3.8 10*3/uL — ABNORMAL LOW (ref 4.0–10.5)
nRBC: 0 % (ref 0.0–0.2)

## 2021-07-05 LAB — URINALYSIS, ROUTINE W REFLEX MICROSCOPIC
Bilirubin Urine: NEGATIVE
Glucose, UA: NEGATIVE mg/dL
Ketones, ur: 20 mg/dL — AB
Nitrite: NEGATIVE
Protein, ur: >=300 mg/dL — AB
Specific Gravity, Urine: 1.018 (ref 1.005–1.030)
pH: 5 (ref 5.0–8.0)

## 2021-07-05 LAB — COMPREHENSIVE METABOLIC PANEL
ALT: 11 U/L (ref 0–44)
AST: 19 U/L (ref 15–41)
Albumin: 3.5 g/dL (ref 3.5–5.0)
Alkaline Phosphatase: 105 U/L (ref 38–126)
Anion gap: 13 (ref 5–15)
BUN: 35 mg/dL — ABNORMAL HIGH (ref 8–23)
CO2: 23 mmol/L (ref 22–32)
Calcium: 9.1 mg/dL (ref 8.9–10.3)
Chloride: 105 mmol/L (ref 98–111)
Creatinine, Ser: 1.45 mg/dL — ABNORMAL HIGH (ref 0.44–1.00)
GFR, Estimated: 40 mL/min — ABNORMAL LOW (ref >60)
Glucose, Bld: 105 mg/dL — ABNORMAL HIGH (ref 70–99)
Potassium: 3.9 mmol/L (ref 3.5–5.1)
Sodium: 141 mmol/L (ref 135–145)
Total Bilirubin: 0.9 mg/dL (ref 0.3–1.2)
Total Protein: 7.9 g/dL (ref 6.5–8.1)

## 2021-07-05 LAB — RESP PANEL BY RT-PCR (FLU A&B, COVID) ARPGX2
Influenza A by PCR: NEGATIVE
Influenza B by PCR: NEGATIVE
SARS Coronavirus 2 by RT PCR: NEGATIVE

## 2021-07-05 LAB — LIPASE, BLOOD: Lipase: 36 U/L (ref 11–51)

## 2021-07-05 MED ORDER — SODIUM CHLORIDE 0.9 % IV BOLUS
1000.0000 mL | Freq: Once | INTRAVENOUS | Status: AC
  Administered 2021-07-05: 1000 mL via INTRAVENOUS

## 2021-07-05 MED ORDER — SODIUM CHLORIDE 0.9 % IV SOLN
12.5000 mg | Freq: Once | INTRAVENOUS | Status: AC
  Administered 2021-07-05: 12.5 mg via INTRAVENOUS
  Filled 2021-07-05: qty 12.5

## 2021-07-05 MED ORDER — PROMETHAZINE HCL 25 MG PO TABS: 25.0000 mg | ORAL_TABLET | Freq: Three times a day (TID) | ORAL | 0 refills | Status: DC | PRN

## 2021-07-05 NOTE — ED Provider Notes (Signed)
[facility] DEPT Provider Note   CSN: [id number] Arrival date & time: 07/05/21  1622     History Chief Complaint  Patient presents with   Abdominal Pain    Tricia Huynh is a 65 y.o. female with history of diabetes, chronic abdominal pain, presenting for department generalized fatigue and abdominal pain.  Patient reports history grandchildren sick in a house with a GI bug.  She herself began having symptoms 5 days ago with nausea, vomiting, loose bowel movements.  She reports objective fevers and chills at home, but she does report that she was running a temperature yesterday.  She reports a headache and malaise.  She reports her throat.  She has had COVID-vaccine and a booster.  Reports abdominal pain is cramping and intermittent.  He does not feel exactly like prior abdominal pain episode she has had been seen in the ED.  She had multiple CT scans of her belly for recurring abdominal pain, without clear source for her pain.  HPI     Past Medical History:  Diagnosis Date   Diabetes mellitus type 2 in nonobese ([place]) 06/06/2015   Diabetes mellitus without complication ([place])    Diabetic neuropathy ([place]) 06/06/2015   Dyslipidemia 06/06/2015   Encephalopathy    Essential hypertension 06/06/2015   Hypertension    Mood disorder ([place]) 06/06/2015   Pneumonia 03/11/2017   Seizure ([place])    sees [name]. abd eeg, on keppra    Patient Active Problem List   Diagnosis Date Noted   Colitis 12/15/2020   HCAP (healthcare-associated pneumonia) 03/11/2017   Dyslipidemia 03/11/2017   Diabetes mellitus with complication ([place])    Chest pain 12/30/2016   Seizure disorder ([place]) 12/30/2016   Diabetic ketoacidosis without coma associated with type 2 diabetes mellitus ([place])    DKA (diabetic ketoacidoses) 11/15/2016   UTI (urinary tract infection) 11/15/2016   Acute cystitis without hematuria    Acute renal failure (HCC)    Hyperglycemia 11/14/2016   Diabetes  ([place]) 06/05/2016   Altered mental status    CKD (chronic kidney disease) stage 3, GFR 30-59 ml/min ([place]) 05/26/2016   Benign essential HTN 05/26/2016   Seizures ([place]) 01/08/2016   Memory loss 10/04/2015   Acute encephalopathy 06/06/2015   Diabetic neuropathy ([place]) 06/06/2015    Past Surgical History:  Procedure Laterality Date   ABDOMINAL HYSTERECTOMY     EYE SURGERY Bilateral    TUBAL LIGATION       OB History   No obstetric history on file.     Family History  Problem Relation Age of Onset   Heart disease Father    Kidney disease Father    Diabetes Mother    Seizures Sister    Seizures Brother     Social History   Tobacco Use   Smoking status: Former    Types: Cigarettes   Smokeless tobacco: Never  Vaping Use   Vaping Use: Never used  Substance Use Topics   Alcohol use: No   Drug use: No    Home Medications Prior to Admission medications   Medication Sig Start Date End Date Taking? Authorizing Provider  promethazine (PHENERGAN) 25 MG tablet Take 1 tablet (25 mg total) by mouth every 8 (eight) hours as needed for up to 6 doses for refractory nausea / vomiting. 07/05/21  Yes Karna Abed, Carola Rhine, MD  Accu-Chek Softclix Lancets lancets 1 each by Other route 2 (two) times daily. E11.9 12/20/19   Renato Shin, MD  acetaminophen (TYLENOL) 500  MG tablet Take 1,000 mg by mouth every 6 (six) hours as needed for mild pain or headache.    [provider]  amitriptyline (ELAVIL) 50 MG tablet Take 50 mg by mouth daily. 05/08/15   [provider]  amLODipine-olmesartan (AZOR) 5-40 MG tablet Take 1 tablet by mouth daily. 10/08/16   [provider]  aspirin EC 81 MG tablet Take 81 mg by mouth daily.    [provider]  atorvastatin (LIPITOR) 40 MG tablet Take 40 mg by mouth daily. 12/23/19   [provider]  Blood Glucose Monitoring Suppl (ACCU-CHEK AVIVA PLUS) w/Device KIT 1 each by Does not apply route 2 (two) times daily. E11.9 12/20/19    Renato Shin, MD  gabapentin (NEURONTIN) 100 MG capsule Take 100 mg by mouth 3 (three) times daily. 12/23/19   [provider]  glucose blood (ACCU-CHEK AVIVA PLUS) test strip 1 each by Other route 2 (two) times daily. E11.9 12/20/19   Renato Shin, MD  hydrocortisone (ANUSOL-HC) 2.5 % rectal cream Apply 1 application topically 2 (two) times daily. 03/25/21   [provider]  Insulin Glargine (LANTUS SOLOSTAR) 100 UNIT/ML Solostar Pen Inject 24 Units into the skin every morning. And pen needles 1/day Patient taking differently: Inject 20 Units into the skin 2 (two) times daily. 12/20/19   Renato Shin, MD  levETIRAcetam (KEPPRA) 500 MG tablet Take 1 tablet (500 mg total) by mouth 2 (two) times daily. 02/10/21 05/30/21  Fatima Blank, MD  ondansetron (ZOFRAN ODT) 4 MG disintegrating tablet 75m ODT q4 hours prn nausea/vomit 06/23/21   YDrenda Freeze MD  ondansetron (ZOFRAN) 4 MG tablet Take 1 tablet (4 mg total) by mouth every 8 (eight) hours as needed for nausea or vomiting. Patient not taking: No sig reported 12/19/20   AShelly Coss MD  sertraline (ZOLOFT) 50 MG tablet Take 50 mg by mouth daily. 01/07/19   [provider]  tamsulosin (FLOMAX) 0.4 MG CAPS capsule Take 1 capsule (0.4 mg total) by mouth daily. 06/23/21   YDrenda Freeze MD  traMADol (ULTRAM) 50 MG tablet Take 1 tablet (50 mg total) by mouth every 6 (six) hours as needed for moderate pain. 06/23/21   YDrenda Freeze MD    Allergies    Percocet [oxycodone-acetaminophen] and Vicodin [hydrocodone-acetaminophen]  Review of Systems   Review of Systems  Constitutional:  Positive for chills, fatigue and fever.  HENT:  Positive for congestion and sore throat.   Eyes:  Negative for pain and visual disturbance.  Respiratory:  Negative for cough and shortness of breath.   Cardiovascular:  Negative for chest pain and palpitations.  Gastrointestinal:  Positive for abdominal pain, diarrhea, nausea and  vomiting.  Genitourinary:  Negative for dysuria and hematuria.  Musculoskeletal:  Negative for arthralgias and back pain.  Skin:  Negative for color change and rash.  Neurological:  Positive for headaches. Negative for syncope.  All other systems reviewed and are negative.  Physical Exam Updated Vital Signs BP (!) 183/96   Pulse 77   Temp 99.4 F (37.4 C) (Oral)   Resp 16   SpO2 99%   Physical Exam Constitutional:      General: She is not in acute distress. HENT:     Head: Normocephalic and atraumatic.  Eyes:     Conjunctiva/sclera: Conjunctivae normal.     Pupils: Pupils are equal, round, and reactive to light.  Cardiovascular:     Rate and Rhythm: Normal rate and regular rhythm.  Pulmonary:     Effort: Pulmonary effort is normal. No respiratory distress.  Abdominal:     General: There is no distension.     Tenderness: There is no abdominal tenderness.  Skin:    General: Skin is warm and dry.  Neurological:     General: No focal deficit present.     Mental Status: She is alert and oriented to person, place, and time. Mental status is at baseline.  Psychiatric:        Mood and Affect: Mood normal.        Behavior: Behavior normal.    ED Results / Procedures / Treatments   Labs (all labs ordered are listed, but only abnormal results are displayed) Labs Reviewed  COMPREHENSIVE METABOLIC PANEL - Abnormal; Notable for the following components:      Result Value   Glucose, Bld 105 (*)    BUN 35 (*)    Creatinine, Ser 1.45 (*)    GFR, Estimated 40 (*)    All other components within normal limits  CBC - Abnormal; Notable for the following components:   WBC 3.8 (*)    RBC 3.57 (*)    Hemoglobin 10.6 (*)    HCT 32.7 (*)    All other components within normal limits  URINALYSIS, ROUTINE W REFLEX MICROSCOPIC - Abnormal; Notable for the following components:   APPearance HAZY (*)    Hgb urine dipstick SMALL (*)    Ketones, ur 20 (*)    Protein, ur >=300 (*)     Leukocytes,Ua TRACE (*)    Bacteria, UA RARE (*)    All other components within normal limits  RESP PANEL BY RT-PCR (FLU A&B, COVID) ARPGX2  LIPASE, BLOOD    EKG None  Radiology No results found.  Procedures Procedures   Medications Ordered in ED Medications  sodium chloride 0.9 % bolus 1,000 mL (0 mLs Intravenous Stopped 07/05/21 2352)  promethazine (PHENERGAN) 12.5 mg in sodium chloride 0.9 % 50 mL IVPB (0 mg Intravenous Stopped 07/05/21 2300)    ED Course  I have reviewed the triage vital signs and the nursing notes.  Pertinent labs & imaging results that were available during my care of the patient were reviewed by me and considered in my medical decision making (see chart for details).  Ddx includes viral gastroenteritis vs covid 19 vs other  With her history of multiple sick contacts at home, I suspect this is a viral illness.  She has benign abdominal exam.  Have a very low suspicion for acute appendicitis, colitis, other abdominal surgical emergency.  She has had numerous unremarkable CT scans in the past for abdominal pain, does unfortunately suffer from chronic abdominal pain.    But her exam is not consistent with a UTI, kidney stone.  No clear evidence of infection in the urine, nitrates are negative.  She is a very minor elevation both BUN and creatinine suggestive of some dehydration.  A liter of fluids were ordered.  LFTs are normal.  Phenergan was also ordered for nausea.  Minor leukocytosis.  Clinically I doubt this is sepsis or meningitis.  We will order a COVID test and will follow up on the results tomorrow.  She is noted to be hypertensive today and has been in the past.  This may be related to her nausea.  I advised that she monitor her blood pressure at home.   Dierra Riesgo was evaluated in Emergency Department on 07/06/2021 for the symptoms described in the  history of present illness. She was evaluated in the context of the global COVID-19  pandemic, which necessitated consideration that the patient might be at risk for infection with the SARS-CoV-2 virus that causes COVID-19. Institutional protocols and algorithms that pertain to the evaluation of patients at risk for COVID-19 are in a state of rapid change based on information released by regulatory bodies including the CDC and federal and state organizations. These policies and algorithms were followed during the patient's care in the ED.     Final Clinical Impression(s) / ED Diagnoses Final diagnoses:  Abdominal pain, unspecified abdominal location  Non-intractable vomiting with nausea, unspecified vomiting type    Rx / DC Orders ED Discharge Orders          Ordered    promethazine (PHENERGAN) 25 MG tablet  Every 8 hours PRN        07/05/21 2227             Wyvonnia Dusky, MD 07/06/21 [phone]

## 2021-07-05 NOTE — ED Provider Notes (Signed)
Emergency Medicine Provider Triage Evaluation Note  Tricia Huynh , a 65 y.o. female  was evaluated in triage.  Pt complains of ongoing right-sided abdominal pain and flank pain.  Patient was seen in the emergency department 06/23/21 for abdominal pain and hematuria and had CT imaging as noted below.  She has had multiple other CT scans of her abdomen this year.  She reports intermittent nausea and vomiting.  She reports intermittent fever but no fever today.  No chest pain or shortness of breath.  CT 06/23/21:  Mild left hydronephrosis and perinephric stranding. High-density material seen within the left ureter in several areas. This does not appear to represent calcified stones and may reflect blood clots/products. Exact cause of the hydronephrosis not visualized.  Review of Systems  Positive: Abdominal pain, vomiting Negative: Fever  Physical Exam  BP (!) 179/115 (BP Location: Right Arm)   Pulse 92   Temp 99.4 F (37.4 C) (Oral)   Resp 18   SpO2 92%  Gen:   Awake, no distress   Resp:  Normal effort  MSK:   Moves extremities without difficulty  Abd:   Right-sided abdominal pain without rebound or guarding Other:    Medical Decision Making  Medically screening exam initiated at 6:28 PM.  Appropriate orders placed.  [name] was informed that the remainder of the evaluation will be completed by another provider, this initial triage assessment does not replace that evaluation, and the importance of remaining in the ED until their evaluation is complete.     Carlisle Cater, PA-C 07/05/21 1831    Wyvonnia Dusky, MD 07/05/21 1919

## 2021-07-05 NOTE — ED Triage Notes (Signed)
Pt reports right sided abdominal pain, fatigue, and loss of appetite for a few days. Denies N/V/D.

## 2021-07-05 NOTE — Discharge Instructions (Addendum)
Your blood work and exam was reassuring today.  I felt your symptoms were consistent with a virus.  This is also more likely because you have multiple family members were sick with a stomach bug.  We tested you for COVID, your result will be available by tomorrow morning.  If you test positive, you should continue quarantining for the next 5 days.  Please make an effort to drink plenty of water at home.  We gave you some IV fluids in the ER.  We also gave you some nausea medicine which may help, and I prescribed you a few tablets to use at home.  If you are not feeling better next week, or if your COVID test is negative and you still feel badly, please call and schedule follow-up appointment with your primary care doctor early next week.  If you are not able to keep down any fluids, have worsening vomiting, worsening belly pain, please come back to the emergency department.

## 2021-07-18 ENCOUNTER — Institutional Professional Consult (permissible substitution): Payer: Medicare HMO | Admitting: Neurology

## 2021-07-18 ENCOUNTER — Encounter: Payer: Self-pay | Admitting: Neurology

## 2021-07-24 ENCOUNTER — Emergency Department (HOSPITAL_COMMUNITY)
Admission: EM | Admit: 2021-07-24 | Discharge: 2021-07-24 | Disposition: A | Discharge status: Home / Self Care | Payer: Medicare Other | Attending: Emergency Medicine | Admitting: Emergency Medicine

## 2021-07-24 ENCOUNTER — Emergency Department (HOSPITAL_COMMUNITY): Payer: Medicare Other

## 2021-07-24 ENCOUNTER — Other Ambulatory Visit: Payer: Self-pay

## 2021-07-24 DIAGNOSIS — Z79899 Other long term (current) drug therapy: Secondary | ICD-10-CM | POA: Insufficient documentation

## 2021-07-24 DIAGNOSIS — R519 Headache, unspecified: Secondary | ICD-10-CM | POA: Diagnosis not present

## 2021-07-24 DIAGNOSIS — I10 Essential (primary) hypertension: Secondary | ICD-10-CM

## 2021-07-24 DIAGNOSIS — N183 Chronic kidney disease, stage 3 unspecified: Secondary | ICD-10-CM | POA: Insufficient documentation

## 2021-07-24 DIAGNOSIS — R112 Nausea with vomiting, unspecified: Secondary | ICD-10-CM | POA: Diagnosis not present

## 2021-07-24 DIAGNOSIS — E1122 Type 2 diabetes mellitus with diabetic chronic kidney disease: Secondary | ICD-10-CM | POA: Diagnosis not present

## 2021-07-24 DIAGNOSIS — I129 Hypertensive chronic kidney disease with stage 1 through stage 4 chronic kidney disease, or unspecified chronic kidney disease: Secondary | ICD-10-CM | POA: Diagnosis not present

## 2021-07-24 DIAGNOSIS — Z7982 Long term (current) use of aspirin: Secondary | ICD-10-CM | POA: Insufficient documentation

## 2021-07-24 DIAGNOSIS — Z794 Long term (current) use of insulin: Secondary | ICD-10-CM | POA: Diagnosis not present

## 2021-07-24 DIAGNOSIS — Z87891 Personal history of nicotine dependence: Secondary | ICD-10-CM | POA: Diagnosis not present

## 2021-07-24 LAB — CBC WITH DIFFERENTIAL/PLATELET
Abs Immature Granulocytes: 0.02 10*3/uL (ref 0.00–0.07)
Basophils Absolute: 0 10*3/uL (ref 0.0–0.1)
Basophils Relative: 0 %
Eosinophils Absolute: 0 10*3/uL (ref 0.0–0.5)
Eosinophils Relative: 0 %
HCT: 34.4 % — ABNORMAL LOW (ref 36.0–46.0)
Hemoglobin: 11.3 g/dL — ABNORMAL LOW (ref 12.0–15.0)
Immature Granulocytes: 1 %
Lymphocytes Relative: 29 %
Lymphs Abs: 1.2 10*3/uL (ref 0.7–4.0)
MCH: 29.6 pg (ref 26.0–34.0)
MCHC: 32.8 g/dL (ref 30.0–36.0)
MCV: 90.1 fL (ref 80.0–100.0)
Monocytes Absolute: 0.3 10*3/uL (ref 0.1–1.0)
Monocytes Relative: 6 %
Neutro Abs: 2.7 10*3/uL (ref 1.7–7.7)
Neutrophils Relative %: 64 %
Platelets: 217 10*3/uL (ref 150–400)
RBC: 3.82 MIL/uL — ABNORMAL LOW (ref 3.87–5.11)
RDW: 12.6 % (ref 11.5–15.5)
WBC: 4.1 10*3/uL (ref 4.0–10.5)
nRBC: 0 % (ref 0.0–0.2)

## 2021-07-24 LAB — COMPREHENSIVE METABOLIC PANEL
ALT: 12 U/L (ref 0–44)
AST: 17 U/L (ref 15–41)
Albumin: 3.4 g/dL — ABNORMAL LOW (ref 3.5–5.0)
Alkaline Phosphatase: 116 U/L (ref 38–126)
Anion gap: 9 (ref 5–15)
BUN: 12 mg/dL (ref 8–23)
CO2: 26 mmol/L (ref 22–32)
Calcium: 9.4 mg/dL (ref 8.9–10.3)
Chloride: 103 mmol/L (ref 98–111)
Creatinine, Ser: 1.06 mg/dL — ABNORMAL HIGH (ref 0.44–1.00)
GFR, Estimated: 58 mL/min — ABNORMAL LOW (ref >60)
Glucose, Bld: 203 mg/dL — ABNORMAL HIGH (ref 70–99)
Potassium: 3.2 mmol/L — ABNORMAL LOW (ref 3.5–5.1)
Sodium: 138 mmol/L (ref 135–145)
Total Bilirubin: 0.6 mg/dL (ref 0.3–1.2)
Total Protein: 7.6 g/dL (ref 6.5–8.1)

## 2021-07-24 MED ORDER — ONDANSETRON HCL 4 MG PO TABS: 4.0000 mg | ORAL_TABLET | Freq: Four times a day (QID) | ORAL | 0 refills | Status: DC

## 2021-07-24 MED ORDER — CLONIDINE HCL 0.1 MG PO TABS
0.1000 mg | ORAL_TABLET | Freq: Once | ORAL | Status: AC
  Administered 2021-07-24: 0.1 mg via ORAL
  Filled 2021-07-24: qty 1

## 2021-07-24 MED ORDER — ONDANSETRON 4 MG PO TBDP
4.0000 mg | ORAL_TABLET | Freq: Once | ORAL | Status: AC
  Administered 2021-07-24: 4 mg via ORAL
  Filled 2021-07-24: qty 1

## 2021-07-24 MED ORDER — PROMETHAZINE HCL 25 MG RE SUPP: 25.0000 mg | Freq: Four times a day (QID) | RECTAL | 0 refills | Status: DC | PRN

## 2021-07-24 NOTE — Discharge Instructions (Addendum)
Return for any problem.  ?

## 2021-07-24 NOTE — ED Provider Notes (Signed)
[facility] EMERGENCY DEPARTMENT Provider Note   CSN: [id number] Arrival date & time: 07/24/21  1159     History No chief complaint on file.   Tricia Huynh is a 65 y.o. female.  65 year old female with prior medical history as detailed below presents for evaluation.  Patient complains of intermittent nausea with associated vomiting.  This is been an ongoing issue for several weeks.  Patient reports that her blood pressure was elevated today so she came into the ED for evaluation.  She reports that she was having trouble taking her regular medications secondary to her persistent nausea and vomiting.  Her last dose of any antihypertensive was yesterday evening.  She attempted to take medications this morning but then threw them up.  She denies associated chest pain or shortness of breath.  She denies abdominal pain.  She denies recent fever.  The history is provided by the patient.  Emesis Severity:  Mild Duration:  3 weeks Timing:  Constant Progression:  Unchanged Chronicity:  New Recent urination:  Normal Relieved by:  Nothing Worsened by:  Nothing     Past Medical History:  Diagnosis Date   Diabetes mellitus type 2 in nonobese ([place]) 06/06/2015   Diabetes mellitus without complication ([place])    Diabetic neuropathy ([place]) 06/06/2015   Dyslipidemia 06/06/2015   Encephalopathy    Essential hypertension 06/06/2015   Hypertension    Mood disorder ([place]) 06/06/2015   Pneumonia 03/11/2017   Seizure ([place])    sees [name]. abd eeg, on keppra    Patient Active Problem List   Diagnosis Date Noted   Colitis 12/15/2020   HCAP (healthcare-associated pneumonia) 03/11/2017   Dyslipidemia 03/11/2017   Diabetes mellitus with complication ([place])    Chest pain 12/30/2016   Seizure disorder ([place]) 12/30/2016   Diabetic ketoacidosis without coma associated with type 2 diabetes mellitus ([place])    DKA (diabetic ketoacidoses) 11/15/2016   UTI (urinary tract infection)  11/15/2016   Acute cystitis without hematuria    Acute renal failure (HCC)    Hyperglycemia 11/14/2016   Diabetes ([place]) 06/05/2016   Altered mental status    CKD (chronic kidney disease) stage 3, GFR 30-59 ml/min ([place]) 05/26/2016   Benign essential HTN 05/26/2016   Seizures ([place]) 01/08/2016   Memory loss 10/04/2015   Acute encephalopathy 06/06/2015   Diabetic neuropathy ([place]) 06/06/2015    Past Surgical History:  Procedure Laterality Date   ABDOMINAL HYSTERECTOMY     EYE SURGERY Bilateral    TUBAL LIGATION       OB History   No obstetric history on file.     Family History  Problem Relation Age of Onset   Heart disease Father    Kidney disease Father    Diabetes Mother    Seizures Sister    Seizures Brother     Social History   Tobacco Use   Smoking status: Former    Types: Cigarettes   Smokeless tobacco: Never  Vaping Use   Vaping Use: Never used  Substance Use Topics   Alcohol use: No   Drug use: No    Home Medications Prior to Admission medications   Medication Sig Start Date End Date Taking? Authorizing Provider  Accu-Chek Softclix Lancets lancets 1 each by Other route 2 (two) times daily. E11.9 12/20/19   Renato Shin, MD  acetaminophen (TYLENOL) 500 MG tablet Take 1,000 mg by mouth every 6 (six) hours as needed for mild pain or headache.    [provider]  amitriptyline (ELAVIL) 50 MG tablet Take 50 mg by mouth daily. 05/08/15   [provider]  amLODipine-olmesartan (AZOR) 5-40 MG tablet Take 1 tablet by mouth daily. 10/08/16   [provider]  aspirin EC 81 MG tablet Take 81 mg by mouth daily.    [provider]  atorvastatin (LIPITOR) 40 MG tablet Take 40 mg by mouth daily. 12/23/19   [provider]  Blood Glucose Monitoring Suppl (ACCU-CHEK AVIVA PLUS) w/Device KIT 1 each by Does not apply route 2 (two) times daily. E11.9 12/20/19   Renato Shin, MD  gabapentin (NEURONTIN) 100 MG capsule Take 100 mg by mouth  3 (three) times daily. 12/23/19   [provider]  glucose blood (ACCU-CHEK AVIVA PLUS) test strip 1 each by Other route 2 (two) times daily. E11.9 12/20/19   Renato Shin, MD  hydrocortisone (ANUSOL-HC) 2.5 % rectal cream Apply 1 application topically 2 (two) times daily. 03/25/21   [provider]  Insulin Glargine (LANTUS SOLOSTAR) 100 UNIT/ML Solostar Pen Inject 24 Units into the skin every morning. And pen needles 1/day Patient taking differently: Inject 20 Units into the skin 2 (two) times daily. 12/20/19   Renato Shin, MD  levETIRAcetam (KEPPRA) 500 MG tablet Take 1 tablet (500 mg total) by mouth 2 (two) times daily. 02/10/21 05/30/21  Fatima Blank, MD  ondansetron (ZOFRAN ODT) 4 MG disintegrating tablet 66m ODT q4 hours prn nausea/vomit 06/23/21   YDrenda Freeze MD  ondansetron (ZOFRAN) 4 MG tablet Take 1 tablet (4 mg total) by mouth every 8 (eight) hours as needed for nausea or vomiting. Patient not taking: No sig reported 12/19/20   AShelly Coss MD  promethazine (PHENERGAN) 25 MG tablet Take 1 tablet (25 mg total) by mouth every 8 (eight) hours as needed for up to 6 doses for refractory nausea / vomiting. 07/05/21   TWyvonnia Dusky MD  sertraline (ZOLOFT) 50 MG tablet Take 50 mg by mouth daily. 01/07/19   [provider]  tamsulosin (FLOMAX) 0.4 MG CAPS capsule Take 1 capsule (0.4 mg total) by mouth daily. 06/23/21   YDrenda Freeze MD  traMADol (ULTRAM) 50 MG tablet Take 1 tablet (50 mg total) by mouth every 6 (six) hours as needed for moderate pain. 06/23/21   YDrenda Freeze MD    Allergies    Percocet [oxycodone-acetaminophen] and Vicodin [hydrocodone-acetaminophen]  Review of Systems   Review of Systems  Gastrointestinal:  Positive for vomiting.  All other systems reviewed and are negative.  Physical Exam Updated Vital Signs BP (!) 201/127 (BP Location: Right Arm)   Pulse 95   Temp 98.7 F (37.1 C) (Oral)   Resp 14   SpO2 100%    Physical Exam Vitals and nursing note reviewed.  Constitutional:      General: She is not in acute distress.    Appearance: Normal appearance. She is well-developed.  HENT:     Head: Normocephalic and atraumatic.  Eyes:     Conjunctiva/sclera: Conjunctivae normal.     Pupils: Pupils are equal, round, and reactive to light.  Cardiovascular:     Rate and Rhythm: Normal rate and regular rhythm.     Heart sounds: Normal heart sounds.  Pulmonary:     Effort: Pulmonary effort is normal. No respiratory distress.     Breath sounds: Normal breath sounds.  Abdominal:     General: There is no distension.     Palpations: Abdomen is soft.  Tenderness: There is no abdominal tenderness.  Musculoskeletal:        General: No deformity. Normal range of motion.     Cervical back: Normal range of motion and neck supple.  Skin:    General: Skin is warm and dry.  Neurological:     General: No focal deficit present.     Mental Status: She is alert and oriented to person, place, and time.    ED Results / Procedures / Treatments   Labs (all labs ordered are listed, but only abnormal results are displayed) Labs Reviewed  COMPREHENSIVE METABOLIC PANEL - Abnormal; Notable for the following components:      Result Value   Potassium 3.2 (*)    Glucose, Bld 203 (*)    Creatinine, Ser 1.06 (*)    Albumin 3.4 (*)    GFR, Estimated 58 (*)    All other components within normal limits  CBC WITH DIFFERENTIAL/PLATELET - Abnormal; Notable for the following components:   RBC 3.82 (*)    Hemoglobin 11.3 (*)    HCT 34.4 (*)    All other components within normal limits  URINALYSIS, ROUTINE W REFLEX MICROSCOPIC    EKG EKG Interpretation  Date/Time:  Wednesday July 24 2021 14:07:37 EDT Ventricular Rate:  115 PR Interval:  152 QRS Duration: 70 QT Interval:  338 QTC Calculation: 467 R Axis:   0 Text Interpretation: Sinus tachycardia Right atrial enlargement Nonspecific ST abnormality  Abnormal ECG Confirmed by Dene Gentry [phone]) on 07/24/2021 3:05:17 PM  Radiology No results found.  Procedures Procedures   Medications Ordered in ED Medications  ondansetron (ZOFRAN-ODT) disintegrating tablet 4 mg (4 mg Oral Given 07/24/21 1529)  cloNIDine (CATAPRES) tablet 0.1 mg (0.1 mg Oral Given 07/24/21 1527)    ED Course  I have reviewed the triage vital signs and the nursing notes.  Pertinent labs & imaging results that were available during my care of the patient were reviewed by me and considered in my medical decision making (see chart for details).    MDM Rules/Calculators/A&P                           MDM  MSE Complete  Tricia Huynh was evaluated in Emergency Department on 07/24/2021 for the symptoms described in the history of present illness. She was evaluated in the context of the global COVID-19 pandemic, which necessitated consideration that the patient might be at risk for infection with the SARS-CoV-2 virus that causes COVID-19. Institutional protocols and algorithms that pertain to the evaluation of patients at risk for COVID-19 are in a state of rapid change based on information released by regulatory bodies including the CDC and federal and state organizations. These policies and algorithms were followed during the patient's care in the ED.   Patient is presenting with complaint of elevated BP.  This is most likely secondary to patient's reported difficulty taking medication secondary to chronic nausea and associated vomiting.  Screening labs obtained are without significant abnormality.  Patient's BP is improved with p.o. medications here in the ED.  Patient was able to take these without difficulty.  Patient is described nausea and vomiting may be secondary to possible gastroparesis.  Patient is encouraged to closely follow-up with the outpatient setting.  Importance of close follow-up is stressed.  Strict return precautions given and  understood.   Final Clinical Impression(s) / ED Diagnoses Final diagnoses:  Hypertension, unspecified type  Nausea and vomiting, intractability of  vomiting not specified, unspecified vomiting type    Rx / DC Orders ED Discharge Orders     None        Valarie Merino, MD 07/24/21 [phone]

## 2021-07-24 NOTE — ED Triage Notes (Signed)
Pt here from home with c/o htn , pt recently got  some meds added for her nth but has been unable to keep the meds dowm

## 2021-07-24 NOTE — ED Provider Notes (Signed)
Emergency Medicine Provider Triage Evaluation Note  Tricia Huynh , a 65 y.o. female  was evaluated in triage.  Pt complains of high blood pressure.  She has been unable to take her blood pressure medications for the last 2 weeks as she keeps vomiting them back up.  Review of Systems  Positive: Headache, Dizziness, generalized weakness Negative: Focal weakness, focal numbness, chest pain, shortness of breath, abdominal pain  Physical Exam  BP (!) 225/134 (BP Location: Right Arm)   Pulse (!) 130   Temp 98.7 F (37.1 C) (Oral)   Resp 18   SpO2 99%  Gen:   Awake, no distress, tearful  Resp:  Normal effort, clear to auscultation bilaterally MSK:   Moves extremities without difficulty  Other:  Cranial nerves II through XII are intact, 5/5 strength and good sensation to the upper and lower extremities.  Medical Decision Making  Medically screening exam initiated at 1:59 PM.  Appropriate orders placed.  Tricia Huynh was informed that the remainder of the evaluation will be completed by another provider, this initial triage assessment does not replace that evaluation, and the importance of remaining in the ED until their evaluation is complete.     Tricia [place], PA-C 07/24/21 1409    Tricia Muskrat, MD 07/24/21 [phone]

## 2021-08-05 DIAGNOSIS — K644 Residual hemorrhoidal skin tags: Secondary | ICD-10-CM | POA: Diagnosis not present

## 2021-08-05 DIAGNOSIS — D509 Iron deficiency anemia, unspecified: Secondary | ICD-10-CM | POA: Diagnosis not present

## 2021-08-05 DIAGNOSIS — K2930 Chronic superficial gastritis without bleeding: Secondary | ICD-10-CM | POA: Diagnosis not present

## 2021-08-05 DIAGNOSIS — B9681 Helicobacter pylori [H. pylori] as the cause of diseases classified elsewhere: Secondary | ICD-10-CM | POA: Diagnosis not present

## 2021-08-05 DIAGNOSIS — K648 Other hemorrhoids: Secondary | ICD-10-CM | POA: Diagnosis not present

## 2021-08-05 DIAGNOSIS — D128 Benign neoplasm of rectum: Secondary | ICD-10-CM | POA: Diagnosis not present

## 2021-08-08 DIAGNOSIS — N130 Hydronephrosis with ureteropelvic junction obstruction: Secondary | ICD-10-CM | POA: Diagnosis not present

## 2021-08-08 DIAGNOSIS — R310 Gross hematuria: Secondary | ICD-10-CM | POA: Diagnosis not present

## 2021-08-09 DIAGNOSIS — K2930 Chronic superficial gastritis without bleeding: Secondary | ICD-10-CM | POA: Diagnosis not present

## 2021-08-09 DIAGNOSIS — B9681 Helicobacter pylori [H. pylori] as the cause of diseases classified elsewhere: Secondary | ICD-10-CM | POA: Diagnosis not present

## 2021-09-27 DIAGNOSIS — R310 Gross hematuria: Secondary | ICD-10-CM | POA: Diagnosis not present

## 2021-09-27 DIAGNOSIS — N2889 Other specified disorders of kidney and ureter: Secondary | ICD-10-CM | POA: Diagnosis not present

## 2021-10-07 DIAGNOSIS — R310 Gross hematuria: Secondary | ICD-10-CM | POA: Diagnosis not present

## 2021-10-07 DIAGNOSIS — N130 Hydronephrosis with ureteropelvic junction obstruction: Secondary | ICD-10-CM | POA: Diagnosis not present

## 2021-10-07 DIAGNOSIS — R8271 Bacteriuria: Secondary | ICD-10-CM | POA: Diagnosis not present

## 2021-10-12 IMAGING — CT CT ABD-PELV W/ CM
2 of 5 series · 16 of 46 positions shown, 18 images · IV contrast (omnipaque)
Comparison: January 17, 2021

CLINICAL DATA: Lower abdominal pain for a week. Bowel movement last
night. No nausea, vomiting, or diarrhea.

EXAM:
CT ABDOMEN AND PELVIS WITH CONTRAST
TECHNIQUE: Multidetector CT imaging of the abdomen and pelvis was performed
using the standard protocol following bolus administration of
intravenous contrast.
CONTRAST:  80mL OMNIPAQUE IOHEXOL 350 MG/ML SOLN

[Series 2: axial st · axial · 0.65mm/px · z∈[-841,-466]mm · 13 of 87 slices shown, 15 images]
[im 6/87  soft-tissue]
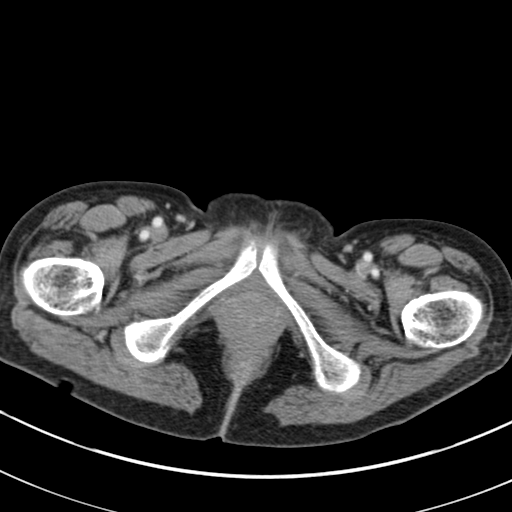
[im 6/87  bone]
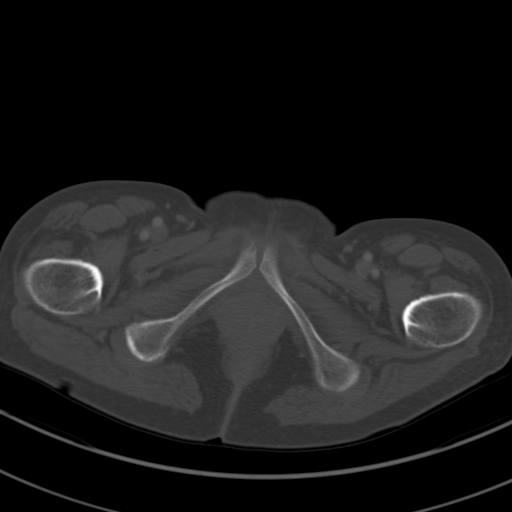
[im 11/87  soft-tissue]
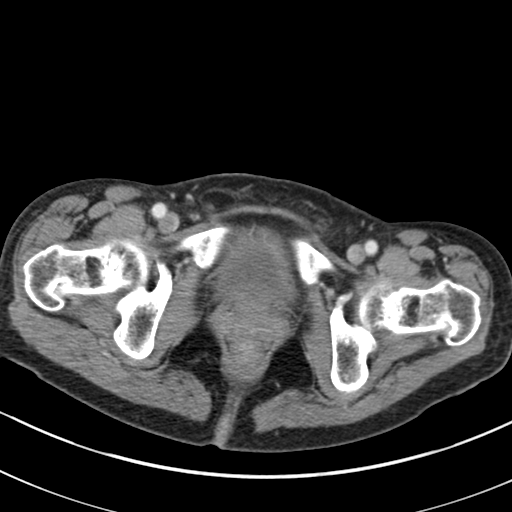
[im 17/87  soft-tissue]
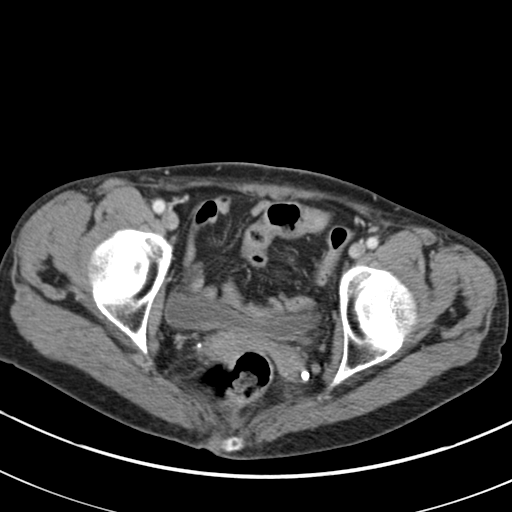
[im 27/87  soft-tissue]
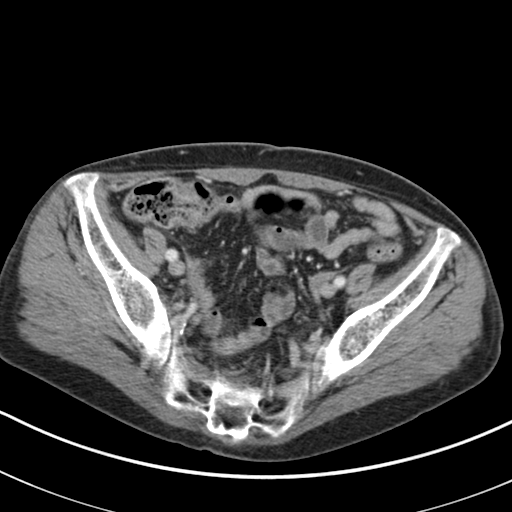
[im 33/87  soft-tissue]
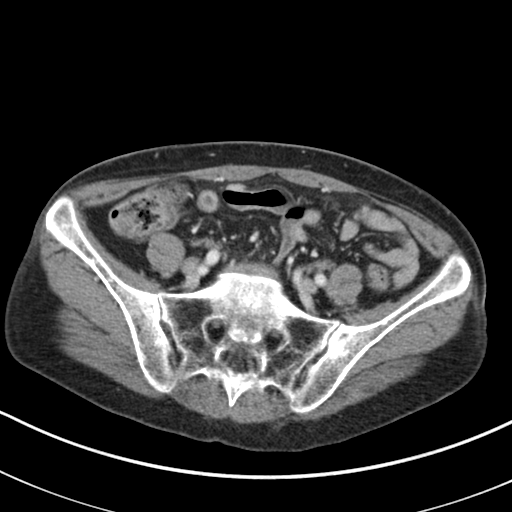
[im 38/87  soft-tissue]
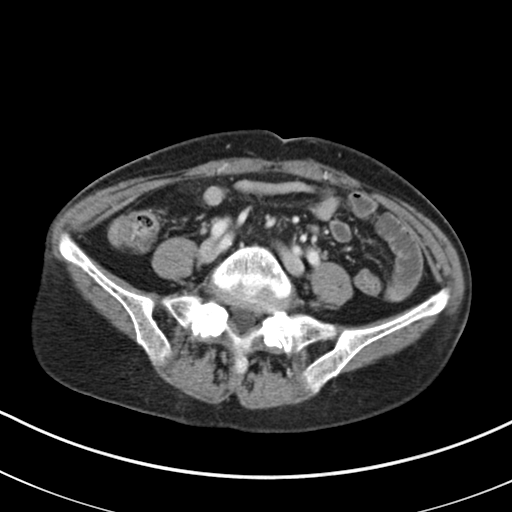
[im 44/87  soft-tissue]
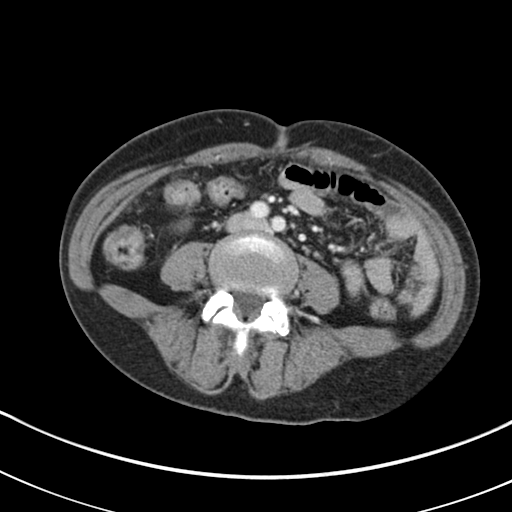
[im 49/87  soft-tissue]
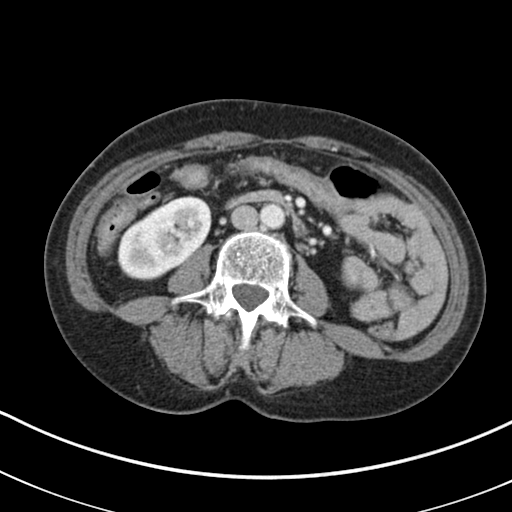
[im 54/87  soft-tissue]
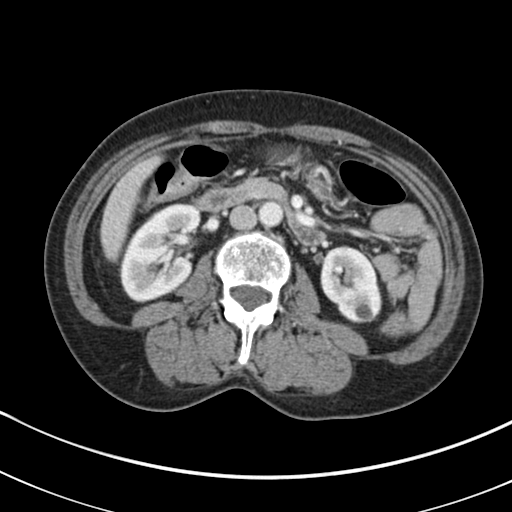
[im 54/87  bone]
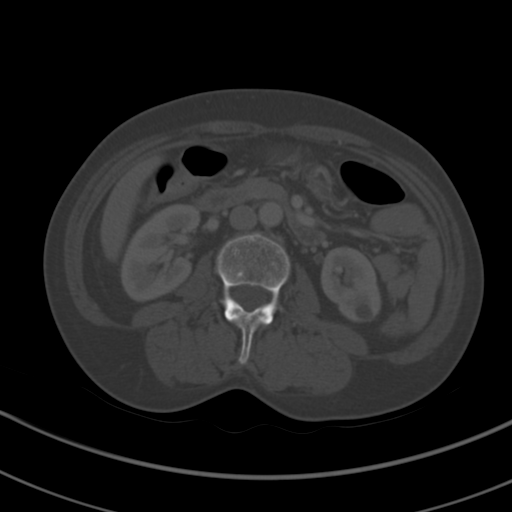
[im 60/87  soft-tissue]
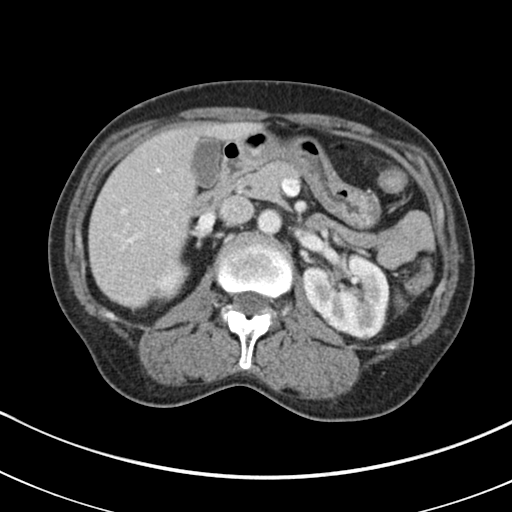
[im 70/87  soft-tissue]
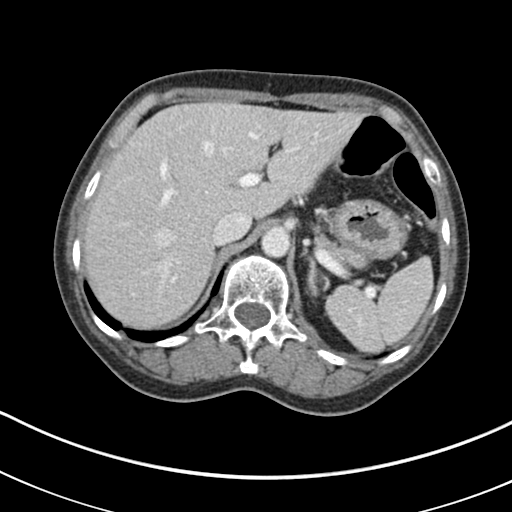
[im 76/87  soft-tissue]
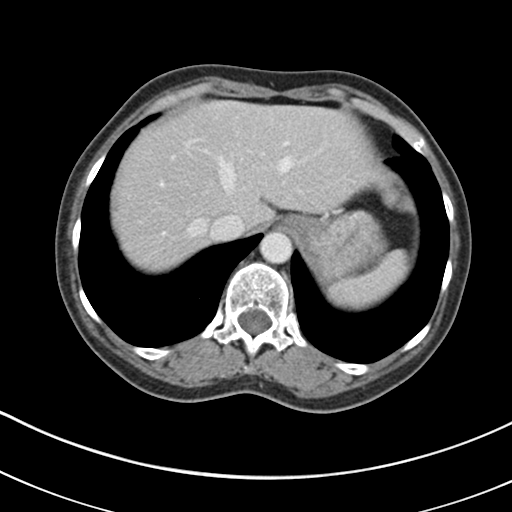
[im 81/87  soft-tissue]
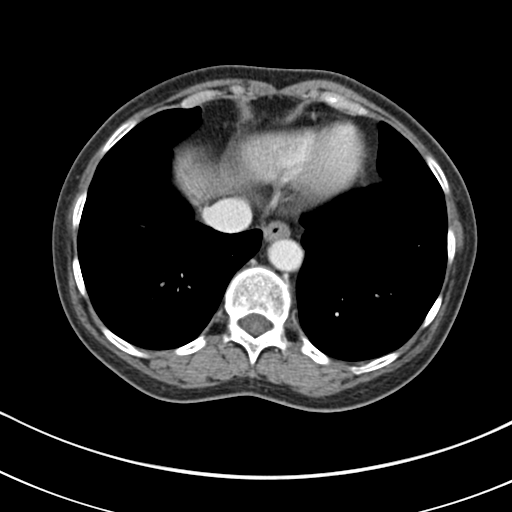

[Series 4: coronal st · coronal · 0.61mm/px · 3 of 134 slices shown]
[im 45/134  soft-tissue]
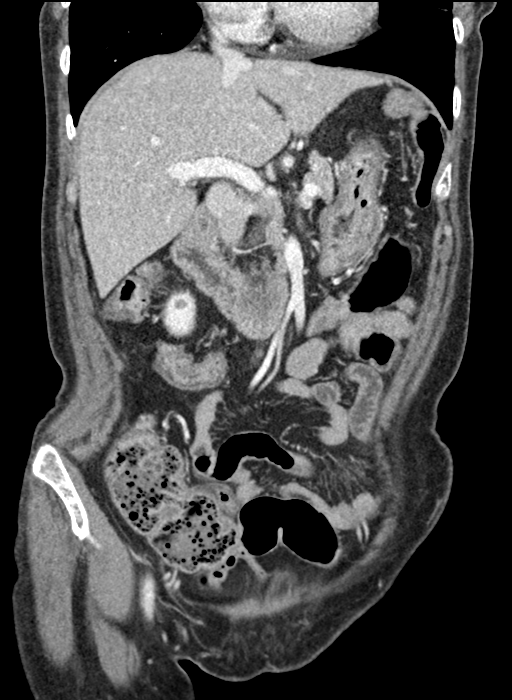
[im 60/134  soft-tissue]
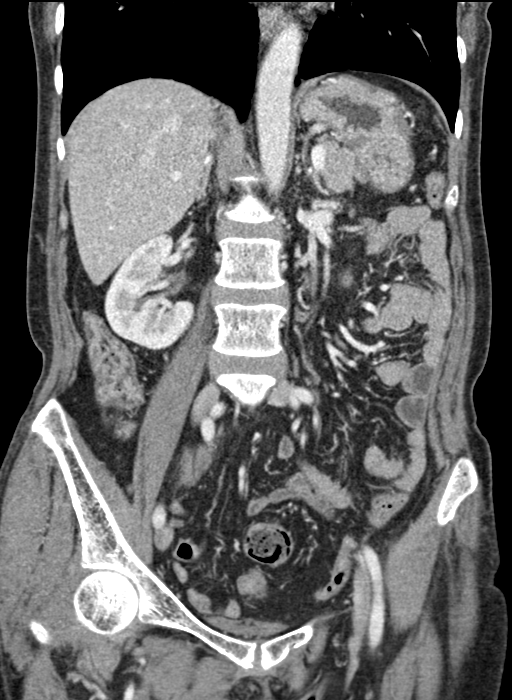
[im 74/134  soft-tissue]
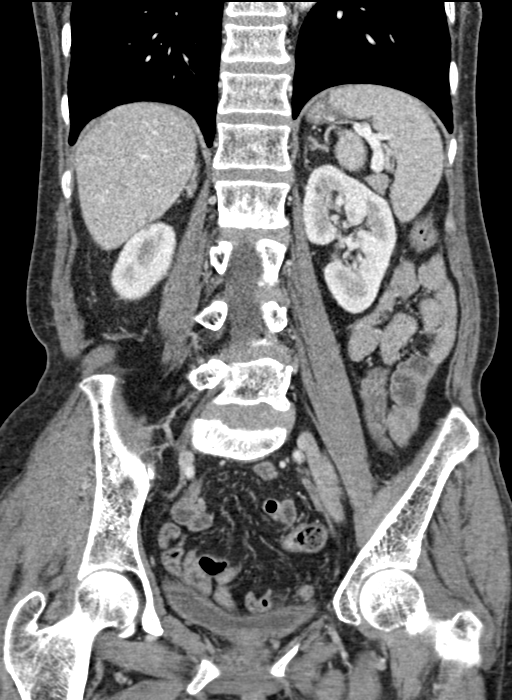

[16 of 46 positions shown; findings below may reference images not displayed]

FINDINGS: Lower chest: No acute abnormality.

Hepatobiliary: No focal liver abnormality is seen. No gallstones,
gallbladder wall thickening, or biliary dilatation.

Pancreas: Unremarkable. No pancreatic ductal dilatation or
surrounding inflammatory changes.

Spleen: Normal in size without focal abnormality.

Adrenals/Urinary Tract: Adrenal glands are normal. Tiny
low-attenuation lesions of both kidneys are too small to
characterize but almost certainly tiny cysts. No hydronephrosis or
perinephric stranding. The ureters are normal. The bladder is poorly
evaluated due to lack of distention but no abnormalities are seen.

Stomach/Bowel: The stomach is normal. No small bowel obstruction
identified. The colon is unremarkable. The appendix is normal.

Vascular/Lymphatic: The abdominal aorta is normal in caliber.
Calcified atherosclerotic change at the origin of both renal
arteries left greater than right. No aneurysm or dissection in the
abdominal aorta. No adenopathy.

Reproductive: Status post hysterectomy. No adnexal masses.

Other: There is a small amount of free fluid in the pelvis which is
nonspecific. No free air.

Musculoskeletal: No acute or significant osseous findings.
IMPRESSION: 1. There is a small amount of free fluid in the pelvis which is
nonspecific and of uncertain etiology.
2. Probable tiny cysts in the kidneys, too small to characterize.
3. Calcified atherosclerotic change at the origin of the renal
arteries, left greater than right.
4. No other abnormalities.

## 2021-11-04 DIAGNOSIS — K5909 Other constipation: Secondary | ICD-10-CM | POA: Diagnosis not present

## 2021-11-04 DIAGNOSIS — A048 Other specified bacterial intestinal infections: Secondary | ICD-10-CM | POA: Diagnosis not present

## 2021-11-04 DIAGNOSIS — R030 Elevated blood-pressure reading, without diagnosis of hypertension: Secondary | ICD-10-CM | POA: Diagnosis not present

## 2021-11-04 DIAGNOSIS — K6282 Dysplasia of anus: Secondary | ICD-10-CM | POA: Diagnosis not present

## 2021-11-05 DIAGNOSIS — I10 Essential (primary) hypertension: Secondary | ICD-10-CM | POA: Diagnosis not present

## 2021-11-08 DIAGNOSIS — E118 Type 2 diabetes mellitus with unspecified complications: Secondary | ICD-10-CM | POA: Diagnosis not present

## 2021-11-08 DIAGNOSIS — Z794 Long term (current) use of insulin: Secondary | ICD-10-CM | POA: Diagnosis not present

## 2021-11-13 ENCOUNTER — Telehealth: Payer: Self-pay | Admitting: Gastroenterology

## 2021-11-13 NOTE — Telephone Encounter (Signed)
Hi Dr. Rush Landmark, we have received a referral from Dr. Alessandra Bevels with [facility] for removal of large polyp. Records will be sent to you for review. Please advise on scheduling. Thank you.

## 2021-11-14 ENCOUNTER — Encounter: Payer: Self-pay | Admitting: Gastroenterology

## 2021-11-14 NOTE — Telephone Encounter (Signed)
Left message to call back to schedule consult.  

## 2021-11-14 NOTE — Telephone Encounter (Signed)
I received a packet referral from [facility] about a distal rectal polyp that was found back in September 2022.  The colonoscopy showed a fair prep and a 15 mm flat polypoid lesion in the distal rectum.  Biopsy showed AIN 1.  I have no pictures or the report.  I need to get color pictures to see if this is a lesion that I can remove versus may need a referral elsewhere to have an ESD performed (I do not perform ESD. Please work on obtaining this information and color photos as soon as possible. Thanks. GM

## 2021-11-14 NOTE — Telephone Encounter (Signed)
I will be out of the office starting 12/29 p.m.  She should be set up for a clinic visit with me in 2023, as I have briefly spoken to Dr. Alessandra Bevels about this patient.  I need to review the images and pathology to consider timing of a potential procedure.  Please ensure that the paperwork is on my desk so I can finalize a plan.  Thanks. GM

## 2021-11-14 NOTE — Telephone Encounter (Signed)
Patient has been scheduled for 12/20/21.

## 2021-11-19 NOTE — Telephone Encounter (Signed)
Tricia Huynh, I have looked at the pictures and reviewed the pathology.  In the setting of this being AIN (anal intraepithelial neoplasia) I think that this patient may get a more definitive attempt at en bloc resection with an ESD versus a transanal excision rather than piecemeal EMR that I would be able to offer. I would talk with her and offer her an opportunity to be referred for an ESD attempt at [facility] or [facility].  If she is set on trying to stay local I am certainly happy to talk with her, meet her, and perform a procedure with attempt at being able to remove this but again I think this may be better removed in a different technique because of how close it is to the hemorrhoid/dentate line based on imaging of pictures. If this is what she decides to move forward with then please let me know so I can let Dr. Alessandra Bevels know as well so that he can work on getting referrals placed. Thanks. GM

## 2021-11-19 NOTE — Telephone Encounter (Signed)
Patty, Thank you. I will review and reply back today. GM

## 2021-11-19 NOTE — Telephone Encounter (Signed)
Dr Rush Landmark here are the color pictures.  I have added them to the chart here and I will also email to you.

## 2021-11-20 ENCOUNTER — Other Ambulatory Visit: Payer: Self-pay

## 2021-11-20 ENCOUNTER — Emergency Department (HOSPITAL_COMMUNITY)
Admission: EM | Admit: 2021-11-20 | Discharge: 2021-11-21 | Disposition: A | Discharge status: Home / Self Care | Payer: 59 | Attending: Medical | Admitting: Medical

## 2021-11-20 DIAGNOSIS — Z5321 Procedure and treatment not carried out due to patient leaving prior to being seen by health care provider: Secondary | ICD-10-CM | POA: Diagnosis not present

## 2021-11-20 DIAGNOSIS — R55 Syncope and collapse: Secondary | ICD-10-CM | POA: Insufficient documentation

## 2021-11-20 DIAGNOSIS — G40909 Epilepsy, unspecified, not intractable, without status epilepticus: Secondary | ICD-10-CM | POA: Insufficient documentation

## 2021-11-20 DIAGNOSIS — R569 Unspecified convulsions: Secondary | ICD-10-CM | POA: Diagnosis present

## 2021-11-20 DIAGNOSIS — R519 Headache, unspecified: Secondary | ICD-10-CM | POA: Diagnosis not present

## 2021-11-20 LAB — CBC WITH DIFFERENTIAL/PLATELET
Abs Immature Granulocytes: 0.01 10*3/uL (ref 0.00–0.07)
Basophils Absolute: 0 10*3/uL (ref 0.0–0.1)
Basophils Relative: 0 %
Eosinophils Absolute: 0 10*3/uL (ref 0.0–0.5)
Eosinophils Relative: 0 %
HCT: 33 % — ABNORMAL LOW (ref 36.0–46.0)
Hemoglobin: 10.6 g/dL — ABNORMAL LOW (ref 12.0–15.0)
Immature Granulocytes: 0 %
Lymphocytes Relative: 31 %
Lymphs Abs: 1.7 10*3/uL (ref 0.7–4.0)
MCH: 29.7 pg (ref 26.0–34.0)
MCHC: 32.1 g/dL (ref 30.0–36.0)
MCV: 92.4 fL (ref 80.0–100.0)
Monocytes Absolute: 0.3 10*3/uL (ref 0.1–1.0)
Monocytes Relative: 6 %
Neutro Abs: 3.3 10*3/uL (ref 1.7–7.7)
Neutrophils Relative %: 63 %
Platelets: 271 10*3/uL (ref 150–400)
RBC: 3.57 MIL/uL — ABNORMAL LOW (ref 3.87–5.11)
RDW: 12.8 % (ref 11.5–15.5)
WBC: 5.4 10*3/uL (ref 4.0–10.5)
nRBC: 0 % (ref 0.0–0.2)

## 2021-11-20 LAB — URINALYSIS, ROUTINE W REFLEX MICROSCOPIC
Bilirubin Urine: NEGATIVE
Glucose, UA: 50 mg/dL — AB
Hgb urine dipstick: NEGATIVE
Ketones, ur: NEGATIVE mg/dL
Nitrite: NEGATIVE
Protein, ur: >=300 mg/dL — AB
Specific Gravity, Urine: 1.012 (ref 1.005–1.030)
pH: 7 (ref 5.0–8.0)

## 2021-11-20 LAB — RAPID URINE DRUG SCREEN, HOSP PERFORMED
Amphetamines: NOT DETECTED
Barbiturates: NOT DETECTED
Benzodiazepines: NOT DETECTED
Cocaine: NOT DETECTED
Opiates: NOT DETECTED
Tetrahydrocannabinol: POSITIVE — AB

## 2021-11-20 LAB — COMPREHENSIVE METABOLIC PANEL
ALT: 14 U/L (ref 0–44)
AST: 20 U/L (ref 15–41)
Albumin: 3.3 g/dL — ABNORMAL LOW (ref 3.5–5.0)
Alkaline Phosphatase: 136 U/L — ABNORMAL HIGH (ref 38–126)
Anion gap: 6 (ref 5–15)
BUN: 14 mg/dL (ref 8–23)
CO2: 25 mmol/L (ref 22–32)
Calcium: 9.3 mg/dL (ref 8.9–10.3)
Chloride: 108 mmol/L (ref 98–111)
Creatinine, Ser: 1.25 mg/dL — ABNORMAL HIGH (ref 0.44–1.00)
GFR, Estimated: 48 mL/min — ABNORMAL LOW (ref >60)
Glucose, Bld: 80 mg/dL (ref 70–99)
Potassium: 3.6 mmol/L (ref 3.5–5.1)
Sodium: 139 mmol/L (ref 135–145)
Total Bilirubin: 0.4 mg/dL (ref 0.3–1.2)
Total Protein: 7.5 g/dL (ref 6.5–8.1)

## 2021-11-20 LAB — ETHANOL: Alcohol, Ethyl (B): <10 mg/dL (ref <10)

## 2021-11-20 LAB — MAGNESIUM: Magnesium: 2 mg/dL (ref 1.7–2.4)

## 2021-11-20 NOTE — ED Notes (Signed)
Pt c/o neck pain, c-collar applied in triage. Pt also states she has been out of her seizure medication for approx 1 mo
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
## 2021-11-20 NOTE — Telephone Encounter (Signed)
Left message on machine for the pt to call back  Dr Rosalie Gums do you want to let the pt know Dr Donneta Romberg recommendations? I have tried to reach her but had to leave a message.
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
## 2021-11-20 NOTE — Telephone Encounter (Signed)
Patty, Thank you for trying to reach out to the patient.  At least she has a scheduled visit with me in case she does not call back where we can discuss things further. PB, as discussed I think it may be reasonable for her to have a evaluation where an ESD can be considered due to the location and so consideration of a [facility] gastroenterologist ([place]) or [facility] gastroenterologist Stephanie Acre) may be in her best interests if she wants an attempt at en bloc resection. Hopefully your office can reach out to her as well and work on trying to schedule there unless she really only wants to be seen in [place].  If that is the case, then the other question is whether transanal excision via colorectal surgery would be considered. Thanks. GM

## 2021-11-21 ENCOUNTER — Emergency Department (HOSPITAL_COMMUNITY): Payer: 59

## 2021-11-21 DIAGNOSIS — G40909 Epilepsy, unspecified, not intractable, without status epilepticus: Secondary | ICD-10-CM | POA: Diagnosis not present

## 2021-11-21 NOTE — ED Notes (Signed)
Pt left stickers in back of wheel chair and when called to see if she was still here, no response

## 2021-11-25 LAB — LEVETIRACETAM LEVEL: Levetiracetam Lvl: <2 ug/mL — ABNORMAL LOW (ref 10.0–40.0)

## 2021-12-16 ENCOUNTER — Ambulatory Visit: Payer: Self-pay | Admitting: Surgery

## 2021-12-16 ENCOUNTER — Encounter: Payer: Self-pay | Admitting: Surgery

## 2021-12-16 DIAGNOSIS — K6282 Dysplasia of anus: Secondary | ICD-10-CM | POA: Insufficient documentation

## 2021-12-16 DIAGNOSIS — K644 Residual hemorrhoidal skin tags: Secondary | ICD-10-CM | POA: Insufficient documentation

## 2021-12-20 ENCOUNTER — Ambulatory Visit: Payer: Medicare Other | Admitting: Gastroenterology

## 2022-01-21 ENCOUNTER — Ambulatory Visit: Payer: 59 | Admitting: Gastroenterology

## 2022-03-03 ENCOUNTER — Other Ambulatory Visit: Payer: Self-pay | Admitting: Family Medicine

## 2022-03-03 DIAGNOSIS — F17211 Nicotine dependence, cigarettes, in remission: Secondary | ICD-10-CM

## 2022-03-10 ENCOUNTER — Other Ambulatory Visit: Payer: Self-pay

## 2022-03-10 ENCOUNTER — Encounter (HOSPITAL_COMMUNITY): Payer: Self-pay

## 2022-03-10 ENCOUNTER — Emergency Department (HOSPITAL_COMMUNITY)
Admission: EM | Admit: 2022-03-10 | Discharge: 2022-03-11 | Disposition: A | Discharge status: Home / Self Care | Payer: 59 | Attending: Emergency Medicine | Admitting: Emergency Medicine

## 2022-03-10 DIAGNOSIS — Z7982 Long term (current) use of aspirin: Secondary | ICD-10-CM | POA: Insufficient documentation

## 2022-03-10 DIAGNOSIS — E1165 Type 2 diabetes mellitus with hyperglycemia: Secondary | ICD-10-CM | POA: Diagnosis not present

## 2022-03-10 DIAGNOSIS — Z794 Long term (current) use of insulin: Secondary | ICD-10-CM | POA: Diagnosis not present

## 2022-03-10 DIAGNOSIS — I10 Essential (primary) hypertension: Secondary | ICD-10-CM | POA: Insufficient documentation

## 2022-03-10 DIAGNOSIS — Z79899 Other long term (current) drug therapy: Secondary | ICD-10-CM | POA: Diagnosis not present

## 2022-03-10 DIAGNOSIS — R519 Headache, unspecified: Secondary | ICD-10-CM | POA: Diagnosis present

## 2022-03-10 DIAGNOSIS — R7989 Other specified abnormal findings of blood chemistry: Secondary | ICD-10-CM | POA: Insufficient documentation

## 2022-03-10 LAB — COMPREHENSIVE METABOLIC PANEL
ALT: 14 U/L (ref 0–44)
AST: 16 U/L (ref 15–41)
Albumin: 3.4 g/dL — ABNORMAL LOW (ref 3.5–5.0)
Alkaline Phosphatase: 105 U/L (ref 38–126)
Anion gap: 7 (ref 5–15)
BUN: 23 mg/dL (ref 8–23)
CO2: 24 mmol/L (ref 22–32)
Calcium: 8.9 mg/dL (ref 8.9–10.3)
Chloride: 110 mmol/L (ref 98–111)
Creatinine, Ser: 1.37 mg/dL — ABNORMAL HIGH (ref 0.44–1.00)
GFR, Estimated: 43 mL/min — ABNORMAL LOW (ref >60)
Glucose, Bld: 178 mg/dL — ABNORMAL HIGH (ref 70–99)
Potassium: 3.8 mmol/L (ref 3.5–5.1)
Sodium: 141 mmol/L (ref 135–145)
Total Bilirubin: 0.5 mg/dL (ref 0.3–1.2)
Total Protein: 7.8 g/dL (ref 6.5–8.1)

## 2022-03-10 LAB — CBC WITH DIFFERENTIAL/PLATELET
Abs Immature Granulocytes: 0 10*3/uL (ref 0.00–0.07)
Basophils Absolute: 0 10*3/uL (ref 0.0–0.1)
Basophils Relative: 1 %
Eosinophils Absolute: 0 10*3/uL (ref 0.0–0.5)
Eosinophils Relative: 0 %
HCT: 30.7 % — ABNORMAL LOW (ref 36.0–46.0)
Hemoglobin: 9.8 g/dL — ABNORMAL LOW (ref 12.0–15.0)
Immature Granulocytes: 0 %
Lymphocytes Relative: 43 %
Lymphs Abs: 2.1 10*3/uL (ref 0.7–4.0)
MCH: 29.4 pg (ref 26.0–34.0)
MCHC: 31.9 g/dL (ref 30.0–36.0)
MCV: 92.2 fL (ref 80.0–100.0)
Monocytes Absolute: 0.4 10*3/uL (ref 0.1–1.0)
Monocytes Relative: 8 %
Neutro Abs: 2.3 10*3/uL (ref 1.7–7.7)
Neutrophils Relative %: 48 %
Platelets: 268 10*3/uL (ref 150–400)
RBC: 3.33 MIL/uL — ABNORMAL LOW (ref 3.87–5.11)
RDW: 13.1 % (ref 11.5–15.5)
WBC: 4.8 10*3/uL (ref 4.0–10.5)
nRBC: 0 % (ref 0.0–0.2)

## 2022-03-10 NOTE — ED Triage Notes (Signed)
Pt reports with a headache since this morning. Pt also has a lump on her head that has been there for years.  ?

## 2022-03-11 DIAGNOSIS — R519 Headache, unspecified: Secondary | ICD-10-CM | POA: Diagnosis not present

## 2022-03-11 MED ORDER — DIPHENHYDRAMINE HCL 50 MG/ML IJ SOLN
12.5000 mg | Freq: Once | INTRAMUSCULAR | Status: AC
  Administered 2022-03-11: 12.5 mg via INTRAVENOUS
  Filled 2022-03-11: qty 1

## 2022-03-11 MED ORDER — IBUPROFEN 200 MG PO TABS
600.0000 mg | ORAL_TABLET | Freq: Once | ORAL | Status: AC
  Administered 2022-03-11: 600 mg via ORAL
  Filled 2022-03-11: qty 3

## 2022-03-11 MED ORDER — SODIUM CHLORIDE 0.9 % IV BOLUS
1000.0000 mL | Freq: Once | INTRAVENOUS | Status: AC
  Administered 2022-03-11: 1000 mL via INTRAVENOUS

## 2022-03-11 MED ORDER — METOCLOPRAMIDE HCL 5 MG/ML IJ SOLN
10.0000 mg | Freq: Once | INTRAMUSCULAR | Status: AC
  Administered 2022-03-11: 10 mg via INTRAVENOUS
  Filled 2022-03-11: qty 2

## 2022-03-11 NOTE — ED Provider Notes (Signed)
?[facility] DEPT ?Provider Note ? ? ?CSN: [id number] ?Arrival date & time: 03/10/22  1952 ? ?  ? ?History ? ?Chief Complaint  ?Patient presents with  ? Headache  ? ? ?Tricia Huynh is a 66 y.o. female. ? ?HPI ? ?Patient with medical history including diabetes, seizures, hypertension, migraines presents  with complaints of a headache.  Patient she has been having this headache for the last 3 months, states the headache is intermittent will come and go, states that she will feel the headache in the back of her head and then go into the front of her head, last couple hours then resolve on its own.  She has no associate change in vision paresthesias or weakness upper or lower extremities denies any lightheaded dizziness nausea or vomiting.  States that she has had headaches like this in the past on the respect of her life no recent head trauma, not anticoag's. ? ? ?Reviewed patient's chart had CT head performed 3 months ago which was unremarkable. ?Home Medications ?Prior to Admission medications   ?Medication Sig Start Date End Date Taking? Authorizing Provider  ?Accu-Chek Softclix Lancets lancets 1 each by Other route 2 (two) times daily. E11.9 12/20/19   Renato Shin, MD  ?acetaminophen (TYLENOL) 500 MG tablet Take 1,000 mg by mouth every 6 (six) hours as needed for mild pain or headache.    [provider]  ?amitriptyline (ELAVIL) 50 MG tablet Take 50 mg by mouth daily. 05/08/15   [provider]  ?amLODipine-olmesartan (AZOR) 5-40 MG tablet Take 1 tablet by mouth daily. 10/08/16   [provider]  ?aspirin EC 81 MG tablet Take 81 mg by mouth daily.    [provider]  ?atorvastatin (LIPITOR) 40 MG tablet Take 40 mg by mouth daily. 12/23/19   [provider]  ?Blood Glucose Monitoring Suppl (ACCU-CHEK AVIVA PLUS) w/Device KIT 1 each by Does not apply route 2 (two) times daily. E11.9 12/20/19   Renato Shin, MD  ?gabapentin (NEURONTIN) 100  MG capsule Take 100 mg by mouth 3 (three) times daily. 12/23/19   [provider]  ?glucose blood (ACCU-CHEK AVIVA PLUS) test strip 1 each by Other route 2 (two) times daily. E11.9 12/20/19   Renato Shin, MD  ?hydrocortisone (ANUSOL-HC) 2.5 % rectal cream Apply 1 application topically 2 (two) times daily. 03/25/21   [provider]  ?Insulin Glargine (LANTUS SOLOSTAR) 100 UNIT/ML Solostar Pen Inject 24 Units into the skin every morning. And pen needles 1/day ?Patient taking differently: Inject 20 Units into the skin 2 (two) times daily. 12/20/19   Renato Shin, MD  ?levETIRAcetam (KEPPRA) 500 MG tablet Take 1 tablet (500 mg total) by mouth 2 (two) times daily. 02/10/21 05/30/21  Fatima Blank, MD  ?ondansetron (ZOFRAN ODT) 4 MG disintegrating tablet 16m ODT q4 hours prn nausea/vomit 06/23/21   YDrenda Freeze MD  ?ondansetron (ZOFRAN) 4 MG tablet Take 1 tablet (4 mg total) by mouth every 8 (eight) hours as needed for nausea or vomiting. ?Patient not taking: No sig reported 12/19/20   AShelly Coss MD  ?ondansetron (ZOFRAN) 4 MG tablet Take 1 tablet (4 mg total) by mouth every 6 (six) hours. 07/24/21   MValarie Merino MD  ?promethazine (PHENERGAN) 25 MG suppository Place 1 suppository (25 mg total) rectally every 6 (six) hours as needed for nausea or vomiting. 07/24/21   MValarie Merino MD  ?promethazine (PHENERGAN) 25 MG tablet Take 1 tablet (25 mg total) by mouth  every 8 (eight) hours as needed for up to 6 doses for refractory nausea / vomiting. 07/05/21   Wyvonnia Dusky, MD  ?sertraline (ZOLOFT) 50 MG tablet Take 50 mg by mouth daily. 01/07/19   [provider]  ?tamsulosin (FLOMAX) 0.4 MG CAPS capsule Take 1 capsule (0.4 mg total) by mouth daily. 06/23/21   Drenda Freeze, MD  ?traMADol (ULTRAM) 50 MG tablet Take 1 tablet (50 mg total) by mouth every 6 (six) hours as needed for moderate pain. 06/23/21   Drenda Freeze, MD  ?   ? ?Allergies    ?Percocet  [oxycodone-acetaminophen] and Vicodin [hydrocodone-acetaminophen]   ? ?Review of Systems   ?Review of Systems  ?Constitutional:  Negative for chills and fever.  ?Respiratory:  Negative for shortness of breath.   ?Cardiovascular:  Negative for chest pain.  ?Gastrointestinal:  Negative for abdominal pain.  ?Neurological:  Positive for headaches.  ? ?Physical Exam ?Updated Vital Signs ?BP (!) 163/94   Pulse 83   Temp 98 ?F (36.7 ?C) (Oral)   Resp 18   Ht 5' 7"  (1.702 m)   Wt 59 kg   SpO2 98%   BMI 20.36 kg/m?  ?Physical Exam ?Vitals and nursing note reviewed.  ?Constitutional:   ?   General: She is not in acute distress. ?   Appearance: She is not ill-appearing.  ?HENT:  ?   Head: Normocephalic and atraumatic.  ?   Comments: No deformity of the head present no raccoon eyes battle sign noted ?   Nose: No congestion.  ?Eyes:  ?   Conjunctiva/sclera: Conjunctivae normal.  ?Cardiovascular:  ?   Rate and Rhythm: Normal rate and regular rhythm.  ?   Pulses: Normal pulses.  ?   Heart sounds: No murmur heard. ?  No friction rub. No gallop.  ?Pulmonary:  ?   Effort: No respiratory distress.  ?   Breath sounds: No wheezing, rhonchi or rales.  ?Skin: ?   General: Skin is warm and dry.  ?Neurological:  ?   Mental Status: She is alert.  ?   Cranial Nerves: Cranial nerves 2-12 are intact. No cranial nerve deficit.  ?   Motor: Motor function is intact.  ?   Coordination: Coordination is intact. Romberg sign negative. Finger-Nose-Finger Test normal.  ?   Comments: Cranial nerves II through XII grossly intact following two-step commands no unilateral weakness present  ?Psychiatric:     ?   Mood and Affect: Mood normal.  ? ? ?ED Results / Procedures / Treatments   ?Labs ?(all labs ordered are listed, but only abnormal results are displayed) ?Labs Reviewed  ?CBC WITH DIFFERENTIAL/PLATELET - Abnormal; Notable for the following components:  ?    Result Value  ? RBC 3.33 (*)   ? Hemoglobin 9.8 (*)   ? HCT 30.7 (*)   ? All other  components within normal limits  ?COMPREHENSIVE METABOLIC PANEL - Abnormal; Notable for the following components:  ? Glucose, Bld 178 (*)   ? Creatinine, Ser 1.37 (*)   ? Albumin 3.4 (*)   ? GFR, Estimated 43 (*)   ? All other components within normal limits  ? ? ?EKG ?None ? ?Radiology ?No results found. ? ?Procedures ?Procedures  ? ? ?Medications Ordered in ED ?Medications  ?sodium chloride 0.9 % bolus 1,000 mL (0 mLs Intravenous Stopped 03/11/22 0440)  ?ibuprofen (ADVIL) tablet 600 mg (600 mg Oral Given 03/11/22 0339)  ?metoCLOPramide (REGLAN) injection 10 mg (10 mg Intravenous Given  03/11/22 3016)  ?diphenhydrAMINE (BENADRYL) injection 12.5 mg (12.5 mg Intravenous Given 03/11/22 0339)  ? ? ?ED Course/ Medical Decision Making/ A&P ?  ?                        ?Medical Decision Making ?Amount and/or Complexity of Data Reviewed ?Labs: ordered. ? ?Risk ?OTC drugs. ?Prescription drug management. ? ? ?This patient presents to the ED for concern of headache, this involves an extensive number of treatment options, and is a complaint that carries with it a high risk of complications and morbidity.  The differential diagnosis includes CVA, intracranial head bleed, meningitis ? ? ? ?Additional history obtained: ? ?Additional history obtained from N/A ?External records from outside source obtained and reviewed including previous imaging ? ? ?Co morbidities that complicate the patient evaluation ? ?Migraines, hypertension ? ?Social Determinants of Health: ? ?N/A ? ? ? ?Lab Tests: ? ?I Ordered, and personally interpreted labs.  The pertinent results include: CBC shows normocytic anemia hemoglobin 9.8, CMP shows glucose 178 creatinine 1.3 GFR 43 ? ? ?Imaging Studies ordered: ? ?I ordered imaging studies including N/A ?I independently visualized and interpreted imaging which showed N/A ?I agree with the radiologist interpretation ? ? ?Cardiac Monitoring: ? ?The patient was maintained on a cardiac monitor.  I personally viewed and  interpreted the cardiac monitored which showed an underlying rhythm of: N/A ? ? ?Medicines ordered and prescription drug management: ? ?I ordered medication including fluids, migraine cocktail ?I have reviewed the patients home medicines

## 2022-03-11 NOTE — Discharge Instructions (Signed)
Likely resulting from a migraine recommend over-the-counter pain medication as needed please stay hydrated get plenty of sleep decrease on stress ? ?Please follow-up with neurology. ? ?Come back to the emergency department if you develop chest pain, shortness of breath, severe abdominal pain, uncontrolled nausea, vomiting, diarrhea. ? ?

## 2022-03-27 ENCOUNTER — Ambulatory Visit
Admission: RE | Admit: 2022-03-27 | Discharge: 2022-03-27 | Disposition: A | Discharge status: Home / Self Care | Payer: 59 | Source: Ambulatory Visit | Attending: Family Medicine | Admitting: Family Medicine

## 2022-03-27 ENCOUNTER — Telehealth: Payer: Self-pay | Admitting: Physician Assistant

## 2022-03-27 ENCOUNTER — Other Ambulatory Visit: Payer: Self-pay | Admitting: Family Medicine

## 2022-03-27 DIAGNOSIS — F17211 Nicotine dependence, cigarettes, in remission: Secondary | ICD-10-CM

## 2022-03-27 NOTE — Telephone Encounter (Signed)
Scheduled appt per 5/10 referral. Pt is aware of appt date and time. Pt is aware to arrive 15 mins prior to appt time and to bring and updated insurance card. Pt is aware of appt location.   ?

## 2022-03-31 ENCOUNTER — Other Ambulatory Visit: Payer: Self-pay | Admitting: Family Medicine

## 2022-03-31 DIAGNOSIS — R220 Localized swelling, mass and lump, head: Secondary | ICD-10-CM

## 2022-04-08 NOTE — Progress Notes (Unsigned)
[place] [facility] [phone]   [phone]) [phone]  CONSULT NOTE  REFERRING PHYSICIAN: Gregor Hams  REASON FOR CONSULTATION:  Iron deficiency anemia  HPI Tricia Huynh is a 66 y.o. female with a past medical history significant for hypertension, hemorrhoids, pneumonia, colitis, diabetes, peripheral neuropathy, DKA, acute encephalopathy, seizure disorder, chronic kidney disease, and hyperlipidemia is referred to the clinic for iron deficiency anemia.  Patient had a follow-up visit with her primary care provider on 03/20/2022 to recheck her blood pressure.  Possible thalassemia minor?  Previously saw Dr. Satira Sark?  She had blood work performed which showed normocytic anemia with a red blood cell/hemoglobin low at 9.3, MCV normal at 87.3.  Her platelet count is normal at 285 K and her white blood cell count is normal at 5.6.  She is referred to hematology for further evaluation regarding these findings.   For her GI history ***. She previously saw Dr. Rush Landmark for *****Did you see them??? When??? What did you discuss or did you go to [facility] or [place]?***. She had seen Dr. Alessandra Bevels  for possible removal of a large polyp in the distal rectal that was found back in September 2022.  The colonoscopy showed a 15 mm flat polypoid lesion in the distal rectum.  Biopsy showed AIN 1.  AIN (anal intraepithelial neoplasia). Dr. Rush Landmark felt that  she would achieve a more definitive attempt at en bloc resection with an ESD versus a transanal excision rather than piecemeal EMR that he would be able to offer. He recommended referral to a location where ESD can be considered such as [facility] gastroenterologist ([place]) or [facility] gastroenterologist Stephanie Acre) may be in her best interests if she wants an attempt at en bloc resection.  ***She decided to ***  Regarding her anemia history, the oldest records available to me are from 2015.  It appears she developed anemia in 2016.  The  patient thinks that she has been anemic since ***.  She has never required an iron infusion or blood transfusion.  The patient has not been taking any oral iron supplements for at least *** years secondary to ***. Which type? Compliant***     Postmenopausal.    Regarding symptoms of her anemia, she reports fatigue.  She has occasional lightheadedness, occasional dyspnea on exertion, and reports occasional sensation of palpitations.  She also reports feeling cold frequently.  She denies gingival bleeding, hemoptysis, hematemesis, melena, epistaxis, or hematochezia.  Colonoscopy? When and who? Findings? She denies any fever or night sweats. She denies any lymphadenopathy.  She denies any NSAID use ***.  She denies any chronic kidney disease*** with a baseline creatinine ~1.3. She denies any particular dietary habits such as being in a vegan or vegetarian. She does crave ice chips.   Denies any history of bariatric surgery.    HPI  Past Medical History:  Diagnosis Date   Diabetes mellitus type 2 in nonobese ([place]) 06/06/2015   Diabetes mellitus without complication ([place])    Diabetic neuropathy ([place]) 06/06/2015   Dyslipidemia 06/06/2015   Encephalopathy    Essential hypertension 06/06/2015   Hypertension    Mood disorder ([place]) 06/06/2015   Pneumonia 03/11/2017   Seizure ([place])    sees [name]. abd eeg, on keppra    Past Surgical History:  Procedure Laterality Date   ABDOMINAL HYSTERECTOMY     EYE SURGERY Bilateral    TUBAL LIGATION      Family History  Problem Relation Age of Onset   Heart disease  Father    Kidney disease Father    Diabetes Mother    Seizures Sister    Seizures Brother     Social History Social History   Tobacco Use   Smoking status: Former    Types: Cigarettes   Smokeless tobacco: Never  Scientific laboratory technician Use: Never used  Substance Use Topics   Alcohol use: No   Drug use: No    Allergies  Allergen Reactions   Percocet [Oxycodone-Acetaminophen] Nausea And  Vomiting and Other (See Comments)    Causes the patient to be shaky/unsteady, also   Vicodin [Hydrocodone-Acetaminophen] Nausea And Vomiting and Other (See Comments)    Causes the patient to be shaky/unsteady, also    Current Outpatient Medications  Medication Sig Dispense Refill   Accu-Chek Softclix Lancets lancets 1 each by Other route 2 (two) times daily. E11.9 100 each 2   acetaminophen (TYLENOL) 500 MG tablet Take 1,000 mg by mouth every 6 (six) hours as needed for mild pain or headache.     amitriptyline (ELAVIL) 50 MG tablet Take 50 mg by mouth daily.  1   amLODipine-olmesartan (AZOR) 5-40 MG tablet Take 1 tablet by mouth daily.     aspirin EC 81 MG tablet Take 81 mg by mouth daily.     atorvastatin (LIPITOR) 40 MG tablet Take 40 mg by mouth daily.     Blood Glucose Monitoring Suppl (ACCU-CHEK AVIVA PLUS) w/Device KIT 1 each by Does not apply route 2 (two) times daily. E11.9 1 kit 0   gabapentin (NEURONTIN) 100 MG capsule Take 100 mg by mouth 3 (three) times daily.     glucose blood (ACCU-CHEK AVIVA PLUS) test strip 1 each by Other route 2 (two) times daily. E11.9 100 each 2   hydrocortisone (ANUSOL-HC) 2.5 % rectal cream Apply 1 application topically 2 (two) times daily.     Insulin Glargine (LANTUS SOLOSTAR) 100 UNIT/ML Solostar Pen Inject 24 Units into the skin every morning. And pen needles 1/day (Patient taking differently: Inject 20 Units into the skin 2 (two) times daily.) 10 pen 3   levETIRAcetam (KEPPRA) 500 MG tablet Take 1 tablet (500 mg total) by mouth 2 (two) times daily. 60 tablet 2   ondansetron (ZOFRAN ODT) 4 MG disintegrating tablet 24m ODT q4 hours prn nausea/vomit 10 tablet 0   ondansetron (ZOFRAN) 4 MG tablet Take 1 tablet (4 mg total) by mouth every 8 (eight) hours as needed for nausea or vomiting. (Patient not taking: No sig reported) 20 tablet 0   ondansetron (ZOFRAN) 4 MG tablet Take 1 tablet (4 mg total) by mouth every 6 (six) hours. 12 tablet 0   promethazine  (PHENERGAN) 25 MG suppository Place 1 suppository (25 mg total) rectally every 6 (six) hours as needed for nausea or vomiting. 12 each 0   promethazine (PHENERGAN) 25 MG tablet Take 1 tablet (25 mg total) by mouth every 8 (eight) hours as needed for up to 6 doses for refractory nausea / vomiting. 6 tablet 0   sertraline (ZOLOFT) 50 MG tablet Take 50 mg by mouth daily.     tamsulosin (FLOMAX) 0.4 MG CAPS capsule Take 1 capsule (0.4 mg total) by mouth daily. 5 capsule 0   traMADol (ULTRAM) 50 MG tablet Take 1 tablet (50 mg total) by mouth every 6 (six) hours as needed for moderate pain. 10 tablet 0   No current facility-administered medications for this visit.    REVIEW OF SYSTEMS:   Review of Systems  Constitutional: Negative for appetite change, chills, fatigue, fever and unexpected weight change.  HENT:   Negative for mouth sores, nosebleeds, sore throat and trouble swallowing.   Eyes: Negative for eye problems and icterus.  Respiratory: Negative for cough, hemoptysis, shortness of breath and wheezing.   Cardiovascular: Negative for chest pain and leg swelling.  Gastrointestinal: Negative for abdominal pain, constipation, diarrhea, nausea and vomiting.  Genitourinary: Negative for bladder incontinence, difficulty urinating, dysuria, frequency and hematuria.   Musculoskeletal: Negative for back pain, gait problem, neck pain and neck stiffness.  Skin: Negative for itching and rash.  Neurological: Negative for dizziness, extremity weakness, gait problem, headaches, light-headedness and seizures.  Hematological: Negative for adenopathy. Does not bruise/bleed easily.  Psychiatric/Behavioral: Negative for confusion, depression and sleep disturbance. The patient is not nervous/anxious.     PHYSICAL EXAMINATION:  There were no vitals taken for this visit.  ECOG PERFORMANCE STATUS: {CHL ONC ECOG [id number]  Physical Exam  Constitutional: Oriented to person, place, and time and  well-developed, well-nourished, and in no distress. No distress.  HENT:  Head: Normocephalic and atraumatic.  Mouth/Throat: Oropharynx is clear and moist. No oropharyngeal exudate.  Eyes: Conjunctivae are normal. Right eye exhibits no discharge. Left eye exhibits no discharge. No scleral icterus.  Neck: Normal range of motion. Neck supple.  Cardiovascular: Normal rate, regular rhythm, normal heart sounds and intact distal pulses.   Pulmonary/Chest: Effort normal and breath sounds normal. No respiratory distress. No wheezes. No rales.  Abdominal: Soft. Bowel sounds are normal. Exhibits no distension and no mass. There is no tenderness.  Musculoskeletal: Normal range of motion. Exhibits no edema.  Lymphadenopathy:    No cervical adenopathy.  Neurological: Alert and oriented to person, place, and time. Exhibits normal muscle tone. Gait normal. Coordination normal.  Skin: Skin is warm and dry. No rash noted. Not diaphoretic. No erythema. No pallor.  Psychiatric: Mood, memory and judgment normal.  Vitals reviewed.  LABORATORY DATA: Lab Results  Component Value Date   WBC 4.8 03/10/2022   HGB 9.8 (L) 03/10/2022   HCT 30.7 (L) 03/10/2022   MCV 92.2 03/10/2022   PLT 268 03/10/2022      Chemistry      Component Value Date/Time   NA 141 03/10/2022 2041   K 3.8 03/10/2022 2041   CL 110 03/10/2022 2041   CO2 24 03/10/2022 2041   BUN 23 03/10/2022 2041   CREATININE 1.37 (H) 03/10/2022 2041      Component Value Date/Time   CALCIUM 8.9 03/10/2022 2041   ALKPHOS 105 03/10/2022 2041   AST 16 03/10/2022 2041   ALT 14 03/10/2022 2041   BILITOT 0.5 03/10/2022 2041       RADIOGRAPHIC STUDIES: CT CHEST WO CONTRAST  Result Date: 03/31/2022 CLINICAL DATA:  66 year old female former smoker (quit 18 years ago) with 23 pack-year history of smoking. Unexplained weight loss. EXAM: CT CHEST WITHOUT CONTRAST TECHNIQUE: Multidetector CT imaging of the chest was performed following the standard  protocol without IV contrast. RADIATION DOSE REDUCTION: This exam was performed according to the departmental dose-optimization program which includes automated exposure control, adjustment of the mA and/or kV according to patient size and/or use of iterative reconstruction technique. COMPARISON:  Chest CTA 05/26/2016. FINDINGS: Cardiovascular: Heart size is normal. There is no significant pericardial fluid, thickening or pericardial calcification. There is aortic atherosclerosis, as well as atherosclerosis of the great vessels of the mediastinum and the coronary arteries, including calcified atherosclerotic plaque in the left main and left anterior descending coronary  arteries. Mediastinum/Nodes: No pathologically enlarged mediastinal or hilar lymph nodes. Esophagus is unremarkable in appearance. No axillary lymphadenopathy. Lungs/Pleura: 3 mm right middle lobe pulmonary nodule (axial image 111 of series 5). No larger more suspicious appearing pulmonary nodules or masses are noted. No acute consolidative airspace disease. No pleural effusions. Upper Abdomen: Aortic atherosclerosis. Musculoskeletal: There are no aggressive appearing lytic or blastic lesions noted in the visualized portions of the skeleton. IMPRESSION: 1. No findings to account for the patient's unexplained weight loss. 2. 3 mm right middle lobe pulmonary nodule (nonspecific, but statistically likely benign). No follow-up needed if patient is low-risk.This recommendation follows the consensus statement: Guidelines for Management of Incidental Pulmonary Nodules Detected on CT Images: From the Fleischner Society 2017; Radiology 2017; 284:228-243. 3. Aortic atherosclerosis, in addition to left main and left anterior descending coronary artery disease. Please note that although the presence of coronary artery calcium documents the presence of coronary artery disease, the severity of this disease and any potential stenosis cannot be assessed on this  non-gated CT examination. Assessment for potential risk factor modification, dietary therapy or pharmacologic therapy may be warranted, if clinically indicated. Aortic Atherosclerosis (ICD10-I70.0). Electronically Signed   By: Vinnie Langton M.D.   On: 03/31/2022 05:44    ASSESSMENT: Is a very pleasant 66 year old African-American female referred to the clinic for iron deficiency anemia.  The patient was seen with Dr. Julien Nordmann today.  The patient is here for lab studies performed including a CBC, CMP, iron studies, ferritin, vitamin B12, SPEP with immunofixation, and folic acid drawn today.  Her labs from today demonstrate ***.   Dr. Julien Nordmann recommends that we arrange for IV iron with Venofer 300 mg weekly x3.  I will arrange for this to be performed at the [facility] infusion center.  The patient strongly encouraged to continue taking oral iron supplement.  She was advised to take this with vitamin C as it helps with iron absorption.  She was also given a handout of iron rich food.  The patient was given stool cards to rule out GI blood loss.  She was advised to return these to the clinic at her earliest convenience.   We will see the patient back for follow-up visit in 3 months for evaluation and repeat blood work.  The patient voices understanding of current disease status and treatment options and is in agreement with the current care plan.  All questions were answered. The patient knows to call the clinic with any problems, questions or concerns. We can certainly see the patient much sooner if necessary.  Thank you so much for allowing me to participate in the care of Tricia Huynh. I will continue to follow up the patient with you and assist in her care.  I spent {CHL ONC TIME VISIT - EGBTD:[id number]} counseling the patient face to face. The total time spent in the appointment was {CHL ONC TIME VISIT - GGYIR:[id number]}.  Disclaimer: This note was dictated with voice  recognition software. Similar sounding words can inadvertently be transcribed and may not be corrected upon review.   [place] Apr 08, 2022, 10:25 AM

## 2022-04-09 ENCOUNTER — Inpatient Hospital Stay: Admission: RE | Admit: 2022-04-09 | Payer: 59 | Source: Ambulatory Visit

## 2022-04-09 ENCOUNTER — Other Ambulatory Visit: Payer: Self-pay | Admitting: Physician Assistant

## 2022-04-09 DIAGNOSIS — D509 Iron deficiency anemia, unspecified: Secondary | ICD-10-CM

## 2022-04-10 ENCOUNTER — Inpatient Hospital Stay: Payer: 59

## 2022-04-10 ENCOUNTER — Inpatient Hospital Stay: Payer: 59 | Attending: Physician Assistant | Admitting: Physician Assistant

## 2022-04-11 ENCOUNTER — Ambulatory Visit
Admission: RE | Admit: 2022-04-11 | Discharge: 2022-04-11 | Disposition: A | Discharge status: Home / Self Care | Payer: 59 | Source: Ambulatory Visit | Attending: Family Medicine | Admitting: Family Medicine

## 2022-04-11 ENCOUNTER — Other Ambulatory Visit: Payer: 59

## 2022-04-11 DIAGNOSIS — R220 Localized swelling, mass and lump, head: Secondary | ICD-10-CM

## 2022-04-27 ENCOUNTER — Observation Stay (HOSPITAL_COMMUNITY): Payer: 59

## 2022-04-27 ENCOUNTER — Other Ambulatory Visit: Payer: Self-pay

## 2022-04-27 ENCOUNTER — Emergency Department (HOSPITAL_COMMUNITY): Payer: 59

## 2022-04-27 ENCOUNTER — Encounter (HOSPITAL_COMMUNITY): Payer: Self-pay | Admitting: Emergency Medicine

## 2022-04-27 ENCOUNTER — Inpatient Hospital Stay (HOSPITAL_COMMUNITY)
Admission: EM | Admit: 2022-04-27 | Discharge: 2022-05-01 | DRG: 690 | Disposition: A | Discharge status: Home Health | Payer: 59 | Attending: Internal Medicine | Admitting: Internal Medicine

## 2022-04-27 DIAGNOSIS — N3000 Acute cystitis without hematuria: Secondary | ICD-10-CM | POA: Diagnosis present

## 2022-04-27 DIAGNOSIS — Z79899 Other long term (current) drug therapy: Secondary | ICD-10-CM

## 2022-04-27 DIAGNOSIS — E1165 Type 2 diabetes mellitus with hyperglycemia: Secondary | ICD-10-CM | POA: Insufficient documentation

## 2022-04-27 DIAGNOSIS — I5032 Chronic diastolic (congestive) heart failure: Secondary | ICD-10-CM | POA: Diagnosis not present

## 2022-04-27 DIAGNOSIS — E1042 Type 1 diabetes mellitus with diabetic polyneuropathy: Secondary | ICD-10-CM

## 2022-04-27 DIAGNOSIS — R64 Cachexia: Secondary | ICD-10-CM | POA: Diagnosis present

## 2022-04-27 DIAGNOSIS — N10 Acute tubulo-interstitial nephritis: Secondary | ICD-10-CM | POA: Diagnosis not present

## 2022-04-27 DIAGNOSIS — E11649 Type 2 diabetes mellitus with hypoglycemia without coma: Secondary | ICD-10-CM | POA: Diagnosis present

## 2022-04-27 DIAGNOSIS — E440 Moderate protein-calorie malnutrition: Secondary | ICD-10-CM | POA: Diagnosis present

## 2022-04-27 DIAGNOSIS — Z833 Family history of diabetes mellitus: Secondary | ICD-10-CM

## 2022-04-27 DIAGNOSIS — Z90710 Acquired absence of both cervix and uterus: Secondary | ICD-10-CM

## 2022-04-27 DIAGNOSIS — Z794 Long term (current) use of insulin: Secondary | ICD-10-CM

## 2022-04-27 DIAGNOSIS — N183 Chronic kidney disease, stage 3 unspecified: Secondary | ICD-10-CM | POA: Diagnosis present

## 2022-04-27 DIAGNOSIS — B961 Klebsiella pneumoniae [K. pneumoniae] as the cause of diseases classified elsewhere: Secondary | ICD-10-CM | POA: Diagnosis present

## 2022-04-27 DIAGNOSIS — E1140 Type 2 diabetes mellitus with diabetic neuropathy, unspecified: Secondary | ICD-10-CM | POA: Diagnosis present

## 2022-04-27 DIAGNOSIS — Z8249 Family history of ischemic heart disease and other diseases of the circulatory system: Secondary | ICD-10-CM

## 2022-04-27 DIAGNOSIS — E118 Type 2 diabetes mellitus with unspecified complications: Secondary | ICD-10-CM | POA: Diagnosis present

## 2022-04-27 DIAGNOSIS — I10 Essential (primary) hypertension: Secondary | ICD-10-CM | POA: Diagnosis not present

## 2022-04-27 DIAGNOSIS — N179 Acute kidney failure, unspecified: Secondary | ICD-10-CM | POA: Diagnosis not present

## 2022-04-27 DIAGNOSIS — L729 Follicular cyst of the skin and subcutaneous tissue, unspecified: Secondary | ICD-10-CM | POA: Diagnosis present

## 2022-04-27 DIAGNOSIS — R29818 Other symptoms and signs involving the nervous system: Secondary | ICD-10-CM | POA: Diagnosis present

## 2022-04-27 DIAGNOSIS — N390 Urinary tract infection, site not specified: Secondary | ICD-10-CM | POA: Diagnosis present

## 2022-04-27 DIAGNOSIS — R109 Unspecified abdominal pain: Secondary | ICD-10-CM | POA: Diagnosis not present

## 2022-04-27 DIAGNOSIS — R739 Hyperglycemia, unspecified: Principal | ICD-10-CM

## 2022-04-27 DIAGNOSIS — D649 Anemia, unspecified: Secondary | ICD-10-CM | POA: Diagnosis present

## 2022-04-27 DIAGNOSIS — E872 Acidosis, unspecified: Secondary | ICD-10-CM | POA: Diagnosis present

## 2022-04-27 DIAGNOSIS — Z681 Body mass index (BMI) 19 or less, adult: Secondary | ICD-10-CM

## 2022-04-27 DIAGNOSIS — E1122 Type 2 diabetes mellitus with diabetic chronic kidney disease: Secondary | ICD-10-CM | POA: Diagnosis present

## 2022-04-27 DIAGNOSIS — Z87891 Personal history of nicotine dependence: Secondary | ICD-10-CM

## 2022-04-27 DIAGNOSIS — N1832 Chronic kidney disease, stage 3b: Secondary | ICD-10-CM | POA: Diagnosis present

## 2022-04-27 DIAGNOSIS — E860 Dehydration: Secondary | ICD-10-CM | POA: Diagnosis present

## 2022-04-27 DIAGNOSIS — I130 Hypertensive heart and chronic kidney disease with heart failure and stage 1 through stage 4 chronic kidney disease, or unspecified chronic kidney disease: Secondary | ICD-10-CM | POA: Diagnosis present

## 2022-04-27 DIAGNOSIS — E785 Hyperlipidemia, unspecified: Secondary | ICD-10-CM | POA: Diagnosis present

## 2022-04-27 DIAGNOSIS — Z20822 Contact with and (suspected) exposure to covid-19: Secondary | ICD-10-CM | POA: Diagnosis present

## 2022-04-27 DIAGNOSIS — Z7982 Long term (current) use of aspirin: Secondary | ICD-10-CM

## 2022-04-27 LAB — MAGNESIUM: Magnesium: 1.9 mg/dL (ref 1.7–2.4)

## 2022-04-27 LAB — BLOOD GAS, VENOUS
Acid-base deficit: 4.1 mmol/L — ABNORMAL HIGH (ref 0.0–2.0)
Bicarbonate: 23 mmol/L (ref 20.0–28.0)
O2 Saturation: 44.5 %
Patient temperature: 37.1
pCO2, Ven: 49 mmHg (ref 44–60)
pH, Ven: 7.28 (ref 7.25–7.43)
pO2, Ven: <31 mmHg — CL (ref 32–45)

## 2022-04-27 LAB — HEPATIC FUNCTION PANEL
ALT: 12 U/L (ref 0–44)
AST: 16 U/L (ref 15–41)
Albumin: 3 g/dL — ABNORMAL LOW (ref 3.5–5.0)
Alkaline Phosphatase: 147 U/L — ABNORMAL HIGH (ref 38–126)
Bilirubin, Direct: <0.1 mg/dL (ref 0.0–0.2)
Total Bilirubin: 0.4 mg/dL (ref 0.3–1.2)
Total Protein: 7.7 g/dL (ref 6.5–8.1)

## 2022-04-27 LAB — URINALYSIS, ROUTINE W REFLEX MICROSCOPIC
Bilirubin Urine: NEGATIVE
Glucose, UA: >=500 mg/dL — AB
Ketones, ur: NEGATIVE mg/dL
Nitrite: NEGATIVE
Protein, ur: >=300 mg/dL — AB
Specific Gravity, Urine: 1.021 (ref 1.005–1.030)
pH: 5 (ref 5.0–8.0)

## 2022-04-27 LAB — CBC WITH DIFFERENTIAL/PLATELET
Abs Immature Granulocytes: 0.03 10*3/uL (ref 0.00–0.07)
Basophils Absolute: 0 10*3/uL (ref 0.0–0.1)
Basophils Relative: 0 %
Eosinophils Absolute: 0 10*3/uL (ref 0.0–0.5)
Eosinophils Relative: 0 %
HCT: 27.5 % — ABNORMAL LOW (ref 36.0–46.0)
Hemoglobin: 9.2 g/dL — ABNORMAL LOW (ref 12.0–15.0)
Immature Granulocytes: 1 %
Lymphocytes Relative: 16 %
Lymphs Abs: 1 10*3/uL (ref 0.7–4.0)
MCH: 29.9 pg (ref 26.0–34.0)
MCHC: 33.5 g/dL (ref 30.0–36.0)
MCV: 89.3 fL (ref 80.0–100.0)
Monocytes Absolute: 0.3 10*3/uL (ref 0.1–1.0)
Monocytes Relative: 5 %
Neutro Abs: 5.1 10*3/uL (ref 1.7–7.7)
Neutrophils Relative %: 78 %
Platelets: 241 10*3/uL (ref 150–400)
RBC: 3.08 MIL/uL — ABNORMAL LOW (ref 3.87–5.11)
RDW: 12.5 % (ref 11.5–15.5)
WBC: 6.5 10*3/uL (ref 4.0–10.5)
nRBC: 0 % (ref 0.0–0.2)

## 2022-04-27 LAB — COMPREHENSIVE METABOLIC PANEL
ALT: 13 U/L (ref 0–44)
AST: 14 U/L — ABNORMAL LOW (ref 15–41)
Albumin: 3 g/dL — ABNORMAL LOW (ref 3.5–5.0)
Alkaline Phosphatase: 208 U/L — ABNORMAL HIGH (ref 38–126)
Anion gap: 10 (ref 5–15)
BUN: 30 mg/dL — ABNORMAL HIGH (ref 8–23)
CO2: 21 mmol/L — ABNORMAL LOW (ref 22–32)
Calcium: 9.2 mg/dL (ref 8.9–10.3)
Chloride: 95 mmol/L — ABNORMAL LOW (ref 98–111)
Creatinine, Ser: 1.84 mg/dL — ABNORMAL HIGH (ref 0.44–1.00)
GFR, Estimated: 30 mL/min — ABNORMAL LOW (ref >60)
Glucose, Bld: >1200 mg/dL (ref 70–99)
Potassium: 5.1 mmol/L (ref 3.5–5.1)
Sodium: 126 mmol/L — ABNORMAL LOW (ref 135–145)
Total Bilirubin: 0.4 mg/dL (ref 0.3–1.2)
Total Protein: 7.7 g/dL (ref 6.5–8.1)

## 2022-04-27 LAB — IRON AND TIBC
Iron: 34 ug/dL (ref 28–170)
Saturation Ratios: 12 % (ref 10.4–31.8)
TIBC: 286 ug/dL (ref 250–450)
UIBC: 252 ug/dL

## 2022-04-27 LAB — CBG MONITORING, ED
Glucose-Capillary: >600 mg/dL (ref 70–99)
Glucose-Capillary: >600 mg/dL (ref 70–99)
Glucose-Capillary: 549 mg/dL (ref 70–99)
Glucose-Capillary: 378 mg/dL — ABNORMAL HIGH (ref 70–99)
Glucose-Capillary: 176 mg/dL — ABNORMAL HIGH (ref 70–99)

## 2022-04-27 LAB — BASIC METABOLIC PANEL
Anion gap: 8 (ref 5–15)
BUN: 24 mg/dL — ABNORMAL HIGH (ref 8–23)
CO2: 22 mmol/L (ref 22–32)
Calcium: 9 mg/dL (ref 8.9–10.3)
Chloride: 105 mmol/L (ref 98–111)
Creatinine, Ser: 1.63 mg/dL — ABNORMAL HIGH (ref 0.44–1.00)
GFR, Estimated: 35 mL/min — ABNORMAL LOW (ref >60)
Glucose, Bld: 162 mg/dL — ABNORMAL HIGH (ref 70–99)
Potassium: 3.5 mmol/L (ref 3.5–5.1)
Sodium: 135 mmol/L (ref 135–145)

## 2022-04-27 LAB — TROPONIN I (HIGH SENSITIVITY): Troponin I (High Sensitivity): 8 ng/L (ref <18)

## 2022-04-27 LAB — RETICULOCYTES
Immature Retic Fract: 3.9 % (ref 2.3–15.9)
RBC.: 3.24 MIL/uL — ABNORMAL LOW (ref 3.87–5.11)
Retic Count, Absolute: 52.8 10*3/uL (ref 19.0–186.0)
Retic Ct Pct: 1.6 % (ref 0.4–3.1)

## 2022-04-27 LAB — VITAMIN B12: Vitamin B-12: 921 pg/mL — ABNORMAL HIGH (ref 180–914)

## 2022-04-27 LAB — CREATININE, URINE, RANDOM: Creatinine, Urine: 23.89 mg/dL

## 2022-04-27 LAB — TSH: TSH: 1.209 u[IU]/mL (ref 0.350–4.500)

## 2022-04-27 LAB — FERRITIN: Ferritin: 70 ng/mL (ref 11–307)

## 2022-04-27 LAB — CK: Total CK: 35 U/L — ABNORMAL LOW (ref 38–234)

## 2022-04-27 LAB — LIPASE, BLOOD: Lipase: 79 U/L — ABNORMAL HIGH (ref 11–51)

## 2022-04-27 LAB — FOLATE: Folate: 7.8 ng/mL (ref >5.9)

## 2022-04-27 LAB — SODIUM, URINE, RANDOM: Sodium, Ur: 31 mmol/L

## 2022-04-27 LAB — PHOSPHORUS: Phosphorus: 3.2 mg/dL (ref 2.5–4.6)

## 2022-04-27 MED ORDER — INSULIN ASPART 100 UNIT/ML IJ SOLN
4.0000 [IU] | Freq: Once | INTRAMUSCULAR | Status: AC
  Administered 2022-04-27: 4 [IU] via SUBCUTANEOUS
  Filled 2022-04-27: qty 0.04

## 2022-04-27 MED ORDER — GABAPENTIN 300 MG PO CAPS
900.0000 mg | ORAL_CAPSULE | Freq: Every day | ORAL | Status: DC
  Administered 2022-04-27 – 2022-04-30 (×4): 900 mg via ORAL
  Filled 2022-04-27 (×4): qty 3

## 2022-04-27 MED ORDER — ACETAMINOPHEN 325 MG PO TABS
650.0000 mg | ORAL_TABLET | ORAL | Status: DC | PRN
  Administered 2022-05-01: 650 mg via ORAL
  Filled 2022-04-27: qty 2

## 2022-04-27 MED ORDER — ASPIRIN 81 MG PO TBEC
81.0000 mg | DELAYED_RELEASE_TABLET | Freq: Every day | ORAL | Status: DC
  Administered 2022-04-28 – 2022-05-01 (×4): 81 mg via ORAL
  Filled 2022-04-27 (×4): qty 1

## 2022-04-27 MED ORDER — AMITRIPTYLINE HCL 25 MG PO TABS
50.0000 mg | ORAL_TABLET | Freq: Every day | ORAL | Status: DC
  Administered 2022-04-27 – 2022-04-30 (×4): 50 mg via ORAL
  Filled 2022-04-27 (×4): qty 2

## 2022-04-27 MED ORDER — ACETAMINOPHEN 650 MG RE SUPP: 650.0000 mg | RECTAL | Status: DC | PRN

## 2022-04-27 MED ORDER — FENTANYL CITRATE PF 50 MCG/ML IJ SOSY
50.0000 ug | PREFILLED_SYRINGE | Freq: Once | INTRAMUSCULAR | Status: AC
  Administered 2022-04-27: 50 ug via INTRAVENOUS
  Filled 2022-04-27: qty 1

## 2022-04-27 MED ORDER — INSULIN ASPART 100 UNIT/ML IJ SOLN
0.0000 [IU] | INTRAMUSCULAR | Status: DC
  Administered 2022-04-27: 2 [IU] via SUBCUTANEOUS
  Administered 2022-04-28: 1 [IU] via SUBCUTANEOUS
  Administered 2022-04-28: 3 [IU] via SUBCUTANEOUS
  Filled 2022-04-27: qty 0.09

## 2022-04-27 MED ORDER — SODIUM CHLORIDE 0.9 % IV BOLUS
1000.0000 mL | Freq: Once | INTRAVENOUS | Status: AC
  Administered 2022-04-27: 1000 mL via INTRAVENOUS

## 2022-04-27 MED ORDER — SODIUM CHLORIDE 0.9 % IV SOLN
1.0000 g | Freq: Once | INTRAVENOUS | Status: AC
  Administered 2022-04-27: 1 g via INTRAVENOUS
  Filled 2022-04-27: qty 10

## 2022-04-27 MED ORDER — ATORVASTATIN CALCIUM 40 MG PO TABS
40.0000 mg | ORAL_TABLET | Freq: Every day | ORAL | Status: DC
  Administered 2022-04-28 – 2022-05-01 (×4): 40 mg via ORAL
  Filled 2022-04-27 (×4): qty 1

## 2022-04-27 MED ORDER — INSULIN GLARGINE-YFGN 100 UNIT/ML ~~LOC~~ SOLN
15.0000 [IU] | Freq: Every day | SUBCUTANEOUS | Status: DC
  Administered 2022-04-27 – 2022-04-28 (×2): 15 [IU] via SUBCUTANEOUS
  Filled 2022-04-27 (×3): qty 0.15

## 2022-04-27 MED ORDER — STROKE: EARLY STAGES OF RECOVERY BOOK: Freq: Once | Status: DC

## 2022-04-27 MED ORDER — SENNOSIDES-DOCUSATE SODIUM 8.6-50 MG PO TABS: 1.0000 | ORAL_TABLET | Freq: Every evening | ORAL | Status: DC | PRN

## 2022-04-27 MED ORDER — SODIUM CHLORIDE 0.9 % IV SOLN: INTRAVENOUS | Status: DC

## 2022-04-27 MED ORDER — INSULIN ASPART 100 UNIT/ML IJ SOLN
10.0000 [IU] | Freq: Once | INTRAMUSCULAR | Status: AC
  Administered 2022-04-27: 10 [IU] via SUBCUTANEOUS
  Filled 2022-04-27: qty 0.1

## 2022-04-27 MED ORDER — SODIUM CHLORIDE 0.9 % IV SOLN
1.0000 g | INTRAVENOUS | Status: DC
  Administered 2022-04-28 – 2022-04-29 (×2): 1 g via INTRAVENOUS
  Filled 2022-04-27 (×2): qty 10

## 2022-04-27 MED ORDER — ACETAMINOPHEN 160 MG/5ML PO SOLN: 650.0000 mg | ORAL | Status: DC | PRN

## 2022-04-27 NOTE — Assessment & Plan Note (Addendum)
Acute on chronic in the setting of hyperglycemia and dehydration will  Recheck labs Obtain urine electrolytes Given chills, night sweats, weight loss, elevated alk phos And protein in urine  Will order SPEP/UPEP If abnormal will need further work up

## 2022-04-27 NOTE — Assessment & Plan Note (Signed)
-  chronic avoid nephrotoxic medications such as NSAIDs, Vanco Zosyn combo,  avoid hypotension, continue to follow renal function  Acute on chronic in the setting of hyperglycemia and dehydration will  Recheck labs Obtain urine elctrolytes

## 2022-04-27 NOTE — Assessment & Plan Note (Addendum)
Allow permissive HTN for now until CVA work up is complete Resume blood pressure medications as able to tolerate

## 2022-04-27 NOTE — H&P (Signed)
Tricia Huynh HAL:[id number] DOB: 1956-02-05 DOA: 04/27/2022   PCP: Jonathon Jordan, MD   Outpatient Specialists:     GI Mansouraty Otis Brace, MD    Patient arrived to ER on 04/27/22 at 1226 Referred by Attending Hayden Rasmussen, MD   Patient coming from:    home Lives With family    Chief Complaint:   Chief Complaint  Patient presents with   Flank Pain   Emesis    HPI: Tricia Huynh is a 66 y.o. female with medical history significant of DM2,   Anal intraepithelial neoplasia II , anemia, HLD,   Presented with   fatigue Presents with right flank pain nausea vomiting dysuria since yesterday increased urinary frequency On arrival noted to have blood sugar above 1200 Patient was seen in dialysis polydipsia no fevers night sweats and chills though   Does no to smoke does not drink etOH Reports losing weight  She does not know how much Reports night sweats Patient states that when she woke up at first she was having trouble with ambulation Perhaps there was one-sided weakness but she cannot tell for sure which side or if it was any.   Initial COVID     in house  PCR testing  Pending  Lab Results  Component Value Date   [place] NEGATIVE 07/05/2021   [place] NEGATIVE 05/30/2021   [place] NEGATIVE 12/15/2020   [place] NEGATIVE 08/20/2020     Regarding pertinent Chronic problems:     Hyperlipidemia -  on statins Lipitor Lipid Panel     Component Value Date/Time   CHOL 130 06/07/2015 0350   TRIG 63 06/07/2015 0350   HDL 41 06/07/2015 0350   CHOLHDL 3.2 06/07/2015 0350   VLDL 13 06/07/2015 0350   LDLCALC 76 06/07/2015 0350     HTN on [place]   chronic CHF diastolic  - last echo 7353      DM 2 -  Lab Results  Component Value Date   HGBA1C 9.2 (H) 12/16/2020   on insulin,  lled   CKD stage IIIb- baseline Cr 1.3 CrCl cannot be calculated (Unknown ideal weight.).  Lab Results  Component Value Date    CREATININE 1.84 (H) 04/27/2022   CREATININE 1.37 (H) 03/10/2022   CREATININE 1.25 (H) 11/20/2021      Chronic anemia - baseline hg Hemoglobin & Hematocrit  Recent Labs    11/20/21 2301 03/10/22 2041 04/27/22 1401  HGB 10.6* 9.8* 9.2*     While in ER: Clinical Course as of 04/27/22 Eliezer Lofts Apr 27, 2022  1934 Discussed with Dr. Roel Cluck [facility].  She will evaluate patient for admission.  And asking if we can get EKG and chest x-ray to complete work-up. [MB]    Clinical Course User Index [MB] Hayden Rasmussen, MD   Noted to have UTI - started on rocephin Ct reana neg for stone  Blood sugar down to 500    CXR -  NON acute  CTabd/pelvis - No acute abnormality identified in the abdomen or pelvis. No urinary tract calculi. 2. Cholelithiasis.   Following Medications were ordered in ER: Medications  sodium chloride 0.9 % bolus 1,000 mL (1,000 mLs Intravenous New Bag/Given 04/27/22 1650)  insulin aspart (novoLOG) injection 10 Units (10 Units Subcutaneous Given 04/27/22 1656)  fentaNYL (SUBLIMAZE) injection 50 mcg (50 mcg Intravenous Given 04/27/22 1647)  cefTRIAXone (ROCEPHIN) 1 g in sodium chloride 0.9 % 100 mL IVPB (1 g Intravenous New Bag/Given 04/27/22 1818)  sodium chloride 0.9 % bolus 1,000 mL (1,000 mLs Intravenous New Bag/Given 04/27/22 1903)  insulin aspart (novoLOG) injection 4 Units (4 Units Subcutaneous Given 04/27/22 1857)      ED Triage Vitals  Enc Vitals Group     BP 04/27/22 1246 (!) 175/100     Pulse Rate 04/27/22 1246 (!) 104     Resp 04/27/22 1246 18     Temp 04/27/22 1246 98 F (36.7 C)     Temp Source 04/27/22 1246 Oral     SpO2 04/27/22 1246 97 %     Weight --      Height --      Head Circumference --      Peak Flow --      Pain Score 04/27/22 1250 7     Pain Loc --      Pain Edu? --      Excl. in [place]? --   TMAX(24)@     _________________________________________ Significant initial  Findings: Abnormal Labs Reviewed  CBC WITH  DIFFERENTIAL/PLATELET - Abnormal; Notable for the following components:      Result Value   RBC 3.08 (*)    Hemoglobin 9.2 (*)    HCT 27.5 (*)    All other components within normal limits  URINALYSIS, ROUTINE W REFLEX MICROSCOPIC - Abnormal; Notable for the following components:   APPearance CLOUDY (*)    Glucose, UA >=500 (*)    Hgb urine dipstick SMALL (*)    Protein, ur >=300 (*)    Leukocytes,Ua LARGE (*)    Bacteria, UA MANY (*)    All other components within normal limits  COMPREHENSIVE METABOLIC PANEL - Abnormal; Notable for the following components:   Sodium 126 (*)    Chloride 95 (*)    CO2 21 (*)    Glucose, Bld >1,200 (*)    BUN 30 (*)    Creatinine, Ser 1.84 (*)    Albumin 3.0 (*)    AST 14 (*)    Alkaline Phosphatase 208 (*)    GFR, Estimated 30 (*)    All other components within normal limits  LIPASE, BLOOD - Abnormal; Notable for the following components:   Lipase 79 (*)    All other components within normal limits  CBG MONITORING, ED - Abnormal; Notable for the following components:   Glucose-Capillary >600 (*)    All other components within normal limits  CBG MONITORING, ED - Abnormal; Notable for the following components:   Glucose-Capillary >600 (*)    All other components within normal limits  CBG MONITORING, ED - Abnormal; Notable for the following components:   Glucose-Capillary 549 (*)    All other components within normal limits       ECG: Ordered Personally reviewed  and interpreted by me showing: HR : 81 Rhythm: Sinus rhythm Low voltage, extremity leads Non acute QTC 446    The recent clinical data is shown below. Vitals:   04/27/22 1700 04/27/22 1800 04/27/22 1830 04/27/22 1900  BP: (!) 180/101 (!) 163/93 (!) 177/93 (!) 174/107  Pulse: 85 78 78 79  Resp: 13 12 (!) 8 14  Temp:      TempSrc:      SpO2: 100% 100% 100% 100%    WBC     Component Value Date/Time   WBC 6.5 04/27/2022 1401   LYMPHSABS 1.0 04/27/2022 1401   MONOABS  0.3 04/27/2022 1401   EOSABS 0.0 04/27/2022 1401   BASOSABS 0.0 04/27/2022 1401    Lactic  Acid, Venous    Component Value Date/Time   LATICACIDVEN 1.1 01/17/2021 1445     Lactic   Procalcitonin   Ordered   UA  evidence of UTI     Urine analysis:    Component Value Date/Time   COLORURINE YELLOW 04/27/2022 1352   APPEARANCEUR CLOUDY (A) 04/27/2022 1352   LABSPEC 1.021 04/27/2022 1352   PHURINE 5.0 04/27/2022 1352   GLUCOSEU >=500 (A) 04/27/2022 1352   HGBUR SMALL (A) 04/27/2022 1352   BILIRUBINUR NEGATIVE 04/27/2022 1352   KETONESUR NEGATIVE 04/27/2022 1352   PROTEINUR >=300 (A) 04/27/2022 1352   UROBILINOGEN 0.2 09/18/2015 1327   NITRITE NEGATIVE 04/27/2022 1352   LEUKOCYTESUR LARGE (A) 04/27/2022 1352    _______________________________________________ Hospitalist was called for admission for UTI and hyperglycemia   The following Work up has been ordered so far:  Orders Placed This Encounter  Procedures   Urine Culture   CT Renal Stone Study   CBC with Differential   Urinalysis, Routine w reflex microscopic Urine, Clean Catch   Comprehensive metabolic panel   Lipase, blood   Consult to hospitalist   CBG monitoring, ED   CBG monitoring, ED   CBG monitoring, ED     OTHER Significant initial  Findings:  labs showing:  Recent Labs  Lab 04/27/22 1401  NA 126*  K 5.1  CO2 21*  GLUCOSE >1,200*  BUN 30*  CREATININE 1.84*  CALCIUM 9.2    Cr   Up from baseline see below Lab Results  Component Value Date   CREATININE 1.84 (H) 04/27/2022   CREATININE 1.37 (H) 03/10/2022   CREATININE 1.25 (H) 11/20/2021    Recent Labs  Lab 04/27/22 1401  AST 14*  ALT 13  ALKPHOS 208*  BILITOT 0.4  PROT 7.7  ALBUMIN 3.0*   Lab Results  Component Value Date   CALCIUM 9.2 04/27/2022       Plt: Lab Results  Component Value Date   PLT 241 04/27/2022      Venous  Blood Gas result:  7.28 Acid-base deficit 4.1 High  mmol/L  pCO2, Ven 49 mmHg     ABG     Component Value Date/Time   HCO3 22.4 11/02/2019 1706   TCO2 24 11/02/2019 1706   ACIDBASEDEF 4.0 (H) 11/02/2019 1706   O2SAT 67.0 11/02/2019 1706       Recent Labs  Lab 04/27/22 1401  WBC 6.5  NEUTROABS 5.1  HGB 9.2*  HCT 27.5*  MCV 89.3  PLT 241    HG/HCT   stable,      Component Value Date/Time   HGB 9.2 (L) 04/27/2022 1401   HCT 27.5 (L) 04/27/2022 1401   MCV 89.3 04/27/2022 1401     Recent Labs  Lab 04/27/22 1401  LIPASE 79*     DM  labs:  HbA1C: No results for input(s): "HGBA1C" in the last 8760 hours.     CBG (last 3)  Recent Labs    04/27/22 1652 04/27/22 1843 04/27/22 2002  GLUCAP >600* 549* 378*          Cultures:    Component Value Date/Time   SDES URINE, RANDOM 01/11/2021 0026   SPECREQUEST  01/11/2021 0026    NONE Performed at [facility] [address], [place], [place] [address]    CULT 80,000 COLONIES/mL CITROBACTER FREUNDII (A) 01/11/2021 0026   REPTSTATUS 01/13/2021 FINAL 01/11/2021 0026     Radiological Exams on Admission: CT HEAD WO CONTRAST (5MM)  Result Date:  04/27/2022 CLINICAL DATA:  Acute stroke suspected EXAM: CT HEAD WITHOUT CONTRAST TECHNIQUE: Contiguous axial images were obtained from the base of the skull through the vertex without intravenous contrast. RADIATION DOSE REDUCTION: This exam was performed according to the departmental dose-optimization program which includes automated exposure control, adjustment of the mA and/or kV according to patient size and/or use of iterative reconstruction technique. COMPARISON:  04/11/2022 FINDINGS: Brain: No acute intracranial hemorrhage. No focal mass lesion. No CT evidence of acute infarction. No midline shift or mass effect. No hydrocephalus. Basilar cisterns are patent. Vascular: No hyperdense vessel or unexpected calcification. Skull: Normal. Negative for fracture or focal lesion. Sinuses/Orbits: Paranasal sinuses and mastoid air cells are clear. Orbits are clear. Other:  Ovoid cystic lesion within the scalp posterior to the LEFT parietal bone is unchanged. (Image 16/2). IMPRESSION: 1. No acute intracranial findings. 2. No change from comparison exam Electronically Signed   By: Suzy Bouchard M.D.   On: 04/27/2022 20:40   DG Chest Port 1 View  Result Date: 04/27/2022 CLINICAL DATA:  Weakness.  Burning urination EXAM: PORTABLE CHEST 1 VIEW COMPARISON:  CT 01/25/2022 FINDINGS: Normal mediastinum and cardiac silhouette. Normal pulmonary vasculature. No evidence of effusion, infiltrate, or pneumothorax. No acute bony abnormality. IMPRESSION: No acute cardiopulmonary process. Electronically Signed   By: Suzy Bouchard M.D.   On: 04/27/2022 19:47   CT Renal Stone Study  Result Date: 04/27/2022 CLINICAL DATA:  Right flank pain, kidney stone suspected. EXAM: CT ABDOMEN AND PELVIS WITHOUT CONTRAST TECHNIQUE: Multidetector CT imaging of the abdomen and pelvis was performed following the standard protocol without IV contrast. RADIATION DOSE REDUCTION: This exam was performed according to the departmental dose-optimization program which includes automated exposure control, adjustment of the mA and/or kV according to patient size and/or use of iterative reconstruction technique. COMPARISON:  CT abdomen and pelvis 06/23/2021 FINDINGS: Lower chest: No basilar lung consolidation or pleural effusion. Hepatobiliary: No focal liver abnormality identified on this unenhanced study. Tiny layering stones in the gallbladder. No biliary dilatation. Pancreas: Unremarkable. Spleen: Unremarkable. Adrenals/Urinary Tract: Unremarkable adrenal glands. 1 cm hypodensity posteriorly in the lower pole of the left kidney compatible with a cyst. Subcentimeter hyperdense lesions in both kidneys, 2 on the right and 1 on the left, incompletely evaluated but likely reflecting proteinaceous or hemorrhagic cysts with the right-sided lesions also being faintly visible on the prior study. No renal or ureteral  calculi or hydronephrosis. Unremarkable bladder. Stomach/Bowel: The stomach is unremarkable. There is no evidence of bowel obstruction or inflammation. The appendix is unremarkable. Vascular/Lymphatic: Minimal abdominal aortic atherosclerosis without aneurysm. No enlarged lymph nodes. Reproductive: Status post hysterectomy. No adnexal masses. Other: Likely trace pelvic free fluid. Musculoskeletal: No acute osseous abnormality or suspicious osseous lesion. IMPRESSION: 1. No acute abnormality identified in the abdomen or pelvis. No urinary tract calculi. 2. Cholelithiasis. 3. Aortic Atherosclerosis (ICD10-I70.0). Electronically Signed   By: Logan Bores M.D.   On: 04/27/2022 14:39   _______________________________________________________________________________________________________ Latest  Blood pressure (!) 174/107, pulse 79, temperature 98 F (36.7 C), temperature source Oral, resp. rate 14, SpO2 100 %.   Vitals  labs and radiology finding personally reviewed  Review of Systems:    Pertinent positives include:  chills, fatigue, weight loss  bdominal pain, nausea, vomiting dysuria,  weakness,    gait abnormality,  Constitutional:  No weight loss, night sweats, Fevers,  HEENT:  No headaches, Difficulty swallowing,Tooth/dental problems,Sore throat,  No sneezing, itching, ear ache, nasal congestion, post nasal drip,  Cardio-vascular:  No chest  pain, Orthopnea, PND, anasarca, dizziness, palpitations.no Bilateral lower extremity swelling  GI:  No heartburn, indigestion, a, diarrhea, change in bowel habits, loss of appetite, melena, blood in stool, hematemesis Resp:  no shortness of breath at rest. No dyspnea on exertion, No excess mucus, no productive cough, No non-productive cough, No coughing up of blood.No change in color of mucus.No wheezing. Skin:  no rash or lesions. No jaundice GU:  no change in color of urine, no urgency or frequency. No straining to urinate.  No flank pain.   Musculoskeletal:  No joint pain or no joint swelling. No decreased range of motion. No back pain.  Psych:  No change in mood or affect. No depression or anxiety. No memory loss.  Neuro:  no tingling, nno slurred speech, no confusion  All systems reviewed and apart from [place] all are negative _______________________________________________________________________________________________ Past Medical History:   Past Medical History:  Diagnosis Date   Diabetes mellitus type 2 in nonobese ([place]) 06/06/2015   Diabetes mellitus without complication ([place])    Diabetic neuropathy ([place]) 06/06/2015   Dyslipidemia 06/06/2015   Encephalopathy    Essential hypertension 06/06/2015   Hypertension    Mood disorder ([place]) 06/06/2015   Pneumonia 03/11/2017   Seizure ([place])    sees [name]. abd eeg, on keppra     Past Surgical History:  Procedure Laterality Date   ABDOMINAL HYSTERECTOMY     EYE SURGERY Bilateral    TUBAL LIGATION      Social History:  Ambulatory   independently       reports that she has quit smoking. Her smoking use included cigarettes. She has never used smokeless tobacco. She reports that she does not drink alcohol and does not use drugs.     Family History:   Family History  Problem Relation Age of Onset   Heart disease Father    Kidney disease Father    Diabetes Mother    Seizures Sister    Seizures Brother    ______________________________________________________________________________________________ Allergies: Allergies  Allergen Reactions   Percocet [Oxycodone-Acetaminophen] Nausea And Vomiting and Other (See Comments)    Causes the patient to be shaky/unsteady, also   Vicodin [Hydrocodone-Acetaminophen] Nausea And Vomiting and Other (See Comments)    Causes the patient to be shaky/unsteady, also     Prior to Admission medications   Medication Sig Start Date End Date Taking? Authorizing Provider  Accu-Chek Softclix Lancets lancets 1 each by Other route 2  (two) times daily. E11.9 12/20/19  Yes Renato Shin, MD  acetaminophen (TYLENOL) 500 MG tablet Take 1,000 mg by mouth every 6 (six) hours as needed for mild pain or headache.   Yes [provider]  amitriptyline (ELAVIL) 50 MG tablet Take 50 mg by mouth daily. 05/08/15  Yes [provider]  amLODipine-olmesartan (AZOR) 5-40 MG tablet Take 1 tablet by mouth daily. 10/08/16  Yes [provider]  aspirin EC 81 MG tablet Take 81 mg by mouth daily.   Yes [provider]  atorvastatin (LIPITOR) 40 MG tablet Take 40 mg by mouth daily. 12/23/19  Yes [provider]  Blood Glucose Monitoring Suppl (ACCU-CHEK AVIVA PLUS) w/Device KIT 1 each by Does not apply route 2 (two) times daily. E11.9 12/20/19  Yes Renato Shin, MD  gabapentin (NEURONTIN) 100 MG capsule Take 900 mg by mouth at bedtime. 12/23/19  Yes [provider]  glucose blood (ACCU-CHEK AVIVA PLUS) test strip 1 each by Other route 2 (two) times daily. E11.9 12/20/19  Yes Loanne Drilling,  Hilliard Clark, MD  hydrocortisone (ANUSOL-HC) 2.5 % rectal cream Apply 1 application topically 2 (two) times daily. 03/25/21  Yes [provider]  Insulin Glargine (LANTUS SOLOSTAR) 100 UNIT/ML Solostar Pen Inject 24 Units into the skin every morning. And pen needles 1/day Patient taking differently: Inject 20 Units into the skin 2 (two) times daily. 12/20/19  Yes Renato Shin, MD  ondansetron (ZOFRAN ODT) 4 MG disintegrating tablet 71m ODT q4 hours prn nausea/vomit Patient not taking: Reported on 04/27/2022 06/23/21   YDrenda Freeze MD  ondansetron (ZOFRAN) 4 MG tablet Take 1 tablet (4 mg total) by mouth every 6 (six) hours. Patient not taking: Reported on 04/27/2022 07/24/21   MValarie Merino MD    ___________________________________________________________________________________________________ Physical Exam:    04/27/2022    7:00 PM 04/27/2022    6:30 PM 04/27/2022    6:00 PM  Vitals with BMI  Systolic [id number]  Diastolic [id number] 93  Pulse 79 78 78     1. General:  in No  Acute distress   Chronically ill   -appearing 2. Psychological: Alert and   Oriented 3. Head/ENT:    dry Mucous Membranes                          Head Non traumatic, neck supple                           Poor Dentition 4. SKIN: decreased Skin turgor,  Skin clean Dry and intact no rash 5. Heart: Regular rate and rhythm no  Murmur, no Rub or gallop 6. Lungs  no wheezes or crackles   7. Abdomen: Soft,  non-tender, Non distended bowel sounds present 8. Lower extremities: no clubbing, cyanosis, no  edema 9. Neurologically slight diminished grip on the left, CN 2-12 intact, intact sensation After reassessment few minutes later appears to be now neurologically intact blood sugar down to 170s And overall feeling much better.  Unclear if prior neurological brief symptoms were secondary to hyperglycemia versus poor effort 10. MSK: Normal range of motion    Chart has been reviewed  ______________________________________________________________________________________________  Assessment/Plan  66y.o. female with medical history significant of DM2,   Anal intraepithelial neoplasia II , anemia, HLD,     Admitted for UTI , hyperglycemia   Present on Admission:  Benign essential HTN  CKD (chronic kidney disease) stage 3, GFR 30-59 ml/min (HCC)  Acute cystitis without hematuria  Acute renal failure (HCC)  Diabetes mellitus with complication (HCC)  Hyperglycemia  Focal neurological deficit  Scalp cyst  Chronic diastolic CHF (congestive heart failure) (HCC)     Benign essential HTN Allow permissive HTN for now until CVA work up is complete Resume blood pressure medications as able to tolerate  CKD (chronic kidney disease) stage 3, GFR 30-59 ml/min (HCC)  -chronic avoid nephrotoxic medications such as NSAIDs, Vanco Zosyn combo,  avoid hypotension, continue to follow renal function  Acute on chronic in the setting of  hyperglycemia and dehydration will  Recheck labs Obtain urine elctrolytes  Acute cystitis without hematuria  - treat with Rocephin        await results of urine culture and adjust antibiotic coverage as needed CT renal no abcess   Acute renal failure (HCC) Acute on chronic in the setting of hyperglycemia and dehydration will  Recheck labs Obtain urine electrolytes Given chills, night sweats, weight loss, elevated alk phos And  protein in urine  Will order SPEP/UPEP If abnormal will need further work up  Diabetes mellitus with complication ([place]) Hyperglycemia initially severe Improved with iv fluids and insulin doses Improved < 400 Will continue SSI  restart home insulin for now at 15 units qhs Will need diabetic coordinator consult   Hyperglycemia In the setting of infection  Now improving with IV fluid rehydration and insulin SQ doses No evidence of DKA  Focal neurological deficit Pt states she woke up feeling weak but unsure on what side  she tried to ambulate but was unstable Took a shower and set down had an episodes of emesisis and EMS was called  so she is unsure if she still had any problems with balance Obtain CT head and MRI to further evaluate pt is out of window for intervention bc she woke up in AM symptomatic If abnormal studies will need neurology consult  10:42 PM CT head unremarkable MRI brain unremarkable.  Discussed with neurology at night who recommended finishing up work-up with echo carotid Dopplers, MRA of the head without contrast Lipid panel hemoglobin A1c Agrees most likely focal neurological complaints were secondary to hyperglycemia.  If any work-up is abnormal please obtain official neurology consult   Scalp cyst Pt has known scalp cyst that is followed by gen Surgery plan to remove on June [id number]  Chronic diastolic CHF (congestive heart failure) ([place]) Appears slightly fluid down at this time  Cont to monitor Avoid over aggressive fluid  resuscitation    Other plan as per orders.  DVT prophylaxis:  SCD       Code Status:    Code Status: Prior FULL CODE    as per patient  family  I had personally discussed CODE STATUS with patient      Family Communication:   Family not at  Bedside    Disposition Plan:        To home once workup is complete and patient is stable   Following barriers for discharge:                            Electrolytes corrected                               Anemia  stable                              white count improving able to transition to PO antibiotics                             Will need to be able to tolerate PO                        Would benefit from PT/OT eval prior to DC  Ordered                   Swallow eval - SLP ordered                   Diabetes care coordinator                   Transition of care consulted                   Nutrition  consulted                                      Consults called: if MRA abnormal will need neurology consult  Discussed case briefly at night but no official consult for right now reconsult if needed  Admission status:  ED Disposition     ED Disposition  [place]: [place] [[id number]]  Level of Care: Progressive [102]  Admit to Progressive based on following criteria: GI, ENDOCRINE disease patients with GI bleeding, acute liver failure or pancreatitis, stable with diabetic ketoacidosis or thyrotoxicosis (hypothyroid) state.  May place patient in observation at [facility] or [place] if equivalent level of care is available:: No  Covid Evaluation: Asymptomatic - no recent exposure (last 10 days) testing not required  Diagnosis: Hyperglycemia due to type 2 diabetes mellitus [facility]) [[id number]]  Admitting Physician: Toy Baker [[id number]]  Attending Physician: Toy Baker [[id number]]           Obs     Level of care   stepdown indefinitely please discontinue once  patient no longer qualifies COVID-19 Labs    Lab Results  Component Value Date   [place] NEGATIVE 07/05/2021      Tricia Huynh 04/27/2022, 10:45 PM    [facility]     after 2 AM please page floor coverage PA If 7AM-7PM, please contact the day team taking care of the patient using [email or web address]   Patient was evaluated in the context of the global COVID-19 pandemic, which necessitated consideration that the patient might be at risk for infection with the SARS-CoV-2 virus that causes COVID-19. Institutional protocols and algorithms that pertain to the evaluation of patients at risk for COVID-19 are in a state of rapid change based on information released by regulatory bodies including the CDC and federal and state organizations. These policies and algorithms were followed during the patient's care.

## 2022-04-27 NOTE — ED Notes (Signed)
Lab is adding on urine test

## 2022-04-27 NOTE — Subjective & Objective (Signed)
Presents with right flank pain nausea vomiting dysuria since yesterday increased urinary frequency On arrival noted to have blood sugar above 1200 Patient was seen in dialysis polydipsia no fevers night sweats and chills though

## 2022-04-27 NOTE — ED Triage Notes (Signed)
Pt reports right flank pain, N/V, burning when urinating since yesterday. Pt also reports urinary frequency

## 2022-04-27 NOTE — ED Notes (Signed)
Critical glucose >1200  Reported to S. Blue

## 2022-04-27 NOTE — ED Notes (Signed)
MD at bedside, notified nurse to hold off on transport upstairs pending further assessment.

## 2022-04-27 NOTE — Assessment & Plan Note (Signed)
Pt has known scalp cyst that is followed by gen Surgery plan to remove on June 20th

## 2022-04-27 NOTE — Assessment & Plan Note (Addendum)
Pt states she woke up feeling weak but unsure on what side  she tried to ambulate but was unstable Took a shower and set down had an episodes of emesisis and EMS was called  so she is unsure if she still had any problems with balance Obtain CT head and MRI to further evaluate pt is out of window for intervention bc she woke up in AM symptomatic If abnormal studies will need neurology consult  10:42 PM CT head unremarkable MRI brain unremarkable.  Discussed with neurology at night who recommended finishing up work-up with echo carotid Dopplers, MRA of the head without contrast Lipid panel hemoglobin A1c Agrees most likely focal neurological complaints were secondary to hyperglycemia.  If any work-up is abnormal please obtain official neurology consult

## 2022-04-27 NOTE — Assessment & Plan Note (Signed)
Hyperglycemia initially severe Improved with iv fluids and insulin doses Improved < 400 Will continue SSI  restart home insulin for now at 15 units qhs Will need diabetic coordinator consult

## 2022-04-27 NOTE — Assessment & Plan Note (Signed)
In the setting of infection  Now improving with IV fluid rehydration and insulin SQ doses No evidence of DKA

## 2022-04-27 NOTE — ED Provider Triage Note (Signed)
Emergency Medicine Provider Triage Evaluation Note  Tamura Lasky , a 66 y.o. female  was evaluated in triage.  Pt complains of right-sided flank pain has been intermittent over the last couple of weeks.  Pain radiates into the right lower quadrant.  She reports associated urinary frequency and dysuria.  Also complaining of polydipsia but denies fever, diarrhea.  She states she has been having some night sweats and chills.  Review of Systems  Positive:  Negative: See above   Physical Exam  BP (!) 175/100 (BP Location: Right Arm)   Pulse (!) 104   Temp 98 F (36.7 C) (Oral)   Resp 18   SpO2 97%  Gen:   Awake, no distress   Resp:  Normal effort  MSK:   Moves extremities without difficulty  Other:  Right-sided CVA tenderness  Medical Decision Making  Medically screening exam initiated at 1:52 PM.  Appropriate orders placed.  [name] was informed that the remainder of the evaluation will be completed by another provider, this initial triage assessment does not replace that evaluation, and the importance of remaining in the ED until their evaluation is complete.     Myna [facility], [place] 04/27/22 1353

## 2022-04-27 NOTE — ED Notes (Signed)
Pt back from MRI 

## 2022-04-27 NOTE — Assessment & Plan Note (Signed)
Appears slightly fluid down at this time  Cont to monitor Avoid over aggressive fluid resuscitation

## 2022-04-27 NOTE — Assessment & Plan Note (Signed)
-   treat with Rocephin        await results of urine culture and adjust antibiotic coverage as needed CT renal no abcess

## 2022-04-27 NOTE — ED Provider Notes (Signed)
[facility] DEPT Provider Note   CSN: [id number] Arrival date & time: 04/27/22  1226     History {Add pertinent medical, surgical, social history, OB history to HPI:1} Chief Complaint  Patient presents with   Flank Pain   Emesis    Tricia Huynh is a 66 y.o. female.  She has a history of diabetes, CKD, seizures.  She is complaining of some right sided flank abdominal pain that is been going on for a few weeks.  Over the last 2 days she has had increased thirst increased urination and 2 episodes of vomiting nonbloody nonbilious.  No fevers or chills no chest pain or shortness of breath.  Has been feeling very fatigued.  No trauma.  Blood sugars usually run between 101-140.  Possibly some burning with urination.  The history is provided by the patient.  Flank Pain This is a recurrent problem. The current episode started more than 1 week ago. The problem occurs constantly. The problem has not changed since onset.Associated symptoms include abdominal pain. Pertinent negatives include no chest pain, no headaches and no shortness of breath. The symptoms are aggravated by twisting and bending. Nothing relieves the symptoms. She has tried rest and water for the symptoms. The treatment provided no relief.  Emesis Associated symptoms: abdominal pain   Associated symptoms: no fever and no headaches        Home Medications Prior to Admission medications   Medication Sig Start Date End Date Taking? Authorizing Provider  Accu-Chek Softclix Lancets lancets 1 each by Other route 2 (two) times daily. E11.9 12/20/19   Renato Shin, MD  acetaminophen (TYLENOL) 500 MG tablet Take 1,000 mg by mouth every 6 (six) hours as needed for mild pain or headache.    [provider]  amitriptyline (ELAVIL) 50 MG tablet Take 50 mg by mouth daily. 05/08/15   [provider]  amLODipine-olmesartan (AZOR) 5-40 MG tablet Take 1 tablet by mouth daily. 10/08/16    [provider]  aspirin EC 81 MG tablet Take 81 mg by mouth daily.    [provider]  atorvastatin (LIPITOR) 40 MG tablet Take 40 mg by mouth daily. 12/23/19   [provider]  Blood Glucose Monitoring Suppl (ACCU-CHEK AVIVA PLUS) w/Device KIT 1 each by Does not apply route 2 (two) times daily. E11.9 12/20/19   Renato Shin, MD  gabapentin (NEURONTIN) 100 MG capsule Take 100 mg by mouth 3 (three) times daily. 12/23/19   [provider]  glucose blood (ACCU-CHEK AVIVA PLUS) test strip 1 each by Other route 2 (two) times daily. E11.9 12/20/19   Renato Shin, MD  hydrocortisone (ANUSOL-HC) 2.5 % rectal cream Apply 1 application topically 2 (two) times daily. 03/25/21   [provider]  Insulin Glargine (LANTUS SOLOSTAR) 100 UNIT/ML Solostar Pen Inject 24 Units into the skin every morning. And pen needles 1/day Patient taking differently: Inject 20 Units into the skin 2 (two) times daily. 12/20/19   Renato Shin, MD  levETIRAcetam (KEPPRA) 500 MG tablet Take 1 tablet (500 mg total) by mouth 2 (two) times daily. 02/10/21 05/30/21  Fatima Blank, MD  ondansetron (ZOFRAN ODT) 4 MG disintegrating tablet 3m ODT q4 hours prn nausea/vomit 06/23/21   YDrenda Freeze MD  ondansetron (ZOFRAN) 4 MG tablet Take 1 tablet (4 mg total) by mouth every 8 (eight) hours as needed for nausea or vomiting. Patient not taking: No sig reported 12/19/20   AShelly Coss MD  ondansetron ([facility] 4  MG tablet Take 1 tablet (4 mg total) by mouth every 6 (six) hours. 07/24/21   Valarie Merino, MD  promethazine (PHENERGAN) 25 MG suppository Place 1 suppository (25 mg total) rectally every 6 (six) hours as needed for nausea or vomiting. 07/24/21   Valarie Merino, MD  promethazine (PHENERGAN) 25 MG tablet Take 1 tablet (25 mg total) by mouth every 8 (eight) hours as needed for up to 6 doses for refractory nausea / vomiting. 07/05/21   Wyvonnia Dusky, MD  sertraline (ZOLOFT) 50 MG tablet  Take 50 mg by mouth daily. 01/07/19   [provider]  tamsulosin (FLOMAX) 0.4 MG CAPS capsule Take 1 capsule (0.4 mg total) by mouth daily. 06/23/21   Drenda Freeze, MD  traMADol (ULTRAM) 50 MG tablet Take 1 tablet (50 mg total) by mouth every 6 (six) hours as needed for moderate pain. 06/23/21   Drenda Freeze, MD      Allergies    Percocet [oxycodone-acetaminophen] and Vicodin [hydrocodone-acetaminophen]    Review of Systems   Review of Systems  Constitutional:  Positive for fatigue. Negative for fever.  Eyes:  Negative for visual disturbance.  Respiratory:  Negative for shortness of breath.   Cardiovascular:  Negative for chest pain.  Gastrointestinal:  Positive for abdominal pain, nausea and vomiting.  Endocrine: Positive for polydipsia and polyuria.  Genitourinary:  Positive for flank pain and frequency.  Musculoskeletal:  Positive for back pain.  Skin:  Negative for rash.  Neurological:  Negative for headaches.    Physical Exam Updated Vital Signs BP (!) 175/100 (BP Location: Right Arm)   Pulse (!) 104   Temp 98 F (36.7 C) (Oral)   Resp 18   SpO2 97%  Physical Exam Vitals and nursing note reviewed.  Constitutional:      General: She is not in acute distress.    Appearance: Normal appearance. She is well-developed.  HENT:     Head: Normocephalic and atraumatic.  Eyes:     Conjunctiva/sclera: Conjunctivae normal.  Cardiovascular:     Rate and Rhythm: Regular rhythm. Tachycardia present.     Heart sounds: No murmur heard. Pulmonary:     Effort: Pulmonary effort is normal. No respiratory distress.     Breath sounds: Normal breath sounds.  Abdominal:     Palpations: Abdomen is soft.     Tenderness: There is no abdominal tenderness. There is no guarding or rebound.  Musculoskeletal:        General: No deformity or signs of injury. Normal range of motion.     Cervical back: Neck supple.  Skin:    General: Skin is warm and dry.     Capillary Refill:  Capillary refill takes less than 2 seconds.  Neurological:     General: No focal deficit present.     Mental Status: She is alert.     ED Results / Procedures / Treatments   Labs (all labs ordered are listed, but only abnormal results are displayed) Labs Reviewed  CBC WITH DIFFERENTIAL/PLATELET - Abnormal; Notable for the following components:      Result Value   RBC 3.08 (*)    Hemoglobin 9.2 (*)    HCT 27.5 (*)    All other components within normal limits  COMPREHENSIVE METABOLIC PANEL - Abnormal; Notable for the following components:   Sodium 126 (*)    Chloride 95 (*)    CO2 21 (*)    Glucose, Bld >1,200 (*)    BUN  30 (*)    Creatinine, Ser 1.84 (*)    Albumin 3.0 (*)    AST 14 (*)    Alkaline Phosphatase 208 (*)    GFR, Estimated 30 (*)    All other components within normal limits  LIPASE, BLOOD - Abnormal; Notable for the following components:   Lipase 79 (*)    All other components within normal limits  CBG MONITORING, ED - Abnormal; Notable for the following components:   Glucose-Capillary >600 (*)    All other components within normal limits  URINALYSIS, ROUTINE W REFLEX MICROSCOPIC    EKG None  Radiology CT Renal Stone Study  Result Date: 04/27/2022 CLINICAL DATA:  Right flank pain, kidney stone suspected. EXAM: CT ABDOMEN AND PELVIS WITHOUT CONTRAST TECHNIQUE: Multidetector CT imaging of the abdomen and pelvis was performed following the standard protocol without IV contrast. RADIATION DOSE REDUCTION: This exam was performed according to the departmental dose-optimization program which includes automated exposure control, adjustment of the mA and/or kV according to patient size and/or use of iterative reconstruction technique. COMPARISON:  CT abdomen and pelvis 06/23/2021 FINDINGS: Lower chest: No basilar lung consolidation or pleural effusion. Hepatobiliary: No focal liver abnormality identified on this unenhanced study. Tiny layering stones in the gallbladder.  No biliary dilatation. Pancreas: Unremarkable. Spleen: Unremarkable. Adrenals/Urinary Tract: Unremarkable adrenal glands. 1 cm hypodensity posteriorly in the lower pole of the left kidney compatible with a cyst. Subcentimeter hyperdense lesions in both kidneys, 2 on the right and 1 on the left, incompletely evaluated but likely reflecting proteinaceous or hemorrhagic cysts with the right-sided lesions also being faintly visible on the prior study. No renal or ureteral calculi or hydronephrosis. Unremarkable bladder. Stomach/Bowel: The stomach is unremarkable. There is no evidence of bowel obstruction or inflammation. The appendix is unremarkable. Vascular/Lymphatic: Minimal abdominal aortic atherosclerosis without aneurysm. No enlarged lymph nodes. Reproductive: Status post hysterectomy. No adnexal masses. Other: Likely trace pelvic free fluid. Musculoskeletal: No acute osseous abnormality or suspicious osseous lesion. IMPRESSION: 1. No acute abnormality identified in the abdomen or pelvis. No urinary tract calculi. 2. Cholelithiasis. 3. Aortic Atherosclerosis (ICD10-I70.0). Electronically Signed   By: Logan Bores M.D.   On: 04/27/2022 14:39    Procedures Procedures  {Document cardiac monitor, telemetry assessment procedure when appropriate:1}  Medications Ordered in ED Medications  sodium chloride 0.9 % bolus 1,000 mL (has no administration in time range)  insulin aspart (novoLOG) injection 10 Units (has no administration in time range)  fentaNYL (SUBLIMAZE) injection 50 mcg (has no administration in time range)    ED Course/ Medical Decision Making/ A&P                           Medical Decision Making Risk Prescription drug management.  This patient complains of ***; this involves an extensive number of treatment Options and is a complaint that carries with it a high risk of complications and morbidity. The differential includes ***  I ordered, reviewed and interpreted labs, which  included *** I ordered medication *** and reviewed PMP when indicated. I ordered imaging studies which included *** and I independently    visualized and interpreted imaging which showed *** Additional history obtained from *** Previous records obtained and reviewed *** I consulted *** and discussed lab and imaging findings and discussed disposition.  Cardiac monitoring reviewed, *** Social determinants considered, *** Critical Interventions: ***  After the interventions stated above, I reevaluated the patient and found *** Admission and further  testing considered, ***    {Document critical care time when appropriate:1} {Document review of labs and clinical decision tools ie heart score, Chads2Vasc2 etc:1}  {Document your independent review of radiology images, and any outside records:1} {Document your discussion with family members, caretakers, and with consultants:1} {Document social determinants of health affecting pt's care:1} {Document your decision making why or why not admission, treatments were needed:1} Final Clinical Impression(s) / ED Diagnoses Final diagnoses:  None    Rx / DC Orders ED Discharge Orders     None

## 2022-04-28 ENCOUNTER — Observation Stay (HOSPITAL_COMMUNITY): Payer: 59

## 2022-04-28 ENCOUNTER — Other Ambulatory Visit: Payer: Self-pay

## 2022-04-28 DIAGNOSIS — Z833 Family history of diabetes mellitus: Secondary | ICD-10-CM | POA: Diagnosis not present

## 2022-04-28 DIAGNOSIS — R739 Hyperglycemia, unspecified: Secondary | ICD-10-CM | POA: Diagnosis not present

## 2022-04-28 DIAGNOSIS — Z20822 Contact with and (suspected) exposure to covid-19: Secondary | ICD-10-CM | POA: Diagnosis not present

## 2022-04-28 DIAGNOSIS — N179 Acute kidney failure, unspecified: Secondary | ICD-10-CM | POA: Diagnosis not present

## 2022-04-28 DIAGNOSIS — Z7982 Long term (current) use of aspirin: Secondary | ICD-10-CM | POA: Diagnosis not present

## 2022-04-28 DIAGNOSIS — E860 Dehydration: Secondary | ICD-10-CM | POA: Diagnosis present

## 2022-04-28 DIAGNOSIS — I10 Essential (primary) hypertension: Secondary | ICD-10-CM | POA: Diagnosis not present

## 2022-04-28 DIAGNOSIS — E11649 Type 2 diabetes mellitus with hypoglycemia without coma: Secondary | ICD-10-CM | POA: Diagnosis not present

## 2022-04-28 DIAGNOSIS — N3000 Acute cystitis without hematuria: Secondary | ICD-10-CM | POA: Diagnosis present

## 2022-04-28 DIAGNOSIS — N1832 Chronic kidney disease, stage 3b: Secondary | ICD-10-CM | POA: Diagnosis present

## 2022-04-28 DIAGNOSIS — R109 Unspecified abdominal pain: Secondary | ICD-10-CM | POA: Diagnosis present

## 2022-04-28 DIAGNOSIS — E1122 Type 2 diabetes mellitus with diabetic chronic kidney disease: Secondary | ICD-10-CM | POA: Diagnosis not present

## 2022-04-28 DIAGNOSIS — Z79899 Other long term (current) drug therapy: Secondary | ICD-10-CM | POA: Diagnosis not present

## 2022-04-28 DIAGNOSIS — N10 Acute tubulo-interstitial nephritis: Secondary | ICD-10-CM | POA: Diagnosis present

## 2022-04-28 DIAGNOSIS — R269 Unspecified abnormalities of gait and mobility: Secondary | ICD-10-CM | POA: Diagnosis not present

## 2022-04-28 DIAGNOSIS — E1140 Type 2 diabetes mellitus with diabetic neuropathy, unspecified: Secondary | ICD-10-CM | POA: Diagnosis present

## 2022-04-28 DIAGNOSIS — Z794 Long term (current) use of insulin: Secondary | ICD-10-CM | POA: Diagnosis not present

## 2022-04-28 DIAGNOSIS — E440 Moderate protein-calorie malnutrition: Secondary | ICD-10-CM | POA: Diagnosis not present

## 2022-04-28 DIAGNOSIS — Z90710 Acquired absence of both cervix and uterus: Secondary | ICD-10-CM | POA: Diagnosis not present

## 2022-04-28 DIAGNOSIS — E785 Hyperlipidemia, unspecified: Secondary | ICD-10-CM | POA: Diagnosis present

## 2022-04-28 DIAGNOSIS — D649 Anemia, unspecified: Secondary | ICD-10-CM | POA: Diagnosis not present

## 2022-04-28 DIAGNOSIS — R29818 Other symptoms and signs involving the nervous system: Secondary | ICD-10-CM | POA: Diagnosis not present

## 2022-04-28 DIAGNOSIS — Z681 Body mass index (BMI) 19 or less, adult: Secondary | ICD-10-CM | POA: Diagnosis not present

## 2022-04-28 DIAGNOSIS — I5032 Chronic diastolic (congestive) heart failure: Secondary | ICD-10-CM | POA: Diagnosis not present

## 2022-04-28 DIAGNOSIS — Z87891 Personal history of nicotine dependence: Secondary | ICD-10-CM | POA: Diagnosis not present

## 2022-04-28 DIAGNOSIS — I130 Hypertensive heart and chronic kidney disease with heart failure and stage 1 through stage 4 chronic kidney disease, or unspecified chronic kidney disease: Secondary | ICD-10-CM | POA: Diagnosis not present

## 2022-04-28 DIAGNOSIS — B961 Klebsiella pneumoniae [K. pneumoniae] as the cause of diseases classified elsewhere: Secondary | ICD-10-CM | POA: Diagnosis not present

## 2022-04-28 DIAGNOSIS — N170 Acute kidney failure with tubular necrosis: Secondary | ICD-10-CM | POA: Diagnosis not present

## 2022-04-28 DIAGNOSIS — R64 Cachexia: Secondary | ICD-10-CM | POA: Diagnosis not present

## 2022-04-28 DIAGNOSIS — E872 Acidosis, unspecified: Secondary | ICD-10-CM | POA: Diagnosis not present

## 2022-04-28 LAB — OSMOLALITY: Osmolality: 296 mOsm/kg — ABNORMAL HIGH (ref 275–295)

## 2022-04-28 LAB — LIPASE, BLOOD: Lipase: 40 U/L (ref 11–51)

## 2022-04-28 LAB — GLUCOSE, CAPILLARY
Glucose-Capillary: 140 mg/dL — ABNORMAL HIGH (ref 70–99)
Glucose-Capillary: 187 mg/dL — ABNORMAL HIGH (ref 70–99)
Glucose-Capillary: 201 mg/dL — ABNORMAL HIGH (ref 70–99)
Glucose-Capillary: 220 mg/dL — ABNORMAL HIGH (ref 70–99)
Glucose-Capillary: 306 mg/dL — ABNORMAL HIGH (ref 70–99)
Glucose-Capillary: 313 mg/dL — ABNORMAL HIGH (ref 70–99)
Glucose-Capillary: 64 mg/dL — ABNORMAL LOW (ref 70–99)
Glucose-Capillary: 75 mg/dL (ref 70–99)

## 2022-04-28 LAB — ECHOCARDIOGRAM COMPLETE
AR max vel: 2.67 cm2
AV Peak grad: 7.3 mmHg
Ao pk vel: 1.36 m/s
Area-P 1/2: 3.27 cm2
S' Lateral: 2.4 cm

## 2022-04-28 LAB — HEMOGLOBIN A1C
Hgb A1c MFr Bld: 10.3 % — ABNORMAL HIGH (ref 4.8–5.6)
Mean Plasma Glucose: 248.91 mg/dL

## 2022-04-28 LAB — LIPID PANEL
Cholesterol: 226 mg/dL — ABNORMAL HIGH (ref 0–200)
HDL: 53 mg/dL (ref >40)
LDL Cholesterol: 146 mg/dL — ABNORMAL HIGH (ref 0–99)
Total CHOL/HDL Ratio: 4.3 RATIO
Triglycerides: 134 mg/dL (ref <150)
VLDL: 27 mg/dL (ref 0–40)

## 2022-04-28 LAB — OSMOLALITY, URINE: Osmolality, Ur: 451 mOsm/kg (ref 300–900)

## 2022-04-28 LAB — TROPONIN I (HIGH SENSITIVITY)
Troponin I (High Sensitivity): 8 ng/L (ref <18)
Troponin I (High Sensitivity): 7 ng/L (ref <18)

## 2022-04-28 LAB — ABO/RH: ABO/RH(D): O POS

## 2022-04-28 LAB — HIV ANTIBODY (ROUTINE TESTING W REFLEX): HIV Screen 4th Generation wRfx: NONREACTIVE

## 2022-04-28 LAB — PHOSPHORUS: Phosphorus: 3.7 mg/dL (ref 2.5–4.6)

## 2022-04-28 LAB — PREALBUMIN: Prealbumin: 16 mg/dL — ABNORMAL LOW (ref 18–38)

## 2022-04-28 MED ORDER — TRAZODONE HCL 50 MG PO TABS
50.0000 mg | ORAL_TABLET | Freq: Every evening | ORAL | Status: DC | PRN
  Administered 2022-04-28: 50 mg via ORAL
  Filled 2022-04-28: qty 1

## 2022-04-28 MED ORDER — AMLODIPINE-OLMESARTAN 5-40 MG PO TABS: 1.0000 | ORAL_TABLET | Freq: Every day | ORAL | Status: DC

## 2022-04-28 MED ORDER — HYDRALAZINE HCL 20 MG/ML IJ SOLN
10.0000 mg | INTRAMUSCULAR | Status: DC | PRN
  Administered 2022-04-29 – 2022-04-30 (×2): 10 mg via INTRAVENOUS
  Filled 2022-04-28 (×2): qty 1

## 2022-04-28 MED ORDER — IPRATROPIUM-ALBUTEROL 0.5-2.5 (3) MG/3ML IN SOLN: 3.0000 mL | RESPIRATORY_TRACT | Status: DC | PRN

## 2022-04-28 MED ORDER — INSULIN ASPART 100 UNIT/ML IJ SOLN
0.0000 [IU] | Freq: Three times a day (TID) | INTRAMUSCULAR | Status: DC
  Administered 2022-04-28: 2 [IU] via SUBCUTANEOUS
  Administered 2022-04-28 (×2): 7 [IU] via SUBCUTANEOUS
  Administered 2022-04-29: 5 [IU] via SUBCUTANEOUS
  Administered 2022-04-29: 1 [IU] via SUBCUTANEOUS
  Administered 2022-04-29: 5 [IU] via SUBCUTANEOUS
  Administered 2022-04-30: 3 [IU] via SUBCUTANEOUS
  Administered 2022-04-30 (×2): 5 [IU] via SUBCUTANEOUS
  Administered 2022-05-01: 2 [IU] via SUBCUTANEOUS

## 2022-04-28 MED ORDER — IRBESARTAN 300 MG PO TABS
300.0000 mg | ORAL_TABLET | Freq: Every day | ORAL | Status: DC
  Administered 2022-04-28 – 2022-05-01 (×4): 300 mg via ORAL
  Filled 2022-04-28 (×4): qty 1

## 2022-04-28 MED ORDER — AMLODIPINE BESYLATE 5 MG PO TABS
5.0000 mg | ORAL_TABLET | Freq: Every day | ORAL | Status: DC
  Administered 2022-04-28 – 2022-05-01 (×4): 5 mg via ORAL
  Filled 2022-04-28 (×4): qty 1

## 2022-04-28 MED ORDER — FERROUS SULFATE 325 (65 FE) MG PO TABS
325.0000 mg | ORAL_TABLET | Freq: Every day | ORAL | Status: DC
  Administered 2022-04-28 – 2022-05-01 (×4): 325 mg via ORAL
  Filled 2022-04-28 (×4): qty 1

## 2022-04-28 MED ORDER — ONDANSETRON HCL 4 MG/2ML IJ SOLN: 4.0000 mg | Freq: Four times a day (QID) | INTRAMUSCULAR | Status: DC | PRN

## 2022-04-28 MED ORDER — METOPROLOL TARTRATE 5 MG/5ML IV SOLN: 5.0000 mg | INTRAVENOUS | Status: DC | PRN

## 2022-04-28 MED ORDER — DM-GUAIFENESIN ER 30-600 MG PO TB12: 1.0000 | ORAL_TABLET | Freq: Two times a day (BID) | ORAL | Status: DC | PRN

## 2022-04-28 MED ORDER — SODIUM CHLORIDE 0.9 % IV SOLN: INTRAVENOUS | Status: DC

## 2022-04-28 NOTE — Evaluation (Signed)
Occupational Therapy Evaluation Patient Details Name: Tricia Huynh MRN: [id number] DOB: 1956-02-02 Today's Date: 04/28/2022   History of Present Illness patient is a 66 year old female who presented to the hosptial with R flank pain, nausea and vomitting. patient was admitted with benign essential HTN, DM with complications, acute renal failure, and acute cystitis. PMH: DM II, anemia HLD   Clinical Impression   Patient is a pleasant 65 year old female who was admitted for above. Patient was noted to have MRI earlier on this date that was unremarkable. Patient was noted to have decreased functional activity tolerance, decreased safety awareness and decreased endurance impacting participation in ADLs. Patient returned to bed at end of session with ultrasound in room to complete test. patients RR was noted to be 20-25 RR per min during session. patients BP was 169/96 mmhg sitting EOB with (118 MAP) HR was 95 bpm. patients blood pressure after transfer from edge of bed to recliner was 178/98 mmhg (122 MAP) and HR was 99 bpm. MD and nurse made aware. Patient would continue to benefit from skilled OT services at this time while admitted and after d/c to address noted deficits in order to improve overall safety and independence in ADLs.       Recommendations for follow up therapy are one component of a multi-disciplinary discharge planning process, led by the attending physician.  Recommendations may be updated based on patient status, additional functional criteria and insurance authorization.   Follow Up Recommendations  Home health OT    Assistance Recommended at Discharge Frequent or constant Supervision/Assistance  Patient can return home with the following A little help with walking and/or transfers;A little help with bathing/dressing/bathroom;Assistance with cooking/housework;Direct supervision/assist for financial management;Assist for transportation;Help with stairs or ramp for  entrance;Direct supervision/assist for medications management    Functional Status Assessment  Patient has had a recent decline in their functional status and demonstrates the ability to make significant improvements in function in a reasonable and predictable amount of time.  Equipment Recommendations  Other (comment) (TBD)    Recommendations for Other Services       Precautions / Restrictions Precautions Precautions: Fall Precaution Comments: monitor BP Restrictions Weight Bearing Restrictions: No      Mobility Bed Mobility Overal bed mobility: Needs Assistance Bed Mobility: Supine to Sit     Supine to sit: Min guard, HOB elevated     General bed mobility comments: with increased time and line management    Transfers                          Balance                                           ADL either performed or assessed with clinical judgement   ADL Overall ADL's : Needs assistance/impaired Eating/Feeding: NPO Eating/Feeding Details (indicate cue type and reason): pending MRI Grooming: Set up;Sitting;Wash/dry hands;Wash/dry face Grooming Details (indicate cue type and reason): EOB Upper Body Bathing: Set up;Sitting Upper Body Bathing Details (indicate cue type and reason): EOB Lower Body Bathing: Moderate assistance;Sitting/lateral leans   Upper Body Dressing : Set up;Sitting Upper Body Dressing Details (indicate cue type and reason): EOB Lower Body Dressing: Maximal assistance;Sitting/lateral leans Lower Body Dressing Details (indicate cue type and reason): with increased time Toilet Transfer: Moderate assistance Toilet Transfer Details (indicate cue  type and reason): with no AD to transfer from edge of bed to recliner in room with noted weakness and instability in BLE. Toileting- Clothing Manipulation and Hygiene: Maximal assistance;Sit to/from stand               Vision Patient Visual Report: No change from baseline        Perception     Praxis      Pertinent Vitals/Pain Pain Assessment Pain Assessment: Faces Faces Pain Scale: Hurts a little bit Pain Location: headache Pain Descriptors / Indicators: Headache Pain Intervention(s): Monitored during session     Hand Dominance Right   Extremity/Trunk Assessment Upper Extremity Assessment Upper Extremity Assessment: Generalized weakness;RUE deficits/detail;LUE deficits/detail RUE Deficits / Details: able to range shoulder FF and ABduction to about 80 degreed with noted grimancing with attempted movement of UEs past this point. patient reported no h/o injuries.   Lower Extremity Assessment Lower Extremity Assessment: Defer to PT evaluation   Cervical / Trunk Assessment Cervical / Trunk Assessment: Normal   Communication Communication Communication: No difficulties   Cognition Arousal/Alertness: Awake/alert Behavior During Therapy: WFL for tasks assessed/performed Overall Cognitive Status: Within Functional Limits for tasks assessed                                       General Comments  patients RR was noted to be 20-25 RR per min during session. patients BP was 169/96 mmhg sitting EOB with (118 MAP) HR was 95 bpm. patients blood pressure after transfer from edge of bed to recliner was 178/98 mmhg (122 MAP) and HR was 99 bpm. MD and nurse made aware.    Exercises     Shoulder Instructions      Home Living Family/patient expects to be discharged to:: Private residence Living Arrangements: Other relatives (grandchildren) Available Help at Discharge: Family;Available PRN/intermittently Type of Home: House Home Access: Stairs to enter [employer] of Steps: 3 Entrance Stairs-Rails: None Home Layout: One level     Bathroom Shower/Tub: Chief Strategy Officer: None          Prior Functioning/Environment Prior Level of Function : Independent/Modified Independent               ADLs  Comments: patient reported spening time in room at home and in chair in room. patient reported she would clean up around her room when she felt good. patient reported that she is able to transfer herself at home from bed to chair. patient reported being independent in toileting tasks, granddaughter helps with showers as needed.        OT Problem List: Decreased activity tolerance;Impaired balance (sitting and/or standing);Decreased safety awareness;Cardiopulmonary status limiting activity;Decreased knowledge of precautions;Decreased knowledge of use of DME or AE      OT Treatment/Interventions: Self-care/ADL training;Therapeutic exercise;Neuromuscular education;Energy conservation;DME and/or AE instruction;Therapeutic activities;Balance training;Patient/family education    OT Goals(Current goals can be found in the care plan section) Acute Rehab OT Goals Patient Stated Goal: to get better OT Goal Formulation: With patient Time For Goal Achievement: 05/12/22 Potential to Achieve Goals: Good  OT Frequency: Min 2X/week    Co-evaluation              AM-PAC OT "6 Clicks" Daily Activity     Outcome Measure Help from another person eating meals?: Total (NPO) Help from another person taking care of personal grooming?:  A Little Help from another person toileting, which includes using toliet, bedpan, or urinal?: A Lot Help from another person bathing (including washing, rinsing, drying)?: A Lot Help from another person to put on and taking off regular upper body clothing?: A Little Help from another person to put on and taking off regular lower body clothing?: A Lot 6 Click Score: 13   End of Session Nurse Communication: Other (comment) (vitals during session)  Activity Tolerance: Patient tolerated treatment well Patient left: in bed;with call bell/phone within reach  OT Visit Diagnosis: Unsteadiness on feet (R26.81);Other abnormalities of gait and mobility (R26.89)                 Time: 4917-9150 OT Time Calculation (min): 23 min Charges:  OT General Charges $OT Visit: 1 Visit OT Evaluation $OT Eval Moderate Complexity: 1 Mod OT Treatments $Self Care/Home Management : 8-22 mins  Jackelyn Poling OTR/L, MS Acute Rehabilitation Department Office# [phone] Pager# [phone]   Marcellina Millin 04/28/2022, 10:04 AM

## 2022-04-28 NOTE — Evaluation (Signed)
Physical Therapy Evaluation Patient Details Name: Tricia Huynh MRN: [id number] DOB: 1956/09/27 Today's Date: 04/28/2022  History of Present Illness  66 yo female admitted with benigh essential HTN, hyperglycemia. Hx of DM, anemia, , CHF  Clinical Impression  On eval, pt was Min guard assist for mobility. She walked ~85 feet with a RW. Pt tolerated activity well. She did report that her L LE feels weak. Discussed d/c plan-pt plans to return home. Will recommend HHPT f/u and RW use for ambulation safety.      Recommendations for follow up therapy are one component of a multi-disciplinary discharge planning process, led by the attending physician.  Recommendations may be updated based on patient status, additional functional criteria and insurance authorization.  Follow Up Recommendations Home health PT    Assistance Recommended at Discharge    Patient can return home with the following  A little help with walking and/or transfers;Assistance with cooking/housework;Assist for transportation    Equipment Recommendations Rolling walker (2 wheels)  Recommendations for Other Services       Functional Status Assessment Patient has had a recent decline in their functional status and demonstrates the ability to make significant improvements in function in a reasonable and predictable amount of time.     Precautions / Restrictions Precautions Precautions: Fall Precaution Comments: monitor BP Restrictions Weight Bearing Restrictions: No      Mobility  Bed Mobility Overal bed mobility: Modified Independent                  Transfers Overall transfer level: Needs assistance Equipment used: Rolling walker (2 wheels) Transfers: Sit to/from Stand Sit to Stand: Supervision           General transfer comment: Supv for safety    Ambulation/Gait Ambulation/Gait assistance: Min guard Gait Distance (Feet): 85 Feet Assistive device: Rolling walker (2 wheels) Gait  Pattern/deviations: Step-through pattern, Decreased stride length       General Gait Details: min guard for safety. pt stated L LE felt weak  Stairs            Wheelchair Mobility    Modified Rankin (Stroke Patients Only)       Balance Overall balance assessment: Needs assistance         Standing balance support: During functional activity Standing balance-Leahy Scale: Fair                               Pertinent Vitals/Pain Pain Assessment Pain Score: 0-No pain    Home Living Family/patient expects to be discharged to:: Private residence Living Arrangements: Other relatives Available Help at Discharge: Family;Available PRN/intermittently Type of Home: House Home Access: Stairs to enter Entrance Stairs-Rails: None Entrance Stairs-Number of Steps: 3   Home Layout: One level Home Equipment: None      Prior Function Prior Level of Function : Needs assist             Mobility Comments: independent-sometimes holds on to walls/furniture       Hand Dominance        Extremity/Trunk Assessment   Upper Extremity Assessment Upper Extremity Assessment: Defer to OT evaluation RUE Deficits / Details: able to range shoulder FF and ABduction to about 80 degreed with noted grimancing with attempted movement of UEs past this point. patient reported no h/o injuries.    Lower Extremity Assessment Lower Extremity Assessment: Generalized weakness    Cervical / Trunk Assessment Cervical / Trunk Assessment:  Normal  Communication   Communication: No difficulties  Cognition Arousal/Alertness: Awake/alert Behavior During Therapy: WFL for tasks assessed/performed Overall Cognitive Status: Within Functional Limits for tasks assessed                                          General Comments General comments (skin integrity, edema, etc.): patients RR was noted to be 20-25 RR per min during session. patients BP was 169/96 mmhg sitting  EOB with (118 MAP) HR was 95 bpm. patients blood pressure after transfer from edge of bed to recliner was 178/98 mmhg (122 MAP) and HR was 99 bpm. MD and nurse made aware.    Exercises     Assessment/Plan    PT Assessment Patient needs continued PT services  PT Problem List Decreased strength;Decreased mobility;Decreased activity tolerance;Decreased balance;Decreased knowledge of use of DME;Pain       PT Treatment Interventions DME instruction;Therapeutic activities;Gait training;Therapeutic exercise;Functional mobility training;Stair training;Balance training    PT Goals (Current goals can be found in the Care Plan section)  Acute Rehab PT Goals Patient Stated Goal: home soon. regain ind PT Goal Formulation: With patient Time For Goal Achievement: 05/12/22 Potential to Achieve Goals: Good    Frequency Min 3X/week     Co-evaluation               AM-PAC PT "6 Clicks" Mobility  Outcome Measure Help needed turning from your back to your side while in a flat bed without using bedrails?: None Help needed moving from lying on your back to sitting on the side of a flat bed without using bedrails?: None Help needed moving to and from a bed to a chair (including a wheelchair)?: A Little Help needed standing up from a chair using your arms (e.g., wheelchair or bedside chair)?: A Little Help needed to walk in hospital room?: A Little Help needed climbing 3-5 steps with a railing? : A Little 6 Click Score: 20    End of Session Equipment Utilized During Treatment: Gait belt Activity Tolerance: Patient tolerated treatment well Patient left: in chair;with call bell/phone within reach;with chair alarm set   PT Visit Diagnosis: Difficulty in walking, not elsewhere classified (R26.2)    Time: 1150-1210 PT Time Calculation (min) (ACUTE ONLY): 20 min   Charges:   PT Evaluation $PT Eval Moderate Complexity: 1 Mod           Doreatha Massed, PT Acute Rehabilitation  Office:  [phone] Pager: [phone]

## 2022-04-28 NOTE — Progress Notes (Signed)
Inpatient Diabetes Program Recommendations  AACE/ADA: New Consensus Statement on Inpatient Glycemic Control (2015)  Target Ranges:  Prepandial:   less than 140 mg/dL      Peak postprandial:   less than 180 mg/dL (1-2 hours)      Critically ill patients:  140 - 180 mg/dL   Lab Results  Component Value Date   GLUCAP 313 (H) 04/28/2022   HGBA1C 10.3 (H) 04/27/2022    Review of Glycemic Control  Diabetes history: DM2 Outpatient Diabetes medications: Lantus 20 BID Current orders for Inpatient glycemic control: Semglee 15 units QHS, Novolog 0-9 units TID with meals and 0-5 HS   HgbA1C - 10.9f  Spoke with pt at bedside regarding her diabetes control at home and HgbA1C of 10.3%. Pt states she only takes Lantus 20 units BID at home. Has appt with Endo in July 2023. Said she has occasional hypos. Monitors blood sugars with glucose meter, although not as often as she should.   May need to decrease home Lantus dose and add rapid-acting insulin at discharge, in order to achieve tighter control.   Had hypoglycemia of 64 after MN.  Inpatient Diabetes Program Recommendations:    Add Novolog meal coverage if eating > 50% - Novolog 2 units TID with meals.  May need to further reduce basal insulin to Semglee 12 units QHS.  Continue to follow.  Thank you. RLorenda Peck RD, LDN, CDE Inpatient Diabetes Coordinator [phone]

## 2022-04-28 NOTE — Progress Notes (Signed)
Carotid artery duplex has been completed. Preliminary results can be found in CV Proc through chart review.   04/28/22 10:21 AM Tricia Huynh RVT

## 2022-04-28 NOTE — TOC Initial Note (Signed)
Transition of Care [facility]) - Initial/Assessment Note    Patient Details  Name: Tricia Huynh MRN: [id number] Date of Birth: [birth year]  [facility]) CM/SW Contact:    Dessa Phi, RN Phone Number: 04/28/2022, 3:02 PM  Clinical Narrative: Recc for HHPT/OT-no preference-checking on agency.RW ordered-[employer] rep Jermaine to deliver t rm prior d/c-left vm.                  Expected Discharge Plan: [place] Barriers to Discharge: Continued Medical Work up   Patient Goals and CMS Choice Patient states their goals for this hospitalization and ongoing recovery are:: Home CMS Medicare.gov Compare Post Acute Care list provided to:: Patient Choice offered to / list presented to : Patient  Expected Discharge Plan and Services Expected Discharge Plan: [place]   Discharge Planning Services: CM Consult   Living arrangements for the past 2 months: Single Family Home                 DME Arranged: Walker rolling DME Agency: [employer] Date DME Agency Contacted: 04/28/22 Time DME Agency Contacted: 1501 Representative spoke with at DME Agency: Brenton Grills            Prior Living Arrangements/Services Living arrangements for the past 2 months: [place] with:: Adult Children Patient language and need for interpreter reviewed:: Yes Do you feel safe going back to the place where you live?: Yes      Need for Family Participation in Patient Care: Yes (Comment) Care giver support system in place?: Yes (comment)   Criminal Activity/Legal Involvement Pertinent to Current Situation/Hospitalization: No - Comment as needed  Activities of Daily Living Home Assistive Devices/Equipment: Dentures (specify type), Eyeglasses (top) ADL Screening (condition at time of admission) Patient's cognitive ability adequate to safely complete daily activities?: Yes Is the patient deaf or have difficulty hearing?: No Does the  patient have difficulty seeing, even when wearing glasses/contacts?: No Does the patient have difficulty concentrating, remembering, or making decisions?: No Patient able to express need for assistance with ADLs?: No Does the patient have difficulty dressing or bathing?: No Independently performs ADLs?: No Communication: Independent Dressing (OT): Independent Grooming: Independent Feeding: Independent Bathing: Independent Toileting: Independent In/Out Bed: Independent Walks in Home: Independent Does the patient have difficulty walking or climbing stairs?: Yes Weakness of Legs: Both Weakness of Arms/Hands: None  Permission Sought/Granted Permission sought to share information with : Case Manager Permission granted to share information with : Yes, Verbal Permission Granted  Share Information with NAME: Case Manager           Emotional Assessment Appearance:: Appears stated age Attitude/Demeanor/Rapport: Gracious Affect (typically observed): Accepting Orientation: : Oriented to Self, Oriented to Place, Oriented to  Time, Oriented to Situation Alcohol / Substance Use: Not Applicable Psych Involvement: No (comment)  Admission diagnosis:  Hyperglycemia due to type 2 diabetes mellitus ([place]) [E11.65] UTI (urinary tract infection) [N39.0] Patient Active Problem List   Diagnosis Date Noted   Hyperglycemia due to type 2 diabetes mellitus ([place]) 04/27/2022   Focal neurological deficit 04/27/2022   Scalp cyst 04/27/2022   Chronic diastolic CHF (congestive heart failure) ([facility]) 04/27/2022   AIN grade I 12/16/2021   External hemorrhoids with complication 93/81/0175   Colitis 12/15/2020   HCAP (healthcare-associated pneumonia) 03/11/2017   Dyslipidemia 03/11/2017   Diabetes mellitus with complication ([place])    Chest pain 12/30/2016   Seizure disorder ([place]) 12/30/2016   Diabetic ketoacidosis without  coma associated with type 2 diabetes mellitus ([place])    DKA (diabetic ketoacidoses)  11/15/2016   UTI (urinary tract infection) 11/15/2016   Acute cystitis without hematuria    Acute renal failure ([facility])    Hyperglycemia 11/14/2016   Diabetes ([place]) 06/05/2016   Altered mental status    CKD (chronic kidney disease) stage 3, GFR 30-59 ml/min ([place]) 05/26/2016   Benign essential HTN 05/26/2016   Seizures ([place]) 01/08/2016   Memory loss 10/04/2015   Acute encephalopathy 06/06/2015   Diabetic neuropathy ([place]) 06/06/2015   PCP:  Jonathon Jordan, MD Pharmacy:   [facility] [address] [place], [place] [address] Phone: [phone] Fax: [address], [place] [address] Phone: [phone] Fax: [phone]     Social Determinants of Health (SDOH) Interventions    Readmission Risk Interventions     No data to display

## 2022-04-28 NOTE — Progress Notes (Signed)
PROGRESS NOTE    Judie Hollick  HAL:[id number] DOB: [birth year] DOA: 04/27/2022 PCP: Jonathon Jordan, MD   Brief Narrative:  66 year old female with history of anal intraepithelial neoplasm, anemia, HLD, DM 2, CKD stage IIIb comes to the hospital for evaluation of weakness especially on the left side and hyperglycemia.  Upon admission she was diagnosed with severe hyperglycemia requiring insulin drip thereafter transition to subcu insulin, acute kidney injury, urinary tract infection and focal neurodeficit.  Patient wasStarted on broad-spectrum antibiotics, IV fluids.  Her blood glucose was also well controlled.  She underwent extensive stroke work-up which was negative.   Assessment & Plan:  Active Problems:   CKD (chronic kidney disease) stage 3, GFR 30-59 ml/min (HCC)   Benign essential HTN   Focal neurological deficit   Hyperglycemia   Acute cystitis without hematuria   Acute renal failure (HCC)   Diabetes mellitus with complication (HCC)   Scalp cyst   Chronic diastolic CHF (congestive heart failure) (HCC)    Generalized weakness greater on the Left side - Unknown if this is acute or chronic.  Underwent extensive stroke work-up including MRI brain, MRA, CT head which is all unremarkable.  A1c is 9.2, LDL 146.  Carotid Dopplers does not show any significant stenosis either.  TSH is normal.  We will give her PT/OT.  Hyperglycemia with history of insulin-dependent diabetes mellitus type 2 Diabetic Neuropathy - Uncontrolled with hemoglobin A1c of 9.2.  Hyperglycemia has resolved.  Continue sliding scale, Accu-Cheks and long-acting insulin.  Acute cystitis - Currently on IV Rocephin, getting IV fluids as well.  CT does not show any evidence of deeper infection.  Acute kidney injury on CKD  3a - Baseline creatinine 1.4.  Admission creatinine 1.8 which has improved this morning to 1.6.  Continue to monitor this.    Severe protein calorie malnutrition - Encourage oral  intake.  Low prealbumin levels.  Hyponatremia - Pseudohyponatremia in the setting of hyperglycemia which is resolved.  Essential hypertension - We will continue home Norvasc and ARB.  IV as needed ordered.  Chronic diastolic congestive heart failure - Appears to be compensated.  Echocardiogram in 2018 showed EF of 60% with grade 1 DD.  Hyperlipidemia - Continue statin  Anal canal mass/dysplasia - Follows outpatient general surgery   DVT prophylaxis: SCD's Start: 04/27/22 2229 Code Status: Full Code Family Communication:  Wilfrid Lund, no answer.   Patient is still feeling extremely weak and clinically appears dehydrated.  Maintain hospital stay on IV antibiotics and IV fluids.  Needs PT/OT.      Subjective: Patient seen in a.m. examined at bedside still feels extremely weak and clinically dehydrated.  Poor oral intake.    Examination:  General exam: Appears calm and comfortable, dry mouth.  Cachectic frail Respiratory system: Clear to auscultation. Respiratory effort normal. Cardiovascular system: S1 & S2 heard, RRR. No JVD, murmurs, rubs, gallops or clicks. No pedal edema. Gastrointestinal system: Abdomen is nondistended, soft and nontender. No organomegaly or masses felt. Normal bowel sounds heard. Central nervous system: Alert and oriented. No focal neurological deficits. Extremities: Symmetric 5 x 5 power. Skin: No rashes, lesions or ulcers Psychiatry: Judgement and insight appear normal. Mood & affect appropriate.     Objective: Vitals:   04/28/22 0055 04/28/22 0255 04/28/22 0608 04/28/22 1020  BP: (!) 171/99 114/79 (!) 149/94 (!) 163/96  Pulse:  81 82 89  Resp:  '18 18 18  '$ Temp:  97.7 F (36.5 C) 97.9 F (36.6 C) 98 F (36.7  C)  TempSrc:  Oral Oral   SpO2:  100% 100% 100%    Intake/Output Summary (Last 24 hours) at 04/28/2022 1128 Last data filed at 04/28/2022 0300 Gross per 24 hour  Intake 497.24 ml  Output --  Net 497.24 ml   There were no  vitals filed for this visit.   Data Reviewed:   CBC: Recent Labs  Lab 04/27/22 1401  WBC 6.5  NEUTROABS 5.1  HGB 9.2*  HCT 27.5*  MCV 89.3  PLT 283   Basic Metabolic Panel: Recent Labs  Lab 04/27/22 1401 04/27/22 2137 04/28/22 0334  NA 126* 135  --   K 5.1 3.5  --   CL 95* 105  --   CO2 21* 22  --   GLUCOSE >1,200* 162*  --   BUN 30* 24*  --   CREATININE 1.84* 1.63*  --   CALCIUM 9.2 9.0  --   MG  --  1.9  --   PHOS  --  3.2 3.7   GFR: CrCl cannot be calculated (Unknown ideal weight.). Liver Function Tests: Recent Labs  Lab 04/27/22 1401 04/27/22 2137  AST 14* 16  ALT 13 12  ALKPHOS 208* 147*  BILITOT 0.4 0.4  PROT 7.7 7.7  ALBUMIN 3.0* 3.0*   Recent Labs  Lab 04/27/22 1401 04/28/22 0321  LIPASE 79* 40   No results for input(s): "AMMONIA" in the last 168 hours. Coagulation Profile: No results for input(s): "INR", "PROTIME" in the last 168 hours. Cardiac Enzymes: Recent Labs  Lab 04/27/22 2137  CKTOTAL 35*   BNP (last 3 results) No results for input(s): "PROBNP" in the last 8760 hours. HbA1C: Recent Labs    04/27/22 2137  HGBA1C 10.3*   CBG: Recent Labs  Lab 04/28/22 0036 04/28/22 0100 04/28/22 0333 04/28/22 0606 04/28/22 0742  GLUCAP 64* 75 220* 201* 140*   Lipid Profile: Recent Labs    04/28/22 0321  CHOL 226*  HDL 53  LDLCALC 146*  TRIG 134  CHOLHDL 4.3   Thyroid Function Tests: Recent Labs    04/27/22 2137  TSH 1.209   Anemia Panel: Recent Labs    04/27/22 2137  VITAMINB12 921*  FOLATE 7.8  FERRITIN 70  TIBC 286  IRON 34  RETICCTPCT 1.6   Sepsis Labs: No results for input(s): "PROCALCITON", "LATICACIDVEN" in the last 168 hours.  No results found for this or any previous visit (from the past 240 hour(s)).       Radiology Studies: VAS US CAROTID  Result Date: 04/28/2022 Carotid Arterial Duplex Study Patient Name:  VENDELA TROUNG [facility]  Date of Exam:   04/28/2022 Medical Rec #: [id number]                Accession #:    [id number] Date of Birth: [birth year]               Patient Gender: F Patient Age:   36 years Exam Location:  [facility] Procedure:      VAS US CAROTID Referring Phys: Nyoka Lint DOUTOVA --------------------------------------------------------------------------------  Indications:       Abnormal gait. Risk Factors:      Hypertension, Diabetes. Comparison Study:  No prior studies. Performing Technologist: Oliver Hum RVT  Examination Guidelines: A complete evaluation includes B-mode imaging, spectral Doppler, color Doppler, and power Doppler as needed of all accessible portions of each vessel. Bilateral testing is considered an integral part of a complete examination. Limited examinations for reoccurring indications may be performed as noted.  Right Carotid Findings: +----------+--------+--------+--------+-----------------------+--------+           PSV cm/sEDV cm/sStenosisPlaque Description     Comments +----------+--------+--------+--------+-----------------------+--------+ CCA Prox  61      17              smooth and heterogenoustortuous +----------+--------+--------+--------+-----------------------+--------+ CCA Distal67      18              smooth and heterogenous         +----------+--------+--------+--------+-----------------------+--------+ ICA Prox  51      12              smooth and heterogenous         +----------+--------+--------+--------+-----------------------+--------+ ICA Distal90      34                                     tortuous +----------+--------+--------+--------+-----------------------+--------+ ECA       50      7                                               +----------+--------+--------+--------+-----------------------+--------+ +----------+--------+-------+--------+-------------------+           PSV cm/sEDV cmsDescribeArm Pressure (mmHG) +----------+--------+-------+--------+-------------------+ [id number]                                         +----------+--------+-------+--------+-------------------+ +---------+--------+--+--------+--+---------+ VertebralPSV cm/s43EDV cm/s14Antegrade +---------+--------+--+--------+--+---------+  Left Carotid Findings: +----------+--------+--------+--------+-----------------------+--------+           PSV cm/sEDV cm/sStenosisPlaque Description     Comments +----------+--------+--------+--------+-----------------------+--------+ CCA Prox  100     24              smooth and heterogenous         +----------+--------+--------+--------+-----------------------+--------+ CCA Distal68      15              smooth and heterogenous         +----------+--------+--------+--------+-----------------------+--------+ ICA Prox  66      16              smooth and heterogenous         +----------+--------+--------+--------+-----------------------+--------+ ICA Distal60      29                                     tortuous +----------+--------+--------+--------+-----------------------+--------+ ECA       57      10                                              +----------+--------+--------+--------+-----------------------+--------+ +----------+--------+--------+--------+-------------------+           PSV cm/sEDV cm/sDescribeArm Pressure (mmHG) +----------+--------+--------+--------+-------------------+ Subclavian100     1                                   +----------+--------+--------+--------+-------------------+ +---------+--------+--+--------+--+---------+ VertebralPSV cm/s50EDV cm/s19Antegrade +---------+--------+--+--------+--+---------+   Summary: Right Carotid: Velocities in the right ICA are consistent with a  1-39% stenosis. Left Carotid: Velocities in the left ICA are consistent with a 1-39% stenosis. Vertebrals: Bilateral vertebral arteries demonstrate antegrade flow. *See table(s) above for measurements and observations.      Preliminary    MR ANGIO HEAD WO CONTRAST  Result Date: 04/28/2022 CLINICAL DATA:  Neuro deficit, acute, stroke suspected. EXAM: MRA HEAD WITHOUT CONTRAST TECHNIQUE: Angiographic images of the Circle of Willis were acquired using MRA technique without intravenous contrast. COMPARISON:  MR head without contrast 04/27/2022 FINDINGS: Anterior circulation: The internal carotid arteries are within normal limits from the high cervical segments through the ICA termini. The A1 and M1 segments are normal. The anterior communicating artery is patent. MCA bifurcations are within normal limits. ACA and MCA branch vessels are normal. Posterior circulation: The left vertebral artery is the dominant vessel. Left PICA origin is visualized and normal. Right PICA origin is below the field of view. Right AICA is dominant. The vertebrobasilar junction and basilar artery are normal. Both posterior cerebral arteries originate from the basilar tip. The PCA branch vessels are within normal limits bilaterally. Anatomic variants: None Other: None. IMPRESSION: Normal MRA circle-of-Willis without significant proximal stenosis, aneurysm, or branch vessel occlusion. Electronically Signed   By: San Morelle M.D.   On: 04/28/2022 08:39   MR BRAIN WO CONTRAST  Result Date: 04/27/2022 CLINICAL DATA:  Acute neurologic deficit EXAM: MRI HEAD WITHOUT CONTRAST TECHNIQUE: Multiplanar, multiecho pulse sequences of the brain and surrounding structures were obtained without intravenous contrast. COMPARISON:  None Available. FINDINGS: Brain: No acute infarct, mass effect or extra-axial collection. No acute or chronic hemorrhage. Normal white matter signal, parenchymal volume and CSF spaces. The midline structures are normal. Vascular: Major flow voids are preserved. Skull and upper cervical spine: Cystic scalp lesion shows reduced diffusivity compared to CSF and is likely an epidermoid cyst. Sinuses/Orbits:No paranasal sinus fluid levels or  advanced mucosal thickening. No mastoid or middle ear effusion. Normal orbits. IMPRESSION: 1. Normal aging brain. 2. Left posterior scalp lesion is most consistent with epidermoid cyst. Electronically Signed   By: Ulyses Jarred M.D.   On: 04/27/2022 21:30   CT HEAD WO CONTRAST (5MM)  Result Date: 04/27/2022 CLINICAL DATA:  Acute stroke suspected EXAM: CT HEAD WITHOUT CONTRAST TECHNIQUE: Contiguous axial images were obtained from the base of the skull through the vertex without intravenous contrast. RADIATION DOSE REDUCTION: This exam was performed according to the departmental dose-optimization program which includes automated exposure control, adjustment of the mA and/or kV according to patient size and/or use of iterative reconstruction technique. COMPARISON:  04/11/2022 FINDINGS: Brain: No acute intracranial hemorrhage. No focal mass lesion. No CT evidence of acute infarction. No midline shift or mass effect. No hydrocephalus. Basilar cisterns are patent. Vascular: No hyperdense vessel or unexpected calcification. Skull: Normal. Negative for fracture or focal lesion. Sinuses/Orbits: Paranasal sinuses and mastoid air cells are clear. Orbits are clear. Other: Ovoid cystic lesion within the scalp posterior to the LEFT parietal bone is unchanged. (Image 16/2). IMPRESSION: 1. No acute intracranial findings. 2. No change from comparison exam Electronically Signed   By: Suzy Bouchard M.D.   On: 04/27/2022 20:40   DG Chest Port 1 View  Result Date: 04/27/2022 CLINICAL DATA:  Weakness.  Burning urination EXAM: PORTABLE CHEST 1 VIEW COMPARISON:  CT 01/25/2022 FINDINGS: Normal mediastinum and cardiac silhouette. Normal pulmonary vasculature. No evidence of effusion, infiltrate, or pneumothorax. No acute bony abnormality. IMPRESSION: No acute cardiopulmonary process. Electronically Signed   By: Suzy Bouchard M.D.   On:  04/27/2022 19:47   CT Renal Stone Study  Result Date: 04/27/2022 CLINICAL DATA:  Right  flank pain, kidney stone suspected. EXAM: CT ABDOMEN AND PELVIS WITHOUT CONTRAST TECHNIQUE: Multidetector CT imaging of the abdomen and pelvis was performed following the standard protocol without IV contrast. RADIATION DOSE REDUCTION: This exam was performed according to the departmental dose-optimization program which includes automated exposure control, adjustment of the mA and/or kV according to patient size and/or use of iterative reconstruction technique. COMPARISON:  CT abdomen and pelvis 06/23/2021 FINDINGS: Lower chest: No basilar lung consolidation or pleural effusion. Hepatobiliary: No focal liver abnormality identified on this unenhanced study. Tiny layering stones in the gallbladder. No biliary dilatation. Pancreas: Unremarkable. Spleen: Unremarkable. Adrenals/Urinary Tract: Unremarkable adrenal glands. 1 cm hypodensity posteriorly in the lower pole of the left kidney compatible with a cyst. Subcentimeter hyperdense lesions in both kidneys, 2 on the right and 1 on the left, incompletely evaluated but likely reflecting proteinaceous or hemorrhagic cysts with the right-sided lesions also being faintly visible on the prior study. No renal or ureteral calculi or hydronephrosis. Unremarkable bladder. Stomach/Bowel: The stomach is unremarkable. There is no evidence of bowel obstruction or inflammation. The appendix is unremarkable. Vascular/Lymphatic: Minimal abdominal aortic atherosclerosis without aneurysm. No enlarged lymph nodes. Reproductive: Status post hysterectomy. No adnexal masses. Other: Likely trace pelvic free fluid. Musculoskeletal: No acute osseous abnormality or suspicious osseous lesion. IMPRESSION: 1. No acute abnormality identified in the abdomen or pelvis. No urinary tract calculi. 2. Cholelithiasis. 3. Aortic Atherosclerosis (ICD10-I70.0). Electronically Signed   By: Logan Bores M.D.   On: 04/27/2022 14:39        Scheduled Meds:   stroke: early stages of recovery book   Does not  apply Once   amitriptyline  50 mg Oral QHS   amLODipine  5 mg Oral Daily   And   irbesartan  300 mg Oral Daily   aspirin EC  81 mg Oral Daily   atorvastatin  40 mg Oral Daily   ferrous sulfate  325 mg Oral Q breakfast   gabapentin  900 mg Oral QHS   insulin aspart  0-9 Units Subcutaneous TID AC & HS   insulin glargine-yfgn  15 Units Subcutaneous QHS   Continuous Infusions:  sodium chloride 75 mL/hr at 04/28/22 0831   cefTRIAXone (ROCEPHIN)  IV       LOS: 0 days   Time spent= 35 mins    Pahoua Schreiner Arsenio Loader, MD [facility]  If 7PM-7AM, please contact night-coverage  04/28/2022, 11:28 AM

## 2022-04-28 NOTE — Care Management Obs Status (Signed)
[facility] NOTIFICATION   Patient Details  Name: Alisabeth Selkirk MRN: [id number] Date of Birth: [birth year]   Medicare Observation Status Notification Given:  Yes    Dessa Phi, RN 04/28/2022, 12:06 PM

## 2022-04-29 DIAGNOSIS — R739 Hyperglycemia, unspecified: Secondary | ICD-10-CM | POA: Diagnosis not present

## 2022-04-29 DIAGNOSIS — I5032 Chronic diastolic (congestive) heart failure: Secondary | ICD-10-CM | POA: Diagnosis not present

## 2022-04-29 DIAGNOSIS — N179 Acute kidney failure, unspecified: Secondary | ICD-10-CM | POA: Diagnosis not present

## 2022-04-29 DIAGNOSIS — E440 Moderate protein-calorie malnutrition: Secondary | ICD-10-CM | POA: Insufficient documentation

## 2022-04-29 DIAGNOSIS — N3000 Acute cystitis without hematuria: Secondary | ICD-10-CM | POA: Diagnosis not present

## 2022-04-29 LAB — GLUCOSE, CAPILLARY
Glucose-Capillary: 104 mg/dL — ABNORMAL HIGH (ref 70–99)
Glucose-Capillary: 56 mg/dL — ABNORMAL LOW (ref 70–99)
Glucose-Capillary: 282 mg/dL — ABNORMAL HIGH (ref 70–99)
Glucose-Capillary: 262 mg/dL — ABNORMAL HIGH (ref 70–99)
Glucose-Capillary: 141 mg/dL — ABNORMAL HIGH (ref 70–99)

## 2022-04-29 LAB — BASIC METABOLIC PANEL
Anion gap: 3 — ABNORMAL LOW (ref 5–15)
BUN: 27 mg/dL — ABNORMAL HIGH (ref 8–23)
CO2: 23 mmol/L (ref 22–32)
Calcium: 8.4 mg/dL — ABNORMAL LOW (ref 8.9–10.3)
Chloride: 114 mmol/L — ABNORMAL HIGH (ref 98–111)
Creatinine, Ser: 1.97 mg/dL — ABNORMAL HIGH (ref 0.44–1.00)
GFR, Estimated: 28 mL/min — ABNORMAL LOW (ref >60)
Glucose, Bld: 86 mg/dL (ref 70–99)
Potassium: 3.6 mmol/L (ref 3.5–5.1)
Sodium: 140 mmol/L (ref 135–145)

## 2022-04-29 LAB — FERRITIN: Ferritin: 45 ng/mL (ref 11–307)

## 2022-04-29 LAB — PREPARE RBC (CROSSMATCH)

## 2022-04-29 LAB — CBC
HCT: 23.2 % — ABNORMAL LOW (ref 36.0–46.0)
Hemoglobin: 7.7 g/dL — ABNORMAL LOW (ref 12.0–15.0)
MCH: 29.7 pg (ref 26.0–34.0)
MCHC: 33.2 g/dL (ref 30.0–36.0)
MCV: 89.6 fL (ref 80.0–100.0)
Platelets: 209 10*3/uL (ref 150–400)
RBC: 2.59 MIL/uL — ABNORMAL LOW (ref 3.87–5.11)
RDW: 12.9 % (ref 11.5–15.5)
WBC: 5.4 10*3/uL (ref 4.0–10.5)
nRBC: 0 % (ref 0.0–0.2)

## 2022-04-29 LAB — IRON AND TIBC
Iron: 47 ug/dL (ref 28–170)
Saturation Ratios: 21 % (ref 10.4–31.8)
TIBC: 229 ug/dL — ABNORMAL LOW (ref 250–450)
UIBC: 182 ug/dL

## 2022-04-29 LAB — HEMOGLOBIN AND HEMATOCRIT, BLOOD
HCT: 31 % — ABNORMAL LOW (ref 36.0–46.0)
Hemoglobin: 10.5 g/dL — ABNORMAL LOW (ref 12.0–15.0)

## 2022-04-29 LAB — MAGNESIUM: Magnesium: 2.2 mg/dL (ref 1.7–2.4)

## 2022-04-29 LAB — VITAMIN B12: Vitamin B-12: 561 pg/mL (ref 180–914)

## 2022-04-29 MED ORDER — ENSURE ENLIVE PO LIQD: 237.0000 mL | Freq: Two times a day (BID) | ORAL | Status: DC

## 2022-04-29 MED ORDER — ADULT MULTIVITAMIN W/MINERALS CH
1.0000 | ORAL_TABLET | Freq: Every day | ORAL | Status: DC
  Administered 2022-04-29 – 2022-05-01 (×3): 1 via ORAL
  Filled 2022-04-29 (×3): qty 1

## 2022-04-29 MED ORDER — SODIUM CHLORIDE 0.9 % IV SOLN: INTRAVENOUS | Status: AC

## 2022-04-29 MED ORDER — GLUCERNA SHAKE PO LIQD
237.0000 mL | Freq: Three times a day (TID) | ORAL | Status: DC
  Administered 2022-04-29 – 2022-05-01 (×6): 237 mL via ORAL
  Filled 2022-04-29 (×7): qty 237

## 2022-04-29 MED ORDER — INSULIN GLARGINE-YFGN 100 UNIT/ML ~~LOC~~ SOLN
10.0000 [IU] | Freq: Every day | SUBCUTANEOUS | Status: DC
  Administered 2022-04-29: 10 [IU] via SUBCUTANEOUS
  Filled 2022-04-29 (×2): qty 0.1

## 2022-04-29 MED ORDER — SODIUM CHLORIDE 0.9% IV SOLUTION: Freq: Once | INTRAVENOUS | Status: AC

## 2022-04-29 NOTE — Progress Notes (Signed)
PROGRESS NOTE    Tricia Huynh  SLP:[id number] DOB: [birth year] DOA: 04/27/2022 PCP: Jonathon Jordan, MD   Brief Narrative:  66 year old female with history of anal intraepithelial neoplasm, anemia, HLD, DM 2, CKD stage IIIb comes to the hospital for evaluation of weakness especially on the left side and hyperglycemia.  Upon admission she was diagnosed with severe hyperglycemia requiring insulin drip thereafter transition to subcu insulin, acute kidney injury, urinary tract infection and focal neurodeficit.  Patient wasStarted on broad-spectrum antibiotics, IV fluids.  Her blood glucose was also well controlled.  She underwent extensive stroke work-up which was negative.  She did have drop in hemoglobin which I suspect patient, unit PRBC ordered.  No signs of bleeding.  Overall still feels very weak, fatigue.   Assessment & Plan:  Principal Problem:   UTI (urinary tract infection) Active Problems:   CKD (chronic kidney disease) stage 3, GFR 30-59 ml/min (HCC)   Benign essential HTN   Focal neurological deficit   Hyperglycemia   Acute cystitis without hematuria   Acute renal failure (HCC)   Diabetes mellitus with complication (HCC)   Scalp cyst   Chronic diastolic CHF (congestive heart failure) (HCC)   Symptomatic anemia - Suspect hemodilutional.  No signs of blood loss.  Baseline around 9.0, this morning 7.7.  Due to her weakness and fatigue will order 1 unit PRBC. Check iron studies.   Generalized weakness greater on the Left side - Unknown if this is acute or chronic.  Underwent extensive stroke work-up including MRI brain, MRA, CT head which is all unremarkable.  A1c is 9.2, LDL 146.  Carotid Dopplers does not show any significant stenosis either.  TSH is normal.  PT/OT is recommended home health therefore we will make arrangements  Hyperglycemia with history of insulin-dependent diabetes mellitus type 2 Diabetic Neuropathy - Uncontrolled with hemoglobin A1c of 9.2.  1  episode of hypoglycemia therefore will reduce Semglee to 10 units at bedtime.  Sliding scale and Accu-Cheks.  Acute cystitis - Currently on IV Rocephin, getting IV fluids as well.  CT does not show any evidence of deeper infection.  Follow-up culture data  Acute kidney injury on CKD  3a - Baseline creatinine 1.4.  Admission creatinine 1.8  > 1.9. Gentle fluids over next 12 hrs.     Severe protein calorie malnutrition - Encourage oral intake.  Low prealbumin levels.  Hyponatremia - Pseudohyponatremia in the setting of hyperglycemia which is resolved.  Essential hypertension - We will continue home Norvasc and ARB.  IV as needed ordered.  Chronic diastolic congestive heart failure - Appears to be compensated.  Echocardiogram in 2018 showed EF of 60% with grade 1 DD.  Hyperlipidemia - Continue statin  Anal canal mass/dysplasia - Follows outpatient general surgery   DVT prophylaxis: SCD's Start: 04/27/22 2229 Code Status: Full Code Family Communication:  Wilfrid Lund, no answer  Still feeling extremely weak suspect some symptomatic anemia.  1 unit PRBC transfusion ordered.  Also creatinine slightly trending upwards requiring IV fluids.  Very poor oral intake.  Hopefully discharge in next 24 to 48 hours.     Subjective: Patient is sitting up in the chair, overall feels weak but slightly better compared to yesterday.  Denies any obvious evidence of blood loss.  Examination: Constitutional: Not in acute distress, frail with bilateral temporal wasting Respiratory: Minimal bibasilar crackles Cardiovascular: Normal sinus rhythm, no rubs Abdomen: Nontender nondistended good bowel sounds Musculoskeletal: No edema noted Skin: No rashes seen Neurologic: CN 2-12 grossly intact.  And nonfocal Psychiatric: Normal judgment and insight. Alert and oriented x 3. Normal mood.  Objective: Vitals:   04/28/22 0608 04/28/22 1020 04/28/22 2015 04/29/22 0551  BP: (!) 149/94 (!) 163/96 (!)  167/98 (!) 149/91  Pulse: 82 89 96 83  Resp: '18 18 20 16  '$ Temp: 97.9 F (36.6 C) 98 F (36.7 C) 98.9 F (37.2 C) 97.9 F (36.6 C)  TempSrc: Oral  Oral Oral  SpO2: 100% 100% 100% 100%    Intake/Output Summary (Last 24 hours) at 04/29/2022 0804 Last data filed at 04/29/2022 0740 Gross per 24 hour  Intake 1576.79 ml  Output --  Net 1576.79 ml   There were no vitals filed for this visit.   Data Reviewed:   CBC: Recent Labs  Lab 04/27/22 1401 04/29/22 0446  WBC 6.5 5.4  NEUTROABS 5.1  --   HGB 9.2* 7.7*  HCT 27.5* 23.2*  MCV 89.3 89.6  PLT 241 161   Basic Metabolic Panel: Recent Labs  Lab 04/27/22 1401 04/27/22 2137 04/28/22 0334 04/29/22 0446  NA 126* 135  --  140  K 5.1 3.5  --  3.6  CL 95* 105  --  114*  CO2 21* 22  --  23  GLUCOSE >1,200* 162*  --  86  BUN 30* 24*  --  27*  CREATININE 1.84* 1.63*  --  1.97*  CALCIUM 9.2 9.0  --  8.4*  MG  --  1.9  --  2.2  PHOS  --  3.2 3.7  --    GFR: CrCl cannot be calculated (Unknown ideal weight.). Liver Function Tests: Recent Labs  Lab 04/27/22 1401 04/27/22 2137  AST 14* 16  ALT 13 12  ALKPHOS 208* 147*  BILITOT 0.4 0.4  PROT 7.7 7.7  ALBUMIN 3.0* 3.0*   Recent Labs  Lab 04/27/22 1401 04/28/22 0321  LIPASE 79* 40   No results for input(s): "AMMONIA" in the last 168 hours. Coagulation Profile: No results for input(s): "INR", "PROTIME" in the last 168 hours. Cardiac Enzymes: Recent Labs  Lab 04/27/22 2137  CKTOTAL 35*   BNP (last 3 results) No results for input(s): "PROBNP" in the last 8760 hours. HbA1C: Recent Labs    04/27/22 2137  HGBA1C 10.3*   CBG: Recent Labs  Lab 04/28/22 0742 04/28/22 1142 04/28/22 1639 04/28/22 2027 04/29/22 0732  GLUCAP 140* 313* 187* 306* 56*   Lipid Profile: Recent Labs    04/28/22 0321  CHOL 226*  HDL 53  LDLCALC 146*  TRIG 134  CHOLHDL 4.3   Thyroid Function Tests: Recent Labs    04/27/22 2137  TSH 1.209   Anemia Panel: Recent Labs     04/27/22 2137  VITAMINB12 921*  FOLATE 7.8  FERRITIN 70  TIBC 286  IRON 34  RETICCTPCT 1.6   Sepsis Labs: No results for input(s): "PROCALCITON", "LATICACIDVEN" in the last 168 hours.  No results found for this or any previous visit (from the past 240 hour(s)).       Radiology Studies: VAS US CAROTID  Result Date: 04/28/2022 Carotid Arterial Duplex Study Patient Name:  KALYSSA ANKER [facility]  Date of Exam:   04/28/2022 Medical Rec #: [id number]               Accession #:    [id number] Date of Birth: January 03, 1956               Patient Gender: F Patient Age:   76 years Exam Location:  [facility]  Procedure:      VAS US CAROTID Referring Phys: ANASTASSIA DOUTOVA --------------------------------------------------------------------------------  Indications:       Abnormal gait. Risk Factors:      Hypertension, Diabetes. Comparison Study:  No prior studies. Performing Technologist: Oliver Hum RVT  Examination Guidelines: A complete evaluation includes B-mode imaging, spectral Doppler, color Doppler, and power Doppler as needed of all accessible portions of each vessel. Bilateral testing is considered an integral part of a complete examination. Limited examinations for reoccurring indications may be performed as noted.  Right Carotid Findings: +----------+--------+--------+--------+-----------------------+--------+           PSV cm/sEDV cm/sStenosisPlaque Description     Comments +----------+--------+--------+--------+-----------------------+--------+ CCA Prox  61      17              smooth and heterogenoustortuous +----------+--------+--------+--------+-----------------------+--------+ CCA Distal67      18              smooth and heterogenous         +----------+--------+--------+--------+-----------------------+--------+ ICA Prox  51      12              smooth and heterogenous         +----------+--------+--------+--------+-----------------------+--------+ ICA  Distal90      34                                     tortuous +----------+--------+--------+--------+-----------------------+--------+ ECA       50      7                                               +----------+--------+--------+--------+-----------------------+--------+ +----------+--------+-------+--------+-------------------+           PSV cm/sEDV cmsDescribeArm Pressure (mmHG) +----------+--------+-------+--------+-------------------+ [id number]                                        +----------+--------+-------+--------+-------------------+ +---------+--------+--+--------+--+---------+ VertebralPSV cm/s43EDV cm/s14Antegrade +---------+--------+--+--------+--+---------+  Left Carotid Findings: +----------+--------+--------+--------+-----------------------+--------+           PSV cm/sEDV cm/sStenosisPlaque Description     Comments +----------+--------+--------+--------+-----------------------+--------+ CCA Prox  100     24              smooth and heterogenous         +----------+--------+--------+--------+-----------------------+--------+ CCA Distal68      15              smooth and heterogenous         +----------+--------+--------+--------+-----------------------+--------+ ICA Prox  66      16              smooth and heterogenous         +----------+--------+--------+--------+-----------------------+--------+ ICA Distal60      29                                     tortuous +----------+--------+--------+--------+-----------------------+--------+ ECA       57      10                                              +----------+--------+--------+--------+-----------------------+--------+ +----------+--------+--------+--------+-------------------+  PSV cm/sEDV cm/sDescribeArm Pressure (mmHG) +----------+--------+--------+--------+-------------------+ Subclavian100     1                                    +----------+--------+--------+--------+-------------------+ +---------+--------+--+--------+--+---------+ VertebralPSV cm/s50EDV cm/s19Antegrade +---------+--------+--+--------+--+---------+   Summary: Right Carotid: Velocities in the right ICA are consistent with a 1-39% stenosis. Left Carotid: Velocities in the left ICA are consistent with a 1-39% stenosis. Vertebrals: Bilateral vertebral arteries demonstrate antegrade flow. *See table(s) above for measurements and observations.  Electronically signed by Servando Snare MD on 04/28/2022 at 6:01:12 PM.    Final    ECHOCARDIOGRAM COMPLETE  Result Date: 04/28/2022    ECHOCARDIOGRAM REPORT   Patient Name:   TYKISHA AREOLA [facility] Date of Exam: 04/28/2022 Medical Rec #:  [id number]              Height:       67.0 in Accession #:    [id number]             Weight:       130.0 lb Date of Birth:  January 13, 1956              BSA:          1.684 m Patient Age:    19 years               BP:           163/96 mmHg Patient Gender: F                      HR:           89 bpm. Exam Location:  Inpatient Procedure: 2D Echo, Cardiac Doppler and Color Doppler Indications:    TIA  History:        Patient has prior history of Echocardiogram examinations, most                 recent 12/31/2016. CHF; Risk Factors:Diabetes and Hypertension.  Sonographer:    Jefferey Pica Referring Phys: [place]  1. No significant LVOT obstruction. Left ventricular ejection fraction, by estimation, is 60 to 65%. The left ventricle has normal function. The left ventricle has no regional wall motion abnormalities. There is severe asymmetric left ventricular hypertrophy of the basal-septal segment. Left ventricular diastolic parameters are consistent with Grade I diastolic dysfunction (impaired relaxation).  2. Right ventricular systolic function is normal. The right ventricular size is normal. Tricuspid regurgitation signal is inadequate for assessing PA pressure.  3. Possible  small PFO with left to right shunt.  4. The mitral valve is normal in structure. No evidence of mitral valve regurgitation.  5. The aortic valve is tricuspid. Aortic valve regurgitation is not visualized.  6. Aortic no significant ascending aortic aneurysm.  7. The inferior vena cava is normal in size with greater than 50% respiratory variability, suggesting right atrial pressure of 3 mmHg. Comparison(s): No prior Echocardiogram. Conclusion(s)/Recommendation(s): Cannot exclude ASD/PFO. Consider transesophageal echocardiogram, if clinically indicated. FINDINGS  Left Ventricle: No significant LVOT obstruction. Left ventricular ejection fraction, by estimation, is 60 to 65%. The left ventricle has normal function. The left ventricle has no regional wall motion abnormalities. The left ventricular internal cavity size was normal in size. There is severe asymmetric left ventricular hypertrophy of the basal-septal segment. Left ventricular diastolic parameters are consistent with Grade I diastolic dysfunction (impaired relaxation). Right Ventricle: The right ventricular size is  normal. No increase in right ventricular wall thickness. Right ventricular systolic function is normal. Tricuspid regurgitation signal is inadequate for assessing PA pressure. Left Atrium: Left atrial size was normal in size. Right Atrium: Right atrial size was normal in size. Pericardium: There is no evidence of pericardial effusion. Mitral Valve: The mitral valve is normal in structure. No evidence of mitral valve regurgitation. Tricuspid Valve: The tricuspid valve is normal in structure. Tricuspid valve regurgitation is not demonstrated. Aortic Valve: The aortic valve is tricuspid. Aortic valve regurgitation is not visualized. Aortic valve peak gradient measures 7.3 mmHg. Pulmonic Valve: The pulmonic valve was normal in structure. Pulmonic valve regurgitation is not visualized. Aorta: No significant ascending aortic aneurysm. Venous: The inferior  vena cava is normal in size with greater than 50% respiratory variability, suggesting right atrial pressure of 3 mmHg.  LEFT VENTRICLE PLAX 2D LVIDd:         4.10 cm   Diastology LVIDs:         2.40 cm   LV e' lateral:   5.70 cm/s LV PW:         1.50 cm   LV E/e' lateral: 9.5 LV IVS:        1.60 cm LVOT diam:     2.00 cm LV SV:         68 LV SV Index:   41 LVOT Area:     3.14 cm  RIGHT VENTRICLE             IVC RV Basal diam:  2.90 cm     IVC diam: 1.20 cm RV S prime:     15.50 cm/s TAPSE (M-mode): 2.2 cm LEFT ATRIUM             Index        RIGHT ATRIUM           Index LA diam:        3.30 cm 1.96 cm/m   RA Area:     16.60 cm LA Vol (A2C):   56.0 ml 33.26 ml/m  RA Volume:   44.80 ml  26.60 ml/m LA Vol (A4C):   42.6 ml 25.30 ml/m LA Biplane Vol: 50.5 ml 29.99 ml/m  AORTIC VALVE                 PULMONIC VALVE AV Area (Vmax): 2.67 cm     PV Vmax:       0.82 m/s AV Vmax:        135.50 cm/s  PV Peak grad:  2.7 mmHg AV Peak Grad:   7.3 mmHg LVOT Vmax:      115.00 cm/s LVOT Vmean:     68.200 cm/s LVOT VTI:       0.218 m  AORTA Ao Root diam: 3.00 cm Ao Asc diam:  3.70 cm MITRAL VALVE MV Area (PHT): 3.27 cm    SHUNTS MV Decel Time: 232 msec    Systemic VTI:  0.22 m MV E velocity: 54.00 cm/s  Systemic Diam: 2.00 cm MV A velocity: 91.70 cm/s MV E/A ratio:  0.59 Mary Scientist, physiological signed by Phineas Inches Signature Date/Time: 04/28/2022/3:38:15 PM    Final    MR ANGIO HEAD WO CONTRAST  Result Date: 04/28/2022 CLINICAL DATA:  Neuro deficit, acute, stroke suspected. EXAM: MRA HEAD WITHOUT CONTRAST TECHNIQUE: Angiographic images of the Circle of Willis were acquired using MRA technique without intravenous contrast. COMPARISON:  MR head without contrast 04/27/2022 FINDINGS: Anterior circulation: The internal carotid arteries are  within normal limits from the high cervical segments through the ICA termini. The A1 and M1 segments are normal. The anterior communicating artery is patent. MCA bifurcations are within  normal limits. ACA and MCA branch vessels are normal. Posterior circulation: The left vertebral artery is the dominant vessel. Left PICA origin is visualized and normal. Right PICA origin is below the field of view. Right AICA is dominant. The vertebrobasilar junction and basilar artery are normal. Both posterior cerebral arteries originate from the basilar tip. The PCA branch vessels are within normal limits bilaterally. Anatomic variants: None Other: None. IMPRESSION: Normal MRA circle-of-Willis without significant proximal stenosis, aneurysm, or branch vessel occlusion. Electronically Signed   By: San Morelle M.D.   On: 04/28/2022 08:39   MR BRAIN WO CONTRAST  Result Date: 04/27/2022 CLINICAL DATA:  Acute neurologic deficit EXAM: MRI HEAD WITHOUT CONTRAST TECHNIQUE: Multiplanar, multiecho pulse sequences of the brain and surrounding structures were obtained without intravenous contrast. COMPARISON:  None Available. FINDINGS: Brain: No acute infarct, mass effect or extra-axial collection. No acute or chronic hemorrhage. Normal white matter signal, parenchymal volume and CSF spaces. The midline structures are normal. Vascular: Major flow voids are preserved. Skull and upper cervical spine: Cystic scalp lesion shows reduced diffusivity compared to CSF and is likely an epidermoid cyst. Sinuses/Orbits:No paranasal sinus fluid levels or advanced mucosal thickening. No mastoid or middle ear effusion. Normal orbits. IMPRESSION: 1. Normal aging brain. 2. Left posterior scalp lesion is most consistent with epidermoid cyst. Electronically Signed   By: Ulyses Jarred M.D.   On: 04/27/2022 21:30   CT HEAD WO CONTRAST (5MM)  Result Date: 04/27/2022 CLINICAL DATA:  Acute stroke suspected EXAM: CT HEAD WITHOUT CONTRAST TECHNIQUE: Contiguous axial images were obtained from the base of the skull through the vertex without intravenous contrast. RADIATION DOSE REDUCTION: This exam was performed according to the  departmental dose-optimization program which includes automated exposure control, adjustment of the mA and/or kV according to patient size and/or use of iterative reconstruction technique. COMPARISON:  04/11/2022 FINDINGS: Brain: No acute intracranial hemorrhage. No focal mass lesion. No CT evidence of acute infarction. No midline shift or mass effect. No hydrocephalus. Basilar cisterns are patent. Vascular: No hyperdense vessel or unexpected calcification. Skull: Normal. Negative for fracture or focal lesion. Sinuses/Orbits: Paranasal sinuses and mastoid air cells are clear. Orbits are clear. Other: Ovoid cystic lesion within the scalp posterior to the LEFT parietal bone is unchanged. (Image 16/2). IMPRESSION: 1. No acute intracranial findings. 2. No change from comparison exam Electronically Signed   By: Suzy Bouchard M.D.   On: 04/27/2022 20:40   DG Chest Port 1 View  Result Date: 04/27/2022 CLINICAL DATA:  Weakness.  Burning urination EXAM: PORTABLE CHEST 1 VIEW COMPARISON:  CT 01/25/2022 FINDINGS: Normal mediastinum and cardiac silhouette. Normal pulmonary vasculature. No evidence of effusion, infiltrate, or pneumothorax. No acute bony abnormality. IMPRESSION: No acute cardiopulmonary process. Electronically Signed   By: Suzy Bouchard M.D.   On: 04/27/2022 19:47   CT Renal Stone Study  Result Date: 04/27/2022 CLINICAL DATA:  Right flank pain, kidney stone suspected. EXAM: CT ABDOMEN AND PELVIS WITHOUT CONTRAST TECHNIQUE: Multidetector CT imaging of the abdomen and pelvis was performed following the standard protocol without IV contrast. RADIATION DOSE REDUCTION: This exam was performed according to the departmental dose-optimization program which includes automated exposure control, adjustment of the mA and/or kV according to patient size and/or use of iterative reconstruction technique. COMPARISON:  CT abdomen and pelvis 06/23/2021 FINDINGS: Lower chest:  No basilar lung consolidation or pleural  effusion. Hepatobiliary: No focal liver abnormality identified on this unenhanced study. Tiny layering stones in the gallbladder. No biliary dilatation. Pancreas: Unremarkable. Spleen: Unremarkable. Adrenals/Urinary Tract: Unremarkable adrenal glands. 1 cm hypodensity posteriorly in the lower pole of the left kidney compatible with a cyst. Subcentimeter hyperdense lesions in both kidneys, 2 on the right and 1 on the left, incompletely evaluated but likely reflecting proteinaceous or hemorrhagic cysts with the right-sided lesions also being faintly visible on the prior study. No renal or ureteral calculi or hydronephrosis. Unremarkable bladder. Stomach/Bowel: The stomach is unremarkable. There is no evidence of bowel obstruction or inflammation. The appendix is unremarkable. Vascular/Lymphatic: Minimal abdominal aortic atherosclerosis without aneurysm. No enlarged lymph nodes. Reproductive: Status post hysterectomy. No adnexal masses. Other: Likely trace pelvic free fluid. Musculoskeletal: No acute osseous abnormality or suspicious osseous lesion. IMPRESSION: 1. No acute abnormality identified in the abdomen or pelvis. No urinary tract calculi. 2. Cholelithiasis. 3. Aortic Atherosclerosis (ICD10-I70.0). Electronically Signed   By: Logan Bores M.D.   On: 04/27/2022 14:39        Scheduled Meds:   stroke: early stages of recovery book   Does not apply Once   sodium chloride   Intravenous Once   amitriptyline  50 mg Oral QHS   amLODipine  5 mg Oral Daily   And   irbesartan  300 mg Oral Daily   aspirin EC  81 mg Oral Daily   atorvastatin  40 mg Oral Daily   ferrous sulfate  325 mg Oral Q breakfast   gabapentin  900 mg Oral QHS   insulin aspart  0-9 Units Subcutaneous TID AC & HS   insulin glargine-yfgn  10 Units Subcutaneous QHS   Continuous Infusions:  sodium chloride Stopped (04/29/22 0740)   cefTRIAXone (ROCEPHIN)  IV Stopped (04/28/22 1746)     LOS: 1 day   Time spent= 35 mins    Darien Kading  Arsenio Loader, MD [facility]  If 7PM-7AM, please contact night-coverage  04/29/2022, 8:04 AM

## 2022-04-29 NOTE — Progress Notes (Addendum)
Initial Nutrition Assessment  DOCUMENTATION CODES:   Non-severe (moderate) malnutrition in context of chronic illness  INTERVENTION:  - will order Ensure Plus High Protein BID, each supplement provides 350 kcal and 20 grams of protein.  - will order 1 tablet multivitamin with minerals/day.    NUTRITION DIAGNOSIS:   Moderate Malnutrition related to chronic illness as evidenced by moderate fat depletion, moderate muscle depletion.  GOAL:   Patient will meet greater than or equal to 90% of their needs  MONITOR:   PO intake, Supplement acceptance, Labs, Weight trends  REASON FOR ASSESSMENT:   Malnutrition Screening Tool  ASSESSMENT:   66 year old female with medical history of anal intraepithelial neoplasm, anemia, HLD, type 2 DM, stage 3 CKD, HTN, diabetic neuropathy, and seizure. She presented to the ED due to weakness (L>R) and hyperglycemia. She was dx with severe hyperglycemia requiring insulin drip, AKI, UTI, and focal neuro-deficit. She was started on abx and IV fluids. Extensive stroke work-up was negative.  Patient sitting up in the chair; no visitors present at the time of RD visit. Patient shares that she lives with her 2 granddaughters and that they take care of all household chores including cooking. Patient typically eats meals and shares that she does eat a lot throughout the day but that she does not gain weight.  She has drank Ensure in the past and is receptive to order during this admission.  She shares that she had eggs and sausage for breakfast this AM. She denied chewing or swallowing difficulty during this meal and denies any chewing or swallowing difficulty at baseline.   She does not use any assistive devices such as cane or walker for ambulation.   She denies any abdominal pain, pressure, cramping, or nausea currently but states she did have N/V on Saturday (6/10) and Sunday (6/11). She has episodes like this periodically and does not know of anything  that causes them to occur.   She shares that last BM was on Saturday (6/10) and that PTA she was having periods of constipation and that a laxative was somewhat helpful.   Weight written on white board of 107 lb (48.6 kg) entered into chart by this RD while in patient's room. PTA the most recently documented weight was 130 lb on 4/24 and weight had been stable since 12/20/2019 until 03/10/2022.  This indicates 23 lb weight loss (17.7% body weight) in the past 2 months; significant for time frame. Notes indicate that patient was dehydrated on admission so will need to monitor weight trends.   She checks CBGs at home and is often 109- mid 200s and shares that once she takes her insulin CBG comes back down.    Labs reviewed; CBGs: 56 and 104 mg/dl, Cl: 114 mmol/l, BUN: 27 mg/dl, creatinine: 1.97 mg/dl, Ca: 8.4 mg/dl, GFR: 28 ml/min.  Medications reviewed; 325 mg ferrous sulfate/day, sliding scale novolog, 10 units semglee/day.  IVF; NS @ 75 ml/hr.    NUTRITION - FOCUSED PHYSICAL EXAM:  Flowsheet Row Most Recent Value  Orbital Region Moderate depletion  Upper Arm Region Moderate depletion  Thoracic and Lumbar Region Unable to assess  Buccal Region Moderate depletion  Temple Region Mild depletion  Clavicle Bone Region Moderate depletion  Clavicle and Acromion Bone Region Moderate depletion  Scapular Bone Region Moderate depletion  Dorsal Hand Mild depletion  Patellar Region Moderate depletion  Anterior Thigh Region Severe depletion  Posterior Calf Region Mild depletion  Edema (RD Assessment) None  Hair Reviewed  Eyes Reviewed  Mouth Reviewed  [no teeth]  Skin Reviewed  Nails Reviewed       Diet Order:   Diet Order             Diet heart healthy/carb modified Room service appropriate? Yes; Fluid consistency: Thin  Diet effective now                   EDUCATION NEEDS:   Education needs have been addressed  Skin:  Skin Assessment: Reviewed RN Assessment  Last BM:   PTA/unknown  Height:   Ht Readings from Last 1 Encounters:  03/10/22 '5\' 7"'$  (1.702 m)    Weight:   Wt Readings from Last 1 Encounters:  04/29/22 48.6 kg     BMI:  Body mass index is 16.78 kg/m.  Estimated Nutritional Needs:  Kcal:  1700-1900 kcal Protein:  85-100 grams Fluid:  >/= 2 L/day     Jarome Matin, MS, RD, LDN Registered Dietitian II Inpatient Clinical Nutrition RD pager # and on-call/weekend pager # available in [facility]

## 2022-04-30 DIAGNOSIS — N170 Acute kidney failure with tubular necrosis: Secondary | ICD-10-CM

## 2022-04-30 DIAGNOSIS — N183 Chronic kidney disease, stage 3 unspecified: Secondary | ICD-10-CM

## 2022-04-30 LAB — PROTEIN ELECTROPHORESIS, SERUM
A/G Ratio: 0.7 (ref 0.7–1.7)
Albumin ELP: 2.8 g/dL — ABNORMAL LOW (ref 2.9–4.4)
Alpha-1-Globulin: 0.2 g/dL (ref 0.0–0.4)
Alpha-2-Globulin: 1.1 g/dL — ABNORMAL HIGH (ref 0.4–1.0)
Beta Globulin: 1 g/dL (ref 0.7–1.3)
Gamma Globulin: 1.6 g/dL (ref 0.4–1.8)
Globulin, Total: 3.9 g/dL (ref 2.2–3.9)
Total Protein ELP: 6.7 g/dL (ref 6.0–8.5)

## 2022-04-30 LAB — GLUCOSE, CAPILLARY
Glucose-Capillary: 60 mg/dL — ABNORMAL LOW (ref 70–99)
Glucose-Capillary: 126 mg/dL — ABNORMAL HIGH (ref 70–99)
Glucose-Capillary: 253 mg/dL — ABNORMAL HIGH (ref 70–99)
Glucose-Capillary: 256 mg/dL — ABNORMAL HIGH (ref 70–99)
Glucose-Capillary: 185 mg/dL — ABNORMAL HIGH (ref 70–99)
Glucose-Capillary: 245 mg/dL — ABNORMAL HIGH (ref 70–99)

## 2022-04-30 LAB — BPAM RBC
Blood Product Expiration Date: 202307062359
ISSUE DATE / TIME: 202306130953
Unit Type and Rh: 5100

## 2022-04-30 LAB — TYPE AND SCREEN
ABO/RH(D): O POS
Antibody Screen: NEGATIVE
Unit division: 0

## 2022-04-30 LAB — BASIC METABOLIC PANEL
Anion gap: 5 (ref 5–15)
BUN: 25 mg/dL — ABNORMAL HIGH (ref 8–23)
CO2: 20 mmol/L — ABNORMAL LOW (ref 22–32)
Calcium: 8.8 mg/dL — ABNORMAL LOW (ref 8.9–10.3)
Chloride: 116 mmol/L — ABNORMAL HIGH (ref 98–111)
Creatinine, Ser: 1.9 mg/dL — ABNORMAL HIGH (ref 0.44–1.00)
GFR, Estimated: 29 mL/min — ABNORMAL LOW (ref >60)
Glucose, Bld: 54 mg/dL — ABNORMAL LOW (ref 70–99)
Potassium: 3.5 mmol/L (ref 3.5–5.1)
Sodium: 141 mmol/L (ref 135–145)

## 2022-04-30 LAB — URINE CULTURE: Culture: >=100000 — AB

## 2022-04-30 LAB — MAGNESIUM: Magnesium: 2 mg/dL (ref 1.7–2.4)

## 2022-04-30 LAB — CBC
HCT: 28.7 % — ABNORMAL LOW (ref 36.0–46.0)
Hemoglobin: 9.4 g/dL — ABNORMAL LOW (ref 12.0–15.0)
MCH: 29.9 pg (ref 26.0–34.0)
MCHC: 32.8 g/dL (ref 30.0–36.0)
MCV: 91.4 fL (ref 80.0–100.0)
Platelets: 231 10*3/uL (ref 150–400)
RBC: 3.14 MIL/uL — ABNORMAL LOW (ref 3.87–5.11)
RDW: 13.3 % (ref 11.5–15.5)
WBC: 7.5 10*3/uL (ref 4.0–10.5)
nRBC: 0 % (ref 0.0–0.2)

## 2022-04-30 MED ORDER — POLYETHYLENE GLYCOL 3350 17 G PO PACK
17.0000 g | PACK | Freq: Two times a day (BID) | ORAL | Status: DC
  Administered 2022-04-30 – 2022-05-01 (×3): 17 g via ORAL
  Filled 2022-04-30 (×3): qty 1

## 2022-04-30 MED ORDER — CEFAZOLIN SODIUM-DEXTROSE 2-4 GM/100ML-% IV SOLN
2.0000 g | Freq: Two times a day (BID) | INTRAVENOUS | Status: DC
  Administered 2022-04-30 – 2022-05-01 (×2): 2 g via INTRAVENOUS
  Filled 2022-04-30 (×2): qty 100

## 2022-04-30 MED ORDER — SENNOSIDES-DOCUSATE SODIUM 8.6-50 MG PO TABS
1.0000 | ORAL_TABLET | Freq: Two times a day (BID) | ORAL | Status: DC
  Administered 2022-04-30 – 2022-05-01 (×3): 1 via ORAL
  Filled 2022-04-30 (×3): qty 1

## 2022-04-30 MED ORDER — INSULIN GLARGINE-YFGN 100 UNIT/ML ~~LOC~~ SOLN
7.0000 [IU] | Freq: Every day | SUBCUTANEOUS | Status: DC
  Administered 2022-04-30: 7 [IU] via SUBCUTANEOUS
  Filled 2022-04-30 (×3): qty 0.07

## 2022-04-30 NOTE — Hospital Course (Addendum)
The patient is a 66 year old female with past medical history significant for but not limited to anal intraepithelial neoplasm, anemia, HLD, DM 2, CKD stage IIIb who comes to the hospital for evaluation of weakness especially on the left side and hyperglycemia.  Upon admission she was diagnosed with severe hyperglycemia requiring insulin drip thereafter transition to subcu insulin, acute kidney injury, urinary tract infection and focal neurodeficit.  Patient wasStarted on broad-spectrum antibiotics, IV fluids.  Her blood glucose was also well controlled but now has become more hypoglycemic.  She underwent extensive stroke work-up which was negative.  She did have drop in hemoglobin which was suspected to be dilutional but a, unit PRBC ordered.  No signs of bleeding.  Overall still feels very weak, fatigue and is improving.  Further work-up reveals that she has a UTI and antibiotics have been de-escalated to IV cefazolin now.  Blood sugars have been on the lower side so we will further de-escalate her long-acting Semglee to 7 units and continue sensitive NovoLog sign scale insulin AC; we will resume her insulin at discharge but reduce the dose to 10 units daily.  She is much improved and her urine culture grew out Klebsiella which was sensitive to cefazolin and ceftriaxone.  We will transition her to p.o. Keflex at discharge.  She is medically stable to be discharged at this time and PT OT recommending home health.

## 2022-04-30 NOTE — Plan of Care (Signed)
  Problem: Clinical Measurements: Goal: Will remain free from infection Outcome: Progressing Goal: Diagnostic test results will improve Outcome: Progressing Goal: Respiratory complications will improve Outcome: Progressing   Problem: Activity: Goal: Risk for activity intolerance will decrease Outcome: Progressing   

## 2022-04-30 NOTE — Progress Notes (Signed)
PROGRESS NOTE    Tricia Huynh  GQQ:[id number] DOB: 1956-08-27 DOA: 04/27/2022 PCP: Jonathon Jordan, MD   Brief Narrative:  The patient is a 66 year old female with past medical history significant for but not limited to anal intraepithelial neoplasm, anemia, HLD, DM 2, CKD stage IIIb who comes to the hospital for evaluation of weakness especially on the left side and hyperglycemia.  Upon admission she was diagnosed with severe hyperglycemia requiring insulin drip thereafter transition to subcu insulin, acute kidney injury, urinary tract infection and focal neurodeficit.  Patient wasStarted on broad-spectrum antibiotics, IV fluids.  Her blood glucose was also well controlled but now has become more hypoglycemic.  She underwent extensive stroke work-up which was negative.  She did have drop in hemoglobin which was suspected to be dilutional but a, unit PRBC ordered.  No signs of bleeding.  Overall still feels very weak, fatigue and is improving.  Further work-up reveals that she has a UTI and antibiotics have been de-escalated to IV cefazolin now.  Blood sugars have been on the lower side so we will further de-escalate her long-acting Semglee to 7 units and continue sensitive NovoLog sign scale insulin AC   Assessment and Plan:  Symptomatic Anemia -Suspect hemodilutional.  No signs of blood loss.   -Baseline around 9.0, yesterday morning 7.7.   -Due to her weakness and fatigue will order 1 unit PRBC.  -Check iron studies and showed an iron of 34, U IBC of 252, TIBC 286 saturation ratios of 12%, ferritin level 70, follow-up with separate, B12 921 -Now hemoglobin/hematocrit trended up to 10.5/31.0 and is now 9.4/28.7 -Continue to monitor for signs and symptoms of bleeding; no overt bleeding noted -Repeat CBC in a.m.   Generalized weakness greater on the Left side -Unknown if this is acute or chronic.   -Underwent extensive stroke work-up including MRI brain, MRA, CT head which is all  unremarkable.  A1c is 9.2, LDL 146.  Carotid Dopplers does not show any significant stenosis either.   -TSH is normal.   -PT/OT is recommended home health therefore we will make arrangements   Hyperglycemia with history of insulin-dependent diabetes mellitus type 2 Diabetic Neuropathy Uncontrolled with hemoglobin A1c of 9.2.  She had a few episodes of hypoglycemia therefore will reduce Semglee to 10 units at bedtime because she had another episode of hypoglycemia reduced 77 units nightly.  Continue sensitive sliding scale and Accu-Cheks. -CBGs ranging from 60-256   Acute cystitis with Klebsiella UTI - Currently on IV Rocephin and will de-escalate to IV cefazolin, getting IV fluids as well.   -CT does not show any evidence of deeper infection.  Follow-up culture data -Klebsiella Pneumoniae >100,000   Acute kidney injury on CKD 3a Metabolic acidosis -Baseline creatinine 1.4.  Admission creatinine 1.8  > 1.9.  -Patient's BUNs/creatinine trended up and went from 24/1.63 to 27/1.97 and is now 25/1.90 -Avoid further nephrotoxic medications, contrast dyes, hypotension and dehydration to ensure adequate renal perfusion   Moderate protein calorie malnutrition -Encourage oral intake.  Low prealbumin levels. -Nutrition Status: Nutrition Problem: Moderate Malnutrition Etiology: chronic illness Signs/Symptoms: moderate fat depletion, moderate muscle depletion Interventions: Ensure Enlive (each supplement provides 350kcal and 20 grams of protein), MVI   Hyponatremia -Pseudohyponatremia in the setting of hyperglycemia which is resolved.   Essential hypertension -We will continue home Norvasc and ARB with amlodipine 5 mg p.o. daily and irbesartan 300 mils p.o. daily.  IV as needed ordered. -Continue to monitor blood pressures per protocol -Last blood pressure reading  was 160/98   Chronic diastolic congestive heart failure -Appears to be compensated.  -Echocardiogram in 2018 showed EF of 60%  with grade 1 DD. -Strict I's and O's and daily weights -Continue to monitor for signs and symptoms of volume overload   Hyperlipidemia -Continue Atorvastatin 40 mg po Daily    Anal canal mass/dysplasia -Follows outpatient general surgery  DVT prophylaxis: SCD's Start: 04/27/22 2229    Code Status: Full Code Family Communication: No family currently at bedside  Disposition Plan:  Level of care: Med-Surg Status is: Inpatient Remains inpatient appropriate because: Cultures are finally back and growing Klebsiella pneumoniae.  PT OT recommending home health and anticipating changing to oral antibiotics in the morning if hemoglobin remains stable.  Can likely be discharged in 24 to 48 hours    Consultants:  None  Procedures:  None  Antimicrobials:  Anti-infectives (From admission, onward)    Start     Dose/Rate Route Frequency Ordered Stop   04/30/22 1800  ceFAZolin (ANCEF) IVPB 2g/100 mL premix        2 g 200 mL/hr over 30 Minutes Intravenous Every 12 hours 04/30/22 0940     04/28/22 1800  cefTRIAXone (ROCEPHIN) 1 g in sodium chloride 0.9 % 100 mL IVPB  Status:  Discontinued        1 g 200 mL/hr over 30 Minutes Intravenous Every 24 hours 04/27/22 2229 04/30/22 0939   04/27/22 1745  cefTRIAXone (ROCEPHIN) 1 g in sodium chloride 0.9 % 100 mL IVPB        1 g 200 mL/hr over 30 Minutes Intravenous  Once 04/27/22 1736 04/27/22 1848        Subjective: Patient was seen and examined at bedside NG denied any current complaints and states that she feels a little bit better and stronger.  No nausea or vomiting.  States that she did have burning discomfort in her urine which is now improved.  Objective: Vitals:   04/29/22 1318 04/29/22 2109 04/30/22 0502 04/30/22 1322  BP: (!) 148/80 (!) 165/94 (!) 165/99 (!) 160/98  Pulse: (!) 106 100 95 (!) 101  Resp: '17 18 18 18  '$ Temp: 98.1 F (36.7 C) 98.2 F (36.8 C) 98 F (36.7 C) 98.6 F (37 C)  TempSrc: Oral Oral Oral Oral  SpO2: 100%  100% 100% 100%  Weight:        Intake/Output Summary (Last 24 hours) at 04/30/2022 1643 Last data filed at 04/30/2022 1323 Gross per 24 hour  Intake 1169.25 ml  Output --  Net 1169.25 ml   Filed Weights   04/29/22 1003 04/29/22 1038  Weight: 59 kg 48.6 kg   Examination: Physical Exam:  Constitutional: Thin African-American female currently in no acute distress appears calm Respiratory: Diminished to auscultation bilaterally unlabored breathing, no wheezing, rales, rhonchi or crackles. Normal respiratory effort and patient is not tachypenic. No accessory muscle use.  Not wearing any supplemental oxygen Cardiovascular: RRR, no murmurs / rubs / gallops. S1 and S2 auscultated. No extremity edema..  Abdomen: Soft, non-tender, non-distended. Bowel sounds positive.  GU: Deferred. Musculoskeletal: No clubbing / cyanosis of digits/nails. No joint deformity upper and lower extremities.  Skin: No rashes, lesions, ulcers on limited skin evaluation. No induration; Warm and dry.  Neurologic: CN 2-12 grossly intact with no focal deficits. Romberg sign and cerebellar reflexes not assessed.  Psychiatric: Normal judgment and insight. Alert and oriented x 3. Normal mood and appropriate affect.   Data Reviewed: I have personally reviewed following labs and imaging  studies  CBC: Recent Labs  Lab 04/27/22 1401 04/29/22 0446 04/29/22 1316 04/30/22 0537  WBC 6.5 5.4  --  7.5  NEUTROABS 5.1  --   --   --   HGB 9.2* 7.7* 10.5* 9.4*  HCT 27.5* 23.2* 31.0* 28.7*  MCV 89.3 89.6  --  91.4  PLT 241 209  --  106   Basic Metabolic Panel: Recent Labs  Lab 04/27/22 1401 04/27/22 2137 04/28/22 0334 04/29/22 0446 04/30/22 0537  NA 126* 135  --  140 141  K 5.1 3.5  --  3.6 3.5  CL 95* 105  --  114* 116*  CO2 21* 22  --  23 20*  GLUCOSE >1,200* 162*  --  86 54*  BUN 30* 24*  --  27* 25*  CREATININE 1.84* 1.63*  --  1.97* 1.90*  CALCIUM 9.2 9.0  --  8.4* 8.8*  MG  --  1.9  --  2.2 2.0  PHOS  --   3.2 3.7  --   --    GFR: Estimated Creatinine Clearance: 22.3 mL/min (A) (by C-G formula based on SCr of 1.9 mg/dL (H)). Liver Function Tests: Recent Labs  Lab 04/27/22 1401 04/27/22 2137  AST 14* 16  ALT 13 12  ALKPHOS 208* 147*  BILITOT 0.4 0.4  PROT 7.7 7.7  ALBUMIN 3.0* 3.0*   Recent Labs  Lab 04/27/22 1401 04/28/22 0321  LIPASE 79* 40   No results for input(s): "AMMONIA" in the last 168 hours. Coagulation Profile: No results for input(s): "INR", "PROTIME" in the last 168 hours. Cardiac Enzymes: Recent Labs  Lab 04/27/22 2137  CKTOTAL 35*   BNP (last 3 results) No results for input(s): "PROBNP" in the last 8760 hours. HbA1C: Recent Labs    04/27/22 2137  HGBA1C 10.3*   CBG: Recent Labs  Lab 04/29/22 2203 04/30/22 0720 04/30/22 0809 04/30/22 1120 04/30/22 1636  GLUCAP 141* 60* 126* 253* 256*   Lipid Profile: Recent Labs    04/28/22 0321  CHOL 226*  HDL 53  LDLCALC 146*  TRIG 134  CHOLHDL 4.3   Thyroid Function Tests: Recent Labs    04/27/22 2137  TSH 1.209   Anemia Panel: Recent Labs    04/27/22 2137 04/29/22 0446  VITAMINB12 921* 561  FOLATE 7.8  --   FERRITIN 70 45  TIBC 286 229*  IRON 34 47  RETICCTPCT 1.6  --    Sepsis Labs: No results for input(s): "PROCALCITON", "LATICACIDVEN" in the last 168 hours.  Recent Results (from the past 240 hour(s))  Urine Culture     Status: Abnormal   Collection Time: 04/27/22  4:58 PM   Specimen: Urine, Clean Catch  Result Value Ref Range Status   Specimen Description   Final    URINE, CLEAN CATCH Performed at [facility], [place] [address] [place], [place] [address]    Special Requests   Final    NONE Performed at [facility], [place] [address], [place], [place] [address]    Culture >=100,000 COLONIES/mL KLEBSIELLA PNEUMONIAE (A)  Final   Report Status 04/30/2022 FINAL  Final   Organism ID, Bacteria KLEBSIELLA PNEUMONIAE (A)  Final       Susceptibility   Klebsiella pneumoniae - MIC*    AMPICILLIN RESISTANT Resistant     CEFAZOLIN <=4 SENSITIVE Sensitive     CEFEPIME <=0.12 SENSITIVE Sensitive     CEFTRIAXONE <=0.25 SENSITIVE Sensitive     CIPROFLOXACIN <=0.25 SENSITIVE Sensitive  GENTAMICIN <=1 SENSITIVE Sensitive     IMIPENEM 0.5 SENSITIVE Sensitive     NITROFURANTOIN <=16 SENSITIVE Sensitive     TRIMETH/SULFA >=320 RESISTANT Resistant     AMPICILLIN/SULBACTAM <=2 SENSITIVE Sensitive     PIP/TAZO <=4 SENSITIVE Sensitive     * >=100,000 COLONIES/mL KLEBSIELLA PNEUMONIAE     Radiology Studies: No results found.  Scheduled Meds:   stroke: early stages of recovery book   Does not apply Once   amitriptyline  50 mg Oral QHS   amLODipine  5 mg Oral Daily   And   irbesartan  300 mg Oral Daily   aspirin EC  81 mg Oral Daily   atorvastatin  40 mg Oral Daily   feeding supplement (GLUCERNA SHAKE)  237 mL Oral TID BM   ferrous sulfate  325 mg Oral Q breakfast   gabapentin  900 mg Oral QHS   insulin aspart  0-9 Units Subcutaneous TID AC & HS   insulin glargine-yfgn  7 Units Subcutaneous QHS   multivitamin with minerals  1 tablet Oral Daily   polyethylene glycol  17 g Oral BID   senna-docusate  1 tablet Oral BID   Continuous Infusions:   ceFAZolin (ANCEF) IV      LOS: 2 days   Raiford Noble, DO [facility] Available via Epic secure chat 7am-7pm After these hours, please refer to coverage provider listed on [email or web address] 04/30/2022, 4:43 PM

## 2022-04-30 NOTE — Progress Notes (Signed)
Occupational Therapy Treatment Patient Details Name: Tricia Huynh MRN: [id number] DOB: 1956/05/04 Today's Date: 04/30/2022   History of present illness 66 yo female admitted with benigh essential HTN, hyperglycemia. Hx of DM, anemia, , CHF   OT comments  Patient reports feeling better today. Patient able to ambulate to bathroom and in hallway with min guard. She is slightly unsteady but self corrects. She had one major LOB standing up from recliner - did not wait for therapist to fully bring legs down before getting up. Therapist had to catch patient to stop her from falling backwards. Patient able to perform toileting and standing at sink to wash hands. Patient demonstrating improves strength and tolerance. Do not expect she will need OT services at discharge.   Recommendations for follow up therapy are one component of a multi-disciplinary discharge planning process, led by the attending physician.  Recommendations may be updated based on patient status, additional functional criteria and insurance authorization.    Follow Up Recommendations  No OT follow up    Assistance Recommended at Discharge Intermittent Supervision/Assistance  Patient can return home with the following  A little help with walking and/or transfers;A little help with bathing/dressing/bathroom;Assistance with cooking/housework   Equipment Recommendations  None recommended by OT    Recommendations for Other Services      Precautions / Restrictions Precautions Precautions: Fall Precaution Comments: monitor BP Restrictions Weight Bearing Restrictions: No       Mobility Bed Mobility                    Transfers                         Balance Overall balance assessment: Mild deficits observed, not formally tested                                         ADL either performed or assessed with clinical judgement   ADL Overall ADL's : Needs assistance/impaired      Grooming: Supervision/safety;Standing;Wash/dry hands Grooming Details (indicate cue type and reason): at sink                 Toilet Transfer: Min guard;Grab bars;Ambulation Toilet Transfer Details (indicate cue type and reason): ambulated to bathroom with min guard Toileting- Clothing Manipulation and Hygiene: Sit to/from stand;Min guard       Functional mobility during ADLs: Min guard General ADL Comments: One major LOB when standing up from recliner that therapist had to correct otherwise min guard for ambulation in room to bathroom and in hall. No device.    Extremity/Trunk Assessment Upper Extremity Assessment Upper Extremity Assessment: Overall WFL for tasks assessed            Vision Patient Visual Report: No change from baseline     Perception     Praxis      Cognition Arousal/Alertness: Awake/alert Behavior During Therapy: WFL for tasks assessed/performed Overall Cognitive Status: Within Functional Limits for tasks assessed                                          Exercises      Shoulder Instructions       General Comments      Pertinent Vitals/ Pain  Pain Assessment Pain Assessment: No/denies pain  Home Living                                          Prior Functioning/Environment              Frequency  Min 2X/week        Progress Toward Goals  OT Goals(current goals can now be found in the care plan section)  Progress towards OT goals: Progressing toward goals  Acute Rehab OT Goals Patient Stated Goal: get stronger OT Goal Formulation: With patient Time For Goal Achievement: 05/12/22 Potential to Achieve Goals: Good  Plan Discharge plan needs to be updated    Co-evaluation                 AM-PAC OT "6 Clicks" Daily Activity     Outcome Measure   Help from another person eating meals?: None Help from another person taking care of personal grooming?: A Little Help  from another person toileting, which includes using toliet, bedpan, or urinal?: A Little Help from another person bathing (including washing, rinsing, drying)?: A Little Help from another person to put on and taking off regular upper body clothing?: A Little Help from another person to put on and taking off regular lower body clothing?: A Little 6 Click Score: 19    End of Session Equipment Utilized During Treatment: Gait belt  OT Visit Diagnosis: Unsteadiness on feet (R26.81);Other abnormalities of gait and mobility (R26.89)   Activity Tolerance Patient tolerated treatment well   Patient Left in chair;with chair alarm set   Nurse Communication Mobility status        Time: 7342-8768 OT Time Calculation (min): 13 min  Charges: OT General Charges $OT Visit: 1 Visit OT Treatments $Self Care/Home Management : 8-22 mins  Derl Barrow, OTR/L [place]  Office [phone] Pager: [place] 04/30/2022, 2:16 PM

## 2022-05-01 DIAGNOSIS — B9689 Other specified bacterial agents as the cause of diseases classified elsewhere: Secondary | ICD-10-CM

## 2022-05-01 DIAGNOSIS — N390 Urinary tract infection, site not specified: Secondary | ICD-10-CM

## 2022-05-01 LAB — GLUCOSE, CAPILLARY
Glucose-Capillary: 235 mg/dL — ABNORMAL HIGH (ref 70–99)
Glucose-Capillary: 191 mg/dL — ABNORMAL HIGH (ref 70–99)
Glucose-Capillary: 120 mg/dL — ABNORMAL HIGH (ref 70–99)

## 2022-05-01 LAB — COMPREHENSIVE METABOLIC PANEL WITH GFR
ALT: 11 U/L (ref 0–44)
AST: 14 U/L — ABNORMAL LOW (ref 15–41)
Albumin: 2.4 g/dL — ABNORMAL LOW (ref 3.5–5.0)
Alkaline Phosphatase: 113 U/L (ref 38–126)
Anion gap: 6 (ref 5–15)
BUN: 36 mg/dL — ABNORMAL HIGH (ref 8–23)
CO2: 21 mmol/L — ABNORMAL LOW (ref 22–32)
Calcium: 8.6 mg/dL — ABNORMAL LOW (ref 8.9–10.3)
Chloride: 111 mmol/L (ref 98–111)
Creatinine, Ser: 1.85 mg/dL — ABNORMAL HIGH (ref 0.44–1.00)
GFR, Estimated: 30 mL/min — ABNORMAL LOW
Glucose, Bld: 222 mg/dL — ABNORMAL HIGH (ref 70–99)
Potassium: 4 mmol/L (ref 3.5–5.1)
Sodium: 138 mmol/L (ref 135–145)
Total Bilirubin: 0.3 mg/dL (ref 0.3–1.2)
Total Protein: 6.2 g/dL — ABNORMAL LOW (ref 6.5–8.1)

## 2022-05-01 LAB — CBC WITH DIFFERENTIAL/PLATELET
Abs Immature Granulocytes: 0.02 10*3/uL (ref 0.00–0.07)
Basophils Absolute: 0 10*3/uL (ref 0.0–0.1)
Basophils Relative: 1 %
Eosinophils Absolute: 0 10*3/uL (ref 0.0–0.5)
Eosinophils Relative: 0 %
HCT: 26.3 % — ABNORMAL LOW (ref 36.0–46.0)
Hemoglobin: 8.6 g/dL — ABNORMAL LOW (ref 12.0–15.0)
Immature Granulocytes: 0 %
Lymphocytes Relative: 40 %
Lymphs Abs: 2.3 10*3/uL (ref 0.7–4.0)
MCH: 29.4 pg (ref 26.0–34.0)
MCHC: 32.7 g/dL (ref 30.0–36.0)
MCV: 89.8 fL (ref 80.0–100.0)
Monocytes Absolute: 0.5 10*3/uL (ref 0.1–1.0)
Monocytes Relative: 8 %
Neutro Abs: 3 10*3/uL (ref 1.7–7.7)
Neutrophils Relative %: 51 %
Platelets: 217 10*3/uL (ref 150–400)
RBC: 2.93 MIL/uL — ABNORMAL LOW (ref 3.87–5.11)
RDW: 13.2 % (ref 11.5–15.5)
WBC: 5.8 10*3/uL (ref 4.0–10.5)
nRBC: 0 % (ref 0.0–0.2)

## 2022-05-01 LAB — PHOSPHORUS: Phosphorus: 5.1 mg/dL — ABNORMAL HIGH (ref 2.5–4.6)

## 2022-05-01 LAB — MAGNESIUM: Magnesium: 2.1 mg/dL (ref 1.7–2.4)

## 2022-05-01 MED ORDER — POLYETHYLENE GLYCOL 3350 17 G PO PACK: 17.0000 g | PACK | Freq: Every day | ORAL | 0 refills | Status: DC

## 2022-05-01 MED ORDER — CEPHALEXIN 250 MG PO CAPS: 250.0000 mg | ORAL_CAPSULE | Freq: Three times a day (TID) | ORAL | 0 refills | Status: AC

## 2022-05-01 MED ORDER — GLUCERNA SHAKE PO LIQD: 237.0000 mL | Freq: Three times a day (TID) | ORAL | 0 refills | Status: DC

## 2022-05-01 MED ORDER — ADULT MULTIVITAMIN W/MINERALS CH: 1.0000 | ORAL_TABLET | Freq: Every day | ORAL | 0 refills | Status: DC

## 2022-05-01 MED ORDER — LANTUS SOLOSTAR 100 UNIT/ML ~~LOC~~ SOPN: 10.0000 [IU] | PEN_INJECTOR | Freq: Every day | SUBCUTANEOUS | Status: DC

## 2022-05-01 MED ORDER — CEPHALEXIN 250 MG PO CAPS: 250.0000 mg | ORAL_CAPSULE | Freq: Three times a day (TID) | ORAL | Status: DC

## 2022-05-01 MED ORDER — SENNOSIDES-DOCUSATE SODIUM 8.6-50 MG PO TABS: 1.0000 | ORAL_TABLET | Freq: Every day | ORAL | 0 refills | Status: DC

## 2022-05-01 MED ORDER — FERROUS SULFATE 325 (65 FE) MG PO TABS: 325.0000 mg | ORAL_TABLET | Freq: Every day | ORAL | 0 refills | Status: AC

## 2022-05-01 NOTE — Progress Notes (Signed)
Provided discharge teaching to pt. Pt verbalized understanding. PIVx2 removed. Pt belongings returned

## 2022-05-01 NOTE — Discharge Summary (Addendum)
Physician Discharge Summary   Patient: Tricia Huynh MRN: [id number] DOB: 01/27/1956  Admit date:     04/27/2022  Discharge date: 05/01/22  Discharge Physician: Raiford Noble, DO   PCP: Jonathon Jordan, MD   Recommendations at discharge:   Follow-up with PCP within 1 to 2 weeks and repeat CBC, CMP, mag, Phos Follow-up with nephrology in outpatient setting Follow-up with general surgeon outpatient setting  Discharge Diagnoses: Principal Problem:   UTI (urinary tract infection) Active Problems:   CKD (chronic kidney disease) stage 3, GFR 30-59 ml/min (HCC)   Benign essential HTN   Focal neurological deficit   Hyperglycemia   Acute cystitis without hematuria   Acute renal failure (HCC)   Diabetes mellitus with complication (HCC)   Scalp cyst   Chronic diastolic CHF (congestive heart failure) (HCC)   Malnutrition of moderate degree  Resolved Problems:   * No resolved hospital problems. [facility] Course: The patient is a 66 year old female with past medical history significant for but not limited to anal intraepithelial neoplasm, anemia, HLD, DM 2, CKD stage IIIb who comes to the hospital for evaluation of weakness especially on the left side and hyperglycemia.  Upon admission she was diagnosed with severe hyperglycemia requiring insulin drip thereafter transition to subcu insulin, acute kidney injury, urinary tract infection and focal neurodeficit.  Patient wasStarted on broad-spectrum antibiotics, IV fluids.  Her blood glucose was also well controlled but now has become more hypoglycemic.  She underwent extensive stroke work-up which was negative.  She did have drop in hemoglobin which was suspected to be dilutional but a, unit PRBC ordered.  No signs of bleeding.  Overall still feels very weak, fatigue and is improving.  Further work-up reveals that she has a UTI and antibiotics have been de-escalated to IV cefazolin now.  Blood sugars have been on the lower side so we will  further de-escalate her long-acting Semglee to 7 units and continue sensitive NovoLog sign scale insulin AC; we will resume her insulin at discharge but reduce the dose to 10 units daily.  She is much improved and her urine culture grew out Klebsiella which was sensitive to cefazolin and ceftriaxone.  We will transition her to p.o. Keflex at discharge.  She is medically stable to be discharged at this time and PT OT recommending home health.   Assessment and Plan:  Symptomatic Anemia -Suspect hemodilutional.  No signs of blood loss.   -Baseline around 9.0, yesterday morning 7.7.   -Due to her weakness and fatigue will order 1 unit PRBC.  -Check iron studies and showed an iron of 34, U IBC of 252, TIBC 286 saturation ratios of 12%, ferritin level 70, follow-up with separate, B12 921 -Now hemoglobin/hematocrit trended up to 10.5/31.0 and is now 9.4/28.7 yesterday and today dropped slightly to 8.6/26.3 -Continue to monitor for signs and symptoms of bleeding; no overt bleeding noted -Repeat CBC within 1 week   Generalized weakness greater on the Left side -Unknown if this is acute or chronic.   -Underwent extensive stroke work-up including MRI brain, MRA, CT head which is all unremarkable.  A1c is 9.2, LDL 146.  Carotid Dopplers does not show any significant stenosis either.   -TSH is normal.   -PT/OT is recommended home health therefore we will make arrangements and she is stable for discharge at this time   Hyperglycemia with history of insulin-dependent diabetes mellitus type 2 Diabetic Neuropathy Uncontrolled with hemoglobin A1c of 9.2.  She had a few episodes of  hypoglycemia therefore will reduce Semglee to 10 units at bedtime because she had another episode of hypoglycemia reduced 77 units nightly.  Continue sensitive sliding scale and Accu-Cheks. -CBGs ranging from 120-245 now   Acute cystitis with Klebsiella UTI - Currently on IV Rocephin and will de-escalate to IV cefazolin, getting IV  fluids as well.   -CT does not show any evidence of deeper infection.  Follow-up culture data -Klebsiella Pneumoniae >100,000 that was pansensitive to cephalosporins so we will de-escalate to p.o. Keflex to 50 mg every 8 scheduled   Acute kidney injury on CKD 3a Metabolic acidosis -Baseline creatinine 1.4.  Admission creatinine 1.8  > 1.9.  -Patient's BUNs/creatinine trended up and went from 24/1.63 to 27/1.97 and is now 25/1.90 yesterday and is now improved slightly to 36/1.85 and will need further evaluation given that she is now back to CKD stage IIIb -She has a slight metabolic acidosis with a CO2 of 21, anion gap of 6 and a chloride level of 111 -Avoid further nephrotoxic medications, contrast dyes, hypotension and dehydration to ensure adequate renal perfusion -Follow-up with PCP and nephrology in outpatient setting   Moderate protein calorie malnutrition -Encourage oral intake.  Low prealbumin levels. -Nutrition Status: Nutrition Problem: Moderate Malnutrition Etiology: chronic illness Signs/Symptoms: moderate fat depletion, moderate muscle depletion Interventions: Ensure Enlive (each supplement provides 350kcal and 20 grams of protein), MVI   Hyponatremia -Pseudohyponatremia in the setting of hyperglycemia which is resolved.   Essential hypertension -We will continue home Norvasc and ARB with amlodipine 5 mg p.o. daily and irbesartan 300 mils p.o. daily.  IV as needed ordered. -Continue to monitor blood pressures per protocol -Last blood pressure reading was 160/98   Chronic diastolic congestive heart failure -Appears to be compensated.  -Echocardiogram in 2018 showed EF of 60% with grade 1 DD. -Strict I's and O's and daily weights; she is positive for 0.836 L since admission -Continue to monitor for signs and symptoms of volume overload   Hyperlipidemia -Continue Atorvastatin 40 mg po Daily    Anal canal mass/dysplasia -Follows outpatient general surgery  Nutrition  Documentation    [facility]Discharged) from 04/27/2022 in [place]  Nutrition Problem Moderate Malnutrition  Etiology chronic illness  Nutrition Goal Patient will meet greater than or equal to 90% of their needs  Interventions Ensure Enlive (each supplement provides 350kcal and 20 grams of protein), MVI      Consultants: None   Procedures performed:  None  Disposition: Home health Diet recommendation:  Cardiac and Carb modified diet DISCHARGE MEDICATION: Allergies as of 05/01/2022       Reactions   Percocet [oxycodone-acetaminophen] Nausea And Vomiting, Other (See Comments)   Causes the patient to be shaky/unsteady, also   Vicodin [hydrocodone-acetaminophen] Nausea And Vomiting, Other (See Comments)   Causes the patient to be shaky/unsteady, also        Medication List     STOP taking these medications    ondansetron 4 MG disintegrating tablet Commonly known as: Zofran ODT   ondansetron 4 MG tablet Commonly known as: ZOFRAN       TAKE these medications    Accu-Chek Aviva Plus test strip Generic drug: glucose blood 1 each by Other route 2 (two) times daily. E11.9   Accu-Chek Aviva Plus w/Device Kit 1 each by Does not apply route 2 (two) times daily. E11.9   Accu-Chek Softclix Lancets lancets 1 each by Other route 2 (two) times daily. E11.9  acetaminophen 500 MG tablet Commonly known as: TYLENOL Take 1,000 mg by mouth every 6 (six) hours as needed for mild pain or headache.   amitriptyline 50 MG tablet Commonly known as: ELAVIL Take 50 mg by mouth daily.   amLODipine-olmesartan 5-40 MG tablet Commonly known as: AZOR Take 1 tablet by mouth daily.   aspirin EC 81 MG tablet Take 81 mg by mouth daily. Notes to patient: Last Dose of this Medication was given on May 01, 2022 at 9:36 am   atorvastatin 40 MG tablet Commonly known as: LIPITOR Take 40 mg by mouth daily. Notes to patient:  Last Dose of this Medication was given on May 01, 2022 at 9:36 am   cephALEXin 250 MG capsule Commonly known as: KEFLEX Take 1 capsule (250 mg total) by mouth every 8 (eight) hours for 2 days. Notes to patient: This Medication is to be taken every 8 hours for 2 Days   feeding supplement (GLUCERNA SHAKE) Liqd Take 237 mLs by mouth 3 (three) times daily between meals. Notes to patient: Please take a Glucerna Shake 3 times daily between Meals    ferrous sulfate 325 (65 FE) MG tablet Take 1 tablet (325 mg total) by mouth daily with breakfast. Start taking on: May 02, 2022 Notes to patient: Last Dose of this Medication was given on May 01, 2022 at 7:55 am     gabapentin 100 MG capsule Commonly known as: NEURONTIN Take 900 mg by mouth at bedtime. Notes to patient: Please take this Medication at bedtime    hydrocortisone 2.5 % rectal cream Commonly known as: ANUSOL-HC Apply 1 application topically 2 (two) times daily.   Lantus SoloStar 100 UNIT/ML Solostar Pen Generic drug: insulin glargine Inject 10 Units into the skin daily. And pen needles 1/day What changed:  how much to take when to take this   multivitamin with minerals Tabs tablet Take 1 tablet by mouth daily. Start taking on: May 02, 2022 Notes to patient: Last Dose of this Medication was given on May 01, 2022 at 9:36 am   polyethylene glycol 17 g packet Commonly known as: MIRALAX / GLYCOLAX Take 17 g by mouth daily. Notes to patient: Last Dose of this Medication was given on May 01, 2022 at 9:36 am   senna-docusate 8.6-50 MG tablet Commonly known as: Senokot-S Take 1 tablet by mouth at bedtime. Notes to patient: Last Dose of this Medication was given on May 01, 2022 at 9:36 am Please take this Medication at bedtime starting on May 02, 2022        Follow-up Information     [facility] Follow up.   Why: rolling walker;Jermaine rep [phone]               Discharge Exam: Filed Weights   04/29/22  1003 04/29/22 1038  Weight: 59 kg 48.6 kg   Vitals:   05/01/22 0543 05/01/22 0936  BP: (!) 151/99 (!) 152/88  Pulse: 94   Resp: 18   Temp: 97.7 F (36.5 C)   SpO2: 98%    Examination: Physical Exam:  Constitutional: Thin African-American female currently no acute distress appears calm Respiratory: Diminished to auscultation bilaterally with coarse breath sounds, no wheezing, rales, rhonchi or crackles. Normal respiratory effort and patient is not tachypenic. No accessory muscle use.  Unlabored breathing Cardiovascular: RRR, no murmurs / rubs / gallops. S1 and S2 auscultated.  Abdomen: Soft, non-tender, non-distended. Bowel sounds positive.  GU: Deferred. Musculoskeletal: No clubbing / cyanosis of digits/nails.  No joint deformity upper and lower extremities. Neurologic: CN 2-12 grossly intact with no focal deficits. Romberg sign and cerebellar reflexes not assessed.  Psychiatric: Normal judgment and insight. Alert and oriented x 3. Normal mood and appropriate affect.   Condition at discharge: stable  The results of significant diagnostics from this hospitalization (including imaging, microbiology, ancillary and laboratory) are listed below for reference.   Imaging Studies: VAS US CAROTID  Result Date: 04/28/2022 Carotid Arterial Duplex Study Patient Name:  HALAINA VANDUZER [facility]  Date of Exam:   04/28/2022 Medical Rec #: [id number]               Accession #:    [id number] Date of Birth: 10-26-1956               Patient Gender: F Patient Age:   47 years Exam Location:  [facility] Procedure:      VAS US CAROTID Referring Phys: Nyoka Lint DOUTOVA --------------------------------------------------------------------------------  Indications:       Abnormal gait. Risk Factors:      Hypertension, Diabetes. Comparison Study:  No prior studies. Performing Technologist: Oliver Hum RVT  Examination Guidelines: A complete evaluation includes B-mode imaging, spectral Doppler, color  Doppler, and power Doppler as needed of all accessible portions of each vessel. Bilateral testing is considered an integral part of a complete examination. Limited examinations for reoccurring indications may be performed as noted.  Right Carotid Findings: +----------+--------+--------+--------+-----------------------+--------+           PSV cm/sEDV cm/sStenosisPlaque Description     Comments +----------+--------+--------+--------+-----------------------+--------+ CCA Prox  61      17              smooth and heterogenoustortuous +----------+--------+--------+--------+-----------------------+--------+ CCA Distal67      18              smooth and heterogenous         +----------+--------+--------+--------+-----------------------+--------+ ICA Prox  51      12              smooth and heterogenous         +----------+--------+--------+--------+-----------------------+--------+ ICA Distal90      34                                     tortuous +----------+--------+--------+--------+-----------------------+--------+ ECA       50      7                                               +----------+--------+--------+--------+-----------------------+--------+ +----------+--------+-------+--------+-------------------+           PSV cm/sEDV cmsDescribeArm Pressure (mmHG) +----------+--------+-------+--------+-------------------+ [id number]                                        +----------+--------+-------+--------+-------------------+ +---------+--------+--+--------+--+---------+ VertebralPSV cm/s43EDV cm/s14Antegrade +---------+--------+--+--------+--+---------+  Left Carotid Findings: +----------+--------+--------+--------+-----------------------+--------+           PSV cm/sEDV cm/sStenosisPlaque Description     Comments +----------+--------+--------+--------+-----------------------+--------+ CCA Prox  100     24              smooth and heterogenous          +----------+--------+--------+--------+-----------------------+--------+ CCA Distal68      15  smooth and heterogenous         +----------+--------+--------+--------+-----------------------+--------+ ICA Prox  66      16              smooth and heterogenous         +----------+--------+--------+--------+-----------------------+--------+ ICA Distal60      29                                     tortuous +----------+--------+--------+--------+-----------------------+--------+ ECA       57      10                                              +----------+--------+--------+--------+-----------------------+--------+ +----------+--------+--------+--------+-------------------+           PSV cm/sEDV cm/sDescribeArm Pressure (mmHG) +----------+--------+--------+--------+-------------------+ Subclavian100     1                                   +----------+--------+--------+--------+-------------------+ +---------+--------+--+--------+--+---------+ VertebralPSV cm/s50EDV cm/s19Antegrade +---------+--------+--+--------+--+---------+   Summary: Right Carotid: Velocities in the right ICA are consistent with a 1-39% stenosis. Left Carotid: Velocities in the left ICA are consistent with a 1-39% stenosis. Vertebrals: Bilateral vertebral arteries demonstrate antegrade flow. *See table(s) above for measurements and observations.  Electronically signed by Servando Snare MD on 04/28/2022 at 6:01:12 PM.    Final    ECHOCARDIOGRAM COMPLETE  Result Date: 04/28/2022    ECHOCARDIOGRAM REPORT   Patient Name:   SHAILA GILCHREST [facility] Date of Exam: 04/28/2022 Medical Rec #:  [id number]              Height:       67.0 in Accession #:    [id number]             Weight:       130.0 lb Date of Birth:  1955/12/23              BSA:          1.684 m Patient Age:    54 years               BP:           163/96 mmHg Patient Gender: F                      HR:           89 bpm. Exam Location:   Inpatient Procedure: 2D Echo, Cardiac Doppler and Color Doppler Indications:    TIA  History:        Patient has prior history of Echocardiogram examinations, most                 recent 12/31/2016. CHF; Risk Factors:Diabetes and Hypertension.  Sonographer:    Jefferey Pica Referring Phys: [place]  1. No significant LVOT obstruction. Left ventricular ejection fraction, by estimation, is 60 to 65%. The left ventricle has normal function. The left ventricle has no regional wall motion abnormalities. There is severe asymmetric left ventricular hypertrophy of the basal-septal segment. Left ventricular diastolic parameters are consistent with Grade I diastolic dysfunction (impaired relaxation).  2. Right ventricular systolic function is normal. The right ventricular size is normal. Tricuspid regurgitation signal  is inadequate for assessing PA pressure.  3. Possible small PFO with left to right shunt.  4. The mitral valve is normal in structure. No evidence of mitral valve regurgitation.  5. The aortic valve is tricuspid. Aortic valve regurgitation is not visualized.  6. Aortic no significant ascending aortic aneurysm.  7. The inferior vena cava is normal in size with greater than 50% respiratory variability, suggesting right atrial pressure of 3 mmHg. Comparison(s): No prior Echocardiogram. Conclusion(s)/Recommendation(s): Cannot exclude ASD/PFO. Consider transesophageal echocardiogram, if clinically indicated. FINDINGS  Left Ventricle: No significant LVOT obstruction. Left ventricular ejection fraction, by estimation, is 60 to 65%. The left ventricle has normal function. The left ventricle has no regional wall motion abnormalities. The left ventricular internal cavity size was normal in size. There is severe asymmetric left ventricular hypertrophy of the basal-septal segment. Left ventricular diastolic parameters are consistent with Grade I diastolic dysfunction (impaired relaxation). Right  Ventricle: The right ventricular size is normal. No increase in right ventricular wall thickness. Right ventricular systolic function is normal. Tricuspid regurgitation signal is inadequate for assessing PA pressure. Left Atrium: Left atrial size was normal in size. Right Atrium: Right atrial size was normal in size. Pericardium: There is no evidence of pericardial effusion. Mitral Valve: The mitral valve is normal in structure. No evidence of mitral valve regurgitation. Tricuspid Valve: The tricuspid valve is normal in structure. Tricuspid valve regurgitation is not demonstrated. Aortic Valve: The aortic valve is tricuspid. Aortic valve regurgitation is not visualized. Aortic valve peak gradient measures 7.3 mmHg. Pulmonic Valve: The pulmonic valve was normal in structure. Pulmonic valve regurgitation is not visualized. Aorta: No significant ascending aortic aneurysm. Venous: The inferior vena cava is normal in size with greater than 50% respiratory variability, suggesting right atrial pressure of 3 mmHg.  LEFT VENTRICLE PLAX 2D LVIDd:         4.10 cm   Diastology LVIDs:         2.40 cm   LV e' lateral:   5.70 cm/s LV PW:         1.50 cm   LV E/e' lateral: 9.5 LV IVS:        1.60 cm LVOT diam:     2.00 cm LV SV:         68 LV SV Index:   41 LVOT Area:     3.14 cm  RIGHT VENTRICLE             IVC RV Basal diam:  2.90 cm     IVC diam: 1.20 cm RV S prime:     15.50 cm/s TAPSE (M-mode): 2.2 cm LEFT ATRIUM             Index        RIGHT ATRIUM           Index LA diam:        3.30 cm 1.96 cm/m   RA Area:     16.60 cm LA Vol (A2C):   56.0 ml 33.26 ml/m  RA Volume:   44.80 ml  26.60 ml/m LA Vol (A4C):   42.6 ml 25.30 ml/m LA Biplane Vol: 50.5 ml 29.99 ml/m  AORTIC VALVE                 PULMONIC VALVE AV Area (Vmax): 2.67 cm     PV Vmax:       0.82 m/s AV Vmax:        135.50 cm/s  PV Peak grad:  2.7  mmHg AV Peak Grad:   7.3 mmHg LVOT Vmax:      115.00 cm/s LVOT Vmean:     68.200 cm/s LVOT VTI:       0.218 m  AORTA  Ao Root diam: 3.00 cm Ao Asc diam:  3.70 cm MITRAL VALVE MV Area (PHT): 3.27 cm    SHUNTS MV Decel Time: 232 msec    Systemic VTI:  0.22 m MV E velocity: 54.00 cm/s  Systemic Diam: 2.00 cm MV A velocity: 91.70 cm/s MV E/A ratio:  0.59 Mary Scientist, physiological signed by Phineas Inches Signature Date/Time: 04/28/2022/3:38:15 PM    Final    MR ANGIO HEAD WO CONTRAST  Result Date: 04/28/2022 CLINICAL DATA:  Neuro deficit, acute, stroke suspected. EXAM: MRA HEAD WITHOUT CONTRAST TECHNIQUE: Angiographic images of the Circle of Willis were acquired using MRA technique without intravenous contrast. COMPARISON:  MR head without contrast 04/27/2022 FINDINGS: Anterior circulation: The internal carotid arteries are within normal limits from the high cervical segments through the ICA termini. The A1 and M1 segments are normal. The anterior communicating artery is patent. MCA bifurcations are within normal limits. ACA and MCA branch vessels are normal. Posterior circulation: The left vertebral artery is the dominant vessel. Left PICA origin is visualized and normal. Right PICA origin is below the field of view. Right AICA is dominant. The vertebrobasilar junction and basilar artery are normal. Both posterior cerebral arteries originate from the basilar tip. The PCA branch vessels are within normal limits bilaterally. Anatomic variants: None Other: None. IMPRESSION: Normal MRA circle-of-Willis without significant proximal stenosis, aneurysm, or branch vessel occlusion. Electronically Signed   By: San Morelle M.D.   On: 04/28/2022 08:39   MR BRAIN WO CONTRAST  Result Date: 04/27/2022 CLINICAL DATA:  Acute neurologic deficit EXAM: MRI HEAD WITHOUT CONTRAST TECHNIQUE: Multiplanar, multiecho pulse sequences of the brain and surrounding structures were obtained without intravenous contrast. COMPARISON:  None Available. FINDINGS: Brain: No acute infarct, mass effect or extra-axial collection. No acute or chronic  hemorrhage. Normal white matter signal, parenchymal volume and CSF spaces. The midline structures are normal. Vascular: Major flow voids are preserved. Skull and upper cervical spine: Cystic scalp lesion shows reduced diffusivity compared to CSF and is likely an epidermoid cyst. Sinuses/Orbits:No paranasal sinus fluid levels or advanced mucosal thickening. No mastoid or middle ear effusion. Normal orbits. IMPRESSION: 1. Normal aging brain. 2. Left posterior scalp lesion is most consistent with epidermoid cyst. Electronically Signed   By: Ulyses Jarred M.D.   On: 04/27/2022 21:30   CT HEAD WO CONTRAST (5MM)  Result Date: 04/27/2022 CLINICAL DATA:  Acute stroke suspected EXAM: CT HEAD WITHOUT CONTRAST TECHNIQUE: Contiguous axial images were obtained from the base of the skull through the vertex without intravenous contrast. RADIATION DOSE REDUCTION: This exam was performed according to the departmental dose-optimization program which includes automated exposure control, adjustment of the mA and/or kV according to patient size and/or use of iterative reconstruction technique. COMPARISON:  04/11/2022 FINDINGS: Brain: No acute intracranial hemorrhage. No focal mass lesion. No CT evidence of acute infarction. No midline shift or mass effect. No hydrocephalus. Basilar cisterns are patent. Vascular: No hyperdense vessel or unexpected calcification. Skull: Normal. Negative for fracture or focal lesion. Sinuses/Orbits: Paranasal sinuses and mastoid air cells are clear. Orbits are clear. Other: Ovoid cystic lesion within the scalp posterior to the LEFT parietal bone is unchanged. (Image 16/2). IMPRESSION: 1. No acute intracranial findings. 2. No change from comparison exam Electronically Signed   By:  Suzy Bouchard M.D.   On: 04/27/2022 20:40   DG Chest Port 1 View  Result Date: 04/27/2022 CLINICAL DATA:  Weakness.  Burning urination EXAM: PORTABLE CHEST 1 VIEW COMPARISON:  CT 01/25/2022 FINDINGS: Normal mediastinum  and cardiac silhouette. Normal pulmonary vasculature. No evidence of effusion, infiltrate, or pneumothorax. No acute bony abnormality. IMPRESSION: No acute cardiopulmonary process. Electronically Signed   By: Suzy Bouchard M.D.   On: 04/27/2022 19:47   CT Renal Stone Study  Result Date: 04/27/2022 CLINICAL DATA:  Right flank pain, kidney stone suspected. EXAM: CT ABDOMEN AND PELVIS WITHOUT CONTRAST TECHNIQUE: Multidetector CT imaging of the abdomen and pelvis was performed following the standard protocol without IV contrast. RADIATION DOSE REDUCTION: This exam was performed according to the departmental dose-optimization program which includes automated exposure control, adjustment of the mA and/or kV according to patient size and/or use of iterative reconstruction technique. COMPARISON:  CT abdomen and pelvis 06/23/2021 FINDINGS: Lower chest: No basilar lung consolidation or pleural effusion. Hepatobiliary: No focal liver abnormality identified on this unenhanced study. Tiny layering stones in the gallbladder. No biliary dilatation. Pancreas: Unremarkable. Spleen: Unremarkable. Adrenals/Urinary Tract: Unremarkable adrenal glands. 1 cm hypodensity posteriorly in the lower pole of the left kidney compatible with a cyst. Subcentimeter hyperdense lesions in both kidneys, 2 on the right and 1 on the left, incompletely evaluated but likely reflecting proteinaceous or hemorrhagic cysts with the right-sided lesions also being faintly visible on the prior study. No renal or ureteral calculi or hydronephrosis. Unremarkable bladder. Stomach/Bowel: The stomach is unremarkable. There is no evidence of bowel obstruction or inflammation. The appendix is unremarkable. Vascular/Lymphatic: Minimal abdominal aortic atherosclerosis without aneurysm. No enlarged lymph nodes. Reproductive: Status post hysterectomy. No adnexal masses. Other: Likely trace pelvic free fluid. Musculoskeletal: No acute osseous abnormality or  suspicious osseous lesion. IMPRESSION: 1. No acute abnormality identified in the abdomen or pelvis. No urinary tract calculi. 2. Cholelithiasis. 3. Aortic Atherosclerosis (ICD10-I70.0). Electronically Signed   By: Logan Bores M.D.   On: 04/27/2022 14:39   CT HEAD WO CONTRAST (5MM)  Result Date: 04/12/2022 CLINICAL DATA:  Left posterior scalp lesion increased in size. EXAM: CT HEAD WITHOUT CONTRAST TECHNIQUE: Contiguous axial images were obtained from the base of the skull through the vertex without intravenous contrast. RADIATION DOSE REDUCTION: This exam was performed according to the departmental dose-optimization program which includes automated exposure control, adjustment of the mA and/or kV according to patient size and/or use of iterative reconstruction technique. COMPARISON:  CT head 11/21/2021. FINDINGS: Brain: No evidence of acute infarction, hemorrhage, hydrocephalus, extra-axial collection or mass lesion/mass effect. Vascular: No hyperdense vessel identified. Skull: Mildly increased size of a low-attenuation cystic lesion in the left occipital scalp, now measuring 2.8 x 1.0 cm. This lesion is near a suture with scalloping of the calvarium. Sinuses/Orbits: Mild paranasal sinus mucosal thickening. No acute orbital findings. Other: No mastoid effusions. IMPRESSION: 1. Mildly increased size of a low-attenuation cystic lesion in the left occipital scalp with scalloping of the adjacent calvarium, potentially a dermoid cyst or benign cyst. 2. No evidence of acute intracranial abnormality. Electronically Signed   By: Margaretha Sheffield M.D.   On: 04/12/2022 12:53    Microbiology: Results for orders placed or performed during the hospital encounter of 04/27/22  Urine Culture     Status: Abnormal   Collection Time: 04/27/22  4:58 PM   Specimen: Urine, Clean Catch  Result Value Ref Range Status   Specimen Description   Final    URINE,  CLEAN CATCH Performed at [facility], [place]  [address], [place], [place] [address]    Special Requests   Final    NONE Performed at [facility], [place] [address] [place], [place] [address]    Culture >=100,000 COLONIES/mL KLEBSIELLA PNEUMONIAE (A)  Final   Report Status 04/30/2022 FINAL  Final   Organism ID, Bacteria KLEBSIELLA PNEUMONIAE (A)  Final      Susceptibility   Klebsiella pneumoniae - MIC*    AMPICILLIN RESISTANT Resistant     CEFAZOLIN <=4 SENSITIVE Sensitive     CEFEPIME <=0.12 SENSITIVE Sensitive     CEFTRIAXONE <=0.25 SENSITIVE Sensitive     CIPROFLOXACIN <=0.25 SENSITIVE Sensitive     GENTAMICIN <=1 SENSITIVE Sensitive     IMIPENEM 0.5 SENSITIVE Sensitive     NITROFURANTOIN <=16 SENSITIVE Sensitive     TRIMETH/SULFA >=320 RESISTANT Resistant     AMPICILLIN/SULBACTAM <=2 SENSITIVE Sensitive     PIP/TAZO <=4 SENSITIVE Sensitive     * >=100,000 COLONIES/mL KLEBSIELLA PNEUMONIAE    Labs: CBC: Recent Labs  Lab 04/27/22 1401 04/29/22 0446 04/29/22 1316 04/30/22 0537 05/01/22 0519  WBC 6.5 5.4  --  7.5 5.8  NEUTROABS 5.1  --   --   --  3.0  HGB 9.2* 7.7* 10.5* 9.4* 8.6*  HCT 27.5* 23.2* 31.0* 28.7* 26.3*  MCV 89.3 89.6  --  91.4 89.8  PLT 241 209  --  231 702   Basic Metabolic Panel: Recent Labs  Lab 04/27/22 1401 04/27/22 2137 04/28/22 0334 04/29/22 0446 04/30/22 0537 05/01/22 0519  NA 126* 135  --  140 141 138  K 5.1 3.5  --  3.6 3.5 4.0  CL 95* 105  --  114* 116* 111  CO2 21* 22  --  23 20* 21*  GLUCOSE >1,200* 162*  --  86 54* 222*  BUN 30* 24*  --  27* 25* 36*  CREATININE 1.84* 1.63*  --  1.97* 1.90* 1.85*  CALCIUM 9.2 9.0  --  8.4* 8.8* 8.6*  MG  --  1.9  --  2.2 2.0 2.1  PHOS  --  3.2 3.7  --   --  5.1*   Liver Function Tests: Recent Labs  Lab 04/27/22 1401 04/27/22 2137 05/01/22 0519  AST 14* 16 14*  ALT 13 12 11   ALKPHOS 208* 147* 113  BILITOT 0.4 0.4 0.3  PROT 7.7 7.7 6.2*  ALBUMIN 3.0* 3.0* 2.4*   CBG: Recent Labs  Lab 04/30/22 2030 04/30/22 2250  05/01/22 0348 05/01/22 0726 05/01/22 1120  GLUCAP 185* 245* 235* 191* 120*   Discharge time spent: greater than 30 minutes.  Signed: Raiford Noble, DO [facility] 05/01/2022

## 2022-05-05 ENCOUNTER — Telehealth: Payer: Self-pay | Admitting: Neurology

## 2022-05-05 NOTE — Telephone Encounter (Signed)
Rescheduled 6/20 appt with pt over the phone- MD out.

## 2022-05-06 ENCOUNTER — Ambulatory Visit: Payer: 59 | Admitting: Neurology

## 2022-05-15 ENCOUNTER — Ambulatory Visit: Payer: 59 | Admitting: Neurology

## 2022-05-15 ENCOUNTER — Encounter: Payer: Self-pay | Admitting: Neurology

## 2022-07-23 ENCOUNTER — Emergency Department (HOSPITAL_COMMUNITY): Payer: Medicare Other

## 2022-07-23 ENCOUNTER — Inpatient Hospital Stay (HOSPITAL_COMMUNITY)
Admission: EM | Admit: 2022-07-23 | Discharge: 2022-07-28 | DRG: 304 | Disposition: A | Discharge status: Home Health | Payer: Medicare Other | Attending: Family Medicine | Admitting: Family Medicine

## 2022-07-23 ENCOUNTER — Encounter (HOSPITAL_COMMUNITY): Payer: Self-pay

## 2022-07-23 DIAGNOSIS — Z886 Allergy status to analgesic agent status: Secondary | ICD-10-CM | POA: Diagnosis not present

## 2022-07-23 DIAGNOSIS — Z885 Allergy status to narcotic agent status: Secondary | ICD-10-CM

## 2022-07-23 DIAGNOSIS — Z841 Family history of disorders of kidney and ureter: Secondary | ICD-10-CM

## 2022-07-23 DIAGNOSIS — I169 Hypertensive crisis, unspecified: Principal | ICD-10-CM | POA: Diagnosis present

## 2022-07-23 DIAGNOSIS — Z794 Long term (current) use of insulin: Secondary | ICD-10-CM | POA: Diagnosis not present

## 2022-07-23 DIAGNOSIS — E118 Type 2 diabetes mellitus with unspecified complications: Secondary | ICD-10-CM | POA: Diagnosis not present

## 2022-07-23 DIAGNOSIS — E440 Moderate protein-calorie malnutrition: Secondary | ICD-10-CM | POA: Diagnosis present

## 2022-07-23 DIAGNOSIS — Z833 Family history of diabetes mellitus: Secondary | ICD-10-CM

## 2022-07-23 DIAGNOSIS — D631 Anemia in chronic kidney disease: Secondary | ICD-10-CM

## 2022-07-23 DIAGNOSIS — E11649 Type 2 diabetes mellitus with hypoglycemia without coma: Secondary | ICD-10-CM | POA: Diagnosis not present

## 2022-07-23 DIAGNOSIS — E1042 Type 1 diabetes mellitus with diabetic polyneuropathy: Secondary | ICD-10-CM

## 2022-07-23 DIAGNOSIS — I130 Hypertensive heart and chronic kidney disease with heart failure and stage 1 through stage 4 chronic kidney disease, or unspecified chronic kidney disease: Secondary | ICD-10-CM | POA: Diagnosis present

## 2022-07-23 DIAGNOSIS — N184 Chronic kidney disease, stage 4 (severe): Secondary | ICD-10-CM

## 2022-07-23 DIAGNOSIS — E1122 Type 2 diabetes mellitus with diabetic chronic kidney disease: Secondary | ICD-10-CM | POA: Diagnosis present

## 2022-07-23 DIAGNOSIS — E876 Hypokalemia: Secondary | ICD-10-CM | POA: Diagnosis present

## 2022-07-23 DIAGNOSIS — E1110 Type 2 diabetes mellitus with ketoacidosis without coma: Secondary | ICD-10-CM | POA: Diagnosis present

## 2022-07-23 DIAGNOSIS — R569 Unspecified convulsions: Secondary | ICD-10-CM | POA: Diagnosis present

## 2022-07-23 DIAGNOSIS — N179 Acute kidney failure, unspecified: Secondary | ICD-10-CM | POA: Diagnosis present

## 2022-07-23 DIAGNOSIS — Z8249 Family history of ischemic heart disease and other diseases of the circulatory system: Secondary | ICD-10-CM

## 2022-07-23 DIAGNOSIS — E871 Hypo-osmolality and hyponatremia: Secondary | ICD-10-CM

## 2022-07-23 DIAGNOSIS — Z681 Body mass index (BMI) 19 or less, adult: Secondary | ICD-10-CM | POA: Diagnosis not present

## 2022-07-23 DIAGNOSIS — E785 Hyperlipidemia, unspecified: Secondary | ICD-10-CM | POA: Diagnosis present

## 2022-07-23 DIAGNOSIS — N1832 Chronic kidney disease, stage 3b: Secondary | ICD-10-CM | POA: Diagnosis present

## 2022-07-23 DIAGNOSIS — E1140 Type 2 diabetes mellitus with diabetic neuropathy, unspecified: Secondary | ICD-10-CM | POA: Diagnosis present

## 2022-07-23 DIAGNOSIS — Z87891 Personal history of nicotine dependence: Secondary | ICD-10-CM

## 2022-07-23 DIAGNOSIS — Z7982 Long term (current) use of aspirin: Secondary | ICD-10-CM

## 2022-07-23 DIAGNOSIS — Z20822 Contact with and (suspected) exposure to covid-19: Secondary | ICD-10-CM | POA: Diagnosis present

## 2022-07-23 DIAGNOSIS — R739 Hyperglycemia, unspecified: Principal | ICD-10-CM

## 2022-07-23 DIAGNOSIS — I5032 Chronic diastolic (congestive) heart failure: Secondary | ICD-10-CM | POA: Diagnosis present

## 2022-07-23 DIAGNOSIS — Z79899 Other long term (current) drug therapy: Secondary | ICD-10-CM

## 2022-07-23 LAB — CBC WITH DIFFERENTIAL/PLATELET
Abs Immature Granulocytes: 0.01 10*3/uL (ref 0.00–0.07)
Basophils Absolute: 0 10*3/uL (ref 0.0–0.1)
Basophils Relative: 1 %
Eosinophils Absolute: 0 10*3/uL (ref 0.0–0.5)
Eosinophils Relative: 0 %
HCT: 27 % — ABNORMAL LOW (ref 36.0–46.0)
Hemoglobin: 9.2 g/dL — ABNORMAL LOW (ref 12.0–15.0)
Immature Granulocytes: 0 %
Lymphocytes Relative: 38 %
Lymphs Abs: 1.6 10*3/uL (ref 0.7–4.0)
MCH: 29.6 pg (ref 26.0–34.0)
MCHC: 34.1 g/dL (ref 30.0–36.0)
MCV: 86.8 fL (ref 80.0–100.0)
Monocytes Absolute: 0.3 10*3/uL (ref 0.1–1.0)
Monocytes Relative: 7 %
Neutro Abs: 2.3 10*3/uL (ref 1.7–7.7)
Neutrophils Relative %: 54 %
Platelets: 240 10*3/uL (ref 150–400)
RBC: 3.11 MIL/uL — ABNORMAL LOW (ref 3.87–5.11)
RDW: 12.4 % (ref 11.5–15.5)
WBC: 4.2 10*3/uL (ref 4.0–10.5)
nRBC: 0 % (ref 0.0–0.2)

## 2022-07-23 LAB — RESP PANEL BY RT-PCR (FLU A&B, COVID) ARPGX2
Influenza A by PCR: NEGATIVE
Influenza B by PCR: NEGATIVE
SARS Coronavirus 2 by RT PCR: NEGATIVE

## 2022-07-23 LAB — URINALYSIS, ROUTINE W REFLEX MICROSCOPIC
Bilirubin Urine: NEGATIVE
Glucose, UA: >=500 mg/dL — AB
Ketones, ur: NEGATIVE mg/dL
Nitrite: NEGATIVE
Protein, ur: >=300 mg/dL — AB
Specific Gravity, Urine: 1.014 (ref 1.005–1.030)
pH: 6 (ref 5.0–8.0)

## 2022-07-23 LAB — BLOOD GAS, VENOUS
Acid-base deficit: 4.1 mmol/L — ABNORMAL HIGH (ref 0.0–2.0)
Bicarbonate: 22.2 mmol/L (ref 20.0–28.0)
O2 Saturation: 56 %
Patient temperature: 37
pCO2, Ven: 44 mmHg (ref 44–60)
pH, Ven: 7.31 (ref 7.25–7.43)
pO2, Ven: <31 mmHg — CL (ref 32–45)

## 2022-07-23 LAB — COMPREHENSIVE METABOLIC PANEL
ALT: 10 U/L (ref 0–44)
AST: 15 U/L (ref 15–41)
Albumin: 3.1 g/dL — ABNORMAL LOW (ref 3.5–5.0)
Alkaline Phosphatase: 157 U/L — ABNORMAL HIGH (ref 38–126)
Anion gap: 7 (ref 5–15)
BUN: 31 mg/dL — ABNORMAL HIGH (ref 8–23)
CO2: 22 mmol/L (ref 22–32)
Calcium: 9.1 mg/dL (ref 8.9–10.3)
Chloride: 105 mmol/L (ref 98–111)
Creatinine, Ser: 2.42 mg/dL — ABNORMAL HIGH (ref 0.44–1.00)
GFR, Estimated: 22 mL/min — ABNORMAL LOW (ref >60)
Glucose, Bld: 531 mg/dL (ref 70–99)
Potassium: 3.4 mmol/L — ABNORMAL LOW (ref 3.5–5.1)
Sodium: 134 mmol/L — ABNORMAL LOW (ref 135–145)
Total Bilirubin: 0.6 mg/dL (ref 0.3–1.2)
Total Protein: 7.4 g/dL (ref 6.5–8.1)

## 2022-07-23 LAB — BETA-HYDROXYBUTYRIC ACID: Beta-Hydroxybutyric Acid: 0.45 mmol/L — ABNORMAL HIGH (ref 0.05–0.27)

## 2022-07-23 LAB — GLUCOSE, CAPILLARY
Glucose-Capillary: 148 mg/dL — ABNORMAL HIGH (ref 70–99)
Glucose-Capillary: 164 mg/dL — ABNORMAL HIGH (ref 70–99)

## 2022-07-23 LAB — CBG MONITORING, ED
Glucose-Capillary: 524 mg/dL (ref 70–99)
Glucose-Capillary: 537 mg/dL (ref 70–99)
Glucose-Capillary: 361 mg/dL — ABNORMAL HIGH (ref 70–99)
Glucose-Capillary: 228 mg/dL — ABNORMAL HIGH (ref 70–99)

## 2022-07-23 LAB — OSMOLALITY: Osmolality: 312 mOsm/kg — ABNORMAL HIGH (ref 275–295)

## 2022-07-23 MED ORDER — LACTATED RINGERS IV BOLUS
1000.0000 mL | Freq: Once | INTRAVENOUS | Status: AC
  Administered 2022-07-23: 1000 mL via INTRAVENOUS

## 2022-07-23 MED ORDER — LACTATED RINGERS IV SOLN: INTRAVENOUS | Status: DC

## 2022-07-23 MED ORDER — FENTANYL CITRATE PF 50 MCG/ML IJ SOSY
50.0000 ug | PREFILLED_SYRINGE | INTRAMUSCULAR | Status: DC | PRN
  Administered 2022-07-24: 50 ug via INTRAVENOUS
  Filled 2022-07-23: qty 1

## 2022-07-23 MED ORDER — INSULIN REGULAR(HUMAN) IN NACL 100-0.9 UT/100ML-% IV SOLN
INTRAVENOUS | Status: DC
  Administered 2022-07-23: 6 [IU]/h via INTRAVENOUS
  Filled 2022-07-23: qty 100

## 2022-07-23 MED ORDER — HYDRALAZINE HCL 20 MG/ML IJ SOLN
10.0000 mg | Freq: Once | INTRAMUSCULAR | Status: AC
  Administered 2022-07-23: 10 mg via INTRAVENOUS
  Filled 2022-07-23: qty 1

## 2022-07-23 MED ORDER — DEXTROSE 50 % IV SOLN: 0.0000 mL | INTRAVENOUS | Status: DC | PRN

## 2022-07-23 MED ORDER — HEPARIN SODIUM (PORCINE) 5000 UNIT/ML IJ SOLN
5000.0000 [IU] | Freq: Three times a day (TID) | INTRAMUSCULAR | Status: DC
  Administered 2022-07-23 – 2022-07-24 (×2): 5000 [IU] via SUBCUTANEOUS
  Filled 2022-07-23 (×2): qty 1

## 2022-07-23 MED ORDER — AMLODIPINE BESYLATE 10 MG PO TABS
10.0000 mg | ORAL_TABLET | Freq: Every day | ORAL | Status: DC
  Administered 2022-07-23 – 2022-07-28 (×6): 10 mg via ORAL
  Filled 2022-07-23: qty 1
  Filled 2022-07-23: qty 2
  Filled 2022-07-23 (×4): qty 1

## 2022-07-23 MED ORDER — POTASSIUM CHLORIDE 10 MEQ/100ML IV SOLN
10.0000 meq | Freq: Once | INTRAVENOUS | Status: AC
  Administered 2022-07-23: 10 meq via INTRAVENOUS
  Filled 2022-07-23: qty 100

## 2022-07-23 MED ORDER — DEXTROSE IN LACTATED RINGERS 5 % IV SOLN: INTRAVENOUS | Status: DC

## 2022-07-23 MED ORDER — INSULIN ASPART 100 UNIT/ML IV SOLN
5.0000 [IU] | Freq: Once | INTRAVENOUS | Status: AC
Start: 2022-07-23 — End: 2022-07-23
  Administered 2022-07-23: 5 [IU] via INTRAVENOUS
  Filled 2022-07-23: qty 0.05

## 2022-07-23 MED ORDER — LABETALOL HCL 5 MG/ML IV SOLN
10.0000 mg | INTRAVENOUS | Status: DC | PRN
Start: 2022-07-23 — End: 2022-07-28
  Administered 2022-07-23 – 2022-07-25 (×7): 10 mg via INTRAVENOUS
  Filled 2022-07-23 (×7): qty 4

## 2022-07-23 MED ORDER — POTASSIUM CHLORIDE CRYS ER 20 MEQ PO TBCR
40.0000 meq | EXTENDED_RELEASE_TABLET | Freq: Once | ORAL | Status: AC
  Administered 2022-07-23: 40 meq via ORAL
  Filled 2022-07-23: qty 2

## 2022-07-23 MED ORDER — ACETAMINOPHEN 325 MG PO TABS
650.0000 mg | ORAL_TABLET | Freq: Four times a day (QID) | ORAL | Status: DC | PRN
  Administered 2022-07-24 – 2022-07-27 (×4): 650 mg via ORAL
  Filled 2022-07-23 (×4): qty 2

## 2022-07-23 NOTE — ED Provider Notes (Signed)
[facility]-ICU/STEPDOWN Provider Note  CSN: [id number] Arrival date & time: 07/23/22 1654  Chief Complaint(s) Weakness and Hyperglycemia  HPI Tricia Huynh is a 66 y.o. female with PMH T2DM on insulin, HTN, HLD, CHF who presents emergency department for evaluation of generalized weakness, nausea, vomiting and hyperglycemia.  Patient states that symptoms been worsening over the last 3 to 4 days.  She states that she has been taking her insulin and generally has multiple associated myalgias.  She states she just feels lousy.  Denies chest pain, abdominal pain, or other systemic symptoms.  Patient arrives hypertensive and tachycardic.  States that she has been unable to take her home blood pressure medications due to her vomiting.   Past Medical History Past Medical History:  Diagnosis Date   Diabetes mellitus type 2 in nonobese ([place]) 06/06/2015   Diabetes mellitus without complication ([place])    Diabetic neuropathy ([place]) 06/06/2015   Dyslipidemia 06/06/2015   Encephalopathy    Essential hypertension 06/06/2015   Hypertension    Mood disorder ([place]) 06/06/2015   Pneumonia 03/11/2017   Seizure ([place])    sees [name]. abd eeg, on keppra   Patient Active Problem List   Diagnosis Date Noted   Hypertensive crisis 07/23/2022   Hypokalemia 07/23/2022   Malnutrition of moderate degree 04/29/2022   Hyperglycemia due to type 2 diabetes mellitus ([place]) 04/27/2022   Focal neurological deficit 04/27/2022   Scalp cyst 04/27/2022   Chronic diastolic CHF (congestive heart failure) ([place]) 04/27/2022   AIN grade I 12/16/2021   External hemorrhoids with complication 37/85/8850   Colitis 12/15/2020   HCAP (healthcare-associated pneumonia) 03/11/2017   Dyslipidemia 03/11/2017   Diabetes mellitus with complication ([place])    Chest pain 12/30/2016   Seizure disorder ([place]) 12/30/2016   Diabetic ketoacidosis without coma associated with type 2 diabetes mellitus ([place])    DKA (diabetic  ketoacidoses) 11/15/2016   UTI (urinary tract infection) 11/15/2016   Acute renal failure superimposed on stage 3b chronic kidney disease ([place])    Hyperglycemia 11/14/2016   Diabetes ([place]) 06/05/2016   CKD (chronic kidney disease) stage 3, GFR 30-59 ml/min ([place]) 05/26/2016   Benign essential HTN 05/26/2016   Seizures ([place]) 01/08/2016   Memory loss 10/04/2015   Diabetic neuropathy ([place]) 06/06/2015   Home Medication(s) Prior to Admission medications   Medication Sig Start Date End Date Taking? Authorizing Provider  amLODipine-olmesartan (AZOR) 10-40 MG tablet Take 1 tablet by mouth daily.   Yes [provider]  Accu-Chek Softclix Lancets lancets 1 each by Other route 2 (two) times daily. E11.9 12/20/19   Renato Shin, MD  acetaminophen (TYLENOL) 500 MG tablet Take 1,000 mg by mouth every 6 (six) hours as needed for mild pain or headache.    [provider]  amitriptyline (ELAVIL) 50 MG tablet Take 50 mg by mouth daily. 05/08/15   [provider]  amLODipine-olmesartan (AZOR) 5-40 MG tablet Take 1 tablet by mouth daily. 10/08/16   [provider]  aspirin EC 81 MG tablet Take 81 mg by mouth daily.    [provider]  atorvastatin (LIPITOR) 40 MG tablet Take 40 mg by mouth daily. 12/23/19   [provider]  Blood Glucose Monitoring Suppl (ACCU-CHEK AVIVA PLUS) w/Device KIT 1 each by Does not apply route 2 (two) times daily. E11.9 12/20/19   Renato Shin, MD  feeding supplement, GLUCERNA SHAKE, (GLUCERNA SHAKE) LIQD Take 237 mLs by mouth 3 (three) times daily between meals. 05/01/22   Kerney Elbe,  DO  ferrous sulfate 325 (65 FE) MG tablet Take 1 tablet (325 mg total) by mouth daily with breakfast. 05/02/22   Raiford Noble Latif, DO  gabapentin (NEURONTIN) 100 MG capsule Take 900 mg by mouth at bedtime. 12/23/19   [provider]  glucose blood (ACCU-CHEK AVIVA PLUS) test strip 1 each by Other route 2 (two) times daily. E11.9 12/20/19    Renato Shin, MD  hydrocortisone (ANUSOL-HC) 2.5 % rectal cream Apply 1 application topically 2 (two) times daily. 03/25/21   [provider]  insulin glargine (LANTUS SOLOSTAR) 100 UNIT/ML Solostar Pen Inject 10 Units into the skin daily. And pen needles 1/day 05/01/22   Raiford Noble Latif, DO  Multiple Vitamin (MULTIVITAMIN WITH MINERALS) TABS tablet Take 1 tablet by mouth daily. 05/02/22   Sheikh, Georgina Quint Latif, DO  polyethylene glycol (MIRALAX / GLYCOLAX) 17 g packet Take 17 g by mouth daily. 05/01/22   Sheikh, Omair Latif, DO  senna-docusate (SENOKOT-S) 8.6-50 MG tablet Take 1 tablet by mouth at bedtime. 05/01/22   Kerney Elbe, DO                                                                                                                                    Past Surgical History Past Surgical History:  Procedure Laterality Date   ABDOMINAL HYSTERECTOMY     EYE SURGERY Bilateral    TUBAL LIGATION     Family History Family History  Problem Relation Age of Onset   Heart disease Father    Kidney disease Father    Diabetes Mother    Seizures Sister    Seizures Brother     Social History Social History   Tobacco Use   Smoking status: Former    Types: Cigarettes   Smokeless tobacco: Never  Vaping Use   Vaping Use: Never used  Substance Use Topics   Alcohol use: No   Drug use: No   Allergies Percocet [oxycodone-acetaminophen] and Vicodin [hydrocodone-acetaminophen]  Review of Systems Review of Systems  Gastrointestinal:  Positive for nausea and vomiting.    Physical Exam Vital Signs  I have reviewed the triage vital signs BP (!) 245/136 (BP Location: Right Arm)   Pulse 95   Temp 99.9 F (37.7 C) (Oral)   Resp 12   Ht _0  (1.727 m)   Wt 47.9 kg   SpO2 100%   BMI 16.06 kg/m   Physical Exam Vitals and nursing note reviewed.  Constitutional:      General: She is not in acute distress.    Appearance: She is well-developed. She is ill-appearing.   HENT:     Head: Normocephalic and atraumatic.  Eyes:     Conjunctiva/sclera: Conjunctivae normal.  Cardiovascular:     Rate and Rhythm: Regular rhythm. Tachycardia present.     Heart sounds: No murmur heard. Pulmonary:     Effort: Pulmonary effort is normal. No respiratory distress.  Breath sounds: Normal breath sounds.  Abdominal:     Palpations: Abdomen is soft.     Tenderness: There is no abdominal tenderness.  Musculoskeletal:        General: No swelling.     Cervical back: Neck supple.  Skin:    General: Skin is warm and dry.     Capillary Refill: Capillary refill takes less than 2 seconds.  Neurological:     Mental Status: She is alert.  Psychiatric:        Mood and Affect: Mood normal.     ED Results and Treatments Labs (all labs ordered are listed, but only abnormal results are displayed) Labs Reviewed  CBC WITH DIFFERENTIAL/PLATELET - Abnormal; Notable for the following components:      Result Value   RBC 3.11 (*)    Hemoglobin 9.2 (*)    HCT 27.0 (*)    All other components within normal limits  COMPREHENSIVE METABOLIC PANEL - Abnormal; Notable for the following components:   Sodium 134 (*)    Potassium 3.4 (*)    Glucose, Bld 531 (*)    BUN 31 (*)    Creatinine, Ser 2.42 (*)    Albumin 3.1 (*)    Alkaline Phosphatase 157 (*)    GFR, Estimated 22 (*)    All other components within normal limits  BLOOD GAS, VENOUS - Abnormal; Notable for the following components:   pO2, Ven <31 (*)    Acid-base deficit 4.1 (*)    All other components within normal limits  URINALYSIS, ROUTINE W REFLEX MICROSCOPIC - Abnormal; Notable for the following components:   Color, Urine STRAW (*)    Glucose, UA >=500 (*)    Hgb urine dipstick SMALL (*)    Protein, ur >=300 (*)    Leukocytes,Ua SMALL (*)    Bacteria, UA RARE (*)    All other components within normal limits  BETA-HYDROXYBUTYRIC ACID - Abnormal; Notable for the following components:   Beta-Hydroxybutyric  Acid 0.45 (*)    All other components within normal limits  GLUCOSE, CAPILLARY - Abnormal; Notable for the following components:   Glucose-Capillary 148 (*)    All other components within normal limits  CBG MONITORING, ED - Abnormal; Notable for the following components:   Glucose-Capillary 524 (*)    All other components within normal limits  CBG MONITORING, ED - Abnormal; Notable for the following components:   Glucose-Capillary 537 (*)    All other components within normal limits  CBG MONITORING, ED - Abnormal; Notable for the following components:   Glucose-Capillary 361 (*)    All other components within normal limits  CBG MONITORING, ED - Abnormal; Notable for the following components:   Glucose-Capillary 228 (*)    All other components within normal limits  RESP PANEL BY RT-PCR (FLU A&B, COVID) ARPGX2  OSMOLALITY  BASIC METABOLIC PANEL  MAGNESIUM  Radiology CT HEAD WO CONTRAST (5MM)  Result Date: 07/23/2022 CLINICAL DATA:  Headache. EXAM: CT HEAD WITHOUT CONTRAST TECHNIQUE: Contiguous axial images were obtained from the base of the skull through the vertex without intravenous contrast. RADIATION DOSE REDUCTION: This exam was performed according to the departmental dose-optimization program which includes automated exposure control, adjustment of the mA and/or kV according to patient size and/or use of iterative reconstruction technique. COMPARISON:  Head CT dated 04/27/2022. FINDINGS: Brain: Mild age-related atrophy and chronic microvascular ischemic changes. There is no acute intracranial hemorrhage. No mass effect or midline shift. No extra-axial fluid collection. Vascular: No hyperdense vessel or unexpected calcification. Skull: Normal. Negative for fracture or focal lesion. Sinuses/Orbits: Mild diffuse mucoperiosteal thickening of paranasal sinuses. No air-fluid  level. The mastoid air cells are clear. Other: None IMPRESSION: 1. No acute intracranial pathology. 2. Mild age-related atrophy and chronic microvascular ischemic changes. Electronically Signed   By: Anner Crete M.D.   On: 07/23/2022 19:26    Pertinent labs & imaging results that were available during my care of the patient were reviewed by me and considered in my medical decision making (see MDM for details).  Medications Ordered in ED Medications  labetalol (NORMODYNE) injection 10 mg (10 mg Intravenous Given 07/23/22 2304)  heparin injection 5,000 Units (5,000 Units Subcutaneous Given 07/23/22 2154)  insulin regular, human (MYXREDLIN) 100 units/ 100 mL infusion (2.4 Units/hr Intravenous Rate/Dose Change 07/23/22 2155)  lactated ringers infusion (0 mLs Intravenous Stopped 07/23/22 2203)  dextrose 5 % in lactated ringers infusion ( Intravenous New Bag/Given 07/23/22 2159)  dextrose 50 % solution 0-50 mL (has no administration in time range)  acetaminophen (TYLENOL) tablet 650 mg (has no administration in time range)  fentaNYL (SUBLIMAZE) injection 50 mcg (has no administration in time range)  amLODipine (NORVASC) tablet 10 mg (10 mg Oral Given 07/23/22 2157)  lactated ringers bolus 1,000 mL (0 mLs Intravenous Stopped 07/23/22 2203)  hydrALAZINE (APRESOLINE) injection 10 mg (10 mg Intravenous Given 07/23/22 1849)  insulin aspart (novoLOG) injection 5 Units (5 Units Intravenous Given 07/23/22 1947)  potassium chloride SA (KLOR-CON M) CR tablet 40 mEq (40 mEq Oral Given 07/23/22 1948)  potassium chloride 10 mEq in 100 mL IVPB (0 mEq Intravenous Stopped 07/23/22 2203)                                                                                                                                     Procedures .Critical Care  Performed by: Teressa Lower, MD Authorized by: Teressa Lower, MD   Critical care provider statement:    Critical care time (minutes):  30   Critical care was necessary to treat or  prevent imminent or life-threatening deterioration of the following conditions:  Endocrine crisis   Critical care was time spent personally by me on the following activities:  Development of treatment plan with patient or surrogate, discussions with consultants, evaluation of patient's response to  treatment, examination of patient, ordering and review of laboratory studies, ordering and review of radiographic studies, ordering and performing treatments and interventions, pulse oximetry, re-evaluation of patient's condition and review of old charts   (including critical care time)  Medical Decision Making / ED Course   This patient presents to the ED for concern of weakness, hypoglycemia, fatigue, this involves an extensive number of treatment options, and is a complaint that carries with it a high risk of complications and morbidity.  The differential diagnosis includes DKA, HHS, stress hyperglycemia, viral illness, electrolyte abnormality  MDM: Seen in the emergency room for evaluation of multiple complaints described above.  Physical exam reveals a ill-appearing, fatigued tachycardic patient but is otherwise unremarkable.  Laboratory evaluation with a hemoglobin of 9.2, potassium 3.4, glucose elevated to 531, BUN 31, creatinine 2.42 which is an elevation for this patient, beta-hydroxybutyrate minimally elevated at 0.45 and pH is 7.31.  Potassium was repleted prior to insulin administration and hydralazine was given for the patient's high blood pressure.  Fluid resuscitation begun and patient admitted for early DKA.   Additional history obtained:  -External records from outside source obtained and reviewed including: Chart review including previous notes, labs, imaging, consultation notes   Lab Tests: -I ordered, reviewed, and interpreted labs.   The pertinent results include:   Labs Reviewed  CBC WITH DIFFERENTIAL/PLATELET - Abnormal; Notable for the following components:      Result Value    RBC 3.11 (*)    Hemoglobin 9.2 (*)    HCT 27.0 (*)    All other components within normal limits  COMPREHENSIVE METABOLIC PANEL - Abnormal; Notable for the following components:   Sodium 134 (*)    Potassium 3.4 (*)    Glucose, Bld 531 (*)    BUN 31 (*)    Creatinine, Ser 2.42 (*)    Albumin 3.1 (*)    Alkaline Phosphatase 157 (*)    GFR, Estimated 22 (*)    All other components within normal limits  BLOOD GAS, VENOUS - Abnormal; Notable for the following components:   pO2, Ven <31 (*)    Acid-base deficit 4.1 (*)    All other components within normal limits  URINALYSIS, ROUTINE W REFLEX MICROSCOPIC - Abnormal; Notable for the following components:   Color, Urine STRAW (*)    Glucose, UA >=500 (*)    Hgb urine dipstick SMALL (*)    Protein, ur >=300 (*)    Leukocytes,Ua SMALL (*)    Bacteria, UA RARE (*)    All other components within normal limits  BETA-HYDROXYBUTYRIC ACID - Abnormal; Notable for the following components:   Beta-Hydroxybutyric Acid 0.45 (*)    All other components within normal limits  GLUCOSE, CAPILLARY - Abnormal; Notable for the following components:   Glucose-Capillary 148 (*)    All other components within normal limits  CBG MONITORING, ED - Abnormal; Notable for the following components:   Glucose-Capillary 524 (*)    All other components within normal limits  CBG MONITORING, ED - Abnormal; Notable for the following components:   Glucose-Capillary 537 (*)    All other components within normal limits  CBG MONITORING, ED - Abnormal; Notable for the following components:   Glucose-Capillary 361 (*)    All other components within normal limits  CBG MONITORING, ED - Abnormal; Notable for the following components:   Glucose-Capillary 228 (*)    All other components within normal limits  RESP PANEL BY RT-PCR (FLU A&B, COVID) ARPGX2  OSMOLALITY  BASIC METABOLIC PANEL  MAGNESIUM      EKG   EKG Interpretation  Date/Time:  Wednesday July 23 2022 17:11:15 EDT Ventricular Rate:  99 PR Interval:  169 QRS Duration: 90 QT Interval:  366 QTC Calculation: 470 R Axis:   -22 Text Interpretation: Sinus rhythm Left atrial enlargement Borderline left axis deviation Confirmed by [place] ([id number]) on 07/23/2022 6:05:18 PM         Imaging Studies ordered: I ordered imaging studies including CT head I independently visualized and interpreted imaging. I agree with the radiologist interpretation   Medicines ordered and prescription drug management: Meds ordered this encounter  Medications   lactated ringers bolus 1,000 mL   hydrALAZINE (APRESOLINE) injection 10 mg   insulin aspart (novoLOG) injection 5 Units   potassium chloride SA (KLOR-CON M) CR tablet 40 mEq   potassium chloride 10 mEq in 100 mL IVPB   labetalol (NORMODYNE) injection 10 mg   heparin injection 5,000 Units   insulin regular, human (MYXREDLIN) 100 units/ 100 mL infusion    Order Specific Question:   EndoTool low target:    Answer:   140    Order Specific Question:   EndoTool high target:    Answer:   180    Order Specific Question:   Type of Diabetes    Answer:   Type 2    Order Specific Question:   Mode of Therapy    Answer:   Hyperglycemia    Order Specific Question:   Start Method    Answer:   EndoTool to calculate   lactated ringers infusion   dextrose 5 % in lactated ringers infusion   dextrose 50 % solution 0-50 mL   acetaminophen (TYLENOL) tablet 650 mg   fentaNYL (SUBLIMAZE) injection 50 mcg   amLODipine (NORVASC) tablet 10 mg    -I have reviewed the patients home medicines and have made adjustments as needed  Critical interventions Fluid resuscitation, insulin, potassium repletion  Cardiac Monitoring: The patient was maintained on a cardiac monitor.  I personally viewed and interpreted the cardiac monitored which showed an underlying rhythm of: Sinus tachycardia  Social Determinants of Health:  Factors impacting patients care include:  none   Reevaluation: After the interventions noted above, I reevaluated the patient and found that they have :improved  Co morbidities that complicate the patient evaluation  Past Medical History:  Diagnosis Date   Diabetes mellitus type 2 in nonobese ([place]) 06/06/2015   Diabetes mellitus without complication ([place])    Diabetic neuropathy ([place]) 06/06/2015   Dyslipidemia 06/06/2015   Encephalopathy    Essential hypertension 06/06/2015   Hypertension    Mood disorder ([place]) 06/06/2015   Pneumonia 03/11/2017   Seizure ([place])    sees [name]. abd eeg, on keppra      Dispostion: I considered admission for this patient, and due to severe hyperglycemia, patient require hospital admission     Final Clinical Impression(s) / ED Diagnoses Final diagnoses:  None     _0 @    Teressa Lower, MD 07/23/22 2309

## 2022-07-23 NOTE — ED Triage Notes (Signed)
Pt presents with c/o weakness for several days. Pt also reports she's been having some blurred vision and a headache. Pt is hyperglycemic at 524 in triage and hypertensive as well. Pt reports she is on blood pressure medication.

## 2022-07-23 NOTE — H&P (Signed)
History and Physical    Tricia Huynh EHO:[id number] DOB: [birth year] DOA: 07/23/2022  PCP: Jonathon Jordan, MD   Patient coming from: home   Chief Complaint: Headache, blurred vision, N/V   HPI: Tricia Huynh is a 66 y.o. female with medical history significant for hypertension, insulin-dependent diabetes mellitus, and CKD 3A who now presents emergency department with headache, blurred vision, nausea, and vomiting.    Patient reports that she developed a mild headache 2 days ago, has had some blurred vision over the past 2 days, and has also had nausea and vomited when trying to take her blood pressure medication.  She reports adherence with her insulin.  She denies chest pain, focal numbness or weakness, fever, or chills.  ED Course: Upon arrival to the ED, patient is found to be hypertensive with SBP as high as 243.  EKG features sinus rhythm and head CT negative for acute intracranial abnormality.  Chemistry panel notable for glucose of 531 and creatinine 2.42.  CBC with stable normocytic anemia.    Patient was given a liter of LR, oral and IV potassium, 5 units of NovoLog, and 10 mg IV hydralazine in the ED.  Review of Systems:  All other systems reviewed and apart from HPI, are negative.  Past Medical History:  Diagnosis Date   Diabetes mellitus type 2 in nonobese ([place]) 06/06/2015   Diabetes mellitus without complication ([place])    Diabetic neuropathy ([place]) 06/06/2015   Dyslipidemia 06/06/2015   Encephalopathy    Essential hypertension 06/06/2015   Hypertension    Mood disorder ([place]) 06/06/2015   Pneumonia 03/11/2017   Seizure ([place])    sees [name]. abd eeg, on keppra    Past Surgical History:  Procedure Laterality Date   ABDOMINAL HYSTERECTOMY     EYE SURGERY Bilateral    TUBAL LIGATION      Social History:   reports that she has quit smoking. Her smoking use included cigarettes. She has never used smokeless tobacco. She reports that she does not drink alcohol  and does not use drugs.  Allergies  Allergen Reactions   Percocet [Oxycodone-Acetaminophen] Nausea And Vomiting and Other (See Comments)    Causes the patient to be shaky/unsteady, also   Vicodin [Hydrocodone-Acetaminophen] Nausea And Vomiting and Other (See Comments)    Causes the patient to be shaky/unsteady, also    Family History  Problem Relation Age of Onset   Heart disease Father    Kidney disease Father    Diabetes Mother    Seizures Sister    Seizures Brother      Prior to Admission medications   Medication Sig Start Date End Date Taking? Authorizing Provider  amLODipine-olmesartan (AZOR) 10-40 MG tablet Take 1 tablet by mouth daily.   Yes [provider]  Accu-Chek Softclix Lancets lancets 1 each by Other route 2 (two) times daily. E11.9 12/20/19   Renato Shin, MD  acetaminophen (TYLENOL) 500 MG tablet Take 1,000 mg by mouth every 6 (six) hours as needed for mild pain or headache.    [provider]  amitriptyline (ELAVIL) 50 MG tablet Take 50 mg by mouth daily. 05/08/15   [provider]  amLODipine-olmesartan (AZOR) 5-40 MG tablet Take 1 tablet by mouth daily. 10/08/16   [provider]  aspirin EC 81 MG tablet Take 81 mg by mouth daily.    [provider]  atorvastatin (LIPITOR) 40 MG tablet Take 40 mg by mouth daily. 12/23/19   [provider]  Blood Glucose  Monitoring Suppl (ACCU-CHEK AVIVA PLUS) w/Device KIT 1 each by Does not apply route 2 (two) times daily. E11.9 12/20/19   Renato Shin, MD  feeding supplement, GLUCERNA SHAKE, (GLUCERNA SHAKE) LIQD Take 237 mLs by mouth 3 (three) times daily between meals. 05/01/22   Raiford Noble Latif, DO  ferrous sulfate 325 (65 FE) MG tablet Take 1 tablet (325 mg total) by mouth daily with breakfast. 05/02/22   Raiford Noble Latif, DO  gabapentin (NEURONTIN) 100 MG capsule Take 900 mg by mouth at bedtime. 12/23/19   [provider]  glucose blood (ACCU-CHEK AVIVA PLUS) test  strip 1 each by Other route 2 (two) times daily. E11.9 12/20/19   Renato Shin, MD  hydrocortisone (ANUSOL-HC) 2.5 % rectal cream Apply 1 application topically 2 (two) times daily. 03/25/21   [provider]  insulin glargine (LANTUS SOLOSTAR) 100 UNIT/ML Solostar Pen Inject 10 Units into the skin daily. And pen needles 1/day 05/01/22   Raiford Noble Latif, DO  Multiple Vitamin (MULTIVITAMIN WITH MINERALS) TABS tablet Take 1 tablet by mouth daily. 05/02/22   Sheikh, Georgina Quint Latif, DO  polyethylene glycol (MIRALAX / GLYCOLAX) 17 g packet Take 17 g by mouth daily. 05/01/22   Sheikh, Omair Latif, DO  senna-docusate (SENOKOT-S) 8.6-50 MG tablet Take 1 tablet by mouth at bedtime. 05/01/22   Kerney Elbe, DO    Physical Exam: Vitals:   07/23/22 1930 07/23/22 2151 07/23/22 2153 07/23/22 2200  BP: (!) 216/107 (!) 209/118  (!) 220/122  Pulse: 96  90 94  Resp: _0 Temp:    98.2 F (36.8 C)  TempSrc:    Oral  SpO2: 100%  100% 100%    Constitutional: NAD, no diaphoresis, no pallor  Eyes: PERTLA, lids and conjunctivae normal ENMT: Mucous membranes are moist. Posterior pharynx clear of any exudate or lesions.   Neck: supple, no masses  Respiratory: no wheezing, no crackles. No accessory muscle use.  Cardiovascular: S1 & S2 heard, regular rate and rhythm. No extremity edema.   Abdomen: No distension, no tenderness, soft. Bowel sounds active.  Musculoskeletal: no clubbing / cyanosis. No joint deformity upper and lower extremities.   Skin: no significant rashes, lesions, ulcers. Warm, dry, well-perfused. Neurologic: CN 2-12 grossly intact. Sensation intact. Strength 5/5 in all 4 limbs. Alert and oriented.  Psychiatric: Calm. Cooperative.    Labs and Imaging on Admission: I have personally reviewed following labs and imaging studies  CBC: Recent Labs  Lab 07/23/22 1724  WBC 4.2  NEUTROABS 2.3  HGB 9.2*  HCT 27.0*  MCV 86.8  PLT 350   Basic Metabolic Panel: Recent Labs   Lab 07/23/22 1724  NA 134*  K 3.4*  CL 105  CO2 22  GLUCOSE 531*  BUN 31*  CREATININE 2.42*  CALCIUM 9.1   GFR: CrCl cannot be calculated (Unknown ideal weight.). Liver Function Tests: Recent Labs  Lab 07/23/22 1724  AST 15  ALT 10  ALKPHOS 157*  BILITOT 0.6  PROT 7.4  ALBUMIN 3.1*   No results for input(s): "LIPASE", "AMYLASE" in the last 168 hours. No results for input(s): "AMMONIA" in the last 168 hours. Coagulation Profile: No results for input(s): "INR", "PROTIME" in the last 168 hours. Cardiac Enzymes: No results for input(s): "CKTOTAL", "CKMB", "CKMBINDEX", "TROPONINI" in the last 168 hours. BNP (last 3 results) No results for input(s): "PROBNP" in the last 8760 hours. HbA1C: No results for input(s): "HGBA1C" in the last 72 hours. CBG: Recent Labs  Lab  07/23/22 1710 07/23/22 1752 07/23/22 2035 07/23/22 2150  GLUCAP 524* 537* 361* 228*   Lipid Profile: No results for input(s): "CHOL", "HDL", "LDLCALC", "TRIG", "CHOLHDL", "LDLDIRECT" in the last 72 hours. Thyroid Function Tests: No results for input(s): "TSH", "T4TOTAL", "FREET4", "T3FREE", "THYROIDAB" in the last 72 hours. Anemia Panel: No results for input(s): "VITAMINB12", "FOLATE", "FERRITIN", "TIBC", "IRON", "RETICCTPCT" in the last 72 hours. Urine analysis:    Component Value Date/Time   COLORURINE STRAW (A) 07/23/2022 1958   APPEARANCEUR CLEAR 07/23/2022 1958   LABSPEC 1.014 07/23/2022 1958   PHURINE 6.0 07/23/2022 1958   GLUCOSEU >=500 (A) 07/23/2022 1958   HGBUR SMALL (A) 07/23/2022 [place] NEGATIVE 07/23/2022 [place] NEGATIVE 07/23/2022 1958   PROTEINUR >=300 (A) 07/23/2022 1958   UROBILINOGEN 0.2 09/18/2015 1327   NITRITE NEGATIVE 07/23/2022 1958   LEUKOCYTESUR SMALL (A) 07/23/2022 1958   Sepsis Labs: _0 (procalcitonin:4,lacticidven:4) ) Recent Results (from the past 240 hour(s))  Resp Panel by RT-PCR (Flu A&B, Covid) Anterior Nasal Swab     Status: None    Collection Time: 07/23/22  6:51 PM   Specimen: Anterior Nasal Swab  Result Value Ref Range Status   SARS Coronavirus 2 by RT PCR NEGATIVE NEGATIVE Final    Comment: (NOTE) SARS-CoV-2 target nucleic acids are NOT DETECTED.  The SARS-CoV-2 RNA is generally detectable in upper respiratory specimens during the acute phase of infection. The lowest concentration of SARS-CoV-2 viral copies this assay can detect is 138 copies/mL. A negative result does not preclude SARS-Cov-2 infection and should not be used as the sole basis for treatment or other patient management decisions. A negative result may occur with  improper specimen collection/handling, submission of specimen other than nasopharyngeal swab, presence of viral mutation(s) within the areas targeted by this assay, and inadequate number of viral copies(<138 copies/mL). A negative result must be combined with clinical observations, patient history, and epidemiological information. The expected result is Negative.  Fact Sheet for Patients:  EntrepreneurPulse.com.au  Fact Sheet for Healthcare Providers:  IncredibleEmployment.be  This test is no t yet approved or cleared by the [place] FDA and  has been authorized for detection and/or diagnosis of SARS-CoV-2 by FDA under an Emergency Use Authorization (EUA). This EUA will remain  in effect (meaning this test can be used) for the duration of the COVID-19 declaration under Section 564(b)(1) of the Act, 21 U.S.C.section 360bbb-3(b)(1), unless the authorization is terminated  or revoked sooner.       Influenza A by PCR NEGATIVE NEGATIVE Final   Influenza B by PCR NEGATIVE NEGATIVE Final    Comment: (NOTE) The Xpert Xpress SARS-CoV-2/FLU/RSV plus assay is intended as an aid in the diagnosis of influenza from Nasopharyngeal swab specimens and should not be used as a sole basis for treatment. Nasal washings and aspirates are unacceptable for  Xpert Xpress SARS-CoV-2/FLU/RSV testing.  Fact Sheet for Patients: EntrepreneurPulse.com.au  Fact Sheet for Healthcare Providers: IncredibleEmployment.be  This test is not yet approved or cleared by the [place] FDA and has been authorized for detection and/or diagnosis of SARS-CoV-2 by FDA under an Emergency Use Authorization (EUA). This EUA will remain in effect (meaning this test can be used) for the duration of the COVID-19 declaration under Section 564(b)(1) of the Act, 21 U.S.C. section 360bbb-3(b)(1), unless the authorization is terminated or revoked.  Performed at [facility], [place] [address] [place], [place] [address]      Radiological Exams on Admission: [place] (  5MM)  Result Date: 07/23/2022 CLINICAL DATA:  Headache. EXAM: CT HEAD WITHOUT CONTRAST TECHNIQUE: Contiguous axial images were obtained from the base of the skull through the vertex without intravenous contrast. RADIATION DOSE REDUCTION: This exam was performed according to the departmental dose-optimization program which includes automated exposure control, adjustment of the mA and/or kV according to patient size and/or use of iterative reconstruction technique. COMPARISON:  Head CT dated 04/27/2022. FINDINGS: Brain: Mild age-related atrophy and chronic microvascular ischemic changes. There is no acute intracranial hemorrhage. No mass effect or midline shift. No extra-axial fluid collection. Vascular: No hyperdense vessel or unexpected calcification. Skull: Normal. Negative for fracture or focal lesion. Sinuses/Orbits: Mild diffuse mucoperiosteal thickening of paranasal sinuses. No air-fluid level. The mastoid air cells are clear. Other: None IMPRESSION: 1. No acute intracranial pathology. 2. Mild age-related atrophy and chronic microvascular ischemic changes. Electronically Signed   By: Anner Crete M.D.   On: 07/23/2022 19:26    EKG:  Independently reviewed. SR.   Assessment/Plan   1. Hypertensive crisis  - Presents with headache and nausea and found to have SBP in 200s and serum glucose 500s - She reports vomiting when trying to take her antihypertensive this am  - No acute findings on head CT  - Give amlodipine now, use IV labetalol to lower by 10-20% now and then additional 5-15% over 24 hrs    2. Uncontrolled insulin-dependent DM  - A1c was 10.3% in June 2023  - Serum glucose is 531 in ED without DKA  - Start IV insulin infusion for now with frequent CBGs, continue IVF hydration    3. AKI superimposed on CKD IIIb  - SCr is 2.42 on admission, up from 1.85 in June 2023   - Hold ARB, continue IVF hydration, renally-dose medications, repeat serum chemistries in am    4. Hypokalemia  - Replacing     DVT prophylaxis: sq heparin  Code Status: Full  Level of Care: Level of care: Stepdown Family Communication: none present  Disposition Plan:  Patient is from: home  Anticipated d/c is to: Home  Anticipated d/c date is: 07/26/22  Patient currently: Pending BP-control, glycemic-control, tolerance of adequate oral intake  Consults called: none  Admission status: Inpatient     Vianne Bulls, MD [facility]  07/23/2022, 10:36 PM

## 2022-07-23 NOTE — ED Provider Triage Note (Signed)
Emergency Medicine Provider Triage Evaluation Note  Lynnet Hefley , a 66 y.o. female  was evaluated in triage.  Pt complains of not feeling well.  States she has noticed her blood sugar has been increased.  States she feels "off."  She admits to intermittent headache over the last few months.  States her blood sugars are high she has blurred vision.  Feels weak all over however denies any focal weakness.  No symptoms of thunderclap headache.  Admits to polyuria and polydipsia Review of Systems  Positive: Headache, blurred vision, hyperglycemia, blurred vision Negative:   Physical Exam  BP (!) 197/132 (BP Location: Left Arm)   Pulse 99   Temp 98.8 F (37.1 C) (Oral)   Resp 18   SpO2 100%  Gen:   Awake, no distress   Resp:  Normal effort  MSK:   Moves extremities without difficulty  Other:    Medical Decision Making  Medically screening exam initiated at 5:25 PM.  Appropriate orders placed.  [name] was informed that the remainder of the evaluation will be completed by another provider, this initial triage assessment does not replace that evaluation, and the importance of remaining in the ED until their evaluation is complete.     Vaiden Adames A, PA-C 07/23/22 1727

## 2022-07-24 ENCOUNTER — Other Ambulatory Visit: Payer: Self-pay

## 2022-07-24 DIAGNOSIS — E1110 Type 2 diabetes mellitus with ketoacidosis without coma: Secondary | ICD-10-CM | POA: Diagnosis not present

## 2022-07-24 DIAGNOSIS — I169 Hypertensive crisis, unspecified: Secondary | ICD-10-CM | POA: Diagnosis not present

## 2022-07-24 DIAGNOSIS — I5032 Chronic diastolic (congestive) heart failure: Secondary | ICD-10-CM | POA: Diagnosis not present

## 2022-07-24 DIAGNOSIS — N179 Acute kidney failure, unspecified: Secondary | ICD-10-CM | POA: Diagnosis not present

## 2022-07-24 LAB — MAGNESIUM: Magnesium: 1.7 mg/dL (ref 1.7–2.4)

## 2022-07-24 LAB — BASIC METABOLIC PANEL
Anion gap: 6 (ref 5–15)
BUN: 25 mg/dL — ABNORMAL HIGH (ref 8–23)
CO2: 21 mmol/L — ABNORMAL LOW (ref 22–32)
Calcium: 9 mg/dL (ref 8.9–10.3)
Chloride: 113 mmol/L — ABNORMAL HIGH (ref 98–111)
Creatinine, Ser: 2.19 mg/dL — ABNORMAL HIGH (ref 0.44–1.00)
GFR, Estimated: 24 mL/min — ABNORMAL LOW (ref >60)
Glucose, Bld: 168 mg/dL — ABNORMAL HIGH (ref 70–99)
Potassium: 3.3 mmol/L — ABNORMAL LOW (ref 3.5–5.1)
Sodium: 140 mmol/L (ref 135–145)

## 2022-07-24 LAB — GLUCOSE, CAPILLARY
Glucose-Capillary: 131 mg/dL — ABNORMAL HIGH (ref 70–99)
Glucose-Capillary: 139 mg/dL — ABNORMAL HIGH (ref 70–99)
Glucose-Capillary: 145 mg/dL — ABNORMAL HIGH (ref 70–99)
Glucose-Capillary: 148 mg/dL — ABNORMAL HIGH (ref 70–99)
Glucose-Capillary: 157 mg/dL — ABNORMAL HIGH (ref 70–99)
Glucose-Capillary: 157 mg/dL — ABNORMAL HIGH (ref 70–99)
Glucose-Capillary: 166 mg/dL — ABNORMAL HIGH (ref 70–99)
Glucose-Capillary: 244 mg/dL — ABNORMAL HIGH (ref 70–99)
Glucose-Capillary: 262 mg/dL — ABNORMAL HIGH (ref 70–99)

## 2022-07-24 LAB — BETA-HYDROXYBUTYRIC ACID: Beta-Hydroxybutyric Acid: 0.05 mmol/L (ref 0.05–0.27)

## 2022-07-24 LAB — MRSA NEXT GEN BY PCR, NASAL: MRSA by PCR Next Gen: NOT DETECTED

## 2022-07-24 MED ORDER — INSULIN ASPART 100 UNIT/ML IJ SOLN
0.0000 [IU] | Freq: Every day | INTRAMUSCULAR | Status: DC
  Administered 2022-07-24: 2 [IU] via SUBCUTANEOUS
  Administered 2022-07-26: 3 [IU] via SUBCUTANEOUS

## 2022-07-24 MED ORDER — ASPIRIN 81 MG PO TBEC
81.0000 mg | DELAYED_RELEASE_TABLET | Freq: Every day | ORAL | Status: DC
  Administered 2022-07-24 – 2022-07-27 (×4): 81 mg via ORAL
  Filled 2022-07-24 (×4): qty 1

## 2022-07-24 MED ORDER — ATORVASTATIN CALCIUM 40 MG PO TABS
40.0000 mg | ORAL_TABLET | Freq: Every day | ORAL | Status: DC
  Administered 2022-07-24 – 2022-07-28 (×5): 40 mg via ORAL
  Filled 2022-07-24 (×5): qty 1

## 2022-07-24 MED ORDER — SPIRONOLACTONE 25 MG PO TABS
25.0000 mg | ORAL_TABLET | Freq: Every day | ORAL | Status: DC
  Administered 2022-07-25 – 2022-07-27 (×3): 25 mg via ORAL
  Filled 2022-07-24 (×3): qty 1

## 2022-07-24 MED ORDER — FENTANYL CITRATE PF 50 MCG/ML IJ SOSY
25.0000 ug | PREFILLED_SYRINGE | Freq: Once | INTRAMUSCULAR | Status: AC
  Administered 2022-07-24: 25 ug via INTRAVENOUS
  Filled 2022-07-24: qty 1

## 2022-07-24 MED ORDER — INSULIN ASPART 100 UNIT/ML IJ SOLN
0.0000 [IU] | Freq: Three times a day (TID) | INTRAMUSCULAR | Status: DC
  Administered 2022-07-24 (×2): 1 [IU] via SUBCUTANEOUS
  Administered 2022-07-24: 5 [IU] via SUBCUTANEOUS
  Administered 2022-07-25: 2 [IU] via SUBCUTANEOUS
  Administered 2022-07-25: 5 [IU] via SUBCUTANEOUS
  Administered 2022-07-25: 2 [IU] via SUBCUTANEOUS
  Administered 2022-07-26: 1 [IU] via SUBCUTANEOUS
  Administered 2022-07-26: 2 [IU] via SUBCUTANEOUS
  Administered 2022-07-27: 5 [IU] via SUBCUTANEOUS
  Administered 2022-07-27: 2 [IU] via SUBCUTANEOUS

## 2022-07-24 MED ORDER — CHLORHEXIDINE GLUCONATE CLOTH 2 % EX PADS
6.0000 | MEDICATED_PAD | Freq: Every day | CUTANEOUS | Status: DC
  Administered 2022-07-25 – 2022-07-27 (×3): 6 via TOPICAL

## 2022-07-24 MED ORDER — ONDANSETRON HCL 4 MG/2ML IJ SOLN
4.0000 mg | Freq: Once | INTRAMUSCULAR | Status: AC
  Administered 2022-07-24: 4 mg via INTRAVENOUS
  Filled 2022-07-24: qty 2

## 2022-07-24 MED ORDER — SODIUM CHLORIDE 0.9 % IV SOLN: INTRAVENOUS | Status: DC

## 2022-07-24 MED ORDER — ORAL CARE MOUTH RINSE: 15.0000 mL | OROMUCOSAL | Status: DC | PRN

## 2022-07-24 MED ORDER — CARVEDILOL 25 MG PO TABS
25.0000 mg | ORAL_TABLET | Freq: Two times a day (BID) | ORAL | Status: DC
  Administered 2022-07-24 – 2022-07-28 (×8): 25 mg via ORAL
  Filled 2022-07-24 (×4): qty 1
  Filled 2022-07-24: qty 2
  Filled 2022-07-24: qty 1
  Filled 2022-07-24: qty 2
  Filled 2022-07-24: qty 1

## 2022-07-24 MED ORDER — INSULIN GLARGINE-YFGN 100 UNIT/ML ~~LOC~~ SOLN
8.0000 [IU] | Freq: Every day | SUBCUTANEOUS | Status: DC
  Administered 2022-07-24: 8 [IU] via SUBCUTANEOUS
  Filled 2022-07-24 (×2): qty 0.08

## 2022-07-24 MED ORDER — HYDRALAZINE HCL 25 MG PO TABS
25.0000 mg | ORAL_TABLET | Freq: Four times a day (QID) | ORAL | Status: DC | PRN
  Administered 2022-07-24 – 2022-07-25 (×3): 25 mg via ORAL
  Filled 2022-07-24 (×3): qty 1

## 2022-07-24 MED ORDER — ENOXAPARIN SODIUM 30 MG/0.3ML IJ SOSY
30.0000 mg | PREFILLED_SYRINGE | INTRAMUSCULAR | Status: DC
  Administered 2022-07-24 – 2022-07-28 (×5): 30 mg via SUBCUTANEOUS
  Filled 2022-07-24 (×5): qty 0.3

## 2022-07-24 NOTE — Assessment & Plan Note (Signed)
As evidenced by moderate loss of subcutaneous muscle mass and fat diffusely

## 2022-07-24 NOTE — Hospital Course (Addendum)
Tricia Huynh is a 66 y.o. F with HTN, DM, CKD IIIb who presneted with few days headache, now blurred vision, nausea and vomiting.  In the ER, CTH normal and EKG without ischemic changes.  BP 240/120 and noted to have mild DKA.    9/6: Admitted to ICU on IV labetalol, insulin drip 9/7: Transitioned off insulin drip 9/9: BP better, Cr up 9/10: Nephrology consulted

## 2022-07-24 NOTE — Assessment & Plan Note (Signed)
Resolved

## 2022-07-24 NOTE — Assessment & Plan Note (Addendum)
Admitted with BP 240/120 and evidence of end organ damage (AKI).  Blood pressure lowered 25% in first 24 hours, since then has been difficult to lower further - Continue amlodipine, new carvedilol and spironolactone - Hold home ARB for now - Continue as needed labetalol and hydralazine for blood pressure greater than 175

## 2022-07-24 NOTE — Progress Notes (Signed)
       CROSS COVER NOTE  NAME: [name] MRN: [id number] DOB : 04/30/1956    Date of Service   07/24/2022   HPI/Events of Note   Medication request received for nausea and ongoing headache unrelieved by tylenol. BP currently 178/99.  Interventions   Plan: Give PRN antihypertensives Fentanyl Zofran     This document was prepared using Dragon voice recognition software and may include unintentional dictation errors.  Neomia Glass DNP, MHA, FNP-BC Nurse Practitioner [facility] Pager [phone]

## 2022-07-24 NOTE — Inpatient Diabetes Management (Signed)
Inpatient Diabetes Program Recommendations  AACE/ADA: New Consensus Statement on Inpatient Glycemic Control (2015)  Target Ranges:  Prepandial:   less than 140 mg/dL      Peak postprandial:   less than 180 mg/dL (1-2 hours)      Critically ill patients:  140 - 180 mg/dL   Lab Results  Component Value Date   GLUCAP 148 (H) 07/24/2022   HGBA1C 10.3 (H) 04/27/2022    Review of Glycemic Control  Diabetes history: DM2 Outpatient Diabetes medications: Lantus 10 QD Current orders for Inpatient glycemic control: Semglee 8 QD, Novolog 0-9 units TID with meals and 0-5 HS  HgbA1C - 10.3%  Spoke with pt briefly this morning and pt was nauseated and spitting up. Told her I would talk with her later about her glycemic control at home. Discussed her diabetes control at last hospitalization on 04/28/2022. Pt was supposed to f/u with Endo in July 2023.  Inpatient Diabetes Program Recommendations:    Add Novolog meal coverage if eating > 50% - Novolog 2 units TID with meals.  See pt in am regarding monitoring blood sugars and f/u with Endo.  Thank you. Lorenda Peck, RD, LDN, [place] Inpatient Diabetes Coordinator [phone]

## 2022-07-24 NOTE — Assessment & Plan Note (Addendum)
Presented with mild acidosis, hyperglycemia.  Gap closed overnight first night.  Switch to subcutaneous insulin.  Glucose somewhat elevated in the last 24 hours - Continue glargine, increase dose - Continue SS corrections, add mealtime - Continue aspirin and atorvastatin, hold ARB

## 2022-07-24 NOTE — Assessment & Plan Note (Addendum)
Baseline Cr 1.7-1.9, here was 2.4 on admission, trended up to 2.7.  Renal US normal, urinalysis with LE on dip, but acellular, no casts, marked proteinuria.  FeNa 2.4%.    Down to 2.4 mg/dL today.   - Stop fluids and push oral hydration - Stop spironolactone and ARB - Trend BMP - Consult Nephrology

## 2022-07-24 NOTE — Assessment & Plan Note (Signed)
No evidence of fluid overload - Continue amlodipine - Hold ARB

## 2022-07-24 NOTE — Progress Notes (Signed)
  Progress Note   Patient: Tricia Huynh NOI:[id number] DOB: March 09, 1956 DOA: 07/23/2022     1 DOS: the patient was seen and examined on 07/24/2022 at 9:01 AM      Brief hospital course: Mrs. Brenton is a 66 y.o. F with HTN, DM, CKD IIIb who presneted with few days headache, now blurred vision, nausea and vomiting.  In the ER, CTH normal and EKG without ischemic changes.  BP 240/120 and noted to have mild DKA.    9/6: Admitted to ICU on IV labetalol, insulin drip 9/7: Transitioned off insulin drip     Assessment and Plan: * Hypertensive crisis Admitted with BP 240/120 and evidence of end organ damage (AKI).  BP lowered 25% overnight. - Continue home amlodipine - Hold home ARB for now - Continue PRN labetalol to keep BP <694 systolic  Acute renal failure superimposed on stage 3b chronic kidney disease (HCC) Baseline Cr 1.7-1.9, here was 2.4 on admission, improved to 2.2 overnight. - Continue IV fluids at 75 cc/hr - Hold ARB  Hypokalemia - Supplement potassium  Malnutrition of moderate degree As evidenced by moderate loss of subcutaneous muscle mass and fat diffusely  Chronic diastolic CHF (congestive heart failure) (HCC) No evidence of fluid overload - Continue amlodipine - Hold ARB  Diabetic ketoacidosis without coma associated with type 2 diabetes mellitus ([place]) Presented with mild acidosis, hyperglycemia.  Gap closed overnight, glucoses normal. - Stop drip - Resume home subq insulin glargine - Start SS corrections - Continue aspirin and atorvastatin -Hold ARB          Subjective: Patient has a headache.  She has no chest pain, dyspnea, swelling, confusion.  No focal weakness or numbness.     Physical Exam: BP (!) 186/104   Pulse 69   Temp 98.7 F (37.1 C) (Oral)   Resp 11   Ht '5\' 8"'$  (1.727 m)   Wt 47.9 kg   SpO2 100%   BMI 16.06 kg/m   Thin adult female, lying in bed, appears uncomfortable, tired. RRR, no murmurs, no peripheral edema,  JVP normal Respiratory rate normal, lungs clear without rales or wheezes Abdomen soft no tenderness palpation or guarding Attention diminished, affect blunted, judgment and insight appear normal, oriented to person, place, time, and situation, just very tired Cranial nerves III through XII intact, face symmetric, speech fluent, strength 5/5 in upper and lower extremities bilaterally, no sensory deficits to light touch    Data Reviewed: Glucose has normalized Creatinine down to 2.2 from baseline 1.9 Urinalysis with proteinuria, acellular Hemoglobin 9.2, no change from baseline Hemoglobin A1c 10% 2 months ago Potassium down to 3.3 Sodium normalized CT head unremarkable Mildly elevated alkaline phosphatase     Disposition: Status is: Inpatient         Author: Edwin Dada, MD 07/24/2022 9:40 AM  For on call review [email or web address].

## 2022-07-25 DIAGNOSIS — E871 Hypo-osmolality and hyponatremia: Secondary | ICD-10-CM

## 2022-07-25 DIAGNOSIS — I169 Hypertensive crisis, unspecified: Secondary | ICD-10-CM | POA: Diagnosis not present

## 2022-07-25 DIAGNOSIS — E1110 Type 2 diabetes mellitus with ketoacidosis without coma: Secondary | ICD-10-CM | POA: Diagnosis not present

## 2022-07-25 DIAGNOSIS — I5032 Chronic diastolic (congestive) heart failure: Secondary | ICD-10-CM | POA: Diagnosis not present

## 2022-07-25 DIAGNOSIS — N179 Acute kidney failure, unspecified: Secondary | ICD-10-CM | POA: Diagnosis not present

## 2022-07-25 LAB — PROTIME-INR
INR: 1.2 (ref 0.8–1.2)
Prothrombin Time: 14.7 seconds (ref 11.4–15.2)

## 2022-07-25 LAB — COMPREHENSIVE METABOLIC PANEL
ALT: 8 U/L (ref 0–44)
AST: 10 U/L — ABNORMAL LOW (ref 15–41)
Albumin: 2.3 g/dL — ABNORMAL LOW (ref 3.5–5.0)
Alkaline Phosphatase: 104 U/L (ref 38–126)
Anion gap: 3 — ABNORMAL LOW (ref 5–15)
BUN: 24 mg/dL — ABNORMAL HIGH (ref 8–23)
CO2: 21 mmol/L — ABNORMAL LOW (ref 22–32)
Calcium: 8.4 mg/dL — ABNORMAL LOW (ref 8.9–10.3)
Chloride: 110 mmol/L (ref 98–111)
Creatinine, Ser: 2.4 mg/dL — ABNORMAL HIGH (ref 0.44–1.00)
GFR, Estimated: 22 mL/min — ABNORMAL LOW (ref >60)
Glucose, Bld: 262 mg/dL — ABNORMAL HIGH (ref 70–99)
Potassium: 3.7 mmol/L (ref 3.5–5.1)
Sodium: 134 mmol/L — ABNORMAL LOW (ref 135–145)
Total Bilirubin: 0.4 mg/dL (ref 0.3–1.2)
Total Protein: 5.9 g/dL — ABNORMAL LOW (ref 6.5–8.1)

## 2022-07-25 LAB — GLUCOSE, CAPILLARY
Glucose-Capillary: 264 mg/dL — ABNORMAL HIGH (ref 70–99)
Glucose-Capillary: 193 mg/dL — ABNORMAL HIGH (ref 70–99)
Glucose-Capillary: 185 mg/dL — ABNORMAL HIGH (ref 70–99)
Glucose-Capillary: 179 mg/dL — ABNORMAL HIGH (ref 70–99)

## 2022-07-25 LAB — CBC
HCT: 24.3 % — ABNORMAL LOW (ref 36.0–46.0)
Hemoglobin: 8.4 g/dL — ABNORMAL LOW (ref 12.0–15.0)
MCH: 30.5 pg (ref 26.0–34.0)
MCHC: 34.6 g/dL (ref 30.0–36.0)
MCV: 88.4 fL (ref 80.0–100.0)
Platelets: 247 10*3/uL (ref 150–400)
RBC: 2.75 MIL/uL — ABNORMAL LOW (ref 3.87–5.11)
RDW: 12.7 % (ref 11.5–15.5)
WBC: 4.9 10*3/uL (ref 4.0–10.5)
nRBC: 0 % (ref 0.0–0.2)

## 2022-07-25 MED ORDER — INSULIN ASPART 100 UNIT/ML IJ SOLN
3.0000 [IU] | Freq: Three times a day (TID) | INTRAMUSCULAR | Status: DC
  Administered 2022-07-25 – 2022-07-26 (×5): 3 [IU] via SUBCUTANEOUS

## 2022-07-25 MED ORDER — HYDRALAZINE HCL 50 MG PO TABS: 50.0000 mg | ORAL_TABLET | Freq: Four times a day (QID) | ORAL | Status: DC | PRN

## 2022-07-25 MED ORDER — SODIUM CHLORIDE 0.9 % IV SOLN: INTRAVENOUS | Status: DC | PRN

## 2022-07-25 MED ORDER — PROMETHAZINE HCL 25 MG/ML IJ SOLN
12.5000 mg | Freq: Once | INTRAMUSCULAR | Status: AC
Start: 2022-07-25 — End: 2022-07-25
  Administered 2022-07-25: 12.5 mg via INTRAVENOUS
  Filled 2022-07-25: qty 12.5
  Filled 2022-07-25: qty 0.5

## 2022-07-25 MED ORDER — INSULIN GLARGINE-YFGN 100 UNIT/ML ~~LOC~~ SOLN
12.0000 [IU] | Freq: Every day | SUBCUTANEOUS | Status: DC
  Administered 2022-07-25 – 2022-07-26 (×2): 12 [IU] via SUBCUTANEOUS
  Filled 2022-07-25 (×2): qty 0.12

## 2022-07-25 NOTE — Inpatient Diabetes Management (Signed)
Inpatient Diabetes Program Recommendations  AACE/ADA: New Consensus Statement on Inpatient Glycemic Control (2015)  Target Ranges:  Prepandial:   less than 140 mg/dL      Peak postprandial:   less than 180 mg/dL (1-2 hours)      Critically ill patients:  140 - 180 mg/dL   Lab Results  Component Value Date   GLUCAP 193 (H) 07/25/2022   HGBA1C 10.3 (H) 04/27/2022    Review of Glycemic Control  Diabetes history: DM2 Outpatient Diabetes medications: Lantus 10 QD Current orders for Inpatient glycemic control: Semglee12 units QD, Novolog 0-9 units TID with meals and 0-5 HS + 3 units TID  Inpatient Diabetes Program Recommendations:    Agree with orders.  Spoke with pt at bedside regarding her diabetes and HgbA1C of 10.3%. Pt states she checks her blood sugars at least 3x/day. Was a bit confused when asked how much insulin she takes - said 20 units BID, then said, no I think it's 10. When asked whether it was 10 twice a day or once a day, she said "I believe one time a day. Pt said she doesn't take her insulin if she has nausea, vomiting. Instructed to take insulin as prescribed since Lantus is a basal insulin. Pt to f/u with her PCP regarding other instructions.   Continue to follow.  Thank you. Lorenda Peck, RD, LDN, [place] Inpatient Diabetes Coordinator [phone]

## 2022-07-25 NOTE — Progress Notes (Signed)
  Progress Note   Patient: Tricia Huynh OIZ:[id number] DOB: 12-Oct-1956 DOA: 07/23/2022     2 DOS: the patient was seen and examined on 07/25/2022 a 9:30AM      Brief hospital course: Tricia Huynh is a 66 y.o. F with HTN, DM, CKD IIIb who presneted with few days headache, now blurred vision, nausea and vomiting.  In the ER, CTH normal and EKG without ischemic changes.  BP 240/120 and noted to have mild DKA.    9/6: Admitted to ICU on IV labetalol, insulin drip 9/7: Transitioned off insulin drip     Assessment and Plan: * Hypertensive crisis Admitted with BP 240/120 and evidence of end organ damage (AKI).  Blood pressure lowered 25% in first 24 hours, since then has been difficult to lower further - Continue amlodipine, new carvedilol and spironolactone - Hold home ARB for now - Continue as needed labetalol and hydralazine for blood pressure greater than 175     Acute renal failure superimposed on stage 3b chronic kidney disease (HCC) Baseline Cr 1.7-1.9, here was 2.4 on admission, improved to 2.2 overnight, but back up to 2.4 today - Push IV fluids - Blood pressure control - Hold ARB  Hyponatremia Mild, asymptomatic - Stop fluids  Hypokalemia - Supplement potassium  Malnutrition of moderate degree As evidenced by moderate loss of subcutaneous muscle mass and fat diffusely  Chronic diastolic CHF (congestive heart failure) (HCC) No evidence of fluid overload - Continue amlodipine - Hold ARB  Diabetic ketoacidosis without coma associated with type 2 diabetes mellitus ([place]) Presented with mild acidosis, hyperglycemia.  Gap closed overnight first night.  Switch to subcutaneous insulin.  Glucose somewhat elevated in the last 24 hours - Continue glargine, increase dose - Continue SS corrections, add mealtime - Continue aspirin and atorvastatin, hold ARB           Subjective: Patient still has generalized malaise, somewhat weak, headache better this  morning.  No fever, confusion, vomiting, focal weakness or numbness.     Physical Exam: BP (!) 165/96   Pulse 74   Temp 98.2 F (36.8 C) (Oral)   Resp 13   Ht '5\' 8"'$  (1.727 m)   Wt 47.9 kg   SpO2 100%   BMI 16.06 kg/m   Thin adult female, lying in bed, appears tired and weak RRR, no murmurs, no peripheral edema Respiratory rate normal, lungs clear without rales or wheezes Abdomen soft without tenderness palpation or guarding, no ascites or distention Attention normal, affect blunted, judgment and insight appear normal, cranial nerves III through XII intact, strength 5/5 in the upper and lower extremities bilaterally    Data Reviewed: Basic metabolic panel shows creatinine trending back up to 2.4, sodium down to 134 Hemoglobin 8.4, slightly down      Disposition: Status is: Inpatient         Author: Edwin Dada, MD 07/25/2022 10:18 AM  For on call review [email or web address].

## 2022-07-25 NOTE — Assessment & Plan Note (Signed)
Resolved

## 2022-07-25 NOTE — Evaluation (Signed)
Physical Therapy Evaluation Patient Details Name: Tricia Huynh MRN: [id number] DOB: [birth year] Today's Date: 07/25/2022  History of Present Illness  Tricia Huynh is a 66 y.o. F admitted 9/6 with few days headache, now blurred vision, nausea and vomiting. PMH:   HTN, DM, CKD IIIb  Clinical Impression  Pt admitted with above diagnosis. Pt was able to ambulate with min to min guard assist needing UE support at times for balance. Pt states "I feel weak but am close to how I was.".  Pt scored 18/24 on DGI suggesting at risk for falls without device therefore recommend rollator for home use and HHPT for endurance and balance training. Pt states she will have 24 hour care.   Pt currently with functional limitations due to the deficits listed below (see PT Problem List). Pt will benefit from skilled PT to increase their independence and safety with mobility to allow discharge to the venue listed below.          Recommendations for follow up therapy are one component of a multi-disciplinary discharge planning process, led by the attending physician.  Recommendations may be updated based on patient status, additional functional criteria and insurance authorization.  Follow Up Recommendations Home health PT      Assistance Recommended at Discharge Intermittent Supervision/Assistance  Patient can return home with the following  A little help with walking and/or transfers;A little help with bathing/dressing/bathroom;Help with stairs or ramp for entrance;Assistance with cooking/housework    Programmer, applications (4 wheels)  Recommendations for Other Services       Functional Status Assessment Patient has had a recent decline in their functional status and demonstrates the ability to make significant improvements in function in a reasonable and predictable amount of time.     Precautions / Restrictions Precautions Precautions: Fall Restrictions Weight Bearing Restrictions: No       Mobility  Bed Mobility Overal bed mobility: Independent                  Transfers Overall transfer level: Independent                      Ambulation/Gait Ambulation/Gait assistance: Supervision, Min guard Gait Distance (Feet): 180 Feet Assistive device: None, 1 person hand held assist Gait Pattern/deviations: Step-through pattern, Decreased stride length, Drifts right/left   Gait velocity interpretation: <1.8 ft/sec, indicate of risk for recurrent falls   General Gait Details: Pt was able to ambulate without device however at times reaches for furniture and therapist for support. Lists to right at times not ever losing balance and was able to self correct but was notable.  Stairs            Wheelchair Mobility    Modified Rankin (Stroke Patients Only)       Balance                                 Standardized Balance Assessment Standardized Balance Assessment : Dynamic Gait Index   Dynamic Gait Index Level Surface: Normal Change in Gait Speed: Normal Gait with Horizontal Head Turns: Mild Impairment Gait with Vertical Head Turns: Mild Impairment Gait and Pivot Turn: Moderate Impairment Step Over Obstacle: Mild Impairment Step Around Obstacles: Normal Steps: Mild Impairment Total Score: 18       Pertinent Vitals/Pain Pain Assessment Pain Assessment: No/denies pain    Home Living Family/patient expects to be discharged to:: Private residence Living  Arrangements: Other relatives (grandchildren) Available Help at Discharge: Family;Available 24 hours/day Type of Home: House Home Access: Stairs to enter Entrance Stairs-Rails: Right;Left;Can reach both Entrance Stairs-Number of Steps: 5   Home Layout: One level Home Equipment: None      Prior Function               Mobility Comments: independent-sometimes holds on to walls/furniture ADLs Comments: patient reported being independent in toileting tasks,  granddaughter helps with showers as needed.     Hand Dominance   Dominant Hand: Right    Extremity/Trunk Assessment   Upper Extremity Assessment Upper Extremity Assessment: Defer to OT evaluation    Lower Extremity Assessment Lower Extremity Assessment: Generalized weakness    Cervical / Trunk Assessment Cervical / Trunk Assessment: Normal  Communication   Communication: No difficulties  Cognition Arousal/Alertness: Awake/alert Behavior During Therapy: WFL for tasks assessed/performed Overall Cognitive Status: Within Functional Limits for tasks assessed                                          General Comments General comments (skin integrity, edema, etc.): 100% RA, 74 bpm, 153/85 pre BP,  171/85 post BP.    Exercises     Assessment/Plan    PT Assessment Patient needs continued PT services  PT Problem List Decreased activity tolerance;Decreased balance;Decreased mobility;Decreased knowledge of use of DME;Decreased safety awareness;Decreased knowledge of precautions       PT Treatment Interventions DME instruction;Gait training;Functional mobility training;Stair training;Therapeutic activities;Therapeutic exercise;Balance training;Patient/family education    PT Goals (Current goals can be found in the Care Plan section)  Acute Rehab PT Goals Patient Stated Goal: to go home PT Goal Formulation: With patient Time For Goal Achievement: 08/08/22 Potential to Achieve Goals: Good    Frequency Min 3X/week     Co-evaluation               AM-PAC PT "6 Clicks" Mobility  Outcome Measure Help needed turning from your back to your side while in a flat bed without using bedrails?: None Help needed moving from lying on your back to sitting on the side of a flat bed without using bedrails?: None Help needed moving to and from a bed to a chair (including a wheelchair)?: A Little Help needed standing up from a chair using your arms (e.g., wheelchair or  bedside chair)?: A Little Help needed to walk in hospital room?: A Little Help needed climbing 3-5 steps with a railing? : A Lot 6 Click Score: 19    End of Session Equipment Utilized During Treatment: Gait belt Activity Tolerance: Patient tolerated treatment well Patient left: in chair;with call bell/phone within reach;with chair alarm set Nurse Communication: Mobility status PT Visit Diagnosis: Unsteadiness on feet (R26.81);Muscle weakness (generalized) (M62.81)    Time: 3825-0539 PT Time Calculation (min) (ACUTE ONLY): 20 min   Charges:   PT Evaluation $PT Eval Moderate Complexity: 1 Mod          Ramesha Poster M,PT Acute Rehab Services [phone]   Alvira Philips 07/25/2022, 4:07 PM

## 2022-07-26 ENCOUNTER — Inpatient Hospital Stay (HOSPITAL_COMMUNITY): Payer: Medicare Other

## 2022-07-26 DIAGNOSIS — N179 Acute kidney failure, unspecified: Secondary | ICD-10-CM | POA: Diagnosis not present

## 2022-07-26 DIAGNOSIS — I5032 Chronic diastolic (congestive) heart failure: Secondary | ICD-10-CM | POA: Diagnosis not present

## 2022-07-26 DIAGNOSIS — I169 Hypertensive crisis, unspecified: Secondary | ICD-10-CM | POA: Diagnosis not present

## 2022-07-26 DIAGNOSIS — E1110 Type 2 diabetes mellitus with ketoacidosis without coma: Secondary | ICD-10-CM | POA: Diagnosis not present

## 2022-07-26 LAB — CBC
HCT: 30 % — ABNORMAL LOW (ref 36.0–46.0)
Hemoglobin: 9.8 g/dL — ABNORMAL LOW (ref 12.0–15.0)
MCH: 29.6 pg (ref 26.0–34.0)
MCHC: 32.7 g/dL (ref 30.0–36.0)
MCV: 90.6 fL (ref 80.0–100.0)
Platelets: 277 10*3/uL (ref 150–400)
RBC: 3.31 MIL/uL — ABNORMAL LOW (ref 3.87–5.11)
RDW: 12.8 % (ref 11.5–15.5)
WBC: 4.9 10*3/uL (ref 4.0–10.5)
nRBC: 0 % (ref 0.0–0.2)

## 2022-07-26 LAB — URINALYSIS, ROUTINE W REFLEX MICROSCOPIC
Bilirubin Urine: NEGATIVE
Glucose, UA: 150 mg/dL — AB
Hgb urine dipstick: NEGATIVE
Ketones, ur: NEGATIVE mg/dL
Nitrite: NEGATIVE
Protein, ur: >=300 mg/dL — AB
Specific Gravity, Urine: 1.013 (ref 1.005–1.030)
pH: 5 (ref 5.0–8.0)

## 2022-07-26 LAB — BASIC METABOLIC PANEL
Anion gap: 4 — ABNORMAL LOW (ref 5–15)
BUN: 28 mg/dL — ABNORMAL HIGH (ref 8–23)
CO2: 24 mmol/L (ref 22–32)
Calcium: 9.1 mg/dL (ref 8.9–10.3)
Chloride: 110 mmol/L (ref 98–111)
Creatinine, Ser: 2.77 mg/dL — ABNORMAL HIGH (ref 0.44–1.00)
GFR, Estimated: 18 mL/min — ABNORMAL LOW (ref >60)
Glucose, Bld: 166 mg/dL — ABNORMAL HIGH (ref 70–99)
Potassium: 3.6 mmol/L (ref 3.5–5.1)
Sodium: 138 mmol/L (ref 135–145)

## 2022-07-26 LAB — GLUCOSE, CAPILLARY
Glucose-Capillary: 156 mg/dL — ABNORMAL HIGH (ref 70–99)
Glucose-Capillary: 129 mg/dL — ABNORMAL HIGH (ref 70–99)
Glucose-Capillary: 67 mg/dL — ABNORMAL LOW (ref 70–99)
Glucose-Capillary: 92 mg/dL (ref 70–99)
Glucose-Capillary: 267 mg/dL — ABNORMAL HIGH (ref 70–99)

## 2022-07-26 LAB — SODIUM, URINE, RANDOM: Sodium, Ur: 99 mmol/L

## 2022-07-26 LAB — CREATININE, URINE, RANDOM: Creatinine, Urine: 80 mg/dL

## 2022-07-26 MED ORDER — SODIUM CHLORIDE 0.9 % IV SOLN: INTRAVENOUS | Status: AC

## 2022-07-26 MED ORDER — INSULIN GLARGINE-YFGN 100 UNIT/ML ~~LOC~~ SOLN
8.0000 [IU] | Freq: Every day | SUBCUTANEOUS | Status: DC
  Administered 2022-07-27: 8 [IU] via SUBCUTANEOUS
  Filled 2022-07-26: qty 0.08

## 2022-07-26 NOTE — Progress Notes (Signed)
  Progress Note   Patient: Tricia Huynh FOY:[id number] DOB: 1956/05/19 DOA: 07/23/2022     3 DOS: the patient was seen and examined on 07/26/2022 at 10:05AM      Brief hospital course: Tricia Huynh is a 66 y.o. F with HTN, DM, CKD IIIb who presneted with few days headache, now blurred vision, nausea and vomiting.  In the ER, CTH normal and EKG without ischemic changes.  BP 240/120 and noted to have mild DKA.    9/6: Admitted to ICU on IV labetalol, insulin drip 9/7: Transitioned off insulin drip 9/9: BP better, Cr slightly up     Assessment and Plan: * Hypertensive crisis Admitted with BP 240/120 and evidence of end organ damage (AKI).  Blood pressure lowered 25% in first 24 hours Amlodipine continued.  ARB held given renal dysfunction New carvedilol and spironolactone started BP now better controlled - Continue amlodipine, new carvedilol and spironolactone - Continue as needed labetalol and hydralazine for blood pressure greater than 175     Acute renal failure superimposed on stage 3b chronic kidney disease (HCC) Baseline Cr 1.7-1.9, here was 2.4 on admission, now trending up to 2.7.  Renal US normal - Check UA and urine lytes - Resume IV fluids - Trend BMP - Hold ARB   Hyponatremia Resolved.  Hypokalemia Resolved  Malnutrition of moderate degree As evidenced by moderate loss of subcutaneous muscle mass and fat diffusely  Chronic diastolic CHF (congestive heart failure) (HCC) No evidence of fluid overload - Continue amlodipine and spironolactone - Hold ARB  Diabetic ketoacidosis without coma associated with type 2 diabetes mellitus ([place]) Presented with mild acidosis, hyperglycemia.  Gap closed overnight first night.  Switch to subcutaneous insulin.  Glucose better controled now - Continue glargine - Consult Diabetes ed - Continue SS corrections - Continue insulin at mealtime - Continue aspirin and atorvastatin, hold ARB            Subjective: Feels beter.  Nausea resolved.  No headache.  No confusion, CP, dyspnea.  No dysuria.       Physical Exam: BP (!) 144/92 (BP Location: Left Arm)   Pulse 75   Temp 98.3 F (36.8 C) (Oral)   Resp 18   Ht '5\' 8"'$  (1.727 m)   Wt 47.9 kg   SpO2 100%   BMI 16.06 kg/m   Thin adult female, ambulating in the room independnetly, no acute distress RRR no murmurs no LE edema, JVP normal Respiratory rate normal, lungs clear without rales or wheezing Abdomen soft without tenderness ot palpation, no distension Attention normal, affect pleasant, judgment and insitght appears slightly impaired, but at baseline        Data Reviewed: US abdomen normal Hgb up to 9s, WBC normal Creatinine up to 2.7, Na and K normalized        Disposition: Status is: Inpatient         Author: Edwin Dada, MD 07/26/2022 10:12 AM  For on call review [email or web address].

## 2022-07-26 NOTE — Progress Notes (Signed)
Hypoglycemic Event  CBG: 67  Treatment: 4 oz juice/soda  Symptoms: Shaky  Follow-up CBG: [id number] CBG Result:92  Possible Reasons for Event: Inadequate meal intake  Comments/MD notified:yes    Erin Sons Cox

## 2022-07-27 DIAGNOSIS — I169 Hypertensive crisis, unspecified: Secondary | ICD-10-CM | POA: Diagnosis not present

## 2022-07-27 DIAGNOSIS — N184 Chronic kidney disease, stage 4 (severe): Secondary | ICD-10-CM

## 2022-07-27 DIAGNOSIS — I5032 Chronic diastolic (congestive) heart failure: Secondary | ICD-10-CM | POA: Diagnosis not present

## 2022-07-27 DIAGNOSIS — N179 Acute kidney failure, unspecified: Secondary | ICD-10-CM | POA: Diagnosis not present

## 2022-07-27 DIAGNOSIS — D631 Anemia in chronic kidney disease: Secondary | ICD-10-CM

## 2022-07-27 LAB — BASIC METABOLIC PANEL
Anion gap: 5 (ref 5–15)
BUN: 31 mg/dL — ABNORMAL HIGH (ref 8–23)
CO2: 21 mmol/L — ABNORMAL LOW (ref 22–32)
Calcium: 8.4 mg/dL — ABNORMAL LOW (ref 8.9–10.3)
Chloride: 112 mmol/L — ABNORMAL HIGH (ref 98–111)
Creatinine, Ser: 2.44 mg/dL — ABNORMAL HIGH (ref 0.44–1.00)
GFR, Estimated: 21 mL/min — ABNORMAL LOW (ref >60)
Glucose, Bld: 175 mg/dL — ABNORMAL HIGH (ref 70–99)
Potassium: 3.8 mmol/L (ref 3.5–5.1)
Sodium: 138 mmol/L (ref 135–145)

## 2022-07-27 LAB — GLUCOSE, CAPILLARY
Glucose-Capillary: 159 mg/dL — ABNORMAL HIGH (ref 70–99)
Glucose-Capillary: 272 mg/dL — ABNORMAL HIGH (ref 70–99)
Glucose-Capillary: 65 mg/dL — ABNORMAL LOW (ref 70–99)
Glucose-Capillary: 96 mg/dL (ref 70–99)
Glucose-Capillary: 336 mg/dL — ABNORMAL HIGH (ref 70–99)

## 2022-07-27 LAB — CBC
HCT: 24.6 % — ABNORMAL LOW (ref 36.0–46.0)
Hemoglobin: 8.2 g/dL — ABNORMAL LOW (ref 12.0–15.0)
MCH: 30.1 pg (ref 26.0–34.0)
MCHC: 33.3 g/dL (ref 30.0–36.0)
MCV: 90.4 fL (ref 80.0–100.0)
Platelets: 222 10*3/uL (ref 150–400)
RBC: 2.72 MIL/uL — ABNORMAL LOW (ref 3.87–5.11)
RDW: 12.6 % (ref 11.5–15.5)
WBC: 5.1 10*3/uL (ref 4.0–10.5)
nRBC: 0 % (ref 0.0–0.2)

## 2022-07-27 MED ORDER — INSULIN ASPART 100 UNIT/ML IJ SOLN
0.0000 [IU] | Freq: Three times a day (TID) | INTRAMUSCULAR | Status: DC
  Administered 2022-07-27: 4 [IU] via SUBCUTANEOUS
  Administered 2022-07-28: 2 [IU] via SUBCUTANEOUS

## 2022-07-27 MED ORDER — HYDRALAZINE HCL 25 MG PO TABS
25.0000 mg | ORAL_TABLET | Freq: Three times a day (TID) | ORAL | Status: DC
  Administered 2022-07-27 – 2022-07-28 (×3): 25 mg via ORAL
  Filled 2022-07-27 (×3): qty 1

## 2022-07-27 NOTE — Care Plan (Signed)
Hypoglycemic again in setting of worsening renal function.   - Reduce correction insulin scale - Hold Semglee

## 2022-07-27 NOTE — Assessment & Plan Note (Addendum)
Hemoglobin between 8 and 9 during her hospital stay here.  Iron stores good in June.  - Defer ESA to nephrology

## 2022-07-27 NOTE — Consult Note (Signed)
Renal Service Consult Note [facility]  Will Schier 07/27/2022 Sol Blazing, MD Requesting Physician: Dr. Loleta Books  Reason for Consult: Renal failure HPI: The patient is a 66 y.o. year-old w/ hx of HTN, DM2 x 17 yrs, HL, hx seizures who presented to ED on 9/06 w/ gen'd weakness, headaches, N/V and blurred vision. BS 524 and BP 240/133. EKG neg and CTH negative. Creat 2.4. Pt was given 1 liter LR, po and IV K+, insulin and 62m IV hydralazine. Pt was admitted. Since admit her ARB was held d/t rising creatinine. Coreg and hydralazine were added to baseline norvasc w/ BP down now to 158/ 94 range.  Creat has not improved much here, remains in 2.4- 2.7 range. Asked to see for renal faliure.   Pt seen in room. Pt has PCP, sees about once per year. Has been diabetic for about 17 yrs. Had not run out of her BP meds. She is feeling a lot better, vision and headaches are gone.  No voiding issues, no hx stones. Denies hx of kidney problems.   ROS - denies CP, no joint pain, no HA, no blurry vision, no rash, no diarrhea, no nausea/ vomiting, no dysuria, no difficulty voiding   Past Medical History  Past Medical History:  Diagnosis Date   Diabetes mellitus type 2 in nonobese ([place] 06/06/2015   Diabetes mellitus without complication ([facility]    Diabetic neuropathy ([facility] 06/06/2015   Dyslipidemia 06/06/2015   Encephalopathy    Essential hypertension 06/06/2015   Hypertension    Mood disorder ([place] 06/06/2015   Pneumonia 03/11/2017   Seizure ([place]    sees YKrista Blue abd eeg, on keppra   Past Surgical History  Past Surgical History:  Procedure Laterality Date   ABDOMINAL HYSTERECTOMY     EYE SURGERY Bilateral    TUBAL LIGATION     Family History  Family History  Problem Relation Age of Onset   Heart disease Father    Kidney disease Father    Diabetes Mother    Seizures Sister    Seizures Brother    Social History  reports that she has quit smoking. Her smoking use  included cigarettes. She has never used smokeless tobacco. She reports that she does not drink alcohol and does not use drugs. Allergies  Allergies  Allergen Reactions   Percocet [Oxycodone-Acetaminophen] Nausea And Vomiting and Other (See Comments)    Causes the patient to be shaky/unsteady, also   Vicodin [Hydrocodone-Acetaminophen] Nausea And Vomiting and Other (See Comments)    Causes the patient to be shaky/unsteady, also   Home medications Prior to Admission medications   Medication Sig Start Date End Date Taking? Authorizing Provider  amitriptyline (ELAVIL) 50 MG tablet Take 50 mg by mouth daily. 05/08/15  Yes [provider]  amLODipine-olmesartan (AZOR) 10-40 MG tablet Take 1 tablet by mouth daily.   Yes [provider]  atorvastatin (LIPITOR) 40 MG tablet Take 40 mg by mouth daily. 12/23/19  Yes [provider]  ferrous sulfate 325 (65 FE) MG tablet Take 1 tablet (325 mg total) by mouth daily with breakfast. 05/02/22  Yes Sheikh, Omair Latif, DO  gabapentin (NEURONTIN) 100 MG capsule Take 900 mg by mouth at bedtime. 12/23/19  Yes [provider]  insulin glargine (LANTUS SOLOSTAR) 100 UNIT/ML Solostar Pen Inject 10 Units into the skin daily. And pen needles 1/day 05/01/22  Yes Sheikh, Omair Latif, DO  Multiple Vitamin (MULTIVITAMIN WITH MINERALS) TABS tablet Take 1 tablet by mouth daily.  05/02/22  Yes Sheikh, Omair Latif, DO  senna-docusate (SENOKOT-S) 8.6-50 MG tablet Take 1 tablet by mouth at bedtime. 05/01/22  Yes Sheikh, Omair [place], [place]     Vitals:   07/26/22 1339 07/26/22 1948 07/27/22 0424 07/27/22 1439  BP: (!) 156/88 (!) 143/85 (!) 153/85 (!) 152/94  Pulse: 73 72 68 78  Resp: _0 Temp: 98.2 F (36.8 C) 98 F (36.7 C) 97.9 F (36.6 C) 98 F (36.7 C)  TempSrc: Oral Oral Oral Oral  SpO2: 100% 100% 100% 100%  Weight:      Height:       Exam Gen alert, no distress No rash, cyanosis or gangrene Sclera anicteric, throat clear   No jvd or bruits Chest clear bilat to bases, no rales/ wheezing RRR no MRG Abd soft ntnd no mass or ascites +bs GU defer MS no joint effusions or deformity Ext no LE or UE edema, no wounds or ulcers Neuro is alert, Ox 3 , nf       Home meds include - amitriptyline, amlodipine-olmesartan 10-40, atorvastatin, gabapentin, insulin glargine, MVI, prns/ vits/ supps     Date   Creat  UA   eGFR   2015-16  0.84- 1.18   2017- 18  0.89- 1.72 neg protein, 0-5 rbc   2019   0.80- 1.30 neg protein, 0-5 rbc   2020   1.10- 1.65 0-30 prot, 0-10 rbc   2021   1.08- 1.84 100 prot, 21-50 rbc    2022 jan-June 0.96- 1.30 >300 prot, >50 rbc   2022 jul -sept  1.07- 1.45 > 300 prot, 6-10 rbc   Nov 20, 2021  1.25  > 300 prot, 11-20 rbc    March 10, 2022 1.37     June 11-15, 2023 1.63- 1.97 >300 prot,11-20 rbc 28- 35 ml/min, 3b   07/23/22  2.42   9/7   2.19   9/8   2.40   9/9   2.77   9/10   2.44    Renal US (9/09) - R 8.4 cm, normal echo, no hydro. L 8.6 cm, normal echo, no hydro.     UA 9/9 - large LE, neg nit, prot > 300, bact rare, 21-50 rbc, 0-5 epi, wbc 21-50 wbc    UNa 99, UCr 80    BP's 246/ 133 in ED, down to 152/94 here    Current BP meds > norvasc 10, coreg 25 bid, hydralazine 25 tid   Assessment/ Plan: Renal failure - progressive renal failure over the last few yrs especially, w/ proteinuria and microhematuria. Creat in June 2023 was 1.6- 1.9, here creat is up to 2.2- 2.7. Renal failure could be due to uncont HTN or DM2 or GN vs combination. Pt unaware she had kidney problems. Will quantitate the proteinuria and send off serologies. Will hold asa in case bx needed. Will follow.  Uncont HTN - severe high BP's 240/ 133 upon admission - ARB put on hold, BP's much better now on norvasc and new coreg and hydralazine.  Vol - not vol overloaded , looks euvolemic.  DM2 - has been diabetic about 17 yrs, +hx neuropathy      Kelly Splinter  MD 07/27/2022, 4:55 PM Recent Labs  Lab 07/23/22 1724  07/24/22 0326 07/25/22 0325 07/26/22 0658 07/27/22 0550  HGB 9.2*  --  8.4* 9.8* 8.2*  ALBUMIN 3.1*  --  2.3*  --   --   CALCIUM 9.1   < > 8.4* 9.1 8.4*  CREATININE 2.42*   < > 2.40* 2.77* 2.44*  K 3.4*   < > 3.7 3.6 3.8   < > = values in this interval not displayed.

## 2022-07-27 NOTE — Progress Notes (Signed)
  Progress Note   Patient: Tricia Huynh [id number]:[id number] DOB: 01-25-1956 DOA: 07/23/2022     4 DOS: the patient was seen and examined on 07/27/2022 at 10:00AM      Brief hospital course: Mrs. Kaufman is a 66 y.o. F with HTN, DM, CKD IIIb who presneted with few days headache, now blurred vision, nausea and vomiting.  In the ER, CTH normal and EKG without ischemic changes.  BP 240/120 and noted to have mild DKA.    9/6: Admitted to ICU on IV labetalol, insulin drip 9/7: Transitioned off insulin drip 9/9: BP better, Cr up 9/10: Nephrology consulted     Assessment and Plan: * Hypertensive crisis Admitted with BP 240/120 and evidence of end organ damage (AKI).  Blood pressure lowered 25% in first 24 hours Amlodipine continued.  ARB held given renal dysfunction  New oral agents started and BP now adequately controlled. - Continue amlodipine - Continue new Coreg - Stop spironolactone given renal function - Start hydralazine TID - Continue as needed labetalol for blood pressure greater than 175     Acute renal failure superimposed on stage 3b chronic kidney disease (HCC) Baseline Cr 1.7-1.9, here was 2.4 on admission, trended up to 2.7.  Renal US normal, urinalysis with LE on dip, but acellular, no casts, marked proteinuria.  FeNa 2.4%.    Down to 2.4 mg/dL today.   - Stop fluids and push oral hydration - Stop spironolactone and ARB - Trend BMP - Consult Nephrology   Anemia of chronic kidney failure, stage 4 (severe) (HCC) Hemoglobin between 8 and 9 during her hospital stay here.  Iron stores good in June.  - Defer ESA to nephrology  Hyponatremia Resolved.  Hypokalemia Resolved  Malnutrition of moderate degree As evidenced by moderate loss of subcutaneous muscle mass and fat diffusely  Chronic diastolic CHF (congestive heart failure) (HCC) No evidence of fluid overload - Continue amlodipine   - Hold ARB, stop spiro  Diabetic ketoacidosis without coma  associated with type 2 diabetes mellitus ([place]) Presented with mild acidosis, hyperglycemia.  Gap closed overnight first night.  Switch to subcutaneous insulin.  Glucose labile, some hypoglycemia yesterday's, some elevated blood sugars - Continue glargine, reduced dose - Consult Diabetes educator - Continue SS corrections - Continue aspirin and atorvastatin, hold ARB           Subjective: Patient is feeling well, no headache, chest pain, dyspnea, confusion, fever.  She has some mild edema in her right arm today.     Physical Exam: BP (!) 152/94 (BP Location: Left Arm)   Pulse 78   Temp 98 F (36.7 C) (Oral)   Resp 20   Ht '5\' 8"'$  (1.727 m)   Wt 47.9 kg   SpO2 100%   BMI 16.06 kg/m   Thin adult female, sitting up in bed, interactive, watching television RRR, no murmurs, no peripheral edema Respiratory rate normal, lungs clear without rales or wheezes Abdomen soft without tenderness palpation or guarding, no ascites or distention Attention normal, affect appropriate, judgment insight appear normal, face symmetric, speech fluent  Data Reviewed: Discussed with nephrology Basic metabolic panel notable for creatinine 2.4, electrolytes normal Hemoglobin down to 8.2 Urine electrolytes show Fina 2.4%, intrinsic Glucose labile  Family Communication: Daughter by phone    Disposition: Status is: Inpatient         Author: Edwin Dada, MD 07/27/2022 3:13 PM  For on call review [email or web address].

## 2022-07-27 NOTE — Progress Notes (Signed)
Hypoglycemic Event  CBG: 65  Treatment: 4 oz juice/soda  Symptoms: Shaky  Follow-up CBG: Time:1608 CBG Result:96  Possible Reasons for Event: Unknown yes Comments/MD notified:yes    Tricia Huynh

## 2022-07-27 NOTE — Plan of Care (Signed)

## 2022-07-28 ENCOUNTER — Other Ambulatory Visit (HOSPITAL_COMMUNITY): Payer: Self-pay

## 2022-07-28 DIAGNOSIS — I169 Hypertensive crisis, unspecified: Secondary | ICD-10-CM | POA: Diagnosis not present

## 2022-07-28 LAB — RENAL FUNCTION PANEL
Albumin: 2.6 g/dL — ABNORMAL LOW (ref 3.5–5.0)
Anion gap: 5 (ref 5–15)
BUN: 31 mg/dL — ABNORMAL HIGH (ref 8–23)
CO2: 22 mmol/L (ref 22–32)
Calcium: 8.7 mg/dL — ABNORMAL LOW (ref 8.9–10.3)
Chloride: 112 mmol/L — ABNORMAL HIGH (ref 98–111)
Creatinine, Ser: 2.42 mg/dL — ABNORMAL HIGH (ref 0.44–1.00)
GFR, Estimated: 22 mL/min — ABNORMAL LOW (ref >60)
Glucose, Bld: 179 mg/dL — ABNORMAL HIGH (ref 70–99)
Phosphorus: 4 mg/dL (ref 2.5–4.6)
Potassium: 3.9 mmol/L (ref 3.5–5.1)
Sodium: 139 mmol/L (ref 135–145)

## 2022-07-28 LAB — CBC
HCT: 26.5 % — ABNORMAL LOW (ref 36.0–46.0)
Hemoglobin: 8.7 g/dL — ABNORMAL LOW (ref 12.0–15.0)
MCH: 29.6 pg (ref 26.0–34.0)
MCHC: 32.8 g/dL (ref 30.0–36.0)
MCV: 90.1 fL (ref 80.0–100.0)
Platelets: 247 10*3/uL (ref 150–400)
RBC: 2.94 MIL/uL — ABNORMAL LOW (ref 3.87–5.11)
RDW: 12.7 % (ref 11.5–15.5)
WBC: 5.6 10*3/uL (ref 4.0–10.5)
nRBC: 0 % (ref 0.0–0.2)

## 2022-07-28 LAB — PROTEIN / CREATININE RATIO, URINE
Creatinine, Urine: 30 mg/dL
Protein Creatinine Ratio: 6.47 mg/mg{Cre} — ABNORMAL HIGH (ref 0.00–0.15)
Total Protein, Urine: 194 mg/dL

## 2022-07-28 LAB — GLUCOSE, CAPILLARY: Glucose-Capillary: 212 mg/dL — ABNORMAL HIGH (ref 70–99)

## 2022-07-28 LAB — MAGNESIUM: Magnesium: 1.8 mg/dL (ref 1.7–2.4)

## 2022-07-28 MED ORDER — TECHLITE PEN NEEDLES 32G X 4 MM MISC
11 refills | Status: AC
  Filled 2022-07-28: qty 100, 90d supply, fill #0

## 2022-07-28 MED ORDER — AMLODIPINE BESYLATE 10 MG PO TABS
10.0000 mg | ORAL_TABLET | Freq: Every day | ORAL | 3 refills | Status: DC
  Filled 2022-07-28: qty 30, 30d supply, fill #0

## 2022-07-28 MED ORDER — CARVEDILOL 25 MG PO TABS
25.0000 mg | ORAL_TABLET | Freq: Two times a day (BID) | ORAL | 3 refills | Status: DC
  Filled 2022-07-28: qty 60, 30d supply, fill #0

## 2022-07-28 MED ORDER — HYDRALAZINE HCL 25 MG PO TABS
25.0000 mg | ORAL_TABLET | Freq: Three times a day (TID) | ORAL | 3 refills | Status: DC
  Filled 2022-07-28: qty 90, 30d supply, fill #0

## 2022-07-28 MED ORDER — LANTUS SOLOSTAR 100 UNIT/ML ~~LOC~~ SOPN
5.0000 [IU] | PEN_INJECTOR | Freq: Every day | SUBCUTANEOUS | 11 refills | Status: DC
  Filled 2022-07-28: qty 3, 28d supply, fill #0

## 2022-07-28 MED ORDER — GABAPENTIN 100 MG PO CAPS
100.0000 mg | ORAL_CAPSULE | Freq: Every day | ORAL | 3 refills | Status: AC
  Filled 2022-07-28: qty 30, 30d supply, fill #0

## 2022-07-28 NOTE — Plan of Care (Signed)

## 2022-07-28 NOTE — TOC Transition Note (Signed)
Transition of Care [facility]) - CM/SW Discharge Note   Patient Details  Name: Tricia Huynh MRN: [id number] Date of Birth: 1956-01-30  [facility] CM/SW Contact:  Vassie Moselle, LCSW Phone Number: 07/28/2022, 10:33 AM   Clinical Narrative:    Met with pt and confirmed plan for HHPT. Home health PT has been arranged through [facility]. Pt is also agreeable to having rollator ordered. Rollator has been ordered through [employer] and will be delivered to pt's room prior to her discharging.    Final next level of care: Home w Home Health Services Barriers to Discharge: No Barriers Identified   Patient Goals and CMS Choice Patient states their goals for this hospitalization and ongoing recovery are:: To return home   Choice offered to / list presented to : Patient  Discharge Placement                       Discharge Plan and Services In-house Referral: Clinical Social Work Discharge Planning Services: CM Consult Post Acute Care Choice: Durable Medical Equipment, Home Health          DME Arranged: Walker rolling with seat DME Agency: [employer] Date DME Agency Contacted: 07/28/22 Time DME Agency Contacted: [phone] Representative spoke with at DME Agency: [place]on Grills HH Arranged: PT [place]: [place] Date [place]: 07/27/22   Representative spoke with at [place]: [place] ([place]) Interventions     Readmission Risk Interventions    07/28/2022   10:31 AM  Readmission Risk Prevention Plan  Transportation Screening Complete  PCP or Specialist Appt within 3-5 Days Complete  HRI or [place] Complete  Social Work Consult for [place] Planning/Counseling Complete  Palliative Care Screening Not Applicable  Medication Review Press photographer) Complete

## 2022-07-28 NOTE — Discharge Summary (Signed)
Physician Discharge Summary   Patient: Tricia Huynh MRN: [id number] DOB: [birth year]  Admit date:     07/23/2022  Discharge date: 07/28/22  Discharge Physician: Edwin Dada   PCP: Jonathon Jordan, MD     Recommendations at discharge:  Follow up with PCP Dr. Stephanie Acre in 1 week Dr. Stephanie Acre: Please check BMP and CBC in 1 week Dr. Stephanie Acre: Check BP on new carvedilol and hydralazine Dr. Stephanie Acre: Check blood glucose log on reduced dose insulin given worsening kidney function Follow up with [facility] for advancing renal failure     Discharge Diagnoses: Principal Problem:   Hypertensive crisis Active Problems:   Acute renal failure superimposed on stage 3b chronic kidney disease ([place])   Diabetic ketoacidosis without coma associated with type 2 diabetes mellitus ([place])   Chronic diastolic CHF (congestive heart failure) (HCC)   Malnutrition of moderate degree   Hypokalemia   Hyponatremia   Anemia of chronic kidney failure, stage IIIb      Hospital Course: Tricia Huynh is a 66 y.o. F with HTN, DM, CKD IIIb who presneted with few days headache, now blurred vision, nausea and vomiting.  In the ER, CTH normal and EKG without ischemic changes.  BP 240/120 and noted to have mild DKA.       * Hypertensive crisis Admitted with BP 240/120 and evidence of end organ damage (AKI). Blood pressure lowered 25% in first 24 hours, then amlodipine restarted.  Started on new carvedilol and hydralazine, blood pressures improved to 140s to 160s on the day of discharge.   Acute renal failure superimposed on stage 3b chronic kidney disease   Baseline Cr 1.7-1.9.  Remained 2.4-2.7 throughout her hospital stay despite IV fluids, holding ARB, diuretics.  Renal ultrasound normal Urinalysis acellular Protein creatinine ratio 6.4 FeNa 2.4%  Nephrology consulted on hospital day 4, suspected that she had progression of chronic kidney disease.  They sent ANA, ANCA, complements,  etc. and will follow these up outpatient.   Chronic diastolic CHF (congestive heart failure) (HCC) No evidence of fluid overload    Diabetic ketoacidosis without coma associated with type 2 diabetes mellitus ([place]) Presented with mild acidosis, hyperglycemia.  Started on insulin and gap closed overnight first night and she was switched to subcutaneous insulin.  She had some hypoglycemia with her home dose of glargine, so this was reduced at discharge.               The [facility] Controlled Substances Registry was reviewed for this patient prior to discharge.  Consultants: Nephrology   Disposition: Home Diet recommendation:  Discharge Diet Orders (From admission, onward)     Start     Ordered   07/28/22 0000  Diet - low sodium heart healthy        07/28/22 1110             DISCHARGE MEDICATION: Allergies as of 07/28/2022       Reactions   Percocet [oxycodone-acetaminophen] Nausea And Vomiting, Other (See Comments)   Causes the patient to be shaky/unsteady, also   Vicodin [hydrocodone-acetaminophen] Nausea And Vomiting, Other (See Comments)   Causes the patient to be shaky/unsteady, also        Medication List     STOP taking these medications    amLODipine-olmesartan 10-40 MG tablet Commonly known as: AZOR       TAKE these medications    amitriptyline 50 MG tablet Commonly known as: ELAVIL Take 50 mg by mouth daily.   amLODipine  10 MG tablet Commonly known as: NORVASC Take 1 tablet (10 mg total) by mouth daily. Start taking on: July 29, 2022   atorvastatin 40 MG tablet Commonly known as: LIPITOR Take 40 mg by mouth daily.   carvedilol 25 MG tablet Commonly known as: COREG Take 1 tablet (25 mg total) by mouth 2 (two) times daily with a meal.   ferrous sulfate 325 (65 FE) MG tablet Take 1 tablet (325 mg total) by mouth daily with breakfast.   gabapentin 100 MG capsule Commonly known as: NEURONTIN Take 1 capsule (100 mg total)  by mouth at bedtime. What changed: how much to take   hydrALAZINE 25 MG tablet Commonly known as: APRESOLINE Take 1 tablet (25 mg total) by mouth every 8 (eight) hours.   Lantus SoloStar 100 UNIT/ML Solostar Pen Generic drug: insulin glargine Inject 5 Units into the skin daily. Discard pen after 28 days from first use What changed:  how much to take additional instructions   multivitamin with minerals Tabs tablet Take 1 tablet by mouth daily.   senna-docusate 8.6-50 MG tablet Commonly known as: Senokot-S Take 1 tablet by mouth at bedtime.        Follow-up Information     [facility] Follow up.   Specialty: Home Health Services Why: Alvis Lemmings will reach out to you to set up the first visit. Contact information: [place] [address] [phone]         [facility] Follow up in 2 week(s).   Why: We will  call with appointment details Contact information: [place] [address] [phone]         Jonathon Jordan, MD Follow up.   Specialty: Family Medicine Contact information: [address] [place] [address] [phone]                 Discharge Instructions     Diet - low sodium heart healthy   Complete by: As directed    Discharge instructions   Complete by: As directed    From Dr. Loleta Books: You were admitted for hypertensive emergency (high blood pressure emergency)  You also were found to have worse kidney function  For your blood sugar: STOP amlodipine olmesartan (the olmesartan ingredient is not safe) START amlodipine 10 mg daily  START carvedilol/Coreg 25 mg twice daily (morning and night) START hydralazine 25 mg three times daily (morning noon and night)  Go see Dr. Stephanie Acre in 1 week  Have her check your kidney function   ALSO: For your blood sugar, insulin lasts longer when your kidney function is worse: REDUCE your Lantus insulin to 5 units daily in  the morning Check your sugar three times daily and show Dr. Stephanie Acre Call her office if your sugar is high   ALSO: Gabapentin is not safe at high doses when your kidney function is worse Reduce to 100 mg only at night   Last: Call [facility] for an appointment (they may call you first)(See below in the To Do section)   Increase activity slowly   Complete by: As directed        Discharge Exam: Filed Weights   07/23/22 2249  Weight: 47.9 kg    General: Pt is alert, awake, not in acute distress Cardiovascular: RRR, nl S1-S2, no murmurs appreciated.   No LE edema.   Respiratory: Normal respiratory rate and rhythm.  CTAB without rales or wheezes. Abdominal: Abdomen soft and non-tender.  No distension or HSM.   Neuro/Psych: Strength symmetric in upper and lower extremities.  Judgment and insight appear normal.   Condition at discharge: good  The results of significant diagnostics from this hospitalization (including imaging, microbiology, ancillary and laboratory) are listed below for reference.   Imaging Studies: US Abdomen Complete  Result Date: 07/26/2022 CLINICAL DATA:  Nausea and vomiting EXAM: ABDOMEN ULTRASOUND COMPLETE COMPARISON:  Renal stone protocol CT 04/27/2022 FINDINGS: Gallbladder: No gallstones or wall thickening visualized. No sonographic Murphy sign noted by sonographer. Common bile duct: Diameter: 2 mm Liver: No focal lesion identified. Within normal limits in parenchymal echogenicity. Portal vein is patent on color Doppler imaging with normal direction of blood flow towards the liver. IVC: No abnormality visualized. Pancreas: Visualized portion unremarkable. Spleen: Size and appearance within normal limits. Right Kidney: Length: 8.4 cm. Echogenicity within normal limits. No mass or hydronephrosis visualized. 1.3 cm simple cyst of the right kidney. Left Kidney: Length: 8.6 cm. Echogenicity within normal limits. No mass or hydronephrosis visualized. Two simple  cysts are seen measuring 1.3 and 0.9 cm. Abdominal aorta: No aneurysm visualized. Other findings: None. IMPRESSION: No significant sonographic abnormality of the abdomen. Electronically Signed   By: Miachel Roux M.D.   On: 07/26/2022 08:00   CT HEAD WO CONTRAST (5MM)  Result Date: 07/23/2022 CLINICAL DATA:  Headache. EXAM: CT HEAD WITHOUT CONTRAST TECHNIQUE: Contiguous axial images were obtained from the base of the skull through the vertex without intravenous contrast. RADIATION DOSE REDUCTION: This exam was performed according to the departmental dose-optimization program which includes automated exposure control, adjustment of the mA and/or kV according to patient size and/or use of iterative reconstruction technique. COMPARISON:  Head CT dated 04/27/2022. FINDINGS: Brain: Mild age-related atrophy and chronic microvascular ischemic changes. There is no acute intracranial hemorrhage. No mass effect or midline shift. No extra-axial fluid collection. Vascular: No hyperdense vessel or unexpected calcification. Skull: Normal. Negative for fracture or focal lesion. Sinuses/Orbits: Mild diffuse mucoperiosteal thickening of paranasal sinuses. No air-fluid level. The mastoid air cells are clear. Other: None IMPRESSION: 1. No acute intracranial pathology. 2. Mild age-related atrophy and chronic microvascular ischemic changes. Electronically Signed   By: Anner Crete M.D.   On: 07/23/2022 19:26    Microbiology: Results for orders placed or performed during the hospital encounter of 07/23/22  Resp Panel by RT-PCR (Flu A&B, Covid) Anterior Nasal Swab     Status: None   Collection Time: 07/23/22  6:51 PM   Specimen: Anterior Nasal Swab  Result Value Ref Range Status   SARS Coronavirus 2 by RT PCR NEGATIVE NEGATIVE Final    Comment: (NOTE) SARS-CoV-2 target nucleic acids are NOT DETECTED.  The SARS-CoV-2 RNA is generally detectable in upper respiratory specimens during the acute phase of infection. The  lowest concentration of SARS-CoV-2 viral copies this assay can detect is 138 copies/mL. A negative result does not preclude SARS-Cov-2 infection and should not be used as the sole basis for treatment or other patient management decisions. A negative result may occur with  improper specimen collection/handling, submission of specimen other than nasopharyngeal swab, presence of viral mutation(s) within the areas targeted by this assay, and inadequate number of viral copies(<138 copies/mL). A negative result must be combined with clinical observations, patient history, and epidemiological information. The expected result is Negative.  Fact Sheet for Patients:  EntrepreneurPulse.com.au  Fact Sheet for Healthcare Providers:  IncredibleEmployment.be  This test is no t yet approved or cleared by the [place] and  has been authorized for detection and/or diagnosis of SARS-CoV-2 by FDA under an Emergency Use Authorization (EUA). This EUA will remain  in effect (meaning this test can be used) for the duration of the COVID-19 declaration under Section 564(b)(1) of the Act, 21 U.S.C.section 360bbb-3(b)(1), unless the authorization is terminated  or revoked sooner.       Influenza A by PCR NEGATIVE NEGATIVE Final   Influenza B by PCR NEGATIVE NEGATIVE Final    Comment: (NOTE) The Xpert Xpress SARS-CoV-2/FLU/RSV plus assay is intended as an aid in the diagnosis of influenza from Nasopharyngeal swab specimens and should not be used as a sole basis for treatment. Nasal washings and aspirates are unacceptable for Xpert Xpress SARS-CoV-2/FLU/RSV testing.  Fact Sheet for Patients: EntrepreneurPulse.com.au  Fact Sheet for Healthcare Providers: IncredibleEmployment.be  This test is not yet approved or cleared by the [place] FDA and has been authorized for detection and/or diagnosis of SARS-CoV-2 by FDA under  an Emergency Use Authorization (EUA). This EUA will remain in effect (meaning this test can be used) for the duration of the COVID-19 declaration under Section 564(b)(1) of the Act, 21 U.S.C. section 360bbb-3(b)(1), unless the authorization is terminated or revoked.  Performed at [facility], [place] [address] [place], [place] [address]   MRSA Next Gen by PCR, Nasal     Status: None   Collection Time: 07/23/22 11:25 PM   Specimen: Nasal Mucosa; Nasal Swab  Result Value Ref Range Status   MRSA by PCR Next Gen NOT DETECTED NOT DETECTED Final    Comment: (NOTE) The GeneXpert MRSA Assay (FDA approved for NASAL specimens only), is one component of a comprehensive MRSA colonization surveillance program. It is not intended to diagnose MRSA infection nor to guide or monitor treatment for MRSA infections. Test performance is not FDA approved in patients less than 48 years old. Performed at [facility], [place] [address], [place],  [address]     Labs: CBC: Recent Labs  Lab 07/23/22 1724 07/25/22 0325 07/26/22 0658 07/27/22 0550 07/28/22 0516  WBC 4.2 4.9 4.9 5.1 5.6  NEUTROABS 2.3  --   --   --   --   HGB 9.2* 8.4* 9.8* 8.2* 8.7*  HCT 27.0* 24.3* 30.0* 24.6* 26.5*  MCV 86.8 88.4 90.6 90.4 90.1  PLT 240 247 277 222 270   Basic Metabolic Panel: Recent Labs  Lab 07/24/22 0326 07/25/22 0325 07/26/22 0658 07/27/22 0550 07/28/22 0516  NA 140 134* 138 138 139  K 3.3* 3.7 3.6 3.8 3.9  CL 113* 110 110 112* 112*  CO2 21* 21* 24 21* 22  GLUCOSE 168* 262* 166* 175* 179*  BUN 25* 24* 28* 31* 31*  CREATININE 2.19* 2.40* 2.77* 2.44* 2.42*  CALCIUM 9.0 8.4* 9.1 8.4* 8.7*  MG 1.7  --   --   --  1.8  PHOS  --   --   --   --  4.0   Liver Function Tests: Recent Labs  Lab 07/23/22 1724 07/25/22 0325 07/28/22 0516  AST 15 10*  --   ALT 10 8  --   ALKPHOS 157* 104  --   BILITOT 0.6 0.4  --   PROT 7.4 5.9*  --   ALBUMIN 3.1* 2.3* 2.6*    CBG: Recent Labs  Lab 07/27/22 1119 07/27/22 1551 07/27/22 1607 07/27/22 2145 07/28/22 0753  GLUCAP 272* 65* 96 336* 212*    Discharge time spent: approximately 35 minutes spent on discharge counseling,  evaluation of patient on day of discharge, and coordination of discharge planning with nursing, social work, pharmacy and case management  Signed: Edwin Dada, MD [facility] 07/28/2022

## 2022-07-28 NOTE — Plan of Care (Signed)
Problem: Education: Goal: Ability to describe self-care measures that may prevent or decrease complications (Diabetes Survival Skills Education) will improve Outcome: Adequate for Discharge Goal: Individualized Educational Video(s) Outcome: Adequate for Discharge   Problem: Coping: Goal: Ability to adjust to condition or change in health will improve Outcome: Adequate for Discharge   Problem: Fluid Volume: Goal: Ability to maintain a balanced intake and output will improve Outcome: Adequate for Discharge   Problem: Health Behavior/Discharge Planning: Goal: Ability to identify and utilize available resources and services will improve Outcome: Adequate for Discharge Goal: Ability to manage health-related needs will improve Outcome: Adequate for Discharge   Problem: Metabolic: Goal: Ability to maintain appropriate glucose levels will improve Outcome: Adequate for Discharge   Problem: Nutritional: Goal: Maintenance of adequate nutrition will improve Outcome: Adequate for Discharge Goal: Progress toward achieving an optimal weight will improve Outcome: Adequate for Discharge   Problem: Skin Integrity: Goal: Risk for impaired skin integrity will decrease Outcome: Adequate for Discharge   Problem: Tissue Perfusion: Goal: Adequacy of tissue perfusion will improve Outcome: Adequate for Discharge   Problem: Education: Goal: Ability to describe self-care measures that may prevent or decrease complications (Diabetes Survival Skills Education) will improve Outcome: Adequate for Discharge Goal: Individualized Educational Video(s) Outcome: Adequate for Discharge   Problem: Cardiac: Goal: Ability to maintain an adequate cardiac output will improve Outcome: Adequate for Discharge   Problem: Health Behavior/Discharge Planning: Goal: Ability to identify and utilize available resources and services will improve Outcome: Adequate for Discharge Goal: Ability to manage health-related  needs will improve Outcome: Adequate for Discharge   Problem: Fluid Volume: Goal: Ability to achieve a balanced intake and output will improve Outcome: Adequate for Discharge   Problem: Metabolic: Goal: Ability to maintain appropriate glucose levels will improve Outcome: Adequate for Discharge   Problem: Nutritional: Goal: Maintenance of adequate nutrition will improve Outcome: Adequate for Discharge Goal: Maintenance of adequate weight for body size and type will improve Outcome: Adequate for Discharge   Problem: Respiratory: Goal: Will regain and/or maintain adequate ventilation Outcome: Adequate for Discharge   Problem: Urinary Elimination: Goal: Ability to achieve and maintain adequate renal perfusion and functioning will improve Outcome: Adequate for Discharge   Problem: Education: Goal: Knowledge of General Education information will improve Description: Including pain rating scale, medication(s)/side effects and non-pharmacologic comfort measures Outcome: Adequate for Discharge   Problem: Health Behavior/Discharge Planning: Goal: Ability to manage health-related needs will improve Outcome: Adequate for Discharge   Problem: Clinical Measurements: Goal: Ability to maintain clinical measurements within normal limits will improve Outcome: Adequate for Discharge Goal: Will remain free from infection Outcome: Adequate for Discharge Goal: Diagnostic test results will improve Outcome: Adequate for Discharge Goal: Respiratory complications will improve Outcome: Adequate for Discharge Goal: Cardiovascular complication will be avoided Outcome: Adequate for Discharge   Problem: Activity: Goal: Risk for activity intolerance will decrease Outcome: Adequate for Discharge   Problem: Nutrition: Goal: Adequate nutrition will be maintained Outcome: Adequate for Discharge   Problem: Coping: Goal: Level of anxiety will decrease Outcome: Adequate for Discharge   Problem:  Elimination: Goal: Will not experience complications related to bowel motility Outcome: Adequate for Discharge Goal: Will not experience complications related to urinary retention Outcome: Adequate for Discharge   Problem: Pain Managment: Goal: General experience of comfort will improve Outcome: Adequate for Discharge   Problem: Safety: Goal: Ability to remain free from injury will improve Outcome: Adequate for Discharge   Problem: Skin Integrity: Goal: Risk for impaired skin integrity will decrease   Outcome: Adequate for Discharge   Problem: Acute Rehab PT Goals(only PT should resolve) Goal: Pt Will Go Supine/Side To Sit Outcome: Adequate for Discharge Goal: Patient Will Transfer Sit To/From Stand Outcome: Adequate for Discharge Goal: Pt Will Ambulate Outcome: Adequate for Discharge Goal: Pt Will Go Up/Down Stairs Outcome: Adequate for Discharge

## 2022-07-28 NOTE — Progress Notes (Signed)
Assessment unchanged. Pt verbalized understanding of dc instructions including medications and follow up care. Discharged via wc to front entrance by NT.

## 2022-07-29 LAB — GLOMERULAR BASEMENT MEMBRANE ANTIBODIES: GBM Ab: <0.2 units (ref 0.0–0.9)

## 2022-07-29 LAB — KAPPA/LAMBDA LIGHT CHAINS
Kappa free light chain: 114.9 mg/L — ABNORMAL HIGH (ref 3.3–19.4)
Kappa, lambda light chain ratio: 2.72 — ABNORMAL HIGH (ref 0.26–1.65)
Lambda free light chains: 42.3 mg/L — ABNORMAL HIGH (ref 5.7–26.3)

## 2022-07-29 LAB — ANCA TITERS
Atypical P-ANCA titer: <1:20 {titer}
C-ANCA: <1:20 {titer}
P-ANCA: <1:20 {titer}

## 2022-07-29 LAB — C3 COMPLEMENT: C3 Complement: 110 mg/dL (ref 82–167)

## 2022-07-29 LAB — C4 COMPLEMENT: Complement C4, Body Fluid: 35 mg/dL (ref 12–38)

## 2022-07-30 LAB — PROTEIN ELECTROPHORESIS, SERUM
A/G Ratio: 0.8 (ref 0.7–1.7)
Albumin ELP: 2.7 g/dL — ABNORMAL LOW (ref 2.9–4.4)
Alpha-1-Globulin: 0.2 g/dL (ref 0.0–0.4)
Alpha-2-Globulin: 0.9 g/dL (ref 0.4–1.0)
Beta Globulin: 0.8 g/dL (ref 0.7–1.3)
Gamma Globulin: 1.4 g/dL (ref 0.4–1.8)
Globulin, Total: 3.3 g/dL (ref 2.2–3.9)
Total Protein ELP: 6 g/dL (ref 6.0–8.5)

## 2022-07-30 LAB — FANA STAINING PATTERNS: Speckled Pattern: 24529 — ABNORMAL HIGH

## 2022-07-30 LAB — ANTINUCLEAR ANTIBODIES, IFA: ANA Ab, IFA: POSITIVE — AB

## 2022-08-06 ENCOUNTER — Other Ambulatory Visit (HOSPITAL_COMMUNITY): Payer: Self-pay

## 2022-09-07 ENCOUNTER — Encounter (HOSPITAL_COMMUNITY): Payer: Self-pay

## 2022-09-07 ENCOUNTER — Emergency Department (HOSPITAL_COMMUNITY)
Admission: EM | Admit: 2022-09-07 | Discharge: 2022-09-07 | Discharge status: Against Medical Advice | Payer: Medicare Other | Attending: Emergency Medicine | Admitting: Emergency Medicine

## 2022-09-07 DIAGNOSIS — N189 Chronic kidney disease, unspecified: Secondary | ICD-10-CM | POA: Insufficient documentation

## 2022-09-07 DIAGNOSIS — Z5321 Procedure and treatment not carried out due to patient leaving prior to being seen by health care provider: Secondary | ICD-10-CM | POA: Diagnosis not present

## 2022-09-07 DIAGNOSIS — E162 Hypoglycemia, unspecified: Secondary | ICD-10-CM | POA: Insufficient documentation

## 2022-09-07 DIAGNOSIS — R42 Dizziness and giddiness: Secondary | ICD-10-CM | POA: Diagnosis present

## 2022-09-07 LAB — CBC WITH DIFFERENTIAL/PLATELET
Abs Immature Granulocytes: 0.02 10*3/uL (ref 0.00–0.07)
Basophils Absolute: 0 10*3/uL (ref 0.0–0.1)
Basophils Relative: 0 %
Eosinophils Absolute: 0 10*3/uL (ref 0.0–0.5)
Eosinophils Relative: 0 %
HCT: 25.6 % — ABNORMAL LOW (ref 36.0–46.0)
Hemoglobin: 7.9 g/dL — ABNORMAL LOW (ref 12.0–15.0)
Immature Granulocytes: 0 %
Lymphocytes Relative: 30 %
Lymphs Abs: 1.9 10*3/uL (ref 0.7–4.0)
MCH: 28.8 pg (ref 26.0–34.0)
MCHC: 30.9 g/dL (ref 30.0–36.0)
MCV: 93.4 fL (ref 80.0–100.0)
Monocytes Absolute: 0.4 10*3/uL (ref 0.1–1.0)
Monocytes Relative: 6 %
Neutro Abs: 3.9 10*3/uL (ref 1.7–7.7)
Neutrophils Relative %: 64 %
Platelets: 242 10*3/uL (ref 150–400)
RBC: 2.74 MIL/uL — ABNORMAL LOW (ref 3.87–5.11)
RDW: 13.2 % (ref 11.5–15.5)
WBC: 6.2 10*3/uL (ref 4.0–10.5)
nRBC: 0 % (ref 0.0–0.2)

## 2022-09-07 LAB — BASIC METABOLIC PANEL
Anion gap: 4 — ABNORMAL LOW (ref 5–15)
BUN: 34 mg/dL — ABNORMAL HIGH (ref 8–23)
CO2: 20 mmol/L — ABNORMAL LOW (ref 22–32)
Calcium: 8.2 mg/dL — ABNORMAL LOW (ref 8.9–10.3)
Chloride: 115 mmol/L — ABNORMAL HIGH (ref 98–111)
Creatinine, Ser: 2.24 mg/dL — ABNORMAL HIGH (ref 0.44–1.00)
GFR, Estimated: 24 mL/min — ABNORMAL LOW (ref >60)
Glucose, Bld: 110 mg/dL — ABNORMAL HIGH (ref 70–99)
Potassium: 3.7 mmol/L (ref 3.5–5.1)
Sodium: 139 mmol/L (ref 135–145)

## 2022-09-07 LAB — CBG MONITORING, ED: Glucose-Capillary: 114 mg/dL — ABNORMAL HIGH (ref 70–99)

## 2022-09-07 NOTE — ED Triage Notes (Signed)
Pt presents with c/o hypoglycemia. Pt reports her CBG was in the 30's at home, ate some oatmeal to correct it. CBG is 114 upon arrival to the ER.

## 2022-09-07 NOTE — ED Notes (Signed)
Given Pt labels by registration. Pt left AMA

## 2022-09-07 NOTE — ED Provider Triage Note (Signed)
Emergency Medicine Provider Triage Evaluation Note  Tricia Huynh , a 66 y.o. female  was evaluated in triage.  Pt complains of low blood sugar spells.  Patient states that she had an episode around 3 AM today and this afternoon as well.  She reports feeling woozy, lightheaded, sweating.  This morning she ate oatmeal which helped her symptoms and then this afternoon she drank lemonade.  She does take insulin.  States that her dosage was recently decreased to 5 units.  In addition, she also has chronic kidney disease.  Currently she is feeling improved, but not 100% back to baseline.  Blood sugar 114 on arrival.  Review of Systems  Positive: Lightheaded Negative: Chest pain, abdominal pain, nausea, vomiting, diarrhea  Physical Exam  BP (!) 201/110 (BP Location: Left Arm)   Pulse 72   Temp (!) 97.5 F (36.4 C) (Oral)   Resp 18   Ht '5\' 8"'$  (1.727 m)   Wt 51.7 kg   SpO2 100%   BMI 17.33 kg/m  Gen:   Awake, no distress   Resp:  Normal effort  MSK:   Moves extremities without difficulty  Other:    Medical Decision Making  Medically screening exam initiated at 4:58 PM.  Appropriate orders placed.  [name] was informed that the remainder of the evaluation will be completed by another provider, this initial triage assessment does not replace that evaluation, and the importance of remaining in the ED until their evaluation is complete.     Carlisle Cater, PA-C 09/07/22 1700

## 2022-09-18 ENCOUNTER — Inpatient Hospital Stay (HOSPITAL_COMMUNITY)
Admission: EM | Admit: 2022-09-18 | Discharge: 2022-09-21 | DRG: 305 | Disposition: A | Discharge status: Home / Self Care | Payer: Medicare Other | Attending: Internal Medicine | Admitting: Internal Medicine

## 2022-09-18 ENCOUNTER — Emergency Department (HOSPITAL_COMMUNITY): Payer: Medicare Other

## 2022-09-18 ENCOUNTER — Encounter (HOSPITAL_COMMUNITY): Payer: Medicare Other

## 2022-09-18 ENCOUNTER — Inpatient Hospital Stay (HOSPITAL_COMMUNITY): Payer: Medicare Other

## 2022-09-18 DIAGNOSIS — E1122 Type 2 diabetes mellitus with diabetic chronic kidney disease: Secondary | ICD-10-CM | POA: Diagnosis present

## 2022-09-18 DIAGNOSIS — Z885 Allergy status to narcotic agent status: Secondary | ICD-10-CM

## 2022-09-18 DIAGNOSIS — E785 Hyperlipidemia, unspecified: Secondary | ICD-10-CM | POA: Diagnosis present

## 2022-09-18 DIAGNOSIS — Z79899 Other long term (current) drug therapy: Secondary | ICD-10-CM

## 2022-09-18 DIAGNOSIS — Z8249 Family history of ischemic heart disease and other diseases of the circulatory system: Secondary | ICD-10-CM

## 2022-09-18 DIAGNOSIS — R569 Unspecified convulsions: Secondary | ICD-10-CM | POA: Diagnosis present

## 2022-09-18 DIAGNOSIS — E872 Acidosis, unspecified: Secondary | ICD-10-CM | POA: Diagnosis present

## 2022-09-18 DIAGNOSIS — Z841 Family history of disorders of kidney and ureter: Secondary | ICD-10-CM | POA: Diagnosis not present

## 2022-09-18 DIAGNOSIS — N179 Acute kidney failure, unspecified: Secondary | ICD-10-CM | POA: Diagnosis present

## 2022-09-18 DIAGNOSIS — I161 Hypertensive emergency: Secondary | ICD-10-CM | POA: Diagnosis present

## 2022-09-18 DIAGNOSIS — E1165 Type 2 diabetes mellitus with hyperglycemia: Secondary | ICD-10-CM | POA: Diagnosis present

## 2022-09-18 DIAGNOSIS — Z90710 Acquired absence of both cervix and uterus: Secondary | ICD-10-CM | POA: Diagnosis not present

## 2022-09-18 DIAGNOSIS — D631 Anemia in chronic kidney disease: Secondary | ICD-10-CM | POA: Diagnosis present

## 2022-09-18 DIAGNOSIS — Z794 Long term (current) use of insulin: Secondary | ICD-10-CM

## 2022-09-18 DIAGNOSIS — E1140 Type 2 diabetes mellitus with diabetic neuropathy, unspecified: Secondary | ICD-10-CM | POA: Diagnosis present

## 2022-09-18 DIAGNOSIS — G9340 Encephalopathy, unspecified: Secondary | ICD-10-CM

## 2022-09-18 DIAGNOSIS — N184 Chronic kidney disease, stage 4 (severe): Secondary | ICD-10-CM | POA: Diagnosis present

## 2022-09-18 DIAGNOSIS — Z833 Family history of diabetes mellitus: Secondary | ICD-10-CM

## 2022-09-18 DIAGNOSIS — Z87891 Personal history of nicotine dependence: Secondary | ICD-10-CM

## 2022-09-18 DIAGNOSIS — I674 Hypertensive encephalopathy: Secondary | ICD-10-CM | POA: Diagnosis present

## 2022-09-18 DIAGNOSIS — D509 Iron deficiency anemia, unspecified: Secondary | ICD-10-CM | POA: Diagnosis present

## 2022-09-18 DIAGNOSIS — E876 Hypokalemia: Secondary | ICD-10-CM | POA: Diagnosis present

## 2022-09-18 DIAGNOSIS — I160 Hypertensive urgency: Secondary | ICD-10-CM | POA: Diagnosis not present

## 2022-09-18 DIAGNOSIS — I129 Hypertensive chronic kidney disease with stage 1 through stage 4 chronic kidney disease, or unspecified chronic kidney disease: Secondary | ICD-10-CM | POA: Diagnosis present

## 2022-09-18 LAB — COMPREHENSIVE METABOLIC PANEL
ALT: 16 U/L (ref 0–44)
AST: 21 U/L (ref 15–41)
Albumin: 3 g/dL — ABNORMAL LOW (ref 3.5–5.0)
Alkaline Phosphatase: 126 U/L (ref 38–126)
Anion gap: 6 (ref 5–15)
BUN: 29 mg/dL — ABNORMAL HIGH (ref 8–23)
CO2: 21 mmol/L — ABNORMAL LOW (ref 22–32)
Calcium: 8.6 mg/dL — ABNORMAL LOW (ref 8.9–10.3)
Chloride: 115 mmol/L — ABNORMAL HIGH (ref 98–111)
Creatinine, Ser: 2.51 mg/dL — ABNORMAL HIGH (ref 0.44–1.00)
GFR, Estimated: 21 mL/min — ABNORMAL LOW (ref >60)
Glucose, Bld: 136 mg/dL — ABNORMAL HIGH (ref 70–99)
Potassium: 3.9 mmol/L (ref 3.5–5.1)
Sodium: 142 mmol/L (ref 135–145)
Total Bilirubin: 0.4 mg/dL (ref 0.3–1.2)
Total Protein: 7.5 g/dL (ref 6.5–8.1)

## 2022-09-18 LAB — BLOOD GAS, VENOUS
Acid-base deficit: 3.8 mmol/L — ABNORMAL HIGH (ref 0.0–2.0)
Bicarbonate: 20.7 mmol/L (ref 20.0–28.0)
O2 Saturation: 85.7 %
Patient temperature: 36.6
pCO2, Ven: 34 mmHg — ABNORMAL LOW (ref 44–60)
pH, Ven: 7.39 (ref 7.25–7.43)
pO2, Ven: 49 mmHg — ABNORMAL HIGH (ref 32–45)

## 2022-09-18 LAB — CBG MONITORING, ED
Glucose-Capillary: 132 mg/dL — ABNORMAL HIGH (ref 70–99)
Glucose-Capillary: 208 mg/dL — ABNORMAL HIGH (ref 70–99)

## 2022-09-18 LAB — URINALYSIS, ROUTINE W REFLEX MICROSCOPIC
Bacteria, UA: NONE SEEN
Bilirubin Urine: NEGATIVE
Glucose, UA: 50 mg/dL — AB
Hgb urine dipstick: NEGATIVE
Ketones, ur: NEGATIVE mg/dL
Leukocytes,Ua: NEGATIVE
Nitrite: NEGATIVE
Protein, ur: >=300 mg/dL — AB
Specific Gravity, Urine: 1.009 (ref 1.005–1.030)
pH: 6 (ref 5.0–8.0)

## 2022-09-18 LAB — CBC WITH DIFFERENTIAL/PLATELET
Abs Immature Granulocytes: 0.02 10*3/uL (ref 0.00–0.07)
Basophils Absolute: 0 10*3/uL (ref 0.0–0.1)
Basophils Relative: 0 %
Eosinophils Absolute: 0 10*3/uL (ref 0.0–0.5)
Eosinophils Relative: 0 %
HCT: 29.8 % — ABNORMAL LOW (ref 36.0–46.0)
Hemoglobin: 9.3 g/dL — ABNORMAL LOW (ref 12.0–15.0)
Immature Granulocytes: 0 %
Lymphocytes Relative: 31 %
Lymphs Abs: 1.9 10*3/uL (ref 0.7–4.0)
MCH: 28.8 pg (ref 26.0–34.0)
MCHC: 31.2 g/dL (ref 30.0–36.0)
MCV: 92.3 fL (ref 80.0–100.0)
Monocytes Absolute: 0.4 10*3/uL (ref 0.1–1.0)
Monocytes Relative: 6 %
Neutro Abs: 3.8 10*3/uL (ref 1.7–7.7)
Neutrophils Relative %: 63 %
Platelets: 285 10*3/uL (ref 150–400)
RBC: 3.23 MIL/uL — ABNORMAL LOW (ref 3.87–5.11)
RDW: 13.4 % (ref 11.5–15.5)
WBC: 6.1 10*3/uL (ref 4.0–10.5)
nRBC: 0 % (ref 0.0–0.2)

## 2022-09-18 LAB — GLUCOSE, CAPILLARY
Glucose-Capillary: 146 mg/dL — ABNORMAL HIGH (ref 70–99)
Glucose-Capillary: 106 mg/dL — ABNORMAL HIGH (ref 70–99)
Glucose-Capillary: 115 mg/dL — ABNORMAL HIGH (ref 70–99)

## 2022-09-18 LAB — TSH: TSH: 2.409 u[IU]/mL (ref 0.350–4.500)

## 2022-09-18 LAB — C-REACTIVE PROTEIN: CRP: <0.5 mg/dL (ref <1.0)

## 2022-09-18 LAB — AMMONIA: Ammonia: 22 umol/L (ref 9–35)

## 2022-09-18 LAB — RAPID URINE DRUG SCREEN, HOSP PERFORMED
Amphetamines: NOT DETECTED
Barbiturates: NOT DETECTED
Benzodiazepines: NOT DETECTED
Cocaine: NOT DETECTED
Opiates: NOT DETECTED
Tetrahydrocannabinol: NOT DETECTED

## 2022-09-18 LAB — T4, FREE: Free T4: 0.75 ng/dL (ref 0.61–1.12)

## 2022-09-18 LAB — LACTIC ACID, PLASMA: Lactic Acid, Venous: 1 mmol/L (ref 0.5–1.9)

## 2022-09-18 LAB — ETHANOL: Alcohol, Ethyl (B): <10 mg/dL (ref <10)

## 2022-09-18 LAB — SEDIMENTATION RATE: Sed Rate: 88 mm/hr — ABNORMAL HIGH (ref 0–22)

## 2022-09-18 LAB — MAGNESIUM: Magnesium: 2.1 mg/dL (ref 1.7–2.4)

## 2022-09-18 MED ORDER — ONDANSETRON HCL 4 MG/2ML IJ SOLN: 4.0000 mg | Freq: Four times a day (QID) | INTRAMUSCULAR | Status: DC | PRN

## 2022-09-18 MED ORDER — VANCOMYCIN HCL IN DEXTROSE 1-5 GM/200ML-% IV SOLN
1000.0000 mg | Freq: Once | INTRAVENOUS | Status: AC
  Administered 2022-09-18: 1000 mg via INTRAVENOUS
  Filled 2022-09-18: qty 200

## 2022-09-18 MED ORDER — CHLORHEXIDINE GLUCONATE CLOTH 2 % EX PADS
6.0000 | MEDICATED_PAD | Freq: Every day | CUTANEOUS | Status: DC
  Administered 2022-09-18 – 2022-09-19 (×2): 6 via TOPICAL

## 2022-09-18 MED ORDER — POLYETHYLENE GLYCOL 3350 17 G PO PACK
17.0000 g | PACK | Freq: Every day | ORAL | Status: DC | PRN
  Administered 2022-09-19 – 2022-09-21 (×3): 17 g via ORAL
  Filled 2022-09-18 (×3): qty 1

## 2022-09-18 MED ORDER — METRONIDAZOLE 500 MG/100ML IV SOLN
500.0000 mg | Freq: Once | INTRAVENOUS | Status: AC
  Administered 2022-09-18: 500 mg via INTRAVENOUS
  Filled 2022-09-18: qty 100

## 2022-09-18 MED ORDER — ORAL CARE MOUTH RINSE: 15.0000 mL | OROMUCOSAL | Status: DC | PRN

## 2022-09-18 MED ORDER — ACETAMINOPHEN 325 MG PO TABS
650.0000 mg | ORAL_TABLET | Freq: Once | ORAL | Status: AC | PRN
  Administered 2022-09-18: 650 mg via ORAL
  Filled 2022-09-18: qty 2

## 2022-09-18 MED ORDER — INSULIN ASPART 100 UNIT/ML IJ SOLN
0.0000 [IU] | INTRAMUSCULAR | Status: DC
  Administered 2022-09-18 – 2022-09-19 (×3): 1 [IU] via SUBCUTANEOUS
  Filled 2022-09-18: qty 0.09

## 2022-09-18 MED ORDER — CLEVIDIPINE BUTYRATE 0.5 MG/ML IV EMUL
0.0000 mg/h | INTRAVENOUS | Status: DC
  Administered 2022-09-18: 2 mg/h via INTRAVENOUS
  Administered 2022-09-18: 1 mg/h via INTRAVENOUS
  Administered 2022-09-18: 4 mg/h via INTRAVENOUS
  Administered 2022-09-19: 3 mg/h via INTRAVENOUS
  Filled 2022-09-18 (×4): qty 50

## 2022-09-18 MED ORDER — SODIUM CHLORIDE 0.9 % IV SOLN
2.0000 g | Freq: Once | INTRAVENOUS | Status: AC
  Administered 2022-09-18: 2 g via INTRAVENOUS
  Filled 2022-09-18: qty 12.5

## 2022-09-18 MED ORDER — LEVETIRACETAM IN NACL 500 MG/100ML IV SOLN
500.0000 mg | Freq: Two times a day (BID) | INTRAVENOUS | Status: DC
  Administered 2022-09-18 – 2022-09-19 (×2): 500 mg via INTRAVENOUS
  Filled 2022-09-18 (×3): qty 100

## 2022-09-18 MED ORDER — LABETALOL HCL 5 MG/ML IV SOLN
10.0000 mg | Freq: Once | INTRAVENOUS | Status: AC
  Administered 2022-09-18: 10 mg via INTRAVENOUS
  Filled 2022-09-18: qty 4

## 2022-09-18 MED ORDER — DOCUSATE SODIUM 100 MG PO CAPS
100.0000 mg | ORAL_CAPSULE | Freq: Two times a day (BID) | ORAL | Status: DC | PRN
  Administered 2022-09-20: 100 mg via ORAL
  Filled 2022-09-18: qty 1

## 2022-09-18 MED ORDER — LACTATED RINGERS IV BOLUS
1000.0000 mL | Freq: Once | INTRAVENOUS | Status: AC
  Administered 2022-09-18: 1000 mL via INTRAVENOUS

## 2022-09-18 NOTE — ED Triage Notes (Signed)
Pt BIB [facility] from home for AMS. Daughter called EMS after finding pt unresponsive in bed. Pt has hx of seizures, one episode of incontinence PTA. Also has hx of DM.   CBG 116 with EMS.

## 2022-09-18 NOTE — ED Notes (Signed)
Patient transported to MRI 

## 2022-09-18 NOTE — Progress Notes (Signed)
A consult was received from an ED physician for cefepime & vancomycin per pharmacy dosing.  The patient's profile has been reviewed for ht/wt/allergies/indication/available labs.    A one time order has been placed for cefepime 2 gm & vancomycin 1000 mg.    Further antibiotics/pharmacy consults should be ordered by admitting physician if indicated.                       Thank you,  Eudelia Bunch, Pharm.D 09/18/2022 10:48 AM

## 2022-09-18 NOTE — ED Notes (Signed)
Per vascular US, pt needs to be NPO after midnight tonight. Will notify primary RN.

## 2022-09-18 NOTE — H&P (Signed)
NAME:  Tricia Huynh, MRN:  [id number], DOB:  21-Nov-1955, LOS: 0 ADMISSION DATE:  09/18/2022, CONSULTATION DATE:  09/18/22 REFERRING MD:  Regenia Skeeter - EM, CHIEF COMPLAINT:  Ams    History of Present Illness:  66 yo F PMH HTN IDDM CKD3a Fe Def anemia, prior seizure presented to [facility] ED 11/2 with AMS. History is limited, there is a daughter and niece at the bedside -- daughter states the patient was "out of it" for an unknown period of time and was told to call EMS. There was one episode of incontinence. There is concern for possible seizure, of which pt has a reported history and may have had a sz in 2022 prompting ER presentation, for which she was Rx keppra but has not presented to OP neuro follow up appts.   In ED, SBP 230s. Received '10mg'$  labetalol and started on cleviprex.  It is not clear if the patient has missed any home meds or not.   PCCM consulted for admission in this setting   Pertinent  Medical History  IDDM CKD3a HTN Seizure  Tobacco use  Significant Hospital Events: Including procedures, antibiotic start and stop dates in addition to other pertinent events   11/2 ED w AMS. Possible Sz. HTN crisis. On cleviprex. Admit to ICU   Interim History / Subjective:   On 2 cleviprex   Objective   Blood pressure (!) 203/99, pulse 82, temperature (!) 97.2 F (36.2 C), temperature source Rectal, resp. rate 14, SpO2 93 %.        Intake/Output Summary (Last 24 hours) at 09/18/2022 1356 Last data filed at 09/18/2022 1103 Gross per 24 hour  Intake --  Output 500 ml  Net -500 ml   There were no vitals filed for this visit.  Examination: General: chronically ill appearing F NAD HENT: NCAT pink mm Lungs: anicteric sclera  Cardiovascular: rr cap refill brisk. Bounding pulses  Abdomen: soft ndnt  Extremities: no acute joint deformity  Neuro: lethargic. Awakens. PERRL GU: defer   Resolved Hospital Problem list     Assessment & Plan:   Acute encephalopathy-- ?  PRES Possible seizure with hx prior seizure  -suspect acute encephalopathy r/t hypertensive crisis. Possible seizure.  UDS neg. Ct H no acute abnormality. Labs not particularly c/w sepsis  -Looks like she had sz in 2016, 2017. Stopped Rx keppra in 2020 then presented to ED in 2022 a few times for possible sz, did not require admission, was Rx keppra and antihypertensives and went home. Has no-showed to neuro outpt and is not taking keppra currently as far as we can tell P -maintain NPO (if mentation improves, keep NPO for renal artery [place] 11/3) -MRI brain -- if acute finding will consult neuro -cerebell -- will reach out to epileptologist  -seizure precautions  -HTN as below   Hypertensive emergency -difficult to control. On norvasc '10mg'$ , coreg '25mg'$  BID, hydral '25mg'$  q8hr at home P -cleviprex for goal MAP 130 until tomorrow  -will add back home meds as appropriate   AKI on CKD 3b Proteinuria  -last admission nephro was consulted and ordered serologies -- ANA was "positive" (> 1:80), ANCA < 1:20, GMB ab <0.2. Noted to have speckled pattern  -? GN. No known rheum dz but ?lupus nephritis  P -trend renal indices, UOP  -repeat ANA  -likely nephro consult this admission  -will order a renal artery [place]. She is already NPO (in setting of her encephalopathy), please keep NPO if mentation improves for ultrasound (has  to be NPO x8hr) -pending course, wonder if there might be a role for renal bx   Hypothermia -think likely environmental. Labs not c/w sepsis.  P -will dc abx -can use bair hugger if needed -trend fever curve, wbc count   IDDM Diabetic neuropathy  P -hold home elavil and gabapentin with current encephalopathy -SSI while NPO   Iron deficiency anemia -will restart home Fe when taking POs -PRN CBC  Best Practice (right click and "Reselect all SmartList Selections" daily)   Diet/type: NPO DVT prophylaxis: SCD GI prophylaxis: N/A Lines: N/A Foley:  N/A Code Status:  full  code Last date of multidisciplinary goals of care discussion [daughter and niece updated at bedside 11/2]  Labs   CBC: Recent Labs  Lab 09/18/22 1030  WBC 6.1  NEUTROABS 3.8  HGB 9.3*  HCT 29.8*  MCV 92.3  PLT 737    Basic Metabolic Panel: Recent Labs  Lab 09/18/22 1030  NA 142  K 3.9  CL 115*  CO2 21*  GLUCOSE 136*  BUN 29*  CREATININE 2.51*  CALCIUM 8.6*  MG 2.1   GFR: Estimated Creatinine Clearance: 18 mL/min (A) (by C-G formula based on SCr of 2.51 mg/dL (H)). Recent Labs  Lab 09/18/22 1030  WBC 6.1  LATICACIDVEN 1.0    Liver Function Tests: Recent Labs  Lab 09/18/22 1030  AST 21  ALT 16  ALKPHOS 126  BILITOT 0.4  PROT 7.5  ALBUMIN 3.0*   No results for input(s): "LIPASE", "AMYLASE" in the last 168 hours. Recent Labs  Lab 09/18/22 1030  AMMONIA 22    ABG    Component Value Date/Time   HCO3 22.2 07/23/2022 1724   TCO2 24 11/02/2019 1706   ACIDBASEDEF 4.1 (H) 07/23/2022 1724   O2SAT 56 07/23/2022 1724     Coagulation Profile: No results for input(s): "INR", "PROTIME" in the last 168 hours.  Cardiac Enzymes: No results for input(s): "CKTOTAL", "CKMB", "CKMBINDEX", "TROPONINI" in the last 168 hours.  HbA1C: Hemoglobin A1C  Date/Time Value Ref Range Status  12/06/2015 12:00 AM 11.8  Final   Hgb A1c MFr Bld  Date/Time Value Ref Range Status  04/27/2022 09:37 PM 10.3 (H) 4.8 - 5.6 % Final    Comment:    (NOTE) Pre diabetes:          5.7%-6.4%  Diabetes:              >6.4%  Glycemic control for   <7.0% adults with diabetes   12/16/2020 12:00 AM 9.2 (H) 4.8 - 5.6 % Final    Comment:    (NOTE) Pre diabetes:          5.7%-6.4%  Diabetes:              >6.4%  Glycemic control for   <7.0% adults with diabetes     CBG: Recent Labs  Lab 09/18/22 1015  GLUCAP 132*    Review of Systems:   Unable to obtain due to encephalopathy   Past Medical History:  She,  has a past medical history of Diabetes mellitus type 2 in  nonobese ([place]) (06/06/2015), Diabetes mellitus without complication ([place]), Diabetic neuropathy ([place]) (06/06/2015), Dyslipidemia (06/06/2015), Encephalopathy, Essential hypertension (06/06/2015), Hypertension, Mood disorder ([place]) (06/06/2015), Pneumonia (03/11/2017), and Seizure ([place]).   Surgical History:   Past Surgical History:  Procedure Laterality Date   ABDOMINAL HYSTERECTOMY     EYE SURGERY Bilateral    TUBAL LIGATION       Social History:   reports  that she has quit smoking. Her smoking use included cigarettes. She has never used smokeless tobacco. She reports that she does not drink alcohol and does not use drugs.   Family History:  Her family history includes Diabetes in her mother; Heart disease in her father; Kidney disease in her father; Seizures in her brother and sister.   Allergies Allergies  Allergen Reactions   Percocet [Oxycodone-Acetaminophen] Nausea And Vomiting and Other (See Comments)    Causes the patient to be shaky/unsteady, also   Vicodin [Hydrocodone-Acetaminophen] Nausea And Vomiting and Other (See Comments)    Causes the patient to be shaky/unsteady, also     Home Medications  Prior to Admission medications   Medication Sig Start Date End Date Taking? Authorizing Provider  amitriptyline (ELAVIL) 50 MG tablet Take 50 mg by mouth daily. 05/08/15   [provider]  amLODipine (NORVASC) 10 MG tablet Take 1 tablet (10 mg total) by mouth daily. 07/29/22   Danford, Suann Larry, MD  atorvastatin (LIPITOR) 40 MG tablet Take 40 mg by mouth daily. 12/23/19   [provider]  carvedilol (COREG) 25 MG tablet Take 1 tablet (25 mg total) by mouth 2 (two) times daily with a meal. 07/28/22   Danford, Suann Larry, MD  ferrous sulfate 325 (65 FE) MG tablet Take 1 tablet (325 mg total) by mouth daily with breakfast. 05/02/22   Raiford Noble Latif, DO  gabapentin (NEURONTIN) 100 MG capsule Take 1 capsule (100 mg total) by mouth at bedtime. 07/28/22   Danford,  Suann Larry, MD  hydrALAZINE (APRESOLINE) 25 MG tablet Take 1 tablet (25 mg total) by mouth every 8 (eight) hours. 07/28/22   Danford, Suann Larry, MD  insulin glargine (LANTUS SOLOSTAR) 100 UNIT/ML Solostar Pen Inject 5 Units into the skin daily. Discard pen after 28 days from first use 07/28/22   Danford, Suann Larry, MD  Insulin Pen Needle (TECHLITE PEN NEEDLES) 32G X 4 MM MISC Use to inject lantus once a day 07/28/22   Danford, Suann Larry, MD  Multiple Vitamin (MULTIVITAMIN WITH MINERALS) TABS tablet Take 1 tablet by mouth daily. 05/02/22   Sheikh, Omair Latif, DO  senna-docusate (SENOKOT-S) 8.6-50 MG tablet Take 1 tablet by mouth at bedtime. 05/01/22   Kerney Elbe, DO     Critical care time: 50 minutes     CRITICAL CARE Performed by: Cristal Generous   Total critical care time: 50 minutes  Critical care time was exclusive of separately billable procedures and treating other patients.  Critical care was necessary to treat or prevent imminent or life-threatening deterioration.  Critical care was time spent personally by me on the following activities: development of treatment plan with patient and/or surrogate as well as nursing, discussions with consultants, evaluation of patient's response to treatment, examination of patient, obtaining history from patient or surrogate, ordering and performing treatments and interventions, ordering and review of laboratory studies, ordering and review of radiographic studies, pulse oximetry and re-evaluation of patient's condition.  Eliseo Gum MSN, AGACNP-BC [place] for pager  09/18/2022, 1:56 PM

## 2022-09-18 NOTE — Progress Notes (Signed)
[facility] Progress Note Patient Name: Tricia Huynh DOB: Apr 29, 1956 MRN: [id number]   Date of Service  09/18/2022  HPI/Events of Note  Patient complaining of a headache, requesting PRN pain meds.  Patient is NPO   eICU Interventions  Patient is NPO after midnight for [place] Tylenol PO x 1 ordered     Intervention Category Minor Interventions: Routine modifications to care plan (e.g. PRN medications for pain, fever)  Shona Needles Ladavion Savitz 09/18/2022, 8:24 PM

## 2022-09-18 NOTE — ED Provider Notes (Signed)
[place] DEPT Provider Note   CSN: [id number] Arrival date & time: 09/18/22  1007     History  No chief complaint on file.   Tricia Huynh is a 66 y.o. female.  HPI 66 year old female presents with altered mental status.  History is initially from EMS.  Patient was last seen well sometime yesterday.  This morning family found her in the bed and she was altered.  They presume she had a seizure but was not really waking up after 30 minutes and so EMS was called.  EMS originally gave her a GCS of about 8 or 9 as she would open her eyes to her name but then close them.  However she was not really following commands.  Initial blood pressure was in the 200s.  Her glucose was around 150.  Patient has a history of seizures but has not had one in a while.  Otherwise, history is unavailable.  Home Medications Prior to Admission medications   Medication Sig Start Date End Date Taking? Authorizing Provider  amitriptyline (ELAVIL) 50 MG tablet Take 50 mg by mouth daily. 05/08/15   [provider]  amLODipine (NORVASC) 10 MG tablet Take 1 tablet (10 mg total) by mouth daily. 07/29/22   Danford, Suann Larry, MD  atorvastatin (LIPITOR) 40 MG tablet Take 40 mg by mouth daily. 12/23/19   [provider]  carvedilol (COREG) 25 MG tablet Take 1 tablet (25 mg total) by mouth 2 (two) times daily with a meal. 07/28/22   Danford, Suann Larry, MD  ferrous sulfate 325 (65 FE) MG tablet Take 1 tablet (325 mg total) by mouth daily with breakfast. 05/02/22   Raiford Noble Latif, DO  gabapentin (NEURONTIN) 100 MG capsule Take 1 capsule (100 mg total) by mouth at bedtime. 07/28/22   Danford, Suann Larry, MD  hydrALAZINE (APRESOLINE) 25 MG tablet Take 1 tablet (25 mg total) by mouth every 8 (eight) hours. 07/28/22   Danford, Suann Larry, MD  insulin glargine (LANTUS SOLOSTAR) 100 UNIT/ML Solostar Pen Inject 5 Units into the skin daily. Discard pen after 28 days  from first use 07/28/22   Danford, Suann Larry, MD  Insulin Pen Needle (TECHLITE PEN NEEDLES) 32G X 4 MM MISC Use to inject lantus once a day 07/28/22   Danford, Suann Larry, MD  Multiple Vitamin (MULTIVITAMIN WITH MINERALS) TABS tablet Take 1 tablet by mouth daily. 05/02/22   Sheikh, Omair Latif, DO  senna-docusate (SENOKOT-S) 8.6-50 MG tablet Take 1 tablet by mouth at bedtime. 05/01/22   Kerney Elbe, DO      Allergies    Percocet [oxycodone-acetaminophen] and Vicodin [hydrocodone-acetaminophen]    Review of Systems   Review of Systems  Unable to perform ROS: Mental status change    Physical Exam Updated Vital Signs BP (!) 241/132   Pulse 82   Temp (!) 93.1 F (33.9 C) (Rectal)   Resp 12   SpO2 100%  Physical Exam Vitals and nursing note reviewed.  Constitutional:      Appearance: She is well-developed. She is not diaphoretic.  HENT:     Head: Normocephalic and atraumatic.  Eyes:     Comments: Pupils appear mid-sized, but she closes her eyes or moves eyes away when trying to test with light  Cardiovascular:     Rate and Rhythm: Normal rate and regular rhythm.     Heart sounds: Normal heart sounds.  Pulmonary:     Effort: Pulmonary effort is normal.  Breath sounds: Normal breath sounds.  Abdominal:     General: There is no distension.     Palpations: Abdomen is soft.     Tenderness: There is no abdominal tenderness.  Musculoskeletal:     Cervical back: No rigidity.  Skin:    General: Skin is warm and dry.  Neurological:     Mental Status: She is alert.     Comments: Patient is not really following many commands.  Wants stick her tongue out.  She does look around and seem to acknowledge my voice.  When I hold her arms up, she weakly holds both of them up and then slowly lowers them.  Maybe a little bit of tremor.  On the legs, the left leg might be a little weaker than the right but it is hard to tell due to poor compliance.     ED Results / Procedures /  Treatments   Labs (all labs ordered are listed, but only abnormal results are displayed) Labs Reviewed  CBG MONITORING, ED - Abnormal; Notable for the following components:      Result Value   Glucose-Capillary 132 (*)    All other components within normal limits  CULTURE, BLOOD (ROUTINE X 2)  CULTURE, BLOOD (ROUTINE X 2)  COMPREHENSIVE METABOLIC PANEL  CBC WITH DIFFERENTIAL/PLATELET  URINALYSIS, ROUTINE W REFLEX MICROSCOPIC  AMMONIA  RAPID URINE DRUG SCREEN, HOSP PERFORMED  ETHANOL  MAGNESIUM  LACTIC ACID, PLASMA  LACTIC ACID, PLASMA  TSH  T4, FREE    EKG EKG Interpretation  Date/Time:  Thursday September 18 2022 10:15:06 EDT Ventricular Rate:  83 PR Interval:  191 QRS Duration: 90 QT Interval:  428 QTC Calculation: 503 R Axis:   -31 Text Interpretation: Sinus rhythm Consider left atrial enlargement Left axis deviation Borderline low voltage, extremity leads Prolonged QT interval similar to Sept 2023 Confirmed by Sherwood Gambler [phone]) on 09/18/2022 10:25:00 AM  Radiology CT HEAD WO CONTRAST  Result Date: 09/18/2022 CLINICAL DATA:  Mental status change, unknown cause EXAM: CT HEAD WITHOUT CONTRAST TECHNIQUE: Contiguous axial images were obtained from the base of the skull through the vertex without intravenous contrast. RADIATION DOSE REDUCTION: This exam was performed according to the departmental dose-optimization program which includes automated exposure control, adjustment of the mA and/or kV according to patient size and/or use of iterative reconstruction technique. COMPARISON:  07/23/2022 FINDINGS: Brain: No evidence of acute infarction, hemorrhage, hydrocephalus, extra-axial collection or mass lesion/mass effect. Vascular: No hyperdense vessel or unexpected calcification. Skull: Normal. Negative for fracture or focal lesion. Sinuses/Orbits: No acute finding. Other: None. IMPRESSION: No acute intracranial abnormality. Electronically Signed   By: Davina Poke D.O.   On:  09/18/2022 10:45    Procedures .Critical Care  Performed by: Sherwood Gambler, MD Authorized by: Sherwood Gambler, MD   Critical care provider statement:    Critical care time (minutes):  45   Critical care time was exclusive of:  Separately billable procedures and treating other patients   Critical care was necessary to treat or prevent imminent or life-threatening deterioration of the following conditions:  CNS failure or compromise   Critical care was time spent personally by me on the following activities:  Development of treatment plan with patient or surrogate, discussions with consultants, evaluation of patient's response to treatment, examination of patient, ordering and review of laboratory studies, ordering and review of radiographic studies, ordering and performing treatments and interventions, pulse oximetry, re-evaluation of patient's condition and review of old charts  Medications Ordered in ED Medications  labetalol (NORMODYNE) injection 10 mg (has no administration in time range)  lactated ringers bolus 1,000 mL (has no administration in time range)  ceFEPIme (MAXIPIME) 2 g in sodium chloride 0.9 % 100 mL IVPB (has no administration in time range)  metroNIDAZOLE (FLAGYL) IVPB 500 mg (has no administration in time range)  vancomycin (VANCOCIN) IVPB 1000 mg/200 mL premix (has no administration in time range)    ED Course/ Medical Decision Making/ A&P Clinical Course as of 09/18/22 1057  Thu Sep 18, 2022  1026 I was informed by staff that patient's rectal temp is 93. Bair hugger applied. Will obtain cultures and with her BP of 240/130 will get CT to take her next to rule out head bleed [SG]  1037 In the CT scanner I didn't see an obvious/significant head bleed. Could be Hypertensive Encephalopathy. Will give labetalol, and with the hypothermia will also give antibiotics given unclear presentation. Initial MAP 162, goal will be ~130 (20%). [SG]    Clinical Course User  Index [SG] Sherwood Gambler, MD                           Medical Decision Making Amount and/or Complexity of Data Reviewed Independent Historian: EMS External Data Reviewed: notes. Labs: ordered.    Details: Cr near baseline.  No hyperkalemia.  Chronic anemia but normal WBC. Radiology: ordered and independent interpretation performed.    Details: Head CT - no head bleed ECG/medicine tests: ordered and independent interpretation performed.    Details: No acute ischemia  Risk Prescription drug management. Decision regarding hospitalization.   Patient presents with altered mental state.  She is protecting her airway but clearly altered.  No hypoglycemia to account for symptoms.  As above, CT head unremarkable.  Started on labetalol and after 1 dose her blood pressure came down adequately and was about a 20% decrease from her presenting hypertension.  She also is more awake and while she still confused, she is now following commands though not talking to me.  Seems more awake.  However blood pressure started going back up.  I discussed with ICU, Dr. Tamala Julian, will put on Cleviprex.  Will order MRI for eval of PRES.   I did also discussed with neurology, who agrees with blood pressure control and MRI but otherwise does not have much more input currently.  She is hypothermic, unclear why.  Normal WBC and lactate.  Not sure this is really infectious in etiology.  She was given IV antibiotics but I think sepsis at this point is less likely to be causing her symptoms.  Admit to ICU.        Final Clinical Impression(s) / ED Diagnoses Final diagnoses:  Hypertensive encephalopathy    Rx / DC Orders ED Discharge Orders     None         Sherwood Gambler, MD 09/18/22 1302

## 2022-09-19 ENCOUNTER — Encounter (HOSPITAL_COMMUNITY): Payer: Self-pay | Admitting: Internal Medicine

## 2022-09-19 ENCOUNTER — Inpatient Hospital Stay (HOSPITAL_COMMUNITY): Payer: Medicare Other

## 2022-09-19 ENCOUNTER — Other Ambulatory Visit: Payer: Self-pay

## 2022-09-19 DIAGNOSIS — I160 Hypertensive urgency: Secondary | ICD-10-CM

## 2022-09-19 DIAGNOSIS — I161 Hypertensive emergency: Secondary | ICD-10-CM | POA: Diagnosis not present

## 2022-09-19 LAB — COMPREHENSIVE METABOLIC PANEL
ALT: 12 U/L (ref 0–44)
AST: 15 U/L (ref 15–41)
Albumin: 2.4 g/dL — ABNORMAL LOW (ref 3.5–5.0)
Alkaline Phosphatase: 100 U/L (ref 38–126)
Anion gap: 8 (ref 5–15)
BUN: 25 mg/dL — ABNORMAL HIGH (ref 8–23)
CO2: 19 mmol/L — ABNORMAL LOW (ref 22–32)
Calcium: 7.9 mg/dL — ABNORMAL LOW (ref 8.9–10.3)
Chloride: 113 mmol/L — ABNORMAL HIGH (ref 98–111)
Creatinine, Ser: 2.64 mg/dL — ABNORMAL HIGH (ref 0.44–1.00)
GFR, Estimated: 19 mL/min — ABNORMAL LOW (ref >60)
Glucose, Bld: 108 mg/dL — ABNORMAL HIGH (ref 70–99)
Potassium: 2.7 mmol/L — CL (ref 3.5–5.1)
Sodium: 140 mmol/L (ref 135–145)
Total Bilirubin: 0.5 mg/dL (ref 0.3–1.2)
Total Protein: 6.2 g/dL — ABNORMAL LOW (ref 6.5–8.1)

## 2022-09-19 LAB — BASIC METABOLIC PANEL
Anion gap: 5 (ref 5–15)
BUN: 26 mg/dL — ABNORMAL HIGH (ref 8–23)
CO2: 19 mmol/L — ABNORMAL LOW (ref 22–32)
Calcium: 7.8 mg/dL — ABNORMAL LOW (ref 8.9–10.3)
Chloride: 114 mmol/L — ABNORMAL HIGH (ref 98–111)
Creatinine, Ser: 2.73 mg/dL — ABNORMAL HIGH (ref 0.44–1.00)
GFR, Estimated: 19 mL/min — ABNORMAL LOW (ref >60)
Glucose, Bld: 240 mg/dL — ABNORMAL HIGH (ref 70–99)
Potassium: 3.6 mmol/L (ref 3.5–5.1)
Sodium: 138 mmol/L (ref 135–145)

## 2022-09-19 LAB — CBC
HCT: 25 % — ABNORMAL LOW (ref 36.0–46.0)
Hemoglobin: 8.1 g/dL — ABNORMAL LOW (ref 12.0–15.0)
MCH: 29.2 pg (ref 26.0–34.0)
MCHC: 32.4 g/dL (ref 30.0–36.0)
MCV: 90.3 fL (ref 80.0–100.0)
Platelets: 222 10*3/uL (ref 150–400)
RBC: 2.77 MIL/uL — ABNORMAL LOW (ref 3.87–5.11)
RDW: 13.2 % (ref 11.5–15.5)
WBC: 5 10*3/uL (ref 4.0–10.5)
nRBC: 0 % (ref 0.0–0.2)

## 2022-09-19 LAB — GLUCOSE, CAPILLARY
Glucose-Capillary: 109 mg/dL — ABNORMAL HIGH (ref 70–99)
Glucose-Capillary: 124 mg/dL — ABNORMAL HIGH (ref 70–99)
Glucose-Capillary: 125 mg/dL — ABNORMAL HIGH (ref 70–99)
Glucose-Capillary: 188 mg/dL — ABNORMAL HIGH (ref 70–99)
Glucose-Capillary: 151 mg/dL — ABNORMAL HIGH (ref 70–99)

## 2022-09-19 MED ORDER — HYDRALAZINE HCL 50 MG PO TABS
50.0000 mg | ORAL_TABLET | Freq: Three times a day (TID) | ORAL | Status: DC
  Administered 2022-09-19 – 2022-09-21 (×5): 50 mg via ORAL
  Filled 2022-09-19 (×5): qty 1

## 2022-09-19 MED ORDER — POTASSIUM CHLORIDE 10 MEQ/100ML IV SOLN
10.0000 meq | INTRAVENOUS | Status: AC
  Administered 2022-09-19 (×4): 10 meq via INTRAVENOUS
  Filled 2022-09-19 (×4): qty 100

## 2022-09-19 MED ORDER — INSULIN ASPART 100 UNIT/ML IJ SOLN
0.0000 [IU] | Freq: Three times a day (TID) | INTRAMUSCULAR | Status: DC
  Administered 2022-09-19: 2 [IU] via SUBCUTANEOUS
  Administered 2022-09-20 (×2): 3 [IU] via SUBCUTANEOUS
  Administered 2022-09-20: 5 [IU] via SUBCUTANEOUS
  Administered 2022-09-21: 3 [IU] via SUBCUTANEOUS

## 2022-09-19 MED ORDER — ACETAMINOPHEN 325 MG PO TABS
650.0000 mg | ORAL_TABLET | Freq: Four times a day (QID) | ORAL | Status: DC | PRN
  Administered 2022-09-19 – 2022-09-20 (×2): 650 mg via ORAL
  Filled 2022-09-19 (×2): qty 2

## 2022-09-19 MED ORDER — AMLODIPINE BESYLATE 10 MG PO TABS
10.0000 mg | ORAL_TABLET | Freq: Every day | ORAL | Status: DC
  Administered 2022-09-19 – 2022-09-21 (×3): 10 mg via ORAL
  Filled 2022-09-19 (×3): qty 1

## 2022-09-19 MED ORDER — GABAPENTIN 100 MG PO CAPS
100.0000 mg | ORAL_CAPSULE | Freq: Every day | ORAL | Status: DC
  Administered 2022-09-19 – 2022-09-20 (×2): 100 mg via ORAL
  Filled 2022-09-19 (×2): qty 1

## 2022-09-19 MED ORDER — INSULIN ASPART 100 UNIT/ML IJ SOLN
0.0000 [IU] | Freq: Every day | INTRAMUSCULAR | Status: DC
  Administered 2022-09-20: 3 [IU] via SUBCUTANEOUS

## 2022-09-19 MED ORDER — HYDRALAZINE HCL 25 MG PO TABS
25.0000 mg | ORAL_TABLET | Freq: Three times a day (TID) | ORAL | Status: DC
  Administered 2022-09-19 (×2): 25 mg via ORAL
  Filled 2022-09-19 (×2): qty 1

## 2022-09-19 MED ORDER — LABETALOL HCL 5 MG/ML IV SOLN: 10.0000 mg | INTRAVENOUS | Status: DC | PRN

## 2022-09-19 MED ORDER — CARVEDILOL 25 MG PO TABS
25.0000 mg | ORAL_TABLET | Freq: Two times a day (BID) | ORAL | Status: DC
  Administered 2022-09-19 – 2022-09-21 (×5): 25 mg via ORAL
  Filled 2022-09-19 (×2): qty 1
  Filled 2022-09-19: qty 2
  Filled 2022-09-19 (×2): qty 1

## 2022-09-19 MED ORDER — ATORVASTATIN CALCIUM 40 MG PO TABS
40.0000 mg | ORAL_TABLET | Freq: Every day | ORAL | Status: DC
  Administered 2022-09-19 – 2022-09-21 (×3): 40 mg via ORAL
  Filled 2022-09-19 (×3): qty 1

## 2022-09-19 MED ORDER — LEVETIRACETAM 500 MG PO TABS
500.0000 mg | ORAL_TABLET | Freq: Two times a day (BID) | ORAL | Status: DC
  Administered 2022-09-19 – 2022-09-21 (×5): 500 mg via ORAL
  Filled 2022-09-19 (×5): qty 1

## 2022-09-19 NOTE — Progress Notes (Signed)
eLink Physician-Brief Progress Note Patient Name: Kyana Aicher DOB: Jul 17, 1956 MRN: [id number]   Date of Service  09/19/2022  HPI/Events of Note  K+ 2.7 Crt 2.64  Has PIV  No report of ongoing loss. Urine output adequate  eICU Interventions  K total of 40 meqs ordered      Intervention Category Intermediate Interventions: Electrolyte abnormality - evaluation and management  Judd Lien 09/19/2022, 5:15 AM

## 2022-09-19 NOTE — Progress Notes (Signed)
Initial Nutrition Assessment  NUTRITION DIAGNOSIS:  No nutrition diagnosis at this time. GOAL:  Patient will meet greater than or equal to 90% of their needs  MONITOR:  PO intake  REASON FOR ASSESSMENT:  Malnutrition Screening Tool   ASSESSMENT:  Pt is a 66yo F with PMH of seizures complicated by prolonged postictal period, HTN, dyslipidemia, CKD 3a, and IDDM who presents with AMS.  Visited pt at bedside this afternoon. She was finishing up lunch with 100% meal acceptance. Pt denies recent weight loss and changes in po intake. Reports usual body weight of 114# (51.8kg), current body weight 122#(55.3kg). Watches BG at home. Reprots she used to drink Ensure but stopped due to sugar content. Pt could trial Ensure Max q day (150kcal and 30g protein) if average intake <75%. But given good intake and elevated Cr, will hold ONS at this time. No physical signs of malnutrition noted. Pt has no nutrition related questions at this time.   Medications reviewed and include: novolog q 4hrs, cleviprex @ 37m/hr (384kcal from fat/day), keppra, KCl, prn colace, prn miralax  Labs reviewed: A1c (June):10.3, BG:106-146, K:2.7, BUN:25, Cr:2.64, Corrected Ca:8.8 (Ca:7.9, Albumin:2.4), GFR:19   NUTRITION - FOCUSED PHYSICAL EXAM:  Flowsheet Row Most Recent Value  Orbital Region Mild depletion  Upper Arm Region No depletion  Thoracic and Lumbar Region No depletion  Buccal Region No depletion  Temple Region Mild depletion  Clavicle Bone Region No depletion  Clavicle and Acromion Bone Region No depletion  Scapular Bone Region No depletion  Dorsal Hand No depletion  Patellar Region No depletion  Anterior Thigh Region No depletion  Posterior Calf Region No depletion  Hair Reviewed  Eyes Reviewed  Mouth Reviewed  Skin Reviewed  Nails Reviewed       Diet Order:   Diet Order             Diet Heart Room service appropriate? Yes; Fluid consistency: Thin  Diet effective now                    EDUCATION NEEDS:  No education needs have been identified at this time  Skin:  Skin Assessment: Reviewed RN Assessment  Last BM:  PTA  Height:  Ht Readings from Last 1 Encounters:  09/07/22 '5\' 8"'$  (1.727 m)   Weight:  Wt Readings from Last 1 Encounters:  09/19/22 55.3 kg   Ideal Body Weight:  63.6 kg  BMI:  Body mass index is 18.54 kg/m.  Estimated Nutritional Needs:  Kcal:  1400-1700kcal Protein:  65-85g Fluid:  1400-17061m KaCandise BowensMS, RD, LDN, CNSC See AMiON for contact information

## 2022-09-19 NOTE — Consult Note (Signed)
Renal Service Consult Note [facility]  Tricia Huynh 09/19/2022 Tricia Blazing, MD Requesting Physician: Tricia. Halford Huynh  Reason for Consult: Renal failure HPI: The patient is a 66 y.o. year-old w/ PMH as below presented w/ AMS and concern for possible seizure at home. In ED SBP 230s, was rx'd w/ IV labetalol and Cleviprex gtt. Pt admitted to SDU. Pt was here in Sept 2023, seen by renal service and serologies sent off. Tests came back + for ANA and FLC ratio, we are asked to see in consultation.    After dc Sept 2023 admit, pt seen by Tricia Huynh at [facility] and per his notes her ANA is borderline + and complements are wnl w/o joint sx's or rash so SLE unlikely. Tricia Huynh dx was CKD due to severe long-standing DM2 and HTN.  See also below.   Pt doing well today and no c/o's.   ROS - denies CP, no joint pain, no HA, no blurry vision, no rash, no diarrhea, no nausea/ vomiting, no dysuria, no difficulty voiding   Past Medical History  Past Medical History:  Diagnosis Date   Diabetes mellitus type 2 in nonobese ([place]) 06/06/2015   Diabetes mellitus without complication ([place])    Diabetic neuropathy ([place]) 06/06/2015   Dyslipidemia 06/06/2015   Encephalopathy    Essential hypertension 06/06/2015   Hypertension    Mood disorder ([place]) 06/06/2015   Pneumonia 03/11/2017   Seizure ([place])    sees Tricia Huynh. abd eeg, on keppra   Past Surgical History  Past Surgical History:  Procedure Laterality Date   ABDOMINAL HYSTERECTOMY     EYE SURGERY Bilateral    TUBAL LIGATION     Family History  Family History  Problem Relation Age of Onset   Heart disease Father    Kidney disease Father    Diabetes Mother    Seizures Sister    Seizures Brother    Social History  reports that she has quit smoking. Her smoking use included cigarettes. She has never used smokeless tobacco. She reports that she does not drink alcohol and does not use drugs. Allergies  Allergies  Allergen Reactions    Percocet [Oxycodone-Acetaminophen] Nausea And Vomiting and Other (See Comments)    Causes the patient to be shaky/unsteady, also   Vicodin [Hydrocodone-Acetaminophen] Nausea And Vomiting and Other (See Comments)    Causes the patient to be shaky/unsteady, also   Home medications Prior to Admission medications   Medication Sig Start Date End Date Taking? Authorizing Provider  amitriptyline (ELAVIL) 50 MG tablet Take 50 mg by mouth daily. 05/08/15  Yes [provider]  amLODipine (NORVASC) 10 MG tablet Take 1 tablet (10 mg total) by mouth daily. 07/29/22  Yes Danford, Suann Larry, MD  atorvastatin (LIPITOR) 40 MG tablet Take 40 mg by mouth daily. 12/23/19  Yes [provider]  carvedilol (COREG) 25 MG tablet Take 1 tablet (25 mg total) by mouth 2 (two) times daily with a meal. 07/28/22  Yes Danford, Suann Larry, MD  ferrous sulfate 325 (65 FE) MG tablet Take 1 tablet (325 mg total) by mouth daily with breakfast. 05/02/22  Yes Sheikh, Omair Latif, DO  gabapentin (NEURONTIN) 100 MG capsule Take 1 capsule (100 mg total) by mouth at bedtime. 07/28/22  Yes Danford, Suann Larry, MD  hydrALAZINE (APRESOLINE) 25 MG tablet Take 1 tablet (25 mg total) by mouth every 8 (eight) hours. 07/28/22  Yes Danford, Suann Larry, MD  insulin glargine (LANTUS SOLOSTAR) 100 UNIT/ML Solostar Pen  Inject 5 Units into the skin daily. Discard pen after 28 days from first use 07/28/22  Yes Danford, Suann Larry, MD  Multiple Vitamin (MULTIVITAMIN WITH MINERALS) TABS tablet Take 1 tablet by mouth daily. 05/02/22  Yes Sheikh, Omair Latif, DO  Insulin Pen Needle (TECHLITE PEN NEEDLES) 32G X 4 MM MISC Use to inject lantus once a day 07/28/22   Tricia Dada, MD     Vitals:   09/19/22 1430 09/19/22 1445 09/19/22 1500 09/19/22 1515  BP: (!) 177/94 (!) 172/91 (!) 173/94 (!) 175/90  Pulse: 76 79 80 77  Resp: _0 Temp:      TempSrc:      SpO2: 100% 99% 98% 97%  Weight:       Exam Gen alert,  no distress No rash, cyanosis or gangrene Sclera anicteric, throat clear  No jvd or bruits Chest clear bilat to bases, no rales/ wheezing RRR no MRG Abd soft ntnd no mass or ascites +bs GU defer MS no joint effusions or deformity Ext no LE or UE edema, no wounds or ulcers Neuro is alert, Ox 3 , nf      Home meds include - elavil, norvasc, lipitor, coreg, neurtontin, hydralazine, insulin glargine, MVI, prns/ vits    Date  Creat  eGFR   2016  0.85- 1.32   2017  0.87- 1.72   2018  1.19- 1.64   2019  0.90- 1.30   2020  1.10- 1.48   2021  1.08- 1.84   2022  1.08- 1.45   Jan 2023 1.25     April  1.37   June  1.63- 1.97   Sept  2.19- 2.77 18- 24 ml/min   09/07/22 2.24  24    11/02  2.51    11/03  2.64  19     UA 11/2  - clear , prot >300, 0-5 rbc/ wbc    CXR 11/02- looks negative for edema   UP/C ratio = 6 gm  Assessment/ Plan: CKD IV - b/l creat 2.24 from Oct 2023, eGFR 24 ml/min. Creat here slightly up at 2.6 in setting of HTN'sive urgency w/ possible seizures. Pt seen here in Sept and f/u visit at [facility] w/ Tricia Huynh. CKD felt to be due to poorly cont DM2 and HTN. ANA borderline and complements neg, no rash or joint pain. Lupus not likely. Had minimally elevated LC ratio w/ neg SPEP, not c/w myeloma. Her A1C was as high as 14 in the past, DM x 20 yrs. Also BP hard to control. Recommend no further diagnostic investigation.  HTN'sive urgency - not sig vol overloaded on exam. BP's better but still high, on po norvasc, hydralazine 25 tid and coreg 25 bid. Will ^hydralazine 50 tid.  AMS/ question seizure - on Keppra DM2 - x 20 yrs, on insulin      Tricia Dietrich Ke  MD 09/19/2022, 3:32 PM Recent Labs  Lab 09/18/22 1030 09/19/22 0250  HGB 9.3* 8.1*  ALBUMIN 3.0* 2.4*  CALCIUM 8.6* 7.9*  CREATININE 2.51* 2.64*  K 3.9 2.7*   Inpatient medications:  amLODipine  10 mg Oral Daily   atorvastatin  40 mg Oral Daily   carvedilol  25 mg Oral BID WC   Chlorhexidine Gluconate Cloth  6  each Topical Daily   gabapentin  100 mg Oral QHS   hydrALAZINE  25 mg Oral Q8H   insulin aspart  0-5 Units Subcutaneous QHS   insulin aspart  0-9 Units Subcutaneous  TID WC   levETIRAcetam  500 mg Oral BID    docusate sodium, labetalol, ondansetron (ZOFRAN) IV, mouth rinse, polyethylene glycol

## 2022-09-19 NOTE — Progress Notes (Signed)
Renal artery duplex has been completed.   Results can be found under chart review under CV PROC. 09/19/2022 11:24 AM Edvin Albus RVT, RDMS

## 2022-09-19 NOTE — Progress Notes (Addendum)
NAME:  Tricia Huynh, MRN:  [id number], DOB:  [birth year], LOS: 1 ADMISSION DATE:  09/18/2022, CONSULTATION DATE:  09/18/22 REFERRING MD:  Regenia Skeeter - EM, CHIEF COMPLAINT:  Ams    History of Present Illness:  66 yo F PMH HTN IDDM CKD3a Fe Def anemia, prior seizure presented to [facility] ED 11/2 with AMS. History is limited, there is a daughter and niece at the bedside -- daughter states the patient was "out of it" for an unknown period of time and was told to call EMS. There was one episode of incontinence. There is concern for possible seizure, of which pt has a reported history and may have had a sz in 2022 prompting ER presentation, for which she was Rx keppra but has not presented to OP neuro follow up appts.   In ED, SBP 230s. Received '10mg'$  labetalol and started on cleviprex.  It is not clear if the patient has missed any home meds or not.   PCCM consulted for admission in this setting   Pertinent  Medical History  IDDM CKD3a HTN Seizure  Tobacco use  Significant Hospital Events: Including procedures, antibiotic start and stop dates in addition to other pertinent events   11/2 ED w AMS. Possible Sz. HTN crisis. On cleviprex. Admit to ICU. MRI brain no acute abnormalities   11/3 Renal artery [place]. Adding PO antihypertensives and weaning clevi   Interim History / Subjective:  NAEO It does not seem that the STAT eeg from yesterday was completed   On 4 clevi  Mentating well this morning  In d/w pt it sounds like she is taking her home meds as prescribed, she does not recall missing any doses or running out of meds Is not taking Keppra outpt   Objective   Blood pressure (!) 149/74, pulse 78, temperature 98.5 F (36.9 C), temperature source Oral, resp. rate 14, weight 55.3 kg, SpO2 98 %.        Intake/Output Summary (Last 24 hours) at 09/19/2022 0929 Last data filed at 09/19/2022 0800 Gross per 24 hour  Intake 701.93 ml  Output 2075 ml  Net -1373.07 ml   Filed Weights    09/19/22 0445  Weight: 55.3 kg    Examination: General:  chronically ill middle aged f NAD HENT: NCAT pink mm anicteric sclera  Lungs: CTAb even unlabored  Cardiovascular: tachycardic, regular. S1s2  Abdomen: soft thin ndnt  Extremities: no acute joint deformity  Neuro: awake alert oriented x3 following commands  GU: defer  Resolved Hospital Problem list     Assessment & Plan:     Acute encephalopathy, improved  Possible seizure w hx prior seizure HA -suspect acute encephalopathy r/t hypertensive crisis. MRI w/o evidence of PRES. Possible seizure.  UDS neg. Ct H no acute abnormality. Labs not particularly c/w sepsis  -is not on Keppra outpt, states she hasn't had a sz in at least a year.  P -Adv diet after renal artery [place]  -dc order for cerebell  -seizure precautions  -cont Keppra  -HTN as below   Hypertensive emergency -difficult to control HTN. On norvasc '10mg'$ , coreg '25mg'$  BID, hydral '25mg'$  q8hr at home -initial goal first 24 hr MAP 130 based on presenting pressures  P -adding PO meds 11/3 -wean clevi to off  -if clevi off, can transfer out of ICU -slow normalization of pressures   CKD 3b Proteinuria Hypokalemia NAGMA -last admission nephro was consulted and ordered serologies -- ANA was "positive" (> 1:80), ANCA < 1:20, GMB ab <  0.2. Noted to have speckled pattern. Looks like kappa free light chain was elevated 114.9, lambda also elevated 42.3, & ratio 2.72    - ?lupus nephritis with these labs. ?LCDD ?GN P -trend renal indices, UOP  -ANA ordered   -renal artery [place] pending  -nephro consult  -pending course, wonder if there might be a role for renal bx   IDDM Diabetic neuropathy  P -hold home elavil and gabapentin with current encephalopathy -SSI   Iron deficiency anemia -PRN CBC -can restart Fe when taking POs   Best Practice (right click and "Reselect all SmartList Selections" daily)   Diet/type: NPO DVT prophylaxis: SCD GI prophylaxis:  N/A Lines: N/A Foley:  N/A Code Status:  full code Last date of multidisciplinary goals of care discussion [pt updated 11/3  Labs   CBC: Recent Labs  Lab 09/18/22 1030 09/19/22 0250  WBC 6.1 5.0  NEUTROABS 3.8  --   HGB 9.3* 8.1*  HCT 29.8* 25.0*  MCV 92.3 90.3  PLT 285 734    Basic Metabolic Panel: Recent Labs  Lab 09/18/22 1030 09/19/22 0250  NA 142 140  K 3.9 2.7*  CL 115* 113*  CO2 21* 19*  GLUCOSE 136* 108*  BUN 29* 25*  CREATININE 2.51* 2.64*  CALCIUM 8.6* 7.9*  MG 2.1  --    GFR: Estimated Creatinine Clearance: 18.3 mL/min (A) (by C-G formula based on SCr of 2.64 mg/dL (H)). Recent Labs  Lab 09/18/22 1030 09/19/22 0250  WBC 6.1 5.0  LATICACIDVEN 1.0  --     Liver Function Tests: Recent Labs  Lab 09/18/22 1030 09/19/22 0250  AST 21 15  ALT 16 12  ALKPHOS 126 100  BILITOT 0.4 0.5  PROT 7.5 6.2*  ALBUMIN 3.0* 2.4*   No results for input(s): "LIPASE", "AMYLASE" in the last 168 hours. Recent Labs  Lab 09/18/22 1030  AMMONIA 22    ABG    Component Value Date/Time   HCO3 20.7 09/18/2022 1727   TCO2 24 11/02/2019 1706   ACIDBASEDEF 3.8 (H) 09/18/2022 1727   O2SAT 85.7 09/18/2022 1727     Coagulation Profile: No results for input(s): "INR", "PROTIME" in the last 168 hours.  Cardiac Enzymes: No results for input(s): "CKTOTAL", "CKMB", "CKMBINDEX", "TROPONINI" in the last 168 hours.  HbA1C: Hemoglobin A1C  Date/Time Value Ref Range Status  12/06/2015 12:00 AM 11.8  Final   Hgb A1c MFr Bld  Date/Time Value Ref Range Status  04/27/2022 09:37 PM 10.3 (H) 4.8 - 5.6 % Final    Comment:    (NOTE) Pre diabetes:          5.7%-6.4%  Diabetes:              >6.4%  Glycemic control for   <7.0% adults with diabetes   12/16/2020 12:00 AM 9.2 (H) 4.8 - 5.6 % Final    Comment:    (NOTE) Pre diabetes:          5.7%-6.4%  Diabetes:              >6.4%  Glycemic control for   <7.0% adults with diabetes     CBG: Recent Labs  Lab  09/18/22 1628 09/18/22 1933 09/18/22 2328 09/19/22 0326 09/19/22 0736  GLUCAP 146* 106* 115* 109* 124*    CRITICAL CARE Performed by: Cristal Generous   Total critical care time: 38 minutes  Critical care time was exclusive of separately billable procedures and treating other patients. Critical care was necessary to  treat or prevent imminent or life-threatening deterioration.  Critical care was time spent personally by me on the following activities: development of treatment plan with patient and/or surrogate as well as nursing, discussions with consultants, evaluation of patient's response to treatment, examination of patient, obtaining history from patient or surrogate, ordering and performing treatments and interventions, ordering and review of laboratory studies, ordering and review of radiographic studies, pulse oximetry and re-evaluation of patient's condition.  Eliseo Gum MSN, AGACNP-BC [place] for pager 09/19/2022, 9:29 AM

## 2022-09-20 DIAGNOSIS — I161 Hypertensive emergency: Principal | ICD-10-CM

## 2022-09-20 LAB — CBC
HCT: 23.2 % — ABNORMAL LOW (ref 36.0–46.0)
Hemoglobin: 7.5 g/dL — ABNORMAL LOW (ref 12.0–15.0)
MCH: 29.4 pg (ref 26.0–34.0)
MCHC: 32.3 g/dL (ref 30.0–36.0)
MCV: 91 fL (ref 80.0–100.0)
Platelets: 206 10*3/uL (ref 150–400)
RBC: 2.55 MIL/uL — ABNORMAL LOW (ref 3.87–5.11)
RDW: 13.3 % (ref 11.5–15.5)
WBC: 5.1 10*3/uL (ref 4.0–10.5)
nRBC: 0 % (ref 0.0–0.2)

## 2022-09-20 LAB — GLUCOSE, CAPILLARY
Glucose-Capillary: 208 mg/dL — ABNORMAL HIGH (ref 70–99)
Glucose-Capillary: 290 mg/dL — ABNORMAL HIGH (ref 70–99)
Glucose-Capillary: 203 mg/dL — ABNORMAL HIGH (ref 70–99)
Glucose-Capillary: 279 mg/dL — ABNORMAL HIGH (ref 70–99)

## 2022-09-20 LAB — RENAL FUNCTION PANEL
Albumin: 2.3 g/dL — ABNORMAL LOW (ref 3.5–5.0)
Anion gap: 5 (ref 5–15)
BUN: 26 mg/dL — ABNORMAL HIGH (ref 8–23)
CO2: 20 mmol/L — ABNORMAL LOW (ref 22–32)
Calcium: 8.2 mg/dL — ABNORMAL LOW (ref 8.9–10.3)
Chloride: 113 mmol/L — ABNORMAL HIGH (ref 98–111)
Creatinine, Ser: 2.94 mg/dL — ABNORMAL HIGH (ref 0.44–1.00)
GFR, Estimated: 17 mL/min — ABNORMAL LOW (ref >60)
Glucose, Bld: 175 mg/dL — ABNORMAL HIGH (ref 70–99)
Phosphorus: 4.3 mg/dL (ref 2.5–4.6)
Potassium: 3.4 mmol/L — ABNORMAL LOW (ref 3.5–5.1)
Sodium: 138 mmol/L (ref 135–145)

## 2022-09-20 LAB — ANA W/REFLEX IF POSITIVE: Anti Nuclear Antibody (ANA): NEGATIVE

## 2022-09-20 MED ORDER — POTASSIUM CHLORIDE CRYS ER 20 MEQ PO TBCR
30.0000 meq | EXTENDED_RELEASE_TABLET | Freq: Once | ORAL | Status: AC
  Administered 2022-09-20: 30 meq via ORAL
  Filled 2022-09-20: qty 1

## 2022-09-20 NOTE — Progress Notes (Signed)
Mobility Specialist - Progress Note   09/20/22 1027  Mobility  Activity Ambulated with assistance in hallway  Level of Assistance Standby assist, set-up cues, supervision of patient - no hands on  Assistive Device Other (Comment) (Hallway Rails)  Distance Ambulated (ft) 350 ft  Range of Motion/Exercises Active  Activity Response Tolerated well  Mobility Referral Yes  $Mobility charge 1 Mobility   Pt was found in bed and agreeable to ambulate. Was a little unsteady during ambulation and at EOS returned to sit EOB. NT was notified.  Ferd Hibbs Mobility Specialist

## 2022-09-20 NOTE — Progress Notes (Signed)
[facility] Progress Note  Subjective: seen in room, no c/o  Vitals:   09/20/22 0500 09/20/22 0551 09/20/22 0658 09/20/22 1113  BP:  (!) 190/105 (!) 162/84 (!) 165/91  Pulse:  79 84 74  Resp:  20    Temp:  97.8 F (36.6 C)    TempSrc:  Oral    SpO2:  100%    Weight: 52.8 kg       Exam: Gen alert, no distress No jvd or bruits Chest clear bilat to bases RRR no MRG Abd soft ntnd no mass or ascites +bs Ext no LE or UE edema Neuro is alert, Ox 3 , nf      Home meds include - elavil, norvasc, lipitor, coreg, neurtontin, hydralazine, insulin glargine, MVI, prns/ vits     Date               Creat               eGFR   2016              0.85- 1.32   2017              0.87- 1.72   2018              1.19- 1.64   2019              0.90- 1.30   2020              1.10- 1.48   2021              1.08- 1.84   2022              1.08- 1.45   Jan 2023       1.25                    April               1.37   June              1.63- 1.97   Sept               2.19- 2.77        18- 24 ml/min   09/07/22        2.24                 24    11/02            2.51    11/03            2.64                 19      UA 11/2  - clear , prot >300, 0-5 rbc/ wbc    CXR 11/02- looks negative for edema   UP/C ratio = 6 gm   Assessment/ Plan: CKD IV - b/l creat 2.24 from Oct 2023, eGFR 24 ml/min. Creat here slightly up at 2.6 in setting of HTN'sive urgency w/ possible seizures. Pt seen here in hospital in Sept, and had f/u visit at [facility] w/ Dr Carolin Sicks in early October. CKD felt to be due to poorly cont DM2 and HTN. ANA borderline and complements neg, no rash or joint pain; lupus is not likely. Also had minimally elevated LC ratio w/ neg SPEP, not c/w myeloma. Her A1C was as high as 14 in the past, DM x 20 yrs. Also BP hard  to control. Recommend no further diagnostic investigation. All this explained to pt, no biopsy needed/ planned, pt understands and will f/u in [place] clinic Jan or Feb. New other  suggestions, will sign off.  HTN'sive urgency - not sig vol overloaded on exam. BP's better but still high, on po norvasc, hydralazine 25 tid and coreg 25 bid. Hydralazine ^'d to 50 tid. BP normal to slightly high.  AMS/ question seizure - on Keppra DM2 - x 20 yrs, on insulin     Rob Tane Biegler 09/20/2022, 11:18 AM   Recent Labs  Lab 09/18/22 1030 09/19/22 0250 09/19/22 1836 09/20/22 0428  HGB 9.3* 8.1*  --  7.5*  ALBUMIN 3.0* 2.4*  --   --   CALCIUM 8.6* 7.9* 7.8*  --   CREATININE 2.51* 2.64* 2.73*  --   K 3.9 2.7* 3.6  --    No results for input(s): "IRON", "TIBC", "FERRITIN" in the last 168 hours. Inpatient medications:  amLODipine  10 mg Oral Daily   atorvastatin  40 mg Oral Daily   carvedilol  25 mg Oral BID WC   gabapentin  100 mg Oral QHS   hydrALAZINE  50 mg Oral Q8H   insulin aspart  0-5 Units Subcutaneous QHS   insulin aspart  0-9 Units Subcutaneous TID WC   levETIRAcetam  500 mg Oral BID    acetaminophen, docusate sodium, labetalol, ondansetron (ZOFRAN) IV, mouth rinse, polyethylene glycol

## 2022-09-20 NOTE — Discharge Summary (Signed)
Physician Discharge Summary  Tricia Huynh NFA:[id number] DOB: Jun 11, 1956 DOA: 09/18/2022  PCP: Jonathon Jordan, MD  Admit date: 09/18/2022 Discharge date: 09/21/2022 Recommendations for Outpatient Follow-up:  Follow up with PCP/nephrology  in 1 weeks-call for appointment Please obtain BMP/CBC in one week  Discharge Dispo: Home Discharge Condition: Stable Code Status:   Code Status: Full Code Diet recommendation:  Diet Order             Diet Heart Room service appropriate? Yes; Fluid consistency: Thin  Diet effective now                    Brief/Interim Summary:  66 year old female with hypertension insulin-dependent diabetes CKD stage IIIa iron deficiency anemia previous seizure admitted with working diagnosis of hypertension emergency with acute encephalopathy possible seizure.  Patient presented to the ED with altered mental status in 11/2, daughter reported patient was "out of it" for unknown.  After time and EMS was called, there is concern for possible seizure, had an episode of incontinence. The ED sitting blood pressure 230s.  Labs with elevated creatinine 2.5 bicarb 21 normal CRP lactic acid anemia hemoglobin 9.3 g TSH 2.4 EV with proteinuria urine drug screen negative alcohol level less than 10 underwent chest x-ray-no acute finding CT head-no acute finding blood culture, admitted to ICU for further work-up with MRI brain-no acute finding. Managed with cerebral, blood pressure difficult to control nephrology was consulted.  Per nephrology no further work-up, will follow-up with her outpatient nephrology, blood pressure has been controlled after adjusting medication monitored in the medical floor 24 hours CBC stable for discharge home today Discharge Diagnoses:  Principal Problem:   Hypertensive emergency Active Problems:   Seizure ([place])   Encephalopathy acute  Hypertensive emergency: Appreciate nephrology input on amlodipine 10 Coreg 25 hydralazine 50 mg every  adjusting meds.  Blood pressure now well controlled continue current regimen and follow-up with nephrology as outpatient   Acute encephalopathy Possible seizure with previous history of seizure: Headache: Mental status at baseline. acute encephalopathy likely in the setting of hypertensive crisis.  MRI no evidence of PR ES UDS negative CT head no acute finding, no evidence of infection.  She has been on Keppra, continue the same supportive care fall precaution blood pressure management   CKD stage IIIb with proteinuria [place] metabolic acidosis: B/l creatinine 2.24 from October 23 EGFR 24.  Creatinine fluctuating in high 2s> Had extensive work-up as outpatient previously done, nephrology has signed off and okay for discharge-no further work-up Recent Labs  Lab 09/18/22 1030 09/19/22 0250 09/19/22 1836 09/20/22 0428 09/21/22 0335  BUN 29* 25* 26* 26* 35*  CREATININE 2.51* 2.64* 2.73* 2.94* 2.93*    Hypokalemia: Resolved   Insulin-dependent diabetes mellitus with neuropathy: at home on Lantus 5 units daily.  Moderate control resume home meds Recent Labs  Lab 09/20/22 0745 09/20/22 1139 09/20/22 1703 09/20/22 2028 09/21/22 0747  GLUCAP 208* 290* 203* 279* 233*     HLD-statin   Iron deficiency anemia: Baseline hemoglobin 7.9-8.7 in September and October.  Needs close outpatient follow-up start iron supplement Recent Labs  Lab 09/18/22 1030 09/19/22 0250 09/20/22 0428 09/21/22 0335  HGB 9.3* 8.1* 7.5* 7.5*  HCT 29.8* 25.0* 23.2* 23.4*    Low BMI at 17, dietitian consulted   Consults: PCCM, NEPHROLOGY Subjective: Resting comfortably, feels stable for discharge home today unable to reach her granddaughter but she reports she has contacted her  Discharge Exam: Vitals:   09/20/22 2029 09/21/22 0439  BP: (!) 171/97 (!) 147/90  Pulse: 77 75  Resp: 18 18  Temp: 98.7 F (37.1 C) 98.3 F (36.8 C)  SpO2: 100% 99%   General: Pt is alert, awake, not in acute  distress Cardiovascular: RRR, S1/S2 +, no rubs, no gallops Respiratory: CTA bilaterally, no wheezing, no rhonchi Abdominal: Soft, NT, ND, bowel sounds + Extremities: no edema, no cyanosis  Discharge Instructions  Discharge Instructions     Discharge instructions   Complete by: As directed    Check you BLOOD PRESSURE daily at home  Please call call MD or return to ER for similar or worsening recurring problem that brought you to hospital or if any fever,nausea/vomiting,abdominal pain, uncontrolled pain, chest pain,  shortness of breath or any other alarming symptoms.  Please follow-up your doctor as instructed in a week time and call the office for appointment.  Please avoid alcohol, smoking, or any other illicit substance and maintain healthy habits including taking your regular medications as prescribed.  You were cared for by a hospitalist during your hospital stay. If you have any questions about your discharge medications or the care you received while you were in the hospital after you are discharged, you can call the unit and ask to speak with the hospitalist on call if the hospitalist that took care of you is not available.  Once you are discharged, your primary care physician will handle any further medical issues. Please note that NO REFILLS for any discharge medications will be authorized once you are discharged, as it is imperative that you return to your primary care physician (or establish a relationship with a primary care physician if you do not have one) for your aftercare needs so that they can reassess your need for medications and monitor your lab values   Increase activity slowly   Complete by: As directed       Allergies as of 09/21/2022       Reactions   Percocet [oxycodone-acetaminophen] Nausea And Vomiting, Other (See Comments)   Causes the patient to be shaky/unsteady, also   Vicodin [hydrocodone-acetaminophen] Nausea And Vomiting, Other (See Comments)    Causes the patient to be shaky/unsteady, also        Medication List     STOP taking these medications    amitriptyline 50 MG tablet Commonly known as: ELAVIL       TAKE these medications    amLODipine 10 MG tablet Commonly known as: NORVASC Take 1 tablet (10 mg total) by mouth daily.   atorvastatin 40 MG tablet Commonly known as: LIPITOR Take 40 mg by mouth daily. Notes to patient: 09/22/2022    carvedilol 25 MG tablet Commonly known as: COREG Take 1 tablet (25 mg total) by mouth 2 (two) times daily with a meal.   ferrous sulfate 325 (65 FE) MG tablet Take 1 tablet (325 mg total) by mouth daily with breakfast. Notes to patient: 09/22/2022    FreeStyle Libre 3 Sensor Misc 1 each by Does not apply route 2 (two) times a week. Place 1 sensor on the skin every 14 days. Use to check glucose continuously Start taking on: September 22, 2022   gabapentin 100 MG capsule Commonly known as: NEURONTIN Take 1 capsule (100 mg total) by mouth at bedtime. Notes to patient: 09/21/2022 bedtime    hydrALAZINE 50 MG tablet Commonly known as: APRESOLINE Take 1 tablet (50 mg total) by mouth every 8 (eight) hours. What changed:  medication strength how much to take  Lantus SoloStar 100 UNIT/ML Solostar Pen Generic drug: insulin glargine Inject 5 Units into the skin daily. Discard pen after 28 days from first use Notes to patient: 09/21/2022    levETIRAcetam 500 MG tablet Commonly known as: KEPPRA Take 1 tablet (500 mg total) by mouth 2 (two) times daily.   multivitamin with minerals Tabs tablet Take 1 tablet by mouth daily. Notes to patient: 09/22/2022    TechLite Pen Needles 32G X 4 MM Misc Generic drug: Insulin Pen Needle Use to inject lantus once a day        Follow-up Information     Jonathon Jordan, MD Follow up in 1 week(s).   Specialty: Family Medicine Contact information: [place] [address] [phone]          Rosita Fire, MD Follow up in 1 week(s).   Specialties: Nephrology, Internal Medicine Contact information: [address] [place] [address] [phone]                Allergies  Allergen Reactions   Percocet [Oxycodone-Acetaminophen] Nausea And Vomiting and Other (See Comments)    Causes the patient to be shaky/unsteady, also   Vicodin [Hydrocodone-Acetaminophen] Nausea And Vomiting and Other (See Comments)    Causes the patient to be shaky/unsteady, also    The results of significant diagnostics from this hospitalization (including imaging, microbiology, ancillary and laboratory) are listed below for reference.    Microbiology: Recent Results (from the past 240 hour(s))  Culture, blood (routine x 2)     Status: None (Preliminary result)   Collection Time: 09/18/22 10:53 AM   Specimen: BLOOD RIGHT HAND  Result Value Ref Range Status   Specimen Description   Final    BLOOD RIGHT HAND Performed at [facility] [address], [place], [place] [address]    Special Requests   Final    BOTTLES DRAWN AEROBIC AND ANAEROBIC Blood Culture results may not be optimal due to an inadequate volume of blood received in culture bottles Performed at [facility] [address], [place], [place] [address]    Culture   Final    NO GROWTH 3 DAYS Performed at [facility], [place] [address], [place], [place] [address]    Report Status PENDING  Incomplete  Culture, blood (routine x 2)     Status: None (Preliminary result)   Collection Time: 09/18/22 10:54 AM   Specimen: BLOOD  Result Value Ref Range Status   Specimen Description   Final    BLOOD BLOOD LEFT FOREARM Performed at [facility] [address] [place], [place] [address]    Special Requests   Final    BOTTLES DRAWN AEROBIC AND ANAEROBIC Blood Culture adequate volume Performed at [facility] [address] [place], [place]  [address]    Culture   Final    NO GROWTH 3 DAYS Performed at [facility], [place] [address] [place], [place] [address]    Report Status PENDING  Incomplete    Procedures/Studies: VAS US RENAL ARTERY DUPLEX  Result Date: 09/20/2022 ABDOMINAL VISCERAL Patient Name:  Tricia Huynh  Date of Exam:   09/19/2022 Medical Rec #: [id number]               Accession #:    [id number] Date of Birth: June 09, 1956               Patient Gender: F Patient Age:  66 years Exam Location:  [facility] Procedure:      VAS US RENAL ARTERY DUPLEX Referring Phys: [id number] GRACE E BOWSER -------------------------------------------------------------------------------- Indications: HTN High Risk Factors: Hypertension, Diabetes, past history of smoking. Other Factors: CKD, CHF. Limitations: Rib shadowing. Performing Technologist: Rogelia Rohrer RVT, RDMS  Examination Guidelines: A complete evaluation includes B-mode imaging, spectral Doppler, color Doppler, and power Doppler as needed of all accessible portions of each vessel. Bilateral testing is considered an integral part of a complete examination. Limited examinations for reoccurring indications may be performed as noted.  Duplex Findings: +--------------------+--------+--------+------+--------+ Mesenteric          PSV cm/sEDV cm/sPlaqueComments +--------------------+--------+--------+------+--------+ Aorta Prox             74      11                  +--------------------+--------+--------+------+--------+ Celiac Artery Origin  145                          +--------------------+--------+--------+------+--------+ SMA Proximal          172      33                  +--------------------+--------+--------+------+--------+    +------------------+--------+--------+-------+ Right Renal ArteryPSV cm/sEDV cm/sComment +------------------+--------+--------+-------+ Origin               68      16            +------------------+--------+--------+-------+ Proximal             65      16           +------------------+--------+--------+-------+ Mid                  66      16           +------------------+--------+--------+-------+ Distal               63      13           +------------------+--------+--------+-------+ +-----------------+--------+--------+-------+ Left Renal ArteryPSV cm/sEDV cm/sComment +-----------------+--------+--------+-------+ Origin              70      11           +-----------------+--------+--------+-------+ Proximal            73      12           +-----------------+--------+--------+-------+ Mid                 55      16           +-----------------+--------+--------+-------+ Distal              63      13           +-----------------+--------+--------+-------+ +------------+--------+--------+----+-----------+--------+--------+----+ Right KidneyPSV cm/sEDV cm/sRI  Left KidneyPSV cm/sEDV cm/sRI   +------------+--------+--------+----+-----------+--------+--------+----+ Upper Pole  23      6       0.76Upper Pole                      +------------+--------+--------+----+-----------+--------+--------+----+ Mid         23      6       0.74Mid        25      6       0.76 +------------+--------+--------+----+-----------+--------+--------+----+ Lower Pole  26      6       0.76Lower Pole 20      4       0.79 +------------+--------+--------+----+-----------+--------+--------+----+ Hilar       22      6       0.74Hilar      45      15      0.67 +------------+--------+--------+----+-----------+--------+--------+----+ +------------------+----+------------------+----+ Right Kidney          Left Kidney            +------------------+----+------------------+----+ RAR                   RAR                    +------------------+----+------------------+----+ RAR (manual)      0.92RAR (manual)      0.99  +------------------+----+------------------+----+ Cortex            10/4Cortex            13/6 +------------------+----+------------------+----+ Cortex thickness      Corex thickness        +------------------+----+------------------+----+ Kidney length (cm)9.56Kidney length (cm)9.13 +------------------+----+------------------+----+  Summary: Renal:  Right: No evidence of right renal artery stenosis. Abnormal right        Resistive Index. RRV flow present. Normal size right kidney.        Cyst(s) noted. Left:  No evidence of left renal artery stenosis. Abnormal left        Resisitve Index. LRV flow present. Normal size of left        kidney. Cyst(s) noted. Mesenteric: Normal Celiac artery and Superior Mesenteric artery findings. Areas of limited visceral study include left kidney size (due to rib shadowing).  *See table(s) above for measurements and observations.  Diagnosing physician: Orlie Pollen  Electronically signed by Orlie Pollen on 09/20/2022 at 1:24:52 PM.    Final    MR BRAIN WO CONTRAST  Result Date: 09/18/2022 CLINICAL DATA:  Mental status change, unknown cause EXAM: MRI HEAD WITHOUT CONTRAST TECHNIQUE: Multiplanar, multiecho pulse sequences of the brain and surrounding structures were obtained without intravenous contrast. COMPARISON:  CT head from the same day.  MRI head April 27, 2022. FINDINGS: Brain: No acute infarction, hemorrhage, hydrocephalus, extra-axial collection or mass lesion. Vascular: Major vertebral flow voids are maintained at the skull base. Skull and upper cervical spine: Normal marrow signal. Sinuses/Orbits: Mild paranasal sinus mucosal thickening. No acute orbital findings. Other: No mastoid effusions. IMPRESSION: No evidence of acute intracranial abnormality. Electronically Signed   By: Margaretha Sheffield M.D.   On: 09/18/2022 13:55   DG Chest Port 1 View  Result Date: 09/18/2022 CLINICAL DATA:  AMS EXAM: PORTABLE CHEST 1 VIEW COMPARISON:  Chest x-ray April 27, 2022. FINDINGS: Enlarged cardiac silhouette. Pulmonary vascular congestion. No consolidation. No visible pleural effusions or pneumothorax. No evidence of acute osseous abnormality. IMPRESSION: Cardiomegaly and pulmonary vascular congestion without overt pulmonary edema. Electronically Signed   By: Margaretha Sheffield M.D.   On: 09/18/2022 11:22   CT HEAD WO CONTRAST  Result Date: 09/18/2022 CLINICAL DATA:  Mental status change, unknown cause EXAM: CT HEAD WITHOUT CONTRAST TECHNIQUE: Contiguous axial images were obtained from the base of the skull through the vertex without intravenous contrast. RADIATION DOSE REDUCTION: This exam was performed according to the departmental dose-optimization program which includes automated exposure control, adjustment of the mA and/or kV according to patient size and/or use of iterative reconstruction technique. COMPARISON:  07/23/2022 FINDINGS:  Brain: No evidence of acute infarction, hemorrhage, hydrocephalus, extra-axial collection or mass lesion/mass effect. Vascular: No hyperdense vessel or unexpected calcification. Skull: Normal. Negative for fracture or focal lesion. Sinuses/Orbits: No acute finding. Other: None. IMPRESSION: No acute intracranial abnormality. Electronically Signed   By: Davina Poke D.O.   On: 09/18/2022 10:45    Labs: BNP (last 3 results) No results for input(s): "BNP" in the last 8760 hours. Basic Metabolic Panel: Recent Labs  Lab 09/18/22 1030 09/19/22 0250 09/19/22 1836 09/20/22 0428 09/21/22 0335  NA 142 140 138 138 137  K 3.9 2.7* 3.6 3.4* 4.2  CL 115* 113* 114* 113* 111  CO2 21* 19* 19* 20* 21*  GLUCOSE 136* 108* 240* 175* 232*  BUN 29* 25* 26* 26* 35*  CREATININE 2.51* 2.64* 2.73* 2.94* 2.93*  CALCIUM 8.6* 7.9* 7.8* 8.2* 8.5*  MG 2.1  --   --   --   --   PHOS  --   --   --  4.3  --    Liver Function Tests: Recent Labs  Lab 09/18/22 1030 09/19/22 0250 09/20/22 0428  AST 21 15  --   ALT 16 12  --   ALKPHOS 126 100   --   BILITOT 0.4 0.5  --   PROT 7.5 6.2*  --   ALBUMIN 3.0* 2.4* 2.3*   No results for input(s): "LIPASE", "AMYLASE" in the last 168 hours. Recent Labs  Lab 09/18/22 1030  AMMONIA 22   CBC: Recent Labs  Lab 09/18/22 1030 09/19/22 0250 09/20/22 0428 09/21/22 0335  WBC 6.1 5.0 5.1 5.3  NEUTROABS 3.8  --   --   --   HGB 9.3* 8.1* 7.5* 7.5*  HCT 29.8* 25.0* 23.2* 23.4*  MCV 92.3 90.3 91.0 91.1  PLT 285 222 206 221   Cardiac Enzymes: No results for input(s): "CKTOTAL", "CKMB", "CKMBINDEX", "TROPONINI" in the last 168 hours. BNP: Invalid input(s): "POCBNP" CBG: Recent Labs  Lab 09/20/22 0745 09/20/22 1139 09/20/22 1703 09/20/22 2028 09/21/22 0747  GLUCAP 208* 290* 203* 279* 233*   D-Dimer No results for input(s): "DDIMER" in the last 72 hours. Hgb A1c No results for input(s): "HGBA1C" in the last 72 hours. Lipid Profile No results for input(s): "CHOL", "HDL", "LDLCALC", "TRIG", "CHOLHDL", "LDLDIRECT" in the last 72 hours. Thyroid function studies No results for input(s): "TSH", "T4TOTAL", "T3FREE", "THYROIDAB" in the last 72 hours.  Invalid input(s): "FREET3"  Anemia work up No results for input(s): "VITAMINB12", "FOLATE", "FERRITIN", "TIBC", "IRON", "RETICCTPCT" in the last 72 hours. Urinalysis    Component Value Date/Time   COLORURINE STRAW (A) 09/18/2022 1043   APPEARANCEUR CLEAR 09/18/2022 1043   LABSPEC 1.009 09/18/2022 1043   PHURINE 6.0 09/18/2022 1043   GLUCOSEU 50 (A) 09/18/2022 1043   HGBUR NEGATIVE 09/18/2022 1043   BILIRUBINUR NEGATIVE 09/18/2022 1043   KETONESUR NEGATIVE 09/18/2022 1043   PROTEINUR >=300 (A) 09/18/2022 1043   UROBILINOGEN 0.2 09/18/2015 1327   NITRITE NEGATIVE 09/18/2022 1043   LEUKOCYTESUR NEGATIVE 09/18/2022 1043   Sepsis Labs Recent Labs  Lab 09/18/22 1030 09/19/22 0250 09/20/22 0428 09/21/22 0335  WBC 6.1 5.0 5.1 5.3   Microbiology Recent Results (from the past 240 hour(s))  Culture, blood (routine x 2)      Status: None (Preliminary result)   Collection Time: 09/18/22 10:53 AM   Specimen: BLOOD RIGHT HAND  Result Value Ref Range Status   Specimen Description   Final    BLOOD RIGHT HAND Performed at [facility], [place] [address] [place], [place] [address]    Special Requests   Final    BOTTLES DRAWN AEROBIC AND ANAEROBIC Blood Culture results may not be optimal due to an inadequate volume of blood received in culture bottles Performed at [facility] [address] [place], [place] [address]    Culture   Final    NO GROWTH 3 DAYS Performed at [facility], [place] [address], [place], [place] [address]    Report Status PENDING  Incomplete  Culture, blood (routine x 2)     Status: None (Preliminary result)   Collection Time: 09/18/22 10:54 AM   Specimen: BLOOD  Result Value Ref Range Status   Specimen Description   Final    BLOOD BLOOD LEFT FOREARM Performed at [facility] [address] [place], [place] [address]    Special Requests   Final    BOTTLES DRAWN AEROBIC AND ANAEROBIC Blood Culture adequate volume Performed at [facility] [address], [place], [place] [address]    Culture   Final    NO GROWTH 3 DAYS Performed at [facility], [place] [address], [place], [place] [address]    Report Status PENDING  Incomplete   Time coordinating discharge: 25 minutes  SIGNED: Antonieta Pert, MD  [facility] 09/21/2022, 11:17 AM  If 7PM-7AM, please contact night-coverage [email or web address]

## 2022-09-20 NOTE — Progress Notes (Signed)
Pt's Hgb dropped from 8.1 to 7.5, MD notified. Pt denied dizziness. Pt report have "a little bit" of blood on the tissue after she urinated.

## 2022-09-20 NOTE — Evaluation (Signed)
Physical Therapy Evaluation-1x Patient Details Name: Tricia Huynh MRN: [id number] DOB: Apr 13, 1956 Today's Date: 09/20/2022  History of Present Illness  66 yo female admitted with hypertensive emergency. Hx of DM, CKD  Clinical Impression  On eval, pt was Mod Ind with mobility. She walked ~450 feet around the unit without a device. Mildly unsteady intermittently but no overt LOB. Pt tolerated distance well. No acute PT needs. 1x eval. Will sign off.        Recommendations for follow up therapy are one component of a multi-disciplinary discharge planning process, led by the attending physician.  Recommendations may be updated based on patient status, additional functional criteria and insurance authorization.  Follow Up Recommendations No PT follow up      Assistance Recommended at Discharge PRN  Patient can return home with the following  Assist for transportation    Equipment Recommendations None recommended by PT  Recommendations for Other Services       Functional Status Assessment Patient has had a recent decline in their functional status and demonstrates the ability to make significant improvements in function in a reasonable and predictable amount of time.     Precautions / Restrictions Precautions Precautions: Fall Restrictions Weight Bearing Restrictions: No      Mobility  Bed Mobility Overal bed mobility: Independent                  Transfers Overall transfer level: Independent                      Ambulation/Gait Ambulation/Gait assistance: Modified independent (Device/Increase time) Gait Distance (Feet): 450 Feet Assistive device: None Gait Pattern/deviations: Drifts right/left       General Gait Details: intermittent use of hallway handrail. intermttent mild drifting  Stairs            Wheelchair Mobility    Modified Rankin (Stroke Patients Only)       Balance Overall balance assessment: Mild deficits  observed, not formally tested                                           Pertinent Vitals/Pain Pain Assessment Pain Assessment: No/denies pain    Home Living Family/patient expects to be discharged to:: Private residence Living Arrangements: Other relatives Available Help at Discharge: Family;Available 24 hours/day Type of Home: House Home Access: Stairs to enter Entrance Stairs-Rails: Right;Left;Can reach both Entrance Stairs-Number of Steps: 5   Home Layout: One level Home Equipment: None Additional Comments: DME (RW and rollator) have both been recommended but pt stated she does not have equipment at home    Prior Function Prior Level of Function : Needs assist             Mobility Comments: independent-sometimes holds on to walls/furniture ADLs Comments: pt reported she does not need any ADL help. she doesn't drive     Hand Dominance        Extremity/Trunk Assessment   Upper Extremity Assessment Upper Extremity Assessment: Overall WFL for tasks assessed    Lower Extremity Assessment Lower Extremity Assessment: Overall WFL for tasks assessed    Cervical / Trunk Assessment Cervical / Trunk Assessment: Normal  Communication   Communication: No difficulties  Cognition Arousal/Alertness: Awake/alert Behavior During Therapy: WFL for tasks assessed/performed Overall Cognitive Status: Within Functional Limits for tasks assessed  General Comments      Exercises     Assessment/Plan    PT Assessment Patient does not need any further PT services  PT Problem List         PT Treatment Interventions      PT Goals (Current goals can be found in the Care Plan section)  Acute Rehab PT Goals Patient Stated Goal: none stated PT Goal Formulation: All assessment and education complete, DC therapy    Frequency       Co-evaluation               AM-PAC PT "6 Clicks" Mobility   Outcome Measure Help needed turning from your back to your side while in a flat bed without using bedrails?: None Help needed moving from lying on your back to sitting on the side of a flat bed without using bedrails?: None Help needed moving to and from a bed to a chair (including a wheelchair)?: None Help needed standing up from a chair using your arms (e.g., wheelchair or bedside chair)?: None Help needed to walk in hospital room?: None Help needed climbing 3-5 steps with a railing? : None 6 Click Score: 24    End of Session Equipment Utilized During Treatment: Gait belt Activity Tolerance: Patient tolerated treatment well Patient left: in bed;with call bell/phone within reach        Time: 1440-1455 PT Time Calculation (min) (ACUTE ONLY): 15 min   Charges:   PT Evaluation $PT Eval Low Complexity: [place], PT Acute Rehabilitation  Office: [phone]

## 2022-09-20 NOTE — Progress Notes (Signed)
PROGRESS NOTE Tricia Huynh  MRN:[id number] DOB: 04/06/1956 DOA: 09/18/2022 PCP: Wolters, Sharon, MD   Brief Narrative/Hospital Course:  66-year-old female with hypertension insulin-dependent diabetes CKD stage IIIa iron deficiency anemia previous seizure admitted with working diagnosis of hypertension emergency with acute encephalopathy possible seizure.  Patient presented to the ED with altered mental status in 11/2, daughter reported patient was "out of it" for unknown.  After time and EMS was called, there is concern for possible seizure, had an episode of incontinence. The ED sitting blood pressure 230s.  Labs with elevated creatinine 2.5 bicarb 21 normal CRP lactic acid anemia hemoglobin 9.3 g TSH 2.4 EV with proteinuria urine drug screen negative alcohol level less than 10 underwent chest x-ray-no acute finding CT head-no acute finding blood culture, admitted to ICU for further work-up with MRI brain-no acute finding. Managed with cerebral, blood pressure difficult to control nephrology was consulted.    Subjective: Seen and examined alert awake resting comfortably on the bedside chair. No complaints today Assessment and Plan: Principal Problem:   Hypertensive emergency Active Problems:   Seizure (HCC)   Encephalopathy acute   Hypertensive emergency: Appreciate nephrology input on amlodipine 10 Coreg 25 hydralazine 50 mg every adjusting meds.  Remains borderline controlled> left heart resting meds.  Monitor  Acute encephalopathy Possible seizure with previous history of seizure: Headache: Mental status improved alert awake pleasant conversant.Acute encephalopathy likely in the setting of hypertensive crisis.  MRI no evidence of PR ES UDS negative CT head no acute finding, no evidence of infection.  She has been on Keppra, continue the same supportive care fall precaution blood pressure management  CKD stage IIIb with proteinuria [place] metabolic acidosis: B/l creatinine  2.24 from October 23 EGFR 24.  Creatinine fluctuating at 2.7 extensive work-up as outpatient previously done, nephrology following closely-no further work-up Recent Labs  Lab 09/18/22 1030 09/19/22 0250 09/19/22 1836  BUN 29* 25* 26*  CREATININE 2.51* 2.64* 2.73*    Hypokalemia: Resolved  Insulin-dependent diabetes mellitus with neuropathy: at home on Lantus 5 units daily.  cont SSI Recent Labs  Lab 09/19/22 1200 09/19/22 1808 09/19/22 2119 09/20/22 0745 09/20/22 1139  GLUCAP 125* 188* 151* 208* 290*    HLD-statin  Iron deficiency anemia: Baseline hemoglobin 7.9-8.7 in September and October. Recent Labs  Lab 09/18/22 1030 09/19/22 0250 09/20/22 0428  HGB 9.3* 8.1* 7.5*  HCT 29.8* 25.0* 23.2*   Low BMI at 17, dietitian consulted Nutrition Status:       DVT prophylaxis: SCDs Start: 09/18/22 1251 Code Status:   Code Status: Full Code Family Communication: plan of care discussed with patient at bedside. Patient status is: inpatient because of uncontrolled hypertension Level of care: Progressive   Dispo: The patient is from: home            Anticipated disposition home  Mobility Assessment (last 72 hours)     Mobility Assessment     Row Name 09/19/22 2041 09/19/22 1657         Does patient have an order for bedrest or is patient medically unstable No - Continue assessment No - Continue assessment      What is the highest level of mobility based on the progressive mobility assessment? Level 5 (Walks with assist in room/hall) - Balance while stepping forward/back and can walk in room with assist - Complete Level 5 (Walks with assist in room/hall) - Balance while stepping forward/back and can walk in room with assist - Complete                  Objective: Vitals last 24 hrs: Vitals:   09/20/22 0500 09/20/22 0551 09/20/22 0658 09/20/22 1113  BP:  (!) 190/105 (!) 162/84 (!) 165/91  Pulse:  79 84 74  Resp:  20    Temp:  97.8 F (36.6 C)    TempSrc:  Oral     SpO2:  100%    Weight: 52.8 kg      Weight change: -2.5 kg  Physical Examination: General exam: alert awake, older than stated age HEENT:Oral mucosa moist, Ear/Nose WNL grossly Respiratory system: bilaterally clear BS, no use of accessory muscle Cardiovascular system: S1 & S2 +, No JVD. Gastrointestinal system: Abdomen soft,NT,ND, BS+ Nervous System:Alert, awake, moving extremities. Extremities: LE edema neg,distal peripheral pulses palpable.  Skin: No rashes,no icterus. MSK: Normal muscle bulk,tone, power  Medications reviewed:  Scheduled Meds:  amLODipine  10 mg Oral Daily   atorvastatin  40 mg Oral Daily   carvedilol  25 mg Oral BID WC   gabapentin  100 mg Oral QHS   hydrALAZINE  50 mg Oral Q8H   insulin aspart  0-5 Units Subcutaneous QHS   insulin aspart  0-9 Units Subcutaneous TID WC   levETIRAcetam  500 mg Oral BID  Continuous Infusions:   Diet Order             Diet Heart Room service appropriate? Yes; Fluid consistency: Thin  Diet effective now                   Intake/Output Summary (Last 24 hours) at 09/20/2022 1245 Last data filed at 09/20/2022 1024 Gross per 24 hour  Intake 656 ml  Output 1500 ml  Net -844 ml   Net IO Since Admission: -2,750.54 mL [09/20/22 1245]  Wt Readings from Last 3 Encounters:  09/20/22 52.8 kg  09/07/22 51.7 kg  07/23/22 47.9 kg     Unresulted Labs (From admission, onward)     Start     Ordered   09/20/22 0500  Renal function panel  Tomorrow morning,   R       Question:  Specimen collection method  Answer:  Lab=Lab collect   09/19/22 1342   09/18/22 1342  Levetiracetam level  Once,   R        09/18/22 1341          Data Reviewed: I have personally reviewed following labs and imaging studies CBC: Recent Labs  Lab 09/18/22 1030 09/19/22 0250 09/20/22 0428  WBC 6.1 5.0 5.1  NEUTROABS 3.8  --   --   HGB 9.3* 8.1* 7.5*  HCT 29.8* 25.0* 23.2*  MCV 92.3 90.3 91.0  PLT 285 222 206   Basic Metabolic  Panel: Recent Labs  Lab 09/18/22 1030 09/19/22 0250 09/19/22 1836  NA 142 140 138  K 3.9 2.7* 3.6  CL 115* 113* 114*  CO2 21* 19* 19*  GLUCOSE 136* 108* 240*  BUN 29* 25* 26*  CREATININE 2.51* 2.64* 2.73*  CALCIUM 8.6* 7.9* 7.8*  MG 2.1  --   --    GFR: Estimated Creatinine Clearance: 16.9 mL/min (A) (by C-G formula based on SCr of 2.73 mg/dL (H)). Liver Function Tests: Recent Labs  Lab 09/18/22 1030 09/19/22 0250  AST 21 15  ALT 16 12  ALKPHOS 126 100  BILITOT 0.4 0.5  PROT 7.5 6.2*  ALBUMIN 3.0* 2.4*   Recent Labs  Lab 09/18/22 1030  AMMONIA 22  CBG: Recent Labs  Lab 09/19/22 1200 09/19/22 1808 09/19/22 2119 09/20/22 0745 09/20/22   1139  GLUCAP 125* 188* 151* 208* 290*   Lipid Profile: No results for input(s): "CHOL", "HDL", "LDLCALC", "TRIG", "CHOLHDL", "LDLDIRECT" in the last 72 hours. Thyroid Function Tests: Recent Labs    09/18/22 1027  TSH 2.409  FREET4 0.75   Sepsis Labs: Recent Labs  Lab 09/18/22 1030  LATICACIDVEN 1.0    Recent Results (from the past 240 hour(s))  Culture, blood (routine x 2)     Status: None (Preliminary result)   Collection Time: 09/18/22 10:53 AM   Specimen: BLOOD RIGHT HAND  Result Value Ref Range Status   Specimen Description   Final    BLOOD RIGHT HAND Performed at [facility], [address] [place], [place] [address]    Special Requests   Final    BOTTLES DRAWN AEROBIC AND ANAEROBIC Blood Culture results may not be optimal due to an inadequate volume of blood received in culture bottles Performed at [facility], [address] [place], [place] [address]    Culture   Final    NO GROWTH 2 DAYS Performed at [facility], [address] [place], [place] [address]    Report Status PENDING  Incomplete  Culture, blood (routine x 2)     Status: None (Preliminary result)   Collection Time: 09/18/22 10:54 AM   Specimen: BLOOD  Result Value Ref Range Status    Specimen Description   Final    BLOOD BLOOD LEFT FOREARM Performed at [facility], [address] [place], [place] [address]    Special Requests   Final    BOTTLES DRAWN AEROBIC AND ANAEROBIC Blood Culture adequate volume Performed at [facility], [address] [place], [place] [address]    Culture   Final    NO GROWTH 2 DAYS Performed at [facility], [address] [place], [place] [address]    Report Status PENDING  Incomplete    Antimicrobials: Anti-infectives (From admission, onward)    Start     Dose/Rate Route Frequency Ordered Stop   09/18/22 1045  ceFEPIme (MAXIPIME) 2 g in sodium chloride 0.9 % 100 mL IVPB        2 g 200 mL/hr over 30 Minutes Intravenous  Once 09/18/22 1038 09/18/22 1136   09/18/22 1045  metroNIDAZOLE (FLAGYL) IVPB 500 mg        500 mg 100 mL/hr over 60 Minutes Intravenous  Once 09/18/22 1038 09/18/22 1243   09/18/22 1045  vancomycin (VANCOCIN) IVPB 1000 mg/200 mL premix        1,000 mg 200 mL/hr over 60 Minutes Intravenous  Once 09/18/22 1039 09/18/22 1422      Culture/Microbiology    Component Value Date/Time   SDES  09/18/2022 1054    BLOOD BLOOD LEFT FOREARM Performed at [facility], [address] [place], [place] [address]    SPECREQUEST  09/18/2022 1054    BOTTLES DRAWN AEROBIC AND ANAEROBIC Blood Culture adequate volume Performed at [facility], [address] , [place] [address]    CULT  09/18/2022 1054    NO GROWTH 2 DAYS Performed at [facility], [address] ,  [address]    REPTSTATUS PENDING 09/18/2022 1054    Other culture-see note  Radiology Studies: VAS US RENAL ARTERY DUPLEX  Result Date: 09/19/2022 ABDOMINAL VISCERAL Patient Name:  Liberty WALKER Chowning  Date of Exam:   09/19/2022 Medical Rec #: [id number]                 Accession #:    [id number] Date of Birth: 06/10/1956               Patient Gender: F  Patient Age:   66 years Exam Location:  [facility] Procedure:      VAS US RENAL ARTERY DUPLEX Referring Phys: [id number] GRACE E BOWSER -------------------------------------------------------------------------------- Indications: HTN High Risk Factors: Hypertension, Diabetes, past history of smoking. Other Factors: CKD, CHF. Limitations: Rib shadowing. Performing Technologist: Jody Hill RVT, RDMS  Examination Guidelines: A complete evaluation includes B-mode imaging, spectral Doppler, color Doppler, and power Doppler as needed of all accessible portions of each vessel. Bilateral testing is considered an integral part of a complete examination. Limited examinations for reoccurring indications may be performed as noted.  Duplex Findings: +--------------------+--------+--------+------+--------+ Mesenteric          PSV cm/sEDV cm/sPlaqueComments +--------------------+--------+--------+------+--------+ Aorta Prox             74      11                  +--------------------+--------+--------+------+--------+ Celiac Artery Origin  145                          +--------------------+--------+--------+------+--------+ SMA Proximal          172      33                  +--------------------+--------+--------+------+--------+    +------------------+--------+--------+-------+ Right Renal ArteryPSV cm/sEDV cm/sComment +------------------+--------+--------+-------+ Origin               68      16           +------------------+--------+--------+-------+ Proximal             65      16           +------------------+--------+--------+-------+ Mid                  66      16           +------------------+--------+--------+-------+ Distal               63      13           +------------------+--------+--------+-------+ +-----------------+--------+--------+-------+ Left Renal ArteryPSV cm/sEDV cm/sComment +-----------------+--------+--------+-------+ Origin              70       11           +-----------------+--------+--------+-------+ Proximal            73      12           +-----------------+--------+--------+-------+ Mid                 55      16           +-----------------+--------+--------+-------+ Distal              63      13           +-----------------+--------+--------+-------+ +------------+--------+--------+----+-----------+--------+--------+----+ Right KidneyPSV cm/sEDV cm/sRI  Left KidneyPSV cm/sEDV cm/sRI   +------------+--------+--------+----+-----------+--------+--------+----+ Upper Pole  23      6       0.76Upper Pole                      +------------+--------+--------+----+-----------+--------+--------+----+ Mid         23        6       0.74Mid        25      6       0.76 +------------+--------+--------+----+-----------+--------+--------+----+ Lower Pole  26      6       0.76Lower Pole 20      4       0.79 +------------+--------+--------+----+-----------+--------+--------+----+ Hilar       22      6       0.74Hilar      45      15      0.67 +------------+--------+--------+----+-----------+--------+--------+----+ +------------------+----+------------------+----+ Right Kidney          Left Kidney            +------------------+----+------------------+----+ RAR                   RAR                    +------------------+----+------------------+----+ RAR (manual)      0.92RAR (manual)      0.99 +------------------+----+------------------+----+ Cortex            10/4Cortex            13/6 +------------------+----+------------------+----+ Cortex thickness      Corex thickness        +------------------+----+------------------+----+ Kidney length (cm)9.56Kidney length (cm)9.13 +------------------+----+------------------+----+  Summary: Renal:  Right: No evidence of right renal artery stenosis. Abnormal right        Resistive Index. RRV flow present. Normal size right kidney.         Cyst(s) noted. Left:  No evidence of left renal artery stenosis. Abnormal left        Resisitve Index. LRV flow present. Normal size of left        kidney. Cyst(s) noted. Mesenteric: Normal Celiac artery and Superior Mesenteric artery findings. Areas of limited visceral study include left kidney size (due to rib shadowing).  *See table(s) above for measurements and observations.     Preliminary    MR BRAIN WO CONTRAST  Result Date: 09/18/2022 CLINICAL DATA:  Mental status change, unknown cause EXAM: MRI HEAD WITHOUT CONTRAST TECHNIQUE: Multiplanar, multiecho pulse sequences of the brain and surrounding structures were obtained without intravenous contrast. COMPARISON:  CT head from the same day.  MRI head April 27, 2022. FINDINGS: Brain: No acute infarction, hemorrhage, hydrocephalus, extra-axial collection or mass lesion. Vascular: Major vertebral flow voids are maintained at the skull base. Skull and upper cervical spine: Normal marrow signal. Sinuses/Orbits: Mild paranasal sinus mucosal thickening. No acute orbital findings. Other: No mastoid effusions. IMPRESSION: No evidence of acute intracranial abnormality. Electronically Signed   By: Frederick S Jones M.D.   On: 09/18/2022 13:55     LOS: 2 days   Ramesh KC, MD [facility]  09/20/2022, 12:45 PM    

## 2022-09-20 NOTE — Hospital Course (Addendum)
  66 year old female with hypertension insulin-dependent diabetes CKD stage IIIa iron deficiency anemia previous seizure admitted with working diagnosis of hypertension emergency with acute encephalopathy possible seizure.  Patient presented to the ED with altered mental status in 11/2, daughter reported patient was "out of it" for unknown.  After time and EMS was called, there is concern for possible seizure, had an episode of incontinence. The ED sitting blood pressure 230s.  Labs with elevated creatinine 2.5 bicarb 21 normal CRP lactic acid anemia hemoglobin 9.3 g TSH 2.4 EV with proteinuria urine drug screen negative alcohol level less than 10 underwent chest x-ray-no acute finding CT head-no acute finding blood culture, admitted to ICU for further work-up with MRI brain-no acute finding. Managed with cerebral, blood pressure difficult to control nephrology was consulted.

## 2022-09-21 LAB — BASIC METABOLIC PANEL
Anion gap: 5 (ref 5–15)
BUN: 35 mg/dL — ABNORMAL HIGH (ref 8–23)
CO2: 21 mmol/L — ABNORMAL LOW (ref 22–32)
Calcium: 8.5 mg/dL — ABNORMAL LOW (ref 8.9–10.3)
Chloride: 111 mmol/L (ref 98–111)
Creatinine, Ser: 2.93 mg/dL — ABNORMAL HIGH (ref 0.44–1.00)
GFR, Estimated: 17 mL/min — ABNORMAL LOW (ref >60)
Glucose, Bld: 232 mg/dL — ABNORMAL HIGH (ref 70–99)
Potassium: 4.2 mmol/L (ref 3.5–5.1)
Sodium: 137 mmol/L (ref 135–145)

## 2022-09-21 LAB — CBC
HCT: 23.4 % — ABNORMAL LOW (ref 36.0–46.0)
Hemoglobin: 7.5 g/dL — ABNORMAL LOW (ref 12.0–15.0)
MCH: 29.2 pg (ref 26.0–34.0)
MCHC: 32.1 g/dL (ref 30.0–36.0)
MCV: 91.1 fL (ref 80.0–100.0)
Platelets: 221 10*3/uL (ref 150–400)
RBC: 2.57 MIL/uL — ABNORMAL LOW (ref 3.87–5.11)
RDW: 13.3 % (ref 11.5–15.5)
WBC: 5.3 10*3/uL (ref 4.0–10.5)
nRBC: 0 % (ref 0.0–0.2)

## 2022-09-21 LAB — GLUCOSE, CAPILLARY: Glucose-Capillary: 233 mg/dL — ABNORMAL HIGH (ref 70–99)

## 2022-09-21 MED ORDER — FERROUS SULFATE 325 (65 FE) MG PO TABS
325.0000 mg | ORAL_TABLET | Freq: Two times a day (BID) | ORAL | Status: DC
  Administered 2022-09-21: 325 mg via ORAL
  Filled 2022-09-21: qty 1

## 2022-09-21 MED ORDER — FREESTYLE LIBRE 3 SENSOR MISC: 1.0000 | 1 refills | Status: DC

## 2022-09-21 MED ORDER — FREESTYLE LIBRE 3 SENSOR MISC: 1.0000 | 1 refills | Status: AC

## 2022-09-21 MED ORDER — CARVEDILOL 25 MG PO TABS: 25.0000 mg | ORAL_TABLET | Freq: Two times a day (BID) | ORAL | 0 refills | Status: AC

## 2022-09-21 MED ORDER — AMLODIPINE BESYLATE 10 MG PO TABS: 10.0000 mg | ORAL_TABLET | Freq: Every day | ORAL | 0 refills | Status: AC

## 2022-09-21 MED ORDER — HYDRALAZINE HCL 50 MG PO TABS: 50.0000 mg | ORAL_TABLET | Freq: Three times a day (TID) | ORAL | 0 refills | Status: DC

## 2022-09-21 MED ORDER — AMLODIPINE BESYLATE 10 MG PO TABS: 10.0000 mg | ORAL_TABLET | Freq: Every day | ORAL | 0 refills | Status: DC

## 2022-09-21 MED ORDER — LEVETIRACETAM 500 MG PO TABS: 500.0000 mg | ORAL_TABLET | Freq: Two times a day (BID) | ORAL | 0 refills | Status: DC

## 2022-09-21 MED ORDER — CARVEDILOL 25 MG PO TABS: 25.0000 mg | ORAL_TABLET | Freq: Two times a day (BID) | ORAL | 0 refills | Status: DC

## 2022-09-21 NOTE — Plan of Care (Signed)
Discharge instructions reviewed with patient, questions answered, verbalized understanding. Patient transported to main entrance via wheelchair to be taken home by granddaughter.

## 2022-09-21 NOTE — Progress Notes (Signed)
OT Cancellation Note  Patient Details Name: Tricia Huynh MRN: [id number] DOB: [birth year]   Cancelled Treatment:    Reason Eval/Treat Not Completed: OT screened, no needs identified, will sign off. Patient has no apparent OT needs. Independent in room and ambulating independently.   Tricia Huynh 09/21/2022, 7:46 AM

## 2022-09-21 NOTE — Progress Notes (Signed)
NUTRITION NOTE RD working remotely.  Consult received for assessment of nutrition requirements and status. Patient was seen by another RD in person on 11/3. At that time, no additional nutrition-related needs were identified.  Able to communicate with MD via secure chat. No additional nutrition-related needs or requests at this time.  If nutrition-related needs arise, please re-consult RD.     Tricia Matin, MS, RD, LDN, CNSC Clinical Dietitian PRN/Relief staff On-call/weekend pager # available in [facility]

## 2022-09-22 LAB — LEVETIRACETAM LEVEL: Levetiracetam Lvl: <2 ug/mL — ABNORMAL LOW (ref 10.0–40.0)

## 2022-09-23 LAB — CULTURE, BLOOD (ROUTINE X 2)
Culture: NO GROWTH
Culture: NO GROWTH
Special Requests: ADEQUATE

## 2022-10-26 ENCOUNTER — Emergency Department (HOSPITAL_COMMUNITY)
Admission: EM | Admit: 2022-10-26 | Discharge: 2022-10-26 | Disposition: A | Discharge status: Home / Self Care | Payer: Medicare Other | Attending: Emergency Medicine | Admitting: Emergency Medicine

## 2022-10-26 ENCOUNTER — Emergency Department (HOSPITAL_COMMUNITY): Payer: Medicare Other

## 2022-10-26 ENCOUNTER — Other Ambulatory Visit: Payer: Self-pay

## 2022-10-26 DIAGNOSIS — N189 Chronic kidney disease, unspecified: Secondary | ICD-10-CM | POA: Insufficient documentation

## 2022-10-26 DIAGNOSIS — I129 Hypertensive chronic kidney disease with stage 1 through stage 4 chronic kidney disease, or unspecified chronic kidney disease: Secondary | ICD-10-CM | POA: Insufficient documentation

## 2022-10-26 DIAGNOSIS — Z79899 Other long term (current) drug therapy: Secondary | ICD-10-CM | POA: Insufficient documentation

## 2022-10-26 DIAGNOSIS — R059 Cough, unspecified: Secondary | ICD-10-CM | POA: Diagnosis present

## 2022-10-26 DIAGNOSIS — Z794 Long term (current) use of insulin: Secondary | ICD-10-CM | POA: Diagnosis not present

## 2022-10-26 DIAGNOSIS — Z1152 Encounter for screening for COVID-19: Secondary | ICD-10-CM | POA: Diagnosis not present

## 2022-10-26 DIAGNOSIS — E1122 Type 2 diabetes mellitus with diabetic chronic kidney disease: Secondary | ICD-10-CM | POA: Diagnosis not present

## 2022-10-26 DIAGNOSIS — J40 Bronchitis, not specified as acute or chronic: Secondary | ICD-10-CM

## 2022-10-26 DIAGNOSIS — J209 Acute bronchitis, unspecified: Secondary | ICD-10-CM | POA: Diagnosis not present

## 2022-10-26 DIAGNOSIS — I10 Essential (primary) hypertension: Secondary | ICD-10-CM

## 2022-10-26 LAB — CBC
HCT: 28.6 % — ABNORMAL LOW (ref 36.0–46.0)
Hemoglobin: 9 g/dL — ABNORMAL LOW (ref 12.0–15.0)
MCH: 28.5 pg (ref 26.0–34.0)
MCHC: 31.5 g/dL (ref 30.0–36.0)
MCV: 90.5 fL (ref 80.0–100.0)
Platelets: 234 10*3/uL (ref 150–400)
RBC: 3.16 MIL/uL — ABNORMAL LOW (ref 3.87–5.11)
RDW: 12.4 % (ref 11.5–15.5)
WBC: 6.5 10*3/uL (ref 4.0–10.5)
nRBC: 0 % (ref 0.0–0.2)

## 2022-10-26 LAB — BASIC METABOLIC PANEL
Anion gap: 7 (ref 5–15)
BUN: 23 mg/dL (ref 8–23)
CO2: 23 mmol/L (ref 22–32)
Calcium: 9 mg/dL (ref 8.9–10.3)
Chloride: 109 mmol/L (ref 98–111)
Creatinine, Ser: 2.82 mg/dL — ABNORMAL HIGH (ref 0.44–1.00)
GFR, Estimated: 18 mL/min — ABNORMAL LOW (ref >60)
Glucose, Bld: 233 mg/dL — ABNORMAL HIGH (ref 70–99)
Potassium: 4.4 mmol/L (ref 3.5–5.1)
Sodium: 139 mmol/L (ref 135–145)

## 2022-10-26 LAB — RESP PANEL BY RT-PCR (FLU A&B, COVID) ARPGX2
Influenza A by PCR: NEGATIVE
Influenza B by PCR: NEGATIVE
SARS Coronavirus 2 by RT PCR: NEGATIVE

## 2022-10-26 MED ORDER — AMLODIPINE BESYLATE 5 MG PO TABS
10.0000 mg | ORAL_TABLET | Freq: Once | ORAL | Status: AC
  Administered 2022-10-26: 10 mg via ORAL
  Filled 2022-10-26: qty 2

## 2022-10-26 MED ORDER — HYDRALAZINE HCL 25 MG PO TABS
50.0000 mg | ORAL_TABLET | Freq: Once | ORAL | Status: AC
  Administered 2022-10-26: 50 mg via ORAL
  Filled 2022-10-26: qty 2

## 2022-10-26 MED ORDER — CARVEDILOL 12.5 MG PO TABS
25.0000 mg | ORAL_TABLET | Freq: Two times a day (BID) | ORAL | Status: DC
  Administered 2022-10-26: 25 mg via ORAL
  Filled 2022-10-26: qty 2

## 2022-10-26 MED ORDER — OSELTAMIVIR PHOSPHATE 75 MG PO CAPS: 75.0000 mg | ORAL_CAPSULE | Freq: Once | ORAL | Status: DC

## 2022-10-26 MED ORDER — PREDNISONE 10 MG PO TABS: 40.0000 mg | ORAL_TABLET | Freq: Every day | ORAL | 0 refills | Status: AC

## 2022-10-26 MED ORDER — CARVEDILOL 12.5 MG PO TABS: 25.0000 mg | ORAL_TABLET | Freq: Two times a day (BID) | ORAL | Status: DC

## 2022-10-26 MED ORDER — ALBUTEROL SULFATE HFA 108 (90 BASE) MCG/ACT IN AERS: 1.0000 | INHALATION_SPRAY | Freq: Four times a day (QID) | RESPIRATORY_TRACT | 0 refills | Status: AC | PRN

## 2022-10-26 NOTE — ED Provider Triage Note (Signed)
Emergency Medicine Provider Triage Evaluation Note  Tricia Huynh , a 66 y.o. female  was evaluated in triage.  Pt complains of chest congestion, cough, sore throat, generalized bodyaches and chills for the last "few days".  Patient denies any overt fevers, diarrhea, nausea or vomiting.  Patient unsure of sick contacts.  Patient also noted to be hypertensive in triage, states she has not taken her amlodipine today.  Patient was complaining of chest pain however no shortness of breath.  Review of Systems  Positive:  Negative:   Physical Exam  BP (!) 229/128 (BP Location: Left Arm) Comment: pt didnt have blood pressure medication today  Pulse 95   Temp 99.3 F (37.4 C) (Oral)   Resp 18   Ht '5\' 8"'$  (1.727 m)   Wt 53 kg   SpO2 100%   BMI 17.77 kg/m  Gen:   Awake, no distress   Resp:  Normal effort  MSK:   Moves extremities without difficulty  Other:    Medical Decision Making  Medically screening exam initiated at 12:40 PM.  Appropriate orders placed.  Tricia Huynh was informed that the remainder of the evaluation will be completed by another provider, this initial triage assessment does not replace that evaluation, and the importance of remaining in the ED until their evaluation is complete.     Tricia Cecil, PA-C 10/26/22 1241

## 2022-10-26 NOTE — ED Provider Notes (Signed)
[facility] DEPT Provider Note   CSN: [id number] Arrival date & time: 10/26/22  1212     History  Chief Complaint  Patient presents with   Cough    Tricia Huynh is a 66 y.o. female.  HPI    66 year old female comes in with chief complaint of cough. Patient has past medical history of CKD, poorly controlled hypertension, diabetes.  Patient states that she has been coughing for the last 2 weeks or so.  Cough however has worsened in the last 2 or 3 days.  Cough is dry, it is worse at nighttime.  She has chest pain with deep cough.  Patient denies any associated nausea, vomiting, fevers and has no body aches.  Additionally patient is noted to be hypertensive.  She states that she has not taken her home BP meds today.  Patient was admitted to the hospital in November for hypertensive encephalopathy.  Patient states that when she takes her BP meds, her blood pressure ranges between 1 30-1 80 at home, most of the pressures are in the 180s.  Home Medications Prior to Admission medications   Medication Sig Start Date End Date Taking? Authorizing Provider  albuterol (VENTOLIN HFA) 108 (90 Base) MCG/ACT inhaler Inhale 1-2 puffs into the lungs every 6 (six) hours as needed for wheezing or shortness of breath. 10/26/22  Yes Varney Biles, MD  predniSONE (DELTASONE) 10 MG tablet Take 4 tablets (40 mg total) by mouth daily for 5 days. 10/26/22 10/31/22 Yes Dray Dente, MD  amLODipine (NORVASC) 10 MG tablet Take 1 tablet (10 mg total) by mouth daily. 09/21/22 10/21/22  Antonieta Pert, MD  atorvastatin (LIPITOR) 40 MG tablet Take 40 mg by mouth daily. 12/23/19   [provider]  carvedilol (COREG) 25 MG tablet Take 1 tablet (25 mg total) by mouth 2 (two) times daily with a meal. 09/21/22 10/21/22  Antonieta Pert, MD  Continuous Blood Gluc Sensor (FREESTYLE LIBRE 3 SENSOR) MISC 1 each by Does not apply route 2 (two) times a week. Place 1 sensor on the skin  every 14 days. Use to check glucose continuously 09/22/22   Antonieta Pert, MD  ferrous sulfate 325 (65 FE) MG tablet Take 1 tablet (325 mg total) by mouth daily with breakfast. 05/02/22   Raiford Noble Latif, DO  gabapentin (NEURONTIN) 100 MG capsule Take 1 capsule (100 mg total) by mouth at bedtime. 07/28/22   Danford, Suann Larry, MD  hydrALAZINE (APRESOLINE) 50 MG tablet Take 1 tablet (50 mg total) by mouth every 8 (eight) hours. 09/21/22 10/21/22  Antonieta Pert, MD  insulin glargine (LANTUS SOLOSTAR) 100 UNIT/ML Solostar Pen Inject 5 Units into the skin daily. Discard pen after 28 days from first use 07/28/22   Danford, Suann Larry, MD  Insulin Pen Needle (TECHLITE PEN NEEDLES) 32G X 4 MM MISC Use to inject lantus once a day 07/28/22   Danford, Suann Larry, MD  levETIRAcetam (KEPPRA) 500 MG tablet Take 1 tablet (500 mg total) by mouth 2 (two) times daily. 09/21/22 10/21/22  Antonieta Pert, MD  Multiple Vitamin (MULTIVITAMIN WITH MINERALS) TABS tablet Take 1 tablet by mouth daily. 05/02/22   Kerney Elbe, DO      Allergies    Percocet [oxycodone-acetaminophen] and Vicodin [hydrocodone-acetaminophen]    Review of Systems   Review of Systems  All other systems reviewed and are negative.   Physical Exam Updated Vital Signs BP (!) 169/84   Pulse 78   Temp 98.8 F (37.1 C) (  Oral)   Resp 16   Ht '5\' 8"'$  (1.727 m)   Wt 53 kg   SpO2 100%   BMI 17.77 kg/m  Physical Exam Vitals and nursing note reviewed.  Constitutional:      Appearance: She is well-developed.  HENT:     Head: Atraumatic.  Eyes:     Extraocular Movements: Extraocular movements intact.     Pupils: Pupils are equal, round, and reactive to light.  Cardiovascular:     Rate and Rhythm: Normal rate.  Pulmonary:     Effort: Pulmonary effort is normal.     Breath sounds: Wheezing present.     Comments: Diffuse wheezing Musculoskeletal:     Cervical back: Normal range of motion and neck supple.  Skin:    General: Skin is warm  and dry.  Neurological:     Mental Status: She is alert and oriented to person, place, and time.     ED Results / Procedures / Treatments   Labs (all labs ordered are listed, but only abnormal results are displayed) Labs Reviewed  CBC - Abnormal; Notable for the following components:      Result Value   RBC 3.16 (*)    Hemoglobin 9.0 (*)    HCT 28.6 (*)    All other components within normal limits  BASIC METABOLIC PANEL - Abnormal; Notable for the following components:   Glucose, Bld 233 (*)    Creatinine, Ser 2.82 (*)    GFR, Estimated 18 (*)    All other components within normal limits  RESP PANEL BY RT-PCR (FLU A&B, COVID) [id number]    EKG EKG Interpretation  Date/Time:  Sunday October 26 2022 12:22:54 EST Ventricular Rate:  94 PR Interval:  156 QRS Duration: 106 QT Interval:  384 QTC Calculation: 481 R Axis:   6 Text Interpretation: Sinus rhythm No acute changes No significant change since last tracing normal intervals Confirmed by Varney Biles [id number]) on 10/26/2022 1:18:44 PM  Radiology DG Chest 2 View  Result Date: 10/26/2022 CLINICAL DATA:  Shortness of breath and congestion. Rule out pneumonia EXAM: CHEST - 2 VIEW COMPARISON:  09/18/2022 FINDINGS: Normal heart size and mediastinal contours. There is no edema, consolidation, effusion, or pneumothorax. No acute osseous finding. Artifact from EKG leads. IMPRESSION: Negative for pneumonia. Electronically Signed   By: Jorje Guild M.D.   On: 10/26/2022 12:57    Procedures Procedures    Medications Ordered in ED Medications  carvedilol (COREG) tablet 25 mg (has no administration in time range)  amLODipine (NORVASC) tablet 10 mg (10 mg Oral Given 10/26/22 1310)  hydrALAZINE (APRESOLINE) tablet 50 mg (50 mg Oral Given 10/26/22 1323)    ED Course/ Medical Decision Making/ A&P Clinical Course as of 10/26/22 1551  Sun Oct 26, 2022  1550 BP(!): 169/84 Patient's BP has improved with amlodipine and hydralazine.   Coreg to be given before she leaves. [AN]  1550 DG Chest 2 View Independently interpreted the chest x-ray, there is no evidence of any pneumonia.  No evidence of pulmonary edema. [AN]  8144 Basic metabolic panel(!) [AN]  8185 Creatinine(!): 2.82 Patient is CKD is at baseline. [AN]    Clinical Course User Index [AN] Varney Biles, MD                           Medical Decision Making 66 year old female comes in with chief complaint of cough and is noted to have elevated blood pressure in the 230s  systolic.  Patient has history of CKD, hypertension.  On exam, patient has diffuse wheezing.  She does not appear to have any volume overload.  Cardiovascular exam otherwise reassuring.  Basic labs ordered to make sure there is no endorgan damage.  Chest x-ray, viral panel sent.  Patient's home BP medications given.  Problems Addressed: Bronchitis: acute illness or injury Uncontrolled hypertension: complicated acute illness or injury  Risk Prescription drug management.    Final Clinical Impression(s) / ED Diagnoses Final diagnoses:  Bronchitis  Uncontrolled hypertension    Rx / DC Orders ED Discharge Orders          Ordered    predniSONE (DELTASONE) 10 MG tablet  Daily        10/26/22 1532    albuterol (VENTOLIN HFA) 108 (90 Base) MCG/ACT inhaler  Every 6 hours PRN        10/26/22 [place], Bay Wayson, MD 10/26/22 1551

## 2022-10-26 NOTE — Discharge Instructions (Addendum)
You are seen in the emergency room for cough and noted to have elevated blood pressure.  Your blood pressure has come down with your home medications.  Take your medications as prescribed.  Check your blood pressure first thing in the morning, sometime in the afternoon and again before you go to bed.  Make sure you are relaxed and doing all of those measurements.  Keep a log of it and either follow-up with your primary care doctor or follow-up with the doctor at the number provided to ensure you are getting proper care.

## 2022-10-26 NOTE — ED Triage Notes (Signed)
Pt reports cough x 1 week. Chest "burning" when coughing. Also c/o sore throat "hard to swallow"

## 2023-01-13 ENCOUNTER — Other Ambulatory Visit (HOSPITAL_COMMUNITY): Payer: Self-pay

## 2023-01-14 ENCOUNTER — Inpatient Hospital Stay (HOSPITAL_COMMUNITY)
Admission: RE | Admit: 2023-01-14 | Discharge: 2023-01-14 | Disposition: A | Discharge status: Home / Self Care | Payer: Medicare Other | Source: Ambulatory Visit | Attending: Nephrology | Admitting: Nephrology

## 2023-01-28 ENCOUNTER — Encounter (HOSPITAL_COMMUNITY): Payer: 59

## 2023-02-02 ENCOUNTER — Encounter (HOSPITAL_COMMUNITY): Payer: Self-pay

## 2023-02-02 ENCOUNTER — Other Ambulatory Visit: Payer: Self-pay

## 2023-02-02 ENCOUNTER — Inpatient Hospital Stay (HOSPITAL_COMMUNITY)
Admission: EM | Admit: 2023-02-02 | Discharge: 2023-02-12 | DRG: 674 | Disposition: A | Discharge status: Home / Self Care | Payer: 59 | Attending: Internal Medicine | Admitting: Internal Medicine

## 2023-02-02 ENCOUNTER — Emergency Department (HOSPITAL_COMMUNITY): Payer: 59

## 2023-02-02 DIAGNOSIS — G40909 Epilepsy, unspecified, not intractable, without status epilepticus: Secondary | ICD-10-CM | POA: Diagnosis present

## 2023-02-02 DIAGNOSIS — R0602 Shortness of breath: Secondary | ICD-10-CM | POA: Diagnosis present

## 2023-02-02 DIAGNOSIS — Z885 Allergy status to narcotic agent status: Secondary | ICD-10-CM

## 2023-02-02 DIAGNOSIS — R079 Chest pain, unspecified: Secondary | ICD-10-CM

## 2023-02-02 DIAGNOSIS — Z79899 Other long term (current) drug therapy: Secondary | ICD-10-CM | POA: Diagnosis not present

## 2023-02-02 DIAGNOSIS — Z20822 Contact with and (suspected) exposure to covid-19: Secondary | ICD-10-CM | POA: Diagnosis not present

## 2023-02-02 DIAGNOSIS — Z833 Family history of diabetes mellitus: Secondary | ICD-10-CM | POA: Diagnosis not present

## 2023-02-02 DIAGNOSIS — I5032 Chronic diastolic (congestive) heart failure: Secondary | ICD-10-CM | POA: Diagnosis present

## 2023-02-02 DIAGNOSIS — R1013 Epigastric pain: Secondary | ICD-10-CM | POA: Diagnosis not present

## 2023-02-02 DIAGNOSIS — E860 Dehydration: Secondary | ICD-10-CM | POA: Diagnosis present

## 2023-02-02 DIAGNOSIS — E876 Hypokalemia: Secondary | ICD-10-CM | POA: Diagnosis not present

## 2023-02-02 DIAGNOSIS — I10 Essential (primary) hypertension: Secondary | ICD-10-CM | POA: Diagnosis not present

## 2023-02-02 DIAGNOSIS — E0849 Diabetes mellitus due to underlying condition with other diabetic neurological complication: Secondary | ICD-10-CM | POA: Diagnosis not present

## 2023-02-02 DIAGNOSIS — E785 Hyperlipidemia, unspecified: Secondary | ICD-10-CM | POA: Diagnosis present

## 2023-02-02 DIAGNOSIS — Z681 Body mass index (BMI) 19 or less, adult: Secondary | ICD-10-CM | POA: Diagnosis not present

## 2023-02-02 DIAGNOSIS — D631 Anemia in chronic kidney disease: Secondary | ICD-10-CM | POA: Diagnosis present

## 2023-02-02 DIAGNOSIS — I132 Hypertensive heart and chronic kidney disease with heart failure and with stage 5 chronic kidney disease, or end stage renal disease: Secondary | ICD-10-CM | POA: Diagnosis present

## 2023-02-02 DIAGNOSIS — Z87891 Personal history of nicotine dependence: Secondary | ICD-10-CM

## 2023-02-02 DIAGNOSIS — Z8249 Family history of ischemic heart disease and other diseases of the circulatory system: Secondary | ICD-10-CM

## 2023-02-02 DIAGNOSIS — R531 Weakness: Secondary | ICD-10-CM | POA: Diagnosis not present

## 2023-02-02 DIAGNOSIS — N186 End stage renal disease: Secondary | ICD-10-CM | POA: Diagnosis present

## 2023-02-02 DIAGNOSIS — E1042 Type 1 diabetes mellitus with diabetic polyneuropathy: Secondary | ICD-10-CM

## 2023-02-02 DIAGNOSIS — R2689 Other abnormalities of gait and mobility: Secondary | ICD-10-CM | POA: Diagnosis not present

## 2023-02-02 DIAGNOSIS — N170 Acute kidney failure with tubular necrosis: Principal | ICD-10-CM | POA: Diagnosis present

## 2023-02-02 DIAGNOSIS — R569 Unspecified convulsions: Secondary | ICD-10-CM | POA: Diagnosis not present

## 2023-02-02 DIAGNOSIS — N185 Chronic kidney disease, stage 5: Secondary | ICD-10-CM | POA: Diagnosis not present

## 2023-02-02 DIAGNOSIS — N179 Acute kidney failure, unspecified: Secondary | ICD-10-CM | POA: Diagnosis present

## 2023-02-02 DIAGNOSIS — Z992 Dependence on renal dialysis: Secondary | ICD-10-CM | POA: Diagnosis not present

## 2023-02-02 DIAGNOSIS — Z841 Family history of disorders of kidney and ureter: Secondary | ICD-10-CM

## 2023-02-02 DIAGNOSIS — R519 Headache, unspecified: Secondary | ICD-10-CM | POA: Diagnosis not present

## 2023-02-02 DIAGNOSIS — E1122 Type 2 diabetes mellitus with diabetic chronic kidney disease: Secondary | ICD-10-CM | POA: Diagnosis present

## 2023-02-02 DIAGNOSIS — E118 Type 2 diabetes mellitus with unspecified complications: Secondary | ICD-10-CM

## 2023-02-02 DIAGNOSIS — K219 Gastro-esophageal reflux disease without esophagitis: Secondary | ICD-10-CM | POA: Diagnosis present

## 2023-02-02 DIAGNOSIS — Z794 Long term (current) use of insulin: Secondary | ICD-10-CM

## 2023-02-02 DIAGNOSIS — E1140 Type 2 diabetes mellitus with diabetic neuropathy, unspecified: Secondary | ICD-10-CM | POA: Diagnosis present

## 2023-02-02 DIAGNOSIS — E440 Moderate protein-calorie malnutrition: Secondary | ICD-10-CM | POA: Diagnosis present

## 2023-02-02 DIAGNOSIS — R5381 Other malaise: Secondary | ICD-10-CM | POA: Diagnosis not present

## 2023-02-02 DIAGNOSIS — I509 Heart failure, unspecified: Secondary | ICD-10-CM | POA: Diagnosis not present

## 2023-02-02 HISTORY — DX: Anemia in chronic kidney disease: N18.4

## 2023-02-02 HISTORY — DX: Anemia in chronic kidney disease: D63.1

## 2023-02-02 LAB — BRAIN NATRIURETIC PEPTIDE: B Natriuretic Peptide: 1291.7 pg/mL — ABNORMAL HIGH (ref 0.0–100.0)

## 2023-02-02 LAB — BASIC METABOLIC PANEL
Anion gap: 7 (ref 5–15)
BUN: 41 mg/dL — ABNORMAL HIGH (ref 8–23)
CO2: 20 mmol/L — ABNORMAL LOW (ref 22–32)
Calcium: 8 mg/dL — ABNORMAL LOW (ref 8.9–10.3)
Chloride: 110 mmol/L (ref 98–111)
Creatinine, Ser: 4.44 mg/dL — ABNORMAL HIGH (ref 0.44–1.00)
GFR, Estimated: 10 mL/min — ABNORMAL LOW (ref >60)
Glucose, Bld: 328 mg/dL — ABNORMAL HIGH (ref 70–99)
Potassium: 3.9 mmol/L (ref 3.5–5.1)
Sodium: 137 mmol/L (ref 135–145)

## 2023-02-02 LAB — CBC WITH DIFFERENTIAL/PLATELET
Abs Immature Granulocytes: 0.02 10*3/uL (ref 0.00–0.07)
Basophils Absolute: 0 10*3/uL (ref 0.0–0.1)
Basophils Relative: 0 %
Eosinophils Absolute: 0 10*3/uL (ref 0.0–0.5)
Eosinophils Relative: 0 %
HCT: 23.1 % — ABNORMAL LOW (ref 36.0–46.0)
Hemoglobin: 7.3 g/dL — ABNORMAL LOW (ref 12.0–15.0)
Immature Granulocytes: 0 %
Lymphocytes Relative: 24 %
Lymphs Abs: 1.5 10*3/uL (ref 0.7–4.0)
MCH: 29.1 pg (ref 26.0–34.0)
MCHC: 31.6 g/dL (ref 30.0–36.0)
MCV: 92 fL (ref 80.0–100.0)
Monocytes Absolute: 0.5 10*3/uL (ref 0.1–1.0)
Monocytes Relative: 8 %
Neutro Abs: 4.1 10*3/uL (ref 1.7–7.7)
Neutrophils Relative %: 68 %
Platelets: 204 10*3/uL (ref 150–400)
RBC: 2.51 MIL/uL — ABNORMAL LOW (ref 3.87–5.11)
RDW: 14.6 % (ref 11.5–15.5)
WBC: 6.1 10*3/uL (ref 4.0–10.5)
nRBC: 0 % (ref 0.0–0.2)

## 2023-02-02 LAB — TROPONIN I (HIGH SENSITIVITY)
Troponin I (High Sensitivity): 25 ng/L — ABNORMAL HIGH (ref <18)
Troponin I (High Sensitivity): 22 ng/L — ABNORMAL HIGH (ref <18)

## 2023-02-02 LAB — GLUCOSE, CAPILLARY: Glucose-Capillary: 116 mg/dL — ABNORMAL HIGH (ref 70–99)

## 2023-02-02 MED ORDER — PANTOPRAZOLE SODIUM 40 MG PO TBEC
40.0000 mg | DELAYED_RELEASE_TABLET | Freq: Every day | ORAL | Status: DC
  Administered 2023-02-02 – 2023-02-12 (×11): 40 mg via ORAL
  Filled 2023-02-02 (×11): qty 1

## 2023-02-02 MED ORDER — HYDRALAZINE HCL 20 MG/ML IJ SOLN
10.0000 mg | INTRAMUSCULAR | Status: DC | PRN
  Administered 2023-02-02 – 2023-02-05 (×2): 10 mg via INTRAVENOUS
  Filled 2023-02-02 (×2): qty 1

## 2023-02-02 MED ORDER — LIDOCAINE VISCOUS HCL 2 % MT SOLN
15.0000 mL | Freq: Once | OROMUCOSAL | Status: AC
  Administered 2023-02-02: 15 mL via ORAL
  Filled 2023-02-02: qty 15

## 2023-02-02 MED ORDER — FAMOTIDINE IN NACL 20-0.9 MG/50ML-% IV SOLN
20.0000 mg | Freq: Once | INTRAVENOUS | Status: AC
  Administered 2023-02-02: 20 mg via INTRAVENOUS
  Filled 2023-02-02: qty 50

## 2023-02-02 MED ORDER — INSULIN ASPART 100 UNIT/ML IJ SOLN
0.0000 [IU] | Freq: Three times a day (TID) | INTRAMUSCULAR | Status: DC
  Administered 2023-02-03: 2 [IU] via SUBCUTANEOUS
  Administered 2023-02-03 – 2023-02-04 (×2): 4 [IU] via SUBCUTANEOUS
  Administered 2023-02-04 (×2): 2 [IU] via SUBCUTANEOUS
  Administered 2023-02-05: 4 [IU] via SUBCUTANEOUS
  Administered 2023-02-05: 2 [IU] via SUBCUTANEOUS
  Administered 2023-02-06: 8 [IU] via SUBCUTANEOUS
  Administered 2023-02-07: 2 [IU] via SUBCUTANEOUS
  Administered 2023-02-08: 8 [IU] via SUBCUTANEOUS
  Administered 2023-02-08: 2 [IU] via SUBCUTANEOUS
  Administered 2023-02-09: 4 [IU] via SUBCUTANEOUS
  Administered 2023-02-10: 8 [IU] via SUBCUTANEOUS
  Administered 2023-02-11: 4 [IU] via SUBCUTANEOUS
  Administered 2023-02-11: 2 [IU] via SUBCUTANEOUS
  Administered 2023-02-12: 4 [IU] via SUBCUTANEOUS

## 2023-02-02 MED ORDER — SODIUM CHLORIDE 0.9 % IV BOLUS
1000.0000 mL | Freq: Once | INTRAVENOUS | Status: AC
  Administered 2023-02-02: 1000 mL via INTRAVENOUS

## 2023-02-02 MED ORDER — LEVETIRACETAM 500 MG PO TABS
500.0000 mg | ORAL_TABLET | Freq: Two times a day (BID) | ORAL | Status: DC
  Administered 2023-02-02 – 2023-02-12 (×20): 500 mg via ORAL
  Filled 2023-02-02 (×20): qty 1

## 2023-02-02 MED ORDER — ALUM & MAG HYDROXIDE-SIMETH 200-200-20 MG/5ML PO SUSP
30.0000 mL | Freq: Once | ORAL | Status: AC
  Administered 2023-02-02: 30 mL via ORAL
  Filled 2023-02-02: qty 30

## 2023-02-02 MED ORDER — ACETAMINOPHEN 325 MG PO TABS
650.0000 mg | ORAL_TABLET | Freq: Four times a day (QID) | ORAL | Status: DC | PRN
  Administered 2023-02-02 – 2023-02-06 (×3): 650 mg via ORAL
  Filled 2023-02-02 (×3): qty 2

## 2023-02-02 MED ORDER — ATORVASTATIN CALCIUM 40 MG PO TABS
40.0000 mg | ORAL_TABLET | Freq: Every day | ORAL | Status: DC
  Administered 2023-02-02 – 2023-02-12 (×11): 40 mg via ORAL
  Filled 2023-02-02 (×11): qty 1

## 2023-02-02 MED ORDER — AMLODIPINE BESYLATE 10 MG PO TABS
10.0000 mg | ORAL_TABLET | Freq: Every day | ORAL | Status: DC
  Administered 2023-02-02 – 2023-02-12 (×11): 10 mg via ORAL
  Filled 2023-02-02: qty 1
  Filled 2023-02-02: qty 2
  Filled 2023-02-02 (×9): qty 1

## 2023-02-02 MED ORDER — CARVEDILOL 25 MG PO TABS
25.0000 mg | ORAL_TABLET | Freq: Two times a day (BID) | ORAL | Status: DC
  Administered 2023-02-02 – 2023-02-11 (×17): 25 mg via ORAL
  Filled 2023-02-02 (×4): qty 1
  Filled 2023-02-02: qty 2
  Filled 2023-02-02 (×12): qty 1

## 2023-02-02 MED ORDER — ACETAMINOPHEN 650 MG RE SUPP: 650.0000 mg | Freq: Four times a day (QID) | RECTAL | Status: DC | PRN

## 2023-02-02 NOTE — ED Triage Notes (Signed)
Chest burning for a few weeks with intermittent sharp pains. Lightheadedness. Pt feels Sob when laying flat and has to sit up to feel relief.  C/o Mid Lower back pain that is intermittent.

## 2023-02-02 NOTE — ED Provider Notes (Signed)
[facility] Provider Note  CSN: [id number] Arrival date & time: 02/02/23 1313  Chief Complaint(s) Chest Pain and Back Pain  HPI Tricia Huynh is a 67 y.o. female with past medical history as below, significant for DM2, HLD, CKD, anemia of chronic disease, diastolic heart failure [id number] who presents to the ED with complaint of chest burning sensation.  Reports over the past week she has had burning sensation to her midline chest, radiation to her neck and back of her throat.  Worse when she lies down flat.  Worse after p.o. intake.  Ongoing over the past week.  No medications tried at home Doppler symptoms.  Denies similar symptoms in the past.  No nausea or vomiting.  Does report reduced p.o. intake over the past few days secondary to burning sensation.  No change to bowel or bladder function.  No BRBPR or melena.  She has anemia of chronic disease, she is scheduled for iron infusion but missed the appointment due to scheduling issue.  Past Medical History Past Medical History:  Diagnosis Date   Anemia of chronic kidney failure, stage 4 (severe) (HCC)    Diabetes mellitus type 2 in nonobese ([place]) 06/06/2015   Diabetes mellitus without complication ([place])    Diabetic neuropathy ([place]) 06/06/2015   Dyslipidemia 06/06/2015   Encephalopathy    Essential hypertension 06/06/2015   Hypertension    Mood disorder ([place]) 06/06/2015   Pneumonia 03/11/2017   Seizure ([place])    sees [name]. abd eeg, on keppra   Patient Active Problem List   Diagnosis Date Noted   AKI (acute kidney injury) ([place]) 02/02/2023   Hypertensive emergency 09/18/2022   Seizure ([place]) 09/18/2022   Encephalopathy acute 09/18/2022   Anemia of chronic kidney failure, stage 4 (severe) ([place]) 07/27/2022   Hyponatremia 07/25/2022   Hypertensive crisis 07/23/2022   Hypokalemia 07/23/2022   Malnutrition of moderate degree 04/29/2022   Hyperglycemia due to type 2 diabetes mellitus  ([place]) 04/27/2022   Focal neurological deficit 04/27/2022   Scalp cyst 04/27/2022   Chronic diastolic CHF (congestive heart failure) ([place]) 04/27/2022   AIN grade I 12/16/2021   External hemorrhoids with complication 0000000   Colitis 12/15/2020   HCAP (healthcare-associated pneumonia) 03/11/2017   Dyslipidemia 03/11/2017   Diabetes mellitus with complication ([place])    Chest pain 12/30/2016   Seizure disorder ([place]) 12/30/2016   Diabetic ketoacidosis without coma associated with type 2 diabetes mellitus ([place])    DKA (diabetic ketoacidoses) 11/15/2016   UTI (urinary tract infection) 11/15/2016   Acute renal failure superimposed on stage 3b chronic kidney disease ([place])    Hyperglycemia 11/14/2016   Diabetes ([place]) 06/05/2016   CKD (chronic kidney disease) stage 3, GFR 30-59 ml/min ([place]) 05/26/2016   Benign essential HTN 05/26/2016   Seizures ([place]) 01/08/2016   Memory loss 10/04/2015   Diabetic neuropathy ([place]) 06/06/2015   Home Medication(s) Prior to Admission medications   Medication Sig Start Date End Date Taking? Authorizing Provider  albuterol (VENTOLIN HFA) 108 (90 Base) MCG/ACT inhaler Inhale 1-2 puffs into the lungs every 6 (six) hours as needed for wheezing or shortness of breath. 10/26/22   Varney Biles, MD  amLODipine (NORVASC) 10 MG tablet Take 1 tablet (10 mg total) by mouth daily. 09/21/22 10/21/22  Antonieta Pert, MD  atorvastatin (LIPITOR) 40 MG tablet Take 40 mg by mouth daily. 12/23/19   [provider]  carvedilol (COREG) 25 MG tablet Take 1 tablet (25 mg total) by mouth 2 (  two) times daily with a meal. 09/21/22 10/21/22  Antonieta Pert, MD  Continuous Blood Gluc Sensor (FREESTYLE LIBRE 3 SENSOR) MISC 1 each by Does not apply route 2 (two) times a week. Place 1 sensor on the skin every 14 days. Use to check glucose continuously 09/22/22   Antonieta Pert, MD  ferrous sulfate 325 (65 FE) MG tablet Take 1 tablet (325 mg total) by mouth daily with breakfast. 05/02/22   Raiford Noble  Latif, DO  gabapentin (NEURONTIN) 100 MG capsule Take 1 capsule (100 mg total) by mouth at bedtime. 07/28/22   Danford, Suann Larry, MD  hydrALAZINE (APRESOLINE) 50 MG tablet Take 1 tablet (50 mg total) by mouth every 8 (eight) hours. 09/21/22 10/21/22  Antonieta Pert, MD  insulin glargine (LANTUS SOLOSTAR) 100 UNIT/ML Solostar Pen Inject 5 Units into the skin daily. Discard pen after 28 days from first use 07/28/22   Danford, Suann Larry, MD  Insulin Pen Needle (TECHLITE PEN NEEDLES) 32G X 4 MM MISC Use to inject lantus once a day 07/28/22   Danford, Suann Larry, MD  levETIRAcetam (KEPPRA) 500 MG tablet Take 1 tablet (500 mg total) by mouth 2 (two) times daily. 09/21/22 02/02/23  Antonieta Pert, MD  Multiple Vitamin (MULTIVITAMIN WITH MINERALS) TABS tablet Take 1 tablet by mouth daily. 05/02/22   Kerney Elbe, DO                                                                                                                                    Past Surgical History Past Surgical History:  Procedure Laterality Date   ABDOMINAL HYSTERECTOMY     EYE SURGERY Bilateral    TUBAL LIGATION     Family History Family History  Problem Relation Age of Onset   Heart disease Father    Kidney disease Father    Diabetes Mother    Seizures Sister    Seizures Brother     Social History Social History   Tobacco Use   Smoking status: Former    Types: Cigarettes   Smokeless tobacco: Never  Vaping Use   Vaping Use: Never used  Substance Use Topics   Alcohol use: No   Drug use: No   Allergies Percocet [oxycodone-acetaminophen] and Vicodin [hydrocodone-acetaminophen]  Review of Systems Review of Systems  Constitutional:  Negative for activity change and fever.  HENT:  Negative for facial swelling and trouble swallowing.   Eyes:  Negative for discharge and redness.  Respiratory:  Negative for cough and shortness of breath.   Cardiovascular:  Positive for chest pain. Negative for palpitations.   Gastrointestinal:  Negative for abdominal pain and nausea.  Genitourinary:  Negative for dysuria and flank pain.  Musculoskeletal:  Negative for back pain and gait problem.  Skin:  Negative for pallor and rash.  Neurological:  Negative for syncope and headaches.    Physical Exam Vital Signs  I have reviewed the triage vital  signs BP (!) 165/95   Pulse 73   Temp 98.5 F (36.9 C) (Oral)   Resp 17   Ht 5\' 8"  (1.727 m)   Wt 57.6 kg   SpO2 98%   BMI 19.31 kg/m  Physical Exam Vitals and nursing note reviewed.  Constitutional:      General: She is not in acute distress.    Appearance: Normal appearance. She is well-developed. She is not ill-appearing or diaphoretic.     Comments: Frail  HENT:     Head: Normocephalic and atraumatic.     Right Ear: External ear normal.     Left Ear: External ear normal.     Nose: Nose normal.     Mouth/Throat:     Mouth: Mucous membranes are dry.  Eyes:     General: No scleral icterus.       Right eye: No discharge.        Left eye: No discharge.  Cardiovascular:     Rate and Rhythm: Normal rate and regular rhythm.     Pulses: Normal pulses.     Heart sounds: Normal heart sounds.  Pulmonary:     Effort: Pulmonary effort is normal. No respiratory distress.     Breath sounds: Normal breath sounds. No wheezing.  Abdominal:     General: Abdomen is flat.     Palpations: Abdomen is soft.     Tenderness: There is no abdominal tenderness.  Musculoskeletal:     Right lower leg: No edema.     Left lower leg: No edema.  Skin:    General: Skin is warm and dry.     Capillary Refill: Capillary refill takes less than 2 seconds.  Neurological:     Mental Status: She is alert and oriented to person, place, and time.     GCS: GCS eye subscore is 4. GCS verbal subscore is 5. GCS motor subscore is 6.  Psychiatric:        Mood and Affect: Mood normal.        Behavior: Behavior normal.     ED Results and Treatments Labs (all labs ordered are  listed, but only abnormal results are displayed) Labs Reviewed  BASIC METABOLIC PANEL - Abnormal; Notable for the following components:      Result Value   CO2 20 (*)    Glucose, Bld 328 (*)    BUN 41 (*)    Creatinine, Ser 4.44 (*)    Calcium 8.0 (*)    GFR, Estimated 10 (*)    All other components within normal limits  CBC WITH DIFFERENTIAL/PLATELET - Abnormal; Notable for the following components:   RBC 2.51 (*)    Hemoglobin 7.3 (*)    HCT 23.1 (*)    All other components within normal limits  BRAIN NATRIURETIC PEPTIDE - Abnormal; Notable for the following components:   B Natriuretic Peptide 1,291.7 (*)    All other components within normal limits  TROPONIN I (HIGH SENSITIVITY) - Abnormal; Notable for the following components:   Troponin I (High Sensitivity) 25 (*)    All other components within normal limits  TROPONIN I (HIGH SENSITIVITY) - Abnormal; Notable for the following components:   Troponin I (High Sensitivity) 22 (*)    All other components within normal limits  BASIC METABOLIC PANEL  Radiology DG Chest 2 View  Result Date: 02/02/2023 CLINICAL DATA:  Chest pain, shortness of breath. EXAM: CHEST - 2 VIEW COMPARISON:  10/26/2022. FINDINGS: Clear lungs. Stable mild cardiomegaly and mediastinal contours. No pleural effusion or pneumothorax. Visualized bones and upper abdomen are unremarkable. IMPRESSION: No evidence of acute cardiopulmonary disease. Electronically Signed   By: Emmit Alexanders M.D.   On: 02/02/2023 14:01    Pertinent labs & imaging results that were available during my care of the patient were reviewed by me and considered in my medical decision making (see MDM for details).  Medications Ordered in ED Medications  amLODipine (NORVASC) tablet 10 mg (has no administration in time range)  atorvastatin (LIPITOR) tablet 40 mg (has no  administration in time range)  carvedilol (COREG) tablet 25 mg (has no administration in time range)  levETIRAcetam (KEPPRA) tablet 500 mg (has no administration in time range)  acetaminophen (TYLENOL) tablet 650 mg (has no administration in time range)    Or  acetaminophen (TYLENOL) suppository 650 mg (has no administration in time range)  alum & mag hydroxide-simeth (MAALOX/MYLANTA) 200-200-20 MG/5ML suspension 30 mL (30 mLs Oral Given 02/02/23 1434)    And  lidocaine (XYLOCAINE) 2 % viscous mouth solution 15 mL (15 mLs Oral Given 02/02/23 1434)  famotidine (PEPCID) IVPB 20 mg premix (0 mg Intravenous Stopped 02/02/23 1504)  sodium chloride 0.9 % bolus 1,000 mL (0 mLs Intravenous Stopped 02/02/23 1546)  sodium chloride 0.9 % bolus 1,000 mL (0 mLs Intravenous Stopped 02/02/23 1556)                                                                                                                                     Procedures .Critical Care  Performed by: Jeanell Sparrow, DO Authorized by: Jeanell Sparrow, DO   Critical care provider statement:    Critical care time (minutes):  30   Critical care time was exclusive of:  Separately billable procedures and treating other patients   Critical care was necessary to treat or prevent imminent or life-threatening deterioration of the following conditions:  Renal failure and cardiac failure   Critical care was time spent personally by me on the following activities:  Development of treatment plan with patient or surrogate, discussions with consultants, evaluation of patient's response to treatment, examination of patient, ordering and review of laboratory studies, ordering and review of radiographic studies, ordering and performing treatments and interventions, pulse oximetry, re-evaluation of patient's condition, review of old charts and obtaining history from patient or surrogate   Care discussed with: admitting provider     (including critical care  time)  Medical Decision Making / ED Course    Medical Decision Making:    Yuleimi Belmer is a 67 y.o. female with past medical history as below, significant for DM2, HLD, CKD, anemia of chronic disease, diastolic heart failure [id number] who presents to the ED with complaint of  chest burning sensation.. The complaint involves an extensive differential diagnosis and also carries with it a high risk of complications and morbidity.  Serious etiology was considered. Ddx includes but is not limited to: Differential includes all life-threatening causes for chest pain. This includes but is not exclusive to acute coronary syndrome, aortic dissection, pulmonary embolism, cardiac tamponade, community-acquired pneumonia, pericarditis, musculoskeletal chest wall pain, etc.   Complete initial physical exam performed, notably the patient  was no acute distress, resting comfortably, currently asymptomatic.    Reviewed and confirmed nursing documentation for past medical history, family history, social history.  Vital signs reviewed.    Clinical Course as of 02/02/23 1757  Mon Feb 02, 2023  1502 Patient reassessed, she is feeling much better after GI cocktail.  Will attempt p.o. challenge.  Prior echo was reviewed.  She has worsening kidney function from her baseline CKD.  Reports poor p.o. last few days secondary to chest discomfort/burning sensation.  Will give further IV fluids. [SG]    Clinical Course User Index [SG] Wynona Dove A, DO   Her chest pain has improved after intervention. She has AKI on CKD, baseline Cr <3, today is 4.4. BUN 41. BNP 1291, lungs clear. No sig LE edema. Hgb is low, she has anemia of chronic dz on iron infusion/supplementation.  Slight trop elev, down trending; favor 2/2 ckd  [facility] to [facility]   Additional history obtained: -Additional history obtained from na -External records from outside source obtained and reviewed including: Chart review including previous notes,  labs, imaging, consultation notes including her admission, prior ED visits, prior labs and imaging, home medications   Lab Tests: -I ordered, reviewed, and interpreted labs.   The pertinent results include:   Labs Reviewed  BASIC METABOLIC PANEL - Abnormal; Notable for the following components:      Result Value   CO2 20 (*)    Glucose, Bld 328 (*)    BUN 41 (*)    Creatinine, Ser 4.44 (*)    Calcium 8.0 (*)    GFR, Estimated 10 (*)    All other components within normal limits  CBC WITH DIFFERENTIAL/PLATELET - Abnormal; Notable for the following components:   RBC 2.51 (*)    Hemoglobin 7.3 (*)    HCT 23.1 (*)    All other components within normal limits  BRAIN NATRIURETIC PEPTIDE - Abnormal; Notable for the following components:   B Natriuretic Peptide 1,291.7 (*)    All other components within normal limits  TROPONIN I (HIGH SENSITIVITY) - Abnormal; Notable for the following components:   Troponin I (High Sensitivity) 25 (*)    All other components within normal limits  TROPONIN I (HIGH SENSITIVITY) - Abnormal; Notable for the following components:   Troponin I (High Sensitivity) 22 (*)    All other components within normal limits  BASIC METABOLIC PANEL    Notable for troponin mildly elevated, she has CKD.  Creatinine elevated from baseline, she has CKD as well.  Globin is low, she has anemia of chronic disease.  On iron supplementation.  No excessive fatigue or dyspnea.  EKG   EKG Interpretation  Date/Time:    Ventricular Rate:    PR Interval:    QRS Duration:   QT Interval:    QTC Calculation:   R Axis:     Text Interpretation:           Imaging Studies ordered: I ordered imaging studies including chest x-ray I independently visualized the following imaging with  scope of interpretation limited to determining acute life threatening conditions related to emergency care: no Acute process I independently visualized and interpreted imaging. I agree with the  radiologist interpretation   Medicines ordered and prescription drug management: Meds ordered this encounter  Medications   AND Linked Order Group    alum & mag hydroxide-simeth (MAALOX/MYLANTA) 200-200-20 MG/5ML suspension 30 mL    lidocaine (XYLOCAINE) 2 % viscous mouth solution 15 mL   famotidine (PEPCID) IVPB 20 mg premix   sodium chloride 0.9 % bolus 1,000 mL   sodium chloride 0.9 % bolus 1,000 mL   amLODipine (NORVASC) tablet 10 mg   atorvastatin (LIPITOR) tablet 40 mg   carvedilol (COREG) tablet 25 mg   levETIRAcetam (KEPPRA) tablet 500 mg   OR Linked Order Group    acetaminophen (TYLENOL) tablet 650 mg    acetaminophen (TYLENOL) suppository 650 mg    -I have reviewed the patients home medicines and have made adjustments as needed   Consultations Obtained: na   Cardiac Monitoring: The patient was maintained on a cardiac monitor.  I personally viewed and interpreted the cardiac monitored which showed an underlying rhythm of: nsr  Social Determinants of Health:  Diagnosis or treatment significantly limited by social determinants of health: former smoker   Reevaluation: After the interventions noted above, I reevaluated the patient and found that they have improved  Co morbidities that complicate the patient evaluation  Past Medical History:  Diagnosis Date   Anemia of chronic kidney failure, stage 4 (severe) ([place])    Diabetes mellitus type 2 in nonobese ([place]) 06/06/2015   Diabetes mellitus without complication ([place])    Diabetic neuropathy ([place]) 06/06/2015   Dyslipidemia 06/06/2015   Encephalopathy    Essential hypertension 06/06/2015   Hypertension    Mood disorder ([place]) 06/06/2015   Pneumonia 03/11/2017   Seizure ([place])    sees [name]. abd eeg, on keppra      Dispostion: Disposition decision including need for hospitalization was considered, and patient admitted to the hospital.    Final Clinical Impression(s) / ED Diagnoses Final diagnoses:  AKI  (acute kidney injury) ([place])  Chest pain, unspecified type     This chart was dictated using voice recognition software.  Despite best efforts to proofread,  errors can occur which can change the documentation meaning.    Jeanell Sparrow, DO 02/02/23 1757

## 2023-02-02 NOTE — H&P (Signed)
History and Physical    Tricia Huynh [id number] DOB: 02-10-1956 DOA: 02/02/2023  PCP: Jonathon Jordan, MD   Chief Complaint: cp  HPI: Tricia Huynh is a 67 y.o. female with medical history significant of type 2 diabetes, hyperlipidemia, CKD, heart failure who presented to the emergency department due to chest burning.  Patient states over the last week she has had reflux discomfort in her chest that is worse with lying down and after eating.  She presented to the ER for further assessment.  On arrival to the emergency department she was hypertensive with systolics in the [id number] afebrile hemodynamically stable.  Labs were which revealed bicarb 20, glucose 328, creatinine 4.4 baseline around 3, troponin 25,22 WBC 6.1, hemoglobin 7.3, BNP 1291.  Patient had chest x-ray which showed no acute cardiopulmonary disease.  Patient was given IV Pepcid, fluid boluses and admitted for further management. Of note patient was discharged in November of last year after presenting with hypertensive emergency and concern for seizure.  She was admitted to the ICU, started on antihypertensives and Keppra.  She was discharged 3 days later.   Patient has previously seen nephrology for her CKD.  Etiology of her kidney disease thought to be due to diabetes and hypertension.  On evaluation she is resting comfortably in bed.  She endorses a burning sensation in her chest particular with eating and worse with lying flat.  She denies any exertional component to her chest pain.  She denies infectious complaints including fever or chills.  She states she notes been gaining weight especially when she walks for extended periods of time.    Review of Systems: Review of Systems  All other systems reviewed and are negative.    As per HPI otherwise 10 point review of systems negative.   Allergies  Allergen Reactions   Percocet [Oxycodone-Acetaminophen] Nausea And Vomiting and Other (See Comments)     Causes the patient to be shaky/unsteady, also   Vicodin [Hydrocodone-Acetaminophen] Nausea And Vomiting and Other (See Comments)    Causes the patient to be shaky/unsteady, also    Past Medical History:  Diagnosis Date   Anemia of chronic kidney failure, stage 4 (severe) ([place])    Diabetes mellitus type 2 in nonobese ([place]) 06/06/2015   Diabetes mellitus without complication ([place])    Diabetic neuropathy ([place]) 06/06/2015   Dyslipidemia 06/06/2015   Encephalopathy    Essential hypertension 06/06/2015   Hypertension    Mood disorder ([place]) 06/06/2015   Pneumonia 03/11/2017   Seizure ([place])    sees [name]. abd eeg, on keppra    Past Surgical History:  Procedure Laterality Date   ABDOMINAL HYSTERECTOMY     EYE SURGERY Bilateral    TUBAL LIGATION       reports that she has quit smoking. Her smoking use included cigarettes. She has never used smokeless tobacco. She reports that she does not drink alcohol and does not use drugs.  Family History  Problem Relation Age of Onset   Heart disease Father    Kidney disease Father    Diabetes Mother    Seizures Sister    Seizures Brother     Prior to Admission medications   Medication Sig Start Date End Date Taking? Authorizing Provider  albuterol (VENTOLIN HFA) 108 (90 Base) MCG/ACT inhaler Inhale 1-2 puffs into the lungs every 6 (six) hours as needed for wheezing or shortness of breath. 10/26/22   Varney Biles, MD  amLODipine (NORVASC) 10 MG tablet Take 1 tablet (10  mg total) by mouth daily. 09/21/22 10/21/22  Antonieta Pert, MD  atorvastatin (LIPITOR) 40 MG tablet Take 40 mg by mouth daily. 12/23/19   [provider]  carvedilol (COREG) 25 MG tablet Take 1 tablet (25 mg total) by mouth 2 (two) times daily with a meal. 09/21/22 10/21/22  Antonieta Pert, MD  Continuous Blood Gluc Sensor (FREESTYLE LIBRE 3 SENSOR) MISC 1 each by Does not apply route 2 (two) times a week. Place 1 sensor on the skin every 14 days. Use to check glucose continuously  09/22/22   Antonieta Pert, MD  ferrous sulfate 325 (65 FE) MG tablet Take 1 tablet (325 mg total) by mouth daily with breakfast. 05/02/22   Raiford Noble Latif, DO  gabapentin (NEURONTIN) 100 MG capsule Take 1 capsule (100 mg total) by mouth at bedtime. 07/28/22   Danford, Suann Larry, MD  hydrALAZINE (APRESOLINE) 50 MG tablet Take 1 tablet (50 mg total) by mouth every 8 (eight) hours. 09/21/22 10/21/22  Antonieta Pert, MD  insulin glargine (LANTUS SOLOSTAR) 100 UNIT/ML Solostar Pen Inject 5 Units into the skin daily. Discard pen after 28 days from first use 07/28/22   Danford, Suann Larry, MD  Insulin Pen Needle (TECHLITE PEN NEEDLES) 32G X 4 MM MISC Use to inject lantus once a day 07/28/22   Danford, Suann Larry, MD  levETIRAcetam (KEPPRA) 500 MG tablet Take 1 tablet (500 mg total) by mouth 2 (two) times daily. 09/21/22 02/02/23  Antonieta Pert, MD  Multiple Vitamin (MULTIVITAMIN WITH MINERALS) TABS tablet Take 1 tablet by mouth daily. 05/02/22   Kerney Elbe, DO    Physical Exam: Vitals:   02/02/23 1830 02/02/23 1915 02/02/23 1930 02/02/23 1957  BP: (!) 184/105  (!) 177/104 (!) 177/99  Pulse: 80 78 77 78  Resp:  17 17 16   Temp:    98.2 F (36.8 C)  TempSrc:    Oral  SpO2: 98% 98% 98% 100%  Weight:      Height:       Physical Exam Vitals reviewed.  Constitutional:      Appearance: She is normal weight.  Cardiovascular:     Rate and Rhythm: Normal rate and regular rhythm.     Heart sounds: Normal heart sounds.  Pulmonary:     Effort: Pulmonary effort is normal.     Breath sounds: Normal breath sounds.  Abdominal:     General: Bowel sounds are normal.     Palpations: Abdomen is soft.  Musculoskeletal:        General: Normal range of motion.     Cervical back: Normal range of motion.  Skin:    General: Skin is warm.     Capillary Refill: Capillary refill takes less than 2 seconds.  Neurological:     General: No focal deficit present.     Mental Status: She is alert.       Labs  on Admission: I have personally reviewed the patients's labs and imaging studies.  Assessment/Plan Principal Problem:   AKI (acute kidney injury) ([place])   AKI on CKD 4, POA, active - Baseline creatinine around 3 and 4.4 on presentation In setting of numerous episodes of vomiting and probably dehydration. - Patient received 2 L IV fluids in the ER  Plan: Follow-up creatinine in morning If creatinine worsens will likely need nephrology consultation otherwise trending with conservative management  Chest pain most likely due to GERD - Patient presenting with chest pain that is worse with laying flat and with eating.  Troponins were downtrending 25,22.  Would recommend starting daily PPI  Anemia of chronic disease in setting of renal insufficiency, POA, active - Patient had issues with anemia with advanced kidney disease.  Hemoglobin at baseline as of several months ago.  No overt signs of GI blood loss Plan: Trend hemoglobin  Insulin-dependent diabetes with neuropathy-placed on sliding scale insulin   Hypertension-continue carvedilol, amlodipine  History of seizure-continue Keppra  Hyperlipidemia-continue Lipitor  Admission status: Inpatient Med-Surg  Certification: The appropriate patient status for this patient is INPATIENT. Inpatient status is judged to be reasonable and necessary in order to provide the required intensity of service to ensure the patient's safety. The patient's presenting symptoms, physical exam findings, and initial radiographic and laboratory data in the context of their chronic comorbidities is felt to place them at high risk for further clinical deterioration. Furthermore, it is not anticipated that the patient will be medically stable for discharge from the hospital within 2 midnights of admission.   * I certify that at the point of admission it is my clinical judgment that the patient will require inpatient hospital care spanning beyond 2 midnights from the  point of admission due to high intensity of service, high risk for further deterioration and high frequency of surveillance required.Emilee Hero MD [facility] If 7PM-7AM, please contact night-coverage [email or web address]  02/02/2023,2 L IV fluids in ER 7:58 PM

## 2023-02-03 ENCOUNTER — Inpatient Hospital Stay (HOSPITAL_COMMUNITY): Payer: 59

## 2023-02-03 DIAGNOSIS — N179 Acute kidney failure, unspecified: Secondary | ICD-10-CM | POA: Diagnosis not present

## 2023-02-03 LAB — GLUCOSE, CAPILLARY
Glucose-Capillary: 87 mg/dL (ref 70–99)
Glucose-Capillary: 109 mg/dL — ABNORMAL HIGH (ref 70–99)
Glucose-Capillary: 178 mg/dL — ABNORMAL HIGH (ref 70–99)
Glucose-Capillary: 130 mg/dL — ABNORMAL HIGH (ref 70–99)
Glucose-Capillary: 98 mg/dL (ref 70–99)

## 2023-02-03 LAB — URINALYSIS, ROUTINE W REFLEX MICROSCOPIC
Bacteria, UA: NONE SEEN
Bilirubin Urine: NEGATIVE
Glucose, UA: 150 mg/dL — AB
Hgb urine dipstick: NEGATIVE
Ketones, ur: NEGATIVE mg/dL
Nitrite: NEGATIVE
Protein, ur: >=300 mg/dL — AB
Specific Gravity, Urine: 1.014 (ref 1.005–1.030)
pH: 5 (ref 5.0–8.0)

## 2023-02-03 LAB — HEPATIC FUNCTION PANEL
ALT: 12 U/L (ref 0–44)
AST: 16 U/L (ref 15–41)
Albumin: 2.7 g/dL — ABNORMAL LOW (ref 3.5–5.0)
Alkaline Phosphatase: 88 U/L (ref 38–126)
Bilirubin, Direct: <0.1 mg/dL (ref 0.0–0.2)
Total Bilirubin: 0.5 mg/dL (ref 0.3–1.2)
Total Protein: 6.2 g/dL — ABNORMAL LOW (ref 6.5–8.1)

## 2023-02-03 LAB — BASIC METABOLIC PANEL
Anion gap: 4 — ABNORMAL LOW (ref 5–15)
BUN: 37 mg/dL — ABNORMAL HIGH (ref 8–23)
CO2: 16 mmol/L — ABNORMAL LOW (ref 22–32)
Calcium: 8 mg/dL — ABNORMAL LOW (ref 8.9–10.3)
Chloride: 116 mmol/L — ABNORMAL HIGH (ref 98–111)
Creatinine, Ser: 4.11 mg/dL — ABNORMAL HIGH (ref 0.44–1.00)
GFR, Estimated: 11 mL/min — ABNORMAL LOW (ref >60)
Glucose, Bld: 126 mg/dL — ABNORMAL HIGH (ref 70–99)
Potassium: 3.5 mmol/L (ref 3.5–5.1)
Sodium: 136 mmol/L (ref 135–145)

## 2023-02-03 LAB — SODIUM, URINE, RANDOM: Sodium, Ur: 85 mmol/L

## 2023-02-03 LAB — CREATININE, URINE, RANDOM: Creatinine, Urine: 81 mg/dL

## 2023-02-03 MED ORDER — SODIUM CHLORIDE 0.9 % IV SOLN: INTRAVENOUS | Status: DC

## 2023-02-03 NOTE — Consult Note (Signed)
Renal Service Consult Note [facility]  Tricia Huynh 02/03/2023 Sol Blazing, MD Requesting Physician: Dr. Jamse Arn  Reason for Consult: Renal failure HPI: The patient is a 67 y.o. year-old w/ PMH as below who presented to ED yesterday afternoon w/ c/o chest pain, lightheadedness, SOB lying flat. +GERD type heartburns w/ lying down and after eating. No exertional component. In ED labs Bp's were on the high side, HR wnl, RRwnl, afeb. WBC 6K, Hb 7.3, trop 25, 22.  Creat 4.4 (baseline 2-2.5). CXR was clear. Pt was given IV pepcid and fluid bolus x 2L and admitted. NS at 100 cc/hr was started. Today creat is 4.1, down a bit. We are asked to see for renal failure.   Pt is f/b Dr Carolin Sicks at [facility], since last year when she was admitted w/ renal failure. States she is supposed to get some "iron" at [facility] but they haven't caller her yet.  States she is supposed to see Dr Carolin Sicks next month again. Denies any N/V or change in voiding habits.    ROS -  no joint pain, no HA, no blurry vision, no rash, no diarrhea, no nausea/ vomiting, no dysuria, no difficulty voiding   Past Medical History  Past Medical History:  Diagnosis Date   Anemia of chronic kidney failure, stage 4 (severe) (HCC)    Diabetes mellitus type 2 in nonobese ([place]) 06/06/2015   Diabetes mellitus without complication ([place])    Diabetic neuropathy ([place]) 06/06/2015   Dyslipidemia 06/06/2015   Encephalopathy    Essential hypertension 06/06/2015   Hypertension    Mood disorder ([place]) 06/06/2015   Pneumonia 03/11/2017   Seizure ([place])    sees [name]. abd eeg, on keppra   Past Surgical History  Past Surgical History:  Procedure Laterality Date   ABDOMINAL HYSTERECTOMY     EYE SURGERY Bilateral    TUBAL LIGATION     Family History  Family History  Problem Relation Age of Onset   Heart disease Father    Kidney disease Father    Diabetes Mother    Seizures Sister    Seizures Brother     Social History  reports that she has quit smoking. Her smoking use included cigarettes. She has never used smokeless tobacco. She reports that she does not drink alcohol and does not use drugs. Allergies  Allergies  Allergen Reactions   Percocet [Oxycodone-Acetaminophen] Nausea And Vomiting and Other (See Comments)    Causes the patient to be shaky/unsteady, also   Vicodin [Hydrocodone-Acetaminophen] Nausea And Vomiting and Other (See Comments)    Causes the patient to be shaky/unsteady, also   Home medications Prior to Admission medications   Medication Sig Start Date End Date Taking? Authorizing Provider  amLODipine (NORVASC) 10 MG tablet Take 10 mg by mouth daily.   Yes [provider]  atorvastatin (LIPITOR) 40 MG tablet Take 40 mg by mouth daily. 12/23/19  Yes [provider]  carvedilol (COREG) 25 MG tablet Take 25 mg by mouth 2 (two) times daily with a meal.   Yes [provider]  gabapentin (NEURONTIN) 100 MG capsule Take 1 capsule (100 mg total) by mouth at bedtime. 07/28/22  Yes Danford, Suann Larry, MD  hydrALAZINE (APRESOLINE) 50 MG tablet Take 50 mg by mouth 3 (three) times daily.   Yes [provider]  insulin glargine (LANTUS SOLOSTAR) 100 UNIT/ML Solostar Pen Inject 5 Units into the skin daily. Discard pen after 28 days from first use Patient taking differently:  Inject 10 Units into the skin daily. 07/28/22  Yes Danford, Suann Larry, MD  levETIRAcetam (KEPPRA) 500 MG tablet Take 1 tablet (500 mg total) by mouth 2 (two) times daily. 09/21/22 02/02/23 Yes Antonieta Pert, MD  sodium bicarbonate 650 MG tablet Take 650 mg by mouth 2 (two) times daily.   Yes [provider]  Vitamin D, Ergocalciferol, (DRISDOL) 1.25 MG (50000 UNIT) CAPS capsule Take 50,000 Units by mouth every 7 (seven) days.   Yes [provider]  albuterol (VENTOLIN HFA) 108 (90 Base) MCG/ACT inhaler Inhale 1-2 puffs into the lungs every 6 (six) hours as needed for  wheezing or shortness of breath. Patient not taking: Reported on 02/02/2023 10/26/22   Varney Biles, MD  amLODipine (NORVASC) 10 MG tablet Take 1 tablet (10 mg total) by mouth daily. 09/21/22 10/21/22  Antonieta Pert, MD  carvedilol (COREG) 25 MG tablet Take 1 tablet (25 mg total) by mouth 2 (two) times daily with a meal. 09/21/22 10/21/22  Antonieta Pert, MD  Continuous Blood Gluc Sensor (FREESTYLE LIBRE 3 SENSOR) MISC 1 each by Does not apply route 2 (two) times a week. Place 1 sensor on the skin every 14 days. Use to check glucose continuously 09/22/22   Antonieta Pert, MD  ferrous sulfate 325 (65 FE) MG tablet Take 1 tablet (325 mg total) by mouth daily with breakfast. Patient not taking: Reported on 02/02/2023 05/02/22   Raiford Noble Latif, DO  hydrALAZINE (APRESOLINE) 50 MG tablet Take 1 tablet (50 mg total) by mouth every 8 (eight) hours. 09/21/22 10/21/22  Antonieta Pert, MD  Insulin Pen Needle (TECHLITE PEN NEEDLES) 32G X 4 MM MISC Use to inject lantus once a day 07/28/22   Danford, Suann Larry, MD  Multiple Vitamin (MULTIVITAMIN WITH MINERALS) TABS tablet Take 1 tablet by mouth daily. Patient not taking: Reported on 02/02/2023 05/02/22   Raiford Noble Latif, DO     Vitals:   02/03/23 0309 02/03/23 0701 02/03/23 0901 02/03/23 1154  BP: 139/77 139/85 (!) 142/82 127/77  Pulse: 72 72  70  Resp: 20 20  18   Temp: 98.9 F (37.2 C) 98.2 F (36.8 C)  98.1 F (36.7 C)  TempSrc: Oral Oral  Oral  SpO2: 99% 100%  99%  Weight:      Height:       Exam Gen alert, no distress, thin-framed AAF No rash, cyanosis or gangrene Sclera anicteric, throat clear  No jvd or bruits Chest clear bilat to bases, no rales/ wheezing RRR no MRG Abd soft ntnd no mass or ascites +bs GU defer MS no joint effusions or deformity Ext no LE or UE edema, no wounds or ulcers Neuro is alert, Ox 3 , nf, no asterixis   Home meds include - norvasc 10, coreg 25 bid, hydralazine 50 tid, sod bicarb 650 tid, vit D 50K weekly, lipitor,  lantus insulin , keppra , gabapentin 100 hs, prns/ vits/ supps    Date   Creat  eGFR   2019   0.80- 1.30   2020   1.10- 1.65   2021   1.84 --> 1.08  AKI    2022, jan  1.28 --> 1.08   AKI   mar-jun 2022  0.96- 1.27   july- sept 2022 1.06- 1.45   april-jun 2023 1.37- 1.97 28- 43 ml/min, ckd 3b     sept- dec 2023   2.19- 2.94 17- 24 ml/min, ckd 4    3/18   4.44     02/03/23  4.11  11 ml/min      ED - BP 174/ 95, RR 17 HR 79, temp 98     RA 99%    BP's today 127- 142/ 77- 82    UA- pend     UNa, UCr - pend       Assessment/ Plan: AKI on CKD 4 - b/l creat 2.2- 2.9 from late 2023, eGFR 17- 24 ml/min. Creat here was 4.4 on admission in the setting of recurrent CP likely GERD related. BP's were high. She rec'd 2 L bolus and creat is down to 4.1 this am. Not on acei/ ARB. Pt looks euvolemic to dry on exam. AKI may be related to GERD issues/ vol depletion vs ATN vs other. Need renal US, UA and urine lytes. Cont IVF's at 100 cc/hr and f/u labs in am. No signs of uremia. Will follow.  HTN - cont coreg/ norvasc, good bp control, per pmd Anemia esrd - Hb low, patient was to start IV iron as outpatient, will get records and possibly give some while here.  Chest pain - prob GERD per pmd.  IDDM - per pmd    Kelly Splinter  MD [facility] 02/03/2023, 4:38 PM  Recent Labs  Lab 02/02/23 1331 02/02/23 1351 02/03/23 0415  HGB  --  7.3*  --   CALCIUM 8.0*  --  8.0*  CREATININE 4.44*  --  4.11*  K 3.9  --  3.5   Inpatient medications:  amLODipine  10 mg Oral Daily   atorvastatin  40 mg Oral Daily   carvedilol  25 mg Oral BID WC   insulin aspart  0-24 Units Subcutaneous TID WC   levETIRAcetam  500 mg Oral BID   pantoprazole  40 mg Oral Daily    sodium chloride 100 mL/hr at 02/03/23 1051   acetaminophen **OR** acetaminophen, hydrALAZINE

## 2023-02-03 NOTE — TOC Initial Note (Signed)
Transition of Care [facility]) - Initial/Assessment Note    Patient Details  Name: Tricia Huynh MRN: [id number] Date of Birth: [birth year]  [facility]) CM/SW Contact:    Ninfa Meeker, RN Phone Number: 02/03/2023, 8:43 AM  Clinical Narrative:                  Transition of Care [facility]) Department has reviewed patient and no TOC needs have been identified at this time. We will continue to monitor patient advancement through Interdisciplinary progressions and if new patient needs arise, please place a consult.        Patient Goals and CMS Choice            Expected Discharge Plan and Services                                              Prior Living Arrangements/Services                       Activities of Daily Living Home Assistive Devices/Equipment: Eyeglasses, CBG Meter ADL Screening (condition at time of admission) Patient's cognitive ability adequate to safely complete daily activities?: Yes Is the patient deaf or have difficulty hearing?: No Does the patient have difficulty seeing, even when wearing glasses/contacts?: No Does the patient have difficulty concentrating, remembering, or making decisions?: No Patient able to express need for assistance with ADLs?: Yes Does the patient have difficulty dressing or bathing?: No Independently performs ADLs?: Yes (appropriate for developmental age) Does the patient have difficulty walking or climbing stairs?: Yes Weakness of Legs: Both Weakness of Arms/Hands: None  Permission Sought/Granted                  Emotional Assessment              Admission diagnosis:  AKI (acute kidney injury) ([place]) [N17.9] Chest pain, unspecified type [R07.9] Patient Active Problem List   Diagnosis Date Noted   AKI (acute kidney injury) ([place]) 02/02/2023   Hypertensive emergency 09/18/2022   Seizure ([place]) 09/18/2022   Encephalopathy acute 09/18/2022   Anemia of chronic kidney failure,  stage 4 (severe) ([place]) 07/27/2022   Hyponatremia 07/25/2022   Hypertensive crisis 07/23/2022   Hypokalemia 07/23/2022   Malnutrition of moderate degree 04/29/2022   Hyperglycemia due to type 2 diabetes mellitus ([place]) 04/27/2022   Focal neurological deficit 04/27/2022   Scalp cyst 04/27/2022   Chronic diastolic CHF (congestive heart failure) ([place]) 04/27/2022   AIN grade I 12/16/2021   External hemorrhoids with complication 0000000   Colitis 12/15/2020   HCAP (healthcare-associated pneumonia) 03/11/2017   Dyslipidemia 03/11/2017   Diabetes mellitus with complication ([place])    Chest pain 12/30/2016   Seizure disorder ([place]) 12/30/2016   Diabetic ketoacidosis without coma associated with type 2 diabetes mellitus ([place])    DKA (diabetic ketoacidoses) 11/15/2016   UTI (urinary tract infection) 11/15/2016   Acute renal failure superimposed on stage 3b chronic kidney disease ([place])    Hyperglycemia 11/14/2016   Diabetes ([place]) 06/05/2016   CKD (chronic kidney disease) stage 3, GFR 30-59 ml/min (HCC) 05/26/2016   Benign essential HTN 05/26/2016   Seizures ([place]) 01/08/2016   Memory loss 10/04/2015   Diabetic neuropathy ([place]) 06/06/2015   PCP:  Jonathon Jordan, MD Pharmacy:   [place], [place] - [address]  [address] [place] [address] Phone: [phone] Fax: [place] [address] Phone: [phone] Fax: [phone]  [facility] [id number] [place], [place] - [address] [place] [address] Phone: [phone] Fax: [phone]  [id number] - [place] [place] [address] Phone: [phone] Fax: [phone]     Social Determinants of Health (SDOH) Social History: [place]: No  Food Insecurity (02/02/2023)  Housing: Low Risk  (02/02/2023)  Transportation Needs: No Transportation Needs (02/02/2023)  Utilities: Not At Risk (02/02/2023)  Tobacco Use: Medium Risk (02/02/2023)   SDOH Interventions:     Readmission Risk Interventions    07/28/2022   10:31 AM  Readmission Risk Prevention Plan  Transportation Screening Complete  PCP or Specialist Appt within 3-5 Days Complete  HRI or [place] Complete  Social Work Consult for [place] Planning/Counseling Complete  Palliative Care Screening Not Applicable  Medication Review Press photographer) Complete

## 2023-02-03 NOTE — Progress Notes (Addendum)
PROGRESS NOTE    Tricia Huynh  [id number]  DOB: [birth year]  DOA: 02/02/2023 PCP: Tricia Jordan, MD Outpatient Specialists:   Hospital course:  67 year old with DM2 and CKD 4 was seen in ED yesterday for chest pain and nausea and vomiting thought to be secondary to GERD laboratory data was notable for elevated creatinine to 4.4 from a baseline of 2.8, now in CKD 5.  Acute elevation in creatinine was thought to be secondary to intravascular volume depletion from nausea and vomiting and decreased p.o. intake.  Subjective:  Patient states that she still feels weak although her chest pain is much better.  She has not had any vomitus for 24 hours.  Notes that she is not hungry at all does not want to eat.   Objective: Vitals:   02/03/23 0309 02/03/23 0701 02/03/23 0901 02/03/23 1154  BP: 139/77 139/85 (!) 142/82 127/77  Pulse: 72 72  70  Resp: 20 20  18   Temp: 98.9 F (37.2 C) 98.2 F (36.8 C)  98.1 F (36.7 C)  TempSrc: Oral Oral  Oral  SpO2: 99% 100%  99%  Weight:      Height:        Intake/Output Summary (Last 24 hours) at 02/03/2023 1218 Last data filed at 02/03/2023 0900 Gross per 24 hour  Intake 2245.95 ml  Output --  Net 2245.95 ml   Filed Weights   02/02/23 1329  Weight: 57.6 kg     Exam:  General: Chronically ill-appearing female sitting up in bed in NAD, a minimally eaten tray of breakfast is at bedside. Eyes: sclera anicteric, conjuctiva mild injection bilaterally CVS: S1-S2, regular  Respiratory:  decreased air entry bilaterally secondary to decreased inspiratory effort, rales at bases  GI: NABS, soft, NT  LE: Warm and well-perfused Neuro: A/O x 3,  grossly nonfocal.   Data Reviewed:  Basic Metabolic Panel: Recent Labs  Lab 02/02/23 1331 02/03/23 0415  NA 137 136  K 3.9 3.5  CL 110 116*  CO2 20* 16*  GLUCOSE 328* 126*  BUN 41* 37*  CREATININE 4.44* 4.11*  CALCIUM 8.0* 8.0*    CBC: Recent Labs  Lab 02/02/23 1351  WBC  6.1  NEUTROABS 4.1  HGB 7.3*  HCT 23.1*  MCV 92.0  PLT 204     Scheduled Meds:  amLODipine  10 mg Oral Daily   atorvastatin  40 mg Oral Daily   carvedilol  25 mg Oral BID WC   insulin aspart  0-24 Units Subcutaneous TID WC   levETIRAcetam  500 mg Oral BID   pantoprazole  40 mg Oral Daily   Continuous Infusions:  sodium chloride 100 mL/hr at 02/03/23 1051     Assessment & Plan:   Acute on chronic kidney disease CKD 4-5 Worsening of baseline renal function was thought to be secondary to intravascular volume depletion due to decreased p.o. intake and vomiting over the past week. Plan was to hydrate and recheck creatinine this morning, patient received 2 L bolus in ED however no further hydration overnight.  Creatinine this morning is still elevated at 4.1 Will start normal saline at 100 cc an hour Nephrology, Dr. Jonnie Finner, is consulted.  Chest pain/nausea/vomiting likely secondary to GERD Chest pain is resolved as is her nausea and vomiting with initiation of PPI Patient states she is not on any GERD medications at home, will need to continue PPI at discharge  DM 2 Blood sugars under reasonable control on present regimen Agree with holding home  doses of glargine given GFR of 10  Anemia H&H are stable, patient is hemodynamically stable EPO can be started per renal as warranted  HTN Well-controlled on carvedilol and amlodipine  Seizure disorder Continue Keppra   DVT prophylaxis: TED hose Code Status: Full Family Communication: None today   Studies: DG Chest 2 View  Result Date: 02/02/2023 CLINICAL DATA:  Chest pain, shortness of breath. EXAM: CHEST - 2 VIEW COMPARISON:  10/26/2022. FINDINGS: Clear lungs. Stable mild cardiomegaly and mediastinal contours. No pleural effusion or pneumothorax. Visualized bones and upper abdomen are unremarkable. IMPRESSION: No evidence of acute cardiopulmonary disease. Electronically Signed   By: Emmit Alexanders M.D.   On: 02/02/2023  14:01    Principal Problem:   AKI (acute kidney injury) ([place])     Rilee Wendling Derek Jack, [facility]  If 7PM-7AM, please contact night-coverage [email or web address]   LOS: 1 day

## 2023-02-04 DIAGNOSIS — N179 Acute kidney failure, unspecified: Secondary | ICD-10-CM | POA: Diagnosis not present

## 2023-02-04 LAB — CBC
HCT: 20.9 % — ABNORMAL LOW (ref 36.0–46.0)
Hemoglobin: 6.4 g/dL — CL (ref 12.0–15.0)
MCH: 29 pg (ref 26.0–34.0)
MCHC: 30.6 g/dL (ref 30.0–36.0)
MCV: 94.6 fL (ref 80.0–100.0)
Platelets: 183 10*3/uL (ref 150–400)
RBC: 2.21 MIL/uL — ABNORMAL LOW (ref 3.87–5.11)
RDW: 14.7 % (ref 11.5–15.5)
WBC: 4.9 10*3/uL (ref 4.0–10.5)
nRBC: 0 % (ref 0.0–0.2)

## 2023-02-04 LAB — GLUCOSE, CAPILLARY
Glucose-Capillary: 133 mg/dL — ABNORMAL HIGH (ref 70–99)
Glucose-Capillary: 170 mg/dL — ABNORMAL HIGH (ref 70–99)
Glucose-Capillary: 148 mg/dL — ABNORMAL HIGH (ref 70–99)
Glucose-Capillary: 191 mg/dL — ABNORMAL HIGH (ref 70–99)

## 2023-02-04 LAB — IRON AND TIBC
Iron: 44 ug/dL (ref 28–170)
Saturation Ratios: 18 % (ref 10.4–31.8)
TIBC: 251 ug/dL (ref 250–450)
UIBC: 207 ug/dL

## 2023-02-04 LAB — PREPARE RBC (CROSSMATCH)

## 2023-02-04 LAB — BASIC METABOLIC PANEL
Anion gap: 6 (ref 5–15)
BUN: 36 mg/dL — ABNORMAL HIGH (ref 8–23)
CO2: 17 mmol/L — ABNORMAL LOW (ref 22–32)
Calcium: 8.2 mg/dL — ABNORMAL LOW (ref 8.9–10.3)
Chloride: 116 mmol/L — ABNORMAL HIGH (ref 98–111)
Creatinine, Ser: 4.13 mg/dL — ABNORMAL HIGH (ref 0.44–1.00)
GFR, Estimated: 11 mL/min — ABNORMAL LOW (ref >60)
Glucose, Bld: 129 mg/dL — ABNORMAL HIGH (ref 70–99)
Potassium: 3.9 mmol/L (ref 3.5–5.1)
Sodium: 139 mmol/L (ref 135–145)

## 2023-02-04 LAB — HEMOGLOBIN AND HEMATOCRIT, BLOOD
HCT: 25.3 % — ABNORMAL LOW (ref 36.0–46.0)
Hemoglobin: 8.2 g/dL — ABNORMAL LOW (ref 12.0–15.0)

## 2023-02-04 LAB — RETICULOCYTES
Immature Retic Fract: 8.3 % (ref 2.3–15.9)
RBC.: 2.82 MIL/uL — ABNORMAL LOW (ref 3.87–5.11)
Retic Count, Absolute: 44.6 10*3/uL (ref 19.0–186.0)
Retic Ct Pct: 1.6 % (ref 0.4–3.1)

## 2023-02-04 LAB — FERRITIN: Ferritin: 89 ng/mL (ref 11–307)

## 2023-02-04 LAB — VITAMIN B12: Vitamin B-12: 420 pg/mL (ref 180–914)

## 2023-02-04 LAB — FOLATE: Folate: 6.5 ng/mL (ref >5.9)

## 2023-02-04 MED ORDER — SODIUM CHLORIDE 0.9% IV SOLUTION: Freq: Once | INTRAVENOUS | Status: AC

## 2023-02-04 NOTE — Progress Notes (Signed)
[facility] Progress Note  Subjective: UOP 350 yest and 470 cc today so far. Creat unchanged at 4.1 today. No N/V, voiding w/o complaints  Vitals:   02/04/23 0426 02/04/23 0426 02/04/23 1256 02/04/23 1311  BP:  (!) 170/95 (!) 149/92 (!) 149/93  Pulse:  73 70 70  Resp:  17 18 18   Temp:  98.1 F (36.7 C) 98.3 F (36.8 C) 98.3 F (36.8 C)  TempSrc:  Oral Oral Oral  SpO2:  100%  99%  Weight: 57 kg     Height:        Exam: Gen alert, no distress, thin-framed AAF No jvd or bruits Chest clear bilat to bases RRR no MRG Abd soft ntnd no mass or ascites +bs Ext no LE edema Neuro is alert, Ox 3 , nf, no asterixis    Home meds include - norvasc 10, coreg 25 bid, hydralazine 50 tid, sod bicarb 650 tid, vit D, lipitor, lantus insulin , keppra , gabapentin 100 hs, prns/ vits/ supps     Date                           Creat               eGFR   2019                          0.80- 1.30   2020                          1.10- 1.65   2021                          1.84 --> 1.08    AKI    2022, jan                   1.28 --> 1.08    AKI   mar-jun 2022             0.96- 1.27   july- sept 2022          1.06- 1.45   april-jun 2023            1.37- 1.97        28- 43 ml/min, ckd 3b                sept- dec 2023          2.19- 2.94        17- 24 ml/min, ckd 4      3/18                           4.44                    02/03/23                      4.11                 11 ml/min              BP's today 127- 142/ 77- 82    UA- prot > 300, 1.014, rbc 0-5, wbc 21-50, bact none     UNa 85, UCr 81    Renal US - 9.2/ 9.3 cm kidneys w/o hydronephrosis  CXR 3/18 - no acute disease   Assessment/ Plan: AKI on CKD 4 - b/l creat 2.2- 2.9 from late 2023, eGFR 17- 24 ml/min. Creat here was 4.4 on admission in the setting of recurrent CP likely GERD related. BP's were high. She rec'd 2 L bolus and creat came down to 4.1. Not on acei/ ARB. Pt looked euvolemic on initial exam. UA shows ^protein  + wbc's and renal US is w/o obstruction. Urine lytes c/w ATN and/or CKD. We cont'd IVF's at 100 cc/hr but creat is no better today. UOP marginal 300- 400 cc/d. AKI due to vol depletion vs ATN vs other. Will cont IVF"s and lower to 75 cc/hr. No indication for iHD yet. Will follow.  HTN - cont coreg/ norvasc, good bp control, per pmd Anemia esrd - Hb low, patient was to start IV iron as outpatient, will get records and possibly give some while here.  Chest pain - prob GERD per pmd.  IDDM - per pmd     Kelly Splinter MD [facility] 02/04/2023, 1:45 PM  Recent Labs  Lab 02/02/23 1351 02/03/23 0415 02/04/23 0350  HGB 7.3*  --  6.4*  ALBUMIN  --  2.7*  --   CALCIUM  --  8.0* 8.2*  CREATININE  --  4.11* 4.13*  K  --  3.5 3.9   No results for input(s): "IRON", "TIBC", "FERRITIN" in the last 168 hours. Inpatient medications:  amLODipine  10 mg Oral Daily   atorvastatin  40 mg Oral Daily   carvedilol  25 mg Oral BID WC   insulin aspart  0-24 Units Subcutaneous TID WC   levETIRAcetam  500 mg Oral BID   pantoprazole  40 mg Oral Daily    sodium chloride 100 mL/hr at 02/04/23 0648   acetaminophen **OR** acetaminophen, hydrALAZINE

## 2023-02-04 NOTE — Progress Notes (Signed)
PROGRESS NOTE    Tricia Huynh  [id number] DOB: 1956-04-10 DOA: 02/02/2023 PCP: Jonathon Jordan, MD   Brief Narrative: 67 year old with DM2 and CKD 4 was seen in ED yesterday for chest pain and nausea and vomiting thought to be secondary to GERD laboratory data was notable for elevated creatinine to 4.4 from a baseline of 2.8, now in CKD 5. Acute elevation in creatinine was thought to be secondary to intravascular volume depletion from nausea and vomiting and decreased p.o. intake.   Assessment & Plan:   Principal Problem:   AKI (acute kidney injury) ([place])   Acute on chronic kidney disease CKD 4-5 Worsening of baseline renal function was thought to be secondary to intravascular volume depletion due to decreased p.o. intake and vomiting over the past week. Plan was to hydrate and recheck creatinine this morning, patient received 2 L bolus in ED however no further hydration overnight.  Creatinine 4.13 from 4.11 in spite of being on fluids at 100 cc an hour overnight.   Chest pain/nausea/vomiting likely secondary to GERD Chest pain is resolved as is her nausea and vomiting with initiation of PPI Patient states she is not on any GERD medications at home, will need to continue PPI at discharge   DM 2-CBG (last 3)  Recent Labs    02/03/23 2135 02/04/23 0812 02/04/23 1151  GLUCAP 98 133* 170*   Last A1c is 10.3 from 05/06/2022 Recheck A1c Blood sugars under reasonable control on present regimen Agree with holding home doses of glargine given GFR of 10   Anemia-hemoglobin 6.4 from 7.3 EPO per renal  Anemia panel   HTN Well-controlled on carvedilol and amlodipine   Seizure disorder Continue Keppra  Estimated body mass index is 19.11 kg/m as calculated from the following:   Height as of this encounter: 5\' 8"  (1.727 m).   Weight as of this encounter: 57 kg.  DVT prophylaxis: none due to anemia Code Status: full Family Communication:  Disposition Plan:  Status  is: Inpatient Remains inpatient appropriate because: AKI   Consultants:  Nephrology  Procedures: None Antimicrobials: None  Subjective: She is resting in bed she denies any complaints Reports she is urinating as usual No nausea vomiting  Objective: Vitals:   02/03/23 2347 02/04/23 0425 02/04/23 0426 02/04/23 0426  BP: (!) 148/87   (!) 170/95  Pulse: 75   73  Resp: 17   17  Temp: 98.2 F (36.8 C)   98.1 F (36.7 C)  TempSrc: Oral   Oral  SpO2: 97%   100%  Weight:  57 kg 57 kg   Height:        Intake/Output Summary (Last 24 hours) at 02/04/2023 1122 Last data filed at 02/04/2023 0900 Gross per 24 hour  Intake 480 ml  Output 820 ml  Net -340 ml   Filed Weights   02/02/23 1329 02/04/23 0425 02/04/23 0426  Weight: 57.6 kg 57 kg 57 kg    Examination:  General exam: Appears  in nad Respiratory system: Clear to auscultation. Respiratory effort normal. Cardiovascular system: S1 & S2 heard, RRR. No JVD, murmurs, rubs, gallops or clicks. No pedal edema. Gastrointestinal system: Abdomen is nondistended, soft and nontender. No organomegaly or masses felt. Normal bowel sounds heard. Central nervous system: Alert and oriented. No focal neurological deficits. Extremities:  no edema  Skin: No rashes, lesions or ulcers Psychiatry: Judgement and insight appear normal. Mood & affect appropriate.     Data Reviewed: I have personally reviewed following labs and  imaging studies  CBC: Recent Labs  Lab 02/02/23 1351 02/04/23 0350  WBC 6.1 4.9  NEUTROABS 4.1  --   HGB 7.3* 6.4*  HCT 23.1* 20.9*  MCV 92.0 94.6  PLT 204 [id number]   Basic Metabolic Panel: Recent Labs  Lab 02/02/23 1331 02/03/23 0415 02/04/23 0350  NA 137 136 139  K 3.9 3.5 3.9  CL 110 116* 116*  CO2 20* 16* 17*  GLUCOSE 328* 126* 129*  BUN 41* 37* 36*  CREATININE 4.44* 4.11* 4.13*  CALCIUM 8.0* 8.0* 8.2*   GFR: Estimated Creatinine Clearance: 12.1 mL/min (A) (by C-G formula based on SCr of 4.13 mg/dL  (H)). Liver Function Tests: Recent Labs  Lab 02/03/23 0415  AST 16  ALT 12  ALKPHOS 88  BILITOT 0.5  PROT 6.2*  ALBUMIN 2.7*   No results for input(s): "LIPASE", "AMYLASE" in the last 168 hours. No results for input(s): "AMMONIA" in the last 168 hours. Coagulation Profile: No results for input(s): "INR", "PROTIME" in the last 168 hours. Cardiac Enzymes: No results for input(s): "CKTOTAL", "CKMB", "CKMBINDEX", "TROPONINI" in the last 168 hours. BNP (last 3 results) No results for input(s): "PROBNP" in the last 8760 hours. HbA1C: No results for input(s): "HGBA1C" in the last 72 hours. CBG: Recent Labs  Lab 02/03/23 0733 02/03/23 1155 02/03/23 1635 02/03/23 2135 02/04/23 0812  GLUCAP 109* 178* 130* 98 133*   Lipid Profile: No results for input(s): "CHOL", "HDL", "LDLCALC", "TRIG", "CHOLHDL", "LDLDIRECT" in the last 72 hours. Thyroid Function Tests: No results for input(s): "TSH", "T4TOTAL", "FREET4", "T3FREE", "THYROIDAB" in the last 72 hours. Anemia Panel: No results for input(s): "VITAMINB12", "FOLATE", "FERRITIN", "TIBC", "IRON", "RETICCTPCT" in the last 72 hours. Sepsis Labs: No results for input(s): "PROCALCITON", "LATICACIDVEN" in the last 168 hours.  No results found for this or any previous visit (from the past 240 hour(s)).       Radiology Studies: US RENAL  Result Date: 02/03/2023 CLINICAL DATA:  Acute renal failure superimposed on stage IV chronic renal disease. EXAM: RENAL / URINARY TRACT ULTRASOUND COMPLETE COMPARISON:  None Available. FINDINGS: Right Kidney: Renal measurements: 9.2 cm x 3.5 cm x 5.7 cm = volume: 95.0 mL. Diffusely increased echogenicity of the renal parenchyma is noted. A 0.9 cm x 0.8 cm x 0.8 cm simple cyst is seen within the upper pole of the right kidney. A 1.4 cm x 1.4 cm x 1.4 cm simple cyst is also seen within the lower pole of the right kidney. No hydronephrosis is visualized. Left Kidney: Renal measurements: 9.3 cm x 5.5 cm x 4.6  cm = volume: 125.1 mL. Diffusely increased echogenicity of the renal parenchyma is noted. A 1.1 cm x 0.9 cm x 0.9 cm simple cyst is seen in the upper pole of the left kidney. A 1.5 cm x 1.4 cm x 1.4 cm simple cyst is noted within the lower pole of the left kidney. No hydronephrosis is visualized. Bladder: Appears normal for degree of bladder distention. Other: None. IMPRESSION: 1. Echogenic renal parenchyma, likely secondary to medical renal disease. 2. Bilateral simple renal cysts. Electronically Signed   By: Virgina Norfolk M.D.   On: 02/03/2023 18:46   DG Chest 2 View  Result Date: 02/02/2023 CLINICAL DATA:  Chest pain, shortness of breath. EXAM: CHEST - 2 VIEW COMPARISON:  10/26/2022. FINDINGS: Clear lungs. Stable mild cardiomegaly and mediastinal contours. No pleural effusion or pneumothorax. Visualized bones and upper abdomen are unremarkable. IMPRESSION: No evidence of acute cardiopulmonary disease. Electronically Signed  By: Emmit Alexanders M.D.   On: 02/02/2023 14:01        Scheduled Meds:  amLODipine  10 mg Oral Daily   atorvastatin  40 mg Oral Daily   carvedilol  25 mg Oral BID WC   insulin aspart  0-24 Units Subcutaneous TID WC   levETIRAcetam  500 mg Oral BID   pantoprazole  40 mg Oral Daily   Continuous Infusions:  sodium chloride 100 mL/hr at 02/04/23 0648     LOS: 2 days    Time spent: 35 min  Georgette Shell, MD 02/04/2023, 11:22 AM

## 2023-02-05 DIAGNOSIS — N179 Acute kidney failure, unspecified: Secondary | ICD-10-CM | POA: Diagnosis not present

## 2023-02-05 LAB — BASIC METABOLIC PANEL
Anion gap: 5 (ref 5–15)
BUN: 39 mg/dL — ABNORMAL HIGH (ref 8–23)
CO2: 17 mmol/L — ABNORMAL LOW (ref 22–32)
Calcium: 8.3 mg/dL — ABNORMAL LOW (ref 8.9–10.3)
Chloride: 116 mmol/L — ABNORMAL HIGH (ref 98–111)
Creatinine, Ser: 4.43 mg/dL — ABNORMAL HIGH (ref 0.44–1.00)
GFR, Estimated: 10 mL/min — ABNORMAL LOW (ref >60)
Glucose, Bld: 140 mg/dL — ABNORMAL HIGH (ref 70–99)
Potassium: 4.1 mmol/L (ref 3.5–5.1)
Sodium: 138 mmol/L (ref 135–145)

## 2023-02-05 LAB — TYPE AND SCREEN
ABO/RH(D): O POS
Antibody Screen: POSITIVE
DAT, IgG: NEGATIVE
Donor AG Type: NEGATIVE
PT AG Type: NEGATIVE
Unit division: 0

## 2023-02-05 LAB — GLUCOSE, CAPILLARY
Glucose-Capillary: 147 mg/dL — ABNORMAL HIGH (ref 70–99)
Glucose-Capillary: 147 mg/dL — ABNORMAL HIGH (ref 70–99)
Glucose-Capillary: 195 mg/dL — ABNORMAL HIGH (ref 70–99)

## 2023-02-05 LAB — BPAM RBC
Blood Product Expiration Date: 202404242359
ISSUE DATE / TIME: 202403201247
Unit Type and Rh: 5100

## 2023-02-05 LAB — HEPATITIS B SURFACE ANTIGEN: Hepatitis B Surface Ag: NONREACTIVE

## 2023-02-05 MED ORDER — CHLORHEXIDINE GLUCONATE CLOTH 2 % EX PADS
6.0000 | MEDICATED_PAD | Freq: Every day | CUTANEOUS | Status: DC
  Administered 2023-02-06 – 2023-02-07 (×2): 6 via TOPICAL

## 2023-02-05 MED ORDER — SODIUM CHLORIDE 0.9 % IV SOLN
125.0000 mg | Freq: Every day | INTRAVENOUS | Status: AC
  Administered 2023-02-05 – 2023-02-06 (×2): 125 mg via INTRAVENOUS
  Filled 2023-02-05 (×2): qty 10

## 2023-02-05 NOTE — Progress Notes (Signed)
Attempted to call report to receiving nurse, Delana Meyer, was told she was in a patient room and would call me back.

## 2023-02-05 NOTE — Progress Notes (Signed)
PROGRESS NOTE    Tricia Huynh  [id number] DOB: 04/17/1956 DOA: 02/02/2023 PCP: Jonathon Jordan, MD   Brief Narrative: 67 year old with DM2 and CKD 4 was seen in ED yesterday for chest pain and nausea and vomiting thought to be secondary to GERD laboratory data was notable for elevated creatinine to 4.4 from a baseline of 2.8, now in CKD 5. Acute elevation in creatinine was thought to be secondary to intravascular volume depletion from nausea and vomiting and decreased p.o. intake.   Assessment & Plan:   Principal Problem:   AKI (acute kidney injury) ([place])   Acute on chronic kidney disease CKD 4-5 Worsening of baseline renal function was thought to be secondary to intravascular volume depletion due to decreased p.o. intake and vomiting over the past week. Creatinine 4.43 from 4.13 from 4.11 in spite of being on fluids at 75 cc an hour overnight.   Chest pain/nausea/vomiting likely secondary to GERD Chest pain is resolved as is her nausea and vomiting with initiation of PPI   DM 2-CBG (last 3) on ssi Recent Labs    02/04/23 2024 02/05/23 0753 02/05/23 1147  GLUCAP 191* 147* 147*    Last A1c is 10.3 from 05/06/2022 Recheck A1c pending  Blood sugars under reasonable control on present regimen Agree with holding home doses of glargine given GFR of 10   Anemia-hemoglobin 8.2 from 6.4 after 1 unit EPO per renal  Anemia panel  Iron 44 tibc 251    HTN Well-controlled on carvedilol and amlodipine   Seizure disorder Continue Keppra  Estimated body mass index is 19.54 kg/m as calculated from the following:   Height as of this encounter: 5\' 8"  (1.727 m).   Weight as of this encounter: 58.3 kg.  DVT prophylaxis: none due to anemia Code Status: full Family Communication:  Disposition Plan:  Status is: Inpatient Remains inpatient appropriate because: AKI   Consultants:  Nephrology  Procedures: None Antimicrobials: None  Subjective: C/o nausea since last pm   no vomiting Objective: Vitals:   02/04/23 1530 02/04/23 2021 02/05/23 0444 02/05/23 0500  BP: (!) 168/97 (!) 162/97 (!) 172/89   Pulse: 75 74 69   Resp: 18 18 18    Temp: 98.4 F (36.9 C) 98.6 F (37 C) 98.4 F (36.9 C)   TempSrc: Oral Oral Oral   SpO2: 100% 98% 100%   Weight:    58.3 kg  Height:        Intake/Output Summary (Last 24 hours) at 02/05/2023 1209 Last data filed at 02/05/2023 0959 Gross per 24 hour  Intake 796.83 ml  Output 1400 ml  Net -603.17 ml    Filed Weights   02/04/23 0425 02/04/23 0426 02/05/23 0500  Weight: 57 kg 57 kg 58.3 kg    Examination:  General exam: Appears  in nad Respiratory system: Clear to auscultation. Respiratory effort normal. Cardiovascular system: S1 & S2 heard, RRR. No JVD, murmurs, rubs, gallops or clicks. No pedal edema. Gastrointestinal system: Abdomen is nondistended, soft and nontender. No organomegaly or masses felt. Normal bowel sounds heard. Central nervous system: Alert and oriented. No focal neurological deficits. Extremities:  no edema  Skin: No rashes, lesions or ulcers Psychiatry: Judgement and insight appear normal. Mood & affect appropriate.     Data Reviewed: I have personally reviewed following labs and imaging studies  CBC: Recent Labs  Lab 02/02/23 1351 02/04/23 0350 02/04/23 1801  WBC 6.1 4.9  --   NEUTROABS 4.1  --   --   HGB  7.3* 6.4* 8.2*  HCT 23.1* 20.9* 25.3*  MCV 92.0 94.6  --   PLT 204 183  --     Basic Metabolic Panel: Recent Labs  Lab 02/02/23 1331 02/03/23 0415 02/04/23 0350 02/05/23 0419  NA 137 136 139 138  K 3.9 3.5 3.9 4.1  CL 110 116* 116* 116*  CO2 20* 16* 17* 17*  GLUCOSE 328* 126* 129* 140*  BUN 41* 37* 36* 39*  CREATININE 4.44* 4.11* 4.13* 4.43*  CALCIUM 8.0* 8.0* 8.2* 8.3*    GFR: Estimated Creatinine Clearance: 11.5 mL/min (A) (by C-G formula based on SCr of 4.43 mg/dL (H)). Liver Function Tests: Recent Labs  Lab 02/03/23 0415  AST 16  ALT 12  ALKPHOS 88   BILITOT 0.5  PROT 6.2*  ALBUMIN 2.7*    No results for input(s): "LIPASE", "AMYLASE" in the last 168 hours. No results for input(s): "AMMONIA" in the last 168 hours. Coagulation Profile: No results for input(s): "INR", "PROTIME" in the last 168 hours. Cardiac Enzymes: No results for input(s): "CKTOTAL", "CKMB", "CKMBINDEX", "TROPONINI" in the last 168 hours. BNP (last 3 results) No results for input(s): "PROBNP" in the last 8760 hours. HbA1C: No results for input(s): "HGBA1C" in the last 72 hours. CBG: Recent Labs  Lab 02/04/23 1151 02/04/23 1709 02/04/23 2024 02/05/23 0753 02/05/23 1147  GLUCAP 170* 148* 191* 147* 147*    Lipid Profile: No results for input(s): "CHOL", "HDL", "LDLCALC", "TRIG", "CHOLHDL", "LDLDIRECT" in the last 72 hours. Thyroid Function Tests: No results for input(s): "TSH", "T4TOTAL", "FREET4", "T3FREE", "THYROIDAB" in the last 72 hours. Anemia Panel: Recent Labs    02/04/23 1801  VITAMINB12 420  FOLATE 6.5  FERRITIN 89  TIBC 251  IRON 44  RETICCTPCT 1.6   Sepsis Labs: No results for input(s): "PROCALCITON", "LATICACIDVEN" in the last 168 hours.  No results found for this or any previous visit (from the past 240 hour(s)).       Radiology Studies: US RENAL  Result Date: 02/03/2023 CLINICAL DATA:  Acute renal failure superimposed on stage IV chronic renal disease. EXAM: RENAL / URINARY TRACT ULTRASOUND COMPLETE COMPARISON:  None Available. FINDINGS: Right Kidney: Renal measurements: 9.2 cm x 3.5 cm x 5.7 cm = volume: 95.0 mL. Diffusely increased echogenicity of the renal parenchyma is noted. A 0.9 cm x 0.8 cm x 0.8 cm simple cyst is seen within the upper pole of the right kidney. A 1.4 cm x 1.4 cm x 1.4 cm simple cyst is also seen within the lower pole of the right kidney. No hydronephrosis is visualized. Left Kidney: Renal measurements: 9.3 cm x 5.5 cm x 4.6 cm = volume: 125.1 mL. Diffusely increased echogenicity of the renal parenchyma is  noted. A 1.1 cm x 0.9 cm x 0.9 cm simple cyst is seen in the upper pole of the left kidney. A 1.5 cm x 1.4 cm x 1.4 cm simple cyst is noted within the lower pole of the left kidney. No hydronephrosis is visualized. Bladder: Appears normal for degree of bladder distention. Other: None. IMPRESSION: 1. Echogenic renal parenchyma, likely secondary to medical renal disease. 2. Bilateral simple renal cysts. Electronically Signed   By: Virgina Norfolk M.D.   On: 02/03/2023 18:46        Scheduled Meds:  amLODipine  10 mg Oral Daily   atorvastatin  40 mg Oral Daily   carvedilol  25 mg Oral BID WC   insulin aspart  0-24 Units Subcutaneous TID WC   levETIRAcetam  500  mg Oral BID   pantoprazole  40 mg Oral Daily   Continuous Infusions:  sodium chloride 75 mL/hr at 02/04/23 2128     LOS: 3 days    Time spent: 35 min  Georgette Shell, MD 02/05/2023, 12:09 PM

## 2023-02-05 NOTE — Progress Notes (Signed)
Pt leaving to [facility] at this time via [place]. Report called to [place] , Therapist, sports. Pt stable at time of departure.

## 2023-02-05 NOTE — Progress Notes (Signed)
Pt arrived from [place] to [place] at [place] today. Pt independently walked from stretcher to bed. Pt denied pain, and is on room air. BP during transfer was 180/107. This Probation officer, primary day RN Brayton Baumgartner rechecked pt's BP at 1904 and it was 172/98. This Probation officer administered PRN IV Hydralazine 10 mg at 1912. Pt aware she will be NPO at midnight for a dialysis catheter placement tomorrow, 02/06/2023. Pt oriented to unit, and fall precautions including call bell. Pt in NAD at this time.

## 2023-02-05 NOTE — Progress Notes (Signed)
[facility] Progress Note  Subjective: UOP 1000 L today but no improvement in creat. Pt states had N/V a few weeks ago, and has been nauseous here the last 24 hrs, also no appetite and low energy  Vitals:   02/04/23 2021 02/05/23 0444 02/05/23 0500 02/05/23 1231  BP: (!) 162/97 (!) 172/89  (!) 165/92  Pulse: 74 69  71  Resp: 18 18  17   Temp: 98.6 F (37 C) 98.4 F (36.9 C)  97.9 F (36.6 C)  TempSrc: Oral Oral    SpO2: 98% 100%  100%  Weight:   58.3 kg   Height:        Exam: Gen alert, no distress, thin-framed AAF No jvd or bruits Chest clear bilat to bases RRR no MRG Abd soft ntnd no mass or ascites +bs Ext no LE edema Neuro is alert, Ox 3 , nf, no asterixis    Home meds include - norvasc 10, coreg 25 bid, hydralazine 50 tid, sod bicarb 650 tid, vit D, lipitor, lantus insulin , keppra , gabapentin 100 hs, prns/ vits/ supps     Date                           Creat               eGFR   2019                          0.80- 1.30   2020                          1.10- 1.65   2021                          1.84 --> 1.08    AKI    2022, jan                   1.28 --> 1.08    AKI   mar-jun 2022             0.96- 1.27   july- sept 2022          1.06- 1.45   april-jun 2023            1.37- 1.97        28- 43 ml/min, ckd 3b                sept- dec 2023          2.19- 2.94        17- 24 ml/min, ckd 4      3/18                           4.44                    02/03/23                      4.11                 11 ml/min              BP's today 127- 142/ 77- 82    UA- prot > 300, 1.014, rbc 0-5, wbc 21-50, bact none     UNa 85, UCr 81  Renal US - 9.2/ 9.3 cm kidneys w/o hydronephrosis      CXR 3/18 - no acute disease   Assessment/ Plan: AKI on CKD 4 - b/l creat 2.2- 2.9 from late 2023, eGFR 17- 24 ml/min. Creat here was 4.4 on admission in the setting of recurrent CP likely GERD related. BP's were high. She rec'd 2 L bolus and creat came down to 4.1. Not on acei/  ARB. Pt looked euvolemic on initial exam. UA shows ^protein + wbc's and renal US is w/o obstruction. Urine lytes c/w ATN and/or CKD. We cont'd IVF's at 100 cc/hr but creat is no better today. UOP okay but creat not responding to IVF's. She has uremic sx's (intermittent n/v, anorexia, fatigue) and eGFR < 12 ml/min. Have d/w Dr Carolin Sicks (primary nephrology) and we will plan to start dialysis. Pt is agreeable. Will consult IR for HD cath and plan 1st HD Friday/ Sat.  HTN - cont coreg/ norvasc Anemia esrd - Hb low. Tsat 18% which is low, will start IV iron and darbe 40 mcg sq weekly for now.  Chest pain - prob GERD per pmd.  IDDM - per pmd     Kelly Splinter MD [facility] 02/05/2023, 3:08 PM  Recent Labs  Lab 02/03/23 0415 02/04/23 0350 02/04/23 1801 02/05/23 0419  HGB  --  6.4* 8.2*  --   ALBUMIN 2.7*  --   --   --   CALCIUM 8.0* 8.2*  --  8.3*  CREATININE 4.11* 4.13*  --  4.43*  K 3.5 3.9  --  4.1    Recent Labs  Lab 02/04/23 1801  IRON 44  TIBC 251  FERRITIN 89   Inpatient medications:  amLODipine  10 mg Oral Daily   atorvastatin  40 mg Oral Daily   carvedilol  25 mg Oral BID WC   [START ON 02/06/2023] Chlorhexidine Gluconate Cloth  6 each Topical Q0600   insulin aspart  0-24 Units Subcutaneous TID WC   levETIRAcetam  500 mg Oral BID   pantoprazole  40 mg Oral Daily     acetaminophen **OR** acetaminophen, hydrALAZINE

## 2023-02-05 NOTE — Progress Notes (Signed)
Spoke with Tricia Huynh at [place]. All necessary information provided. States cannot give me a time, but she will be picked up tonight to take to [facility].

## 2023-02-06 ENCOUNTER — Inpatient Hospital Stay (HOSPITAL_COMMUNITY): Payer: 59

## 2023-02-06 DIAGNOSIS — I10 Essential (primary) hypertension: Secondary | ICD-10-CM | POA: Diagnosis not present

## 2023-02-06 DIAGNOSIS — R569 Unspecified convulsions: Secondary | ICD-10-CM | POA: Diagnosis not present

## 2023-02-06 DIAGNOSIS — E0849 Diabetes mellitus due to underlying condition with other diabetic neurological complication: Secondary | ICD-10-CM

## 2023-02-06 DIAGNOSIS — N179 Acute kidney failure, unspecified: Secondary | ICD-10-CM | POA: Diagnosis not present

## 2023-02-06 HISTORY — PX: IR US GUIDE VASC ACCESS RIGHT: IMG2390

## 2023-02-06 HISTORY — PX: IR FLUORO GUIDE CV LINE RIGHT: IMG2283

## 2023-02-06 LAB — BASIC METABOLIC PANEL
Anion gap: 8 (ref 5–15)
BUN: 37 mg/dL — ABNORMAL HIGH (ref 8–23)
CO2: 15 mmol/L — ABNORMAL LOW (ref 22–32)
Calcium: 8.6 mg/dL — ABNORMAL LOW (ref 8.9–10.3)
Chloride: 117 mmol/L — ABNORMAL HIGH (ref 98–111)
Creatinine, Ser: 4.13 mg/dL — ABNORMAL HIGH (ref 0.44–1.00)
GFR, Estimated: 11 mL/min — ABNORMAL LOW (ref >60)
Glucose, Bld: 143 mg/dL — ABNORMAL HIGH (ref 70–99)
Potassium: 3.9 mmol/L (ref 3.5–5.1)
Sodium: 140 mmol/L (ref 135–145)

## 2023-02-06 LAB — GLUCOSE, CAPILLARY
Glucose-Capillary: 201 mg/dL — ABNORMAL HIGH (ref 70–99)
Glucose-Capillary: 119 mg/dL — ABNORMAL HIGH (ref 70–99)
Glucose-Capillary: 78 mg/dL (ref 70–99)
Glucose-Capillary: 138 mg/dL — ABNORMAL HIGH (ref 70–99)

## 2023-02-06 LAB — HEMOGLOBIN A1C
Hgb A1c MFr Bld: 7 % — ABNORMAL HIGH (ref 4.8–5.6)
Mean Plasma Glucose: 154 mg/dL

## 2023-02-06 MED ORDER — MIDAZOLAM HCL 2 MG/2ML IJ SOLN
INTRAMUSCULAR | Status: AC
  Filled 2023-02-06: qty 2

## 2023-02-06 MED ORDER — MIDAZOLAM HCL 2 MG/2ML IJ SOLN
INTRAMUSCULAR | Status: AC | PRN
  Administered 2023-02-06: 1 mg via INTRAVENOUS

## 2023-02-06 MED ORDER — HYDROMORPHONE HCL 1 MG/ML IJ SOLN
0.5000 mg | INTRAMUSCULAR | Status: DC | PRN
  Administered 2023-02-06 – 2023-02-11 (×6): 0.5 mg via INTRAVENOUS
  Filled 2023-02-06 (×6): qty 0.5

## 2023-02-06 MED ORDER — HEPARIN SODIUM (PORCINE) 1000 UNIT/ML IJ SOLN
3800.0000 [IU] | Freq: Once | INTRAMUSCULAR | Status: AC
  Administered 2023-02-06: 3800 [IU]
  Filled 2023-02-06: qty 4

## 2023-02-06 MED ORDER — NALOXONE HCL 0.4 MG/ML IJ SOLN: 0.4000 mg | INTRAMUSCULAR | Status: DC | PRN

## 2023-02-06 MED ORDER — CEFAZOLIN SODIUM-DEXTROSE 2-4 GM/100ML-% IV SOLN: 2.0000 g | INTRAVENOUS | Status: AC

## 2023-02-06 MED ORDER — HEPARIN SODIUM (PORCINE) 1000 UNIT/ML DIALYSIS: 2000.0000 [IU] | INTRAMUSCULAR | Status: DC | PRN

## 2023-02-06 MED ORDER — FENTANYL CITRATE (PF) 100 MCG/2ML IJ SOLN
INTRAMUSCULAR | Status: AC
  Filled 2023-02-06: qty 2

## 2023-02-06 MED ORDER — ONDANSETRON HCL 4 MG/2ML IJ SOLN
4.0000 mg | Freq: Four times a day (QID) | INTRAMUSCULAR | Status: DC | PRN
  Administered 2023-02-07 – 2023-02-11 (×3): 4 mg via INTRAVENOUS
  Filled 2023-02-06 (×2): qty 2

## 2023-02-06 MED ORDER — SODIUM CHLORIDE 0.9 % IV SOLN
INTRAVENOUS | Status: AC | PRN
  Administered 2023-02-06: 10 mL/h via INTRAVENOUS

## 2023-02-06 MED ORDER — FENTANYL CITRATE (PF) 100 MCG/2ML IJ SOLN
INTRAMUSCULAR | Status: AC | PRN
  Administered 2023-02-06: 50 ug via INTRAVENOUS

## 2023-02-06 MED ORDER — CHLORHEXIDINE GLUCONATE CLOTH 2 % EX PADS
6.0000 | MEDICATED_PAD | Freq: Every day | CUTANEOUS | Status: DC
  Administered 2023-02-08: 6 via TOPICAL

## 2023-02-06 MED ORDER — CEFAZOLIN SODIUM-DEXTROSE 2-4 GM/100ML-% IV SOLN
INTRAVENOUS | Status: AC
  Administered 2023-02-06: 2 g via INTRAVENOUS
  Filled 2023-02-06: qty 100

## 2023-02-06 MED ORDER — LIDOCAINE-EPINEPHRINE 1 %-1:100000 IJ SOLN
INTRAMUSCULAR | Status: AC
  Administered 2023-02-06: 10 mL
  Filled 2023-02-06: qty 1

## 2023-02-06 MED ORDER — HEPARIN SODIUM (PORCINE) 1000 UNIT/ML IJ SOLN
INTRAMUSCULAR | Status: AC
  Filled 2023-02-06: qty 10

## 2023-02-06 NOTE — Consult Note (Addendum)
Hospital Consult    Reason for Consult:  dialysis access Requesting Physician:  Dr. Jonnie Finner MRN #:  [id number]  History of Present Illness: This is a 67 y.o. female being seen in consultation for evaluation for permanent dialysis access placement.  She has longstanding CKD now with end-stage renal disease.  She underwent [facility] placement with interventional radiology this morning and is currently receiving her first HD treatment via right IJ TDC.  She is right arm dominant and would prefer placement of permanent access in her left arm.  She does not take any blood thinners.  She does not have a pacemaker.  She has not had any vascular surgery on either arm.  Past Medical History:  Diagnosis Date   Anemia of chronic kidney failure, stage 4 (severe) (HCC)    Diabetes mellitus type 2 in nonobese ([place]) 06/06/2015   Diabetes mellitus without complication ([place])    Diabetic neuropathy ([place]) 06/06/2015   Dyslipidemia 06/06/2015   Encephalopathy    Essential hypertension 06/06/2015   Hypertension    Mood disorder ([place]) 06/06/2015   Pneumonia 03/11/2017   Seizure ([place])    sees [name]. abd eeg, on keppra    Past Surgical History:  Procedure Laterality Date   ABDOMINAL HYSTERECTOMY     EYE SURGERY Bilateral    IR FLUORO GUIDE CV LINE RIGHT  02/06/2023   IR US GUIDE VASC ACCESS RIGHT  02/06/2023   TUBAL LIGATION      Allergies  Allergen Reactions   Percocet [Oxycodone-Acetaminophen] Nausea And Vomiting and Other (See Comments)    Causes the patient to be shaky/unsteady, also   Vicodin [Hydrocodone-Acetaminophen] Nausea And Vomiting and Other (See Comments)    Causes the patient to be shaky/unsteady, also    Prior to Admission medications   Medication Sig Start Date End Date Taking? Authorizing Provider  amLODipine (NORVASC) 10 MG tablet Take 10 mg by mouth daily.   Yes [provider]  atorvastatin (LIPITOR) 40 MG tablet Take 40 mg by mouth daily. 12/23/19  Yes [provider]  carvedilol (COREG) 25 MG tablet Take 25 mg by mouth 2 (two) times daily with a meal.   Yes [provider]  gabapentin (NEURONTIN) 100 MG capsule Take 1 capsule (100 mg total) by mouth at bedtime. 07/28/22  Yes Danford, Suann Larry, MD  hydrALAZINE (APRESOLINE) 50 MG tablet Take 50 mg by mouth 3 (three) times daily.   Yes [provider]  insulin glargine (LANTUS SOLOSTAR) 100 UNIT/ML Solostar Pen Inject 5 Units into the skin daily. Discard pen after 28 days from first use Patient taking differently: Inject 10 Units into the skin daily. 07/28/22  Yes Danford, Suann Larry, MD  levETIRAcetam (KEPPRA) 500 MG tablet Take 1 tablet (500 mg total) by mouth 2 (two) times daily. 09/21/22 02/02/23 Yes Antonieta Pert, MD  sodium bicarbonate 650 MG tablet Take 650 mg by mouth 2 (two) times daily.   Yes [provider]  Vitamin D, Ergocalciferol, (DRISDOL) 1.25 MG (50000 UNIT) CAPS capsule Take 50,000 Units by mouth every 7 (seven) days.   Yes [provider]  albuterol (VENTOLIN HFA) 108 (90 Base) MCG/ACT inhaler Inhale 1-2 puffs into the lungs every 6 (six) hours as needed for wheezing or shortness of breath. Patient not taking: Reported on 02/02/2023 10/26/22   Varney Biles, MD  amLODipine (NORVASC) 10 MG tablet Take 1 tablet (10 mg total) by mouth daily. 09/21/22 10/21/22  Antonieta Pert, MD  carvedilol (COREG) 25 MG tablet Take 1  tablet (25 mg total) by mouth 2 (two) times daily with a meal. 09/21/22 10/21/22  Antonieta Pert, MD  Continuous Blood Gluc Sensor (FREESTYLE LIBRE 3 SENSOR) MISC 1 each by Does not apply route 2 (two) times a week. Place 1 sensor on the skin every 14 days. Use to check glucose continuously 09/22/22   Antonieta Pert, MD  ferrous sulfate 325 (65 FE) MG tablet Take 1 tablet (325 mg total) by mouth daily with breakfast. Patient not taking: Reported on 02/02/2023 05/02/22   Raiford Noble Latif, DO  hydrALAZINE (APRESOLINE) 50 MG tablet Take 1 tablet (50 mg total) by  mouth every 8 (eight) hours. 09/21/22 10/21/22  Antonieta Pert, MD  Insulin Pen Needle (TECHLITE PEN NEEDLES) 32G X 4 MM MISC Use to inject lantus once a day 07/28/22   Danford, Suann Larry, MD  Multiple Vitamin (MULTIVITAMIN WITH MINERALS) TABS tablet Take 1 tablet by mouth daily. Patient not taking: Reported on 02/02/2023 05/02/22   Kerney Elbe, DO    Social History   Socioeconomic History   Marital status: Single    Spouse name: Not on file   Number of children: 3   Years of education: 10   Highest education level: Not on file  Occupational History   Occupation: Disabled  Tobacco Use   Smoking status: Former    Types: Cigarettes   Smokeless tobacco: Never  Vaping Use   Vaping Use: Never used  Substance and Sexual Activity   Alcohol use: No   Drug use: No   Sexual activity: Yes  Other Topics Concern   Not on file  Social History Narrative   Lives at home with her grandchildren.   Right-handed.   No caffeine use.   Social Determinants of Health   Financial Resource Strain: Not on file  Food Insecurity: No Food Insecurity (02/02/2023)   Hunger Vital Sign    Worried About Running Out of Food in the Last Year: Never true    Ran Out of Food in the Last Year: Never true  Transportation Needs: No Transportation Needs (02/02/2023)   PRAPARE - Hydrologist (Medical): No    Lack of Transportation (Non-Medical): No  Physical Activity: Not on file  Stress: Not on file  Social Connections: Not on file  Intimate Partner Violence: Not At Risk (02/02/2023)   Humiliation, Afraid, Rape, and Kick questionnaire    Fear of Current or Ex-Partner: No    Emotionally Abused: No    Physically Abused: No    Sexually Abused: No     Family History  Problem Relation Age of Onset   Heart disease Father    Kidney disease Father    Diabetes Mother    Seizures Sister    Seizures Brother     ROS: Otherwise negative unless mentioned in HPI  Physical  Examination  Vitals:   02/06/23 1430 02/06/23 1440  BP: (!) 160/93 (!) 156/96  Pulse: 69 66  Resp: 15 16  Temp:    SpO2: 97% 97%   Body mass index is 19.48 kg/m.  General:  WDWN in NAD Gait: Not observed HENT: WNL, normocephalic Pulmonary: normal non-labored breathing, without Rales, rhonchi,  wheezing Cardiac: regular Abdomen:  soft, NT/ND, no masses Skin: without rashes Vascular Exam/Pulses: symmetrical radial pulse Extremities: without ischemic changes, without Gangrene , without cellulitis; without open wounds;  Musculoskeletal: no muscle wasting or atrophy  Neurologic: A&O X 3;  No focal weakness or paresthesias are detected; speech is  fluent/normal Psychiatric:  The pt has Normal affect. Lymph:  Unremarkable  CBC    Component Value Date/Time   WBC 4.9 02/04/2023 0350   RBC 2.82 (L) 02/04/2023 1801   RBC 2.21 (L) 02/04/2023 0350   HGB 8.2 (L) 02/04/2023 1801   HCT 25.3 (L) 02/04/2023 1801   PLT 183 02/04/2023 0350   MCV 94.6 02/04/2023 0350   MCH 29.0 02/04/2023 0350   MCHC 30.6 02/04/2023 0350   RDW 14.7 02/04/2023 0350   LYMPHSABS 1.5 02/02/2023 1351   MONOABS 0.5 02/02/2023 1351   EOSABS 0.0 02/02/2023 1351   BASOSABS 0.0 02/02/2023 1351    BMET    Component Value Date/Time   NA 140 02/06/2023 0604   K 3.9 02/06/2023 0604   CL 117 (H) 02/06/2023 0604   CO2 15 (L) 02/06/2023 0604   GLUCOSE 143 (H) 02/06/2023 0604   BUN 37 (H) 02/06/2023 0604   CREATININE 4.13 (H) 02/06/2023 0604   CALCIUM 8.6 (L) 02/06/2023 0604   GFRNONAA 11 (L) 02/06/2023 0604   GFRAA >60 08/20/2020 0020    COAGS: Lab Results  Component Value Date   INR 1.2 07/25/2022   INR 1.02 01/22/2018   INR 1.00 06/06/2015     Non-Invasive Vascular Imaging:   Vein mapping pending    ASSESSMENT/PLAN: This is a 67 y.o. female with end-stage renal disease on hemodialysis being seen in consultation for evaluation for permanent dialysis access  -Ms. Christia Reading was evaluated while  receiving her first hemodialysis treatment.  She underwent right IJ [facility] placement with interventional radiology this morning and this seems to be working well.  She is right arm dominant and would prefer access placement in her left arm.  We will restrict her left upper extremity for now.  Vein mapping is pending.  There is limited availability on the OR schedule next week however we will try to place left arm permanent access next week if possible.  If she is discharged prior to surgery date, this can be arranged as an outpatient.  On-call vascular surgeon Dr. Virl Cagey also evaluated the patient today and will provide further treatment plans   Dagoberto Ligas PA-C [facility] [phone]   VASCULAR STAFF ADDENDUM: I have independently interviewed and examined the patient. I agree with the above.  Will follow up vein mapping. Likely outpt fistula v AV graft placement due to limited OR availability next week by me and my partners.   Cassandria Santee, MD [facility] Phone Number: [phone] 02/06/2023 11:33 PM

## 2023-02-06 NOTE — Progress Notes (Signed)
PROGRESS NOTE    Tricia Huynh  [id number] DOB: October 20, 1956 DOA: 02/02/2023 PCP: Jonathon Jordan, MD    Brief Narrative:  Patient is a 67 years old female with past medical history of type 2 diabetes, CKD stage IV presented to hospital with chest pain, nausea vomiting.  Initial thought to be GERD but labs showed creatinine of 4.4 from baseline 2.8.  Acute elevation in creatinine was thought to be secondary intravascular depletion from nausea vomiting and decreased oral intake.  Patient was then admitted hospital for further evaluation and treatment.   Assessment and Plan:  Acute on chronic kidney disease CKD 4-5 Thought to be secondary to intravascular volume depletion.  Baseline creatinine of 2.2-2.9 from 2023.  Creatinine today at 4.1.  Urine lites were consistent with ATN and not CKD.  Received IV fluids.  Patient did have good urinary output but no improvement in creatinine.  Patient continued to have nausea with low appetite and energy.  Since patient had uremic symptoms nephrology  IR was consulted for hemodialysis catheter placement and initiation of hemodialysis..   Chest pain/nausea/vomiting likely secondary to GERD On PPI.  Will continue.  Denies active chest pain.   DM 2.  Continue sliding scale insulin.  Latest hemoglobin A1c of 7.0 from 02/05/2023. Continue sliding scale.  Holding long-acting from home.   Anemia likely secondary to chronic kidney disease. -hemoglobin 8.2 from 6.4 after 1 unit.  Check CBC in AM.  IV iron/EPO as per nephrology.  Anemia panel showed iron of 44 with TIBC of 251.    HTN Of Coreg and amlodipine.  Elevated.  Continue to monitor closely.   Seizure disorder Continue Keppra    DVT prophylaxis: Place TED hose Start: 02/02/23 1720   Code Status:     Code Status: Full Code  Disposition: Home likely more than 3 days.  Status is: Inpatient Remains inpatient appropriate because: Uremia, need for hemodialysis,   Family Communication:  None at bedside.  Consultants:  Nephrology  Procedures:  Plan for hemodialysis after hemodialysis catheter placement.  Antimicrobials:  None  Anti-infectives (From admission, onward)    None       Subjective: Today, patient was seen and examined at bedside.  Patient denies any nausea or vomiting.  Has some mild weakness with decreased appetite.  Denies any chest pain or shortness of breath today.  Objective: Vitals:   02/05/23 1231 02/05/23 1904 02/06/23 0000 02/06/23 0500  BP: (!) 165/92 (!) 172/98 (!) 154/80   Pulse: 71 76 78   Resp: 17 18 17    Temp: 97.9 F (36.6 C)  98.1 F (36.7 C)   TempSrc:   Oral   SpO2: 100% 97% 93%   Weight:    58.4 kg  Height:        Intake/Output Summary (Last 24 hours) at 02/06/2023 0817 Last data filed at 02/06/2023 0500 Gross per 24 hour  Intake 476 ml  Output 1450 ml  Net -974 ml   Filed Weights   02/04/23 0426 02/05/23 0500 02/06/23 0500  Weight: 57 kg 58.3 kg 58.4 kg    Physical Examination: Body mass index is 19.48 kg/m.   General: Thinly built, not in obvious distress, alert awake and oriented HENT:   No scleral pallor or icterus noted. Oral mucosa is moist.  Chest:    Diminished breath sounds bilaterally. No crackles or wheezes.  Right chest wall hemodialysis catheter in place. CVS: S1 &S2 heard. No murmur.  Regular rate and rhythm. Abdomen: Soft, nontender,  nondistended.  Bowel sounds are heard.   Extremities: No cyanosis, clubbing or edema.  Peripheral pulses are palpable. Psych: Alert, awake and oriented, normal mood CNS:  No cranial nerve deficits.  Generalized weakness noted Skin: Warm and dry.  No rashes noted.  Data Reviewed:   CBC: Recent Labs  Lab 02/02/23 1351 02/04/23 0350 02/04/23 1801  WBC 6.1 4.9  --   NEUTROABS 4.1  --   --   HGB 7.3* 6.4* 8.2*  HCT 23.1* 20.9* 25.3*  MCV 92.0 94.6  --   PLT 204 183  --     Basic Metabolic Panel: Recent Labs  Lab 02/02/23 1331 02/03/23 0415  02/04/23 0350 02/05/23 0419 02/06/23 0604  NA 137 136 139 138 140  K 3.9 3.5 3.9 4.1 3.9  CL 110 116* 116* 116* 117*  CO2 20* 16* 17* 17* 15*  GLUCOSE 328* 126* 129* 140* 143*  BUN 41* 37* 36* 39* 37*  CREATININE 4.44* 4.11* 4.13* 4.43* 4.13*  CALCIUM 8.0* 8.0* 8.2* 8.3* 8.6*    Liver Function Tests: Recent Labs  Lab 02/03/23 0415  AST 16  ALT 12  ALKPHOS 88  BILITOT 0.5  PROT 6.2*  ALBUMIN 2.7*     Radiology Studies: No results found.    LOS: 4 days    Flora Lipps, MD [facility] Available via Epic secure chat 7am-7pm After these hours, please refer to coverage provider listed on [email or web address] 02/06/2023, 8:17 AM

## 2023-02-06 NOTE — Progress Notes (Signed)
Received patient in bed to unit.  Alert and oriented.  Informed consent signed and in chart.   Joppa duration:2.75  Patient tolerated well.  Transported back to the room  Alert, without acute distress.  Hand-off given to patient's nurse.   Access used: RIGHT TDC Access issues: NONE  Total UF removed: 1L Medication(s) given: FERRIC GLUCONATE    02/06/23 1607  Vitals  Temp 98.1 F (36.7 C)  Temp Source Oral  BP (!) 166/95  MAP (mmHg) 115  BP Location Left Arm  BP Method Automatic  Patient Position (if appropriate) Lying  Pulse Rate 68  Pulse Rate Source Monitor  ECG Heart Rate 68  Resp 14  Oxygen Therapy  SpO2 100 %  O2 Device Room Air  During Treatment Monitoring  HD Safety Checks Performed Yes  Intra-Hemodialysis Comments Tx completed;Tolerated well  Dialysis Fluid Bolus Normal Saline  Bolus Amount (mL) 300 mL      Celisa Schoenberg [facility]

## 2023-02-06 NOTE — Consult Note (Addendum)
Chief Complaint: Patient was seen in consultation today for  Chief Complaint  Patient presents with   Chest Pain   Back Pain    Referring Physician(s): Dr. Jonnie Finner   Supervising Physician: Dr. Laurence Ferrari  Patient Status: [facility] - In-pt  History of Present Illness: Tricia Huynh is a 67 y.o. female with a medical history significant for DM2, heart failure and chronic kidney disease. She is followed by Nephrology for her CKD and she has been previously hospitalized for renal failure. She presented to the ED 02/02/23 with chest pain that had been ongoing for about one week. She also complained of weight gain and shortness of breath when lying flat. Work up in the ED was positive for hypertension (systolic in the [id number]) and labs consistent with AKI. Her chest pain was attributed to GERD.   Subsequent inpatient work up and medical management has been ineffective in improving her kidney function. The nephrology team is preparing her for hemodialysis.   Interventional Radiology has been asked to evaluate this patient for placement of an image-guided tunneled dialysis catheter to facilitate her hemodialysis requirements.   Past Medical History:  Diagnosis Date   Anemia of chronic kidney failure, stage 4 (severe) (HCC)    Diabetes mellitus type 2 in nonobese ([place]) 06/06/2015   Diabetes mellitus without complication ([place])    Diabetic neuropathy ([place]) 06/06/2015   Dyslipidemia 06/06/2015   Encephalopathy    Essential hypertension 06/06/2015   Hypertension    Mood disorder ([place]) 06/06/2015   Pneumonia 03/11/2017   Seizure ([place])    sees [name]. abd eeg, on keppra    Past Surgical History:  Procedure Laterality Date   ABDOMINAL HYSTERECTOMY     EYE SURGERY Bilateral    TUBAL LIGATION      Allergies: Percocet [oxycodone-acetaminophen] and Vicodin [hydrocodone-acetaminophen]  Medications: Prior to Admission medications   Medication Sig Start Date End Date Taking? Authorizing  Provider  amLODipine (NORVASC) 10 MG tablet Take 10 mg by mouth daily.   Yes [provider]  atorvastatin (LIPITOR) 40 MG tablet Take 40 mg by mouth daily. 12/23/19  Yes [provider]  carvedilol (COREG) 25 MG tablet Take 25 mg by mouth 2 (two) times daily with a meal.   Yes [provider]  gabapentin (NEURONTIN) 100 MG capsule Take 1 capsule (100 mg total) by mouth at bedtime. 07/28/22  Yes Danford, Suann Larry, MD  hydrALAZINE (APRESOLINE) 50 MG tablet Take 50 mg by mouth 3 (three) times daily.   Yes [provider]  insulin glargine (LANTUS SOLOSTAR) 100 UNIT/ML Solostar Pen Inject 5 Units into the skin daily. Discard pen after 28 days from first use Patient taking differently: Inject 10 Units into the skin daily. 07/28/22  Yes Danford, Suann Larry, MD  levETIRAcetam (KEPPRA) 500 MG tablet Take 1 tablet (500 mg total) by mouth 2 (two) times daily. 09/21/22 02/02/23 Yes Antonieta Pert, MD  sodium bicarbonate 650 MG tablet Take 650 mg by mouth 2 (two) times daily.   Yes [provider]  Vitamin D, Ergocalciferol, (DRISDOL) 1.25 MG (50000 UNIT) CAPS capsule Take 50,000 Units by mouth every 7 (seven) days.   Yes [provider]  albuterol (VENTOLIN HFA) 108 (90 Base) MCG/ACT inhaler Inhale 1-2 puffs into the lungs every 6 (six) hours as needed for wheezing or shortness of breath. Patient not taking: Reported on 02/02/2023 10/26/22   Varney Biles, MD  amLODipine (NORVASC) 10 MG tablet Take 1 tablet (10 mg total) by mouth daily.  09/21/22 10/21/22  Antonieta Pert, MD  carvedilol (COREG) 25 MG tablet Take 1 tablet (25 mg total) by mouth 2 (two) times daily with a meal. 09/21/22 10/21/22  Antonieta Pert, MD  Continuous Blood Gluc Sensor (FREESTYLE LIBRE 3 SENSOR) MISC 1 each by Does not apply route 2 (two) times a week. Place 1 sensor on the skin every 14 days. Use to check glucose continuously 09/22/22   Antonieta Pert, MD  ferrous sulfate 325 (65 FE) MG tablet Take 1  tablet (325 mg total) by mouth daily with breakfast. Patient not taking: Reported on 02/02/2023 05/02/22   Raiford Noble Latif, DO  hydrALAZINE (APRESOLINE) 50 MG tablet Take 1 tablet (50 mg total) by mouth every 8 (eight) hours. 09/21/22 10/21/22  Antonieta Pert, MD  Insulin Pen Needle (TECHLITE PEN NEEDLES) 32G X 4 MM MISC Use to inject lantus once a day 07/28/22   Danford, Suann Larry, MD  Multiple Vitamin (MULTIVITAMIN WITH MINERALS) TABS tablet Take 1 tablet by mouth daily. Patient not taking: Reported on 02/02/2023 05/02/22   Kerney Elbe, DO     Family History  Problem Relation Age of Onset   Heart disease Father    Kidney disease Father    Diabetes Mother    Seizures Sister    Seizures Brother     Social History   Socioeconomic History   Marital status: Single    Spouse name: Not on file   Number of children: 3   Years of education: 10   Highest education level: Not on file  Occupational History   Occupation: Disabled  Tobacco Use   Smoking status: Former    Types: Cigarettes   Smokeless tobacco: Never  Vaping Use   Vaping Use: Never used  Substance and Sexual Activity   Alcohol use: No   Drug use: No   Sexual activity: Yes  Other Topics Concern   Not on file  Social History Narrative   Lives at home with her grandchildren.   Right-handed.   No caffeine use.   Social Determinants of Health   Financial Resource Strain: Not on file  Food Insecurity: No Food Insecurity (02/02/2023)   Hunger Vital Sign    Worried About Running Out of Food in the Last Year: Never true    Ran Out of Food in the Last Year: Never true  Transportation Needs: No Transportation Needs (02/02/2023)   PRAPARE - Hydrologist (Medical): No    Lack of Transportation (Non-Medical): No  Physical Activity: Not on file  Stress: Not on file  Social Connections: Not on file    Review of Systems: A 12 point ROS discussed and pertinent positives are indicated in the  HPI above.  All other systems are negative.  Review of Systems  Constitutional:  Positive for appetite change and fatigue.  Respiratory:  Negative for cough and shortness of breath.   Cardiovascular:  Negative for chest pain and leg swelling.  Gastrointestinal:  Negative for abdominal pain, diarrhea, nausea and vomiting.  Genitourinary:  Negative for flank pain.  Musculoskeletal:  Negative for back pain.  Neurological:  Negative for dizziness and headaches.    Vital Signs: BP (!) 154/80 (BP Location: Left Arm)   Pulse 78   Temp 98.1 F (36.7 C) (Oral)   Resp 17   Ht 5\' 8"  (1.727 m)   Wt 128 lb 12 oz (58.4 kg)   SpO2 93%   BMI 19.58 kg/m   Physical Exam Constitutional:  General: She is not in acute distress.    Appearance: She is not ill-appearing.  HENT:     Mouth/Throat:     Mouth: Mucous membranes are moist.     Pharynx: Oropharynx is clear.  Cardiovascular:     Rate and Rhythm: Normal rate and regular rhythm.     Pulses: Normal pulses.     Heart sounds: Normal heart sounds.  Pulmonary:     Effort: Pulmonary effort is normal.     Breath sounds: Normal breath sounds.  Abdominal:     General: Bowel sounds are normal.     Palpations: Abdomen is soft.     Tenderness: There is no abdominal tenderness.  Musculoskeletal:     Right lower leg: No edema.     Left lower leg: No edema.  Skin:    General: Skin is warm and dry.  Neurological:     Mental Status: She is alert and oriented to person, place, and time.  Psychiatric:        Mood and Affect: Mood normal.        Behavior: Behavior normal.        Thought Content: Thought content normal.        Judgment: Judgment normal.     Imaging: US RENAL  Result Date: 02/03/2023 CLINICAL DATA:  Acute renal failure superimposed on stage IV chronic renal disease. EXAM: RENAL / URINARY TRACT ULTRASOUND COMPLETE COMPARISON:  None Available. FINDINGS: Right Kidney: Renal measurements: 9.2 cm x 3.5 cm x 5.7 cm = volume: 95.0  mL. Diffusely increased echogenicity of the renal parenchyma is noted. A 0.9 cm x 0.8 cm x 0.8 cm simple cyst is seen within the upper pole of the right kidney. A 1.4 cm x 1.4 cm x 1.4 cm simple cyst is also seen within the lower pole of the right kidney. No hydronephrosis is visualized. Left Kidney: Renal measurements: 9.3 cm x 5.5 cm x 4.6 cm = volume: 125.1 mL. Diffusely increased echogenicity of the renal parenchyma is noted. A 1.1 cm x 0.9 cm x 0.9 cm simple cyst is seen in the upper pole of the left kidney. A 1.5 cm x 1.4 cm x 1.4 cm simple cyst is noted within the lower pole of the left kidney. No hydronephrosis is visualized. Bladder: Appears normal for degree of bladder distention. Other: None. IMPRESSION: 1. Echogenic renal parenchyma, likely secondary to medical renal disease. 2. Bilateral simple renal cysts. Electronically Signed   By: Virgina Norfolk M.D.   On: 02/03/2023 18:46   DG Chest 2 View  Result Date: 02/02/2023 CLINICAL DATA:  Chest pain, shortness of breath. EXAM: CHEST - 2 VIEW COMPARISON:  10/26/2022. FINDINGS: Clear lungs. Stable mild cardiomegaly and mediastinal contours. No pleural effusion or pneumothorax. Visualized bones and upper abdomen are unremarkable. IMPRESSION: No evidence of acute cardiopulmonary disease. Electronically Signed   By: Emmit Alexanders M.D.   On: 02/02/2023 14:01    Labs:  CBC: Recent Labs    09/21/22 0335 10/26/22 1308 02/02/23 1351 02/04/23 0350 02/04/23 1801  WBC 5.3 6.5 6.1 4.9  --   HGB 7.5* 9.0* 7.3* 6.4* 8.2*  HCT 23.4* 28.6* 23.1* 20.9* 25.3*  PLT 221 234 204 183  --     COAGS: Recent Labs    07/25/22 0325  INR 1.2    BMP: Recent Labs    02/03/23 0415 02/04/23 0350 02/05/23 0419 02/06/23 0604  NA 136 139 138 140  K 3.5 3.9 4.1 3.9  CL 116*  116* 116* 117*  CO2 16* 17* 17* 15*  GLUCOSE 126* 129* 140* 143*  BUN 37* 36* 39* 37*  CALCIUM 8.0* 8.2* 8.3* 8.6*  CREATININE 4.11* 4.13* 4.43* 4.13*  GFRNONAA 11* 11* 10*  11*    LIVER FUNCTION TESTS: Recent Labs    07/25/22 0325 07/28/22 0516 09/18/22 1030 09/19/22 0250 09/20/22 0428 02/03/23 0415  BILITOT 0.4  --  0.4 0.5  --  0.5  AST 10*  --  21 15  --  16  ALT 8  --  16 12  --  12  ALKPHOS 104  --  126 100  --  88  PROT 5.9*  --  7.5 6.2*  --  6.2*  ALBUMIN 2.3*   < > 3.0* 2.4* 2.3* 2.7*   < > = values in this interval not displayed.    TUMOR MARKERS: No results for input(s): "AFPTM", "CEA", "CA199", "CHROMGRNA" in the last 8760 hours.  Assessment and Plan:  AKI on CKD stage IV; hemodialysis pending: Tricia Huynh, 67 year old female, presents today to the [facility] department for an image-guided tunneled dialysis catheter.   Risks and benefits discussed with the patient including, but not limited to bleeding, infection, vascular injury, pneumothorax which may require chest tube placement, air embolism or even death  All of the patient's questions were answered, patient is agreeable to proceed. She has been NPO.   Consent signed and in chart.  Thank you for this interesting consult.  I greatly enjoyed meeting Tricia Huynh and look forward to participating in their care.  A copy of this report was sent to the requesting provider on this date.  Electronically Signed: Soyla Dryer, AGACNP-BC [phone] 02/06/2023, 8:18 AM   I spent a total of 20 Minutes    in face to face in clinical consultation, greater than 50% of which was counseling/coordinating care for tunneled dialysis catheter

## 2023-02-06 NOTE — Progress Notes (Signed)
Arrived back to 5W unit from Hemodialysis unit.

## 2023-02-06 NOTE — Progress Notes (Signed)
Arrived back from IR via bed. Vitals obtained.

## 2023-02-06 NOTE — Progress Notes (Signed)
TRH night cross cover note:   I was notified by RN of the patient's complaint of some discomfort around her tunneled hemodialysis catheter, which was placed earlier today.  This discomfort has been refractory to existing order for prn acetaminophen.  I subsequently placed order for prn IV Dilaudid to try to improve her associated pain control.     Babs Bertin, DO Hospitalist

## 2023-02-06 NOTE — Plan of Care (Signed)
Went for Tunneled HD catheter placement with IR. Went to first HD treatment. Tolerated well. Continues to make urine. Ambulatory to bathroom with staff assist.   Problem: Education: Goal: Knowledge of General Education information will improve Description: Including pain rating scale, medication(s)/side effects and non-pharmacologic comfort measures Outcome: Progressing   Problem: Health Behavior/Discharge Planning: Goal: Ability to manage health-related needs will improve Outcome: Progressing   Problem: Clinical Measurements: Goal: Ability to maintain clinical measurements within normal limits will improve Outcome: Progressing Goal: Will remain free from infection Outcome: Progressing Goal: Diagnostic test results will improve Outcome: Progressing Goal: Respiratory complications will improve Outcome: Progressing Goal: Cardiovascular complication will be avoided Outcome: Progressing

## 2023-02-06 NOTE — Progress Notes (Signed)
Transported off unit with Patient Transporter to Hemodialysis unit via bed.

## 2023-02-06 NOTE — Progress Notes (Signed)
IR called to send for patient for dialysis catheter placement.  Patient transported off unit with patient transporter via bed to IR department.

## 2023-02-06 NOTE — Progress Notes (Signed)
   02/06/23 1430  Vitals  BP (!) 160/93  MAP (mmHg) 113  BP Location Left Arm  BP Method Automatic  Patient Position (if appropriate) Lying  Pulse Rate 69  Pulse Rate Source Monitor  ECG Heart Rate 70  Resp 15  Oxygen Therapy  SpO2 97 %  O2 Device Room Air  During Treatment Monitoring  Intra-Hemodialysis Comments  (tx paused due to cartridge clotting, new system hung, flush interval increased, blood rinsed back)  Dialysis Fluid Bolus Normal Saline  Bolus Amount (mL) 300 mL

## 2023-02-06 NOTE — Progress Notes (Signed)
   02/06/23 1440  Vitals  BP (!) 156/96  MAP (mmHg) 114  BP Location Left Arm  BP Method Automatic  Patient Position (if appropriate) Lying  Pulse Rate 66  Pulse Rate Source Monitor  ECG Heart Rate 66  Resp 16  Oxygen Therapy  SpO2 97 %  O2 Device Room Air  During Treatment Monitoring  Blood Flow Rate (mL/min) 250 mL/min  Arterial Pressure (mmHg) -50 mmHg  Venous Pressure (mmHg) 120 mmHg  TMP (mmHg) 15 mmHg  Ultrafiltration Rate (mL/min) 971 mL/min  Dialysate Flow Rate (mL/min) 300 ml/min  HD Safety Checks Performed Yes  Intra-Hemodialysis Comments  (tx resumed)  Dialysis Fluid Bolus Normal Saline  Bolus Amount (mL) 300 mL

## 2023-02-06 NOTE — Procedures (Signed)
Interventional Radiology Procedure Note  Procedure: Right IJ 23 cm Palindrome tunneled [place] with cath tips in the RA.  Complications: None  Estimated Blood Loss: None  Recommendations: - Routine line care   Signed,  Criselda Peaches, MD

## 2023-02-06 NOTE — Progress Notes (Signed)
[facility] Progress Note  Subjective: went for new TDC this am. Due for HD later today. No c/o's.   Vitals:   02/06/23 1200 02/06/23 1246 02/06/23 1259 02/06/23 1330  BP: (!) 169/95 (!) 167/91 (!) 163/93 (!) 160/58  Pulse: 72 70 67 72  Resp: 15 15 15 14   Temp: 97.8 F (36.6 C) 98.2 F (36.8 C)    TempSrc:  Oral    SpO2: 98% 99% 99% 100%  Weight:  58.1 kg    Height:        Exam: Gen alert, no distress, thin-framed AAF No jvd or bruits Chest clear bilat to bases RRR no MRG Abd soft ntnd no mass or ascites +bs Ext no LE edema Neuro is alert, Ox 3 , nf, no asterixis    Home meds include - norvasc 10, coreg 25 bid, hydralazine 50 tid, sod bicarb 650 tid, vit D, lipitor, lantus insulin , keppra , gabapentin 100 hs, prns/ vits/ supps     Date                           Creat               eGFR   2019                          0.80- 1.30   2020                          1.10- 1.65   2021                          1.84 --> 1.08    AKI    2022, jan                   1.28 --> 1.08    AKI   mar-jun 2022             0.96- 1.27   july- sept 2022          1.06- 1.45   april-jun 2023            1.37- 1.97        28- 43 ml/min, ckd 3b                sept- dec 2023          2.19- 2.94        17- 24 ml/min, ckd 4      3/18                           4.44                    02/03/23                      4.11                 11 ml/min              BP's today 127- 142/ 77- 82    UA- prot > 300, 1.014, rbc 0-5, wbc 21-50, bact none     UNa 85, UCr 81    Renal US - 9.2/ 9.3 cm kidneys w/o hydronephrosis      CXR 3/18 - no acute  disease   Assessment/ Plan: AKI on CKD 4 - b/l creat 2.2- 2.9 from late 2023, eGFR 17- 24 ml/min. Creat here was 4.4 on admission in the setting of GERD / chest pains.  BP's were high. She rec'd 2 L bolus w/o much response. Was not on acei/ ARB. Pt looked euvolemic on initial exam. UA showed ^protein + wbc's and renal US showed no obstruction. Urine lytes  c/w ATN and/or advanced CKD. We cont'd IVF's at 100 cc/hr but creat did not improve and pt c/o N/V, fatigue and no appetite c/w uremia. eGFR was < 12 ml/min. She likely has ESRD now and plan is initiate dialysis w/ 1st HD today and 2nd HD tomorrow. [facility] placed this am 3/22 by IR. Will consult [facility] as well for perm access.  HTN - cont coreg/ norvasc Anemia esrd - Hb low. Tsat 18% which is low, will start IV iron and darbe 40 mcg sq weekly for now.  Chest pain - prob GERD per pmd.  IDDM - per pmd     Kelly Splinter MD [facility] 02/06/2023, 1:50 PM  Recent Labs  Lab 02/03/23 0415 02/04/23 0350 02/04/23 1801 02/05/23 0419 02/06/23 0604  HGB  --  6.4* 8.2*  --   --   ALBUMIN 2.7*  --   --   --   --   CALCIUM 8.0* 8.2*  --  8.3* 8.6*  CREATININE 4.11* 4.13*  --  4.43* 4.13*  K 3.5 3.9  --  4.1 3.9    Recent Labs  Lab 02/04/23 1801  IRON 44  TIBC 251  FERRITIN 89    Inpatient medications:  amLODipine  10 mg Oral Daily   atorvastatin  40 mg Oral Daily   carvedilol  25 mg Oral BID WC   Chlorhexidine Gluconate Cloth  6 each Topical Q0600   fentaNYL       heparin sodium (porcine)       insulin aspart  0-24 Units Subcutaneous TID WC   levETIRAcetam  500 mg Oral BID   midazolam       pantoprazole  40 mg Oral Daily    ferric gluconate (FERRLECIT) IVPB 125 mg (02/05/23 1707)    acetaminophen **OR** acetaminophen, fentaNYL, [START ON 02/07/2023] heparin, heparin sodium (porcine), hydrALAZINE, midazolam

## 2023-02-07 ENCOUNTER — Inpatient Hospital Stay (HOSPITAL_COMMUNITY): Payer: 59

## 2023-02-07 DIAGNOSIS — I10 Essential (primary) hypertension: Secondary | ICD-10-CM | POA: Diagnosis not present

## 2023-02-07 DIAGNOSIS — R569 Unspecified convulsions: Secondary | ICD-10-CM | POA: Diagnosis not present

## 2023-02-07 DIAGNOSIS — E0849 Diabetes mellitus due to underlying condition with other diabetic neurological complication: Secondary | ICD-10-CM | POA: Diagnosis not present

## 2023-02-07 DIAGNOSIS — N186 End stage renal disease: Secondary | ICD-10-CM

## 2023-02-07 DIAGNOSIS — N179 Acute kidney failure, unspecified: Secondary | ICD-10-CM | POA: Diagnosis not present

## 2023-02-07 LAB — BASIC METABOLIC PANEL
Anion gap: 9 (ref 5–15)
BUN: 19 mg/dL (ref 8–23)
CO2: 21 mmol/L — ABNORMAL LOW (ref 22–32)
Calcium: 8.2 mg/dL — ABNORMAL LOW (ref 8.9–10.3)
Chloride: 105 mmol/L (ref 98–111)
Creatinine, Ser: 2.95 mg/dL — ABNORMAL HIGH (ref 0.44–1.00)
GFR, Estimated: 17 mL/min — ABNORMAL LOW (ref >60)
Glucose, Bld: 178 mg/dL — ABNORMAL HIGH (ref 70–99)
Potassium: 3.6 mmol/L (ref 3.5–5.1)
Sodium: 135 mmol/L (ref 135–145)

## 2023-02-07 LAB — GLUCOSE, CAPILLARY
Glucose-Capillary: 174 mg/dL — ABNORMAL HIGH (ref 70–99)
Glucose-Capillary: 106 mg/dL — ABNORMAL HIGH (ref 70–99)
Glucose-Capillary: 152 mg/dL — ABNORMAL HIGH (ref 70–99)
Glucose-Capillary: 235 mg/dL — ABNORMAL HIGH (ref 70–99)

## 2023-02-07 LAB — HEPATITIS B SURFACE ANTIBODY, QUANTITATIVE: Hep B S AB Quant (Post): <3.1 m[IU]/mL — ABNORMAL LOW (ref >9.9)

## 2023-02-07 MED ORDER — HEPARIN SODIUM (PORCINE) 1000 UNIT/ML IJ SOLN: 2000.0000 [IU] | Freq: Once | INTRAMUSCULAR | Status: DC

## 2023-02-07 MED ORDER — DARBEPOETIN ALFA 40 MCG/0.4ML IJ SOSY
40.0000 ug | PREFILLED_SYRINGE | INTRAMUSCULAR | Status: DC
  Administered 2023-02-07: 40 ug via SUBCUTANEOUS
  Filled 2023-02-07: qty 0.4

## 2023-02-07 MED ORDER — SODIUM CHLORIDE 0.9 % IV SOLN
250.0000 mg | Freq: Once | INTRAVENOUS | Status: AC
  Administered 2023-02-08: 250 mg via INTRAVENOUS
  Filled 2023-02-07: qty 20

## 2023-02-07 MED ORDER — HEPARIN SODIUM (PORCINE) 1000 UNIT/ML IJ SOLN
INTRAMUSCULAR | Status: AC
  Administered 2023-02-07: 2000 [IU]
  Filled 2023-02-07: qty 4

## 2023-02-07 MED ORDER — SODIUM CHLORIDE 0.9 % IV SOLN: 250.0000 mg | INTRAVENOUS | Status: DC

## 2023-02-07 MED ORDER — HYDRALAZINE HCL 20 MG/ML IJ SOLN: 10.0000 mg | INTRAMUSCULAR | Status: DC | PRN

## 2023-02-07 MED ORDER — HEPARIN SODIUM (PORCINE) 1000 UNIT/ML DIALYSIS
2000.0000 [IU] | INTRAMUSCULAR | Status: DC | PRN
  Administered 2023-02-07: 2000 [IU] via INTRAVENOUS_CENTRAL
  Filled 2023-02-07: qty 2

## 2023-02-07 MED ORDER — HEPARIN SODIUM (PORCINE) 1000 UNIT/ML IJ SOLN
INTRAMUSCULAR | Status: AC
  Administered 2023-02-07: 3800 [IU]
  Filled 2023-02-07: qty 2

## 2023-02-07 NOTE — Progress Notes (Signed)
Delay of dialysis treatment      Attempted to speak with the primary RN to get report on this pt for her upcoming treatment----called twice as of the present      Unable to put pt in transportation or initiate HD without report      HD charge RN notified

## 2023-02-07 NOTE — Progress Notes (Signed)
VASCULAR LAB    Vein mapping has been performed.  See CV proc for preliminary results.   Shenouda Genova, RVT 02/07/2023, 2:14 PM

## 2023-02-07 NOTE — Progress Notes (Signed)
   02/07/23 1130  Vitals  Temp 98.4 F (36.9 C)  Pulse Rate 78  Resp 12  BP (!) 187/100  SpO2 99 %  O2 Device Room Air  Weight 52 kg  Type of Weight Post-Dialysis  Oxygen Therapy  Pulse Oximetry Type Continuous  Oximetry Probe Site Changed No  Patient Activity (if Appropriate) In bed  Post Treatment  Dialyzer Clearance Lightly streaked  Duration of HD Treatment -hour(s) 3 hour(s)  Hemodialysis Intake (mL) 0 mL  Liters Processed 54.3  Fluid Removed (mL) 1000 mL  Tolerated HD Treatment Yes   Received patient in bed to unit.  Alert and oriented.  Informed consent signed and in chart.   TX duration:3.0 2nd HD tx Patient tolerated well.  Transported back to the room  Alert, without acute distress.  Hand-off given to patient's nurse.   Access used: [email or web address]-permacath Access issues: no complications  Total UF removed: 1000 Medication(s) given: none   [facility]

## 2023-02-07 NOTE — Progress Notes (Addendum)
[facility] Progress Note  Subjective: did okay w/ HD yesterday. Getting ready for HD today again. No c/o's.    Vitals:   02/07/23 0900 02/07/23 0930 02/07/23 1000 02/07/23 1030  BP: (!) 170/95 (!) 180/101 (!) 165/99   Pulse: 71 75 74 72  Resp: 10 10 18 11   Temp:      TempSrc:      SpO2: 97% 99% 98% 97%  Weight:      Height:        Exam: Gen alert, no distress, thin-framed AAF No jvd or bruits Chest clear bilat to bases RRR no MRG Abd soft ntnd no mass or ascites +bs Ext no LE edema Neuro is alert, Ox 3 , nf, no asterixis    Home meds include - norvasc 10, coreg 25 bid, hydralazine 50 tid, sod bicarb 650 tid, vit D, lipitor, lantus insulin , keppra , gabapentin 100 hs, prns/ vits/ supps     Date                           Creat               eGFR   2019                          0.80- 1.30   2020                          1.10- 1.65   2021                          1.84 --> 1.08    AKI    2022, jan                   1.28 --> 1.08    AKI   mar-jun 2022             0.96- 1.27   july- sept 2022          1.06- 1.45   april-jun 2023            1.37- 1.97        28- 43 ml/min, ckd 3b                sept- dec 2023          2.19- 2.94        17- 24 ml/min, ckd 4      3/18                           4.44                    02/03/23                      4.11                 11 ml/min              BP's today 127- 142/ 77- 82    UA- prot > 300, 1.014, rbc 0-5, wbc 21-50, bact none     UNa 85, UCr 81    Renal US - 9.2/ 9.3 cm kidneys w/o hydronephrosis      CXR 3/18 - no acute disease   Assessment/ Plan: AKI on  CKD 4 - b/l creat 2.2- 2.9 from late 2023, eGFR 17- 24 ml/min. Creat here was 4.4 on admission in the setting of GERD / chest pains.  BP's were high. She rec'd 2 L bolus w/o much response. Was not on acei/ ARB. Pt looked euvolemic on initial exam. UA showed ^protein + wbc's and renal US showed no obstruction. Urine lytes c/w ATN and/or advanced CKD. We cont'd IVF's  at 100 cc/hr but creat did not improve and pt c/o N/V, fatigue and no appetite c/w uremia. eGFR was < 12 ml/min. Suspect progression to ESRD. Had discussion w/ pmd Dr Carolin Sicks and plan was to start on HD. Pt had TDC placed by IR 3/22 and had 1st HD yesterday. 2nd HD is planned for today. [facility] consulting as well for perm access. To start CLIP on Monday, renal SW is aware.  HTN - cont coreg/ norvasc Anemia esrd - Hb low. Tsat 18%, pt got 125mg  IV Fe yest and will give additional 250mg  IV tomorrow x 1. Also starting esa rx w/ darbe 40 mcg / wk.  Chest pain - prob GERD per pmd.  IDDM - per pmd     Kelly Splinter MD [facility] 02/07/2023, 11:37 AM  Recent Labs  Lab 02/03/23 0415 02/04/23 0350 02/04/23 1801 02/05/23 0419 02/06/23 0604 02/07/23 0307  HGB  --  6.4* 8.2*  --   --   --   ALBUMIN 2.7*  --   --   --   --   --   CALCIUM 8.0* 8.2*  --    < > 8.6* 8.2*  CREATININE 4.11* 4.13*  --    < > 4.13* 2.95*  K 3.5 3.9  --    < > 3.9 3.6   < > = values in this interval not displayed.    Recent Labs  Lab 02/04/23 1801  IRON 44  TIBC 251  FERRITIN 89    Inpatient medications:  amLODipine  10 mg Oral Daily   atorvastatin  40 mg Oral Daily   carvedilol  25 mg Oral BID WC   Chlorhexidine Gluconate Cloth  6 each Topical Q0600   Chlorhexidine Gluconate Cloth  6 each Topical Q0600   heparin sodium (porcine)  2,000 Units Intravenous Once   insulin aspart  0-24 Units Subcutaneous TID WC   levETIRAcetam  500 mg Oral BID   pantoprazole  40 mg Oral Daily      acetaminophen **OR** acetaminophen, [START ON 02/08/2023] heparin, hydrALAZINE, HYDROmorphone (DILAUDID) injection, naLOXone (NARCAN)  injection, ondansetron (ZOFRAN) IV

## 2023-02-07 NOTE — Progress Notes (Signed)
PROGRESS NOTE    Tricia Huynh  [id number] DOB: 1955/11/23 DOA: 02/02/2023 PCP: Jonathon Jordan, MD    Brief Narrative:  Patient is a 66 years old female with past medical history of type 2 diabetes, CKD stage IV presented to hospital with chest pain, nausea, vomiting.  Initial thought to be GERD but labs showed creatinine of 4.4 from baseline 2.8.  Acute elevation in creatinine was thought to be secondary intravascular depletion from nausea vomiting and decreased oral intake.  Patient was then admitted hospital for further evaluation and treatment.   Assessment and Plan:  Acute on chronic kidney disease CKD 4-5 Thought to be secondary to intravascular volume depletion.  Baseline creatinine of 2.2-2.9 from 2023.  Creatinine today at 4.1.  Urine lites were consistent with ATN and not CKD.  Received IV fluids.  Patient did have good urinary output but no improvement in creatinine.  Patient continued to have nausea with low appetite and energy.  Since patient had uremic symptoms, nephrology consulted and plan was to proceed with hemodialysis.  Patient underwent  hemodialysis catheter placement and initiation of hemodialysis on 02/07/2023.  Further plans as per nephrology...   Chest pain/nausea/vomiting likely secondary to GERD On PPI.  No active chest pain.  Feels little better.  She did have some chest discomfort yesterday at the site of hemodialysis catheter but feels better today.   Diabetes mellitus type 2.  Continue sliding scale insulin.  Latest hemoglobin A1c of 7.0 from 02/05/2023. Continue sliding scale.  Long acting insulin on hold at this time.  Anemia likely secondary to chronic kidney disease. -hemoglobin 8.2 from 6.4 after 1 unit.  Check CBC in AM.  IV iron/EPO as per nephrology.  Anemia panel showed iron of 44 with TIBC of 251.  Check hemoglobin in AM.    HTN Of Coreg and amlodipine.  Blood pressure is elevated.  Will continue to monitor closely.  Add hydralazine as  needed.   Seizure disorder Continue Keppra    DVT prophylaxis: Place TED hose Start: 02/02/23 1720   Code Status:     Code Status: Full Code  Disposition: Home likely more than 3 days.  Status is: Inpatient  Remains inpatient appropriate because: Uremia, need for hemodialysis,   Family Communication: None at bedside.  Consultants:  Nephrology  Procedures:  Ultrasound-guided central catheter placement.   Ultrasound venous mapping Hemodialysis  Antimicrobials:  None  Anti-infectives (From admission, onward)    Start     Dose/Rate Route Frequency Ordered Stop   02/06/23 0930  ceFAZolin (ANCEF) IVPB 2g/100 mL premix        2 g 200 mL/hr over 30 Minutes Intravenous To Radiology 02/06/23 0835 02/06/23 0920       Subjective: Today, patient was seen and examined at bedside.  Had local chest wall discomfort at the site of catheter.  Denies any nausea vomiting today.  Seen during hemodialysis.  Objective: Vitals:   02/07/23 0900 02/07/23 0930 02/07/23 1000 02/07/23 1030  BP: (!) 170/95 (!) 180/101 (!) 165/99   Pulse: 71 75 74 72  Resp: 10 10 18 11   Temp:      TempSrc:      SpO2: 97% 99% 98% 97%  Weight:      Height:        Intake/Output Summary (Last 24 hours) at 02/07/2023 1052 Last data filed at 02/06/2023 1700 Gross per 24 hour  Intake 333.96 ml  Output 1700 ml  Net -1366.04 ml    [employer]  02/06/23 1246 02/06/23 1616 02/07/23 0814  Weight: 58.1 kg 56.6 kg 52.9 kg    Physical Examination: Body mass index is 17.73 kg/m.   General: Awake and Communicative, thinly built.   HENT:   No scleral pallor or icterus noted. Oral mucosa is moist.  Chest: Clear breath sounds bilaterally.  Right chest wall hemodialysis catheter in place. CVS: S1 &S2 heard. No murmur.  Regular rate and rhythm. Abdomen: Soft, nontender, nondistended.  Bowel sounds are heard.   Extremities: No cyanosis, clubbing or edema.  Peripheral pulses are palpable. Psych: Alert, awake  and oriented, normal mood CNS:  No cranial nerve deficits.  Generalized weakness noted Skin: Warm and dry.  No rashes noted.  Data Reviewed:   CBC: Recent Labs  Lab 02/02/23 1351 02/04/23 0350 02/04/23 1801  WBC 6.1 4.9  --   NEUTROABS 4.1  --   --   HGB 7.3* 6.4* 8.2*  HCT 23.1* 20.9* 25.3*  MCV 92.0 94.6  --   PLT 204 183  --      Basic Metabolic Panel: Recent Labs  Lab 02/03/23 0415 02/04/23 0350 02/05/23 0419 02/06/23 0604 02/07/23 0307  NA 136 139 138 140 135  K 3.5 3.9 4.1 3.9 3.6  CL 116* 116* 116* 117* 105  CO2 16* 17* 17* 15* 21*  GLUCOSE 126* 129* 140* 143* 178*  BUN 37* 36* 39* 37* 19  CREATININE 4.11* 4.13* 4.43* 4.13* 2.95*  CALCIUM 8.0* 8.2* 8.3* 8.6* 8.2*     Liver Function Tests: Recent Labs  Lab 02/03/23 0415  AST 16  ALT 12  ALKPHOS 88  BILITOT 0.5  PROT 6.2*  ALBUMIN 2.7*      Radiology Studies: IR Fluoro Guide CV Line Right  Result Date: 02/06/2023 INDICATION: 67 year old female in need of hemodialysis. She presents for placement of a tunneled hemodialysis catheter. EXAM: TUNNELED CENTRAL VENOUS HEMODIALYSIS CATHETER PLACEMENT WITH ULTRASOUND AND FLUOROSCOPIC GUIDANCE MEDICATIONS: 2 g Ancef. The antibiotic was given in an appropriate time interval prior to skin puncture. ANESTHESIA/SEDATION: Moderate (conscious) sedation was employed during this procedure. A total of Versed 1 mg and Fentanyl 50 mcg was administered intravenously. Moderate Sedation Time: 13 minutes. The patient's level of consciousness and vital signs were monitored continuously by radiology nursing throughout the procedure under my direct supervision. FLUOROSCOPY TIME:  Radiation exposure index: 1 mGy reference air kerma COMPLICATIONS: None immediate. PROCEDURE: Informed written consent was obtained from the patient after a discussion of the risks, benefits, and alternatives to treatment. Questions regarding the procedure were encouraged and answered. The right neck and  chest were prepped with chlorhexidine in a sterile fashion, and a sterile drape was applied covering the operative field. Maximum barrier sterile technique with sterile gowns and gloves were used for the procedure. A timeout was performed prior to the initiation of the procedure. After creating a small venotomy incision, a micropuncture kit was utilized to access the right internal jugular vein under direct, real-time ultrasound guidance after the overlying soft tissues were anesthetized with 1% lidocaine with epinephrine. Ultrasound image documentation was performed. The microwire was kinked to measure appropriate catheter length. A stiff Glidewire was advanced to the level of the IVC and the micropuncture sheath was exchanged for a peel-away sheath. A palindrome tunneled hemodialysis catheter measuring 23 cm from tip to cuff was tunneled in a retrograde fashion from the anterior chest wall to the venotomy incision. The catheter was then placed through the peel-away sheath with tips ultimately positioned within the superior aspect  of the right atrium. Final catheter positioning was confirmed and documented with a spot radiographic image. The catheter aspirates and flushes normally. The catheter was flushed with appropriate volume heparin dwells and then capped. The catheter exit site was secured with a 0-Prolene retention suture. The venotomy incision was closed with Dermabond. Dressings were applied. The patient tolerated the procedure well without immediate post procedural complication. IMPRESSION: Successful placement of 23 cm tip to cuff tunneled hemodialysis catheter via the right internal jugular vein with tips terminating within the superior aspect of the right atrium. The catheter is ready for immediate use. Electronically Signed   By: Jacqulynn Cadet M.D.   On: 02/06/2023 10:05   IR US Guide Vasc Access Right  Result Date: 02/06/2023 INDICATION: 67 year old female in need of hemodialysis. She  presents for placement of a tunneled hemodialysis catheter. EXAM: TUNNELED CENTRAL VENOUS HEMODIALYSIS CATHETER PLACEMENT WITH ULTRASOUND AND FLUOROSCOPIC GUIDANCE MEDICATIONS: 2 g Ancef. The antibiotic was given in an appropriate time interval prior to skin puncture. ANESTHESIA/SEDATION: Moderate (conscious) sedation was employed during this procedure. A total of Versed 1 mg and Fentanyl 50 mcg was administered intravenously. Moderate Sedation Time: 13 minutes. The patient's level of consciousness and vital signs were monitored continuously by radiology nursing throughout the procedure under my direct supervision. FLUOROSCOPY TIME:  Radiation exposure index: 1 mGy reference air kerma COMPLICATIONS: None immediate. PROCEDURE: Informed written consent was obtained from the patient after a discussion of the risks, benefits, and alternatives to treatment. Questions regarding the procedure were encouraged and answered. The right neck and chest were prepped with chlorhexidine in a sterile fashion, and a sterile drape was applied covering the operative field. Maximum barrier sterile technique with sterile gowns and gloves were used for the procedure. A timeout was performed prior to the initiation of the procedure. After creating a small venotomy incision, a micropuncture kit was utilized to access the right internal jugular vein under direct, real-time ultrasound guidance after the overlying soft tissues were anesthetized with 1% lidocaine with epinephrine. Ultrasound image documentation was performed. The microwire was kinked to measure appropriate catheter length. A stiff Glidewire was advanced to the level of the IVC and the micropuncture sheath was exchanged for a peel-away sheath. A palindrome tunneled hemodialysis catheter measuring 23 cm from tip to cuff was tunneled in a retrograde fashion from the anterior chest wall to the venotomy incision. The catheter was then placed through the peel-away sheath with tips  ultimately positioned within the superior aspect of the right atrium. Final catheter positioning was confirmed and documented with a spot radiographic image. The catheter aspirates and flushes normally. The catheter was flushed with appropriate volume heparin dwells and then capped. The catheter exit site was secured with a 0-Prolene retention suture. The venotomy incision was closed with Dermabond. Dressings were applied. The patient tolerated the procedure well without immediate post procedural complication. IMPRESSION: Successful placement of 23 cm tip to cuff tunneled hemodialysis catheter via the right internal jugular vein with tips terminating within the superior aspect of the right atrium. The catheter is ready for immediate use. Electronically Signed   By: Jacqulynn Cadet M.D.   On: 02/06/2023 10:05      LOS: 5 days    Flora Lipps, MD [facility] Available via Epic secure chat 7am-7pm After these hours, please refer to coverage provider listed on [email or web address] 02/07/2023, 10:52 AM

## 2023-02-08 DIAGNOSIS — R569 Unspecified convulsions: Secondary | ICD-10-CM | POA: Diagnosis not present

## 2023-02-08 DIAGNOSIS — E0849 Diabetes mellitus due to underlying condition with other diabetic neurological complication: Secondary | ICD-10-CM | POA: Diagnosis not present

## 2023-02-08 DIAGNOSIS — I10 Essential (primary) hypertension: Secondary | ICD-10-CM | POA: Diagnosis not present

## 2023-02-08 DIAGNOSIS — N179 Acute kidney failure, unspecified: Secondary | ICD-10-CM | POA: Diagnosis not present

## 2023-02-08 LAB — BASIC METABOLIC PANEL
Anion gap: 6 (ref 5–15)
BUN: 14 mg/dL (ref 8–23)
CO2: 28 mmol/L (ref 22–32)
Calcium: 7.7 mg/dL — ABNORMAL LOW (ref 8.9–10.3)
Chloride: 102 mmol/L (ref 98–111)
Creatinine, Ser: 3.01 mg/dL — ABNORMAL HIGH (ref 0.44–1.00)
GFR, Estimated: 17 mL/min — ABNORMAL LOW (ref >60)
Glucose, Bld: 159 mg/dL — ABNORMAL HIGH (ref 70–99)
Potassium: 3.4 mmol/L — ABNORMAL LOW (ref 3.5–5.1)
Sodium: 136 mmol/L (ref 135–145)

## 2023-02-08 LAB — CBC
HCT: 24.6 % — ABNORMAL LOW (ref 36.0–46.0)
Hemoglobin: 8 g/dL — ABNORMAL LOW (ref 12.0–15.0)
MCH: 29.2 pg (ref 26.0–34.0)
MCHC: 32.5 g/dL (ref 30.0–36.0)
MCV: 89.8 fL (ref 80.0–100.0)
Platelets: 177 10*3/uL (ref 150–400)
RBC: 2.74 MIL/uL — ABNORMAL LOW (ref 3.87–5.11)
RDW: 14.6 % (ref 11.5–15.5)
WBC: 5.7 10*3/uL (ref 4.0–10.5)
nRBC: 0 % (ref 0.0–0.2)

## 2023-02-08 LAB — GLUCOSE, CAPILLARY
Glucose-Capillary: 163 mg/dL — ABNORMAL HIGH (ref 70–99)
Glucose-Capillary: 150 mg/dL — ABNORMAL HIGH (ref 70–99)
Glucose-Capillary: 117 mg/dL — ABNORMAL HIGH (ref 70–99)
Glucose-Capillary: 129 mg/dL — ABNORMAL HIGH (ref 70–99)

## 2023-02-08 LAB — MAGNESIUM: Magnesium: 1.5 mg/dL — ABNORMAL LOW (ref 1.7–2.4)

## 2023-02-08 MED ORDER — POTASSIUM CHLORIDE CRYS ER 20 MEQ PO TBCR
20.0000 meq | EXTENDED_RELEASE_TABLET | Freq: Once | ORAL | Status: AC
  Administered 2023-02-08: 20 meq via ORAL
  Filled 2023-02-08: qty 1

## 2023-02-08 MED ORDER — HYDRALAZINE HCL 50 MG PO TABS
50.0000 mg | ORAL_TABLET | Freq: Three times a day (TID) | ORAL | Status: DC
  Administered 2023-02-08 – 2023-02-12 (×11): 50 mg via ORAL
  Filled 2023-02-08 (×11): qty 1

## 2023-02-08 MED ORDER — CHLORHEXIDINE GLUCONATE CLOTH 2 % EX PADS
6.0000 | MEDICATED_PAD | Freq: Every day | CUTANEOUS | Status: DC
  Administered 2023-02-09 – 2023-02-12 (×4): 6 via TOPICAL

## 2023-02-08 NOTE — Progress Notes (Signed)
R chest HD cath continues to bleed and patient is also reporting pain at the site.  Per primary RN the rate of bleeding has been the same today.  IV team has recommended notifying the MD/IR to assess; primary RN states she will inform the MD.

## 2023-02-08 NOTE — Progress Notes (Signed)
PROGRESS NOTE    Tricia Huynh  [id number] DOB: 02/28/1956 DOA: 02/02/2023 PCP: Jonathon Jordan, MD    Brief Narrative:  Patient is a 67 years old female with past medical history of type 2 diabetes, CKD stage IV presented to hospital with chest pain, nausea, vomiting.  Initial thought to be GERD but labs showed creatinine of 4.4 from baseline 2.8.  Acute elevation in creatinine was thought to be secondary intravascular depletion from nausea vomiting and decreased oral intake.  Patient was then admitted hospital for further evaluation and treatment.   Assessment and Plan:  Acute on chronic kidney disease CKD 4-5 Thought to be secondary to intravascular volume depletion.  Baseline creatinine of 2.2-2.9 from 2023.  Creatinine today at 3.0 from 4.1.  Urine lites were consistent with ATN and not CKD.  Received IV fluids.  Since patient had uremic symptoms, nephrology consulted and plan was to proceed with hemodialysis.  Patient underwent  hemodialysis catheter placement and initiation of hemodialysis on 02/07/2023.  Further plans as per nephrology.   Chest pain/nausea/vomiting likely secondary to GERD Resolved.  On PPI.    Mild hypokalemia.  Will replace orally.  Check BMP in AM.   Diabetes mellitus type 2.   Latest hemoglobin A1c of 7.0 from 02/05/2023. Continue sliding scale, long acting insulin on hold at this time.  Anemia likely secondary to chronic kidney disease. -hemoglobin 8.0 from 6.4 after 1 unit.  Check CBC in AM.  IV iron/EPO as per nephrology.  Anemia panel showed iron of 44 with TIBC of 251.  Check hemoglobin in AM.    HTN On Coreg and amlodipine.  Blood pressure is elevated.  Will continue to monitor closely.  Add hydralazine as needed.   Seizure disorder Continue Keppra    DVT prophylaxis: Place TED hose Start: 02/02/23 1720   Code Status:     Code Status: Full Code  Disposition: Home likely in 2 to 3 days.  Will likely need hemodialysis set up as  outpatient.  Status is: Inpatient  Remains inpatient appropriate because: Uremia, need for hemodialysis,   Family Communication: None at bedside.  Consultants:  Nephrology  Procedures:  Ultrasound-guided central catheter placement.   Ultrasound venous mapping Hemodialysis  Antimicrobials:  None  Anti-infectives (From admission, onward)    Start     Dose/Rate Route Frequency Ordered Stop   02/06/23 0930  ceFAZolin (ANCEF) IVPB 2g/100 mL premix        2 g 200 mL/hr over 30 Minutes Intravenous To Radiology 02/06/23 0835 02/06/23 0920       Subjective: Today, patient was seen and examined at bedside.  Complains of mild discomfort at the chest wall site.  Denies any nausea, vomiting, fever, chills or rigor.   Objective: Vitals:   02/07/23 2252 02/07/23 2305 02/08/23 0315 02/08/23 0700  BP:  (!) 161/93 (!) 159/88 (!) 175/92  Pulse:  72 72   Resp:  14 15   Temp: 98.5 F (36.9 C) 98.2 F (36.8 C) 98.6 F (37 C) 98.6 F (37 C)  TempSrc:  Oral Oral Oral  SpO2:  96% 98%   Weight:      Height:        Intake/Output Summary (Last 24 hours) at 02/08/2023 1158 Last data filed at 02/08/2023 0845 Gross per 24 hour  Intake 360 ml  Output 400 ml  Net -40 ml    [employer]   02/07/23 0814 02/07/23 1125 02/07/23 1130  Weight: 52.9 kg 52 kg 52 kg  Physical Examination: Body mass index is 17.43 kg/m.   General: Awake and Communicative, thinly built.   HENT:   No scleral pallor or icterus noted. Oral mucosa is moist.  Chest: Clear breath sounds bilaterally.  Right chest wall hemodialysis catheter in place. CVS: S1 &S2 heard. No murmur.  Regular rate and rhythm. Abdomen: Soft, nontender, nondistended.  Bowel sounds are heard.   Extremities: No cyanosis, clubbing or edema.  Peripheral pulses are palpable. Psych: Alert, awake and oriented, normal mood CNS:  No cranial nerve deficits.  Generalized weakness noted Skin: Warm and dry.  No rashes noted.  Data Reviewed:    CBC: Recent Labs  Lab 02/02/23 1351 02/04/23 0350 02/04/23 1801 02/08/23 0330  WBC 6.1 4.9  --  5.7  NEUTROABS 4.1  --   --   --   HGB 7.3* 6.4* 8.2* 8.0*  HCT 23.1* 20.9* 25.3* 24.6*  MCV 92.0 94.6  --  89.8  PLT 204 183  --  177     Basic Metabolic Panel: Recent Labs  Lab 02/04/23 0350 02/05/23 0419 02/06/23 0604 02/07/23 0307 02/08/23 0330  NA 139 138 140 135 136  K 3.9 4.1 3.9 3.6 3.4*  CL 116* 116* 117* 105 102  CO2 17* 17* 15* 21* 28  GLUCOSE 129* 140* 143* 178* 159*  BUN 36* 39* 37* 19 14  CREATININE 4.13* 4.43* 4.13* 2.95* 3.01*  CALCIUM 8.2* 8.3* 8.6* 8.2* 7.7*  MG  --   --   --   --  1.5*     Liver Function Tests: Recent Labs  Lab 02/03/23 0415  AST 16  ALT 12  ALKPHOS 88  BILITOT 0.5  PROT 6.2*  ALBUMIN 2.7*      Radiology Studies: VAS [place] UPPER EXT VEIN MAPPING (PRE-OP AVF)  Result Date: 02/07/2023 UPPER EXTREMITY VEIN MAPPING Patient Name:  Tricia Huynh  Date of Exam:   02/07/2023 Medical Rec #: [id number]               Accession #:    [id number] Date of Birth: Dec 07, 1955               Patient Gender: F Patient Age:   54 years Exam Location:  [facility] Procedure:      VAS [place] UPPER EXT VEIN MAPPING (PRE-OP AVF) Referring Phys: Dagoberto Ligas --------------------------------------------------------------------------------  Indications: Pre-access. History: ESRD.  Limitations: IV/bandages right arm. Body habitus (BMI 17.43) Comparison Study: No prior study Performing Technologist: Sharion Dove RVS  Examination Guidelines: A complete evaluation includes B-mode imaging, spectral Doppler, color Doppler, and power Doppler as needed of all accessible portions of each vessel. Bilateral testing is considered an integral part of a complete examination. Limited examinations for reoccurring indications may be performed as noted. +-----------------+-------------+----------+--------------+ Right Cephalic   Diameter (cm)Depth (cm)    Findings    +-----------------+-------------+----------+--------------+ Prox upper arm       0.30        0.21                  +-----------------+-------------+----------+--------------+ Mid upper arm        0.32        0.31                  +-----------------+-------------+----------+--------------+ Dist upper arm       0.46        0.37      thrombus    +-----------------+-------------+----------+--------------+ Antecubital fossa    0.61  0.22      thrombus    +-----------------+-------------+----------+--------------+ Prox forearm                            not visualized +-----------------+-------------+----------+--------------+ Mid forearm                             not visualized +-----------------+-------------+----------+--------------+ Dist forearm                            not visualized +-----------------+-------------+----------+--------------+ Wrist                                   not visualized +-----------------+-------------+----------+--------------+ +-----------------+-------------+----------+--------+ Right Basilic    Diameter (cm)Depth (cm)Findings +-----------------+-------------+----------+--------+ Prox upper arm       0.39        0.89            +-----------------+-------------+----------+--------+ Mid upper arm        0.42        0.89            +-----------------+-------------+----------+--------+ Dist upper arm       0.24        0.42            +-----------------+-------------+----------+--------+ Antecubital fossa    0.18        0.20            +-----------------+-------------+----------+--------+ Prox forearm         0.22        0.17            +-----------------+-------------+----------+--------+ Mid forearm          0.16        0.17            +-----------------+-------------+----------+--------+ Wrist                0.18        0.17             +-----------------+-------------+----------+--------+ +-----------------+-------------+----------+-----------------------+ Left Cephalic    Diameter (cm)Depth (cm)       Findings         +-----------------+-------------+----------+-----------------------+ Prox upper arm       0.36        0.49                           +-----------------+-------------+----------+-----------------------+ Mid upper arm        0.30        0.31                           +-----------------+-------------+----------+-----------------------+ Dist upper arm       0.33        0.20                           +-----------------+-------------+----------+-----------------------+ Antecubital fossa    0.46        0.24   branching just below AC +-----------------+-------------+----------+-----------------------+ Prox forearm         0.17        0.20                           +-----------------+-------------+----------+-----------------------+  Mid forearm          0.19        0.14                           +-----------------+-------------+----------+-----------------------+ Wrist                0.19        0.20                           +-----------------+-------------+----------+-----------------------+ +-----------------+-------------+----------+--------+ Left Basilic     Diameter (cm)Depth (cm)Findings +-----------------+-------------+----------+--------+ Prox upper arm       0.39        0.57            +-----------------+-------------+----------+--------+ Mid upper arm        0.38        0.54            +-----------------+-------------+----------+--------+ Antecubital fossa    0.30        0.20            +-----------------+-------------+----------+--------+ Prox forearm         0.34        0.16            +-----------------+-------------+----------+--------+ Mid forearm          0.31        0.16            +-----------------+-------------+----------+--------+ Wrist                 0.15        0.19            +-----------------+-------------+----------+--------+ *See table(s) above for measurements and observations.  Diagnosing physician:    Preliminary       LOS: 6 days    Flora Lipps, MD [facility] Available via Epic secure chat 7am-7pm After these hours, please refer to coverage provider listed on [email or web address] 02/08/2023, 11:58 AM

## 2023-02-08 NOTE — Progress Notes (Signed)
R chest HD cath bleeding. Manual pressure held to site x 15 minutes. Site cleaned with CHG, clot noted at insertion site with oozing. Secure port, antimicrobial disc and gauze pressure dressing applied. Plan to leave dressing in place 48 hours unless no longer intact/ sealed.

## 2023-02-08 NOTE — Progress Notes (Signed)
[facility] Progress Note  Subjective: had some N/V w/ the 1st HD Friday, but HD yesterday went much better and today she is feeling good eating breakfast.    Vitals:   02/07/23 2252 02/07/23 2305 02/08/23 0315 02/08/23 0700  BP:  (!) 161/93 (!) 159/88 (!) 175/92  Pulse:  72 72   Resp:  14 15   Temp: 98.5 F (36.9 C) 98.2 F (36.8 C) 98.6 F (37 C) 98.6 F (37 C)  TempSrc:  Oral Oral Oral  SpO2:  96% 98%   Weight:      Height:        Exam: Gen alert, no distress, thin-framed AAF No jvd or bruits Chest clear bilat to bases RRR no MRG Abd soft ntnd no mass or ascites +bs Ext no LE edema Neuro is alert, Ox 3 , nf, no asterixis    Home meds include - norvasc 10, coreg 25 bid, hydralazine 50 tid, sod bicarb 650 tid, vit D, lipitor, lantus insulin , keppra , gabapentin 100 hs, prns/ vits/ supps     Date                           Creat               eGFR   2019                          0.80- 1.30   2020                          1.10- 1.65   2021                          1.84 --> 1.08    AKI    2022, jan                   1.28 --> 1.08    AKI   mar-jun 2022             0.96- 1.27   july- sept 2022          1.06- 1.45   april-jun 2023            1.37- 1.97        28- 43 ml/min, ckd 3b                sept- dec 2023          2.19- 2.94        17- 24 ml/min, ckd 4      3/18                           4.44                    02/03/23                      4.11                 11 ml/min              BP's today 127- 142/ 77- 82    UA- prot > 300, 1.014, rbc 0-5, wbc 21-50, bact none     UNa 85, UCr 81    Renal US - 9.2/ 9.3  cm kidneys w/o hydronephrosis      CXR 3/18 - no acute disease   Assessment/ Plan: AKI on CKD 4 - b/l creat 2.2- 2.9 from late 2023, eGFR 17- 24 ml/min. Creat here was 4.4 on admission in the setting of GERD / chest pains.  BP's were high. She rec'd 2 L bolus w/o much response. Was not on acei/ ARB. Pt looked euvolemic on initial exam. UA showed  ^protein + wbc's and renal US showed no obstruction. Urine lytes c/w ATN and/or advanced CKD. We cont'd IVF's at 100 cc/hr but creat did not improve and pt c/o N/V, fatigue and poor appetite. GFR was < 12 ml/min. Suspect this is progression to ESRD. Discussed w/ renal MD Dr Carolin Sicks and plan was made to start dialysis. TDC was placed 3/22 by IR, and had 1st HD Friday and 2nd HD yesterday. Plan 3rd HD monday. [facility] is consulting as well for perm access. CLIP has not been started yet, renal SW is aware and will start CLIP tomorrow.  HTN - cont coreg/ norvasc Anemia esrd - Hb low. Tsat 18%, pt got 125mg  IV Fe Friday and will give additional 250mg  IV today / sunday. Also starting esa rx w/ darbe 40 mcg / wk.  Chest pain - prob GERD per pmd.  IDDM - per pmd     Kelly Splinter MD [facility] 02/08/2023, 11:51 AM  Recent Labs  Lab 02/03/23 0415 02/04/23 0350 02/04/23 1801 02/05/23 0419 02/07/23 0307 02/08/23 0330  HGB  --    < > 8.2*  --   --  8.0*  ALBUMIN 2.7*  --   --   --   --   --   CALCIUM 8.0*   < >  --    < > 8.2* 7.7*  CREATININE 4.11*   < >  --    < > 2.95* 3.01*  K 3.5   < >  --    < > 3.6 3.4*   < > = values in this interval not displayed.    Recent Labs  Lab 02/04/23 1801  IRON 44  TIBC 251  FERRITIN 89    Inpatient medications:  amLODipine  10 mg Oral Daily   atorvastatin  40 mg Oral Daily   carvedilol  25 mg Oral BID WC   Chlorhexidine Gluconate Cloth  6 each Topical Q0600   Chlorhexidine Gluconate Cloth  6 each Topical Q0600   darbepoetin (ARANESP) injection - DIALYSIS  40 mcg Subcutaneous Q Sat-1800   heparin sodium (porcine)  2,000 Units Intravenous Once   hydrALAZINE  50 mg Oral Q8H   insulin aspart  0-24 Units Subcutaneous TID WC   levETIRAcetam  500 mg Oral BID   pantoprazole  40 mg Oral Daily    ferric gluconate (FERRLECIT) IVPB 250 mg (02/08/23 0954)     acetaminophen **OR** acetaminophen, hydrALAZINE, HYDROmorphone (DILAUDID) injection, naLOXone (NARCAN)   injection, ondansetron (ZOFRAN) IV

## 2023-02-09 DIAGNOSIS — R569 Unspecified convulsions: Secondary | ICD-10-CM | POA: Diagnosis not present

## 2023-02-09 DIAGNOSIS — E0849 Diabetes mellitus due to underlying condition with other diabetic neurological complication: Secondary | ICD-10-CM | POA: Diagnosis not present

## 2023-02-09 DIAGNOSIS — N179 Acute kidney failure, unspecified: Secondary | ICD-10-CM | POA: Diagnosis not present

## 2023-02-09 DIAGNOSIS — I10 Essential (primary) hypertension: Secondary | ICD-10-CM | POA: Diagnosis not present

## 2023-02-09 LAB — GLUCOSE, CAPILLARY
Glucose-Capillary: 140 mg/dL — ABNORMAL HIGH (ref 70–99)
Glucose-Capillary: 191 mg/dL — ABNORMAL HIGH (ref 70–99)

## 2023-02-09 LAB — CBC
HCT: 24.8 % — ABNORMAL LOW (ref 36.0–46.0)
Hemoglobin: 8 g/dL — ABNORMAL LOW (ref 12.0–15.0)
MCH: 29.1 pg (ref 26.0–34.0)
MCHC: 32.3 g/dL (ref 30.0–36.0)
MCV: 90.2 fL (ref 80.0–100.0)
Platelets: 179 10*3/uL (ref 150–400)
RBC: 2.75 MIL/uL — ABNORMAL LOW (ref 3.87–5.11)
RDW: 14.7 % (ref 11.5–15.5)
WBC: 5.7 10*3/uL (ref 4.0–10.5)
nRBC: 0 % (ref 0.0–0.2)

## 2023-02-09 LAB — BASIC METABOLIC PANEL
Anion gap: 7 (ref 5–15)
BUN: 17 mg/dL (ref 8–23)
CO2: 25 mmol/L (ref 22–32)
Calcium: 8.3 mg/dL — ABNORMAL LOW (ref 8.9–10.3)
Chloride: 104 mmol/L (ref 98–111)
Creatinine, Ser: 3.68 mg/dL — ABNORMAL HIGH (ref 0.44–1.00)
GFR, Estimated: 13 mL/min — ABNORMAL LOW (ref >60)
Glucose, Bld: 137 mg/dL — ABNORMAL HIGH (ref 70–99)
Potassium: 3.5 mmol/L (ref 3.5–5.1)
Sodium: 136 mmol/L (ref 135–145)

## 2023-02-09 LAB — MAGNESIUM: Magnesium: 1.6 mg/dL — ABNORMAL LOW (ref 1.7–2.4)

## 2023-02-09 MED ORDER — ONDANSETRON HCL 4 MG/2ML IJ SOLN
INTRAMUSCULAR | Status: AC
  Filled 2023-02-09: qty 2

## 2023-02-09 MED ORDER — GELATIN ABSORBABLE 12-7 MM EX MISC
CUTANEOUS | Status: AC
  Filled 2023-02-09: qty 1

## 2023-02-09 MED ORDER — HEPARIN SODIUM (PORCINE) 1000 UNIT/ML IJ SOLN
INTRAMUSCULAR | Status: AC
  Administered 2023-02-09: 3800 [IU]
  Filled 2023-02-09: qty 4

## 2023-02-09 MED ORDER — SODIUM CHLORIDE 0.9 % IV SOLN
250.0000 mg | Freq: Every day | INTRAVENOUS | Status: AC
  Administered 2023-02-09 – 2023-02-10 (×2): 250 mg via INTRAVENOUS
  Filled 2023-02-09 (×2): qty 20

## 2023-02-09 MED ORDER — MAGNESIUM OXIDE -MG SUPPLEMENT 400 (240 MG) MG PO TABS
400.0000 mg | ORAL_TABLET | Freq: Once | ORAL | Status: AC
  Administered 2023-02-09: 400 mg via ORAL
  Filled 2023-02-09 (×2): qty 1

## 2023-02-09 NOTE — Plan of Care (Signed)

## 2023-02-09 NOTE — Procedures (Signed)
I was present at this dialysis session. I have reviewed the session itself and made appropriate changes.   Filed Weights   02/07/23 1125 02/07/23 1130 02/09/23 0849  Weight: 52 kg 52 kg 51.5 kg    Recent Labs  Lab 02/09/23 0659  NA 136  K 3.5  CL 104  CO2 25  GLUCOSE 137*  BUN 17  CREATININE 3.68*  CALCIUM 8.3*    Recent Labs  Lab 02/02/23 1351 02/04/23 0350 02/04/23 1801 02/08/23 0330 02/09/23 0659  WBC 6.1 4.9  --  5.7 5.7  NEUTROABS 4.1  --   --   --   --   HGB 7.3* 6.4* 8.2* 8.0* 8.0*  HCT 23.1* 20.9* 25.3* 24.6* 24.8*  MCV 92.0 94.6  --  89.8 90.2  PLT 204 183  --  177 179    Scheduled Meds:  amLODipine  10 mg Oral Daily   atorvastatin  40 mg Oral Daily   carvedilol  25 mg Oral BID WC   Chlorhexidine Gluconate Cloth  6 each Topical Q0600   darbepoetin (ARANESP) injection - DIALYSIS  40 mcg Subcutaneous Q Sat-1800   heparin sodium (porcine)  2,000 Units Intravenous Once   hydrALAZINE  50 mg Oral Q8H   insulin aspart  0-24 Units Subcutaneous TID WC   levETIRAcetam  500 mg Oral BID   ondansetron       pantoprazole  40 mg Oral Daily   Continuous Infusions: PRN Meds:.acetaminophen **OR** acetaminophen, hydrALAZINE, HYDROmorphone (DILAUDID) injection, naLOXone (NARCAN)  injection, ondansetron, ondansetron (ZOFRAN) IV   Santiago Bumpers,  MD 02/09/2023, 9:33 AM

## 2023-02-09 NOTE — Plan of Care (Signed)

## 2023-02-09 NOTE — Plan of Care (Signed)

## 2023-02-09 NOTE — Progress Notes (Signed)
Patient s/p right IJ tunneled HD catheter placement 02/06/23 in IR with continued bleeding from tract over weekend and today pre and post HD. It required at least one dressing change in HD today. No pressure held at IJ/insertion by HD RN per chart review. Patient receives heparin during HD. Per patient she has history of prolonged bleeding and easy bruising but is not sure why.   Patient seen on 5W, catheter continues to ooze - appears to be coming from underside of catheter. Copious clot on catheter itself. Patient is very tender to even minimal palpation along the entirety of the tract. Manual pressure held at insertion site and right IJ x 10 minutes with slowing of bleeding however small amount of oozing in the same area noted. Gel foam placed within tract which further slowed oozing. One piece of quickclot was wrapped around the underside of the catheter and an additional quickclot dressing placed over the insertion site - the site was observed for an additional 3-4 minutes without further oozing noted. A 4x4 gauze placed over the quickclot and sealed with tegaderm.  Plan: - Remove Quickclot within 24 hours and place regular dressing (bedside RN aware, will contact IV team). Recommend waiting at least 2 hours before removal.  - If bleeding persists lay patient flat and hold firm pressure at insertion site and right IJ for at least 15 minutes. Pressure dressing is not sufficient.  - If bleeding persists repeat holding firm pressure at both IJ and insertion site for additional 15 minutes - If bleeding persists despite above consider DDAVP and page on call interventional radiologist for further evaluation  Candiss Norse, PA-C

## 2023-02-09 NOTE — Progress Notes (Signed)
I have spoken with the patient this morning and let her know we are figuring out surgical availability for fistula creation. Potentially could be later this week unless she gets a dialysis spot, then we can schedule for outpatient surgery.   Continue limb alert on left arm.  Vicente Serene, PA-C [facility] 02/09/2023, 7:49AM

## 2023-02-09 NOTE — Progress Notes (Signed)
HD catheter continue to bleed through the night.  About 1/2 of ABD pad got saturated with blood last night. IR called, and this RN spoke with PA. PA aware of bleeding, and she will go to HD this morning to assess pt, and make appropriate recommendation. Per PA, most likely there is not much we can do it at this point, but she will reevaluate.

## 2023-02-09 NOTE — Progress Notes (Signed)
Please place limb alert on the left arm for possible fistula creation. Timing pending availability.  Broadus John MD

## 2023-02-09 NOTE — Progress Notes (Signed)
PROGRESS NOTE    Tricia Huynh  [id number] DOB: 07/27/1956 DOA: 02/02/2023 PCP: Tricia Jordan, MD    Brief Narrative:  Patient is a 67 years old female with past medical history of type 2 diabetes, CKD stage IV presented to the hospital with chest pain, nausea, vomiting.  Initial thought to be GERD but labs showed creatinine of 4.4 from baseline 2.8.  Acute elevation in creatinine was thought to be secondary intravascular depletion from nausea vomiting and decreased oral intake.  Patient was then admitted hospital for further evaluation and treatment.  Nephrology was consulted and at this time patient has undergone hemodialysis catheter placement and hemodialysis has been initiated.  Vascular surgery has been consulted for vascular access creation.  Further plan as per nephrology regarding outpatient dialysis.   Assessment and Plan:  Acute on chronic kidney disease CKD 4-5 Thought to be secondary to intravascular volume depletion.  Baseline creatinine of 2.2-2.9 from 2023.   Since patient had uremic symptoms, nephrology consulted and plan was to proceed with hemodialysis.  Patient underwent  hemodialysis catheter placement and initiation of hemodialysis on 02/07/2023.  Further plans as per nephrology.  Will need hemodialysis chair as outpatient.  Vascular surgery involved for fistula creation.   Chest pain/nausea/vomiting likely secondary to GERD Resolved.  On PPI.    Mild hypokalemia.  Potassium 3.5 today.  Mild hypomagnesemia.  Will give magnesium oxide x 1.   Diabetes mellitus type 2.   Latest hemoglobin A1c of 7.0 from 02/05/2023. Continue sliding scale. long acting insulin on hold at this time.  Latest POC glucose of 140.  Anemia likely secondary to chronic kidney disease. -hemoglobin 8.0 from initial 6.4 after 1 unit of packed RBC.    IV iron/EPO as per nephrology.  Anemia panel showed iron of 44 with TIBC of 251.  Check hemoglobin in AM.    HTN On Coreg and amlodipine  and as needed hydralazine   Seizure disorder Continue Keppra    DVT prophylaxis: Place TED hose Start: 02/02/23 1720   Code Status:     Code Status: Full Code  Disposition:  Home likely in 2 to 3 days.  Will likely need hemodialysis set up as outpatient.  Status is: Inpatient  Remains inpatient appropriate because: Uremia, need for hemodialysis,   Family Communication:  Spoke with the patient's granddaughter Ms. Tricia Huynh on the phone and updated her about the clinical condition of the patient.  Consultants:  Nephrology  Procedures:  Ultrasound-guided central catheter placement for hemodialysis.Marland Kitchen   Ultrasound venous mapping Hemodialysis  Antimicrobials:  None  Anti-infectives (From admission, onward)    Start     Dose/Rate Route Frequency Ordered Stop   02/06/23 0930  ceFAZolin (ANCEF) IVPB 2g/100 mL premix        2 g 200 mL/hr over 30 Minutes Intravenous To Radiology 02/06/23 0835 02/06/23 0920      Subjective: Today, patient was seen and examined at bedside.  Seen during hemodialysis.  Complains of mild discomfort at the hemodialysis catheter site with some bleeding reported yesterday.  Nausea and decreased appetite.  Feels fatigued and weak.  Objective: Vitals:   02/09/23 0840 02/09/23 0849 02/09/23 0850 02/09/23 0939  BP: (!) 170/90  (!) 166/86 137/86  Pulse: 82  82 81  Resp: 12  12 12   Temp: 98.1 F (36.7 C)     TempSrc: Oral     SpO2: 98%   96%  Weight:  51.5 kg    Height:  Intake/Output Summary (Last 24 hours) at 02/09/2023 1013 Last data filed at 02/09/2023 0500 Gross per 24 hour  Intake --  Output 700 ml  Net -700 ml    Filed Weights   02/07/23 1125 02/07/23 1130 02/09/23 0849  Weight: 52 kg 52 kg 51.5 kg    Physical Examination: Body mass index is 17.26 kg/m.   General: Alert awake and Communicative, thinly built, not in obvious distress, HENT: Mild pallor noted.  Oral mucosa is moist.  Chest: Clear breath sounds bilaterally.   Right chest wall hemodialysis catheter in place with some hemorrhage and pressure dressing.. CVS: S1 &S2 heard. No murmur.  Regular rate and rhythm. Abdomen: Soft, nontender, nondistended.  Bowel sounds are heard.   Extremities: No cyanosis, clubbing or edema.  Peripheral pulses are palpable. Psych: Alert, awake and oriented, normal mood, CNS:  No cranial nerve deficits.  Generalized weakness noted. Skin: Warm and dry.  No rashes noted.  Data Reviewed:   CBC: Recent Labs  Lab 02/02/23 1351 02/04/23 0350 02/04/23 1801 02/08/23 0330 02/09/23 0659  WBC 6.1 4.9  --  5.7 5.7  NEUTROABS 4.1  --   --   --   --   HGB 7.3* 6.4* 8.2* 8.0* 8.0*  HCT 23.1* 20.9* 25.3* 24.6* 24.8*  MCV 92.0 94.6  --  89.8 90.2  PLT 204 183  --  177 179     Basic Metabolic Panel: Recent Labs  Lab 02/05/23 0419 02/06/23 0604 02/07/23 0307 02/08/23 0330 02/09/23 0659  NA 138 140 135 136 136  K 4.1 3.9 3.6 3.4* 3.5  CL 116* 117* 105 102 104  CO2 17* 15* 21* 28 25  GLUCOSE 140* 143* 178* 159* 137*  BUN 39* 37* 19 14 17   CREATININE 4.43* 4.13* 2.95* 3.01* 3.68*  CALCIUM 8.3* 8.6* 8.2* 7.7* 8.3*  MG  --   --   --  1.5* 1.6*     Liver Function Tests: Recent Labs  Lab 02/03/23 0415  AST 16  ALT 12  ALKPHOS 88  BILITOT 0.5  PROT 6.2*  ALBUMIN 2.7*      Radiology Studies: VAS [place] UPPER EXT VEIN MAPPING (PRE-OP AVF)  Result Date: 02/09/2023 UPPER EXTREMITY VEIN MAPPING Patient Name:  Tricia Huynh  Date of Exam:   02/07/2023 Medical Rec #: [id number]               Accession #:    [id number] Date of Birth: 1955/12/06               Patient Gender: F Patient Age:   8 years Exam Location:  [facility] Procedure:      VAS [place] UPPER EXT VEIN MAPPING (PRE-OP AVF) Referring Phys: Dagoberto Ligas --------------------------------------------------------------------------------  Indications: Pre-access. History: ESRD.  Limitations: IV/bandages right arm. Body habitus (BMI 17.43) Comparison  Study: No prior study Performing Technologist: Sharion Dove RVS  Examination Guidelines: A complete evaluation includes B-mode imaging, spectral Doppler, color Doppler, and power Doppler as needed of all accessible portions of each vessel. Bilateral testing is considered an integral part of a complete examination. Limited examinations for reoccurring indications may be performed as noted. +-----------------+-------------+----------+--------------+ Right Cephalic   Diameter (cm)Depth (cm)   Findings    +-----------------+-------------+----------+--------------+ Prox upper arm       0.30        0.21                  +-----------------+-------------+----------+--------------+ Mid upper arm  0.32        0.31                  +-----------------+-------------+----------+--------------+ Dist upper arm       0.46        0.37      thrombus    +-----------------+-------------+----------+--------------+ Antecubital fossa    0.61        0.22      thrombus    +-----------------+-------------+----------+--------------+ Prox forearm                            not visualized +-----------------+-------------+----------+--------------+ Mid forearm                             not visualized +-----------------+-------------+----------+--------------+ Dist forearm                            not visualized +-----------------+-------------+----------+--------------+ Wrist                                   not visualized +-----------------+-------------+----------+--------------+ +-----------------+-------------+----------+--------+ Right Basilic    Diameter (cm)Depth (cm)Findings +-----------------+-------------+----------+--------+ Prox upper arm       0.39        0.89            +-----------------+-------------+----------+--------+ Mid upper arm        0.42        0.89            +-----------------+-------------+----------+--------+ Dist upper arm       0.24         0.42            +-----------------+-------------+----------+--------+ Antecubital fossa    0.18        0.20            +-----------------+-------------+----------+--------+ Prox forearm         0.22        0.17            +-----------------+-------------+----------+--------+ Mid forearm          0.16        0.17            +-----------------+-------------+----------+--------+ Wrist                0.18        0.17            +-----------------+-------------+----------+--------+ +-----------------+-------------+----------+-----------------------+ Left Cephalic    Diameter (cm)Depth (cm)       Findings         +-----------------+-------------+----------+-----------------------+ Prox upper arm       0.36        0.49                           +-----------------+-------------+----------+-----------------------+ Mid upper arm        0.30        0.31                           +-----------------+-------------+----------+-----------------------+ Dist upper arm       0.33        0.20                           +-----------------+-------------+----------+-----------------------+  Antecubital fossa    0.46        0.24   branching just below AC +-----------------+-------------+----------+-----------------------+ Prox forearm         0.17        0.20                           +-----------------+-------------+----------+-----------------------+ Mid forearm          0.19        0.14                           +-----------------+-------------+----------+-----------------------+ Wrist                0.19        0.20                           +-----------------+-------------+----------+-----------------------+ +-----------------+-------------+----------+--------+ Left Basilic     Diameter (cm)Depth (cm)Findings +-----------------+-------------+----------+--------+ Prox upper arm       0.39        0.57            +-----------------+-------------+----------+--------+  Mid upper arm        0.38        0.54            +-----------------+-------------+----------+--------+ Antecubital fossa    0.30        0.20            +-----------------+-------------+----------+--------+ Prox forearm         0.34        0.16            +-----------------+-------------+----------+--------+ Mid forearm          0.31        0.16            +-----------------+-------------+----------+--------+ Wrist                0.15        0.19            +-----------------+-------------+----------+--------+ *See table(s) above for measurements and observations.  Diagnosing physician: Orlie Pollen Electronically signed by Orlie Pollen on 02/09/2023 at 3:35:26 AM.    Final       LOS: 7 days    Flora Lipps, MD [facility] Available via Epic secure chat 7am-7pm After these hours, please refer to coverage provider listed on [email or web address] 02/09/2023, 10:13 AM

## 2023-02-09 NOTE — Progress Notes (Signed)
New Dialysis Start    Patient identified as new dialysis start. Kidney Education packet assembled and given. Discussed the following items with patient:     Current medications and possible changes once started:  Discussed that patient's medications may change over time.  Ex; hypertension medications and diabetes medication.  Nephrologists will adjust as needed.   Fluid restrictions reviewed:  32 oz daily goal:  All liquids count; soups, ice, jello, fruits.   Phosphorus and potassium: Handout given showing high potassium and phosphorus foods.  Alternative food and drink options given.   Family support:  none at bedside   Outpatient Clinic Resources:  Discussed roles of Outpatient clinic staff and advised to make a list of needs, if any, to talk with outpatient staff if needed.   Care plan schedule: Informed patient of Care Plans in outpatient setting and to participate in the care plan.  An invitation would be given from outpatient clinic.    Dialysis Access Options:  Reviewed access options with patients. Discussed in detail about care at home with new AVG & AVF. Reviewed checking bruit and thrill. AVF/G scheduled for 3/28. If dialysis catheter present, educated that patient could not take showers.  Catheter dressing changes were to be done by outpatient clinic staff only.   Home therapy options:  Educated patient about home therapy options:  PD vs home hemo.  Patient interested in TCU program.   Patient verbalized understanding. Will continue to round on patient during admission.    Aurther Loft Dialysis Nurse Coordinator [phone]

## 2023-02-09 NOTE — Progress Notes (Signed)
Nephrology Follow-Up Consult note   Assessment/Recommendations: Tricia Huynh is a/an 67 y.o. female with a past medical history significant for CKD 4, admitted for AKI.       AKI on CKD 4 progressed to ESRD - b/l creat 2.2- 2.9 from late 2023, eGFR 17- 24 ml/min. Creat here was 4.4 here w/ signs c/w ATN. Crt did not improve and discussed w/ outpt MD who agreed on initiating HD and likely progressed to ESRD. TDC placed. VVS consulted for access creation 3rd initiation HD today. CLIP starting today.  HTN - cont coreg/ norvasc Anemia esrd - Hgb 8s with low iron. Continue IV iron replacement and ESA.  Chest pain - prob GERD per pmd.  IDDM - per pmd     Recommendations conveyed to primary service.    [facility] 02/09/2023 9:41 AM  ___________________________________________________________  CC: AKI on CKD 4  Interval History/Subjective: Patient with no complaints.  Is having some bleeding from her catheter insertion site.   Medications:  Current Facility-Administered Medications  Medication Dose Route Frequency Provider Last Rate Last Admin   acetaminophen (TYLENOL) tablet 650 mg  650 mg Oral Q6H PRN Emilee Hero, MD   650 mg at 02/06/23 1755   Or   acetaminophen (TYLENOL) suppository 650 mg  650 mg Rectal Q6H PRN Emilee Hero, MD       amLODipine (NORVASC) tablet 10 mg  10 mg Oral Daily Emilee Hero, MD   10 mg at 02/08/23 0844   atorvastatin (LIPITOR) tablet 40 mg  40 mg Oral Daily Emilee Hero, MD   40 mg at 02/08/23 0844   carvedilol (COREG) tablet 25 mg  25 mg Oral BID WC Emilee Hero, MD   25 mg at 02/08/23 1720   Chlorhexidine Gluconate Cloth 2 % PADS 6 each  6 each Topical Q0600 Roney Jaffe, MD   6 each at 02/09/23 0431   Darbepoetin Alfa (ARANESP) injection 40 mcg  40 mcg Subcutaneous Q Sat-1800 Roney Jaffe, MD   40 mcg at 02/07/23 1737   heparin sodium (porcine) injection 2,000 Units  2,000 Units  Intravenous Once Roney Jaffe, MD       hydrALAZINE (APRESOLINE) injection 10 mg  10 mg Intravenous Q4H PRN Howerter, Justin B, DO       hydrALAZINE (APRESOLINE) tablet 50 mg  50 mg Oral Q8H Pokhrel, Laxman, MD   50 mg at 02/08/23 2201   HYDROmorphone (DILAUDID) injection 0.5 mg  0.5 mg Intravenous Q2H PRN Howerter, Justin B, DO   0.5 mg at 02/09/23 0320   insulin aspart (novoLOG) injection 0-24 Units  0-24 Units Subcutaneous TID WC Emilee Hero, MD   8 Units at 02/08/23 1314   levETIRAcetam (KEPPRA) tablet 500 mg  500 mg Oral BID Emilee Hero, MD   500 mg at 02/08/23 2201   naloxone [facility]) injection 0.4 mg  0.4 mg Intravenous PRN Howerter, Justin B, DO       ondansetron (ZOFRAN) 4 MG/2ML injection            ondansetron (ZOFRAN) injection 4 mg  4 mg Intravenous Q6H PRN Howerter, Justin B, DO   4 mg at 02/09/23 0848   pantoprazole (PROTONIX) EC tablet 40 mg  40 mg Oral Daily Emilee Hero, MD   40 mg at 02/08/23 0844      Review of Systems: 10 systems reviewed and negative except per interval history/subjective  Physical Exam: Vitals:   02/09/23 0850 02/09/23 0939  BP: (!) 166/86  137/86  Pulse: 82 81  Resp: 12 12  Temp:    SpO2:  96%   No intake/output data recorded.  Intake/Output Summary (Last 24 hours) at 02/09/2023 0941 Last data filed at 02/09/2023 0500 Gross per 24 hour  Intake --  Output 700 ml  Net -700 ml   Constitutional: thin, no acute distress ENMT: ears and nose without scars or lesions, MMM CV: normal rate, no edema Respiratory: bilateral chest rise, normal work of breathing Gastrointestinal: soft, non-tender, no palpable masses or hernias Skin: no visible lesions or rashes Psych: alert, judgement/insight appropriate, appropriate mood and affect Access: Right chest catheter with oozing blood   Test Results I personally reviewed new and old clinical labs and radiology tests Lab Results  Component Value Date   NA 136 02/09/2023   K 3.5  02/09/2023   CL 104 02/09/2023   CO2 25 02/09/2023   BUN 17 02/09/2023   CREATININE 3.68 (H) 02/09/2023   GFR 56.09 (L) 12/24/2016   CALCIUM 8.3 (L) 02/09/2023   ALBUMIN 2.7 (L) 02/03/2023   PHOS 4.3 09/20/2022    CBC Recent Labs  Lab 02/02/23 1351 02/04/23 0350 02/04/23 1801 02/08/23 0330 02/09/23 0659  WBC 6.1 4.9  --  5.7 5.7  NEUTROABS 4.1  --   --   --   --   HGB 7.3* 6.4* 8.2* 8.0* 8.0*  HCT 23.1* 20.9* 25.3* 24.6* 24.8*  MCV 92.0 94.6  --  89.8 90.2  PLT 204 183  --  177 179

## 2023-02-09 NOTE — Progress Notes (Signed)
Pt. HD catheter has Blood clots and active bleeding. Pt drsg was change and blood clots was behind the pt. Back and clots on gown and sheet. Pt. Got a complete bed  change. Gave Floor RN report Delana Meyer concerning the pt. Bleeding from the catheter. Spoke with MD Joylene Grapes  is aware of pt.bleeding from Catheter.

## 2023-02-09 NOTE — Progress Notes (Signed)
Pt has completed hemodialysis today at 1230 pm. Per Delorise Jackson, dialysis RN, the dialysis unit director re-dressed pt's dialysis catheter during pt's HD session, and HD catheter is still bleeding a lot of blood. Vital signs are stable s/p HD. This Probation officer called IR and spoke to [place], NP and Manuela Schwartz stated someone from IR will come shortly to assess HD catheter.

## 2023-02-09 NOTE — Progress Notes (Signed)
   02/09/23 1237  Vitals  Temp 98.5 F (36.9 C)  Temp Source Oral  BP (!) 163/98  MAP (mmHg) 116  Pulse Rate 84  Resp 18  Level of Consciousness  Level of Consciousness Alert  MEWS COLOR  MEWS Score Color Green  Oxygen Therapy  SpO2 98 %  O2 Device Room Air  Patient Activity (if Appropriate) In bed  Pulse Oximetry Type Continuous  Pain Assessment  Pain Scale 0-10  Pain Score 0  PCA/Epidural/Spinal Assessment  Respiratory Pattern Regular;Unlabored  Height and Weight  Weight 49.1 kg  Type of Scale Used Bed  BMI (Calculated) 16.46  ECG Monitoring  Cardiac Rhythm NSR  MEWS Score  MEWS Temp 0  MEWS Systolic 0  MEWS Pulse 0  MEWS RR 0  MEWS LOC 0  MEWS Score 0   Received patient in bed to unit.  Alert and oriented.  Informed consent signed and in chart.   TX duration:3.25  Patient tolerated well.  Transported back to the room  Alert, without acute distress.  Hand-off given to patient's nurse.   Access used: Yes Access issues: Blood Clotted from HD catheter,catheter bleeding from under the arm and behind the catheter  Total UF removed: 2000 Medication(s) given: Iron ferric 250mg  IV and heparin 3800 units for HD catheter Post HD VS: See above Grid Post HD weight: 49.1kg   [facility]

## 2023-02-09 NOTE — Progress Notes (Signed)
Requested to see pt for out-pt HD needs at d/c. Met with pt at bedside while pt receiving HD this morning. Introduced self and explained role. Discussed out-pt HD options. Pt voiced interest in TCU (HD on Mon, Tues, Thurs,Fri) for education regarding HD. Referral submitted to [facility] admissions for possible TCU chair. Prior to admission, pt states that granddaughter assist with transportation to some MD appts and that pt sometimes uses transportation through [facility] for appts. This info was provided to CSW in the event pt needed additional resources for transportation services. Will assist as needed.   Melven Sartorius Renal Navigator [phone]

## 2023-02-09 NOTE — Progress Notes (Signed)
  Daily Progress Note    Subjective: No complaints  Objective: Vitals:   02/09/23 1237 02/09/23 1317  BP: (!) 163/98 (!) 162/100  Pulse: 84 82  Resp: 18 17  Temp: 98.5 F (36.9 C)   SpO2: 98% 96%    Physical Examination Palpable pulses at the wrist, Ooze from [facility] controlled in right chest with pressure dressing  ASSESSMENT/PLAN:  Pt with end stage renal disease in need of long-term HD access. After discussing the risk and benefits of left-sided brachiocephalic fistula creation, Tricia Huynh elected to proceed.  Will plan for Thursday  Please make n.p.o. Wednesday night.   Cassandria Santee MD [facility] [phone] 02/09/2023  2:58 PM

## 2023-02-10 DIAGNOSIS — N179 Acute kidney failure, unspecified: Secondary | ICD-10-CM | POA: Diagnosis not present

## 2023-02-10 DIAGNOSIS — R569 Unspecified convulsions: Secondary | ICD-10-CM | POA: Diagnosis not present

## 2023-02-10 DIAGNOSIS — I10 Essential (primary) hypertension: Secondary | ICD-10-CM | POA: Diagnosis not present

## 2023-02-10 DIAGNOSIS — E0849 Diabetes mellitus due to underlying condition with other diabetic neurological complication: Secondary | ICD-10-CM | POA: Diagnosis not present

## 2023-02-10 LAB — GLUCOSE, CAPILLARY
Glucose-Capillary: 111 mg/dL — ABNORMAL HIGH (ref 70–99)
Glucose-Capillary: 98 mg/dL (ref 70–99)
Glucose-Capillary: 184 mg/dL — ABNORMAL HIGH (ref 70–99)

## 2023-02-10 LAB — BASIC METABOLIC PANEL
Anion gap: 10 (ref 5–15)
BUN: 13 mg/dL (ref 8–23)
CO2: 26 mmol/L (ref 22–32)
Calcium: 8.3 mg/dL — ABNORMAL LOW (ref 8.9–10.3)
Chloride: 98 mmol/L (ref 98–111)
Creatinine, Ser: 3.21 mg/dL — ABNORMAL HIGH (ref 0.44–1.00)
GFR, Estimated: 15 mL/min — ABNORMAL LOW (ref >60)
Glucose, Bld: 148 mg/dL — ABNORMAL HIGH (ref 70–99)
Potassium: 3.8 mmol/L (ref 3.5–5.1)
Sodium: 134 mmol/L — ABNORMAL LOW (ref 135–145)

## 2023-02-10 LAB — CBC
HCT: 22.5 % — ABNORMAL LOW (ref 36.0–46.0)
Hemoglobin: 7.5 g/dL — ABNORMAL LOW (ref 12.0–15.0)
MCH: 30 pg (ref 26.0–34.0)
MCHC: 33.3 g/dL (ref 30.0–36.0)
MCV: 90 fL (ref 80.0–100.0)
Platelets: 179 10*3/uL (ref 150–400)
RBC: 2.5 MIL/uL — ABNORMAL LOW (ref 3.87–5.11)
RDW: 14.6 % (ref 11.5–15.5)
WBC: 5.5 10*3/uL (ref 4.0–10.5)
nRBC: 0 % (ref 0.0–0.2)

## 2023-02-10 MED ORDER — DARBEPOETIN ALFA 100 MCG/0.5ML IJ SOSY: 100.0000 ug | PREFILLED_SYRINGE | INTRAMUSCULAR | Status: DC

## 2023-02-10 MED ORDER — RENA-VITE PO TABS
1.0000 | ORAL_TABLET | Freq: Every day | ORAL | Status: DC
  Administered 2023-02-10 – 2023-02-11 (×2): 1 via ORAL
  Filled 2023-02-10 (×2): qty 1

## 2023-02-10 NOTE — Progress Notes (Signed)
[place] dressing assessed and changed by PA with radiology yesterday 3/25. Quickclot applied. Dressing has continued to ooze overnight. Plan to change dressing 3/27 in order to prevent clot removal and continued bleeding.   Zyairah Wacha Lorita Officer, RN

## 2023-02-10 NOTE — Progress Notes (Signed)
Pt has been accepted at Transitional Care Unit at [facility]) on Monday, Tuesday, Thursday, Friday with 7:30 am chair time. Pt can start as soon as Thursday and will need to arrive at 7:00 am on first day to complete paperwork prior to treatment. Met with pt at bedside to discuss above details. Pt provided schedule letter and agreeable to plan. Arrangements added to AVS as well. Update provided to attending, nephrologist, RN CM, and CSW. Will assist as needed.   Melven Sartorius Renal Navigator [phone]

## 2023-02-10 NOTE — Plan of Care (Signed)

## 2023-02-10 NOTE — Progress Notes (Signed)
HD dressing assessed as consulted, gauze dressing w/ old blood clots present, No active bleeding noted as of this time. Dressing re enforced w/ tegaderm. Per note, quickclot was applied on the 3/25 and dressing should not be be removed until 3/27. RN notified and asked to continue to monitor for any active bleeding and call [facility] for assistance.

## 2023-02-10 NOTE — Progress Notes (Signed)
  Progress Note    02/10/2023 8:39 AM * No surgery date entered *  Subjective:  resting comfortably    Vitals:   02/10/23 0400 02/10/23 0800  BP: 131/67 (!) 149/81  Pulse: 70 76  Resp: 12 14  Temp: 98.2 F (36.8 C) 98.1 F (36.7 C)  SpO2: 97% 97%    Physical Exam: Cardiac:  regular Lungs:  nonlabored Extremities:  palpable radial and brachial pulses bilaterally   CBC    Component Value Date/Time   WBC 5.5 02/10/2023 0548   RBC 2.50 (L) 02/10/2023 0548   HGB 7.5 (L) 02/10/2023 0548   HCT 22.5 (L) 02/10/2023 0548   PLT 179 02/10/2023 0548   MCV 90.0 02/10/2023 0548   MCH 30.0 02/10/2023 0548   MCHC 33.3 02/10/2023 0548   RDW 14.6 02/10/2023 0548   LYMPHSABS 1.5 02/02/2023 1351   MONOABS 0.5 02/02/2023 1351   EOSABS 0.0 02/02/2023 1351   BASOSABS 0.0 02/02/2023 1351    BMET    Component Value Date/Time   NA 134 (L) 02/10/2023 0548   K 3.8 02/10/2023 0548   CL 98 02/10/2023 0548   CO2 26 02/10/2023 0548   GLUCOSE 148 (H) 02/10/2023 0548   BUN 13 02/10/2023 0548   CREATININE 3.21 (H) 02/10/2023 0548   CALCIUM 8.3 (L) 02/10/2023 0548   GFRNONAA 15 (L) 02/10/2023 0548   GFRAA >60 08/20/2020 0020    INR    Component Value Date/Time   INR 1.2 07/25/2022 0325     Intake/Output Summary (Last 24 hours) at 02/10/2023 0839 Last data filed at 02/10/2023 0432 Gross per 24 hour  Intake 486.74 ml  Output --  Net 486.74 ml      Assessment/Plan:  67 y.o. female in need of permanent ESRD access   -Had some issues with bleeding from her [facility] yesterday. HD had to apply quick clot and a new bandage. Their plan is dressing change on 3/27  -The patient is still agreeable to surgery on Thursday for left arm fistula creation. All of her questions were answered -Will make NPO at midnight tomorrow  Vicente Serene, [place] [facility] [phone] 02/10/2023 8:39 AM

## 2023-02-10 NOTE — Progress Notes (Signed)
Initial Nutrition Assessment  DOCUMENTATION CODES:   Non-severe (moderate) malnutrition in context of chronic illness, Underweight  INTERVENTION:   Renal Multivitamin w/ minerals daily Liberalize pt diet to regular due to malnutrition. Continue 1.2 L fluid restriction Encourage good PO intake  Meal ordering with assist  Renal diet education  NUTRITION DIAGNOSIS:   Moderate Malnutrition related to chronic illness as evidenced by moderate muscle depletion, mild fat depletion.  GOAL:   Patient will meet greater than or equal to 90% of their needs  MONITOR:   PO intake, Supplement acceptance, Labs, Weight trends, I & O's  REASON FOR ASSESSMENT:   Consult Diet education  ASSESSMENT:   67 y.o. female presented to the ED with chest burning. PMH Includes T2DM, HLD, HTN, CHF, and CKD. Pt admitted with AKI on CKD IV.   Pt reports that her appetite was not good PTA, endorses that it is much better now. Denies any nausea or vomiting. Pt denies any recent weight loss. Per EMR, pt with 17% weight loss over the past 11 months. Although, unable to determine actual weight loss from fluid loss.   Provides pt with "Nutrition for Dialysis" handout. Reviewed nutrition recommendations with pt. Encouraged pt to prioritize protein, monitor fluid, sodium, potassium, and phosphorus intake. Encouraged pt to utilize RD at the outpatient dialysis center. Discussed alternative ways to treat hypoglycemia in place of orange juice. Reviewed checking the food label for added phosphorus in foods and drinks.   Meal Intake  3/20-3/26: 75-100% x 8 meals  Per notes, plan for fistula placement on Thursday.   Medications reviewed and include: NovoLog SSI, Protonix Labs reviewed: Sodium 134, Potassium 3.8, Magnesium 1.6 (02/09/23), Hgb A1c 7%, 24 hr CBGs 98-191  NUTRITION - FOCUSED PHYSICAL EXAM:  Flowsheet Row Most Recent Value  Orbital Region Mild depletion  Upper Arm Region Mild depletion  Thoracic and  Lumbar Region No depletion  Buccal Region Mild depletion  Temple Region No depletion  Clavicle Bone Region Moderate depletion  Clavicle and Acromion Bone Region Moderate depletion  Scapular Bone Region Moderate depletion  Dorsal Hand Moderate depletion  Patellar Region Moderate depletion  Anterior Thigh Region Moderate depletion  Posterior Calf Region Moderate depletion  Edema (RD Assessment) None  Hair Reviewed  Eyes Reviewed  Mouth Reviewed  Skin Reviewed  Nails Reviewed   Diet Order:   Diet Order             Diet NPO time specified Except for: Sips with Meds  Diet effective midnight           Diet regular Room service appropriate? Yes with Assist; Fluid consistency: Thin; Fluid restriction: 1200 mL Fluid  Diet effective now                  EDUCATION NEEDS:   Education needs have been addressed  Skin:  Skin Assessment: Reviewed RN Assessment  Last BM:  3/23  Height:  Ht Readings from Last 1 Encounters:  02/02/23 5\' 8"  (1.727 m)   Weight:  Wt Readings from Last 1 Encounters:  02/10/23 49 kg   Ideal Body Weight:  63.6 kg  BMI:  Body mass index is 16.43 kg/m.  Estimated Nutritional Needs:  Kcal:  1600-1800 Protein:  70-90 grams Fluid:  1L + UOP   Hermina Barters RD, LDN Clinical Dietitian See [facility] for contact information.

## 2023-02-10 NOTE — Progress Notes (Addendum)
PROGRESS NOTE        PATIENT DETAILS Name: Tricia Huynh Age: 67 y.o. Sex: female Date of Birth: [birth year] Admit Date: 02/02/2023 Admitting Physician Emilee Hero, MD [id number], Ivin Booty, MD  Brief Summary: Patient is a 67 y.o.  female with history of CKD 4, DM-2 who presented with nausea/vomiting-she was found to have AKI that unfortunately did not get better with supportive care-she is now thought to have ESRD.  See below for further details.    Significant events: 3/18>> admit to [facility]  Significant studies: 3/18>> CXR: No PNA 3/18>> renal ultrasound: No hydronephrosis  Significant microbiology data: None  Procedures: 3/22>> TDC by IR  Consults: Nephrology IR Vascular surgery  Subjective: Lying comfortably in bed-denies any chest pain or shortness of breath.  Objective: Vitals: Blood pressure (!) 143/80, pulse 76, temperature 98.1 F (36.7 C), temperature source Oral, resp. rate 14, height 5\' 8"  (1.727 m), weight 49 kg, SpO2 97 %.   Exam: Gen Exam:Alert awake-not in any distress HEENT:atraumatic, normocephalic Chest: B/L clear to auscultation anteriorly CVS:S1S2 regular Abdomen:soft non tender, non distended Extremities:no edema Neurology: Non focal Skin: no rash  Pertinent Labs/Radiology:    Latest Ref Rng & Units 02/10/2023    5:48 AM 02/09/2023    6:59 AM 02/08/2023    3:30 AM  CBC  WBC 4.0 - 10.5 K/uL 5.5  5.7  5.7   Hemoglobin 12.0 - 15.0 g/dL 7.5  8.0  8.0   Hematocrit 36.0 - 46.0 % 22.5  24.8  24.6   Platelets 150 - 400 K/uL 179  179  177     Lab Results  Component Value Date   NA 134 (L) 02/10/2023   K 3.8 02/10/2023   CL 98 02/10/2023   CO2 26 02/10/2023      Assessment/Plan: AKI on CKD 4 with progression to ESRD Nephrology following [facility] placed by IR 3/22 Vascular surgery planning aVF Thursday Discharge planning in progress  Normocytic anemia Secondary to CKD Adna/iron therapy defer to  nephrology Follow CBC periodically  GERD PPI  DM-2 (A1c 7.0 on 3/21) CBGs stable with SSI  Recent Labs    02/09/23 0754 02/09/23 1337 02/09/23 2132  GLUCAP 140* 111* 191*    HTN BP stable Coreg/amlodipine  HLD Statin  Seizure disorder Keppra  Underweight: Estimated body mass index is 16.43 kg/m as calculated from the following:   Height as of this encounter: 5\' 8"  (1.727 m).   Weight as of this encounter: 49 kg.   Code status:   Code Status: Full Code   DVT Prophylaxis: Place TED hose Start: 02/02/23 1720   Family Communication: None at bedside  Disposition Plan: Status is: Inpatient Remains inpatient appropriate because: Severity of illness   Planned Discharge Destination:Home   Diet: Diet Order             Diet NPO time specified Except for: Sips with Meds  Diet effective midnight           Diet renal with fluid restriction Fluid restriction: 1200 mL Fluid; Room service appropriate? Yes; Fluid consistency: Thin  Diet effective now                     Antimicrobial agents: Anti-infectives (From admission, onward)    Start     Dose/Rate Route Frequency Ordered Stop   02/06/23 0930  ceFAZolin (ANCEF) IVPB 2g/100 mL premix        2 g 200 mL/hr over 30 Minutes Intravenous To Radiology 02/06/23 0835 02/06/23 0920        MEDICATIONS: Scheduled Meds:  amLODipine  10 mg Oral Daily   atorvastatin  40 mg Oral Daily   carvedilol  25 mg Oral BID WC   Chlorhexidine Gluconate Cloth  6 each Topical Q0600   [START ON 02/14/2023] darbepoetin (ARANESP) injection - DIALYSIS  100 mcg Subcutaneous Q Sat-1800   heparin sodium (porcine)  2,000 Units Intravenous Once   hydrALAZINE  50 mg Oral Q8H   insulin aspart  0-24 Units Subcutaneous TID WC   levETIRAcetam  500 mg Oral BID   pantoprazole  40 mg Oral Daily   Continuous Infusions:  ferric gluconate (FERRLECIT) IVPB 250 mg (02/10/23 1147)   PRN Meds:.acetaminophen **OR** acetaminophen, hydrALAZINE,  HYDROmorphone (DILAUDID) injection, naLOXone (NARCAN)  injection, ondansetron (ZOFRAN) IV   I have personally reviewed following labs and imaging studies  LABORATORY DATA: CBC: Recent Labs  Lab 02/04/23 0350 02/04/23 1801 02/08/23 0330 02/09/23 0659 02/10/23 0548  WBC 4.9  --  5.7 5.7 5.5  HGB 6.4* 8.2* 8.0* 8.0* 7.5*  HCT 20.9* 25.3* 24.6* 24.8* 22.5*  MCV 94.6  --  89.8 90.2 90.0  PLT 183  --  177 179 0000000    Basic Metabolic Panel: Recent Labs  Lab 02/06/23 0604 02/07/23 0307 02/08/23 0330 02/09/23 0659 02/10/23 0548  NA 140 135 136 136 134*  K 3.9 3.6 3.4* 3.5 3.8  CL 117* 105 102 104 98  CO2 15* 21* 28 25 26   GLUCOSE 143* 178* 159* 137* 148*  BUN 37* 19 14 17 13   CREATININE 4.13* 2.95* 3.01* 3.68* 3.21*  CALCIUM 8.6* 8.2* 7.7* 8.3* 8.3*  MG  --   --  1.5* 1.6*  --     GFR: Estimated Creatinine Clearance: 13.3 mL/min (A) (by C-G formula based on SCr of 3.21 mg/dL (H)).  Liver Function Tests: No results for input(s): "AST", "ALT", "ALKPHOS", "BILITOT", "PROT", "ALBUMIN" in the last 168 hours. No results for input(s): "LIPASE", "AMYLASE" in the last 168 hours. No results for input(s): "AMMONIA" in the last 168 hours.  Coagulation Profile: No results for input(s): "INR", "PROTIME" in the last 168 hours.  Cardiac Enzymes: No results for input(s): "CKTOTAL", "CKMB", "CKMBINDEX", "TROPONINI" in the last 168 hours.  BNP (last 3 results) No results for input(s): "PROBNP" in the last 8760 hours.  Lipid Profile: No results for input(s): "CHOL", "HDL", "LDLCALC", "TRIG", "CHOLHDL", "LDLDIRECT" in the last 72 hours.  Thyroid Function Tests: No results for input(s): "TSH", "T4TOTAL", "FREET4", "T3FREE", "THYROIDAB" in the last 72 hours.  Anemia Panel: No results for input(s): "VITAMINB12", "FOLATE", "FERRITIN", "TIBC", "IRON", "RETICCTPCT" in the last 72 hours.  Urine analysis:    Component Value Date/Time   COLORURINE YELLOW 02/03/2023 2148   APPEARANCEUR  HAZY (A) 02/03/2023 2148   LABSPEC 1.014 02/03/2023 2148   PHURINE 5.0 02/03/2023 2148   GLUCOSEU 150 (A) 02/03/2023 2148   HGBUR NEGATIVE 02/03/2023 2148   BILIRUBINUR NEGATIVE 02/03/2023 2148   KETONESUR NEGATIVE 02/03/2023 2148   PROTEINUR >=300 (A) 02/03/2023 2148   UROBILINOGEN 0.2 09/18/2015 1327   NITRITE NEGATIVE 02/03/2023 2148   LEUKOCYTESUR SMALL (A) 02/03/2023 2148    Sepsis Labs: Lactic Acid, Venous    Component Value Date/Time   LATICACIDVEN 1.0 09/18/2022 1030    MICROBIOLOGY: No results found for this or any previous visit (from  the past 240 hour(s)).  RADIOLOGY STUDIES/RESULTS: No results found.   LOS: 8 days   Oren Binet, MD  [facility]    To contact the attending provider between 7A-7P or the covering provider during after hours 7P-7A, please log into the web site [email or web address] and access using universal [place] password for that web site. If you do not have the password, please call the hospital operator.  02/10/2023, 12:03 PM

## 2023-02-10 NOTE — Progress Notes (Signed)
Nephrology Follow-Up Consult note   Assessment/Recommendations: Tricia Huynh is a/an 67 y.o. female with a past medical history significant for CKD 4, admitted for AKI.       AKI on CKD 4 progressed to ESRD - b/l creat 2.2- 2.9 from late 2023, eGFR 17- 24 ml/min. Creat here was 4.4 here w/ signs c/w ATN. Crt did not improve and discussed w/ outpt MD who agreed on initiating HD and likely progressed to ESRD. TDC placed. VVS consulted for access creation planning for brachiocephalic fistula on Thursday. CLIP underway likely TCU HTN - cont coreg/ norvasc Anemia esrd - Hgb decreased to 7s with low iron. Continue IV iron replacement. ESA adjustments prn Chest pain - prob GERD per pmd.  IDDM - per pmd     Recommendations conveyed to primary service.    [facility] 02/10/2023 9:55 AM  ___________________________________________________________  CC: AKI on CKD 4  Interval History/Subjective: Patient has no complaints today.  Tolerated dialysis yesterday with no issues   Medications:  Current Facility-Administered Medications  Medication Dose Route Frequency Provider Last Rate Last Admin   acetaminophen (TYLENOL) tablet 650 mg  650 mg Oral Q6H PRN Emilee Hero, MD   650 mg at 02/06/23 1755   Or   acetaminophen (TYLENOL) suppository 650 mg  650 mg Rectal Q6H PRN Emilee Hero, MD       amLODipine (NORVASC) tablet 10 mg  10 mg Oral Daily Dorrell, Herbie [place], MD   10 mg at 02/09/23 1345   atorvastatin (LIPITOR) tablet 40 mg  40 mg Oral Daily Dorrell, Herbie [place], MD   40 mg at 02/09/23 1346   carvedilol (COREG) tablet 25 mg  25 mg Oral BID WC Emilee Hero, MD   25 mg at 02/09/23 1718   Chlorhexidine Gluconate Cloth 2 % PADS 6 each  6 each Topical Q0600 Roney Jaffe, MD   6 each at 02/10/23 0552   Darbepoetin Alfa (ARANESP) injection 40 mcg  40 mcg Subcutaneous Q Sat-1800 Roney Jaffe, MD   40 mcg at 02/07/23 1737   ferric gluconate  (FERRLECIT) 250 mg in sodium chloride 0.9 % 250 mL IVPB  250 mg Intravenous Daily Reesa Chew, MD   Stopped at 02/09/23 1157   heparin sodium (porcine) injection 2,000 Units  2,000 Units Intravenous Once Roney Jaffe, MD       hydrALAZINE (APRESOLINE) injection 10 mg  10 mg Intravenous Q4H PRN Howerter, Justin B, DO       hydrALAZINE (APRESOLINE) tablet 50 mg  50 mg Oral Q8H Pokhrel, Laxman, MD   50 mg at 02/10/23 0549   HYDROmorphone (DILAUDID) injection 0.5 mg  0.5 mg Intravenous Q2H PRN Howerter, Justin B, DO   0.5 mg at 02/09/23 1607   insulin aspart (novoLOG) injection 0-24 Units  0-24 Units Subcutaneous TID WC Emilee Hero, MD   4 Units at 02/09/23 1724   levETIRAcetam (KEPPRA) tablet 500 mg  500 mg Oral BID Emilee Hero, MD   500 mg at 02/09/23 2049   naloxone [facility]) injection 0.4 mg  0.4 mg Intravenous PRN Howerter, Justin B, DO       ondansetron (ZOFRAN) injection 4 mg  4 mg Intravenous Q6H PRN Howerter, Justin B, DO   4 mg at 02/09/23 0848   pantoprazole (PROTONIX) EC tablet 40 mg  40 mg Oral Daily Emilee Hero, MD   40 mg at 02/09/23 1346      Review of Systems: 10 systems reviewed and negative except per  interval history/subjective  Physical Exam: Vitals:   02/10/23 0400 02/10/23 0800  BP: 131/67 (!) 149/81  Pulse: 70 76  Resp: 12 14  Temp: 98.2 F (36.8 C) 98.1 F (36.7 C)  SpO2: 97% 97%   No intake/output data recorded.  Intake/Output Summary (Last 24 hours) at 02/10/2023 0955 Last data filed at 02/10/2023 [id number] Gross per 24 hour  Intake 486.74 ml  Output --  Net 486.74 ml   Constitutional: thin, no acute distress ENMT: ears and nose without scars or lesions, MMM CV: normal rate, no edema Respiratory: bilateral chest rise, normal work of breathing Gastrointestinal: soft, non-tender, no palpable masses or hernias Skin: no visible lesions or rashes Psych: alert, judgement/insight appropriate, appropriate mood and affect Access: Right chest  catheter with blood under dressing   Test Results I personally reviewed new and old clinical labs and radiology tests Lab Results  Component Value Date   NA 134 (L) 02/10/2023   K 3.8 02/10/2023   CL 98 02/10/2023   CO2 26 02/10/2023   BUN 13 02/10/2023   CREATININE 3.21 (H) 02/10/2023   GFR 56.09 (L) 12/24/2016   CALCIUM 8.3 (L) 02/10/2023   ALBUMIN 2.7 (L) 02/03/2023   PHOS 4.3 09/20/2022    CBC Recent Labs  Lab 02/08/23 0330 02/09/23 0659 02/10/23 0548  WBC 5.7 5.7 5.5  HGB 8.0* 8.0* 7.5*  HCT 24.6* 24.8* 22.5*  MCV 89.8 90.2 90.0  PLT 177 179 179

## 2023-02-11 DIAGNOSIS — N179 Acute kidney failure, unspecified: Secondary | ICD-10-CM | POA: Diagnosis not present

## 2023-02-11 DIAGNOSIS — E0849 Diabetes mellitus due to underlying condition with other diabetic neurological complication: Secondary | ICD-10-CM | POA: Diagnosis not present

## 2023-02-11 DIAGNOSIS — R569 Unspecified convulsions: Secondary | ICD-10-CM | POA: Diagnosis not present

## 2023-02-11 DIAGNOSIS — I10 Essential (primary) hypertension: Secondary | ICD-10-CM | POA: Diagnosis not present

## 2023-02-11 LAB — CBC
HCT: 23.5 % — ABNORMAL LOW (ref 36.0–46.0)
Hemoglobin: 7.3 g/dL — ABNORMAL LOW (ref 12.0–15.0)
MCH: 29 pg (ref 26.0–34.0)
MCHC: 31.1 g/dL (ref 30.0–36.0)
MCV: 93.3 fL (ref 80.0–100.0)
Platelets: 190 10*3/uL (ref 150–400)
RBC: 2.52 MIL/uL — ABNORMAL LOW (ref 3.87–5.11)
RDW: 14.9 % (ref 11.5–15.5)
WBC: 7.8 10*3/uL (ref 4.0–10.5)
nRBC: 0 % (ref 0.0–0.2)

## 2023-02-11 LAB — RENAL FUNCTION PANEL
Albumin: 2.2 g/dL — ABNORMAL LOW (ref 3.5–5.0)
Anion gap: 12 (ref 5–15)
BUN: 22 mg/dL (ref 8–23)
CO2: 26 mmol/L (ref 22–32)
Calcium: 8.5 mg/dL — ABNORMAL LOW (ref 8.9–10.3)
Chloride: 97 mmol/L — ABNORMAL LOW (ref 98–111)
Creatinine, Ser: 4.4 mg/dL — ABNORMAL HIGH (ref 0.44–1.00)
GFR, Estimated: 10 mL/min — ABNORMAL LOW (ref >60)
Glucose, Bld: 180 mg/dL — ABNORMAL HIGH (ref 70–99)
Phosphorus: 4.4 mg/dL (ref 2.5–4.6)
Potassium: 3.7 mmol/L (ref 3.5–5.1)
Sodium: 135 mmol/L (ref 135–145)

## 2023-02-11 LAB — GLUCOSE, CAPILLARY
Glucose-Capillary: 145 mg/dL — ABNORMAL HIGH (ref 70–99)
Glucose-Capillary: 186 mg/dL — ABNORMAL HIGH (ref 70–99)
Glucose-Capillary: 154 mg/dL — ABNORMAL HIGH (ref 70–99)

## 2023-02-11 MED ORDER — HEPARIN SODIUM (PORCINE) 1000 UNIT/ML IJ SOLN
INTRAMUSCULAR | Status: AC
  Administered 2023-02-11: 3800 [IU]
  Filled 2023-02-11: qty 4

## 2023-02-11 NOTE — Progress Notes (Signed)
PROGRESS NOTE        PATIENT DETAILS Name: Tricia Huynh Age: 67 y.o. Sex: female Date of Birth: 1956-04-08 Admit Date: 02/02/2023 Admitting Physician Emilee Hero, MD [id number], Ivin Booty, MD  Brief Summary: Patient is a 67 y.o.  female with history of CKD 4, DM-2 who presented with nausea/vomiting-she was found to have AKI that unfortunately did not get better with supportive care-she is now thought to have ESRD.  See below for further details.    Significant events: 3/18>> admit to [facility]  Significant studies: 3/18>> CXR: No PNA 3/18>> renal ultrasound: No hydronephrosis  Significant microbiology data: None  Procedures: 3/22>> TDC by IR  Consults: Nephrology IR Vascular surgery  Subjective: Lying comfortably in bed-no major issues overnight.  Objective: Vitals: Blood pressure (!) 149/75, pulse 84, temperature 98.5 F (36.9 C), temperature source Oral, resp. rate 13, height 5\' 8"  (1.727 m), weight 50.5 kg, SpO2 99 %.   Exam: Gen Exam:Alert awake-not in any distress HEENT:atraumatic, normocephalic Chest: B/L clear to auscultation anteriorly CVS:S1S2 regular Abdomen:soft non tender, non distended Extremities:no edema Neurology: Non focal Skin: no rash  Pertinent Labs/Radiology:    Latest Ref Rng & Units 02/11/2023    8:10 AM 02/10/2023    5:48 AM 02/09/2023    6:59 AM  CBC  WBC 4.0 - 10.5 K/uL 7.8  5.5  5.7   Hemoglobin 12.0 - 15.0 g/dL 7.3  7.5  8.0   Hematocrit 36.0 - 46.0 % 23.5  22.5  24.8   Platelets 150 - 400 K/uL 190  179  179     Lab Results  Component Value Date   NA 135 02/11/2023   K 3.7 02/11/2023   CL 97 (L) 02/11/2023   CO2 26 02/11/2023      Assessment/Plan: AKI on CKD 4 with progression to ESRD Nephrology following [facility] placed by IR 3/22 Vascular surgery planning for brachiocephalic fistula creation 3/28.  Normocytic anemia Secondary to CKD Adna/iron therapy defer to nephrology Follow CBC  periodically  GERD PPI  DM-2 (A1c 7.0 on 3/21) CBGs stable with SSI  Recent Labs    02/09/23 2132 02/10/23 1547 02/10/23 2123  GLUCAP 191* 98 184*     HTN BP stable Coreg/amlodipine  HLD Statin  Seizure disorder Keppra  Nutrition Status: Nutrition Problem: Moderate Malnutrition Etiology: chronic illness Signs/Symptoms: moderate muscle depletion, mild fat depletion Interventions: MVI, Liberalize Diet    Underweight: Estimated body mass index is 16.93 kg/m as calculated from the following:   Height as of this encounter: 5\' 8"  (1.727 m).   Weight as of this encounter: 50.5 kg.   Code status:   Code Status: Full Code   DVT Prophylaxis: Place TED hose Start: 02/02/23 1720   Family Communication: None at bedside  Disposition Plan: Status is: Inpatient Remains inpatient appropriate because: Severity of illness   Planned Discharge Destination:Home   Diet: Diet Order             Diet NPO time specified Except for: Sips with Meds  Diet effective midnight           Diet regular Room service appropriate? Yes with Assist; Fluid consistency: Thin; Fluid restriction: 1200 mL Fluid  Diet effective now                     Antimicrobial agents: Anti-infectives (From admission, onward)  Start     Dose/Rate Route Frequency Ordered Stop   02/06/23 0930  ceFAZolin (ANCEF) IVPB 2g/100 mL premix        2 g 200 mL/hr over 30 Minutes Intravenous To Radiology 02/06/23 0835 02/06/23 0920        MEDICATIONS: Scheduled Meds:  amLODipine  10 mg Oral Daily   atorvastatin  40 mg Oral Daily   carvedilol  25 mg Oral BID WC   Chlorhexidine Gluconate Cloth  6 each Topical Q0600   [START ON 02/14/2023] darbepoetin (ARANESP) injection - DIALYSIS  100 mcg Subcutaneous Q Sat-1800   heparin sodium (porcine)  2,000 Units Intravenous Once   hydrALAZINE  50 mg Oral Q8H   insulin aspart  0-24 Units Subcutaneous TID WC   levETIRAcetam  500 mg Oral BID   multivitamin  1  tablet Oral QHS   pantoprazole  40 mg Oral Daily   Continuous Infusions:   PRN Meds:.acetaminophen **OR** acetaminophen, hydrALAZINE, HYDROmorphone (DILAUDID) injection, naLOXone (NARCAN)  injection, ondansetron (ZOFRAN) IV   I have personally reviewed following labs and imaging studies  LABORATORY DATA: CBC: Recent Labs  Lab 02/04/23 1801 02/08/23 0330 02/09/23 0659 02/10/23 0548 02/11/23 0810  WBC  --  5.7 5.7 5.5 7.8  HGB 8.2* 8.0* 8.0* 7.5* 7.3*  HCT 25.3* 24.6* 24.8* 22.5* 23.5*  MCV  --  89.8 90.2 90.0 93.3  PLT  --  177 179 179 190     Basic Metabolic Panel: Recent Labs  Lab 02/07/23 0307 02/08/23 0330 02/09/23 0659 02/10/23 0548 02/11/23 0810  NA 135 136 136 134* 135  K 3.6 3.4* 3.5 3.8 3.7  CL 105 102 104 98 97*  CO2 21* 28 25 26 26   GLUCOSE 178* 159* 137* 148* 180*  BUN 19 14 17 13 22   CREATININE 2.95* 3.01* 3.68* 3.21* 4.40*  CALCIUM 8.2* 7.7* 8.3* 8.3* 8.5*  MG  --  1.5* 1.6*  --   --   PHOS  --   --   --   --  4.4     GFR: Estimated Creatinine Clearance: 10 mL/min (A) (by C-G formula based on SCr of 4.4 mg/dL (H)).  Liver Function Tests: Recent Labs  Lab 02/11/23 0810  ALBUMIN 2.2*   No results for input(s): "LIPASE", "AMYLASE" in the last 168 hours. No results for input(s): "AMMONIA" in the last 168 hours.  Coagulation Profile: No results for input(s): "INR", "PROTIME" in the last 168 hours.  Cardiac Enzymes: No results for input(s): "CKTOTAL", "CKMB", "CKMBINDEX", "TROPONINI" in the last 168 hours.  BNP (last 3 results) No results for input(s): "PROBNP" in the last 8760 hours.  Lipid Profile: No results for input(s): "CHOL", "HDL", "LDLCALC", "TRIG", "CHOLHDL", "LDLDIRECT" in the last 72 hours.  Thyroid Function Tests: No results for input(s): "TSH", "T4TOTAL", "FREET4", "T3FREE", "THYROIDAB" in the last 72 hours.  Anemia Panel: No results for input(s): "VITAMINB12", "FOLATE", "FERRITIN", "TIBC", "IRON", "RETICCTPCT" in the  last 72 hours.  Urine analysis:    Component Value Date/Time   COLORURINE YELLOW 02/03/2023 2148   APPEARANCEUR HAZY (A) 02/03/2023 2148   LABSPEC 1.014 02/03/2023 2148   PHURINE 5.0 02/03/2023 2148   GLUCOSEU 150 (A) 02/03/2023 2148   HGBUR NEGATIVE 02/03/2023 2148   BILIRUBINUR NEGATIVE 02/03/2023 2148   [place] NEGATIVE 02/03/2023 2148   PROTEINUR >=300 (A) 02/03/2023 2148   UROBILINOGEN 0.2 09/18/2015 1327   NITRITE NEGATIVE 02/03/2023 2148   LEUKOCYTESUR SMALL (A) 02/03/2023 2148    Sepsis Labs: Lactic Acid,  Venous    Component Value Date/Time   LATICACIDVEN 1.0 09/18/2022 1030    MICROBIOLOGY: No results found for this or any previous visit (from the past 240 hour(s)).  RADIOLOGY STUDIES/RESULTS: No results found.   LOS: 9 days   Oren Binet, MD  [facility]    To contact the attending provider between 7A-7P or the covering provider during after hours 7P-7A, please log into the web site [email or web address] and access using universal [place] password for that web site. If you do not have the password, please call the hospital operator.  02/11/2023, 12:07 PM

## 2023-02-11 NOTE — Progress Notes (Signed)
Received patient in bed to unit.  Alert and oriented.  Informed consent signed and in chart.   TX duration:3.25  Patient tolerated well.  Transported back to the room  Alert, without acute distress.  Hand-off given to patient's nurse.   Access used: right [facility] Access issues: none  Total UF removed: 2L Medication(s) given: none   02/11/23 1134  Vitals  Temp 98.5 F (36.9 C)  Temp Source Oral  BP 135/79  MAP (mmHg) 95  BP Location Right Arm  BP Method Automatic  Patient Position (if appropriate) Lying  Pulse Rate 84  Pulse Rate Source Monitor  ECG Heart Rate 84  Resp 12  Oxygen Therapy  SpO2 99 %  O2 Device Room Air  During Treatment Monitoring  HD Safety Checks Performed Yes  Intra-Hemodialysis Comments Tx completed;Tolerated well  Dialysis Fluid Bolus Normal Saline  Bolus Amount (mL) 300 mL      [facility]

## 2023-02-11 NOTE — Progress Notes (Signed)
Nephrology Follow-Up Consult note   Assessment/Recommendations: Tricia Huynh is a/an 67 y.o. female with a past medical history significant for CKD 4, admitted for AKI.       AKI on CKD 4 progressed to ESRD - b/l creat 2.2- 2.9 from late 2023, eGFR 17- 24 ml/min. Creat here was 4.4 here w/ signs c/w ATN. Crt did not improve and discussed w/ outpt MD who agreed on initiating HD and likely progressed to ESRD. TDC placed. VVS consulted for access creation planning for brachiocephalic fistula on Thursday.  Accepted to TCU.  Likely start on Saturday HTN - cont coreg/ norvasc Anemia esrd - Hgb decreased to 7s with low iron. Continue IV iron replacement. ESA adjustments prn Chest pain - prob GERD per pmd.  IDDM - per pmd  Dispo: Patient is planned to have surgery for left upper extremity AVF tomorrow.  Patient could potentially go home after the surgery and proceed to the dialysis unit on Friday to complete paperwork.  She would start dialysis outpatient on Saturday.  If she remains in the hospital on Friday would try to discharge early morning so she can make it to the dialysis unit to do paperwork and start on Friday.     Recommendations conveyed to primary service.    [facility] 02/11/2023 10:02 AM  ___________________________________________________________  CC: AKI on CKD 4  Interval History/Subjective: Patient seen on dialysis today with no complaints.  Bleeding from catheter improved.   Medications:  Current Facility-Administered Medications  Medication Dose Route Frequency Provider Last Rate Last Admin   acetaminophen (TYLENOL) tablet 650 mg  650 mg Oral Q6H PRN Emilee Hero, MD   650 mg at 02/06/23 1755   Or   acetaminophen (TYLENOL) suppository 650 mg  650 mg Rectal Q6H PRN Emilee Hero, MD       amLODipine (NORVASC) tablet 10 mg  10 mg Oral Daily Dorrell, Herbie Baltimore, MD   10 mg at 02/10/23 1108   atorvastatin (LIPITOR) tablet 40  mg  40 mg Oral Daily Emilee Hero, MD   40 mg at 02/10/23 1107   carvedilol (COREG) tablet 25 mg  25 mg Oral BID WC Emilee Hero, MD   25 mg at 02/10/23 1807   Chlorhexidine Gluconate Cloth 2 % PADS 6 each  6 each Topical Q0600 Roney Jaffe, MD   6 each at 02/11/23 0530   [START ON 02/14/2023] Darbepoetin Alfa (ARANESP) injection 100 mcg  100 mcg Subcutaneous Q Sat-1800 Reesa Chew, MD       heparin sodium (porcine) 1000 UNIT/ML injection            heparin sodium (porcine) injection 2,000 Units  2,000 Units Intravenous Once Roney Jaffe, MD       hydrALAZINE (APRESOLINE) injection 10 mg  10 mg Intravenous Q4H PRN Howerter, Justin B, DO       hydrALAZINE (APRESOLINE) tablet 50 mg  50 mg Oral Q8H Pokhrel, Laxman, MD   50 mg at 02/11/23 0530   HYDROmorphone (DILAUDID) injection 0.5 mg  0.5 mg Intravenous Q2H PRN Howerter, Justin B, DO   0.5 mg at 02/10/23 1117   insulin aspart (novoLOG) injection 0-24 Units  0-24 Units Subcutaneous TID WC Emilee Hero, MD   8 Units at 02/10/23 1216   levETIRAcetam (KEPPRA) tablet 500 mg  500 mg Oral BID Emilee Hero, MD   500 mg at 02/10/23 2113   multivitamin (RENA-VIT) tablet 1 tablet  1 tablet Oral QHS Ghimire, Shanker M,  MD   1 tablet at 02/10/23 2113   naloxone [facility]) injection 0.4 mg  0.4 mg Intravenous PRN Howerter, Justin B, DO       ondansetron (ZOFRAN) injection 4 mg  4 mg Intravenous Q6H PRN Howerter, Justin B, DO   4 mg at 02/09/23 0848   pantoprazole (PROTONIX) EC tablet 40 mg  40 mg Oral Daily Emilee Hero, MD   40 mg at 02/10/23 1107      Review of Systems: 10 systems reviewed and negative except per interval history/subjective  Physical Exam: Vitals:   02/11/23 0930 02/11/23 1000  BP: (!) 149/80   Pulse: 83 85  Resp: 11 11  Temp:    SpO2: 99% 99%   No intake/output data recorded.  Intake/Output Summary (Last 24 hours) at 02/11/2023 1002 Last data filed at 02/10/2023 1958 Gross per 24 hour  Intake 118.4 ml   Output 500 ml  Net -381.6 ml   Constitutional: thin, no acute distress ENMT: ears and nose without scars or lesions, MMM CV: normal rate, no edema Respiratory: bilateral chest rise, normal work of breathing Gastrointestinal: soft, non-tender, no palpable masses or hernias Skin: no visible lesions or rashes Psych: alert, judgement/insight appropriate, appropriate mood and affect Access: Right chest catheter    Test Results I personally reviewed new and old clinical labs and radiology tests Lab Results  Component Value Date   NA 135 02/11/2023   K 3.7 02/11/2023   CL 97 (L) 02/11/2023   CO2 26 02/11/2023   BUN 22 02/11/2023   CREATININE 4.40 (H) 02/11/2023   GFR 56.09 (L) 12/24/2016   CALCIUM 8.5 (L) 02/11/2023   ALBUMIN 2.2 (L) 02/11/2023   PHOS 4.4 02/11/2023    CBC Recent Labs  Lab 02/09/23 0659 02/10/23 0548 02/11/23 0810  WBC 5.7 5.5 7.8  HGB 8.0* 7.5* 7.3*  HCT 24.8* 22.5* 23.5*  MCV 90.2 90.0 93.3  PLT 179 179 190

## 2023-02-11 NOTE — Progress Notes (Signed)
  Progress Note    02/11/2023 8:49 AM * No surgery date entered *  Subjective:  feeling okay this morning. Chest feels sore at [facility] site but no active bleeding    Vitals:   02/11/23 0812 02/11/23 0830  BP: (!) 148/75 (!) 153/82  Pulse: 79 80  Resp: 19 13  Temp:    SpO2: 100% 99%    Physical Exam: General:  resting comfortably Cardiac:  regular Extremities:  palpable radial and brachial pulses bilaterally  CBC    Component Value Date/Time   WBC 7.8 02/11/2023 0810   RBC 2.52 (L) 02/11/2023 0810   HGB 7.3 (L) 02/11/2023 0810   HCT 23.5 (L) 02/11/2023 0810   PLT 190 02/11/2023 0810   MCV 93.3 02/11/2023 0810   MCH 29.0 02/11/2023 0810   MCHC 31.1 02/11/2023 0810   RDW 14.9 02/11/2023 0810   LYMPHSABS 1.5 02/02/2023 1351   MONOABS 0.5 02/02/2023 1351   EOSABS 0.0 02/02/2023 1351   BASOSABS 0.0 02/02/2023 1351    BMET    Component Value Date/Time   NA 134 (L) 02/10/2023 0548   K 3.8 02/10/2023 0548   CL 98 02/10/2023 0548   CO2 26 02/10/2023 0548   GLUCOSE 148 (H) 02/10/2023 0548   BUN 13 02/10/2023 0548   CREATININE 3.21 (H) 02/10/2023 0548   CALCIUM 8.3 (L) 02/10/2023 0548   GFRNONAA 15 (L) 02/10/2023 0548   GFRAA >60 08/20/2020 0020    INR    Component Value Date/Time   INR 1.2 07/25/2022 0325     Intake/Output Summary (Last 24 hours) at 02/11/2023 0849 Last data filed at 02/10/2023 1958 Gross per 24 hour  Intake 236.4 ml  Output 500 ml  Net -263.6 ml      Assessment/Plan:  67 y.o. female in need of permanent ESRD access  -No bleeding issues yesterday or overnight with TDC -She has easily palpable pulses in the left arm and is still agreeable to LUE fistula creation tomorrow with Dr.Robins -Order has been placed for NPO at midnight  Vicente Serene, PA-C [facility] [phone] 02/11/2023 8:49 AM

## 2023-02-11 NOTE — Procedures (Signed)
I was present at this dialysis session. I have reviewed the session itself and made appropriate changes.   Filed Weights   02/09/23 1237 02/10/23 0500 02/11/23 0744  Weight: 49.1 kg 49 kg 52.4 kg    Recent Labs  Lab 02/11/23 0810  NA 135  K 3.7  CL 97*  CO2 26  GLUCOSE 180*  BUN 22  CREATININE 4.40*  CALCIUM 8.5*  PHOS 4.4    Recent Labs  Lab 02/09/23 0659 02/10/23 0548 02/11/23 0810  WBC 5.7 5.5 7.8  HGB 8.0* 7.5* 7.3*  HCT 24.8* 22.5* 23.5*  MCV 90.2 90.0 93.3  PLT 179 179 190    Scheduled Meds:  amLODipine  10 mg Oral Daily   atorvastatin  40 mg Oral Daily   carvedilol  25 mg Oral BID WC   Chlorhexidine Gluconate Cloth  6 each Topical Q0600   [START ON 02/14/2023] darbepoetin (ARANESP) injection - DIALYSIS  100 mcg Subcutaneous Q Sat-1800   heparin sodium (porcine)       heparin sodium (porcine)  2,000 Units Intravenous Once   hydrALAZINE  50 mg Oral Q8H   insulin aspart  0-24 Units Subcutaneous TID WC   levETIRAcetam  500 mg Oral BID   multivitamin  1 tablet Oral QHS   pantoprazole  40 mg Oral Daily   Continuous Infusions: PRN Meds:.acetaminophen **OR** acetaminophen, heparin sodium (porcine), hydrALAZINE, HYDROmorphone (DILAUDID) injection, naLOXone (NARCAN)  injection, ondansetron (ZOFRAN) IV   Santiago Bumpers,  MD 02/11/2023, 10:02 AM

## 2023-02-12 ENCOUNTER — Other Ambulatory Visit: Payer: Self-pay

## 2023-02-12 ENCOUNTER — Emergency Department (HOSPITAL_COMMUNITY): Payer: 59

## 2023-02-12 ENCOUNTER — Inpatient Hospital Stay (HOSPITAL_COMMUNITY): Payer: 59 | Admitting: Anesthesiology

## 2023-02-12 ENCOUNTER — Encounter (HOSPITAL_COMMUNITY)
Admission: EM | Disposition: A | Discharge status: Home / Self Care | Payer: Self-pay | Source: Home / Self Care | Attending: Internal Medicine

## 2023-02-12 ENCOUNTER — Encounter (HOSPITAL_COMMUNITY): Payer: Self-pay

## 2023-02-12 ENCOUNTER — Emergency Department (HOSPITAL_COMMUNITY)
Admission: EM | Admit: 2023-02-12 | Discharge: 2023-02-13 | Disposition: A | Discharge status: Home / Self Care | Payer: 59 | Attending: Emergency Medicine | Admitting: Emergency Medicine

## 2023-02-12 DIAGNOSIS — R2689 Other abnormalities of gait and mobility: Secondary | ICD-10-CM | POA: Insufficient documentation

## 2023-02-12 DIAGNOSIS — N186 End stage renal disease: Secondary | ICD-10-CM | POA: Insufficient documentation

## 2023-02-12 DIAGNOSIS — Z992 Dependence on renal dialysis: Secondary | ICD-10-CM

## 2023-02-12 DIAGNOSIS — R531 Weakness: Secondary | ICD-10-CM | POA: Diagnosis not present

## 2023-02-12 DIAGNOSIS — I132 Hypertensive heart and chronic kidney disease with heart failure and with stage 5 chronic kidney disease, or end stage renal disease: Secondary | ICD-10-CM

## 2023-02-12 DIAGNOSIS — Z20822 Contact with and (suspected) exposure to covid-19: Secondary | ICD-10-CM | POA: Insufficient documentation

## 2023-02-12 DIAGNOSIS — R1013 Epigastric pain: Secondary | ICD-10-CM | POA: Diagnosis not present

## 2023-02-12 DIAGNOSIS — R5383 Other fatigue: Secondary | ICD-10-CM

## 2023-02-12 DIAGNOSIS — N179 Acute kidney failure, unspecified: Secondary | ICD-10-CM | POA: Diagnosis not present

## 2023-02-12 DIAGNOSIS — R519 Headache, unspecified: Secondary | ICD-10-CM | POA: Insufficient documentation

## 2023-02-12 DIAGNOSIS — N185 Chronic kidney disease, stage 5: Secondary | ICD-10-CM

## 2023-02-12 DIAGNOSIS — E1122 Type 2 diabetes mellitus with diabetic chronic kidney disease: Secondary | ICD-10-CM

## 2023-02-12 DIAGNOSIS — D631 Anemia in chronic kidney disease: Secondary | ICD-10-CM

## 2023-02-12 DIAGNOSIS — R262 Difficulty in walking, not elsewhere classified: Secondary | ICD-10-CM

## 2023-02-12 DIAGNOSIS — R0602 Shortness of breath: Secondary | ICD-10-CM | POA: Diagnosis not present

## 2023-02-12 DIAGNOSIS — E1042 Type 1 diabetes mellitus with diabetic polyneuropathy: Secondary | ICD-10-CM

## 2023-02-12 DIAGNOSIS — R5381 Other malaise: Secondary | ICD-10-CM | POA: Diagnosis not present

## 2023-02-12 DIAGNOSIS — R569 Unspecified convulsions: Secondary | ICD-10-CM | POA: Diagnosis not present

## 2023-02-12 DIAGNOSIS — I10 Essential (primary) hypertension: Secondary | ICD-10-CM | POA: Diagnosis not present

## 2023-02-12 DIAGNOSIS — Z87891 Personal history of nicotine dependence: Secondary | ICD-10-CM

## 2023-02-12 DIAGNOSIS — I509 Heart failure, unspecified: Secondary | ICD-10-CM

## 2023-02-12 HISTORY — PX: AV FISTULA PLACEMENT: SHX1204

## 2023-02-12 LAB — CBC WITH DIFFERENTIAL/PLATELET
Abs Immature Granulocytes: 0.02 10*3/uL (ref 0.00–0.07)
Basophils Absolute: 0 10*3/uL (ref 0.0–0.1)
Basophils Relative: 0 %
Eosinophils Absolute: 0 10*3/uL (ref 0.0–0.5)
Eosinophils Relative: 0 %
HCT: 26.6 % — ABNORMAL LOW (ref 36.0–46.0)
Hemoglobin: 8.5 g/dL — ABNORMAL LOW (ref 12.0–15.0)
Immature Granulocytes: 0 %
Lymphocytes Relative: 21 %
Lymphs Abs: 1.6 10*3/uL (ref 0.7–4.0)
MCH: 29.6 pg (ref 26.0–34.0)
MCHC: 32 g/dL (ref 30.0–36.0)
MCV: 92.7 fL (ref 80.0–100.0)
Monocytes Absolute: 0.6 10*3/uL (ref 0.1–1.0)
Monocytes Relative: 8 %
Neutro Abs: 5.4 10*3/uL (ref 1.7–7.7)
Neutrophils Relative %: 71 %
Platelets: 259 10*3/uL (ref 150–400)
RBC: 2.87 MIL/uL — ABNORMAL LOW (ref 3.87–5.11)
RDW: 15.1 % (ref 11.5–15.5)
WBC: 7.6 10*3/uL (ref 4.0–10.5)
nRBC: 0 % (ref 0.0–0.2)

## 2023-02-12 LAB — BASIC METABOLIC PANEL
Anion gap: 13 (ref 5–15)
BUN: 22 mg/dL (ref 8–23)
CO2: 25 mmol/L (ref 22–32)
Calcium: 8.8 mg/dL — ABNORMAL LOW (ref 8.9–10.3)
Chloride: 92 mmol/L — ABNORMAL LOW (ref 98–111)
Creatinine, Ser: 4.58 mg/dL — ABNORMAL HIGH (ref 0.44–1.00)
GFR, Estimated: 10 mL/min — ABNORMAL LOW (ref >60)
Glucose, Bld: 180 mg/dL — ABNORMAL HIGH (ref 70–99)
Potassium: 4 mmol/L (ref 3.5–5.1)
Sodium: 130 mmol/L — ABNORMAL LOW (ref 135–145)

## 2023-02-12 LAB — TROPONIN I (HIGH SENSITIVITY): Troponin I (High Sensitivity): 19 ng/L — ABNORMAL HIGH (ref <18)

## 2023-02-12 LAB — RESP PANEL BY RT-PCR (RSV, FLU A&B, COVID)  RVPGX2
Influenza A by PCR: NEGATIVE
Influenza B by PCR: NEGATIVE
Resp Syncytial Virus by PCR: NEGATIVE
SARS Coronavirus 2 by RT PCR: NEGATIVE

## 2023-02-12 LAB — CBG MONITORING, ED: Glucose-Capillary: 179 mg/dL — ABNORMAL HIGH (ref 70–99)

## 2023-02-12 LAB — GLUCOSE, CAPILLARY
Glucose-Capillary: 168 mg/dL — ABNORMAL HIGH (ref 70–99)
Glucose-Capillary: 203 mg/dL — ABNORMAL HIGH (ref 70–99)
Glucose-Capillary: 161 mg/dL — ABNORMAL HIGH (ref 70–99)

## 2023-02-12 LAB — BRAIN NATRIURETIC PEPTIDE: B Natriuretic Peptide: 132.5 pg/mL — ABNORMAL HIGH (ref 0.0–100.0)

## 2023-02-12 SURGERY — ARTERIOVENOUS (AV) FISTULA CREATION
Anesthesia: Monitor Anesthesia Care | Site: Arm Upper | Laterality: Left

## 2023-02-12 MED ORDER — ACETAMINOPHEN 500 MG PO TABS
1000.0000 mg | ORAL_TABLET | ORAL | Status: AC
  Administered 2023-02-13: 1000 mg via ORAL
  Filled 2023-02-12: qty 2

## 2023-02-12 MED ORDER — HYDROMORPHONE HCL 1 MG/ML IJ SOLN: 0.2500 mg | INTRAMUSCULAR | Status: DC | PRN

## 2023-02-12 MED ORDER — LIDOCAINE HCL (PF) 1 % IJ SOLN
INTRAMUSCULAR | Status: AC
  Filled 2023-02-12: qty 30

## 2023-02-12 MED ORDER — FENTANYL CITRATE (PF) 250 MCG/5ML IJ SOLN
INTRAMUSCULAR | Status: AC
  Filled 2023-02-12: qty 5

## 2023-02-12 MED ORDER — PROMETHAZINE HCL 25 MG/ML IJ SOLN: 6.2500 mg | INTRAMUSCULAR | Status: DC | PRN

## 2023-02-12 MED ORDER — PHENYLEPHRINE HCL-NACL 20-0.9 MG/250ML-% IV SOLN
INTRAVENOUS | Status: DC | PRN
  Administered 2023-02-12: 25 ug/min via INTRAVENOUS

## 2023-02-12 MED ORDER — HEPARIN 6000 UNIT IRRIGATION SOLUTION
Status: AC
  Filled 2023-02-12: qty 500

## 2023-02-12 MED ORDER — PROPOFOL 500 MG/50ML IV EMUL
INTRAVENOUS | Status: DC | PRN
  Administered 2023-02-12: 100 ug/kg/min via INTRAVENOUS

## 2023-02-12 MED ORDER — PAPAVERINE HCL 30 MG/ML IJ SOLN
INTRAMUSCULAR | Status: AC
  Filled 2023-02-12: qty 2

## 2023-02-12 MED ORDER — ROPIVACAINE HCL 5 MG/ML IJ SOLN
INTRAMUSCULAR | Status: DC | PRN
  Administered 2023-02-12: 30 mL via EPIDURAL
  Administered 2023-02-12: 10 mL via EPIDURAL

## 2023-02-12 MED ORDER — FENTANYL CITRATE (PF) 250 MCG/5ML IJ SOLN
INTRAMUSCULAR | Status: DC | PRN
  Administered 2023-02-12: 50 ug via INTRAVENOUS

## 2023-02-12 MED ORDER — MIDAZOLAM HCL 2 MG/2ML IJ SOLN
INTRAMUSCULAR | Status: AC
  Filled 2023-02-12: qty 2

## 2023-02-12 MED ORDER — HEPARIN SODIUM (PORCINE) 1000 UNIT/ML IJ SOLN
INTRAMUSCULAR | Status: DC | PRN
  Administered 2023-02-12: 2000 [IU] via INTRAVENOUS

## 2023-02-12 MED ORDER — CEFAZOLIN SODIUM-DEXTROSE 1-4 GM/50ML-% IV SOLN
INTRAVENOUS | Status: DC | PRN
  Administered 2023-02-12: 1 g via INTRAVENOUS

## 2023-02-12 MED ORDER — SODIUM CHLORIDE 0.9 % IV SOLN: INTRAVENOUS | Status: DC | PRN

## 2023-02-12 MED ORDER — LANTUS SOLOSTAR 100 UNIT/ML ~~LOC~~ SOPN: 5.0000 [IU] | PEN_INJECTOR | Freq: Every day | SUBCUTANEOUS | 11 refills | Status: AC

## 2023-02-12 MED ORDER — CEFAZOLIN SODIUM-DEXTROSE 1-4 GM/50ML-% IV SOLN
INTRAVENOUS | Status: AC
  Filled 2023-02-12: qty 50

## 2023-02-12 MED ORDER — HEPARIN SODIUM (PORCINE) 1000 UNIT/ML IJ SOLN
INTRAMUSCULAR | Status: AC
  Filled 2023-02-12: qty 10

## 2023-02-12 MED ORDER — HEPARIN 6000 UNIT IRRIGATION SOLUTION
Status: DC | PRN
  Administered 2023-02-12: 1

## 2023-02-12 MED ORDER — PROTAMINE SULFATE 10 MG/ML IV SOLN
INTRAVENOUS | Status: AC
  Filled 2023-02-12: qty 5

## 2023-02-12 MED ORDER — 0.9 % SODIUM CHLORIDE (POUR BTL) OPTIME
TOPICAL | Status: DC | PRN
  Administered 2023-02-12: 1000 mL

## 2023-02-12 MED ORDER — MIDAZOLAM HCL 2 MG/2ML IJ SOLN
INTRAMUSCULAR | Status: DC | PRN
  Administered 2023-02-12: 1 mg via INTRAVENOUS

## 2023-02-12 SURGICAL SUPPLY — 35 items
ADH SKN CLS APL DERMABOND .7 (GAUZE/BANDAGES/DRESSINGS) ×1
ARMBAND PINK RESTRICT EXTREMIT (MISCELLANEOUS) ×1 IMPLANT
BAG COUNTER SPONGE SURGICOUNT (BAG) ×1 IMPLANT
BAG SPNG CNTER NS LX DISP (BAG) ×1
BLADE CLIPPER SURG (BLADE) ×1 IMPLANT
BNDG ELASTIC 4X5.8 VLCR STR LF (GAUZE/BANDAGES/DRESSINGS) ×1 IMPLANT
CANISTER SUCT 3000ML PPV (MISCELLANEOUS) ×1 IMPLANT
CLIP TI MEDIUM 6 (CLIP) ×1 IMPLANT
COVER PROBE W GEL 5X96 (DRAPES) ×1 IMPLANT
DERMABOND ADVANCED .7 DNX12 (GAUZE/BANDAGES/DRESSINGS) ×1 IMPLANT
ELECT REM PT RETURN 9FT ADLT (ELECTROSURGICAL) ×1
ELECTRODE REM PT RTRN 9FT ADLT (ELECTROSURGICAL) ×1 IMPLANT
GLOVE BIO SURGEON STRL SZ 6.5 (GLOVE) IMPLANT
GLOVE BIOGEL PI IND STRL 8 (GLOVE) ×1 IMPLANT
GOWN STRL REUS W/ TWL LRG LVL3 (GOWN DISPOSABLE) ×2 IMPLANT
GOWN STRL REUS W/TWL 2XL LVL3 (GOWN DISPOSABLE) ×2 IMPLANT
GOWN STRL REUS W/TWL LRG LVL3 (GOWN DISPOSABLE) ×2
KIT BASIN OR (CUSTOM PROCEDURE TRAY) ×1 IMPLANT
KIT TURNOVER KIT B (KITS) ×1 IMPLANT
NS IRRIG 1000ML POUR BTL (IV SOLUTION) ×1 IMPLANT
PACK CV ACCESS (CUSTOM PROCEDURE TRAY) ×1 IMPLANT
PAD ARMBOARD 7.5X6 YLW CONV (MISCELLANEOUS) ×2 IMPLANT
SLING ARM FOAM STRAP LRG (SOFTGOODS) IMPLANT
SLING ARM FOAM STRAP MED (SOFTGOODS) IMPLANT
SPIKE FLUID TRANSFER (MISCELLANEOUS) ×1 IMPLANT
SUT MNCRL AB 4-0 PS2 18 (SUTURE) ×1 IMPLANT
SUT PROLENE 6 0 BV (SUTURE) ×1 IMPLANT
SUT PROLENE 7 0 BV 1 (SUTURE) IMPLANT
SUT SILK 2 0 SH (SUTURE) IMPLANT
SUT SILK 3 0 SH CR/8 (SUTURE) ×1 IMPLANT
SUT VIC AB 3-0 SH 27 (SUTURE) ×1
SUT VIC AB 3-0 SH 27X BRD (SUTURE) ×1 IMPLANT
TOWEL GREEN STERILE (TOWEL DISPOSABLE) ×1 IMPLANT
UNDERPAD 30X36 HEAVY ABSORB (UNDERPADS AND DIAPERS) ×1 IMPLANT
WATER STERILE IRR 1000ML POUR (IV SOLUTION) ×1 IMPLANT

## 2023-02-12 NOTE — Progress Notes (Signed)
  Progress Note    02/12/2023 7:31 AM Day of Surgery  Subjective:  no complaints    Vitals:   02/12/23 0343 02/12/23 0400  BP: 133/78 135/80  Pulse: 76 78  Resp: 14 13  Temp: 98.3 F (36.8 C)   SpO2: 97% 97%    Physical Exam: General:  resting comfortably Cardiac:  regular Extremities:  palpable radial and brachial pulses bilaterally  CBC    Component Value Date/Time   WBC 7.8 02/11/2023 0810   RBC 2.52 (L) 02/11/2023 0810   HGB 7.3 (L) 02/11/2023 0810   HCT 23.5 (L) 02/11/2023 0810   PLT 190 02/11/2023 0810   MCV 93.3 02/11/2023 0810   MCH 29.0 02/11/2023 0810   MCHC 31.1 02/11/2023 0810   RDW 14.9 02/11/2023 0810   LYMPHSABS 1.5 02/02/2023 1351   MONOABS 0.5 02/02/2023 1351   EOSABS 0.0 02/02/2023 1351   BASOSABS 0.0 02/02/2023 1351    BMET    Component Value Date/Time   NA 135 02/11/2023 0810   K 3.7 02/11/2023 0810   CL 97 (L) 02/11/2023 0810   CO2 26 02/11/2023 0810   GLUCOSE 180 (H) 02/11/2023 0810   BUN 22 02/11/2023 0810   CREATININE 4.40 (H) 02/11/2023 0810   CALCIUM 8.5 (L) 02/11/2023 0810   GFRNONAA 10 (L) 02/11/2023 0810   GFRAA >60 08/20/2020 0020    INR    Component Value Date/Time   INR 1.2 07/25/2022 0325     Intake/Output Summary (Last 24 hours) at 02/12/2023 0731 Last data filed at 02/11/2023 1700 Gross per 24 hour  Intake 240 ml  Output 2000 ml  Net -1760 ml       Assessment/Plan:  67 y.o. female in need of permanent ESRD access  Pt scheduled for left arm fistula creation. After discussing hte risks and benefits, Tricia Huynh elected to proceed.   Broadus John MD [facility] [phone] 02/12/2023 7:31 AM

## 2023-02-12 NOTE — Progress Notes (Signed)
Report given to Jaquelyn Bitter, CRNA. Pt accompanied to OR by transport staff.

## 2023-02-12 NOTE — Transfer of Care (Signed)
Immediate Anesthesia Transfer of Care Note  Patient: Tricia Huynh  Procedure(s) Performed: LEFT ARM BRACHIOCEPHALIC ARTERIOVENOUS (AV) FISTULA CREATION (Left: Arm Upper)  Patient Location: PACU  Anesthesia Type:MAC combined with regional for post-op pain  Level of Consciousness: awake, alert , and oriented  Airway & Oxygen Therapy: Patient Spontanous Breathing  Post-op Assessment: Report given to RN, Post -op Vital signs reviewed and stable, Patient moving all extremities, and Patient able to stick tongue midline  Post vital signs: Reviewed  Last Vitals:  Vitals Value Taken Time  BP 150/71 02/12/23 0853  Temp 97.8   Pulse 76 02/12/23 0854  Resp 19 02/12/23 0854  SpO2 95 % 02/12/23 0854  Vitals shown include unvalidated device data.  Last Pain:  Vitals:   02/12/23 0343  TempSrc: Oral  PainSc:       Patients Stated Pain Goal: 0 ([phone] [id number])  Complications: No notable events documented.

## 2023-02-12 NOTE — Anesthesia Postprocedure Evaluation (Signed)
Anesthesia Post Note  Patient: Tarena Jeske  Procedure(s) Performed: LEFT ARM BRACHIOCEPHALIC ARTERIOVENOUS (AV) FISTULA CREATION (Left: Arm Upper)     Patient location during evaluation: PACU Anesthesia Type: Regional Level of consciousness: awake and alert Pain management: pain level controlled Vital Signs Assessment: post-procedure vital signs reviewed and stable Respiratory status: spontaneous breathing, nonlabored ventilation and respiratory function stable Cardiovascular status: blood pressure returned to baseline and stable Postop Assessment: no apparent nausea or vomiting Anesthetic complications: no   No notable events documented.  Last Vitals:  Vitals:   02/12/23 0930 02/12/23 1000  BP: (!) 164/72 (!) 173/78  Pulse: 75 75  Resp: 13 12  Temp:    SpO2: 95% 96%    Last Pain:  Vitals:   02/12/23 0930  TempSrc:   PainSc: 0-No pain                 Lynda Rainwater

## 2023-02-12 NOTE — Anesthesia Procedure Notes (Addendum)
Anesthesia Regional Block: Supraclavicular block   Pre-Anesthetic Checklist: , timeout performed,  Correct Patient, Correct Site, Correct Laterality,  Correct Procedure, Correct Position, site marked,  Risks and benefits discussed,  Surgical consent,  Pre-op evaluation,  At surgeon's request and post-op pain management  Laterality: Left  Prep: chloraprep       Needles:  Injection technique: Single-shot  Needle Type: Stimiplex     Needle Length: 9cm  Needle Gauge: 21     Additional Needles:   Procedures:,,,, ultrasound used (permanent image in chart),,    Narrative:  Start time: 02/12/2023 7:22 AM End time: 02/12/2023 7:27 AM Injection made incrementally with aspirations every 5 mL.  Performed by: Personally  Anesthesiologist: Lynda Rainwater, MD  Additional Notes: Intercostobrachial block performed

## 2023-02-12 NOTE — ED Provider Notes (Signed)
[facility] Provider Note   CSN: [id number] Arrival date & time: 02/12/23  2058     History  Chief Complaint  Patient presents with   Weakness   Shortness of Breath    Tricia Huynh is a 67 y.o. female.  67 year old female with ESRD on Monday Wednesday Friday dialysis who presents with multiple complaints.  Patient was recently hospitalized and discharged today after having her AV fistula placed.  She went home to her daughter's house who saw her for the first time and was concerned because she appeared weak and was complaining of epigastric pain, shortness of breath, and a mild headache.  No fevers recently and denies any chest pains.  Was hospitalized for severe AKI that eventually required dialysis.  Daughter says that she is in the process of getting home health arranged and is feeling overwhelmed with caring for her mother.       Home Medications Prior to Admission medications   Medication Sig Start Date End Date Taking? Authorizing Provider  albuterol (VENTOLIN HFA) 108 (90 Base) MCG/ACT inhaler Inhale 1-2 puffs into the lungs every 6 (six) hours as needed for wheezing or shortness of breath. Patient not taking: Reported on 02/02/2023 10/26/22   Varney Biles, MD  amLODipine (NORVASC) 10 MG tablet Take 1 tablet (10 mg total) by mouth daily. 09/21/22 10/21/22  Antonieta Pert, MD  amLODipine (NORVASC) 10 MG tablet Take 10 mg by mouth daily.    [provider]  atorvastatin (LIPITOR) 40 MG tablet Take 40 mg by mouth daily. 12/23/19   [provider]  carvedilol (COREG) 25 MG tablet Take 1 tablet (25 mg total) by mouth 2 (two) times daily with a meal. 09/21/22 10/21/22  Antonieta Pert, MD  Continuous Blood Gluc Sensor (FREESTYLE LIBRE 3 SENSOR) MISC 1 each by Does not apply route 2 (two) times a week. Place 1 sensor on the skin every 14 days. Use to check glucose continuously 09/22/22   Antonieta Pert, MD  ferrous sulfate 325  (65 FE) MG tablet Take 1 tablet (325 mg total) by mouth daily with breakfast. Patient not taking: Reported on 02/02/2023 05/02/22   Raiford Noble Latif, DO  gabapentin (NEURONTIN) 100 MG capsule Take 1 capsule (100 mg total) by mouth at bedtime. 07/28/22   Danford, Suann Larry, MD  hydrALAZINE (APRESOLINE) 50 MG tablet Take 50 mg by mouth 3 (three) times daily.    [provider]  insulin glargine (LANTUS SOLOSTAR) 100 UNIT/ML Solostar Pen Inject 5 Units into the skin daily. Discard pen after 28 days from first use 02/12/23   Ghimire, Henreitta Leber, MD  Insulin Pen Needle (TECHLITE PEN NEEDLES) 32G X 4 MM MISC Use to inject lantus once a day 07/28/22   Danford, Suann Larry, MD  levETIRAcetam (KEPPRA) 500 MG tablet Take 1 tablet (500 mg total) by mouth 2 (two) times daily. 09/21/22 02/02/23  Antonieta Pert, MD  Multiple Vitamin (MULTIVITAMIN WITH MINERALS) TABS tablet Take 1 tablet by mouth daily. Patient not taking: Reported on 02/02/2023 05/02/22   Raiford Noble Latif, DO  Vitamin D, Ergocalciferol, (DRISDOL) 1.25 MG (50000 UNIT) CAPS capsule Take 50,000 Units by mouth every 7 (seven) days.    [provider]      Allergies    Percocet [oxycodone-acetaminophen] and Vicodin [hydrocodone-acetaminophen]    Review of Systems   Review of Systems  Physical Exam Updated Vital Signs BP (!) 142/76   Pulse 88   Temp 97.9 F (36.6  C)   Resp 10   Wt 57.6 kg   SpO2 98%   BMI 19.31 kg/m  Physical Exam Vitals and nursing note reviewed.  Constitutional:      General: She is not in acute distress.    Appearance: She is well-developed.     Comments: Chronically ill-appearing. patient requiring assistance to ambulate.  HENT:     Head: Normocephalic and atraumatic.     Right Ear: External ear normal.     Left Ear: External ear normal.     Nose: Nose normal.  Eyes:     Extraocular Movements: Extraocular movements intact.     Conjunctiva/sclera: Conjunctivae normal.     Pupils: Pupils are  equal, round, and reactive to light.  Cardiovascular:     Rate and Rhythm: Normal rate and regular rhythm.     Heart sounds: No murmur heard. Pulmonary:     Effort: Pulmonary effort is normal. No respiratory distress.     Breath sounds: Normal breath sounds.  Abdominal:     General: Abdomen is flat. There is no distension.     Palpations: Abdomen is soft. There is no mass.     Tenderness: There is no abdominal tenderness. There is no guarding.  Musculoskeletal:     Cervical back: Normal range of motion and neck supple.     Right lower leg: No edema.     Left lower leg: No edema.     Comments: Fistula in left upper extremity with bruit and thrill  Skin:    General: Skin is warm and dry.  Neurological:     Mental Status: She is alert and oriented to person, place, and time. Mental status is at baseline.  Psychiatric:        Mood and Affect: Mood normal.     ED Results / Procedures / Treatments   Labs (all labs ordered are listed, but only abnormal results are displayed) Labs Reviewed  CBC WITH DIFFERENTIAL/PLATELET - Abnormal; Notable for the following components:      Result Value   RBC 2.87 (*)    Hemoglobin 8.5 (*)    HCT 26.6 (*)    All other components within normal limits  BASIC METABOLIC PANEL - Abnormal; Notable for the following components:   Sodium 130 (*)    Chloride 92 (*)    Glucose, Bld 180 (*)    Creatinine, Ser 4.58 (*)    Calcium 8.8 (*)    GFR, Estimated 10 (*)    All other components within normal limits  BRAIN NATRIURETIC PEPTIDE - Abnormal; Notable for the following components:   B Natriuretic Peptide 132.5 (*)    All other components within normal limits  CBG MONITORING, ED - Abnormal; Notable for the following components:   Glucose-Capillary 179 (*)    All other components within normal limits  TROPONIN I (HIGH SENSITIVITY) - Abnormal; Notable for the following components:   Troponin I (High Sensitivity) 19 (*)    All other components within  normal limits  RESP PANEL BY RT-PCR (RSV, FLU A&B, COVID)  RVPGX2  TROPONIN I (HIGH SENSITIVITY)    EKG EKG Interpretation  Date/Time:  Thursday February 12 2023 21:09:02 EDT Ventricular Rate:  96 PR Interval:  146 QRS Duration: 78 QT Interval:  370 QTC Calculation: 467 R Axis:   -8 Text Interpretation: Normal sinus rhythm Cannot rule out Anterior infarct , age undetermined Abnormal ECG When compared with ECG of 02-Feb-2023 13:28, PREVIOUS ECG IS PRESENT Confirmed by Philip Aspen,  Herbie [place] [phone]) on 02/12/2023 10:16:07 PM  Radiology DG Chest Portable 1 View  Result Date: 02/12/2023 CLINICAL DATA:  Shortness of breath.  Status post fistula formation. EXAM: PORTABLE CHEST 1 VIEW COMPARISON:  02/02/2023. FINDINGS: Heart is mildly enlarged and the mediastinal contour is stable. There is atherosclerotic calcification of the aorta. No consolidation, effusion, or pneumothorax. A right internal jugular central venous catheter terminates at the cavoatrial junction. No acute osseous abnormality. IMPRESSION: 1. No active disease. 2. Stable mild cardiomegaly. Electronically Signed   By: Brett Fairy M.D.   On: 02/12/2023 22:29    Procedures Procedures   Medications Ordered in ED Medications  acetaminophen (TYLENOL) tablet 1,000 mg (1,000 mg Oral Given 02/13/23 0004)    ED Course/ Medical Decision Making/ A&P                             Medical Decision Making Amount and/or Complexity of Data Reviewed Labs: ordered. Radiology: ordered.  Risk OTC drugs.   Enda To is a 67 y.o. female with comorbidities that complicate the patient evaluation including ESRD on Monday Wednesday Friday dialysis who presents with generalized weakness, epigastric pain, shortness of breath and mild headache after being discharged today   Initial Ddx:  URI, pneumonia, MI, anemia, ESRD  MDM:  Patient was just discharged from the hospital today.  Unclear was causing her symptoms at this time but suspect  that some of these may be more subacute in nature and related to her new onset renal failure.  Will check a COVID and flu as well as a chest x-ray to ensure that she did not contract infection.  Will check a EKG and troponin with her risk factors to ensure that she is not having MI.  Could potentially be related to operative blood losses with the creation of her fistula so we will check a CBC as well.  Plan:  Labs Troponin COVID and flu EKG Chest x-ray  ED Summary/Re-evaluation:  Patient underwent the above workup that was unremarkable.  Did attempt to ambulate the patient and she was unable to do so without assistance.  Patient reports that she would potentially be interested in rehab if she qualifies.  Did discuss this with the patient's daughter who agrees and appears to be overwhelmed with taking care of her mother at home given her multiple medical conditions.  Will have physical therapy see the patient in the morning and place her in [facility] boarder status.   This patient presents to the ED for concern of complaints listed in HPI, this involves an extensive number of treatment options, and is a complaint that carries with it a high risk of complications and morbidity. Disposition including potential need for admission considered.   Dispo: TOC boarder  Additional history obtained from daughter Records reviewed Outpatient Clinic Notes The following labs were independently interpreted: CBC and show chronic anemia I independently reviewed the following imaging with scope of interpretation limited to determining acute life threatening conditions related to emergency care: Chest x-ray and agree with the radiologist interpretation with the following exceptions: none I personally reviewed and interpreted cardiac monitoring: normal sinus rhythm  I personally reviewed and interpreted the pt's EKG: see above for interpretation  I have reviewed the patients home medications and made adjustments as  needed Consults: TOC and PT  Final Clinical Impression(s) / ED Diagnoses Final diagnoses:  Shortness of breath  Other fatigue  Ambulatory dysfunction    Rx /  DC Orders ED Discharge Orders     None         Fransico Meadow, MD 02/13/23 [phone]

## 2023-02-12 NOTE — TOC Transition Note (Signed)
Transition of Care [facility]) - CM/SW Discharge Note   Patient Details  Name: Tricia Huynh MRN: [id number] Date of Birth: Nov 26, 1955  Transition of Care [facility]) CM/SW Contact:  Levonne Lapping, RN Phone Number: 02/12/2023, 10:59 AM   Clinical Narrative:     Patient to dc to home today  Will begin HD Friday  No TOC needs           Patient Goals and CMS Choice      Discharge Placement                         Discharge Plan and Services Additional resources added to the After Visit Summary for                                       Social Determinants of Health (SDOH) Interventions SDOH Screenings   Food Insecurity: No Food Insecurity (02/02/2023)  Housing: [place]  (02/02/2023)  Transportation Needs: No Transportation Needs (02/02/2023)  Utilities: Not At Risk (02/02/2023)  Tobacco Use: Medium Risk (02/06/2023)     Readmission Risk Interventions    07/28/2022   10:31 AM  Readmission Risk Prevention Plan  Transportation Screening Complete  PCP or Specialist Appt within 3-5 Days Complete  HRI or [place] Complete  Social Work Consult for [place] Planning/Counseling Complete  Palliative Care Screening Not Applicable  Medication Review [employer]) Complete

## 2023-02-12 NOTE — Progress Notes (Signed)
Met with pt at bedside to review pt's out-pt HD schedule/arrangements. Pt will need to start tomorrow at Transitional Care Unit at [facility]. Pt will need to arrive at 7:00 am to complete paperwork prior to 7:30 chair time. Pt advised that treatment tomorrow will likely last 3 hrs per Jolayne Haines, [place] RN. Bobbie at [facility] aware pt to d/c today and should start tomorrow. Out-pt HD arrangements added to pt's AVS as well. Renal PA aware of clinic's need for orders for tomorrow. Pt states that granddaughter will likely transport pt to HD appt tomorrow. Pt has schedule letter with all details noted.   Melven Sartorius Renal Navigator [phone]

## 2023-02-12 NOTE — Op Note (Signed)
    NAME: Tricia Huynh    MRN: [id number] DOB: July 08, 1956    DATE OF OPERATION: 02/12/2023  PREOP DIAGNOSIS:    End stage renal disease  POSTOP DIAGNOSIS:    Same  PROCEDURE:    Left arm brachiocephalic fistula   SURGEON: Broadus John  ASSIST: Luisa Dago PA  ANESTHESIA: Block, moderate   EBL: 57ml  INDICATIONS:    Tricia Huynh is a 67 y.o. female with end stage renal disease in need of long term HD access  FINDINGS:    78mm brachial artery 79mm cephalic vein at the proximal forearm  TECHNIQUE:   The patient was brought to the operating room and placed in supine position. The left arm was prepped and draped in standard fashion. IV antibiotics were administered. A timeout was performed.   The case began with ultrasound insonation of the brachial artery and cephalic vein, which demonstrated sufficient size at the antecubital fossa for arteriovenous fistula.   A transverse incision was made below the elbow creese in the antecubital fossa. The  cephalic vein was isolated for 3 cm in length. Next the aponeurosis was partially released and the brachial artery secured with a vessel loop. The patient was heparinized. The cephalic vein was transected and ligated distally with a 2-0 silk stick-tie. The vein was dilated with coronary dilators and flushed with heparin saline. Vascular clamps were placed proximally and distally on the brachial artery and a 5 mm arteriotomy was created on the brachial artery. This was flushed with heparin saline.  An anastomosis was created in end to side fashion on the brachial artery using running 6-0 Prolene suture.  Prior to completing the anastomosis, the vessels were flushed and the suture line was tied down. There was an excellent thrill in the cephalic vein from the anastomosis to the mid upper bicipital region. The outflow was so good the fistula was dynamic, with mild decompression with the cardiac cycle. The patient had a  palpable radial pulse. The incision was irrigated and hemostasis achieved with cautery and suture. The deeper tissue was closed with 3-0 Vicryl and the skin closed with 4-0 Monocryl.  Dermabond was applied to the incisions. The patient was transferred to PACU in stable condition.  Given the complexity of the case,  the assistant was necessary in order to expedient the procedure and safely perform the technical aspects of the operation.  The assistant provided traction and countertraction to assist with exposure of the artery and vein.  They also assisted with suture ligation of multiple venous branches.  They played a critical role in the anastomosis. These skills, especially following the Prolene suture for the anastomosis, could not have been adequately performed by a scrub tech assistant.    Macie Burows, MD [facility] DATE OF DICTATION:   02/12/2023

## 2023-02-12 NOTE — Anesthesia Procedure Notes (Signed)
Procedure Name: MAC Date/Time: 02/12/2023 7:41 AM  Performed by: Maude Leriche, CRNAPre-anesthesia Checklist: Patient identified, Emergency Drugs available, Suction available, Patient being monitored and Timeout performed Oxygen Delivery Method: Nasal cannula Placement Confirmation: positive ETCO2 Dental Injury: Teeth and Oropharynx as per pre-operative assessment

## 2023-02-12 NOTE — ED Triage Notes (Signed)
Pt arrives [facility] from home. Pt had a left arm brachiocephalic AV fistula created today. Approx 2-3 hours ago, pt endorses feeling generally weak, having abdominal pain, and shortness of breath as well as a headache.  New dialysis patient. Had full dialysis yesterday.

## 2023-02-12 NOTE — ED Provider Triage Note (Signed)
Emergency Medicine Provider Triage Evaluation Note  Tricia Huynh , a 67 y.o. female  was evaluated in triage.  Pt complains of Fatigue and difficulty breathing.  Review of Systems  Positive: Sob, fatigue Negative: Chest pain, abdominal pain  Physical Exam  BP (!) 145/74 (BP Location: Right Arm)   Pulse 96   Temp 99.1 F (37.3 C) (Oral)   Resp 18   SpO2 98%  Gen:   Awake, no distress   Resp:  Normal effort  MSK:   Moves extremities without difficulty  Other:  Left AC without any obvious erythema no bruising  Medical Decision Making  Medically screening exam initiated at 9:09 PM.  Appropriate orders placed.  Tricia Huynh was informed that the remainder of the evaluation will be completed by another provider, this initial triage assessment does not replace that evaluation, and the importance of remaining in the ED until their evaluation is complete.  67 yo F with a AV fistula on the left created today.  Patient tells me that she felt unwell when she got home.  Has trouble describing it further.  Plan to obtain blood work.     Tricia Etienne, DO 02/12/23 2110

## 2023-02-12 NOTE — Anesthesia Preprocedure Evaluation (Addendum)
Anesthesia Evaluation  Patient identified by MRN, date of birth, ID band Patient awake    Reviewed: Allergy & Precautions, H&P , NPO status , Patient's Chart, lab work & pertinent test results  Airway Mallampati: II  TM Distance: >3 FB Neck ROM: Full    Dental  (+) Edentulous Upper, Edentulous Lower   Pulmonary former smoker   Pulmonary exam normal breath sounds clear to auscultation       Cardiovascular hypertension, Pt. on medications +CHF  Normal cardiovascular exam Rhythm:Regular Rate:Normal     Neuro/Psych Seizures -,   negative psych ROS   GI/Hepatic negative GI ROS, Neg liver ROS,,,  Endo/Other  diabetes, Poorly Controlled, Type 2    Renal/GU ESRFRenal disease  negative genitourinary   Musculoskeletal negative musculoskeletal ROS (+)    Abdominal   Peds negative pediatric ROS (+)  Hematology  (+) Blood dyscrasia, anemia   Anesthesia Other Findings   Reproductive/Obstetrics negative OB ROS                             Anesthesia Physical Anesthesia Plan  ASA: 4  Anesthesia Plan: MAC and Regional   Post-op Pain Management: Minimal or no pain anticipated   Induction: Intravenous  PONV Risk Score and Plan: 2 and Ondansetron, Midazolam and Treatment may vary due to age or medical condition  Airway Management Planned: Simple Face Mask  Additional Equipment:   Intra-op Plan:   Post-operative Plan:   Informed Consent: I have reviewed the patients History and Physical, chart, labs and discussed the procedure including the risks, benefits and alternatives for the proposed anesthesia with the patient or authorized representative who has indicated his/her understanding and acceptance.     Dental advisory given  Plan Discussed with: CRNA  Anesthesia Plan Comments:        Anesthesia Quick Evaluation

## 2023-02-12 NOTE — Discharge Summary (Signed)
PATIENT DETAILS Name: Tricia Huynh Age: 67 y.o. Sex: female Date of Birth: Jan 13, 1956 MRN: [id number]. Admitting Physician: Emilee Hero, MD [id number], Ivin Booty, MD  Admit Date: 02/02/2023 Discharge date: 02/12/2023  Recommendations for Outpatient Follow-up:  Follow up with PCP in 1-2 weeks Please obtain CMP/CBC in one week Please ensure follow-up with nephrology, vascular surgery   Admitted From:  Home  Disposition: Home   Discharge Condition: good  CODE STATUS:   Code Status: Full Code   Diet recommendation:  Diet Order             Diet Heart Room service appropriate? Yes; Fluid consistency: Thin  Diet effective now           Diet - low sodium heart healthy           Diet Carb Modified                    Brief Summary: Patient is a 67 y.o.  female with history of CKD 4, DM-2 who presented with nausea/vomiting-she was found to have AKI that unfortunately did not get better with supportive care-she is now thought to have ESRD.  See below for further details.     Significant events: 3/18>> admit to [facility]   Significant studies: 3/18>> CXR: No PNA 3/18>> renal ultrasound: No hydronephrosis   Significant microbiology data: None   Procedures: 3/22>> TDC by IR 3/28>> creation of left arm brachiocephalic fistula   Consults: Nephrology IR Vascular surgery  Brief Hospital Course: AKI on CKD 4 with progression to ESRD Nephrology followed closely-started on HD this admit [facility] placed by IR 3/22 Vascular surgery created AV fistula 3/28 Spoke with nephrologist-Dr. Peoples-and vascular surgeon-Dr. Georgie Chard to discharge from the point of view. Outpatient HD arrangements made by renal navigator.  Normocytic anemia Secondary to CKD Adna/iron therapy defer to nephrology Follow CBC periodically   GERD PPI   DM-2 (A1c 7.0 on 3/21) Continue low-dose Lantus on discharge. Follow with PCP for further optimization  HTN BP  stable Coreg/amlodipine/hydralazine   HLD Statin   Seizure disorder Keppra   Nutrition Status: Nutrition Problem: Moderate Malnutrition Etiology: chronic illness Signs/Symptoms: moderate muscle depletion, mild fat depletion Interventions: MVI, Liberalize Diet    Underweight: Estimated body mass index is 16.93 kg/m as calculated from the following:   Height as of this encounter: 5\' 8"  (1.727 m).   Weight as of this encounter: 50.5 kg.    Discharge Diagnoses:  Principal Problem:   AKI (acute kidney injury) ([place]) Active Problems:   Benign essential HTN   Diabetic neuropathy ([place])   Seizures ([place])   Discharge Instructions:  Activity:  As tolerated with Full fall precautions use walker/cane & assistance as needed WBAT   Discharge Instructions     Call MD for:  difficulty breathing, headache or visual disturbances   Complete by: As directed    Call MD for:  persistant nausea and vomiting   Complete by: As directed    Diet - low sodium heart healthy   Complete by: As directed    Diet Carb Modified   Complete by: As directed    Discharge instructions   Complete by: As directed    Follow with Primary MD  Jonathon Jordan, MD in 1-2 weeks  Follow with vascular surgeon as instructed  Follow-up with nephrology as instructed.  Follow-up with your dialysis center as instructed.  Please get a complete blood count and chemistry panel checked by your Primary MD at your next  visit, and again as instructed by your Primary MD.  Get Medicines reviewed and adjusted: Please take all your medications with you for your next visit with your Primary MD  Laboratory/radiological data: Please request your Primary MD to go over all hospital tests and procedure/radiological results at the follow up, please ask your Primary MD to get all Hospital records sent to his/her office.  In some cases, they will be blood work, cultures and biopsy results pending at the time of your discharge.  Please request that your primary care M.D. follows up on these results.  Also Note the following: If you experience worsening of your admission symptoms, develop shortness of breath, life threatening emergency, suicidal or homicidal thoughts you must seek medical attention immediately by calling 911 or calling your MD immediately  if symptoms less severe.  You must read complete instructions/literature along with all the possible adverse reactions/side effects for all the Medicines you take and that have been prescribed to you. Take any new Medicines after you have completely understood and accpet all the possible adverse reactions/side effects.   Do not drive when taking Pain medications or sleeping medications (Benzodaizepines)  Do not take more than prescribed Pain, Sleep and Anxiety Medications. It is not advisable to combine anxiety,sleep and pain medications without talking with your primary care practitioner  Special Instructions: If you have smoked or chewed Tobacco  in the last 2 yrs please stop smoking, stop any regular Alcohol  and or any Recreational drug use.  Wear Seat belts while driving.  Please note: You were cared for by a hospitalist during your hospital stay. Once you are discharged, your primary care physician will handle any further medical issues. Please note that NO REFILLS for any discharge medications will be authorized once you are discharged, as it is imperative that you return to your primary care physician (or establish a relationship with a primary care physician if you do not have one) for your post hospital discharge needs so that they can reassess your need for medications and monitor your lab values.   Increase activity slowly   Complete by: As directed    No wound care   Complete by: As directed       Allergies as of 02/12/2023       Reactions   Percocet [oxycodone-acetaminophen] Nausea And Vomiting, Other (See Comments)   Causes the patient to be  shaky/unsteady, also   Vicodin [hydrocodone-acetaminophen] Nausea And Vomiting, Other (See Comments)   Causes the patient to be shaky/unsteady, also        Medication List     STOP taking these medications    sodium bicarbonate 650 MG tablet       TAKE these medications    albuterol 108 (90 Base) MCG/ACT inhaler Commonly known as: VENTOLIN HFA Inhale 1-2 puffs into the lungs every 6 (six) hours as needed for wheezing or shortness of breath.   amLODipine 10 MG tablet Commonly known as: NORVASC Take 10 mg by mouth daily.   amLODipine 10 MG tablet Commonly known as: NORVASC Take 1 tablet (10 mg total) by mouth daily.   atorvastatin 40 MG tablet Commonly known as: LIPITOR Take 40 mg by mouth daily.   carvedilol 25 MG tablet Commonly known as: COREG Take 1 tablet (25 mg total) by mouth 2 (two) times daily with a meal. What changed: Another medication with the same name was removed. Continue taking this medication, and follow the directions you see here.   ferrous sulfate  325 (65 FE) MG tablet Take 1 tablet (325 mg total) by mouth daily with breakfast.   FreeStyle Libre 3 Sensor Misc 1 each by Does not apply route 2 (two) times a week. Place 1 sensor on the skin every 14 days. Use to check glucose continuously   gabapentin 100 MG capsule Commonly known as: NEURONTIN Take 1 capsule (100 mg total) by mouth at bedtime.   hydrALAZINE 50 MG tablet Commonly known as: APRESOLINE Take 50 mg by mouth 3 (three) times daily. What changed: Another medication with the same name was removed. Continue taking this medication, and follow the directions you see here.   Lantus SoloStar 100 UNIT/ML Solostar Pen Generic drug: insulin glargine Inject 5 Units into the skin daily. Discard pen after 28 days from first use What changed: how much to take   levETIRAcetam 500 MG tablet Commonly known as: KEPPRA Take 1 tablet (500 mg total) by mouth 2 (two) times daily.   multivitamin  with minerals Tabs tablet Take 1 tablet by mouth daily.   TechLite Pen Needles 32G X 4 MM Misc Generic drug: Insulin Pen Needle Use to inject lantus once a day   Vitamin D (Ergocalciferol) 1.25 MG (50000 UNIT) Caps capsule Commonly known as: DRISDOL Take 50,000 Units by mouth every 7 (seven) days.        [facility]. Go on 02/13/2023.   Why: Transitional Care Unit- schedule is Monday,Tuesday,Thursday, Friday with 7:30 am chair time.  On Friday, please arrive at 7:00 am to complete paperwork prior to treatment. Contact information: [place] [address] [phone]         Jonathon Jordan, MD. Schedule an appointment as soon as possible for a visit in 1 week(s).   Specialty: Family Medicine Contact information: [address] [place]  [address] [phone]                Allergies  Allergen Reactions   Percocet [Oxycodone-Acetaminophen] Nausea And Vomiting and Other (See Comments)    Causes the patient to be shaky/unsteady, also   Vicodin [Hydrocodone-Acetaminophen] Nausea And Vomiting and Other (See Comments)    Causes the patient to be shaky/unsteady, also     Other Procedures/Studies: VAS [place] UPPER EXT VEIN MAPPING (PRE-OP AVF)  Result Date: 02/09/2023 [place] Patient Name:  KIMORE BLASH  Date of Exam:   02/07/2023 Medical Rec #: [id number]               Accession #:    [id number] Date of Birth: 01-22-1956               Patient Gender: F Patient Age:   55 years Exam Location:  [facility] Procedure:      VAS [place] UPPER EXT VEIN MAPPING (PRE-OP AVF) Referring Phys: Dagoberto Ligas --------------------------------------------------------------------------------  Indications: Pre-access. History: ESRD.  Limitations: IV/bandages right arm. Body habitus (BMI 17.43) Comparison Study: No prior study Performing Technologist: Sharion Dove RVS  Examination  Guidelines: A complete evaluation includes B-mode imaging, spectral Doppler, color Doppler, and power Doppler as needed of all accessible portions of each vessel. Bilateral testing is considered an integral part of a complete examination. Limited examinations for reoccurring indications may be performed as noted. +-----------------+-------------+----------+--------------+ Right Cephalic   Diameter (cm)Depth (cm)   Findings    +-----------------+-------------+----------+--------------+ Prox upper arm       0.30        0.21                  +-----------------+-------------+----------+--------------+  Mid upper arm        0.32        0.31                  +-----------------+-------------+----------+--------------+ Dist upper arm       0.46        0.37      thrombus    +-----------------+-------------+----------+--------------+ Antecubital fossa    0.61        0.22      thrombus    +-----------------+-------------+----------+--------------+ Prox forearm                            not visualized +-----------------+-------------+----------+--------------+ Mid forearm                             not visualized +-----------------+-------------+----------+--------------+ Dist forearm                            not visualized +-----------------+-------------+----------+--------------+ Wrist                                   not visualized +-----------------+-------------+----------+--------------+ +-----------------+-------------+----------+--------+ Right Basilic    Diameter (cm)Depth (cm)Findings +-----------------+-------------+----------+--------+ Prox upper arm       0.39        0.89            +-----------------+-------------+----------+--------+ Mid upper arm        0.42        0.89            +-----------------+-------------+----------+--------+ Dist upper arm       0.24        0.42            +-----------------+-------------+----------+--------+  Antecubital fossa    0.18        0.20            +-----------------+-------------+----------+--------+ Prox forearm         0.22        0.17            +-----------------+-------------+----------+--------+ Mid forearm          0.16        0.17            +-----------------+-------------+----------+--------+ Wrist                0.18        0.17            +-----------------+-------------+----------+--------+ +-----------------+-------------+----------+-----------------------+ Left Cephalic    Diameter (cm)Depth (cm)       Findings         +-----------------+-------------+----------+-----------------------+ Prox upper arm       0.36        0.49                           +-----------------+-------------+----------+-----------------------+ Mid upper arm        0.30        0.31                           +-----------------+-------------+----------+-----------------------+ Dist upper arm       0.33        0.20                           +-----------------+-------------+----------+-----------------------+  Antecubital fossa    0.46        0.24   branching just below AC +-----------------+-------------+----------+-----------------------+ Prox forearm         0.17        0.20                           +-----------------+-------------+----------+-----------------------+ Mid forearm          0.19        0.14                           +-----------------+-------------+----------+-----------------------+ Wrist                0.19        0.20                           +-----------------+-------------+----------+-----------------------+ +-----------------+-------------+----------+--------+ Left Basilic     Diameter (cm)Depth (cm)Findings +-----------------+-------------+----------+--------+ Prox upper arm       0.39        0.57            +-----------------+-------------+----------+--------+ Mid upper arm        0.38        0.54             +-----------------+-------------+----------+--------+ Antecubital fossa    0.30        0.20            +-----------------+-------------+----------+--------+ Prox forearm         0.34        0.16            +-----------------+-------------+----------+--------+ Mid forearm          0.31        0.16            +-----------------+-------------+----------+--------+ Wrist                0.15        0.19            +-----------------+-------------+----------+--------+ *See table(s) above for measurements and observations.  Diagnosing physician: Orlie Pollen Electronically signed by Orlie Pollen on 02/09/2023 at 3:35:26 AM.    Final    IR Fluoro Guide CV Line Right  Result Date: 02/06/2023 INDICATION: 67 year old female in need of hemodialysis. She presents for placement of a tunneled hemodialysis catheter. EXAM: TUNNELED CENTRAL VENOUS HEMODIALYSIS CATHETER PLACEMENT WITH ULTRASOUND AND FLUOROSCOPIC GUIDANCE MEDICATIONS: 2 g Ancef. The antibiotic was given in an appropriate time interval prior to skin puncture. ANESTHESIA/SEDATION: Moderate (conscious) sedation was employed during this procedure. A total of Versed 1 mg and Fentanyl 50 mcg was administered intravenously. Moderate Sedation Time: 13 minutes. The patient's level of consciousness and vital signs were monitored continuously by radiology nursing throughout the procedure under my direct supervision. FLUOROSCOPY TIME:  Radiation exposure index: 1 mGy reference air kerma COMPLICATIONS: None immediate. PROCEDURE: Informed written consent was obtained from the patient after a discussion of the risks, benefits, and alternatives to treatment. Questions regarding the procedure were encouraged and answered. The right neck and chest were prepped with chlorhexidine in a sterile fashion, and a sterile drape was applied covering the operative field. Maximum barrier sterile technique with sterile gowns and gloves were used for the procedure. A timeout  was performed prior to the initiation of the procedure. After creating a small venotomy incision, a micropuncture kit was utilized to  access the right internal jugular vein under direct, real-time ultrasound guidance after the overlying soft tissues were anesthetized with 1% lidocaine with epinephrine. Ultrasound image documentation was performed. The microwire was kinked to measure appropriate catheter length. A stiff Glidewire was advanced to the level of the IVC and the micropuncture sheath was exchanged for a peel-away sheath. A palindrome tunneled hemodialysis catheter measuring 23 cm from tip to cuff was tunneled in a retrograde fashion from the anterior chest wall to the venotomy incision. The catheter was then placed through the peel-away sheath with tips ultimately positioned within the superior aspect of the right atrium. Final catheter positioning was confirmed and documented with a spot radiographic image. The catheter aspirates and flushes normally. The catheter was flushed with appropriate volume heparin dwells and then capped. The catheter exit site was secured with a 0-Prolene retention suture. The venotomy incision was closed with Dermabond. Dressings were applied. The patient tolerated the procedure well without immediate post procedural complication. IMPRESSION: Successful placement of 23 cm tip to cuff tunneled hemodialysis catheter via the right internal jugular vein with tips terminating within the superior aspect of the right atrium. The catheter is ready for immediate use. Electronically Signed   By: Jacqulynn Cadet M.D.   On: 02/06/2023 10:05   IR US Guide Vasc Access Right  Result Date: 02/06/2023 INDICATION: 67 year old female in need of hemodialysis. She presents for placement of a tunneled hemodialysis catheter. EXAM: TUNNELED CENTRAL VENOUS HEMODIALYSIS CATHETER PLACEMENT WITH ULTRASOUND AND FLUOROSCOPIC GUIDANCE MEDICATIONS: 2 g Ancef. The antibiotic was given in an appropriate  time interval prior to skin puncture. ANESTHESIA/SEDATION: Moderate (conscious) sedation was employed during this procedure. A total of Versed 1 mg and Fentanyl 50 mcg was administered intravenously. Moderate Sedation Time: 13 minutes. The patient's level of consciousness and vital signs were monitored continuously by radiology nursing throughout the procedure under my direct supervision. FLUOROSCOPY TIME:  Radiation exposure index: 1 mGy reference air kerma COMPLICATIONS: None immediate. PROCEDURE: Informed written consent was obtained from the patient after a discussion of the risks, benefits, and alternatives to treatment. Questions regarding the procedure were encouraged and answered. The right neck and chest were prepped with chlorhexidine in a sterile fashion, and a sterile drape was applied covering the operative field. Maximum barrier sterile technique with sterile gowns and gloves were used for the procedure. A timeout was performed prior to the initiation of the procedure. After creating a small venotomy incision, a micropuncture kit was utilized to access the right internal jugular vein under direct, real-time ultrasound guidance after the overlying soft tissues were anesthetized with 1% lidocaine with epinephrine. Ultrasound image documentation was performed. The microwire was kinked to measure appropriate catheter length. A stiff Glidewire was advanced to the level of the IVC and the micropuncture sheath was exchanged for a peel-away sheath. A palindrome tunneled hemodialysis catheter measuring 23 cm from tip to cuff was tunneled in a retrograde fashion from the anterior chest wall to the venotomy incision. The catheter was then placed through the peel-away sheath with tips ultimately positioned within the superior aspect of the right atrium. Final catheter positioning was confirmed and documented with a spot radiographic image. The catheter aspirates and flushes normally. The catheter was flushed with  appropriate volume heparin dwells and then capped. The catheter exit site was secured with a 0-Prolene retention suture. The venotomy incision was closed with Dermabond. Dressings were applied. The patient tolerated the procedure well without immediate post procedural complication. IMPRESSION: Successful placement of 23  cm tip to cuff tunneled hemodialysis catheter via the right internal jugular vein with tips terminating within the superior aspect of the right atrium. The catheter is ready for immediate use. Electronically Signed   By: Jacqulynn Cadet M.D.   On: 02/06/2023 10:05   US RENAL  Result Date: 02/03/2023 CLINICAL DATA:  Acute renal failure superimposed on stage IV chronic renal disease. EXAM: RENAL / URINARY TRACT ULTRASOUND COMPLETE COMPARISON:  None Available. FINDINGS: Right Kidney: Renal measurements: 9.2 cm x 3.5 cm x 5.7 cm = volume: 95.0 mL. Diffusely increased echogenicity of the renal parenchyma is noted. A 0.9 cm x 0.8 cm x 0.8 cm simple cyst is seen within the upper pole of the right kidney. A 1.4 cm x 1.4 cm x 1.4 cm simple cyst is also seen within the lower pole of the right kidney. No hydronephrosis is visualized. Left Kidney: Renal measurements: 9.3 cm x 5.5 cm x 4.6 cm = volume: 125.1 mL. Diffusely increased echogenicity of the renal parenchyma is noted. A 1.1 cm x 0.9 cm x 0.9 cm simple cyst is seen in the upper pole of the left kidney. A 1.5 cm x 1.4 cm x 1.4 cm simple cyst is noted within the lower pole of the left kidney. No hydronephrosis is visualized. Bladder: Appears normal for degree of bladder distention. Other: None. IMPRESSION: 1. Echogenic renal parenchyma, likely secondary to medical renal disease. 2. Bilateral simple renal cysts. Electronically Signed   By: Virgina Norfolk M.D.   On: 02/03/2023 18:46   DG Chest 2 View  Result Date: 02/02/2023 CLINICAL DATA:  Chest pain, shortness of breath. EXAM: CHEST - 2 VIEW COMPARISON:  10/26/2022. FINDINGS: Clear lungs.  Stable mild cardiomegaly and mediastinal contours. No pleural effusion or pneumothorax. Visualized bones and upper abdomen are unremarkable. IMPRESSION: No evidence of acute cardiopulmonary disease. Electronically Signed   By: Emmit Alexanders M.D.   On: 02/02/2023 14:01     TODAY-DAY OF DISCHARGE:  Subjective:   Tricia Huynh today has no headache,no chest abdominal pain,no new weakness tingling or numbness, feels much better wants to go home today.   Objective:   Blood pressure (!) 163/73, pulse 75, temperature 97.8 F (36.6 C), resp. rate 16, height 5\' 8"  (1.727 m), weight 52.7 kg, SpO2 97 %.  Intake/Output Summary (Last 24 hours) at 02/12/2023 0936 Last data filed at 02/12/2023 0855 Gross per 24 hour  Intake 290 ml  Output 2002 ml  Net -1712 ml   Filed Weights   02/11/23 0744 02/11/23 1137 02/12/23 0500  Weight: 52.4 kg 50.5 kg 52.7 kg    Exam: Awake Alert, Oriented *3, No new F.N deficits, Normal affect B and E.AT,PERRAL Supple Neck,No JVD, No cervical lymphadenopathy appriciated.  Symmetrical Chest wall movement, Good air movement bilaterally, CTAB RRR,No Gallops,Rubs or new Murmurs, No Parasternal Heave +ve B.Sounds, Abd Soft, Non tender, No organomegaly appriciated, No rebound -guarding or rigidity. No Cyanosis, Clubbing or edema, No new Rash or bruise   PERTINENT RADIOLOGIC STUDIES: No results found.   PERTINENT LAB RESULTS: CBC: Recent Labs    02/10/23 0548 02/11/23 0810  WBC 5.5 7.8  HGB 7.5* 7.3*  HCT 22.5* 23.5*  PLT 179 190   CMET CMP     Component Value Date/Time   NA 135 02/11/2023 0810   K 3.7 02/11/2023 0810   CL 97 (L) 02/11/2023 0810   CO2 26 02/11/2023 0810   GLUCOSE 180 (H) 02/11/2023 0810   BUN 22 02/11/2023 0810   CREATININE 4.40 (  H) 02/11/2023 0810   CALCIUM 8.5 (L) 02/11/2023 0810   PROT 6.2 (L) 02/03/2023 0415   ALBUMIN 2.2 (L) 02/11/2023 0810   AST 16 02/03/2023 0415   ALT 12 02/03/2023 0415   ALKPHOS 88 02/03/2023 0415    BILITOT 0.5 02/03/2023 0415   GFRNONAA 10 (L) 02/11/2023 0810   GFRAA >60 08/20/2020 0020    GFR Estimated Creatinine Clearance: 10.5 mL/min (A) (by C-G formula based on SCr of 4.4 mg/dL (H)). No results for input(s): "LIPASE", "AMYLASE" in the last 72 hours. No results for input(s): "CKTOTAL", "CKMB", "CKMBINDEX", "TROPONINI" in the last 72 hours. Invalid input(s): "POCBNP" No results for input(s): "DDIMER" in the last 72 hours. No results for input(s): "HGBA1C" in the last 72 hours. No results for input(s): "CHOL", "HDL", "LDLCALC", "TRIG", "CHOLHDL", "LDLDIRECT" in the last 72 hours. No results for input(s): "TSH", "T4TOTAL", "T3FREE", "THYROIDAB" in the last 72 hours.  Invalid input(s): "FREET3" No results for input(s): "VITAMINB12", "FOLATE", "FERRITIN", "TIBC", "IRON", "RETICCTPCT" in the last 72 hours. Coags: No results for input(s): "INR" in the last 72 hours.  Invalid input(s): "PT" Microbiology: No results found for this or any previous visit (from the past 240 hour(s)).  FURTHER DISCHARGE INSTRUCTIONS:  Get Medicines reviewed and adjusted: Please take all your medications with you for your next visit with your Primary MD  Laboratory/radiological data: Please request your Primary MD to go over all hospital tests and procedure/radiological results at the follow up, please ask your Primary MD to get all Hospital records sent to his/her office.  In some cases, they will be blood work, cultures and biopsy results pending at the time of your discharge. Please request that your primary care M.D. goes through all the records of your hospital data and follows up on these results.  Also Note the following: If you experience worsening of your admission symptoms, develop shortness of breath, life threatening emergency, suicidal or homicidal thoughts you must seek medical attention immediately by calling 911 or calling your MD immediately  if symptoms less severe.  You must read  complete instructions/literature along with all the possible adverse reactions/side effects for all the Medicines you take and that have been prescribed to you. Take any new Medicines after you have completely understood and accpet all the possible adverse reactions/side effects.   Do not drive when taking Pain medications or sleeping medications (Benzodaizepines)  Do not take more than prescribed Pain, Sleep and Anxiety Medications. It is not advisable to combine anxiety,sleep and pain medications without talking with your primary care practitioner  Special Instructions: If you have smoked or chewed Tobacco  in the last 2 yrs please stop smoking, stop any regular Alcohol  and or any Recreational drug use.  Wear Seat belts while driving.  Please note: You were cared for by a hospitalist during your hospital stay. Once you are discharged, your primary care physician will handle any further medical issues. Please note that NO REFILLS for any discharge medications will be authorized once you are discharged, as it is imperative that you return to your primary care physician (or establish a relationship with a primary care physician if you do not have one) for your post hospital discharge needs so that they can reassess your need for medications and monitor your lab values.  Total Time spent coordinating discharge including counseling, education and face to face time equals greater than 30 minutes.  SignedOren Binet 02/12/2023 9:36 AM

## 2023-02-12 NOTE — Progress Notes (Signed)
Nephrology Follow-Up Consult note   Assessment/Recommendations: Tricia Huynh is a/an 67 y.o. female with a past medical history significant for CKD 4, admitted for AKI.       AKI on CKD 4 progressed to ESRD - b/l creat 2.2- 2.9 from late 2023, eGFR 17- 24 ml/min. Creat here was 4.4 here w/ signs c/w ATN. Crt did not improve and discussed w/ outpt MD who agreed on initiating HD and likely progressed to ESRD. TDC placed. VVS w/ bc avf today; appreciate help.  Accepted to TCU.  Likely start on Friday HTN - cont coreg/ norvasc Anemia esrd - Hgb decreased to 7s with low iron. Continue IV iron replacement. ESA adjustments prn Chest pain - prob GERD per pmd.  IDDM - per pmd  Dispo: Patient okay to DC today from my perspective. Okay to start HD outpatient on Friday     Recommendations conveyed to primary service.    [facility] 02/12/2023 10:04 AM  ___________________________________________________________  CC: AKI on CKD 4  Interval History/Subjective: Tolerated left arm bc avf creation this morning. Patient somewhat confused post op but no complaints   Medications:  Current Facility-Administered Medications  Medication Dose Route Frequency Provider Last Rate Last Admin   acetaminophen (TYLENOL) tablet 650 mg  650 mg Oral Q6H PRN Schuh, McKenzi P, PA-C   650 mg at 02/06/23 1755   Or   acetaminophen (TYLENOL) suppository 650 mg  650 mg Rectal Q6H PRN Schuh, McKenzi P, PA-C       amLODipine (NORVASC) tablet 10 mg  10 mg Oral Daily Schuh, McKenzi P, PA-C   10 mg at 02/12/23 0935   atorvastatin (LIPITOR) tablet 40 mg  40 mg Oral Daily Schuh, McKenzi P, PA-C   40 mg at 02/12/23 0934   carvedilol (COREG) tablet 25 mg  25 mg Oral BID WC Schuh, McKenzi P, PA-C   25 mg at 02/11/23 1716   Chlorhexidine Gluconate Cloth 2 % PADS 6 each  6 each Topical Q0600 Schuh, McKenzi P, PA-C   6 each at 02/12/23 0550   [START ON 02/14/2023] Darbepoetin Alfa  (ARANESP) injection 100 mcg  100 mcg Subcutaneous Q Sat-1800 Schuh, McKenzi P, PA-C       hydrALAZINE (APRESOLINE) injection 10 mg  10 mg Intravenous Q4H PRN Schuh, McKenzi P, PA-C       hydrALAZINE (APRESOLINE) tablet 50 mg  50 mg Oral Q8H Schuh, McKenzi P, PA-C   50 mg at 02/12/23 0509   HYDROmorphone (DILAUDID) injection 0.5 mg  0.5 mg Intravenous Q2H PRN Horton Chin, McKenzi P, PA-C   0.5 mg at 02/11/23 1227   insulin aspart (novoLOG) injection 0-24 Units  0-24 Units Subcutaneous TID WC Schuh, McKenzi P, PA-C   4 Units at 02/11/23 1716   levETIRAcetam (KEPPRA) tablet 500 mg  500 mg Oral BID Vicente Serene P, PA-C   500 mg at 02/12/23 0934   multivitamin (RENA-VIT) tablet 1 tablet  1 tablet Oral QHS Schuh, McKenzi P, PA-C   1 tablet at 02/11/23 2052   naloxone (NARCAN) injection 0.4 mg  0.4 mg Intravenous PRN Schuh, McKenzi P, PA-C       ondansetron (ZOFRAN) injection 4 mg  4 mg Intravenous Q6H PRN Schuh, McKenzi P, PA-C   4 mg at 02/11/23 2056   pantoprazole (PROTONIX) EC tablet 40 mg  40 mg Oral Daily Schuh, McKenzi P, PA-C   40 mg at 02/12/23 [id number]      Review of Systems: 10  systems reviewed and negative except per interval history/subjective  Physical Exam: Vitals:   02/12/23 0929 02/12/23 0930  BP: (!) 163/73 (!) 164/72  Pulse: 75 75  Resp: 16 13  Temp:    SpO2: 97% 95%   Total I/O In: 50 [I.V.:50] Out: 2 [Blood:2]  Intake/Output Summary (Last 24 hours) at 02/12/2023 1004 Last data filed at 02/12/2023 [id number] Gross per 24 hour  Intake 290 ml  Output 2002 ml  Net -1712 ml   Constitutional: thin, no acute distress ENMT: ears and nose without scars or lesions, MMM CV: normal rate, no edema Respiratory: bilateral chest rise, normal work of breathing Gastrointestinal: soft, non-tender, no palpable masses or hernias Skin: no visible lesions or rashes Psych: appropriate mood and affect Access: Right chest catheter    Test Results I personally reviewed new and old clinical labs and  radiology tests Lab Results  Component Value Date   NA 135 02/11/2023   K 3.7 02/11/2023   CL 97 (L) 02/11/2023   CO2 26 02/11/2023   BUN 22 02/11/2023   CREATININE 4.40 (H) 02/11/2023   GFR 56.09 (L) 12/24/2016   CALCIUM 8.5 (L) 02/11/2023   ALBUMIN 2.2 (L) 02/11/2023   PHOS 4.4 02/11/2023    CBC Recent Labs  Lab 02/09/23 0659 02/10/23 0548 02/11/23 0810  WBC 5.7 5.5 7.8  HGB 8.0* 7.5* 7.3*  HCT 24.8* 22.5* 23.5*  MCV 90.2 90.0 93.3  PLT 179 179 190

## 2023-02-12 NOTE — Discharge Instructions (Signed)
[facility]  Discharge Instructions  AV Fistula or Graft Surgery for Dialysis Access  Please refer to the following instructions for your post-procedure care. Your surgeon or physician assistant will discuss any changes with you.  Activity  You may drive the day following your surgery, if you are comfortable and no longer taking prescription pain medication. Resume full activity as the soreness in your incision resolves.  Bathing/Showering  You may shower after you go home. Keep your incision dry for 48 hours. Do not soak in a bathtub, hot tub, or swim until the incision heals completely. You may not shower if you have a hemodialysis catheter.  Incision Care  Clean your incision with mild soap and water after 48 hours. Pat the area dry with a clean towel. You do not need a bandage unless otherwise instructed. Do not apply any ointments or creams to your incision. You may have skin glue on your incision. Do not peel it off. It will come off on its own in about one week. Your arm may swell a bit after surgery. To reduce swelling use pillows to elevate your arm so it is above your heart. Your doctor will tell you if you need to lightly wrap your arm with an ACE bandage.  Diet  Resume your normal diet. There are not special food restrictions following this procedure. In order to heal from your surgery, it is CRITICAL to get adequate nutrition. Your body requires vitamins, minerals, and protein. Vegetables are the best source of vitamins and minerals. Vegetables also provide the perfect balance of protein. Processed food has little nutritional value, so try to avoid this.  Medications  Resume taking all of your medications. If your incision is causing pain, you may take over-the counter pain relievers such as acetaminophen (Tylenol). If you were prescribed a stronger pain medication, please be aware these medications can cause nausea and constipation. Prevent  nausea by taking the medication with a snack or meal. Avoid constipation by drinking plenty of fluids and eating foods with high amount of fiber, such as fruits, vegetables, and grains.  Do not take Tylenol if you are taking prescription pain medications.  Follow up Your surgeon may want to see you in the office following your access surgery. If so, this will be arranged at the time of your surgery.  Please call us immediately for any of the following conditions:  Increased pain, redness, drainage (pus) from your incision site Fever of 101 degrees or higher Severe or worsening pain at your incision site Hand pain or numbness.  Reduce your risk of vascular disease:  Stop smoking. If you would like help, call [facility] at 1-800-QUIT-NOW (712) 380-4739) or [place] at [place] your cholesterol Maintain a desired weight Control your diabetes Keep your blood pressure down  Dialysis  It will take several weeks to several months for your new dialysis access to be ready for use. Your surgeon will determine when it is okay to use it. Your nephrologist will continue to direct your dialysis. You can continue to use your Permcath until your new access is ready for use.   02/12/2023 Ryan Ducre [id number] 08-03-1956  Surgeon(s): Broadus John, MD  Procedure(s): LEFT ARM BRACHIOCEPHALIC ARTERIOVENOUS (AV) FISTULA CREATION   May stick graft immediately   May stick graft on designated area only:   x Do not stick fistula for 12 weeks    If you have any questions, please call the office at  [phone].  

## 2023-02-12 NOTE — Progress Notes (Signed)
[place] with Colette Ribas (granddaughter). She is to pick up pt around 1200.

## 2023-02-13 LAB — CBC
HCT: 23.2 % — ABNORMAL LOW (ref 36.0–46.0)
Hemoglobin: 7.2 g/dL — ABNORMAL LOW (ref 12.0–15.0)
MCH: 29 pg (ref 26.0–34.0)
MCHC: 31 g/dL (ref 30.0–36.0)
MCV: 93.5 fL (ref 80.0–100.0)
Platelets: 219 10*3/uL (ref 150–400)
RBC: 2.48 MIL/uL — ABNORMAL LOW (ref 3.87–5.11)
RDW: 14.8 % (ref 11.5–15.5)
WBC: 6.3 10*3/uL (ref 4.0–10.5)
nRBC: 0 % (ref 0.0–0.2)

## 2023-02-13 LAB — RENAL FUNCTION PANEL
Albumin: 2.3 g/dL — ABNORMAL LOW (ref 3.5–5.0)
Anion gap: 11 (ref 5–15)
BUN: 26 mg/dL — ABNORMAL HIGH (ref 8–23)
CO2: 26 mmol/L (ref 22–32)
Calcium: 8.4 mg/dL — ABNORMAL LOW (ref 8.9–10.3)
Chloride: 96 mmol/L — ABNORMAL LOW (ref 98–111)
Creatinine, Ser: 5.16 mg/dL — ABNORMAL HIGH (ref 0.44–1.00)
GFR, Estimated: 9 mL/min — ABNORMAL LOW (ref >60)
Glucose, Bld: 161 mg/dL — ABNORMAL HIGH (ref 70–99)
Phosphorus: 5.3 mg/dL — ABNORMAL HIGH (ref 2.5–4.6)
Potassium: 3.8 mmol/L (ref 3.5–5.1)
Sodium: 133 mmol/L — ABNORMAL LOW (ref 135–145)

## 2023-02-13 LAB — HEPATITIS B SURFACE ANTIGEN: Hepatitis B Surface Ag: NONREACTIVE

## 2023-02-13 LAB — TROPONIN I (HIGH SENSITIVITY): Troponin I (High Sensitivity): 12 ng/L (ref <18)

## 2023-02-13 MED ORDER — PENTAFLUOROPROP-TETRAFLUOROETH EX AERO: 1.0000 | INHALATION_SPRAY | CUTANEOUS | Status: DC | PRN

## 2023-02-13 MED ORDER — ALTEPLASE 2 MG IJ SOLR: 2.0000 mg | Freq: Once | INTRAMUSCULAR | Status: DC | PRN

## 2023-02-13 MED ORDER — LIDOCAINE-PRILOCAINE 2.5-2.5 % EX CREA: 1.0000 | TOPICAL_CREAM | CUTANEOUS | Status: DC | PRN

## 2023-02-13 MED ORDER — HEPARIN SODIUM (PORCINE) 1000 UNIT/ML DIALYSIS: 1000.0000 [IU] | INTRAMUSCULAR | Status: DC | PRN

## 2023-02-13 MED ORDER — ANTICOAGULANT SODIUM CITRATE 4% (200MG/5ML) IV SOLN: 5.0000 mL | Status: DC | PRN

## 2023-02-13 MED ORDER — DARBEPOETIN ALFA 100 MCG/0.5ML IJ SOSY
100.0000 ug | PREFILLED_SYRINGE | INTRAMUSCULAR | Status: DC
  Filled 2023-02-13: qty 0.5

## 2023-02-13 MED ORDER — LIDOCAINE HCL (PF) 1 % IJ SOLN: 5.0000 mL | INTRAMUSCULAR | Status: DC | PRN

## 2023-02-13 MED ORDER — HEPARIN SODIUM (PORCINE) 1000 UNIT/ML IJ SOLN
INTRAMUSCULAR | Status: AC
  Administered 2023-02-13: 3800 [IU]
  Filled 2023-02-13: qty 4

## 2023-02-13 MED ORDER — CHLORHEXIDINE GLUCONATE CLOTH 2 % EX PADS: 6.0000 | MEDICATED_PAD | Freq: Every day | CUTANEOUS | Status: DC

## 2023-02-13 NOTE — ED Notes (Signed)
Patient resting with eyes closed. Patient connected to the monitor. No acute concerns at this time.

## 2023-02-13 NOTE — ED Provider Notes (Signed)
I have discussed the case with Dr. Candiss Norse of nephrology service, who agrees to arrange for routine dialysis.   Delora Fuel, MD [phone]

## 2023-02-13 NOTE — ED Notes (Signed)
Rivka Barbara (daughter) [phone]

## 2023-02-13 NOTE — Progress Notes (Signed)
CSW spoke with patients son Idelle Leech and patients granddaughter Colette Ribas. An voicemail was left with patients daughter Rivka Barbara. CSW went over the recommendations for physical therapy and that home health PT was recommended. Family agrees with recommendations and would like home health and the rolling walker. CSW contacted Malachy Mood with [facility] who stated she can begin services on Tuesday 02/17/2023 for HHPT. CSW contacted [employer] to order the roller walker to patients room prior to discharge. Seleta Rhymes can pick patient up if its before 5:00 PM

## 2023-02-13 NOTE — Progress Notes (Addendum)
Pt known to navigator due to recent admission. Pt is a new HD pt and was set-up at TCU at [facility] for HD treatments on Mon,Tues,Thurs, Fri with 7:30 am chair time. Pt was supposed to start at clinic today but had to return to hospital for assessment. Spoke to [place] at [address] to advise clinic staff why pt not at appt this am. Should pt require snf placement for rehab, new clinic placement will need to be found according to where snf will transport pt for HD treatments. Pts who are at snf do qualify for TCU program. Should pt return home, then pt can start at [place] [facility] on Monday. Case discussed with renal PA. Will assist as needed.   Melven Sartorius Renal Navigator [phone]  Addendum at 3:03 pm: Pt's chart reviewed. Appears pt will return home with [facility]. Pt's out-pt HD arrangements added to AVS. Contacted TCU and spoke to [place], Therapist, sports. Jolayne Haines provided update and advised pt should start on Monday.

## 2023-02-13 NOTE — Discharge Instructions (Signed)
We evaluated you for your weakness.  Your laboratory testing was reassuring and your symptoms improved with dialysis.  You were evaluated by physical therapy who recommended home physical therapy.  We have placed an order for this and our social worker will help arrange this.  We have also given you a walker to use at home.  Please be careful when ambulating to avoid falls.  Please return if you develop any new symptoms such as chest pain, shortness of breath, abdominal pain, fevers or chills, lightheadedness or dizziness, fainting, nausea or vomiting, or any other concerning symptoms.

## 2023-02-13 NOTE — ED Notes (Signed)
Tranfer of care report given to hemodialysis RN.

## 2023-02-13 NOTE — Progress Notes (Signed)
Received patient in bed,awake,alert and oriented x 3.She consented her treatment today.  Access used :Right Hd catheter that worked well .Dressing on date.  Medicine given: None.  Duration of treatment : 3 hours  Fluid removed : 2900 cc.  Hemodialysis issue/comment: None.  Hand off to the patient's nurse.

## 2023-02-13 NOTE — Procedures (Signed)
I was present at this dialysis session. I have reviewed the session itself and made appropriate changes.   Filed Weights   02/12/23 2107  Weight: 57.6 kg    Recent Labs  Lab 02/13/23 0854  NA 133*  K 3.8  CL 96*  CO2 26  GLUCOSE 161*  BUN 26*  CREATININE 5.16*  CALCIUM 8.4*  PHOS 5.3*    Recent Labs  Lab 02/11/23 0810 02/12/23 2119 02/13/23 0853  WBC 7.8 7.6 6.3  NEUTROABS  --  5.4  --   HGB 7.3* 8.5* 7.2*  HCT 23.5* 26.6* 23.2*  MCV 93.3 92.7 93.5  PLT 190 259 219    Scheduled Meds:  Chlorhexidine Gluconate Cloth  6 each Topical Q0600   heparin sodium (porcine)       Continuous Infusions:  anticoagulant sodium citrate     PRN Meds:.alteplase, anticoagulant sodium citrate, heparin, heparin sodium (porcine), lidocaine (PF), lidocaine-prilocaine, pentafluoroprop-tetrafluoroeth   Santiago Bumpers,  MD 02/13/2023, 9:46 AM

## 2023-02-13 NOTE — Evaluation (Signed)
Physical Therapy Evaluation Patient Details Name: Tricia Huynh MRN: [id number] DOB: Jan 30, 1956 Today's Date: 02/13/2023  History of Present Illness  67 y.o. female presents to [facility] hospital on 3/28 after being discharged earlier in the same day s/p AV fistula placement. Pt reports weakness, epigastric pain and SOB. Work-up unremarkable for acute processes. PMH includes CKD IV, DMII.  Clinical Impression  Pt presents to PT with deficits in strength, power, endurance, gait. Pt demonstrates generalized weakness at this time, but is able to transfer and ambulate without physical assistance. Pt benefits from UE support of RW when standing at this time to improve stability and reduce falls risk. Pt will benefit from frequent mobilization in an effort to improve endurance and strength. Pt expresses the desire to return home with continued support of granddaughters. Pt will benefit from HHPT along with a RW.     Recommendations for follow up therapy are one component of a multi-disciplinary discharge planning process, led by the attending physician.  Recommendations may be updated based on patient status, additional functional criteria and insurance authorization.  Follow Up Recommendations       Assistance Recommended at Discharge PRN  Patient can return home with the following  A little help with bathing/dressing/bathroom;Assistance with cooking/housework;Assist for transportation;Help with stairs or ramp for entrance    Equipment Recommendations Rolling walker (2 wheels)  Recommendations for Other Services       Functional Status Assessment Patient has had a recent decline in their functional status and demonstrates the ability to make significant improvements in function in a reasonable and predictable amount of time.     Precautions / Restrictions Precautions Precautions: Fall Restrictions Weight Bearing Restrictions: No      Mobility  Bed Mobility Overal bed mobility:  Modified Independent             General bed mobility comments: increased time    Transfers Overall transfer level: Needs assistance Equipment used: None Transfers: Sit to/from Stand Sit to Stand: Supervision           General transfer comment: unilateral UE support of bed    Ambulation/Gait Ambulation/Gait assistance: Supervision Gait Distance (Feet): 100 Feet Assistive device: Rolling walker (2 wheels) Gait Pattern/deviations: Step-to pattern, Shuffle Gait velocity: reduced Gait velocity interpretation: <1.8 ft/sec, indicate of risk for recurrent falls   General Gait Details: slowed shuffling gait (possibly related to pt wearing bedroom slippers)  Stairs            Wheelchair Mobility    Modified Rankin (Stroke Patients Only)       Balance Overall balance assessment: Needs assistance Sitting-balance support: No upper extremity supported, Feet supported Sitting balance-Leahy Scale: Good     Standing balance support: Bilateral upper extremity supported, Reliant on assistive device for balance Standing balance-Leahy Scale: Poor                               Pertinent Vitals/Pain Pain Assessment Pain Assessment: No/denies pain    Home Living Family/patient expects to be discharged to:: Private residence Living Arrangements: Other relatives (granddaughters) Available Help at Discharge: Family;Available 24 hours/day (someone is always present at home per patient) Type of Home: House Home Access: Stairs to enter Entrance Stairs-Rails: Right;Left;Can reach both Entrance Stairs-Number of Steps: 5   Home Layout: One level Home Equipment: None      Prior Function Prior Level of Function : Independent/Modified Independent  Mobility Comments: ambulates without DME, past admission indicates pt utilizes support of furniture/walls ADLs Comments: pt reported she does not need any ADL help. she doesn't drive     Hand  Dominance   Dominant Hand: Right    Extremity/Trunk Assessment   Upper Extremity Assessment Upper Extremity Assessment: Overall WFL for tasks assessed    Lower Extremity Assessment Lower Extremity Assessment: Generalized weakness    Cervical / Trunk Assessment Cervical / Trunk Assessment: Normal  Communication   Communication: No difficulties  Cognition Arousal/Alertness: Awake/alert Behavior During Therapy: WFL for tasks assessed/performed Overall Cognitive Status: Within Functional Limits for tasks assessed                                          General Comments General comments (skin integrity, edema, etc.): VSS on RA    Exercises     Assessment/Plan    PT Assessment Patient needs continued PT services  PT Problem List Decreased strength;Decreased activity tolerance;Decreased balance;Decreased mobility;Decreased knowledge of use of DME       PT Treatment Interventions DME instruction;Gait training;Stair training;Functional mobility training;Therapeutic activities;Therapeutic exercise;Balance training;Neuromuscular re-education;Patient/family education    PT Goals (Current goals can be found in the Care Plan section)  Acute Rehab PT Goals Patient Stated Goal: to go home PT Goal Formulation: With patient Time For Goal Achievement: 02/27/23 Potential to Achieve Goals: Good Additional Goals Additional Goal #1: Pt will score >19/24 on the DGI to indicate a reduced risk for falls    Frequency Min 3X/week     Co-evaluation               AM-PAC PT "6 Clicks" Mobility  Outcome Measure Help needed turning from your back to your side while in a flat bed without using bedrails?: None Help needed moving from lying on your back to sitting on the side of a flat bed without using bedrails?: None Help needed moving to and from a bed to a chair (including a wheelchair)?: A Little Help needed standing up from a chair using your arms (e.g., wheelchair  or bedside chair)?: A Little Help needed to walk in hospital room?: A Little Help needed climbing 3-5 steps with a railing? : A Little 6 Click Score: 20    End of Session   Activity Tolerance: Patient tolerated treatment well Patient left: in bed (pt on hallway stretcher) Nurse Communication: Mobility status PT Visit Diagnosis: Other abnormalities of gait and mobility (R26.89);Muscle weakness (generalized) (M62.81)    Time: [id number] PT Time Calculation (min) (ACUTE ONLY): 15 min   Charges:   PT Evaluation $PT Eval Low Complexity: [place], PT, DPT Acute Rehabilitation Office [phone]   Zenaida Niece 02/13/2023, 1:08 PM

## 2023-02-13 NOTE — Progress Notes (Signed)
Nephrology Follow-Up Consult note   Assessment/Recommendations: Tricia Huynh is a/an 67 y.o. female with a past medical history significant for CKD 4, admitted for AKI.       AKI on CKD 4 progressed to ESRD - b/l creat 2.2- 2.9 from late 2023, eGFR 17- 24 ml/min. Creat here was 4.4 here w/ signs c/w ATN. Crt did not improve and discussed w/ outpt MD who agreed on initiating HD and likely progressed to ESRD. TDC placed. VVS w/ bc avf on 3/29. Back in hospital due to debilitation. Continue HD MWF while here. HTN - cont home meds Anemia esrd - Hgb decreased to 7s with low iron. Resume ESA. IV iron given last admission Chest pain - resolved SOB - unclear cause. UF with HD. Appears comfortable today IDDM - per pmd  Dispo: Patient okay to DC today from my perspective. DC planning per primary, possible SNF needs. If to SNF HD planning may need to be adjusted. If home can start TCU Monday.     Recommendations conveyed to primary service.    [facility] 02/13/2023 9:43 AM  ___________________________________________________________  CC: AKI on CKD 4  Interval History/Subjective: Pt discharged yesterday but returned due to concern for debilitation, nausea, and SOB. Patient seems comfortable this morning on HD. No concerns   Medications:  Current Facility-Administered Medications  Medication Dose Route Frequency Provider Last Rate Last Admin   alteplase (CATHFLO ACTIVASE) injection 2 mg  2 mg Intracatheter Once PRN Gean Quint, MD       anticoagulant sodium citrate solution 5 mL  5 mL Intracatheter PRN Gean Quint, MD       Chlorhexidine Gluconate Cloth 2 % PADS 6 each  6 each Topical Q0600 Gean Quint, MD       heparin injection 1,000 Units  1,000 Units Intracatheter PRN Gean Quint, MD       heparin sodium (porcine) 1000 UNIT/ML injection            lidocaine (PF) (XYLOCAINE) 1 % injection 5 mL  5 mL Intradermal PRN Gean Quint, MD        lidocaine-prilocaine (EMLA) cream 1 Application  1 Application Topical PRN Gean Quint, MD       pentafluoroprop-tetrafluoroeth (GEBAUERS) aerosol 1 Application  1 Application Topical PRN Gean Quint, MD          Review of Systems: 10 systems reviewed and negative except per interval history/subjective  Physical Exam: Vitals:   02/13/23 0900 02/13/23 0933  BP: (!) 160/73 (!) 149/79  Pulse: 84 90  Resp: 13 15  Temp:    SpO2: 99% 99%   No intake/output data recorded. No intake or output data in the 24 hours ending 02/13/23 0943  Constitutional: thin, no acute distress ENMT: ears and nose without scars or lesions, MMM CV: normal rate, no edema Respiratory: bilateral chest rise, normal work of breathing Gastrointestinal: soft, non-tender, no palpable masses or hernias Skin: no visible lesions or rashes Psych: appropriate mood and affect Access: Right chest catheter    Test Results I personally reviewed new and old clinical labs and radiology tests Lab Results  Component Value Date   NA 133 (L) 02/13/2023   K 3.8 02/13/2023   CL 96 (L) 02/13/2023   CO2 26 02/13/2023   BUN 26 (H) 02/13/2023   CREATININE 5.16 (H) 02/13/2023   GFR 56.09 (L) 12/24/2016   CALCIUM 8.4 (L) 02/13/2023   ALBUMIN 2.3 (L) 02/13/2023   PHOS 5.3 (H) 02/13/2023  CBC Recent Labs  Lab 02/11/23 0810 02/12/23 2119 02/13/23 0853  WBC 7.8 7.6 6.3  NEUTROABS  --  5.4  --   HGB 7.3* 8.5* 7.2*  HCT 23.5* 26.6* 23.2*  MCV 93.3 92.7 93.5  PLT 190 259 219

## 2023-02-13 NOTE — ED Provider Notes (Signed)
  Physical Exam  BP 131/82 (BP Location: Right Arm)   Pulse 99   Temp (!) 97.5 F (36.4 C) (Oral)   Resp 20   Wt 57.6 kg   SpO2 100%   BMI 19.31 kg/m   Physical Exam Vitals and nursing note reviewed.  Constitutional:      Appearance: Normal appearance.  HENT:     Head: Normocephalic and atraumatic.     Mouth/Throat:     Mouth: Mucous membranes are moist.  Eyes:     Conjunctiva/sclera: Conjunctivae normal.  Cardiovascular:     Rate and Rhythm: Normal rate.  Pulmonary:     Effort: Pulmonary effort is normal. No respiratory distress.     Breath sounds: No decreased breath sounds, wheezing, rhonchi or rales.  Abdominal:     General: Abdomen is flat.  Musculoskeletal:        General: No deformity.     Right lower leg: No edema.     Left lower leg: No edema.  Skin:    General: Skin is warm and dry.     Capillary Refill: Capillary refill takes less than 2 seconds.  Neurological:     General: No focal deficit present.     Mental Status: She is alert. Mental status is at baseline.  Psychiatric:        Mood and Affect: Mood normal.        Behavior: Behavior normal.     Procedures  Procedures  ED Course / MDM   Clinical Course as of 02/13/23 1403  Fri Feb 13, 2023  1400 Patient seen yesterday evening.  Presented to the emergency department with weakness and shortness of breath.  Staying with daughter who felt overwhelmed with helping arrange home health.  Her medical workup was reassuring but the patient was very weak and unable to ambulate without assistance.  She was taken to dialysis and completed this without incident.  She reports that she feels better after dialysis.  Patient was evaluated physical therapist, who recommended home health physical therapy which has been ordered.  She will also be given a front wheeled walker.  Social worker has helped arrange home health.  Patient reports she feels a lot better after undergoing dialysis. Will discharge patient to home. All  questions answered. Patient comfortable with plan of discharge. Return precautions discussed with patient and specified on the after visit summary. [WS]    Clinical Course User Index [WS] Cristie Hem, MD   Medical Decision Making Amount and/or Complexity of Data Reviewed Labs: ordered. Radiology: ordered.  Risk OTC drugs.     Cristie Hem, MD 02/13/23 [phone]

## 2023-02-14 LAB — HEPATITIS B SURFACE ANTIBODY, QUANTITATIVE: Hep B S AB Quant (Post): <3.5 m[IU]/mL — ABNORMAL LOW (ref >9.9)

## 2023-02-16 DIAGNOSIS — N186 End stage renal disease: Secondary | ICD-10-CM | POA: Diagnosis not present

## 2023-02-16 DIAGNOSIS — I132 Hypertensive heart and chronic kidney disease with heart failure and with stage 5 chronic kidney disease, or end stage renal disease: Secondary | ICD-10-CM | POA: Diagnosis not present

## 2023-02-16 DIAGNOSIS — Z794 Long term (current) use of insulin: Secondary | ICD-10-CM | POA: Diagnosis not present

## 2023-02-16 DIAGNOSIS — Z87891 Personal history of nicotine dependence: Secondary | ICD-10-CM | POA: Diagnosis not present

## 2023-02-16 DIAGNOSIS — Z992 Dependence on renal dialysis: Secondary | ICD-10-CM | POA: Diagnosis not present

## 2023-02-16 DIAGNOSIS — E1122 Type 2 diabetes mellitus with diabetic chronic kidney disease: Secondary | ICD-10-CM | POA: Diagnosis not present

## 2023-02-16 DIAGNOSIS — Z87440 Personal history of urinary (tract) infections: Secondary | ICD-10-CM | POA: Diagnosis not present

## 2023-02-16 DIAGNOSIS — D509 Iron deficiency anemia, unspecified: Secondary | ICD-10-CM | POA: Diagnosis not present

## 2023-02-16 DIAGNOSIS — I509 Heart failure, unspecified: Secondary | ICD-10-CM | POA: Diagnosis not present

## 2023-02-17 ENCOUNTER — Telehealth: Payer: Self-pay | Admitting: Vascular Surgery

## 2023-02-17 DIAGNOSIS — Z87891 Personal history of nicotine dependence: Secondary | ICD-10-CM | POA: Diagnosis not present

## 2023-02-17 DIAGNOSIS — Z992 Dependence on renal dialysis: Secondary | ICD-10-CM | POA: Diagnosis not present

## 2023-02-17 DIAGNOSIS — E1122 Type 2 diabetes mellitus with diabetic chronic kidney disease: Secondary | ICD-10-CM | POA: Diagnosis not present

## 2023-02-17 DIAGNOSIS — D509 Iron deficiency anemia, unspecified: Secondary | ICD-10-CM | POA: Diagnosis not present

## 2023-02-17 DIAGNOSIS — Z794 Long term (current) use of insulin: Secondary | ICD-10-CM | POA: Diagnosis not present

## 2023-02-17 DIAGNOSIS — N186 End stage renal disease: Secondary | ICD-10-CM | POA: Diagnosis not present

## 2023-02-17 DIAGNOSIS — I509 Heart failure, unspecified: Secondary | ICD-10-CM | POA: Diagnosis not present

## 2023-02-17 DIAGNOSIS — I132 Hypertensive heart and chronic kidney disease with heart failure and with stage 5 chronic kidney disease, or end stage renal disease: Secondary | ICD-10-CM | POA: Diagnosis not present

## 2023-02-17 DIAGNOSIS — Z87440 Personal history of urinary (tract) infections: Secondary | ICD-10-CM | POA: Diagnosis not present

## 2023-02-17 NOTE — Telephone Encounter (Signed)
-----   Message from [facility], [place] sent at 02/12/2023 11:28 AM EDT ----- S/p creation of left brachiocephalic fistula, [phone]  Please arrange follow up with PA in 6 wks with dialysis duplex, on [place] office day

## 2023-02-19 DIAGNOSIS — I132 Hypertensive heart and chronic kidney disease with heart failure and with stage 5 chronic kidney disease, or end stage renal disease: Secondary | ICD-10-CM | POA: Diagnosis not present

## 2023-02-19 DIAGNOSIS — Z794 Long term (current) use of insulin: Secondary | ICD-10-CM | POA: Diagnosis not present

## 2023-02-19 DIAGNOSIS — D509 Iron deficiency anemia, unspecified: Secondary | ICD-10-CM | POA: Diagnosis not present

## 2023-02-19 DIAGNOSIS — N186 End stage renal disease: Secondary | ICD-10-CM | POA: Diagnosis not present

## 2023-02-19 DIAGNOSIS — Z87891 Personal history of nicotine dependence: Secondary | ICD-10-CM | POA: Diagnosis not present

## 2023-02-19 DIAGNOSIS — Z87440 Personal history of urinary (tract) infections: Secondary | ICD-10-CM | POA: Diagnosis not present

## 2023-02-19 DIAGNOSIS — E1122 Type 2 diabetes mellitus with diabetic chronic kidney disease: Secondary | ICD-10-CM | POA: Diagnosis not present

## 2023-02-19 DIAGNOSIS — I509 Heart failure, unspecified: Secondary | ICD-10-CM | POA: Diagnosis not present

## 2023-02-19 DIAGNOSIS — Z992 Dependence on renal dialysis: Secondary | ICD-10-CM | POA: Diagnosis not present

## 2023-02-20 DIAGNOSIS — Z794 Long term (current) use of insulin: Secondary | ICD-10-CM | POA: Diagnosis not present

## 2023-02-20 DIAGNOSIS — Z87440 Personal history of urinary (tract) infections: Secondary | ICD-10-CM | POA: Diagnosis not present

## 2023-02-20 DIAGNOSIS — I509 Heart failure, unspecified: Secondary | ICD-10-CM | POA: Diagnosis not present

## 2023-02-20 DIAGNOSIS — I132 Hypertensive heart and chronic kidney disease with heart failure and with stage 5 chronic kidney disease, or end stage renal disease: Secondary | ICD-10-CM | POA: Diagnosis not present

## 2023-02-20 DIAGNOSIS — D509 Iron deficiency anemia, unspecified: Secondary | ICD-10-CM | POA: Diagnosis not present

## 2023-02-20 DIAGNOSIS — N186 End stage renal disease: Secondary | ICD-10-CM | POA: Diagnosis not present

## 2023-02-20 DIAGNOSIS — Z992 Dependence on renal dialysis: Secondary | ICD-10-CM | POA: Diagnosis not present

## 2023-02-20 DIAGNOSIS — E1122 Type 2 diabetes mellitus with diabetic chronic kidney disease: Secondary | ICD-10-CM | POA: Diagnosis not present

## 2023-02-20 DIAGNOSIS — Z87891 Personal history of nicotine dependence: Secondary | ICD-10-CM | POA: Diagnosis not present

## 2023-02-23 DIAGNOSIS — Z794 Long term (current) use of insulin: Secondary | ICD-10-CM | POA: Diagnosis not present

## 2023-02-23 DIAGNOSIS — N186 End stage renal disease: Secondary | ICD-10-CM | POA: Diagnosis not present

## 2023-02-23 DIAGNOSIS — Z87891 Personal history of nicotine dependence: Secondary | ICD-10-CM | POA: Diagnosis not present

## 2023-02-23 DIAGNOSIS — Z87440 Personal history of urinary (tract) infections: Secondary | ICD-10-CM | POA: Diagnosis not present

## 2023-02-23 DIAGNOSIS — D509 Iron deficiency anemia, unspecified: Secondary | ICD-10-CM | POA: Diagnosis not present

## 2023-02-23 DIAGNOSIS — I509 Heart failure, unspecified: Secondary | ICD-10-CM | POA: Diagnosis not present

## 2023-02-23 DIAGNOSIS — E1122 Type 2 diabetes mellitus with diabetic chronic kidney disease: Secondary | ICD-10-CM | POA: Diagnosis not present

## 2023-02-23 DIAGNOSIS — Z992 Dependence on renal dialysis: Secondary | ICD-10-CM | POA: Diagnosis not present

## 2023-02-23 DIAGNOSIS — I132 Hypertensive heart and chronic kidney disease with heart failure and with stage 5 chronic kidney disease, or end stage renal disease: Secondary | ICD-10-CM | POA: Diagnosis not present

## 2023-02-24 ENCOUNTER — Encounter (HOSPITAL_COMMUNITY): Payer: Self-pay

## 2023-02-24 DIAGNOSIS — Z992 Dependence on renal dialysis: Secondary | ICD-10-CM | POA: Diagnosis not present

## 2023-02-24 DIAGNOSIS — I509 Heart failure, unspecified: Secondary | ICD-10-CM | POA: Diagnosis not present

## 2023-02-24 DIAGNOSIS — D509 Iron deficiency anemia, unspecified: Secondary | ICD-10-CM | POA: Diagnosis not present

## 2023-02-24 DIAGNOSIS — I132 Hypertensive heart and chronic kidney disease with heart failure and with stage 5 chronic kidney disease, or end stage renal disease: Secondary | ICD-10-CM | POA: Diagnosis not present

## 2023-02-24 DIAGNOSIS — Z87891 Personal history of nicotine dependence: Secondary | ICD-10-CM | POA: Diagnosis not present

## 2023-02-24 DIAGNOSIS — Z794 Long term (current) use of insulin: Secondary | ICD-10-CM | POA: Diagnosis not present

## 2023-02-24 DIAGNOSIS — E1122 Type 2 diabetes mellitus with diabetic chronic kidney disease: Secondary | ICD-10-CM | POA: Diagnosis not present

## 2023-02-24 DIAGNOSIS — N186 End stage renal disease: Secondary | ICD-10-CM | POA: Diagnosis not present

## 2023-02-24 DIAGNOSIS — Z87440 Personal history of urinary (tract) infections: Secondary | ICD-10-CM | POA: Diagnosis not present

## 2023-02-25 LAB — GLUCOSE, CAPILLARY
Glucose-Capillary: 254 mg/dL — ABNORMAL HIGH (ref 70–99)
Glucose-Capillary: 195 mg/dL — ABNORMAL HIGH (ref 70–99)
Glucose-Capillary: 217 mg/dL — ABNORMAL HIGH (ref 70–99)

## 2023-02-26 DIAGNOSIS — Z992 Dependence on renal dialysis: Secondary | ICD-10-CM | POA: Diagnosis not present

## 2023-02-26 DIAGNOSIS — N186 End stage renal disease: Secondary | ICD-10-CM | POA: Diagnosis not present

## 2023-02-26 DIAGNOSIS — E1122 Type 2 diabetes mellitus with diabetic chronic kidney disease: Secondary | ICD-10-CM | POA: Diagnosis not present

## 2023-02-26 DIAGNOSIS — Z794 Long term (current) use of insulin: Secondary | ICD-10-CM | POA: Diagnosis not present

## 2023-02-26 DIAGNOSIS — Z87440 Personal history of urinary (tract) infections: Secondary | ICD-10-CM | POA: Diagnosis not present

## 2023-02-26 DIAGNOSIS — D509 Iron deficiency anemia, unspecified: Secondary | ICD-10-CM | POA: Diagnosis not present

## 2023-02-26 DIAGNOSIS — Z87891 Personal history of nicotine dependence: Secondary | ICD-10-CM | POA: Diagnosis not present

## 2023-02-26 DIAGNOSIS — I132 Hypertensive heart and chronic kidney disease with heart failure and with stage 5 chronic kidney disease, or end stage renal disease: Secondary | ICD-10-CM | POA: Diagnosis not present

## 2023-02-26 DIAGNOSIS — I509 Heart failure, unspecified: Secondary | ICD-10-CM | POA: Diagnosis not present

## 2023-02-27 DIAGNOSIS — E785 Hyperlipidemia, unspecified: Secondary | ICD-10-CM | POA: Diagnosis not present

## 2023-02-27 DIAGNOSIS — D509 Iron deficiency anemia, unspecified: Secondary | ICD-10-CM | POA: Diagnosis not present

## 2023-02-27 DIAGNOSIS — Z87440 Personal history of urinary (tract) infections: Secondary | ICD-10-CM | POA: Diagnosis not present

## 2023-02-27 DIAGNOSIS — I10 Essential (primary) hypertension: Secondary | ICD-10-CM | POA: Diagnosis not present

## 2023-02-27 DIAGNOSIS — I509 Heart failure, unspecified: Secondary | ICD-10-CM | POA: Diagnosis not present

## 2023-02-27 DIAGNOSIS — I132 Hypertensive heart and chronic kidney disease with heart failure and with stage 5 chronic kidney disease, or end stage renal disease: Secondary | ICD-10-CM | POA: Diagnosis not present

## 2023-02-27 DIAGNOSIS — Z87891 Personal history of nicotine dependence: Secondary | ICD-10-CM | POA: Diagnosis not present

## 2023-02-27 DIAGNOSIS — E1142 Type 2 diabetes mellitus with diabetic polyneuropathy: Secondary | ICD-10-CM | POA: Diagnosis not present

## 2023-02-27 DIAGNOSIS — N186 End stage renal disease: Secondary | ICD-10-CM | POA: Diagnosis not present

## 2023-02-27 DIAGNOSIS — E1122 Type 2 diabetes mellitus with diabetic chronic kidney disease: Secondary | ICD-10-CM | POA: Diagnosis not present

## 2023-02-27 DIAGNOSIS — N184 Chronic kidney disease, stage 4 (severe): Secondary | ICD-10-CM | POA: Diagnosis not present

## 2023-02-27 DIAGNOSIS — M818 Other osteoporosis without current pathological fracture: Secondary | ICD-10-CM | POA: Diagnosis not present

## 2023-02-27 DIAGNOSIS — Z794 Long term (current) use of insulin: Secondary | ICD-10-CM | POA: Diagnosis not present

## 2023-02-27 DIAGNOSIS — Z992 Dependence on renal dialysis: Secondary | ICD-10-CM | POA: Diagnosis not present

## 2023-03-02 DIAGNOSIS — I132 Hypertensive heart and chronic kidney disease with heart failure and with stage 5 chronic kidney disease, or end stage renal disease: Secondary | ICD-10-CM | POA: Diagnosis not present

## 2023-03-02 DIAGNOSIS — Z87440 Personal history of urinary (tract) infections: Secondary | ICD-10-CM | POA: Diagnosis not present

## 2023-03-02 DIAGNOSIS — I509 Heart failure, unspecified: Secondary | ICD-10-CM | POA: Diagnosis not present

## 2023-03-02 DIAGNOSIS — Z992 Dependence on renal dialysis: Secondary | ICD-10-CM | POA: Diagnosis not present

## 2023-03-02 DIAGNOSIS — E1122 Type 2 diabetes mellitus with diabetic chronic kidney disease: Secondary | ICD-10-CM | POA: Diagnosis not present

## 2023-03-02 DIAGNOSIS — Z794 Long term (current) use of insulin: Secondary | ICD-10-CM | POA: Diagnosis not present

## 2023-03-02 DIAGNOSIS — D509 Iron deficiency anemia, unspecified: Secondary | ICD-10-CM | POA: Diagnosis not present

## 2023-03-02 DIAGNOSIS — Z87891 Personal history of nicotine dependence: Secondary | ICD-10-CM | POA: Diagnosis not present

## 2023-03-02 DIAGNOSIS — N186 End stage renal disease: Secondary | ICD-10-CM | POA: Diagnosis not present

## 2023-03-03 DIAGNOSIS — D509 Iron deficiency anemia, unspecified: Secondary | ICD-10-CM | POA: Diagnosis not present

## 2023-03-03 DIAGNOSIS — I509 Heart failure, unspecified: Secondary | ICD-10-CM | POA: Diagnosis not present

## 2023-03-03 DIAGNOSIS — Z87891 Personal history of nicotine dependence: Secondary | ICD-10-CM | POA: Diagnosis not present

## 2023-03-03 DIAGNOSIS — Z992 Dependence on renal dialysis: Secondary | ICD-10-CM | POA: Diagnosis not present

## 2023-03-03 DIAGNOSIS — Z87440 Personal history of urinary (tract) infections: Secondary | ICD-10-CM | POA: Diagnosis not present

## 2023-03-03 DIAGNOSIS — E1122 Type 2 diabetes mellitus with diabetic chronic kidney disease: Secondary | ICD-10-CM | POA: Diagnosis not present

## 2023-03-03 DIAGNOSIS — N186 End stage renal disease: Secondary | ICD-10-CM | POA: Diagnosis not present

## 2023-03-03 DIAGNOSIS — I132 Hypertensive heart and chronic kidney disease with heart failure and with stage 5 chronic kidney disease, or end stage renal disease: Secondary | ICD-10-CM | POA: Diagnosis not present

## 2023-03-03 DIAGNOSIS — Z794 Long term (current) use of insulin: Secondary | ICD-10-CM | POA: Diagnosis not present

## 2023-03-05 DIAGNOSIS — E1122 Type 2 diabetes mellitus with diabetic chronic kidney disease: Secondary | ICD-10-CM | POA: Diagnosis not present

## 2023-03-05 DIAGNOSIS — Z87440 Personal history of urinary (tract) infections: Secondary | ICD-10-CM | POA: Diagnosis not present

## 2023-03-05 DIAGNOSIS — Z87891 Personal history of nicotine dependence: Secondary | ICD-10-CM | POA: Diagnosis not present

## 2023-03-05 DIAGNOSIS — D509 Iron deficiency anemia, unspecified: Secondary | ICD-10-CM | POA: Diagnosis not present

## 2023-03-05 DIAGNOSIS — Z992 Dependence on renal dialysis: Secondary | ICD-10-CM | POA: Diagnosis not present

## 2023-03-05 DIAGNOSIS — Z794 Long term (current) use of insulin: Secondary | ICD-10-CM | POA: Diagnosis not present

## 2023-03-05 DIAGNOSIS — I132 Hypertensive heart and chronic kidney disease with heart failure and with stage 5 chronic kidney disease, or end stage renal disease: Secondary | ICD-10-CM | POA: Diagnosis not present

## 2023-03-05 DIAGNOSIS — N186 End stage renal disease: Secondary | ICD-10-CM | POA: Diagnosis not present

## 2023-03-05 DIAGNOSIS — I509 Heart failure, unspecified: Secondary | ICD-10-CM | POA: Diagnosis not present

## 2023-03-06 DIAGNOSIS — D509 Iron deficiency anemia, unspecified: Secondary | ICD-10-CM | POA: Diagnosis not present

## 2023-03-06 DIAGNOSIS — Z992 Dependence on renal dialysis: Secondary | ICD-10-CM | POA: Diagnosis not present

## 2023-03-06 DIAGNOSIS — E1122 Type 2 diabetes mellitus with diabetic chronic kidney disease: Secondary | ICD-10-CM | POA: Diagnosis not present

## 2023-03-06 DIAGNOSIS — Z87891 Personal history of nicotine dependence: Secondary | ICD-10-CM | POA: Diagnosis not present

## 2023-03-06 DIAGNOSIS — Z794 Long term (current) use of insulin: Secondary | ICD-10-CM | POA: Diagnosis not present

## 2023-03-06 DIAGNOSIS — Z87440 Personal history of urinary (tract) infections: Secondary | ICD-10-CM | POA: Diagnosis not present

## 2023-03-06 DIAGNOSIS — I509 Heart failure, unspecified: Secondary | ICD-10-CM | POA: Diagnosis not present

## 2023-03-06 DIAGNOSIS — N186 End stage renal disease: Secondary | ICD-10-CM | POA: Diagnosis not present

## 2023-03-06 DIAGNOSIS — I132 Hypertensive heart and chronic kidney disease with heart failure and with stage 5 chronic kidney disease, or end stage renal disease: Secondary | ICD-10-CM | POA: Diagnosis not present

## 2023-03-09 DIAGNOSIS — Z87891 Personal history of nicotine dependence: Secondary | ICD-10-CM | POA: Diagnosis not present

## 2023-03-09 DIAGNOSIS — N186 End stage renal disease: Secondary | ICD-10-CM | POA: Diagnosis not present

## 2023-03-09 DIAGNOSIS — D509 Iron deficiency anemia, unspecified: Secondary | ICD-10-CM | POA: Diagnosis not present

## 2023-03-09 DIAGNOSIS — E1122 Type 2 diabetes mellitus with diabetic chronic kidney disease: Secondary | ICD-10-CM | POA: Diagnosis not present

## 2023-03-09 DIAGNOSIS — I509 Heart failure, unspecified: Secondary | ICD-10-CM | POA: Diagnosis not present

## 2023-03-09 DIAGNOSIS — Z992 Dependence on renal dialysis: Secondary | ICD-10-CM | POA: Diagnosis not present

## 2023-03-09 DIAGNOSIS — Z87440 Personal history of urinary (tract) infections: Secondary | ICD-10-CM | POA: Diagnosis not present

## 2023-03-09 DIAGNOSIS — I132 Hypertensive heart and chronic kidney disease with heart failure and with stage 5 chronic kidney disease, or end stage renal disease: Secondary | ICD-10-CM | POA: Diagnosis not present

## 2023-03-09 DIAGNOSIS — Z794 Long term (current) use of insulin: Secondary | ICD-10-CM | POA: Diagnosis not present

## 2023-03-10 DIAGNOSIS — Z87891 Personal history of nicotine dependence: Secondary | ICD-10-CM | POA: Diagnosis not present

## 2023-03-10 DIAGNOSIS — D509 Iron deficiency anemia, unspecified: Secondary | ICD-10-CM | POA: Diagnosis not present

## 2023-03-10 DIAGNOSIS — I509 Heart failure, unspecified: Secondary | ICD-10-CM | POA: Diagnosis not present

## 2023-03-10 DIAGNOSIS — Z87440 Personal history of urinary (tract) infections: Secondary | ICD-10-CM | POA: Diagnosis not present

## 2023-03-10 DIAGNOSIS — I132 Hypertensive heart and chronic kidney disease with heart failure and with stage 5 chronic kidney disease, or end stage renal disease: Secondary | ICD-10-CM | POA: Diagnosis not present

## 2023-03-10 DIAGNOSIS — Z794 Long term (current) use of insulin: Secondary | ICD-10-CM | POA: Diagnosis not present

## 2023-03-10 DIAGNOSIS — E1122 Type 2 diabetes mellitus with diabetic chronic kidney disease: Secondary | ICD-10-CM | POA: Diagnosis not present

## 2023-03-10 DIAGNOSIS — N186 End stage renal disease: Secondary | ICD-10-CM | POA: Diagnosis not present

## 2023-03-10 DIAGNOSIS — Z992 Dependence on renal dialysis: Secondary | ICD-10-CM | POA: Diagnosis not present

## 2023-03-10 NOTE — Telephone Encounter (Signed)
Appt has been scheduled.

## 2023-03-12 ENCOUNTER — Encounter (HOSPITAL_BASED_OUTPATIENT_CLINIC_OR_DEPARTMENT_OTHER): Payer: Self-pay

## 2023-03-12 DIAGNOSIS — Z992 Dependence on renal dialysis: Secondary | ICD-10-CM | POA: Diagnosis not present

## 2023-03-12 DIAGNOSIS — Z794 Long term (current) use of insulin: Secondary | ICD-10-CM | POA: Diagnosis not present

## 2023-03-12 DIAGNOSIS — N186 End stage renal disease: Secondary | ICD-10-CM | POA: Diagnosis not present

## 2023-03-12 DIAGNOSIS — Z87440 Personal history of urinary (tract) infections: Secondary | ICD-10-CM | POA: Diagnosis not present

## 2023-03-12 DIAGNOSIS — Z87891 Personal history of nicotine dependence: Secondary | ICD-10-CM | POA: Diagnosis not present

## 2023-03-12 DIAGNOSIS — D509 Iron deficiency anemia, unspecified: Secondary | ICD-10-CM | POA: Diagnosis not present

## 2023-03-12 DIAGNOSIS — E1122 Type 2 diabetes mellitus with diabetic chronic kidney disease: Secondary | ICD-10-CM | POA: Diagnosis not present

## 2023-03-12 DIAGNOSIS — I509 Heart failure, unspecified: Secondary | ICD-10-CM | POA: Diagnosis not present

## 2023-03-12 DIAGNOSIS — I132 Hypertensive heart and chronic kidney disease with heart failure and with stage 5 chronic kidney disease, or end stage renal disease: Secondary | ICD-10-CM | POA: Diagnosis not present

## 2023-03-13 DIAGNOSIS — Z992 Dependence on renal dialysis: Secondary | ICD-10-CM | POA: Diagnosis not present

## 2023-03-13 DIAGNOSIS — D509 Iron deficiency anemia, unspecified: Secondary | ICD-10-CM | POA: Diagnosis not present

## 2023-03-13 DIAGNOSIS — E1122 Type 2 diabetes mellitus with diabetic chronic kidney disease: Secondary | ICD-10-CM | POA: Diagnosis not present

## 2023-03-13 DIAGNOSIS — I509 Heart failure, unspecified: Secondary | ICD-10-CM | POA: Diagnosis not present

## 2023-03-13 DIAGNOSIS — N186 End stage renal disease: Secondary | ICD-10-CM | POA: Diagnosis not present

## 2023-03-13 DIAGNOSIS — Z87440 Personal history of urinary (tract) infections: Secondary | ICD-10-CM | POA: Diagnosis not present

## 2023-03-13 DIAGNOSIS — I132 Hypertensive heart and chronic kidney disease with heart failure and with stage 5 chronic kidney disease, or end stage renal disease: Secondary | ICD-10-CM | POA: Diagnosis not present

## 2023-03-13 DIAGNOSIS — Z87891 Personal history of nicotine dependence: Secondary | ICD-10-CM | POA: Diagnosis not present

## 2023-03-13 DIAGNOSIS — Z794 Long term (current) use of insulin: Secondary | ICD-10-CM | POA: Diagnosis not present

## 2023-03-16 DIAGNOSIS — E1122 Type 2 diabetes mellitus with diabetic chronic kidney disease: Secondary | ICD-10-CM | POA: Diagnosis not present

## 2023-03-16 DIAGNOSIS — Z794 Long term (current) use of insulin: Secondary | ICD-10-CM | POA: Diagnosis not present

## 2023-03-16 DIAGNOSIS — Z87440 Personal history of urinary (tract) infections: Secondary | ICD-10-CM | POA: Diagnosis not present

## 2023-03-16 DIAGNOSIS — D509 Iron deficiency anemia, unspecified: Secondary | ICD-10-CM | POA: Diagnosis not present

## 2023-03-16 DIAGNOSIS — I132 Hypertensive heart and chronic kidney disease with heart failure and with stage 5 chronic kidney disease, or end stage renal disease: Secondary | ICD-10-CM | POA: Diagnosis not present

## 2023-03-16 DIAGNOSIS — I509 Heart failure, unspecified: Secondary | ICD-10-CM | POA: Diagnosis not present

## 2023-03-16 DIAGNOSIS — Z992 Dependence on renal dialysis: Secondary | ICD-10-CM | POA: Diagnosis not present

## 2023-03-16 DIAGNOSIS — N186 End stage renal disease: Secondary | ICD-10-CM | POA: Diagnosis not present

## 2023-03-16 DIAGNOSIS — Z87891 Personal history of nicotine dependence: Secondary | ICD-10-CM | POA: Diagnosis not present

## 2023-03-17 ENCOUNTER — Institutional Professional Consult (permissible substitution) (HOSPITAL_BASED_OUTPATIENT_CLINIC_OR_DEPARTMENT_OTHER): Payer: Commercial Managed Care - HMO | Admitting: Cardiovascular Disease

## 2023-03-17 DIAGNOSIS — Z794 Long term (current) use of insulin: Secondary | ICD-10-CM | POA: Diagnosis not present

## 2023-03-17 DIAGNOSIS — D509 Iron deficiency anemia, unspecified: Secondary | ICD-10-CM | POA: Diagnosis not present

## 2023-03-17 DIAGNOSIS — Z87891 Personal history of nicotine dependence: Secondary | ICD-10-CM | POA: Diagnosis not present

## 2023-03-17 DIAGNOSIS — Z992 Dependence on renal dialysis: Secondary | ICD-10-CM | POA: Diagnosis not present

## 2023-03-17 DIAGNOSIS — Z87440 Personal history of urinary (tract) infections: Secondary | ICD-10-CM | POA: Diagnosis not present

## 2023-03-17 DIAGNOSIS — N186 End stage renal disease: Secondary | ICD-10-CM | POA: Diagnosis not present

## 2023-03-17 DIAGNOSIS — I509 Heart failure, unspecified: Secondary | ICD-10-CM | POA: Diagnosis not present

## 2023-03-17 DIAGNOSIS — I129 Hypertensive chronic kidney disease with stage 1 through stage 4 chronic kidney disease, or unspecified chronic kidney disease: Secondary | ICD-10-CM | POA: Diagnosis not present

## 2023-03-17 DIAGNOSIS — I132 Hypertensive heart and chronic kidney disease with heart failure and with stage 5 chronic kidney disease, or end stage renal disease: Secondary | ICD-10-CM | POA: Diagnosis not present

## 2023-03-17 DIAGNOSIS — E1122 Type 2 diabetes mellitus with diabetic chronic kidney disease: Secondary | ICD-10-CM | POA: Diagnosis not present

## 2023-03-17 NOTE — Progress Notes (Deleted)
Advanced Hypertension Clinic Initial Assessment:    Date:  03/17/2023   ID:  Tricia Huynh, DOB 1956/05/29, MRN [id number]  PCP:  Tricia Palmer, MD  Cardiologist:  None  Nephrologist:  Referring MD: Tricia Palmer, MD   CC: Hypertension  History of Present Illness:    Tricia Huynh is a 67 y.o. female with a hx of hypertension, HFpEF, diabetes mellitus type 2, seizures, ESRD on HD, and hyperlipidemia here to establish care in the Advanced Hypertension Clinic.   She saw her PCP 10/2022 after hospitalization in 09/2022 with hypertensive emergency, seizures, and encephalopathy.  At the time of her appointment she was on hydralazine, carvedilol, and amlodipine.  Blood pressure in the office was 215/119.  Her uncontrolled hypertension was thought to be due to poor adherence to medications.  She ultimately developed end-stage renal disease and started on dialysis 01/2023.  She had a fistula placed 02/12/2023 and has been getting outpatient dialysis.   Previous antihypertensives: HCTZ    Past Medical History:  Diagnosis Date   Anemia of chronic kidney failure, stage 4 (severe) (HCC)    Diabetes mellitus type 2 in nonobese (HCC) 06/06/2015   Diabetes mellitus without complication (HCC)    Diabetic neuropathy (HCC) 06/06/2015   Dyslipidemia 06/06/2015   Encephalopathy    Essential hypertension 06/06/2015   Hypertension    Mood disorder (HCC) 06/06/2015   Pneumonia 03/11/2017   Seizure (HCC)    sees [facility]. abd eeg, on keppra    Past Surgical History:  Procedure Laterality Date   ABDOMINAL HYSTERECTOMY     AV FISTULA PLACEMENT Left 02/12/2023   Procedure: LEFT ARM BRACHIOCEPHALIC ARTERIOVENOUS (AV) FISTULA CREATION;  Surgeon: Victorino Sparrow, MD;  Location: [facility] OR;  Service: Vascular;  Laterality: Left;   EYE SURGERY Bilateral    IR FLUORO GUIDE CV LINE RIGHT  02/06/2023   IR US GUIDE VASC ACCESS RIGHT  02/06/2023   TUBAL LIGATION      Current Medications: No  outpatient medications have been marked as taking for the 03/17/23 encounter (Appointment) with Chilton Si, MD.     Allergies:   Percocet [oxycodone-acetaminophen] and Vicodin [hydrocodone-acetaminophen]   Social History   Socioeconomic History   Marital status: Single    Spouse name: Not on file   Number of children: 3   Years of education: 10   Highest education level: Not on file  Occupational History   Occupation: Disabled  Tobacco Use   Smoking status: Former    Types: Cigarettes   Smokeless tobacco: Never  Vaping Use   Vaping Use: Never used  Substance and Sexual Activity   Alcohol use: No   Drug use: No   Sexual activity: Yes  Other Topics Concern   Not on file  Social History Narrative   Lives at home with her grandchildren.   Right-handed.   No caffeine use.   Social Determinants of Health   Financial Resource Strain: Not on file  Food Insecurity: No Food Insecurity (02/02/2023)   Hunger Vital Sign    Worried About Running Out of Food in the Last Year: Never true    Ran Out of Food in the Last Year: Never true  Transportation Needs: No Transportation Needs (02/02/2023)   PRAPARE - Administrator, Civil Service (Medical): No    Lack of Transportation (Non-Medical): No  Physical Activity: Not on file  Stress: Not on file  Social Connections: Not on file     Family History: The patient's ***family  history includes Diabetes in her mother; Heart disease in her father; Kidney disease in her father; Seizures in her brother and sister.  ROS:   Please see the history of present illness.    *** All other systems reviewed and are negative.  EKGs/Labs/Other Studies Reviewed:    EKG:  EKG is *** ordered today.  The ekg ordered today demonstrates ***  Recent Labs: 09/18/2022: TSH 2.409 02/03/2023: ALT 12 02/09/2023: Magnesium 1.6 02/12/2023: B Natriuretic Peptide 132.5 02/13/2023: BUN 26; Creatinine, Ser 5.16; Hemoglobin 7.2; Platelets 219;  Potassium 3.8; Sodium 133   Recent Lipid Panel    Component Value Date/Time   CHOL 226 (H) 04/28/2022 0321   TRIG 134 04/28/2022 0321   HDL 53 04/28/2022 0321   CHOLHDL 4.3 04/28/2022 0321   VLDL 27 04/28/2022 0321   LDLCALC 146 (H) 04/28/2022 0321    Physical Exam:   VS:  There were no vitals taken for this visit. , BMI There is no height or weight on file to calculate BMI. GENERAL:  Well appearing HEENT: Pupils equal round and reactive, fundi not visualized, oral mucosa unremarkable NECK:  No jugular venous distention, waveform within normal limits, carotid upstroke brisk and symmetric, no bruits, no thyromegaly LYMPHATICS:  No cervical adenopathy LUNGS:  Clear to auscultation bilaterally HEART:  RRR.  PMI not displaced or sustained,S1 and S2 within normal limits, no S3, no S4, no clicks, no rubs, *** murmurs ABD:  Flat, positive bowel sounds normal in frequency in pitch, no bruits, no rebound, no guarding, no midline pulsatile mass, no hepatomegaly, no splenomegaly EXT:  2 plus pulses throughout, no edema, no cyanosis no clubbing SKIN:  No rashes no nodules NEURO:  Cranial nerves II through XII grossly intact, motor grossly intact throughout PSYCH:  Cognitively intact, oriented to person place and time   ASSESSMENT/PLAN:    No problem-specific Assessment & Plan notes found for this encounter.   Screening for Secondary Hypertension: { Click here to document screening for secondary causes of HTN  :[id number]}    Relevant Labs/Studies:    Latest Ref Rng & Units 02/13/2023    8:54 AM 02/12/2023    9:19 PM 02/11/2023    8:10 AM  Basic Labs  Sodium 135 - 145 mmol/L 133  130  135   Potassium 3.5 - 5.1 mmol/L 3.8  4.0  3.7   Creatinine 0.44 - 1.00 mg/dL 4.09  8.11  9.14        Latest Ref Rng & Units 09/18/2022   10:27 AM 04/27/2022    9:37 PM  Thyroid   TSH 0.350 - 4.500 uIU/mL 2.409  1.209                 09/19/2022    9:45 AM  Renovascular   Renal Artery [place]  Completed Yes       Disposition:    FU with MD/PharmD in {gen number [id number]} {Days to years:10300}    Medication Adjustments/Labs and Tests Ordered: Current medicines are reviewed at length with the patient today.  Concerns regarding medicines are outlined above.  No orders of the defined types were placed in this encounter.  No orders of the defined types were placed in this encounter.    Signed, Chilton Si, MD  03/17/2023 8:57 AM    [facility]

## 2023-03-19 DIAGNOSIS — I132 Hypertensive heart and chronic kidney disease with heart failure and with stage 5 chronic kidney disease, or end stage renal disease: Secondary | ICD-10-CM | POA: Diagnosis not present

## 2023-03-19 DIAGNOSIS — Z794 Long term (current) use of insulin: Secondary | ICD-10-CM | POA: Diagnosis not present

## 2023-03-19 DIAGNOSIS — E1122 Type 2 diabetes mellitus with diabetic chronic kidney disease: Secondary | ICD-10-CM | POA: Diagnosis not present

## 2023-03-19 DIAGNOSIS — Z992 Dependence on renal dialysis: Secondary | ICD-10-CM | POA: Diagnosis not present

## 2023-03-19 DIAGNOSIS — N186 End stage renal disease: Secondary | ICD-10-CM | POA: Diagnosis not present

## 2023-03-19 DIAGNOSIS — I509 Heart failure, unspecified: Secondary | ICD-10-CM | POA: Diagnosis not present

## 2023-03-19 DIAGNOSIS — Z87891 Personal history of nicotine dependence: Secondary | ICD-10-CM | POA: Diagnosis not present

## 2023-03-20 DIAGNOSIS — I509 Heart failure, unspecified: Secondary | ICD-10-CM | POA: Diagnosis not present

## 2023-03-20 DIAGNOSIS — I132 Hypertensive heart and chronic kidney disease with heart failure and with stage 5 chronic kidney disease, or end stage renal disease: Secondary | ICD-10-CM | POA: Diagnosis not present

## 2023-03-20 DIAGNOSIS — Z87891 Personal history of nicotine dependence: Secondary | ICD-10-CM | POA: Diagnosis not present

## 2023-03-20 DIAGNOSIS — Z992 Dependence on renal dialysis: Secondary | ICD-10-CM | POA: Diagnosis not present

## 2023-03-20 DIAGNOSIS — E1122 Type 2 diabetes mellitus with diabetic chronic kidney disease: Secondary | ICD-10-CM | POA: Diagnosis not present

## 2023-03-20 DIAGNOSIS — Z794 Long term (current) use of insulin: Secondary | ICD-10-CM | POA: Diagnosis not present

## 2023-03-20 DIAGNOSIS — N186 End stage renal disease: Secondary | ICD-10-CM | POA: Diagnosis not present

## 2023-03-23 ENCOUNTER — Encounter (HOSPITAL_BASED_OUTPATIENT_CLINIC_OR_DEPARTMENT_OTHER): Payer: Self-pay | Admitting: Cardiovascular Disease

## 2023-03-23 DIAGNOSIS — N186 End stage renal disease: Secondary | ICD-10-CM | POA: Diagnosis not present

## 2023-03-23 DIAGNOSIS — Z794 Long term (current) use of insulin: Secondary | ICD-10-CM | POA: Diagnosis not present

## 2023-03-23 DIAGNOSIS — E1122 Type 2 diabetes mellitus with diabetic chronic kidney disease: Secondary | ICD-10-CM | POA: Diagnosis not present

## 2023-03-23 DIAGNOSIS — Z87891 Personal history of nicotine dependence: Secondary | ICD-10-CM | POA: Diagnosis not present

## 2023-03-23 DIAGNOSIS — Z992 Dependence on renal dialysis: Secondary | ICD-10-CM | POA: Diagnosis not present

## 2023-03-23 DIAGNOSIS — I132 Hypertensive heart and chronic kidney disease with heart failure and with stage 5 chronic kidney disease, or end stage renal disease: Secondary | ICD-10-CM | POA: Diagnosis not present

## 2023-03-23 DIAGNOSIS — I509 Heart failure, unspecified: Secondary | ICD-10-CM | POA: Diagnosis not present

## 2023-03-24 DIAGNOSIS — I132 Hypertensive heart and chronic kidney disease with heart failure and with stage 5 chronic kidney disease, or end stage renal disease: Secondary | ICD-10-CM | POA: Diagnosis not present

## 2023-03-24 DIAGNOSIS — Z87891 Personal history of nicotine dependence: Secondary | ICD-10-CM | POA: Diagnosis not present

## 2023-03-24 DIAGNOSIS — E1122 Type 2 diabetes mellitus with diabetic chronic kidney disease: Secondary | ICD-10-CM | POA: Diagnosis not present

## 2023-03-24 DIAGNOSIS — Z992 Dependence on renal dialysis: Secondary | ICD-10-CM | POA: Diagnosis not present

## 2023-03-24 DIAGNOSIS — Z794 Long term (current) use of insulin: Secondary | ICD-10-CM | POA: Diagnosis not present

## 2023-03-24 DIAGNOSIS — N186 End stage renal disease: Secondary | ICD-10-CM | POA: Diagnosis not present

## 2023-03-24 DIAGNOSIS — I509 Heart failure, unspecified: Secondary | ICD-10-CM | POA: Diagnosis not present

## 2023-03-25 ENCOUNTER — Other Ambulatory Visit: Payer: Self-pay | Admitting: *Deleted

## 2023-03-25 DIAGNOSIS — I700 Atherosclerosis of aorta: Secondary | ICD-10-CM | POA: Diagnosis not present

## 2023-03-25 DIAGNOSIS — Z79899 Other long term (current) drug therapy: Secondary | ICD-10-CM | POA: Diagnosis not present

## 2023-03-25 DIAGNOSIS — E46 Unspecified protein-calorie malnutrition: Secondary | ICD-10-CM | POA: Diagnosis not present

## 2023-03-25 DIAGNOSIS — N2581 Secondary hyperparathyroidism of renal origin: Secondary | ICD-10-CM | POA: Diagnosis not present

## 2023-03-25 DIAGNOSIS — Z992 Dependence on renal dialysis: Secondary | ICD-10-CM | POA: Diagnosis not present

## 2023-03-25 DIAGNOSIS — N183 Chronic kidney disease, stage 3 unspecified: Secondary | ICD-10-CM

## 2023-03-25 DIAGNOSIS — E1142 Type 2 diabetes mellitus with diabetic polyneuropathy: Secondary | ICD-10-CM | POA: Diagnosis not present

## 2023-03-25 DIAGNOSIS — R569 Unspecified convulsions: Secondary | ICD-10-CM | POA: Diagnosis not present

## 2023-03-25 DIAGNOSIS — E1122 Type 2 diabetes mellitus with diabetic chronic kidney disease: Secondary | ICD-10-CM | POA: Diagnosis not present

## 2023-03-25 DIAGNOSIS — I120 Hypertensive chronic kidney disease with stage 5 chronic kidney disease or end stage renal disease: Secondary | ICD-10-CM | POA: Diagnosis not present

## 2023-03-25 DIAGNOSIS — N186 End stage renal disease: Secondary | ICD-10-CM | POA: Diagnosis not present

## 2023-03-26 DIAGNOSIS — N186 End stage renal disease: Secondary | ICD-10-CM | POA: Diagnosis not present

## 2023-03-26 DIAGNOSIS — I509 Heart failure, unspecified: Secondary | ICD-10-CM | POA: Diagnosis not present

## 2023-03-26 DIAGNOSIS — Z794 Long term (current) use of insulin: Secondary | ICD-10-CM | POA: Diagnosis not present

## 2023-03-26 DIAGNOSIS — Z87891 Personal history of nicotine dependence: Secondary | ICD-10-CM | POA: Diagnosis not present

## 2023-03-26 DIAGNOSIS — E1122 Type 2 diabetes mellitus with diabetic chronic kidney disease: Secondary | ICD-10-CM | POA: Diagnosis not present

## 2023-03-26 DIAGNOSIS — Z992 Dependence on renal dialysis: Secondary | ICD-10-CM | POA: Diagnosis not present

## 2023-03-26 DIAGNOSIS — I132 Hypertensive heart and chronic kidney disease with heart failure and with stage 5 chronic kidney disease, or end stage renal disease: Secondary | ICD-10-CM | POA: Diagnosis not present

## 2023-03-27 ENCOUNTER — Encounter (HOSPITAL_COMMUNITY): Payer: Self-pay

## 2023-03-27 DIAGNOSIS — I509 Heart failure, unspecified: Secondary | ICD-10-CM | POA: Diagnosis not present

## 2023-03-27 DIAGNOSIS — I132 Hypertensive heart and chronic kidney disease with heart failure and with stage 5 chronic kidney disease, or end stage renal disease: Secondary | ICD-10-CM | POA: Diagnosis not present

## 2023-03-27 DIAGNOSIS — Z87891 Personal history of nicotine dependence: Secondary | ICD-10-CM | POA: Diagnosis not present

## 2023-03-27 DIAGNOSIS — Z992 Dependence on renal dialysis: Secondary | ICD-10-CM | POA: Diagnosis not present

## 2023-03-27 DIAGNOSIS — N186 End stage renal disease: Secondary | ICD-10-CM | POA: Diagnosis not present

## 2023-03-27 DIAGNOSIS — E1122 Type 2 diabetes mellitus with diabetic chronic kidney disease: Secondary | ICD-10-CM | POA: Diagnosis not present

## 2023-03-27 DIAGNOSIS — Z794 Long term (current) use of insulin: Secondary | ICD-10-CM | POA: Diagnosis not present

## 2023-03-30 DIAGNOSIS — N186 End stage renal disease: Secondary | ICD-10-CM | POA: Diagnosis not present

## 2023-03-30 DIAGNOSIS — Z87891 Personal history of nicotine dependence: Secondary | ICD-10-CM | POA: Diagnosis not present

## 2023-03-30 DIAGNOSIS — E785 Hyperlipidemia, unspecified: Secondary | ICD-10-CM | POA: Diagnosis not present

## 2023-03-30 DIAGNOSIS — N184 Chronic kidney disease, stage 4 (severe): Secondary | ICD-10-CM | POA: Diagnosis not present

## 2023-03-30 DIAGNOSIS — Z794 Long term (current) use of insulin: Secondary | ICD-10-CM | POA: Diagnosis not present

## 2023-03-30 DIAGNOSIS — I132 Hypertensive heart and chronic kidney disease with heart failure and with stage 5 chronic kidney disease, or end stage renal disease: Secondary | ICD-10-CM | POA: Diagnosis not present

## 2023-03-30 DIAGNOSIS — E1122 Type 2 diabetes mellitus with diabetic chronic kidney disease: Secondary | ICD-10-CM | POA: Diagnosis not present

## 2023-03-30 DIAGNOSIS — I10 Essential (primary) hypertension: Secondary | ICD-10-CM | POA: Diagnosis not present

## 2023-03-30 DIAGNOSIS — I509 Heart failure, unspecified: Secondary | ICD-10-CM | POA: Diagnosis not present

## 2023-03-30 DIAGNOSIS — E1142 Type 2 diabetes mellitus with diabetic polyneuropathy: Secondary | ICD-10-CM | POA: Diagnosis not present

## 2023-03-30 DIAGNOSIS — Z992 Dependence on renal dialysis: Secondary | ICD-10-CM | POA: Diagnosis not present

## 2023-03-31 DIAGNOSIS — I132 Hypertensive heart and chronic kidney disease with heart failure and with stage 5 chronic kidney disease, or end stage renal disease: Secondary | ICD-10-CM | POA: Diagnosis not present

## 2023-03-31 DIAGNOSIS — I509 Heart failure, unspecified: Secondary | ICD-10-CM | POA: Diagnosis not present

## 2023-03-31 DIAGNOSIS — N186 End stage renal disease: Secondary | ICD-10-CM | POA: Diagnosis not present

## 2023-03-31 DIAGNOSIS — Z87891 Personal history of nicotine dependence: Secondary | ICD-10-CM | POA: Diagnosis not present

## 2023-03-31 DIAGNOSIS — Z794 Long term (current) use of insulin: Secondary | ICD-10-CM | POA: Diagnosis not present

## 2023-03-31 DIAGNOSIS — Z992 Dependence on renal dialysis: Secondary | ICD-10-CM | POA: Diagnosis not present

## 2023-03-31 DIAGNOSIS — E1122 Type 2 diabetes mellitus with diabetic chronic kidney disease: Secondary | ICD-10-CM | POA: Diagnosis not present

## 2023-04-02 DIAGNOSIS — Z794 Long term (current) use of insulin: Secondary | ICD-10-CM | POA: Diagnosis not present

## 2023-04-02 DIAGNOSIS — Z992 Dependence on renal dialysis: Secondary | ICD-10-CM | POA: Diagnosis not present

## 2023-04-02 DIAGNOSIS — N186 End stage renal disease: Secondary | ICD-10-CM | POA: Diagnosis not present

## 2023-04-02 DIAGNOSIS — Z87891 Personal history of nicotine dependence: Secondary | ICD-10-CM | POA: Diagnosis not present

## 2023-04-02 DIAGNOSIS — I132 Hypertensive heart and chronic kidney disease with heart failure and with stage 5 chronic kidney disease, or end stage renal disease: Secondary | ICD-10-CM | POA: Diagnosis not present

## 2023-04-02 DIAGNOSIS — E1122 Type 2 diabetes mellitus with diabetic chronic kidney disease: Secondary | ICD-10-CM | POA: Diagnosis not present

## 2023-04-02 DIAGNOSIS — I509 Heart failure, unspecified: Secondary | ICD-10-CM | POA: Diagnosis not present

## 2023-04-03 ENCOUNTER — Encounter (HOSPITAL_COMMUNITY): Payer: Self-pay

## 2023-04-03 ENCOUNTER — Ambulatory Visit (INDEPENDENT_AMBULATORY_CARE_PROVIDER_SITE_OTHER): Payer: 59 | Admitting: Physician Assistant

## 2023-04-03 ENCOUNTER — Ambulatory Visit (HOSPITAL_COMMUNITY)
Admission: RE | Admit: 2023-04-03 | Discharge: 2023-04-03 | Disposition: A | Discharge status: Home / Self Care | Payer: 59 | Source: Ambulatory Visit | Attending: Vascular Surgery | Admitting: Vascular Surgery

## 2023-04-03 VITALS — BP 160/80 | HR 95 | Temp 98.2°F | Resp 16 | Ht 68.0 in | Wt 110.0 lb

## 2023-04-03 DIAGNOSIS — Z992 Dependence on renal dialysis: Secondary | ICD-10-CM

## 2023-04-03 DIAGNOSIS — N186 End stage renal disease: Secondary | ICD-10-CM | POA: Diagnosis not present

## 2023-04-03 DIAGNOSIS — E1122 Type 2 diabetes mellitus with diabetic chronic kidney disease: Secondary | ICD-10-CM | POA: Diagnosis not present

## 2023-04-03 DIAGNOSIS — I132 Hypertensive heart and chronic kidney disease with heart failure and with stage 5 chronic kidney disease, or end stage renal disease: Secondary | ICD-10-CM | POA: Diagnosis not present

## 2023-04-03 DIAGNOSIS — Z794 Long term (current) use of insulin: Secondary | ICD-10-CM | POA: Diagnosis not present

## 2023-04-03 DIAGNOSIS — N183 Chronic kidney disease, stage 3 unspecified: Secondary | ICD-10-CM | POA: Diagnosis not present

## 2023-04-03 DIAGNOSIS — I509 Heart failure, unspecified: Secondary | ICD-10-CM | POA: Diagnosis not present

## 2023-04-03 DIAGNOSIS — Z87891 Personal history of nicotine dependence: Secondary | ICD-10-CM | POA: Diagnosis not present

## 2023-04-03 NOTE — Progress Notes (Signed)
Postoperative Access Visit   History of Present Illness   Tricia Huynh is a 67 y.o. year old female who presents for postoperative follow-up for: Left arm brachiocephalic AV fistula by Dr. Karin Lieu on 02/12/23.  The patient's wounds are well healed.  The patient notes no steal symptoms.  The patient is able to complete their activities of daily living.    She currently dialyzes via right IJ TDC on Mon/Tues/Thurs/Fri at the [facility] location  Physical Examination   Vitals:   04/03/23 1346  BP: (!) 160/80  Pulse: 95  Resp: 16  Temp: 98.2 F (36.8 C)  TempSrc: Temporal  SpO2: 99%  Weight: 110 lb (49.9 kg)  Height: 5\' 8"  (1.727 m)   Body mass index is 16.73 kg/m.  left arm Incision is well healed, 2+ radial pulse, hand grip is 5/5, sensation in digits is intact, palpable thrill, bruit can be auscultated. Fistula is easily palpable in left upper extremity    Non invasive studies: Findings:  +--------------------+----------+-----------------+--------+  AVF                PSV (cm/s)Flow Vol (mL/min)Comments  +--------------------+----------+-----------------+--------+  Native artery inflow   249           679                 +--------------------+----------+-----------------+--------+  AVF Anastomosis        873                               +--------------------+----------+-----------------+--------+     +------------+----------+-------------+----------+-------------------------  ----+  OUTFLOW VEINPSV (cm/s)Diameter (cm)Depth (cm)          Describe              +------------+----------+-------------+----------+-------------------------  ----+  Shoulder      142        0.67        0.27                                   +------------+----------+-------------+----------+-------------------------  ----+  Prox UA        108        0.65        0.16   multiple competing  branches,                                                   largest measuring  0.36cm     +------------+----------+-------------+----------+-------------------------  ----+  Mid UA         118        0.72        0.15                                   +------------+----------+-------------+----------+-------------------------  ----+  Dist UA        116        0.81        0.17    competing branch  measuring  0.29cm               +------------+----------+-------------+----------+-------------------------  ----+  AC Fossa       545        0.68        0.13                                   +------------+----------+-------------+----------+-------------------------  ----+  Prox Forearm   868        0.24        0.24                                   +------------+----------+-------------+----------+-------------------------  ----+    Summary:  Patent arteriovenous fistula. Arteriovenous fistula-Stenosis noted in the outflow vein at the proximal forearm. Multiple competing branches, largest measuring 0.36cm.   Medical Decision Making   Tricia Huynh is a 67 y.o. year old female who presents s/p Left arm brachiocephalic AV fistula by Dr. Karin Lieu on 02/12/23. Her incision has healed very nicely. Her fistula is patent and easily palpable in left upper extremity. Duplex shows adequate volume flow and fistula has matured nicely. Some side branches noted. Theses are not visible on exam. I do not think these will cause any issues with cannulation. Encouraged her to exercise her left arm  Patent is without signs or symptoms of steal syndrome The patient's access will be ready for use after 05/15/23 The patient's tunneled dialysis catheter can be removed when Nephrology is comfortable with the performance of the left AV fistula. Her TDC was placed by IR who can be contacted for removal The patient may follow up on a prn basis   Tricia Congress, PA-C [facility] [place] Office: [phone]  On call MD: Karin Lieu

## 2023-04-07 DIAGNOSIS — I132 Hypertensive heart and chronic kidney disease with heart failure and with stage 5 chronic kidney disease, or end stage renal disease: Secondary | ICD-10-CM | POA: Diagnosis not present

## 2023-04-07 DIAGNOSIS — Z992 Dependence on renal dialysis: Secondary | ICD-10-CM | POA: Diagnosis not present

## 2023-04-07 DIAGNOSIS — E1122 Type 2 diabetes mellitus with diabetic chronic kidney disease: Secondary | ICD-10-CM | POA: Diagnosis not present

## 2023-04-07 DIAGNOSIS — N186 End stage renal disease: Secondary | ICD-10-CM | POA: Diagnosis not present

## 2023-04-07 DIAGNOSIS — Z87891 Personal history of nicotine dependence: Secondary | ICD-10-CM | POA: Diagnosis not present

## 2023-04-07 DIAGNOSIS — I509 Heart failure, unspecified: Secondary | ICD-10-CM | POA: Diagnosis not present

## 2023-04-07 DIAGNOSIS — Z794 Long term (current) use of insulin: Secondary | ICD-10-CM | POA: Diagnosis not present

## 2023-04-09 DIAGNOSIS — E1122 Type 2 diabetes mellitus with diabetic chronic kidney disease: Secondary | ICD-10-CM | POA: Diagnosis not present

## 2023-04-09 DIAGNOSIS — Z794 Long term (current) use of insulin: Secondary | ICD-10-CM | POA: Diagnosis not present

## 2023-04-09 DIAGNOSIS — Z992 Dependence on renal dialysis: Secondary | ICD-10-CM | POA: Diagnosis not present

## 2023-04-09 DIAGNOSIS — N186 End stage renal disease: Secondary | ICD-10-CM | POA: Diagnosis not present

## 2023-04-09 DIAGNOSIS — I132 Hypertensive heart and chronic kidney disease with heart failure and with stage 5 chronic kidney disease, or end stage renal disease: Secondary | ICD-10-CM | POA: Diagnosis not present

## 2023-04-09 DIAGNOSIS — I509 Heart failure, unspecified: Secondary | ICD-10-CM | POA: Diagnosis not present

## 2023-04-09 DIAGNOSIS — Z87891 Personal history of nicotine dependence: Secondary | ICD-10-CM | POA: Diagnosis not present

## 2023-04-10 DIAGNOSIS — Z87891 Personal history of nicotine dependence: Secondary | ICD-10-CM | POA: Diagnosis not present

## 2023-04-10 DIAGNOSIS — I132 Hypertensive heart and chronic kidney disease with heart failure and with stage 5 chronic kidney disease, or end stage renal disease: Secondary | ICD-10-CM | POA: Diagnosis not present

## 2023-04-10 DIAGNOSIS — I509 Heart failure, unspecified: Secondary | ICD-10-CM | POA: Diagnosis not present

## 2023-04-10 DIAGNOSIS — E1122 Type 2 diabetes mellitus with diabetic chronic kidney disease: Secondary | ICD-10-CM | POA: Diagnosis not present

## 2023-04-10 DIAGNOSIS — Z794 Long term (current) use of insulin: Secondary | ICD-10-CM | POA: Diagnosis not present

## 2023-04-10 DIAGNOSIS — Z992 Dependence on renal dialysis: Secondary | ICD-10-CM | POA: Diagnosis not present

## 2023-04-10 DIAGNOSIS — N186 End stage renal disease: Secondary | ICD-10-CM | POA: Diagnosis not present

## 2023-04-13 DIAGNOSIS — Z87891 Personal history of nicotine dependence: Secondary | ICD-10-CM | POA: Diagnosis not present

## 2023-04-13 DIAGNOSIS — I509 Heart failure, unspecified: Secondary | ICD-10-CM | POA: Diagnosis not present

## 2023-04-13 DIAGNOSIS — N186 End stage renal disease: Secondary | ICD-10-CM | POA: Diagnosis not present

## 2023-04-13 DIAGNOSIS — E1122 Type 2 diabetes mellitus with diabetic chronic kidney disease: Secondary | ICD-10-CM | POA: Diagnosis not present

## 2023-04-13 DIAGNOSIS — Z794 Long term (current) use of insulin: Secondary | ICD-10-CM | POA: Diagnosis not present

## 2023-04-13 DIAGNOSIS — I132 Hypertensive heart and chronic kidney disease with heart failure and with stage 5 chronic kidney disease, or end stage renal disease: Secondary | ICD-10-CM | POA: Diagnosis not present

## 2023-04-13 DIAGNOSIS — Z992 Dependence on renal dialysis: Secondary | ICD-10-CM | POA: Diagnosis not present

## 2023-04-14 DIAGNOSIS — Z794 Long term (current) use of insulin: Secondary | ICD-10-CM | POA: Diagnosis not present

## 2023-04-14 DIAGNOSIS — N186 End stage renal disease: Secondary | ICD-10-CM | POA: Diagnosis not present

## 2023-04-14 DIAGNOSIS — Z992 Dependence on renal dialysis: Secondary | ICD-10-CM | POA: Diagnosis not present

## 2023-04-14 DIAGNOSIS — E1122 Type 2 diabetes mellitus with diabetic chronic kidney disease: Secondary | ICD-10-CM | POA: Diagnosis not present

## 2023-04-14 DIAGNOSIS — I132 Hypertensive heart and chronic kidney disease with heart failure and with stage 5 chronic kidney disease, or end stage renal disease: Secondary | ICD-10-CM | POA: Diagnosis not present

## 2023-04-14 DIAGNOSIS — I509 Heart failure, unspecified: Secondary | ICD-10-CM | POA: Diagnosis not present

## 2023-04-14 DIAGNOSIS — Z87891 Personal history of nicotine dependence: Secondary | ICD-10-CM | POA: Diagnosis not present

## 2023-04-16 DIAGNOSIS — I132 Hypertensive heart and chronic kidney disease with heart failure and with stage 5 chronic kidney disease, or end stage renal disease: Secondary | ICD-10-CM | POA: Diagnosis not present

## 2023-04-16 DIAGNOSIS — N186 End stage renal disease: Secondary | ICD-10-CM | POA: Diagnosis not present

## 2023-04-16 DIAGNOSIS — Z794 Long term (current) use of insulin: Secondary | ICD-10-CM | POA: Diagnosis not present

## 2023-04-16 DIAGNOSIS — Z992 Dependence on renal dialysis: Secondary | ICD-10-CM | POA: Diagnosis not present

## 2023-04-16 DIAGNOSIS — I509 Heart failure, unspecified: Secondary | ICD-10-CM | POA: Diagnosis not present

## 2023-04-16 DIAGNOSIS — E1122 Type 2 diabetes mellitus with diabetic chronic kidney disease: Secondary | ICD-10-CM | POA: Diagnosis not present

## 2023-04-16 DIAGNOSIS — Z87891 Personal history of nicotine dependence: Secondary | ICD-10-CM | POA: Diagnosis not present

## 2023-04-17 DIAGNOSIS — Z87891 Personal history of nicotine dependence: Secondary | ICD-10-CM | POA: Diagnosis not present

## 2023-04-17 DIAGNOSIS — E1122 Type 2 diabetes mellitus with diabetic chronic kidney disease: Secondary | ICD-10-CM | POA: Diagnosis not present

## 2023-04-17 DIAGNOSIS — Z992 Dependence on renal dialysis: Secondary | ICD-10-CM | POA: Diagnosis not present

## 2023-04-17 DIAGNOSIS — I132 Hypertensive heart and chronic kidney disease with heart failure and with stage 5 chronic kidney disease, or end stage renal disease: Secondary | ICD-10-CM | POA: Diagnosis not present

## 2023-04-17 DIAGNOSIS — I509 Heart failure, unspecified: Secondary | ICD-10-CM | POA: Diagnosis not present

## 2023-04-17 DIAGNOSIS — N186 End stage renal disease: Secondary | ICD-10-CM | POA: Diagnosis not present

## 2023-04-17 DIAGNOSIS — I129 Hypertensive chronic kidney disease with stage 1 through stage 4 chronic kidney disease, or unspecified chronic kidney disease: Secondary | ICD-10-CM | POA: Diagnosis not present

## 2023-04-17 DIAGNOSIS — Z794 Long term (current) use of insulin: Secondary | ICD-10-CM | POA: Diagnosis not present

## 2023-04-20 DIAGNOSIS — T8249XA Other complication of vascular dialysis catheter, initial encounter: Secondary | ICD-10-CM | POA: Diagnosis not present

## 2023-04-20 DIAGNOSIS — N2581 Secondary hyperparathyroidism of renal origin: Secondary | ICD-10-CM | POA: Diagnosis not present

## 2023-04-20 DIAGNOSIS — D509 Iron deficiency anemia, unspecified: Secondary | ICD-10-CM | POA: Diagnosis not present

## 2023-04-20 DIAGNOSIS — D689 Coagulation defect, unspecified: Secondary | ICD-10-CM | POA: Diagnosis not present

## 2023-04-20 DIAGNOSIS — Z23 Encounter for immunization: Secondary | ICD-10-CM | POA: Diagnosis not present

## 2023-04-20 DIAGNOSIS — Z992 Dependence on renal dialysis: Secondary | ICD-10-CM | POA: Diagnosis not present

## 2023-04-20 DIAGNOSIS — N186 End stage renal disease: Secondary | ICD-10-CM | POA: Diagnosis not present

## 2023-04-22 DIAGNOSIS — N2581 Secondary hyperparathyroidism of renal origin: Secondary | ICD-10-CM | POA: Diagnosis not present

## 2023-04-22 DIAGNOSIS — D689 Coagulation defect, unspecified: Secondary | ICD-10-CM | POA: Diagnosis not present

## 2023-04-22 DIAGNOSIS — Z23 Encounter for immunization: Secondary | ICD-10-CM | POA: Diagnosis not present

## 2023-04-22 DIAGNOSIS — D509 Iron deficiency anemia, unspecified: Secondary | ICD-10-CM | POA: Diagnosis not present

## 2023-04-22 DIAGNOSIS — T8249XA Other complication of vascular dialysis catheter, initial encounter: Secondary | ICD-10-CM | POA: Diagnosis not present

## 2023-04-22 DIAGNOSIS — N186 End stage renal disease: Secondary | ICD-10-CM | POA: Diagnosis not present

## 2023-04-22 DIAGNOSIS — Z992 Dependence on renal dialysis: Secondary | ICD-10-CM | POA: Diagnosis not present

## 2023-04-24 DIAGNOSIS — T8249XA Other complication of vascular dialysis catheter, initial encounter: Secondary | ICD-10-CM | POA: Diagnosis not present

## 2023-04-24 DIAGNOSIS — D509 Iron deficiency anemia, unspecified: Secondary | ICD-10-CM | POA: Diagnosis not present

## 2023-04-24 DIAGNOSIS — Z23 Encounter for immunization: Secondary | ICD-10-CM | POA: Diagnosis not present

## 2023-04-24 DIAGNOSIS — D689 Coagulation defect, unspecified: Secondary | ICD-10-CM | POA: Diagnosis not present

## 2023-04-24 DIAGNOSIS — Z992 Dependence on renal dialysis: Secondary | ICD-10-CM | POA: Diagnosis not present

## 2023-04-24 DIAGNOSIS — N2581 Secondary hyperparathyroidism of renal origin: Secondary | ICD-10-CM | POA: Diagnosis not present

## 2023-04-24 DIAGNOSIS — N186 End stage renal disease: Secondary | ICD-10-CM | POA: Diagnosis not present

## 2023-04-27 DIAGNOSIS — D689 Coagulation defect, unspecified: Secondary | ICD-10-CM | POA: Diagnosis not present

## 2023-04-27 DIAGNOSIS — D509 Iron deficiency anemia, unspecified: Secondary | ICD-10-CM | POA: Diagnosis not present

## 2023-04-27 DIAGNOSIS — E785 Hyperlipidemia, unspecified: Secondary | ICD-10-CM | POA: Diagnosis not present

## 2023-04-27 DIAGNOSIS — E1142 Type 2 diabetes mellitus with diabetic polyneuropathy: Secondary | ICD-10-CM | POA: Diagnosis not present

## 2023-04-27 DIAGNOSIS — N186 End stage renal disease: Secondary | ICD-10-CM | POA: Diagnosis not present

## 2023-04-27 DIAGNOSIS — T8249XA Other complication of vascular dialysis catheter, initial encounter: Secondary | ICD-10-CM | POA: Diagnosis not present

## 2023-04-27 DIAGNOSIS — I10 Essential (primary) hypertension: Secondary | ICD-10-CM | POA: Diagnosis not present

## 2023-04-27 DIAGNOSIS — N2581 Secondary hyperparathyroidism of renal origin: Secondary | ICD-10-CM | POA: Diagnosis not present

## 2023-04-27 DIAGNOSIS — N184 Chronic kidney disease, stage 4 (severe): Secondary | ICD-10-CM | POA: Diagnosis not present

## 2023-04-27 DIAGNOSIS — Z992 Dependence on renal dialysis: Secondary | ICD-10-CM | POA: Diagnosis not present

## 2023-04-27 DIAGNOSIS — Z23 Encounter for immunization: Secondary | ICD-10-CM | POA: Diagnosis not present

## 2023-04-29 DIAGNOSIS — N2581 Secondary hyperparathyroidism of renal origin: Secondary | ICD-10-CM | POA: Diagnosis not present

## 2023-04-29 DIAGNOSIS — Z23 Encounter for immunization: Secondary | ICD-10-CM | POA: Diagnosis not present

## 2023-04-29 DIAGNOSIS — T8249XA Other complication of vascular dialysis catheter, initial encounter: Secondary | ICD-10-CM | POA: Diagnosis not present

## 2023-04-29 DIAGNOSIS — D509 Iron deficiency anemia, unspecified: Secondary | ICD-10-CM | POA: Diagnosis not present

## 2023-04-29 DIAGNOSIS — N186 End stage renal disease: Secondary | ICD-10-CM | POA: Diagnosis not present

## 2023-04-29 DIAGNOSIS — D689 Coagulation defect, unspecified: Secondary | ICD-10-CM | POA: Diagnosis not present

## 2023-04-29 DIAGNOSIS — Z992 Dependence on renal dialysis: Secondary | ICD-10-CM | POA: Diagnosis not present

## 2023-05-01 DIAGNOSIS — T8249XA Other complication of vascular dialysis catheter, initial encounter: Secondary | ICD-10-CM | POA: Diagnosis not present

## 2023-05-01 DIAGNOSIS — N186 End stage renal disease: Secondary | ICD-10-CM | POA: Diagnosis not present

## 2023-05-01 DIAGNOSIS — Z992 Dependence on renal dialysis: Secondary | ICD-10-CM | POA: Diagnosis not present

## 2023-05-01 DIAGNOSIS — Z23 Encounter for immunization: Secondary | ICD-10-CM | POA: Diagnosis not present

## 2023-05-01 DIAGNOSIS — D509 Iron deficiency anemia, unspecified: Secondary | ICD-10-CM | POA: Diagnosis not present

## 2023-05-01 DIAGNOSIS — D689 Coagulation defect, unspecified: Secondary | ICD-10-CM | POA: Diagnosis not present

## 2023-05-01 DIAGNOSIS — N2581 Secondary hyperparathyroidism of renal origin: Secondary | ICD-10-CM | POA: Diagnosis not present

## 2023-05-04 DIAGNOSIS — Z23 Encounter for immunization: Secondary | ICD-10-CM | POA: Diagnosis not present

## 2023-05-04 DIAGNOSIS — N186 End stage renal disease: Secondary | ICD-10-CM | POA: Diagnosis not present

## 2023-05-04 DIAGNOSIS — N2581 Secondary hyperparathyroidism of renal origin: Secondary | ICD-10-CM | POA: Diagnosis not present

## 2023-05-04 DIAGNOSIS — D509 Iron deficiency anemia, unspecified: Secondary | ICD-10-CM | POA: Diagnosis not present

## 2023-05-04 DIAGNOSIS — D689 Coagulation defect, unspecified: Secondary | ICD-10-CM | POA: Diagnosis not present

## 2023-05-04 DIAGNOSIS — Z992 Dependence on renal dialysis: Secondary | ICD-10-CM | POA: Diagnosis not present

## 2023-05-04 DIAGNOSIS — T8249XA Other complication of vascular dialysis catheter, initial encounter: Secondary | ICD-10-CM | POA: Diagnosis not present

## 2023-05-06 DIAGNOSIS — D689 Coagulation defect, unspecified: Secondary | ICD-10-CM | POA: Diagnosis not present

## 2023-05-06 DIAGNOSIS — N2581 Secondary hyperparathyroidism of renal origin: Secondary | ICD-10-CM | POA: Diagnosis not present

## 2023-05-06 DIAGNOSIS — Z23 Encounter for immunization: Secondary | ICD-10-CM | POA: Diagnosis not present

## 2023-05-06 DIAGNOSIS — D509 Iron deficiency anemia, unspecified: Secondary | ICD-10-CM | POA: Diagnosis not present

## 2023-05-06 DIAGNOSIS — Z992 Dependence on renal dialysis: Secondary | ICD-10-CM | POA: Diagnosis not present

## 2023-05-06 DIAGNOSIS — N186 End stage renal disease: Secondary | ICD-10-CM | POA: Diagnosis not present

## 2023-05-06 DIAGNOSIS — T8249XA Other complication of vascular dialysis catheter, initial encounter: Secondary | ICD-10-CM | POA: Diagnosis not present

## 2023-05-08 DIAGNOSIS — D509 Iron deficiency anemia, unspecified: Secondary | ICD-10-CM | POA: Diagnosis not present

## 2023-05-08 DIAGNOSIS — D689 Coagulation defect, unspecified: Secondary | ICD-10-CM | POA: Diagnosis not present

## 2023-05-08 DIAGNOSIS — Z23 Encounter for immunization: Secondary | ICD-10-CM | POA: Diagnosis not present

## 2023-05-08 DIAGNOSIS — Z992 Dependence on renal dialysis: Secondary | ICD-10-CM | POA: Diagnosis not present

## 2023-05-08 DIAGNOSIS — N2581 Secondary hyperparathyroidism of renal origin: Secondary | ICD-10-CM | POA: Diagnosis not present

## 2023-05-08 DIAGNOSIS — T8249XA Other complication of vascular dialysis catheter, initial encounter: Secondary | ICD-10-CM | POA: Diagnosis not present

## 2023-05-08 DIAGNOSIS — N186 End stage renal disease: Secondary | ICD-10-CM | POA: Diagnosis not present

## 2023-05-11 DIAGNOSIS — Z23 Encounter for immunization: Secondary | ICD-10-CM | POA: Diagnosis not present

## 2023-05-11 DIAGNOSIS — N186 End stage renal disease: Secondary | ICD-10-CM | POA: Diagnosis not present

## 2023-05-11 DIAGNOSIS — N2581 Secondary hyperparathyroidism of renal origin: Secondary | ICD-10-CM | POA: Diagnosis not present

## 2023-05-11 DIAGNOSIS — Z992 Dependence on renal dialysis: Secondary | ICD-10-CM | POA: Diagnosis not present

## 2023-05-11 DIAGNOSIS — D689 Coagulation defect, unspecified: Secondary | ICD-10-CM | POA: Diagnosis not present

## 2023-05-11 DIAGNOSIS — D509 Iron deficiency anemia, unspecified: Secondary | ICD-10-CM | POA: Diagnosis not present

## 2023-05-11 DIAGNOSIS — T8249XA Other complication of vascular dialysis catheter, initial encounter: Secondary | ICD-10-CM | POA: Diagnosis not present

## 2023-05-13 DIAGNOSIS — D509 Iron deficiency anemia, unspecified: Secondary | ICD-10-CM | POA: Diagnosis not present

## 2023-05-13 DIAGNOSIS — Z992 Dependence on renal dialysis: Secondary | ICD-10-CM | POA: Diagnosis not present

## 2023-05-13 DIAGNOSIS — D689 Coagulation defect, unspecified: Secondary | ICD-10-CM | POA: Diagnosis not present

## 2023-05-13 DIAGNOSIS — T8249XA Other complication of vascular dialysis catheter, initial encounter: Secondary | ICD-10-CM | POA: Diagnosis not present

## 2023-05-13 DIAGNOSIS — Z23 Encounter for immunization: Secondary | ICD-10-CM | POA: Diagnosis not present

## 2023-05-13 DIAGNOSIS — N2581 Secondary hyperparathyroidism of renal origin: Secondary | ICD-10-CM | POA: Diagnosis not present

## 2023-05-13 DIAGNOSIS — N186 End stage renal disease: Secondary | ICD-10-CM | POA: Diagnosis not present

## 2023-05-15 DIAGNOSIS — T8249XA Other complication of vascular dialysis catheter, initial encounter: Secondary | ICD-10-CM | POA: Diagnosis not present

## 2023-05-15 DIAGNOSIS — D689 Coagulation defect, unspecified: Secondary | ICD-10-CM | POA: Diagnosis not present

## 2023-05-15 DIAGNOSIS — Z23 Encounter for immunization: Secondary | ICD-10-CM | POA: Diagnosis not present

## 2023-05-15 DIAGNOSIS — N186 End stage renal disease: Secondary | ICD-10-CM | POA: Diagnosis not present

## 2023-05-15 DIAGNOSIS — Z992 Dependence on renal dialysis: Secondary | ICD-10-CM | POA: Diagnosis not present

## 2023-05-15 DIAGNOSIS — D509 Iron deficiency anemia, unspecified: Secondary | ICD-10-CM | POA: Diagnosis not present

## 2023-05-15 DIAGNOSIS — N2581 Secondary hyperparathyroidism of renal origin: Secondary | ICD-10-CM | POA: Diagnosis not present

## 2023-05-17 DIAGNOSIS — I129 Hypertensive chronic kidney disease with stage 1 through stage 4 chronic kidney disease, or unspecified chronic kidney disease: Secondary | ICD-10-CM | POA: Diagnosis not present

## 2023-05-17 DIAGNOSIS — N186 End stage renal disease: Secondary | ICD-10-CM | POA: Diagnosis not present

## 2023-05-17 DIAGNOSIS — Z992 Dependence on renal dialysis: Secondary | ICD-10-CM | POA: Diagnosis not present

## 2023-05-18 DIAGNOSIS — T8249XA Other complication of vascular dialysis catheter, initial encounter: Secondary | ICD-10-CM | POA: Diagnosis not present

## 2023-05-18 DIAGNOSIS — D631 Anemia in chronic kidney disease: Secondary | ICD-10-CM | POA: Diagnosis not present

## 2023-05-18 DIAGNOSIS — N186 End stage renal disease: Secondary | ICD-10-CM | POA: Diagnosis not present

## 2023-05-18 DIAGNOSIS — Z992 Dependence on renal dialysis: Secondary | ICD-10-CM | POA: Diagnosis not present

## 2023-05-18 DIAGNOSIS — D689 Coagulation defect, unspecified: Secondary | ICD-10-CM | POA: Diagnosis not present

## 2023-05-18 DIAGNOSIS — Z23 Encounter for immunization: Secondary | ICD-10-CM | POA: Diagnosis not present

## 2023-05-18 DIAGNOSIS — N2581 Secondary hyperparathyroidism of renal origin: Secondary | ICD-10-CM | POA: Diagnosis not present

## 2023-05-21 DIAGNOSIS — Z992 Dependence on renal dialysis: Secondary | ICD-10-CM | POA: Diagnosis not present

## 2023-05-21 DIAGNOSIS — N2581 Secondary hyperparathyroidism of renal origin: Secondary | ICD-10-CM | POA: Diagnosis not present

## 2023-05-21 DIAGNOSIS — D631 Anemia in chronic kidney disease: Secondary | ICD-10-CM | POA: Diagnosis not present

## 2023-05-21 DIAGNOSIS — N186 End stage renal disease: Secondary | ICD-10-CM | POA: Diagnosis not present

## 2023-05-21 DIAGNOSIS — Z23 Encounter for immunization: Secondary | ICD-10-CM | POA: Diagnosis not present

## 2023-05-21 DIAGNOSIS — T8249XA Other complication of vascular dialysis catheter, initial encounter: Secondary | ICD-10-CM | POA: Diagnosis not present

## 2023-05-21 DIAGNOSIS — D689 Coagulation defect, unspecified: Secondary | ICD-10-CM | POA: Diagnosis not present

## 2023-05-22 DIAGNOSIS — D631 Anemia in chronic kidney disease: Secondary | ICD-10-CM | POA: Diagnosis not present

## 2023-05-22 DIAGNOSIS — N186 End stage renal disease: Secondary | ICD-10-CM | POA: Diagnosis not present

## 2023-05-22 DIAGNOSIS — Z992 Dependence on renal dialysis: Secondary | ICD-10-CM | POA: Diagnosis not present

## 2023-05-22 DIAGNOSIS — Z23 Encounter for immunization: Secondary | ICD-10-CM | POA: Diagnosis not present

## 2023-05-22 DIAGNOSIS — N2581 Secondary hyperparathyroidism of renal origin: Secondary | ICD-10-CM | POA: Diagnosis not present

## 2023-05-22 DIAGNOSIS — D689 Coagulation defect, unspecified: Secondary | ICD-10-CM | POA: Diagnosis not present

## 2023-05-22 DIAGNOSIS — T8249XA Other complication of vascular dialysis catheter, initial encounter: Secondary | ICD-10-CM | POA: Diagnosis not present

## 2023-05-25 DIAGNOSIS — N2581 Secondary hyperparathyroidism of renal origin: Secondary | ICD-10-CM | POA: Diagnosis not present

## 2023-05-25 DIAGNOSIS — Z992 Dependence on renal dialysis: Secondary | ICD-10-CM | POA: Diagnosis not present

## 2023-05-25 DIAGNOSIS — D631 Anemia in chronic kidney disease: Secondary | ICD-10-CM | POA: Diagnosis not present

## 2023-05-25 DIAGNOSIS — Z23 Encounter for immunization: Secondary | ICD-10-CM | POA: Diagnosis not present

## 2023-05-25 DIAGNOSIS — T8249XA Other complication of vascular dialysis catheter, initial encounter: Secondary | ICD-10-CM | POA: Diagnosis not present

## 2023-05-25 DIAGNOSIS — N186 End stage renal disease: Secondary | ICD-10-CM | POA: Diagnosis not present

## 2023-05-25 DIAGNOSIS — D689 Coagulation defect, unspecified: Secondary | ICD-10-CM | POA: Diagnosis not present

## 2023-05-25 NOTE — Progress Notes (Unsigned)
[facility] [place]  ASSESSMENT / PLAN: Tricia Huynh is a 67 y.o. with end-stage renal disease dialyzing Monday Wednesday and Friday through a left arm brachial cephalic arteriovenous fistula and tunneled dialysis catheter.  I think she is a marginal candidate for peritoneal dialysis.  At this moment, she prefers dialysis at this center.  I encouraged her to discuss this further with her nephrologist.  Should she desire to transition to peritoneal dialysis, I be happy to place a laparoscopic peritoneal dialysis catheter for her.  CHIEF COMPLAINT: Evaluate for peritoneal dialysis  HISTORY OF PRESENT ILLNESS: Tricia Huynh is a 67 y.o. female with end-stage renal disease dialyzing Monday Wednesday and Friday through left arm brachiocephalic AV fistula and tunneled dialysis catheter.  Patient had her fistula placed/28/24.  The patient was worked several times, but recently infiltrated requiring use of the catheter.  Patient presents to discuss peritoneal dialysis.  At this moment, the patient enjoys dialyzing at her center.  She occasionally has transportation issues.  We reviewed the unique benefits and challenges to peritoneal dialysis.  At this moment, the patient is partially dependent on care, and I feel she may not do well with peritoneal dialysis independently.  Past Medical History:  Diagnosis Date   Anemia of chronic kidney failure, stage 4 (severe) (HCC)    Diabetes mellitus type 2 in nonobese (HCC) 06/06/2015   Diabetes mellitus without complication (HCC)    Diabetic neuropathy (HCC) 06/06/2015   Dyslipidemia 06/06/2015   Encephalopathy    Essential hypertension 06/06/2015   Hypertension    Mood disorder (HCC) 06/06/2015   Pneumonia 03/11/2017   Seizure (HCC)    sees [facility]. abd eeg, on keppra    Past Surgical History:  Procedure Laterality Date   ABDOMINAL HYSTERECTOMY     AV FISTULA PLACEMENT Left 02/12/2023   Procedure: LEFT ARM BRACHIOCEPHALIC  ARTERIOVENOUS (AV) FISTULA CREATION;  Surgeon: Victorino Sparrow, MD;  Location: [facility] OR;  Service: Vascular;  Laterality: Left;   EYE SURGERY Bilateral    IR FLUORO GUIDE CV LINE RIGHT  02/06/2023   IR US GUIDE VASC ACCESS RIGHT  02/06/2023   TUBAL LIGATION      Family History  Problem Relation Age of Onset   Heart disease Father    Kidney disease Father    Diabetes Mother    Seizures Sister    Seizures Brother     Social History   Socioeconomic History   Marital status: Single    Spouse name: Not on file   Number of children: 3   Years of education: 10   Highest education level: Not on file  Occupational History   Occupation: Disabled  Tobacco Use   Smoking status: Former    Types: Cigarettes   Smokeless tobacco: Never  Vaping Use   Vaping Use: Never used  Substance and Sexual Activity   Alcohol use: No   Drug use: No   Sexual activity: Yes  Other Topics Concern   Not on file  Social History Narrative   Lives at home with her grandchildren.   Right-handed.   No caffeine use.   Social Determinants of Health   Financial Resource Strain: Not on file  Food Insecurity: No Food Insecurity (02/02/2023)   Hunger Vital Sign    Worried About Running Out of Food in the Last Year: Never true    Ran Out of Food in the Last Year: Never true  Transportation Needs: No Transportation Needs (02/02/2023)  PRAPARE - Administrator, Civil Service (Medical): No    Lack of Transportation (Non-Medical): No  Physical Activity: Not on file  Stress: Not on file  Social Connections: Not on file  Intimate Partner Violence: Not At Risk (02/02/2023)   Humiliation, Afraid, Rape, and Kick questionnaire    Fear of Current or Ex-Partner: No    Emotionally Abused: No    Physically Abused: No    Sexually Abused: No    Allergies  Allergen Reactions   Percocet [Oxycodone-Acetaminophen] Nausea And Vomiting and Other (See Comments)    Causes the patient to be shaky/unsteady, also    Vicodin [Hydrocodone-Acetaminophen] Nausea And Vomiting and Other (See Comments)    Causes the patient to be shaky/unsteady, also    Current Outpatient Medications  Medication Sig Dispense Refill   albuterol (VENTOLIN HFA) 108 (90 Base) MCG/ACT inhaler Inhale 1-2 puffs into the lungs every 6 (six) hours as needed for wheezing or shortness of breath. 1 each 0   amLODipine (NORVASC) 10 MG tablet Take 1 tablet (10 mg total) by mouth daily. 30 tablet 0   atorvastatin (LIPITOR) 40 MG tablet Take 40 mg by mouth daily.     carvedilol (COREG) 25 MG tablet Take 1 tablet (25 mg total) by mouth 2 (two) times daily with a meal. 60 tablet 0   Continuous Blood Gluc Sensor (FREESTYLE LIBRE 3 SENSOR) MISC 1 each by Does not apply route 2 (two) times a week. Place 1 sensor on the skin every 14 days. Use to check glucose continuously 2 each 1   ferrous sulfate 325 (65 FE) MG tablet Take 1 tablet (325 mg total) by mouth daily with breakfast. 30 tablet 0   gabapentin (NEURONTIN) 100 MG capsule Take 1 capsule (100 mg total) by mouth at bedtime. 30 capsule 3   hydrALAZINE (APRESOLINE) 50 MG tablet Take 50 mg by mouth 3 (three) times daily.     insulin glargine (LANTUS SOLOSTAR) 100 UNIT/ML Solostar Pen Inject 5 Units into the skin daily. Discard pen after 28 days from first use 5 mL 11   Insulin Pen Needle (TECHLITE PEN NEEDLES) 32G X 4 MM MISC Use to inject lantus once a day 100 each 11   Vitamin D, Ergocalciferol, (DRISDOL) 1.25 MG (50000 UNIT) CAPS capsule Take 50,000 Units by mouth every 7 (seven) days.     No current facility-administered medications for this visit.    PHYSICAL EXAM There were no vitals filed for this visit.  Thin woman.  Appears chronically ill Regular rate and rhythm Unlabored breathing Soft, nontender abdomen TDC in right chest Left arm brachiocephalic fistula with smooth thrill  PERTINENT LABORATORY AND RADIOLOGIC DATA  Most recent CBC    Latest Ref Rng & Units 02/13/2023     8:53 AM 02/12/2023    9:19 PM 02/11/2023    8:10 AM  CBC  WBC 4.0 - 10.5 K/uL 6.3  7.6  7.8   Hemoglobin 12.0 - 15.0 g/dL 7.2  8.5  7.3   Hematocrit 36.0 - 46.0 % 23.2  26.6  23.5   Platelets 150 - 400 K/uL 219  259  190      Most recent CMP    Latest Ref Rng & Units 02/13/2023    8:54 AM 02/12/2023    9:19 PM 02/11/2023    8:10 AM  CMP  Glucose 70 - 99 mg/dL 161  096  045   BUN 8 - 23 mg/dL 26  22  22  Creatinine 0.44 - 1.00 mg/dL 1.61  0.96  0.45   Sodium 135 - 145 mmol/L 133  130  135   Potassium 3.5 - 5.1 mmol/L 3.8  4.0  3.7   Chloride 98 - 111 mmol/L 96  92  97   CO2 22 - 32 mmol/L 26  25  26    Calcium 8.9 - 10.3 mg/dL 8.4  8.8  8.5     Renal function CrCl cannot be calculated (Patient's most recent lab result is older than the maximum 21 days allowed.).  Hemoglobin A1C (no units)  Date Value  12/06/2015 11.8   Hgb A1c MFr Bld (%)  Date Value  02/05/2023 7.0 (H)    LDL Cholesterol  Date Value Ref Range Status  04/28/2022 146 (H) 0 - 99 mg/dL Final    Comment:           Total Cholesterol/HDL:CHD Risk Coronary Heart Disease Risk Table                     Men   Women  1/2 Average Risk   3.4   3.3  Average Risk       5.0   4.4  2 X Average Risk   9.6   7.1  3 X Average Risk  23.4   11.0        Use the calculated Patient Ratio above and the CHD Risk Table to determine the patient's CHD Risk.        ATP III CLASSIFICATION (LDL):  <100     mg/dL   Optimal  409-811  mg/dL   Near or Above                    Optimal  130-159  mg/dL   Borderline  914-782  mg/dL   High  >956     mg/dL   Very High Performed at [facility], [address], [place], [place] [address]      Rande Brunt. Lenell Antu, MD FACS [facility] Phone Number: [phone] 05/25/2023 8:55 AM   Total time spent on preparing this encounter including chart review, data review, collecting history, examining the patient, coordinating care  for this established patient, 30 minutes.  Portions of this report may have been transcribed using voice recognition software.  Every effort has been made to ensure accuracy; however, inadvertent computerized transcription errors may still be present.

## 2023-05-26 ENCOUNTER — Ambulatory Visit (INDEPENDENT_AMBULATORY_CARE_PROVIDER_SITE_OTHER): Payer: 59 | Admitting: Vascular Surgery

## 2023-05-26 ENCOUNTER — Encounter: Payer: Self-pay | Admitting: Vascular Surgery

## 2023-05-26 VITALS — BP 157/79 | HR 89 | Temp 98.4°F | Resp 20 | Ht 68.0 in | Wt 118.0 lb

## 2023-05-26 DIAGNOSIS — N186 End stage renal disease: Secondary | ICD-10-CM | POA: Diagnosis not present

## 2023-05-26 DIAGNOSIS — Z992 Dependence on renal dialysis: Secondary | ICD-10-CM | POA: Diagnosis not present

## 2023-05-27 DIAGNOSIS — N186 End stage renal disease: Secondary | ICD-10-CM | POA: Diagnosis not present

## 2023-05-27 DIAGNOSIS — Z992 Dependence on renal dialysis: Secondary | ICD-10-CM | POA: Diagnosis not present

## 2023-05-27 DIAGNOSIS — D631 Anemia in chronic kidney disease: Secondary | ICD-10-CM | POA: Diagnosis not present

## 2023-05-27 DIAGNOSIS — Z23 Encounter for immunization: Secondary | ICD-10-CM | POA: Diagnosis not present

## 2023-05-27 DIAGNOSIS — N2581 Secondary hyperparathyroidism of renal origin: Secondary | ICD-10-CM | POA: Diagnosis not present

## 2023-05-27 DIAGNOSIS — T8249XA Other complication of vascular dialysis catheter, initial encounter: Secondary | ICD-10-CM | POA: Diagnosis not present

## 2023-05-27 DIAGNOSIS — D689 Coagulation defect, unspecified: Secondary | ICD-10-CM | POA: Diagnosis not present

## 2023-05-28 DIAGNOSIS — E785 Hyperlipidemia, unspecified: Secondary | ICD-10-CM | POA: Diagnosis not present

## 2023-05-28 DIAGNOSIS — I10 Essential (primary) hypertension: Secondary | ICD-10-CM | POA: Diagnosis not present

## 2023-05-28 DIAGNOSIS — E1142 Type 2 diabetes mellitus with diabetic polyneuropathy: Secondary | ICD-10-CM | POA: Diagnosis not present

## 2023-05-28 DIAGNOSIS — N184 Chronic kidney disease, stage 4 (severe): Secondary | ICD-10-CM | POA: Diagnosis not present

## 2023-05-29 DIAGNOSIS — D689 Coagulation defect, unspecified: Secondary | ICD-10-CM | POA: Diagnosis not present

## 2023-05-29 DIAGNOSIS — D631 Anemia in chronic kidney disease: Secondary | ICD-10-CM | POA: Diagnosis not present

## 2023-05-29 DIAGNOSIS — N2581 Secondary hyperparathyroidism of renal origin: Secondary | ICD-10-CM | POA: Diagnosis not present

## 2023-05-29 DIAGNOSIS — N186 End stage renal disease: Secondary | ICD-10-CM | POA: Diagnosis not present

## 2023-05-29 DIAGNOSIS — Z992 Dependence on renal dialysis: Secondary | ICD-10-CM | POA: Diagnosis not present

## 2023-05-29 DIAGNOSIS — Z23 Encounter for immunization: Secondary | ICD-10-CM | POA: Diagnosis not present

## 2023-05-29 DIAGNOSIS — T8249XA Other complication of vascular dialysis catheter, initial encounter: Secondary | ICD-10-CM | POA: Diagnosis not present

## 2023-06-01 DIAGNOSIS — N186 End stage renal disease: Secondary | ICD-10-CM | POA: Diagnosis not present

## 2023-06-01 DIAGNOSIS — Z992 Dependence on renal dialysis: Secondary | ICD-10-CM | POA: Diagnosis not present

## 2023-06-01 DIAGNOSIS — N2581 Secondary hyperparathyroidism of renal origin: Secondary | ICD-10-CM | POA: Diagnosis not present

## 2023-06-01 DIAGNOSIS — T8249XA Other complication of vascular dialysis catheter, initial encounter: Secondary | ICD-10-CM | POA: Diagnosis not present

## 2023-06-01 DIAGNOSIS — Z23 Encounter for immunization: Secondary | ICD-10-CM | POA: Diagnosis not present

## 2023-06-01 DIAGNOSIS — D631 Anemia in chronic kidney disease: Secondary | ICD-10-CM | POA: Diagnosis not present

## 2023-06-01 DIAGNOSIS — D689 Coagulation defect, unspecified: Secondary | ICD-10-CM | POA: Diagnosis not present

## 2023-06-03 DIAGNOSIS — Z23 Encounter for immunization: Secondary | ICD-10-CM | POA: Diagnosis not present

## 2023-06-03 DIAGNOSIS — N2581 Secondary hyperparathyroidism of renal origin: Secondary | ICD-10-CM | POA: Diagnosis not present

## 2023-06-03 DIAGNOSIS — D689 Coagulation defect, unspecified: Secondary | ICD-10-CM | POA: Diagnosis not present

## 2023-06-03 DIAGNOSIS — D631 Anemia in chronic kidney disease: Secondary | ICD-10-CM | POA: Diagnosis not present

## 2023-06-03 DIAGNOSIS — Z992 Dependence on renal dialysis: Secondary | ICD-10-CM | POA: Diagnosis not present

## 2023-06-03 DIAGNOSIS — T8249XA Other complication of vascular dialysis catheter, initial encounter: Secondary | ICD-10-CM | POA: Diagnosis not present

## 2023-06-03 DIAGNOSIS — N186 End stage renal disease: Secondary | ICD-10-CM | POA: Diagnosis not present

## 2023-06-04 DIAGNOSIS — N186 End stage renal disease: Secondary | ICD-10-CM | POA: Diagnosis not present

## 2023-06-04 DIAGNOSIS — Z992 Dependence on renal dialysis: Secondary | ICD-10-CM | POA: Diagnosis not present

## 2023-06-04 DIAGNOSIS — Z452 Encounter for adjustment and management of vascular access device: Secondary | ICD-10-CM | POA: Diagnosis not present

## 2023-06-05 DIAGNOSIS — D689 Coagulation defect, unspecified: Secondary | ICD-10-CM | POA: Diagnosis not present

## 2023-06-05 DIAGNOSIS — N2581 Secondary hyperparathyroidism of renal origin: Secondary | ICD-10-CM | POA: Diagnosis not present

## 2023-06-05 DIAGNOSIS — T8249XA Other complication of vascular dialysis catheter, initial encounter: Secondary | ICD-10-CM | POA: Diagnosis not present

## 2023-06-05 DIAGNOSIS — Z23 Encounter for immunization: Secondary | ICD-10-CM | POA: Diagnosis not present

## 2023-06-05 DIAGNOSIS — D631 Anemia in chronic kidney disease: Secondary | ICD-10-CM | POA: Diagnosis not present

## 2023-06-05 DIAGNOSIS — N186 End stage renal disease: Secondary | ICD-10-CM | POA: Diagnosis not present

## 2023-06-05 DIAGNOSIS — Z992 Dependence on renal dialysis: Secondary | ICD-10-CM | POA: Diagnosis not present

## 2023-06-08 DIAGNOSIS — Z23 Encounter for immunization: Secondary | ICD-10-CM | POA: Diagnosis not present

## 2023-06-08 DIAGNOSIS — N186 End stage renal disease: Secondary | ICD-10-CM | POA: Diagnosis not present

## 2023-06-08 DIAGNOSIS — D689 Coagulation defect, unspecified: Secondary | ICD-10-CM | POA: Diagnosis not present

## 2023-06-08 DIAGNOSIS — Z992 Dependence on renal dialysis: Secondary | ICD-10-CM | POA: Diagnosis not present

## 2023-06-08 DIAGNOSIS — T8249XA Other complication of vascular dialysis catheter, initial encounter: Secondary | ICD-10-CM | POA: Diagnosis not present

## 2023-06-08 DIAGNOSIS — D631 Anemia in chronic kidney disease: Secondary | ICD-10-CM | POA: Diagnosis not present

## 2023-06-08 DIAGNOSIS — N2581 Secondary hyperparathyroidism of renal origin: Secondary | ICD-10-CM | POA: Diagnosis not present

## 2023-06-10 DIAGNOSIS — T8249XA Other complication of vascular dialysis catheter, initial encounter: Secondary | ICD-10-CM | POA: Diagnosis not present

## 2023-06-10 DIAGNOSIS — Z992 Dependence on renal dialysis: Secondary | ICD-10-CM | POA: Diagnosis not present

## 2023-06-10 DIAGNOSIS — D689 Coagulation defect, unspecified: Secondary | ICD-10-CM | POA: Diagnosis not present

## 2023-06-10 DIAGNOSIS — D631 Anemia in chronic kidney disease: Secondary | ICD-10-CM | POA: Diagnosis not present

## 2023-06-10 DIAGNOSIS — N186 End stage renal disease: Secondary | ICD-10-CM | POA: Diagnosis not present

## 2023-06-10 DIAGNOSIS — N2581 Secondary hyperparathyroidism of renal origin: Secondary | ICD-10-CM | POA: Diagnosis not present

## 2023-06-10 DIAGNOSIS — Z23 Encounter for immunization: Secondary | ICD-10-CM | POA: Diagnosis not present

## 2023-06-12 DIAGNOSIS — D631 Anemia in chronic kidney disease: Secondary | ICD-10-CM | POA: Diagnosis not present

## 2023-06-12 DIAGNOSIS — D689 Coagulation defect, unspecified: Secondary | ICD-10-CM | POA: Diagnosis not present

## 2023-06-12 DIAGNOSIS — Z992 Dependence on renal dialysis: Secondary | ICD-10-CM | POA: Diagnosis not present

## 2023-06-12 DIAGNOSIS — N2581 Secondary hyperparathyroidism of renal origin: Secondary | ICD-10-CM | POA: Diagnosis not present

## 2023-06-12 DIAGNOSIS — Z23 Encounter for immunization: Secondary | ICD-10-CM | POA: Diagnosis not present

## 2023-06-12 DIAGNOSIS — T8249XA Other complication of vascular dialysis catheter, initial encounter: Secondary | ICD-10-CM | POA: Diagnosis not present

## 2023-06-12 DIAGNOSIS — N186 End stage renal disease: Secondary | ICD-10-CM | POA: Diagnosis not present

## 2023-06-15 DIAGNOSIS — N186 End stage renal disease: Secondary | ICD-10-CM | POA: Diagnosis not present

## 2023-06-15 DIAGNOSIS — Z23 Encounter for immunization: Secondary | ICD-10-CM | POA: Diagnosis not present

## 2023-06-15 DIAGNOSIS — D689 Coagulation defect, unspecified: Secondary | ICD-10-CM | POA: Diagnosis not present

## 2023-06-15 DIAGNOSIS — D631 Anemia in chronic kidney disease: Secondary | ICD-10-CM | POA: Diagnosis not present

## 2023-06-15 DIAGNOSIS — N2581 Secondary hyperparathyroidism of renal origin: Secondary | ICD-10-CM | POA: Diagnosis not present

## 2023-06-15 DIAGNOSIS — Z992 Dependence on renal dialysis: Secondary | ICD-10-CM | POA: Diagnosis not present

## 2023-06-15 DIAGNOSIS — T8249XA Other complication of vascular dialysis catheter, initial encounter: Secondary | ICD-10-CM | POA: Diagnosis not present

## 2023-06-17 DIAGNOSIS — N186 End stage renal disease: Secondary | ICD-10-CM | POA: Diagnosis not present

## 2023-06-17 DIAGNOSIS — Z23 Encounter for immunization: Secondary | ICD-10-CM | POA: Diagnosis not present

## 2023-06-17 DIAGNOSIS — N2581 Secondary hyperparathyroidism of renal origin: Secondary | ICD-10-CM | POA: Diagnosis not present

## 2023-06-17 DIAGNOSIS — T8249XA Other complication of vascular dialysis catheter, initial encounter: Secondary | ICD-10-CM | POA: Diagnosis not present

## 2023-06-17 DIAGNOSIS — Z992 Dependence on renal dialysis: Secondary | ICD-10-CM | POA: Diagnosis not present

## 2023-06-17 DIAGNOSIS — D631 Anemia in chronic kidney disease: Secondary | ICD-10-CM | POA: Diagnosis not present

## 2023-06-17 DIAGNOSIS — I129 Hypertensive chronic kidney disease with stage 1 through stage 4 chronic kidney disease, or unspecified chronic kidney disease: Secondary | ICD-10-CM | POA: Diagnosis not present

## 2023-06-17 DIAGNOSIS — D689 Coagulation defect, unspecified: Secondary | ICD-10-CM | POA: Diagnosis not present

## 2023-06-22 DIAGNOSIS — T82898A Other specified complication of vascular prosthetic devices, implants and grafts, initial encounter: Secondary | ICD-10-CM | POA: Diagnosis not present

## 2023-06-22 DIAGNOSIS — Z23 Encounter for immunization: Secondary | ICD-10-CM | POA: Diagnosis not present

## 2023-06-22 DIAGNOSIS — Z992 Dependence on renal dialysis: Secondary | ICD-10-CM | POA: Diagnosis not present

## 2023-06-22 DIAGNOSIS — D689 Coagulation defect, unspecified: Secondary | ICD-10-CM | POA: Diagnosis not present

## 2023-06-22 DIAGNOSIS — N186 End stage renal disease: Secondary | ICD-10-CM | POA: Diagnosis not present

## 2023-06-22 DIAGNOSIS — N2581 Secondary hyperparathyroidism of renal origin: Secondary | ICD-10-CM | POA: Diagnosis not present

## 2023-06-22 DIAGNOSIS — D631 Anemia in chronic kidney disease: Secondary | ICD-10-CM | POA: Diagnosis not present

## 2023-06-24 DIAGNOSIS — D631 Anemia in chronic kidney disease: Secondary | ICD-10-CM | POA: Diagnosis not present

## 2023-06-24 DIAGNOSIS — N2581 Secondary hyperparathyroidism of renal origin: Secondary | ICD-10-CM | POA: Diagnosis not present

## 2023-06-24 DIAGNOSIS — Z23 Encounter for immunization: Secondary | ICD-10-CM | POA: Diagnosis not present

## 2023-06-24 DIAGNOSIS — N186 End stage renal disease: Secondary | ICD-10-CM | POA: Diagnosis not present

## 2023-06-24 DIAGNOSIS — Z992 Dependence on renal dialysis: Secondary | ICD-10-CM | POA: Diagnosis not present

## 2023-06-24 DIAGNOSIS — D689 Coagulation defect, unspecified: Secondary | ICD-10-CM | POA: Diagnosis not present

## 2023-06-26 DIAGNOSIS — D689 Coagulation defect, unspecified: Secondary | ICD-10-CM | POA: Diagnosis not present

## 2023-06-26 DIAGNOSIS — N186 End stage renal disease: Secondary | ICD-10-CM | POA: Diagnosis not present

## 2023-06-26 DIAGNOSIS — Z992 Dependence on renal dialysis: Secondary | ICD-10-CM | POA: Diagnosis not present

## 2023-06-26 DIAGNOSIS — N2581 Secondary hyperparathyroidism of renal origin: Secondary | ICD-10-CM | POA: Diagnosis not present

## 2023-06-26 DIAGNOSIS — D631 Anemia in chronic kidney disease: Secondary | ICD-10-CM | POA: Diagnosis not present

## 2023-06-26 DIAGNOSIS — Z23 Encounter for immunization: Secondary | ICD-10-CM | POA: Diagnosis not present

## 2023-06-29 DIAGNOSIS — N186 End stage renal disease: Secondary | ICD-10-CM | POA: Diagnosis not present

## 2023-06-29 DIAGNOSIS — D631 Anemia in chronic kidney disease: Secondary | ICD-10-CM | POA: Diagnosis not present

## 2023-06-29 DIAGNOSIS — D689 Coagulation defect, unspecified: Secondary | ICD-10-CM | POA: Diagnosis not present

## 2023-06-29 DIAGNOSIS — N2581 Secondary hyperparathyroidism of renal origin: Secondary | ICD-10-CM | POA: Diagnosis not present

## 2023-06-29 DIAGNOSIS — Z992 Dependence on renal dialysis: Secondary | ICD-10-CM | POA: Diagnosis not present

## 2023-06-29 DIAGNOSIS — Z23 Encounter for immunization: Secondary | ICD-10-CM | POA: Diagnosis not present

## 2023-07-01 DIAGNOSIS — D689 Coagulation defect, unspecified: Secondary | ICD-10-CM | POA: Diagnosis not present

## 2023-07-01 DIAGNOSIS — D631 Anemia in chronic kidney disease: Secondary | ICD-10-CM | POA: Diagnosis not present

## 2023-07-01 DIAGNOSIS — Z992 Dependence on renal dialysis: Secondary | ICD-10-CM | POA: Diagnosis not present

## 2023-07-01 DIAGNOSIS — N2581 Secondary hyperparathyroidism of renal origin: Secondary | ICD-10-CM | POA: Diagnosis not present

## 2023-07-01 DIAGNOSIS — N186 End stage renal disease: Secondary | ICD-10-CM | POA: Diagnosis not present

## 2023-07-01 DIAGNOSIS — Z23 Encounter for immunization: Secondary | ICD-10-CM | POA: Diagnosis not present

## 2023-07-03 DIAGNOSIS — N2581 Secondary hyperparathyroidism of renal origin: Secondary | ICD-10-CM | POA: Diagnosis not present

## 2023-07-03 DIAGNOSIS — Z992 Dependence on renal dialysis: Secondary | ICD-10-CM | POA: Diagnosis not present

## 2023-07-03 DIAGNOSIS — D689 Coagulation defect, unspecified: Secondary | ICD-10-CM | POA: Diagnosis not present

## 2023-07-03 DIAGNOSIS — N186 End stage renal disease: Secondary | ICD-10-CM | POA: Diagnosis not present

## 2023-07-03 DIAGNOSIS — D631 Anemia in chronic kidney disease: Secondary | ICD-10-CM | POA: Diagnosis not present

## 2023-07-03 DIAGNOSIS — Z23 Encounter for immunization: Secondary | ICD-10-CM | POA: Diagnosis not present

## 2023-07-06 DIAGNOSIS — N186 End stage renal disease: Secondary | ICD-10-CM | POA: Diagnosis not present

## 2023-07-06 DIAGNOSIS — Z992 Dependence on renal dialysis: Secondary | ICD-10-CM | POA: Diagnosis not present

## 2023-07-06 DIAGNOSIS — N2581 Secondary hyperparathyroidism of renal origin: Secondary | ICD-10-CM | POA: Diagnosis not present

## 2023-07-06 DIAGNOSIS — D631 Anemia in chronic kidney disease: Secondary | ICD-10-CM | POA: Diagnosis not present

## 2023-07-06 DIAGNOSIS — D689 Coagulation defect, unspecified: Secondary | ICD-10-CM | POA: Diagnosis not present

## 2023-07-06 DIAGNOSIS — Z23 Encounter for immunization: Secondary | ICD-10-CM | POA: Diagnosis not present

## 2023-07-08 DIAGNOSIS — D631 Anemia in chronic kidney disease: Secondary | ICD-10-CM | POA: Diagnosis not present

## 2023-07-08 DIAGNOSIS — Z992 Dependence on renal dialysis: Secondary | ICD-10-CM | POA: Diagnosis not present

## 2023-07-08 DIAGNOSIS — D689 Coagulation defect, unspecified: Secondary | ICD-10-CM | POA: Diagnosis not present

## 2023-07-08 DIAGNOSIS — N2581 Secondary hyperparathyroidism of renal origin: Secondary | ICD-10-CM | POA: Diagnosis not present

## 2023-07-08 DIAGNOSIS — N186 End stage renal disease: Secondary | ICD-10-CM | POA: Diagnosis not present

## 2023-07-08 DIAGNOSIS — Z23 Encounter for immunization: Secondary | ICD-10-CM | POA: Diagnosis not present

## 2023-07-10 DIAGNOSIS — D631 Anemia in chronic kidney disease: Secondary | ICD-10-CM | POA: Diagnosis not present

## 2023-07-10 DIAGNOSIS — Z23 Encounter for immunization: Secondary | ICD-10-CM | POA: Diagnosis not present

## 2023-07-10 DIAGNOSIS — N186 End stage renal disease: Secondary | ICD-10-CM | POA: Diagnosis not present

## 2023-07-10 DIAGNOSIS — N2581 Secondary hyperparathyroidism of renal origin: Secondary | ICD-10-CM | POA: Diagnosis not present

## 2023-07-10 DIAGNOSIS — Z992 Dependence on renal dialysis: Secondary | ICD-10-CM | POA: Diagnosis not present

## 2023-07-10 DIAGNOSIS — D689 Coagulation defect, unspecified: Secondary | ICD-10-CM | POA: Diagnosis not present

## 2023-07-13 DIAGNOSIS — Z23 Encounter for immunization: Secondary | ICD-10-CM | POA: Diagnosis not present

## 2023-07-13 DIAGNOSIS — N186 End stage renal disease: Secondary | ICD-10-CM | POA: Diagnosis not present

## 2023-07-13 DIAGNOSIS — D631 Anemia in chronic kidney disease: Secondary | ICD-10-CM | POA: Diagnosis not present

## 2023-07-13 DIAGNOSIS — D689 Coagulation defect, unspecified: Secondary | ICD-10-CM | POA: Diagnosis not present

## 2023-07-13 DIAGNOSIS — N2581 Secondary hyperparathyroidism of renal origin: Secondary | ICD-10-CM | POA: Diagnosis not present

## 2023-07-13 DIAGNOSIS — Z992 Dependence on renal dialysis: Secondary | ICD-10-CM | POA: Diagnosis not present

## 2023-07-15 DIAGNOSIS — D631 Anemia in chronic kidney disease: Secondary | ICD-10-CM | POA: Diagnosis not present

## 2023-07-15 DIAGNOSIS — D689 Coagulation defect, unspecified: Secondary | ICD-10-CM | POA: Diagnosis not present

## 2023-07-15 DIAGNOSIS — N186 End stage renal disease: Secondary | ICD-10-CM | POA: Diagnosis not present

## 2023-07-15 DIAGNOSIS — Z992 Dependence on renal dialysis: Secondary | ICD-10-CM | POA: Diagnosis not present

## 2023-07-15 DIAGNOSIS — N2581 Secondary hyperparathyroidism of renal origin: Secondary | ICD-10-CM | POA: Diagnosis not present

## 2023-07-15 DIAGNOSIS — Z23 Encounter for immunization: Secondary | ICD-10-CM | POA: Diagnosis not present

## 2023-07-17 DIAGNOSIS — N186 End stage renal disease: Secondary | ICD-10-CM | POA: Diagnosis not present

## 2023-07-17 DIAGNOSIS — D689 Coagulation defect, unspecified: Secondary | ICD-10-CM | POA: Diagnosis not present

## 2023-07-17 DIAGNOSIS — Z23 Encounter for immunization: Secondary | ICD-10-CM | POA: Diagnosis not present

## 2023-07-17 DIAGNOSIS — N2581 Secondary hyperparathyroidism of renal origin: Secondary | ICD-10-CM | POA: Diagnosis not present

## 2023-07-17 DIAGNOSIS — D631 Anemia in chronic kidney disease: Secondary | ICD-10-CM | POA: Diagnosis not present

## 2023-07-17 DIAGNOSIS — Z992 Dependence on renal dialysis: Secondary | ICD-10-CM | POA: Diagnosis not present

## 2023-07-18 DIAGNOSIS — N186 End stage renal disease: Secondary | ICD-10-CM | POA: Diagnosis not present

## 2023-07-18 DIAGNOSIS — I129 Hypertensive chronic kidney disease with stage 1 through stage 4 chronic kidney disease, or unspecified chronic kidney disease: Secondary | ICD-10-CM | POA: Diagnosis not present

## 2023-07-18 DIAGNOSIS — Z992 Dependence on renal dialysis: Secondary | ICD-10-CM | POA: Diagnosis not present

## 2023-07-20 ENCOUNTER — Emergency Department (HOSPITAL_COMMUNITY): Payer: 59

## 2023-07-20 ENCOUNTER — Other Ambulatory Visit: Payer: Self-pay

## 2023-07-20 ENCOUNTER — Emergency Department (HOSPITAL_COMMUNITY)
Admission: EM | Admit: 2023-07-20 | Discharge: 2023-07-20 | Disposition: A | Discharge status: Home / Self Care | Payer: 59 | Attending: Emergency Medicine | Admitting: Emergency Medicine

## 2023-07-20 ENCOUNTER — Encounter (HOSPITAL_COMMUNITY): Payer: Self-pay

## 2023-07-20 DIAGNOSIS — R0789 Other chest pain: Secondary | ICD-10-CM | POA: Diagnosis not present

## 2023-07-20 DIAGNOSIS — I700 Atherosclerosis of aorta: Secondary | ICD-10-CM | POA: Diagnosis not present

## 2023-07-20 DIAGNOSIS — D631 Anemia in chronic kidney disease: Secondary | ICD-10-CM | POA: Diagnosis not present

## 2023-07-20 DIAGNOSIS — R072 Precordial pain: Secondary | ICD-10-CM | POA: Insufficient documentation

## 2023-07-20 DIAGNOSIS — R079 Chest pain, unspecified: Secondary | ICD-10-CM | POA: Diagnosis not present

## 2023-07-20 DIAGNOSIS — N2581 Secondary hyperparathyroidism of renal origin: Secondary | ICD-10-CM | POA: Diagnosis not present

## 2023-07-20 DIAGNOSIS — Z992 Dependence on renal dialysis: Secondary | ICD-10-CM | POA: Diagnosis not present

## 2023-07-20 DIAGNOSIS — D689 Coagulation defect, unspecified: Secondary | ICD-10-CM | POA: Diagnosis not present

## 2023-07-20 DIAGNOSIS — N186 End stage renal disease: Secondary | ICD-10-CM | POA: Diagnosis not present

## 2023-07-20 DIAGNOSIS — E1322 Other specified diabetes mellitus with diabetic chronic kidney disease: Secondary | ICD-10-CM | POA: Diagnosis not present

## 2023-07-20 LAB — CBC
HCT: 31 % — ABNORMAL LOW (ref 36.0–46.0)
Hemoglobin: 9.8 g/dL — ABNORMAL LOW (ref 12.0–15.0)
MCH: 28.3 pg (ref 26.0–34.0)
MCHC: 31.6 g/dL (ref 30.0–36.0)
MCV: 89.6 fL (ref 80.0–100.0)
Platelets: 229 10*3/uL (ref 150–400)
RBC: 3.46 MIL/uL — ABNORMAL LOW (ref 3.87–5.11)
RDW: 14.1 % (ref 11.5–15.5)
WBC: 4.2 10*3/uL (ref 4.0–10.5)
nRBC: 0 % (ref 0.0–0.2)

## 2023-07-20 LAB — BASIC METABOLIC PANEL
Anion gap: 17 — ABNORMAL HIGH (ref 5–15)
BUN: 10 mg/dL (ref 8–23)
CO2: 29 mmol/L (ref 22–32)
Calcium: 8.2 mg/dL — ABNORMAL LOW (ref 8.9–10.3)
Chloride: 91 mmol/L — ABNORMAL LOW (ref 98–111)
Creatinine, Ser: 3.06 mg/dL — ABNORMAL HIGH (ref 0.44–1.00)
GFR, Estimated: 16 mL/min — ABNORMAL LOW (ref >60)
Glucose, Bld: 339 mg/dL — ABNORMAL HIGH (ref 70–99)
Potassium: 3.2 mmol/L — ABNORMAL LOW (ref 3.5–5.1)
Sodium: 137 mmol/L (ref 135–145)

## 2023-07-20 LAB — TROPONIN I (HIGH SENSITIVITY)
Troponin I (High Sensitivity): 9 ng/L (ref <18)
Troponin I (High Sensitivity): 9 ng/L (ref <18)

## 2023-07-20 NOTE — ED Provider Notes (Signed)
[place] EMERGENCY DEPARTMENT AT [facility] Provider Note   CSN: [id number] Arrival date & time: 07/20/23  1703     History Chief Complaint  Patient presents with   Chest Pain    HPI Tricia Huynh is a 67 y.o. female presenting for chief complaint chest pain. States that earlier today at approximately noon she had sharp substernal chest pain.  Symptoms have resolved completely by time of arrival.  Patient's recorded medical, surgical, social, medication list and allergies were reviewed in the Snapshot window as part of the initial history.   Review of Systems   Review of Systems  Constitutional:  Negative for chills and fever.  HENT:  Negative for ear pain and sore throat.   Eyes:  Negative for pain and visual disturbance.  Respiratory:  Negative for cough and shortness of breath.   Cardiovascular:  Positive for chest pain. Negative for palpitations.  Gastrointestinal:  Negative for abdominal pain and vomiting.  Genitourinary:  Negative for dysuria and hematuria.  Musculoskeletal:  Negative for arthralgias and back pain.  Skin:  Negative for color change and rash.  Neurological:  Negative for seizures and syncope.  All other systems reviewed and are negative.   Physical Exam Updated Vital Signs BP 128/68   Pulse 74   Temp 98 F (36.7 C) (Oral)   Resp 15   Ht 5\' 8"  (1.727 m)   Wt 52.2 kg   SpO2 100%   BMI 17.49 kg/m  Physical Exam Vitals and nursing note reviewed.  Constitutional:      General: She is not in acute distress.    Appearance: She is well-developed.  HENT:     Head: Normocephalic and atraumatic.  Eyes:     Conjunctiva/sclera: Conjunctivae normal.  Cardiovascular:     Rate and Rhythm: Normal rate and regular rhythm.     Heart sounds: No murmur heard. Pulmonary:     Effort: Pulmonary effort is normal. No respiratory distress.     Breath sounds: Normal breath sounds.  Abdominal:     General: There is no distension.      Palpations: Abdomen is soft.     Tenderness: There is no abdominal tenderness. There is no right CVA tenderness or left CVA tenderness.  Musculoskeletal:        General: No swelling or tenderness. Normal range of motion.     Cervical back: Neck supple.  Skin:    General: Skin is warm and dry.  Neurological:     General: No focal deficit present.     Mental Status: She is alert and oriented to person, place, and time. Mental status is at baseline.     Cranial Nerves: No cranial nerve deficit.      ED Course/ Medical Decision Making/ A&P    Procedures Procedures   Medications Ordered in ED Medications - No data to display Medical Decision Making: Tricia Huynh is a 67 y.o. female who presented to the ED today with chest pain, detailed above.  Patient placed on continuous vitals and telemetry monitoring while in ED which was reviewed periodically.  Complete initial physical exam performed, notably the patient was asymptomatic, hemodynamically stable no acute distress.   Reviewed and confirmed nursing documentation for past medical history, family history, social history.    Initial Assessment: With the patient's presentation of left-sided chest pain, most likely diagnosis is musculoskeletal chest pain versus GERD, although ACS remains on the differential. Other diagnoses were considered including (but not limited to)  pulmonary embolism, community-acquired pneumonia, aortic dissection, pneumothorax, underlying bony abnormality, anemia. These are considered less likely due to history of present illness and physical exam findings.    In particular, concerning pulmonary embolism: Patient is PERC positive for age and the they deny malignancy, recent surgery, history of DVT, or calf tenderness leading to a low risk Wells score. Aortic Dissection also reconsidered but seems less likely based on the location, quality, onset, and severity of symptoms in this case. Patient also has a lack of  underlying history of AD or TAA.  This is most consistent with an acute life/limb threatening illness complicated by underlying chronic conditions.   Initial Plan: Evaluate for ACS with delta troponin and EKG evaluated as below  Evaluate for dissection, bony abnormality, or pneumonia with chest x-ray and screening laboratory evaluation including CBC, BMP  Further evaluation for pulmonary embolism not indicated at this time based on patient's PERC and Wells score.  Further evaluation for Thoracic Aortic Dissection not indicated at this time based on patient's clinical history and PE findings.   Initial Study Results: EKG was reviewed independently. Rate, rhythm, axis, intervals all examined and without medically relevant abnormality. ST segments without concerns for elevations.    Laboratory  Delta troponin demonstrated NAA   CBC and BMP without obvious metabolic or inflammatory abnormalities requiring further evaluation   Radiology  DG Chest 2 View  Result Date: 07/20/2023 CLINICAL DATA:  CP EXAM: CHEST - 2 VIEW COMPARISON:  Chest x-ray 02/12/2023 FINDINGS: The heart and mediastinal contours are unchanged. Aortic calcification. No focal consolidation. No pulmonary edema. No pleural effusion. No pneumothorax. No acute osseous abnormality. IMPRESSION: 1. No active cardiopulmonary disease. 2.  Aortic Atherosclerosis (ICD10-I70.0). Electronically Signed   By: Tish Frederickson M.D.   On: 07/20/2023 19:05    Final Assessment and Plan: Observed emergency room for 4 and half hours. Symptoms remain completely resolved during the duration of my evaluation.  Negative serial troponins.  She has underlying increased risk but feels comfortable following up in the outpatient setting given her resolution of symptoms well appearance.  Patient discharged with no further acute events.   Disposition:  I have considered need for hospitalization, however, considering all of the above, I believe this patient is  stable for discharge at this time.  Patient/family educated about specific return precautions for given chief complaint and symptoms.  Patient/family educated about follow-up with PCP .     Patient/family expressed understanding of return precautions and need for follow-up. Patient spoken to regarding all imaging and laboratory results and appropriate follow up for these results. All education provided in verbal form with additional information in written form. Time was allowed for answering of patient questions. Patient discharged.    Emergency Department Medication Summary:   Medications - No data to display     Clinical Impression:  1. Chest pain, unspecified type      Discharge   Final Clinical Impression(s) / ED Diagnoses Final diagnoses:  Chest pain, unspecified type    Rx / DC Orders ED Discharge Orders     None         Glyn Ade, MD 07/20/23 2118

## 2023-07-20 NOTE — ED Triage Notes (Signed)
Pt arrives with c/o CP after her HD treatment today. Pt is a MWF HD pt. Per pt, she has not missed any treatments. Per pt, she did have an episode of hypotension today and they gave her some fluids before she left, but pt is unsure of how low her BP dropped. Pt endorses fatigue and lightheadedness.

## 2023-07-20 NOTE — ED Notes (Signed)
Patient verbalizes understanding of discharge instructions. Opportunity for questioning and answers were provided. Armband removed by staff, pt discharged from ED. Pt ambulatory to ED waiting room with steady gait.  

## 2023-07-22 DIAGNOSIS — E1322 Other specified diabetes mellitus with diabetic chronic kidney disease: Secondary | ICD-10-CM | POA: Diagnosis not present

## 2023-07-22 DIAGNOSIS — N2581 Secondary hyperparathyroidism of renal origin: Secondary | ICD-10-CM | POA: Diagnosis not present

## 2023-07-22 DIAGNOSIS — D631 Anemia in chronic kidney disease: Secondary | ICD-10-CM | POA: Diagnosis not present

## 2023-07-22 DIAGNOSIS — N186 End stage renal disease: Secondary | ICD-10-CM | POA: Diagnosis not present

## 2023-07-22 DIAGNOSIS — Z992 Dependence on renal dialysis: Secondary | ICD-10-CM | POA: Diagnosis not present

## 2023-07-22 DIAGNOSIS — D689 Coagulation defect, unspecified: Secondary | ICD-10-CM | POA: Diagnosis not present

## 2023-07-24 DIAGNOSIS — N2581 Secondary hyperparathyroidism of renal origin: Secondary | ICD-10-CM | POA: Diagnosis not present

## 2023-07-24 DIAGNOSIS — D689 Coagulation defect, unspecified: Secondary | ICD-10-CM | POA: Diagnosis not present

## 2023-07-24 DIAGNOSIS — E1322 Other specified diabetes mellitus with diabetic chronic kidney disease: Secondary | ICD-10-CM | POA: Diagnosis not present

## 2023-07-24 DIAGNOSIS — N186 End stage renal disease: Secondary | ICD-10-CM | POA: Diagnosis not present

## 2023-07-24 DIAGNOSIS — Z992 Dependence on renal dialysis: Secondary | ICD-10-CM | POA: Diagnosis not present

## 2023-07-24 DIAGNOSIS — D631 Anemia in chronic kidney disease: Secondary | ICD-10-CM | POA: Diagnosis not present

## 2023-07-27 DIAGNOSIS — E1322 Other specified diabetes mellitus with diabetic chronic kidney disease: Secondary | ICD-10-CM | POA: Diagnosis not present

## 2023-07-27 DIAGNOSIS — N2581 Secondary hyperparathyroidism of renal origin: Secondary | ICD-10-CM | POA: Diagnosis not present

## 2023-07-27 DIAGNOSIS — N186 End stage renal disease: Secondary | ICD-10-CM | POA: Diagnosis not present

## 2023-07-27 DIAGNOSIS — D631 Anemia in chronic kidney disease: Secondary | ICD-10-CM | POA: Diagnosis not present

## 2023-07-27 DIAGNOSIS — Z992 Dependence on renal dialysis: Secondary | ICD-10-CM | POA: Diagnosis not present

## 2023-07-27 DIAGNOSIS — D689 Coagulation defect, unspecified: Secondary | ICD-10-CM | POA: Diagnosis not present

## 2023-07-29 DIAGNOSIS — Z992 Dependence on renal dialysis: Secondary | ICD-10-CM | POA: Diagnosis not present

## 2023-07-29 DIAGNOSIS — N186 End stage renal disease: Secondary | ICD-10-CM | POA: Diagnosis not present

## 2023-07-29 DIAGNOSIS — E1322 Other specified diabetes mellitus with diabetic chronic kidney disease: Secondary | ICD-10-CM | POA: Diagnosis not present

## 2023-07-29 DIAGNOSIS — N2581 Secondary hyperparathyroidism of renal origin: Secondary | ICD-10-CM | POA: Diagnosis not present

## 2023-07-29 DIAGNOSIS — D689 Coagulation defect, unspecified: Secondary | ICD-10-CM | POA: Diagnosis not present

## 2023-07-29 DIAGNOSIS — D631 Anemia in chronic kidney disease: Secondary | ICD-10-CM | POA: Diagnosis not present

## 2023-07-30 ENCOUNTER — Emergency Department (HOSPITAL_COMMUNITY)
Admission: EM | Admit: 2023-07-30 | Discharge: 2023-07-30 | Disposition: A | Discharge status: Home / Self Care | Payer: 59 | Attending: Emergency Medicine | Admitting: Emergency Medicine

## 2023-07-30 ENCOUNTER — Encounter (HOSPITAL_COMMUNITY): Payer: Self-pay

## 2023-07-30 ENCOUNTER — Emergency Department (HOSPITAL_COMMUNITY): Payer: 59

## 2023-07-30 DIAGNOSIS — E1122 Type 2 diabetes mellitus with diabetic chronic kidney disease: Secondary | ICD-10-CM | POA: Diagnosis not present

## 2023-07-30 DIAGNOSIS — R1033 Periumbilical pain: Secondary | ICD-10-CM | POA: Insufficient documentation

## 2023-07-30 DIAGNOSIS — R1032 Left lower quadrant pain: Secondary | ICD-10-CM | POA: Diagnosis not present

## 2023-07-30 DIAGNOSIS — R1031 Right lower quadrant pain: Secondary | ICD-10-CM | POA: Insufficient documentation

## 2023-07-30 DIAGNOSIS — R6889 Other general symptoms and signs: Secondary | ICD-10-CM | POA: Diagnosis not present

## 2023-07-30 DIAGNOSIS — I499 Cardiac arrhythmia, unspecified: Secondary | ICD-10-CM | POA: Diagnosis not present

## 2023-07-30 DIAGNOSIS — Z794 Long term (current) use of insulin: Secondary | ICD-10-CM | POA: Insufficient documentation

## 2023-07-30 DIAGNOSIS — R109 Unspecified abdominal pain: Secondary | ICD-10-CM | POA: Diagnosis not present

## 2023-07-30 DIAGNOSIS — N186 End stage renal disease: Secondary | ICD-10-CM | POA: Diagnosis not present

## 2023-07-30 DIAGNOSIS — K838 Other specified diseases of biliary tract: Secondary | ICD-10-CM | POA: Diagnosis not present

## 2023-07-30 DIAGNOSIS — I509 Heart failure, unspecified: Secondary | ICD-10-CM | POA: Diagnosis not present

## 2023-07-30 DIAGNOSIS — R1030 Lower abdominal pain, unspecified: Secondary | ICD-10-CM

## 2023-07-30 DIAGNOSIS — Z992 Dependence on renal dialysis: Secondary | ICD-10-CM | POA: Diagnosis not present

## 2023-07-30 DIAGNOSIS — N281 Cyst of kidney, acquired: Secondary | ICD-10-CM | POA: Diagnosis not present

## 2023-07-30 DIAGNOSIS — Z743 Need for continuous supervision: Secondary | ICD-10-CM | POA: Diagnosis not present

## 2023-07-30 LAB — COMPREHENSIVE METABOLIC PANEL
ALT: 10 U/L (ref 0–44)
AST: 16 U/L (ref 15–41)
Albumin: 3.7 g/dL (ref 3.5–5.0)
Alkaline Phosphatase: 199 U/L — ABNORMAL HIGH (ref 38–126)
Anion gap: 17 — ABNORMAL HIGH (ref 5–15)
BUN: 15 mg/dL (ref 8–23)
CO2: 27 mmol/L (ref 22–32)
Calcium: 9.1 mg/dL (ref 8.9–10.3)
Chloride: 93 mmol/L — ABNORMAL LOW (ref 98–111)
Creatinine, Ser: 4.54 mg/dL — ABNORMAL HIGH (ref 0.44–1.00)
GFR, Estimated: 10 mL/min — ABNORMAL LOW (ref >60)
Glucose, Bld: 245 mg/dL — ABNORMAL HIGH (ref 70–99)
Potassium: 3.9 mmol/L (ref 3.5–5.1)
Sodium: 137 mmol/L (ref 135–145)
Total Bilirubin: 0.4 mg/dL (ref 0.3–1.2)
Total Protein: 8.3 g/dL — ABNORMAL HIGH (ref 6.5–8.1)

## 2023-07-30 LAB — CBC
HCT: 32.3 % — ABNORMAL LOW (ref 36.0–46.0)
Hemoglobin: 10.6 g/dL — ABNORMAL LOW (ref 12.0–15.0)
MCH: 29.9 pg (ref 26.0–34.0)
MCHC: 32.8 g/dL (ref 30.0–36.0)
MCV: 91 fL (ref 80.0–100.0)
Platelets: 241 10*3/uL (ref 150–400)
RBC: 3.55 MIL/uL — ABNORMAL LOW (ref 3.87–5.11)
RDW: 13.6 % (ref 11.5–15.5)
WBC: 5.7 10*3/uL (ref 4.0–10.5)
nRBC: 0 % (ref 0.0–0.2)

## 2023-07-30 LAB — LACTIC ACID, PLASMA
Lactic Acid, Venous: 2.2 mmol/L (ref 0.5–1.9)
Lactic Acid, Venous: 2.4 mmol/L (ref 0.5–1.9)

## 2023-07-30 LAB — I-STAT VENOUS BLOOD GAS, ED
Acid-Base Excess: 10 mmol/L — ABNORMAL HIGH (ref 0.0–2.0)
Bicarbonate: 32 mmol/L — ABNORMAL HIGH (ref 20.0–28.0)
Calcium, Ion: 0.95 mmol/L — ABNORMAL LOW (ref 1.15–1.40)
HCT: 31 % — ABNORMAL LOW (ref 36.0–46.0)
Hemoglobin: 10.5 g/dL — ABNORMAL LOW (ref 12.0–15.0)
O2 Saturation: 99 %
Potassium: 3.9 mmol/L (ref 3.5–5.1)
Sodium: 139 mmol/L (ref 135–145)
TCO2: 33 mmol/L — ABNORMAL HIGH (ref 22–32)
pCO2, Ven: 31.1 mmHg — ABNORMAL LOW (ref 44–60)
pH, Ven: 7.62 (ref 7.25–7.43)
pO2, Ven: 113 mmHg — ABNORMAL HIGH (ref 32–45)

## 2023-07-30 LAB — LIPASE, BLOOD: Lipase: 47 U/L (ref 11–51)

## 2023-07-30 MED ORDER — HYDROMORPHONE HCL 1 MG/ML IJ SOLN
1.0000 mg | Freq: Once | INTRAMUSCULAR | Status: AC
  Administered 2023-07-30: 1 mg via INTRAVENOUS
  Filled 2023-07-30: qty 1

## 2023-07-30 NOTE — ED Triage Notes (Signed)
Pt is coming in for abd pain that started last night and progressively worsening, medic reports the pain is present with palpation and if she moves her right leg. It is more localized to the right side of the abd. She is a dialysis patient as well and is compliant with treatment. No other complaints at this time. Medic reports she is hyperglycemic at 323

## 2023-07-30 NOTE — ED Provider Notes (Signed)
[place] EMERGENCY DEPARTMENT AT [facility] Provider Note   CSN: [id number] Arrival date & time: 07/30/23  1526     History  Chief Complaint  Patient presents with   Abdominal Pain    Tricia Huynh is a 67 y.o. female.  The history is provided by the patient and medical records. No language interpreter was used.  Abdominal Pain Pain location:  RLQ, LLQ and suprapubic Pain quality: aching and sharp   Pain radiates to:  Does not radiate Pain severity:  Severe Onset quality:  Gradual Duration:  2 days Timing:  Constant Progression:  Worsening Chronicity:  New Context: not trauma   Relieved by:  Nothing Associated symptoms: vaginal bleeding (for 7 months)   Associated symptoms: no chest pain, no chills, no constipation, no cough, no diarrhea, no dysuria, no fatigue, no fever, no nausea, no shortness of breath, no vaginal discharge and no vomiting        Home Medications Prior to Admission medications   Medication Sig Start Date End Date Taking? Authorizing Provider  albuterol (VENTOLIN HFA) 108 (90 Base) MCG/ACT inhaler Inhale 1-2 puffs into the lungs every 6 (six) hours as needed for wheezing or shortness of breath. 10/26/22   Derwood Kaplan, MD  amLODipine (NORVASC) 10 MG tablet Take 1 tablet (10 mg total) by mouth daily. 09/21/22 10/21/22  Lanae Boast, MD  atorvastatin (LIPITOR) 40 MG tablet Take 40 mg by mouth daily. 12/23/19   [provider]  carvedilol (COREG) 25 MG tablet Take 1 tablet (25 mg total) by mouth 2 (two) times daily with a meal. 09/21/22 10/21/22  Lanae Boast, MD  Continuous Blood Gluc Sensor (FREESTYLE LIBRE 3 SENSOR) MISC 1 each by Does not apply route 2 (two) times a week. Place 1 sensor on the skin every 14 days. Use to check glucose continuously 09/22/22   Lanae Boast, MD  ferrous sulfate 325 (65 FE) MG tablet Take 1 tablet (325 mg total) by mouth daily with breakfast. 05/02/22   Marguerita Merles Latif, DO  gabapentin (NEURONTIN) 100 MG  capsule Take 1 capsule (100 mg total) by mouth at bedtime. 07/28/22   Danford, Earl Lites, MD  hydrALAZINE (APRESOLINE) 50 MG tablet Take 50 mg by mouth 3 (three) times daily.    [provider]  insulin glargine (LANTUS SOLOSTAR) 100 UNIT/ML Solostar Pen Inject 5 Units into the skin daily. Discard pen after 28 days from first use 02/12/23   Ghimire, Werner Lean, MD  Insulin Pen Needle (TECHLITE PEN NEEDLES) 32G X 4 MM MISC Use to inject lantus once a day 07/28/22   Danford, Earl Lites, MD  Vitamin D, Ergocalciferol, (DRISDOL) 1.25 MG (50000 UNIT) CAPS capsule Take 50,000 Units by mouth every 7 (seven) days.    [provider]      Allergies    Percocet [oxycodone-acetaminophen] and Vicodin [hydrocodone-acetaminophen]    Review of Systems   Review of Systems  Constitutional:  Negative for chills, fatigue and fever.  HENT:  Negative for congestion.   Respiratory:  Negative for cough, chest tightness and shortness of breath.   Cardiovascular:  Negative for chest pain, palpitations and leg swelling.  Gastrointestinal:  Positive for abdominal pain. Negative for constipation, diarrhea, nausea and vomiting.  Genitourinary:  Positive for vaginal bleeding (for 7 months). Negative for dysuria, flank pain, pelvic pain, vaginal discharge and vaginal pain.  Musculoskeletal:  Negative for neck pain and neck stiffness.  Skin:  Negative for rash and wound.  Neurological:  Negative  for headaches.  Psychiatric/Behavioral:  Negative for agitation.   All other systems reviewed and are negative.   Physical Exam Updated Vital Signs BP (!) 189/102   Pulse 94   Temp 98.2 F (36.8 C)   Resp 15   SpO2 100%  Physical Exam Vitals and nursing note reviewed.  Constitutional:      General: She is not in acute distress.    Appearance: She is well-developed. She is ill-appearing. She is not toxic-appearing or diaphoretic.  HENT:     Head: Normocephalic and atraumatic.     Mouth/Throat:      Mouth: Mucous membranes are moist.  Eyes:     Conjunctiva/sclera: Conjunctivae normal.  Cardiovascular:     Rate and Rhythm: Normal rate and regular rhythm.     Heart sounds: No murmur heard. Pulmonary:     Effort: Pulmonary effort is normal. No respiratory distress.     Breath sounds: Normal breath sounds. No wheezing, rhonchi or rales.  Chest:     Chest wall: No tenderness.  Abdominal:     General: Abdomen is flat. Bowel sounds are normal.     Palpations: Abdomen is soft.     Tenderness: There is abdominal tenderness in the right lower quadrant, periumbilical area, suprapubic area and left lower quadrant. There is no right CVA tenderness or left CVA tenderness.  Musculoskeletal:        General: No swelling.     Cervical back: Neck supple.  Skin:    General: Skin is warm and dry.     Capillary Refill: Capillary refill takes less than 2 seconds.     Coloration: Skin is not pale.     Findings: No rash.  Neurological:     General: No focal deficit present.     Mental Status: She is alert.  Psychiatric:        Mood and Affect: Mood normal.     ED Results / Procedures / Treatments   Labs (all labs ordered are listed, but only abnormal results are displayed) Labs Reviewed  COMPREHENSIVE METABOLIC PANEL - Abnormal; Notable for the following components:      Result Value   Chloride 93 (*)    Glucose, Bld 245 (*)    Creatinine, Ser 4.54 (*)    Total Protein 8.3 (*)    Alkaline Phosphatase 199 (*)    GFR, Estimated 10 (*)    Anion gap 17 (*)    All other components within normal limits  CBC - Abnormal; Notable for the following components:   RBC 3.55 (*)    Hemoglobin 10.6 (*)    HCT 32.3 (*)    All other components within normal limits  LACTIC ACID, PLASMA - Abnormal; Notable for the following components:   Lactic Acid, Venous 2.2 (*)    All other components within normal limits  LACTIC ACID, PLASMA - Abnormal; Notable for the following components:   Lactic Acid, Venous  2.4 (*)    All other components within normal limits  URINE CULTURE  LIPASE, BLOOD  URINALYSIS, ROUTINE W REFLEX MICROSCOPIC  I-STAT VENOUS BLOOD GAS, ED    EKG None  Radiology CT ABDOMEN PELVIS WO CONTRAST  Result Date: 07/30/2023 CLINICAL DATA:  Right lower quadrant abdominal pain, chronic vaginal bleeding EXAM: CT ABDOMEN AND PELVIS WITHOUT CONTRAST TECHNIQUE: Multidetector CT imaging of the abdomen and pelvis was performed following the standard protocol without IV contrast. RADIATION DOSE REDUCTION: This exam was performed according to the departmental dose-optimization program which  includes automated exposure control, adjustment of the mA and/or kV according to patient size and/or use of iterative reconstruction technique. COMPARISON:  04/27/2022 FINDINGS: Lower chest: Mild bibasilar atelectasis. Hepatobiliary: Unenhanced liver is unremarkable. Layering gallbladder sludge (series 2/image 29), without associated inflammatory changes. No intrahepatic or extrahepatic duct dilatation. Pancreas: Within normal limits. Spleen: Within normal limits Adrenals/Urinary Tract: Adrenal glands are within normal limits. 10 mm hemorrhagic cyst in the anterior right lower kidney (series 2/image 35), benign (Bosniak II). No follow-up recommended. Left kidney is within normal limits. No renal, ureteral, or bladder calculi. No hydronephrosis. Bladder is within normal limits. Stomach/Bowel: Stomach is within normal limits. No evidence of bowel obstruction. Normal appendix (series 2/image 61). No colonic wall thickening or inflammatory changes. Vascular/Lymphatic: No evidence of abdominal aortic aneurysm. No suspicious abdominopelvic lymphadenopathy. Reproductive: Status post hysterectomy. Left ovary is within normal limits.  No right adnexal mass. Other: No abdominopelvic ascites. Musculoskeletal: Visualized osseous structures are within normal limits. IMPRESSION: No CT findings to account for the patient's right  lower quadrant pain. Normal appendix. Status post hysterectomy. Electronically Signed   By: Charline Bills M.D.   On: 07/30/2023 19:39   US PELVIS (TRANSABDOMINAL ONLY)  Result Date: 07/30/2023 CLINICAL DATA:  History of prior hysterectomy presenting with vaginal bleeding. EXAM: TRANSABDOMINAL ULTRASOUND OF PELVIS TECHNIQUE: Transabdominal ultrasound examination of the pelvis was performed. Transabdominal technique was performed for global imaging of the pelvis including uterus, ovaries, adnexal regions, and pelvic cul-de-sac. It should be noted that the transvaginal portion of the study was refused by the patient. COMPARISON:  February 01, 2019 FINDINGS: Uterus The uterus is surgically absent. Endometrium Thickness: N/A. Right ovary The right ovary is not visualized. Left ovary The left ovary is not visualized. Other findings No abnormal free fluid. IMPRESSION: 1. Findings consistent with history of prior hysterectomy. 2. Nonvisualization of the ovaries. Electronically Signed   By: Aram Candela M.D.   On: 07/30/2023 19:28    Procedures Procedures    Medications Ordered in ED Medications  HYDROmorphone (DILAUDID) injection 1 mg (1 mg Intravenous Given 07/30/23 1628)    ED Course/ Medical Decision Making/ A&P                                 Medical Decision Making Amount and/or Complexity of Data Reviewed Labs: ordered. Radiology: ordered.  Risk Prescription drug management.    Tricia Huynh is a 67 y.o. female with a past medical history significant for ESRD on dialysis MWF, diabetes with previous DKA, neuropathies, previous colitis, verbal report of previous diverticulitis, dyslipidemia, CHF, previous pneumonia, and previous hysterectomy who presents with abdominal pain.  According to patient, she started having some pain yesterday that she thought was gas but then today the pain worsened.  She was tearful with pain primarily across her lower abdomen but in the right lower  quadrant specifically.  She denies any constipation or diarrhea.  She denies any dysuria but reports she does still make some urine.  She denies any trauma.  She did reports no constipation, diarrhea, fevers, chills, congestion, cough, nausea, or vomiting.  She pulmonologist has pain in her lower abdomen.  She reports she has had some vaginal bleeding ever since starting dialysis 7 months ago but it is not any different than it has been.  She denies any pelvic pain vaginal pain or vaginal discharge.  No other complaints reported.  On exam, lungs clear.  Lower abdomen  is tender to palpation primarily right lower quadrant.  Bowel sounds are appreciated.  Lungs clear.  Chest nontender.  Patient moving all extremities.    Given her amount of discomfort, we agreed to get a CT scan.  As she does still make urine, will hold on contrast for CT imaging.  A noncontrasted scan was ordered as was a pelvic ultrasound given her reported vaginal bleeding.  Ultrasound did not see ovaries and did not see uterus.  No other concerning findings seen.  CT scan did not show any acute abnormality or complication at this time.  On reassessment, pain is totally gone and she has no nausea, vomiting, or other complaints.  She has not mailed make urine for [place] but she reports it happens sometimes.  She otherwise is resting.  We went through all of her workup together and she did have a lactic acid that did slightly rise but otherwise she had similar anemia to prior, no leukocytosis, and metabolic panel similar to prior with elevated creatinine.  Lipase not elevated.  We did offer to do pelvic exam given the vaginal bleeding but as this is more chronic, she did not want pelvic ultrasound from a transvaginal standpoint and did not want pelvic exam at this time.  As she has resolution of symptoms, she would like to go home.  Will make sure she can eat and drink something.  Patient agrees with plan to discharge without urinalysis for  outpatient follow-up.  If any symptoms were to change or worsen acutely, we recommended she return for reevaluation and further management including getting urinalysis, likely pelvic exam, and further workup.   Patient passed p.o. challenge and would like to go home.  Patient discharged in good condition.          Final Clinical Impression(s) / ED Diagnoses Final diagnoses:  Lower abdominal pain    Rx / DC Orders ED Discharge Orders     None       Clinical Impression: 1. Lower abdominal pain     Disposition: Discharge  Condition: Good  I have discussed the results, Dx and Tx plan with the pt(& family if present). He/she/they expressed understanding and agree(s) with the plan. Discharge instructions discussed at great length. Strict return precautions discussed and pt &/or family have verbalized understanding of the instructions. No further questions at time of discharge.    New Prescriptions   No medications on file    Follow Up: Mila Palmer, MD [address] [place] [address] [phone]     [facility] Emergency Department at [facility] [address] [place] [address] [phone]        Delson Dulworth, Canary Brim, MD 07/30/23 2204

## 2023-07-30 NOTE — Discharge Instructions (Signed)
Your history, exam, and workup today did not show an acute surgical problem of your abdominal pain with no evidence of appendicitis diverticulitis, or obstruction.  The ultrasound also did not show concerning finding but did not see the ovaries.  With your report of months of bleeding, we do feel it is reasonable to follow-up with an OB/GYN or your PCP as you did not want further pelvic ultrasound or pelvic exam today here.  Please rest and stay hydrated and follow-up.  If any symptoms change or worsen acutely, please return to the nearest emergency department.

## 2023-07-31 DIAGNOSIS — E1322 Other specified diabetes mellitus with diabetic chronic kidney disease: Secondary | ICD-10-CM | POA: Diagnosis not present

## 2023-07-31 DIAGNOSIS — D631 Anemia in chronic kidney disease: Secondary | ICD-10-CM | POA: Diagnosis not present

## 2023-07-31 DIAGNOSIS — Z992 Dependence on renal dialysis: Secondary | ICD-10-CM | POA: Diagnosis not present

## 2023-07-31 DIAGNOSIS — N2581 Secondary hyperparathyroidism of renal origin: Secondary | ICD-10-CM | POA: Diagnosis not present

## 2023-07-31 DIAGNOSIS — D689 Coagulation defect, unspecified: Secondary | ICD-10-CM | POA: Diagnosis not present

## 2023-07-31 DIAGNOSIS — N186 End stage renal disease: Secondary | ICD-10-CM | POA: Diagnosis not present

## 2023-08-03 DIAGNOSIS — N186 End stage renal disease: Secondary | ICD-10-CM | POA: Diagnosis not present

## 2023-08-03 DIAGNOSIS — Z992 Dependence on renal dialysis: Secondary | ICD-10-CM | POA: Diagnosis not present

## 2023-08-03 DIAGNOSIS — D631 Anemia in chronic kidney disease: Secondary | ICD-10-CM | POA: Diagnosis not present

## 2023-08-03 DIAGNOSIS — D689 Coagulation defect, unspecified: Secondary | ICD-10-CM | POA: Diagnosis not present

## 2023-08-03 DIAGNOSIS — N2581 Secondary hyperparathyroidism of renal origin: Secondary | ICD-10-CM | POA: Diagnosis not present

## 2023-08-03 DIAGNOSIS — E1322 Other specified diabetes mellitus with diabetic chronic kidney disease: Secondary | ICD-10-CM | POA: Diagnosis not present

## 2023-08-05 DIAGNOSIS — Z992 Dependence on renal dialysis: Secondary | ICD-10-CM | POA: Diagnosis not present

## 2023-08-05 DIAGNOSIS — N2581 Secondary hyperparathyroidism of renal origin: Secondary | ICD-10-CM | POA: Diagnosis not present

## 2023-08-05 DIAGNOSIS — D689 Coagulation defect, unspecified: Secondary | ICD-10-CM | POA: Diagnosis not present

## 2023-08-05 DIAGNOSIS — N186 End stage renal disease: Secondary | ICD-10-CM | POA: Diagnosis not present

## 2023-08-05 DIAGNOSIS — E1322 Other specified diabetes mellitus with diabetic chronic kidney disease: Secondary | ICD-10-CM | POA: Diagnosis not present

## 2023-08-05 DIAGNOSIS — D631 Anemia in chronic kidney disease: Secondary | ICD-10-CM | POA: Diagnosis not present

## 2023-08-07 DIAGNOSIS — Z992 Dependence on renal dialysis: Secondary | ICD-10-CM | POA: Diagnosis not present

## 2023-08-07 DIAGNOSIS — E1322 Other specified diabetes mellitus with diabetic chronic kidney disease: Secondary | ICD-10-CM | POA: Diagnosis not present

## 2023-08-07 DIAGNOSIS — D689 Coagulation defect, unspecified: Secondary | ICD-10-CM | POA: Diagnosis not present

## 2023-08-07 DIAGNOSIS — D631 Anemia in chronic kidney disease: Secondary | ICD-10-CM | POA: Diagnosis not present

## 2023-08-07 DIAGNOSIS — N2581 Secondary hyperparathyroidism of renal origin: Secondary | ICD-10-CM | POA: Diagnosis not present

## 2023-08-07 DIAGNOSIS — N186 End stage renal disease: Secondary | ICD-10-CM | POA: Diagnosis not present

## 2023-08-10 DIAGNOSIS — D631 Anemia in chronic kidney disease: Secondary | ICD-10-CM | POA: Diagnosis not present

## 2023-08-10 DIAGNOSIS — Z992 Dependence on renal dialysis: Secondary | ICD-10-CM | POA: Diagnosis not present

## 2023-08-10 DIAGNOSIS — E1322 Other specified diabetes mellitus with diabetic chronic kidney disease: Secondary | ICD-10-CM | POA: Diagnosis not present

## 2023-08-10 DIAGNOSIS — N186 End stage renal disease: Secondary | ICD-10-CM | POA: Diagnosis not present

## 2023-08-10 DIAGNOSIS — D689 Coagulation defect, unspecified: Secondary | ICD-10-CM | POA: Diagnosis not present

## 2023-08-10 DIAGNOSIS — N2581 Secondary hyperparathyroidism of renal origin: Secondary | ICD-10-CM | POA: Diagnosis not present

## 2023-08-12 DIAGNOSIS — N2581 Secondary hyperparathyroidism of renal origin: Secondary | ICD-10-CM | POA: Diagnosis not present

## 2023-08-12 DIAGNOSIS — Z992 Dependence on renal dialysis: Secondary | ICD-10-CM | POA: Diagnosis not present

## 2023-08-12 DIAGNOSIS — D689 Coagulation defect, unspecified: Secondary | ICD-10-CM | POA: Diagnosis not present

## 2023-08-12 DIAGNOSIS — D631 Anemia in chronic kidney disease: Secondary | ICD-10-CM | POA: Diagnosis not present

## 2023-08-12 DIAGNOSIS — N186 End stage renal disease: Secondary | ICD-10-CM | POA: Diagnosis not present

## 2023-08-12 DIAGNOSIS — E1322 Other specified diabetes mellitus with diabetic chronic kidney disease: Secondary | ICD-10-CM | POA: Diagnosis not present

## 2023-08-14 DIAGNOSIS — Z992 Dependence on renal dialysis: Secondary | ICD-10-CM | POA: Diagnosis not present

## 2023-08-14 DIAGNOSIS — N2581 Secondary hyperparathyroidism of renal origin: Secondary | ICD-10-CM | POA: Diagnosis not present

## 2023-08-14 DIAGNOSIS — E1322 Other specified diabetes mellitus with diabetic chronic kidney disease: Secondary | ICD-10-CM | POA: Diagnosis not present

## 2023-08-14 DIAGNOSIS — N186 End stage renal disease: Secondary | ICD-10-CM | POA: Diagnosis not present

## 2023-08-14 DIAGNOSIS — D689 Coagulation defect, unspecified: Secondary | ICD-10-CM | POA: Diagnosis not present

## 2023-08-14 DIAGNOSIS — D631 Anemia in chronic kidney disease: Secondary | ICD-10-CM | POA: Diagnosis not present

## 2023-08-17 DIAGNOSIS — N186 End stage renal disease: Secondary | ICD-10-CM | POA: Diagnosis not present

## 2023-08-17 DIAGNOSIS — D689 Coagulation defect, unspecified: Secondary | ICD-10-CM | POA: Diagnosis not present

## 2023-08-17 DIAGNOSIS — E1322 Other specified diabetes mellitus with diabetic chronic kidney disease: Secondary | ICD-10-CM | POA: Diagnosis not present

## 2023-08-17 DIAGNOSIS — N2581 Secondary hyperparathyroidism of renal origin: Secondary | ICD-10-CM | POA: Diagnosis not present

## 2023-08-17 DIAGNOSIS — D631 Anemia in chronic kidney disease: Secondary | ICD-10-CM | POA: Diagnosis not present

## 2023-08-17 DIAGNOSIS — Z992 Dependence on renal dialysis: Secondary | ICD-10-CM | POA: Diagnosis not present

## 2023-08-17 DIAGNOSIS — I129 Hypertensive chronic kidney disease with stage 1 through stage 4 chronic kidney disease, or unspecified chronic kidney disease: Secondary | ICD-10-CM | POA: Diagnosis not present

## 2023-08-19 DIAGNOSIS — D631 Anemia in chronic kidney disease: Secondary | ICD-10-CM | POA: Diagnosis not present

## 2023-08-19 DIAGNOSIS — N2581 Secondary hyperparathyroidism of renal origin: Secondary | ICD-10-CM | POA: Diagnosis not present

## 2023-08-19 DIAGNOSIS — Z23 Encounter for immunization: Secondary | ICD-10-CM | POA: Diagnosis not present

## 2023-08-19 DIAGNOSIS — Z992 Dependence on renal dialysis: Secondary | ICD-10-CM | POA: Diagnosis not present

## 2023-08-19 DIAGNOSIS — N186 End stage renal disease: Secondary | ICD-10-CM | POA: Diagnosis not present

## 2023-08-19 DIAGNOSIS — D689 Coagulation defect, unspecified: Secondary | ICD-10-CM | POA: Diagnosis not present

## 2023-08-21 DIAGNOSIS — N186 End stage renal disease: Secondary | ICD-10-CM | POA: Diagnosis not present

## 2023-08-21 DIAGNOSIS — N2581 Secondary hyperparathyroidism of renal origin: Secondary | ICD-10-CM | POA: Diagnosis not present

## 2023-08-21 DIAGNOSIS — Z992 Dependence on renal dialysis: Secondary | ICD-10-CM | POA: Diagnosis not present

## 2023-08-21 DIAGNOSIS — D689 Coagulation defect, unspecified: Secondary | ICD-10-CM | POA: Diagnosis not present

## 2023-08-21 DIAGNOSIS — Z23 Encounter for immunization: Secondary | ICD-10-CM | POA: Diagnosis not present

## 2023-08-21 DIAGNOSIS — D631 Anemia in chronic kidney disease: Secondary | ICD-10-CM | POA: Diagnosis not present

## 2023-08-24 DIAGNOSIS — D689 Coagulation defect, unspecified: Secondary | ICD-10-CM | POA: Diagnosis not present

## 2023-08-24 DIAGNOSIS — Z992 Dependence on renal dialysis: Secondary | ICD-10-CM | POA: Diagnosis not present

## 2023-08-24 DIAGNOSIS — N186 End stage renal disease: Secondary | ICD-10-CM | POA: Diagnosis not present

## 2023-08-24 DIAGNOSIS — Z23 Encounter for immunization: Secondary | ICD-10-CM | POA: Diagnosis not present

## 2023-08-24 DIAGNOSIS — N2581 Secondary hyperparathyroidism of renal origin: Secondary | ICD-10-CM | POA: Diagnosis not present

## 2023-08-24 DIAGNOSIS — D631 Anemia in chronic kidney disease: Secondary | ICD-10-CM | POA: Diagnosis not present

## 2023-08-26 DIAGNOSIS — D631 Anemia in chronic kidney disease: Secondary | ICD-10-CM | POA: Diagnosis not present

## 2023-08-26 DIAGNOSIS — D689 Coagulation defect, unspecified: Secondary | ICD-10-CM | POA: Diagnosis not present

## 2023-08-26 DIAGNOSIS — N2581 Secondary hyperparathyroidism of renal origin: Secondary | ICD-10-CM | POA: Diagnosis not present

## 2023-08-26 DIAGNOSIS — Z23 Encounter for immunization: Secondary | ICD-10-CM | POA: Diagnosis not present

## 2023-08-26 DIAGNOSIS — N186 End stage renal disease: Secondary | ICD-10-CM | POA: Diagnosis not present

## 2023-08-26 DIAGNOSIS — Z992 Dependence on renal dialysis: Secondary | ICD-10-CM | POA: Diagnosis not present

## 2023-08-28 DIAGNOSIS — Z992 Dependence on renal dialysis: Secondary | ICD-10-CM | POA: Diagnosis not present

## 2023-08-28 DIAGNOSIS — N2581 Secondary hyperparathyroidism of renal origin: Secondary | ICD-10-CM | POA: Diagnosis not present

## 2023-08-28 DIAGNOSIS — N186 End stage renal disease: Secondary | ICD-10-CM | POA: Diagnosis not present

## 2023-08-28 DIAGNOSIS — D689 Coagulation defect, unspecified: Secondary | ICD-10-CM | POA: Diagnosis not present

## 2023-08-28 DIAGNOSIS — Z23 Encounter for immunization: Secondary | ICD-10-CM | POA: Diagnosis not present

## 2023-08-28 DIAGNOSIS — D631 Anemia in chronic kidney disease: Secondary | ICD-10-CM | POA: Diagnosis not present

## 2023-08-31 DIAGNOSIS — N186 End stage renal disease: Secondary | ICD-10-CM | POA: Diagnosis not present

## 2023-08-31 DIAGNOSIS — Z23 Encounter for immunization: Secondary | ICD-10-CM | POA: Diagnosis not present

## 2023-08-31 DIAGNOSIS — D631 Anemia in chronic kidney disease: Secondary | ICD-10-CM | POA: Diagnosis not present

## 2023-08-31 DIAGNOSIS — N2581 Secondary hyperparathyroidism of renal origin: Secondary | ICD-10-CM | POA: Diagnosis not present

## 2023-08-31 DIAGNOSIS — D689 Coagulation defect, unspecified: Secondary | ICD-10-CM | POA: Diagnosis not present

## 2023-08-31 DIAGNOSIS — Z992 Dependence on renal dialysis: Secondary | ICD-10-CM | POA: Diagnosis not present

## 2023-09-02 DIAGNOSIS — Z23 Encounter for immunization: Secondary | ICD-10-CM | POA: Diagnosis not present

## 2023-09-02 DIAGNOSIS — N186 End stage renal disease: Secondary | ICD-10-CM | POA: Diagnosis not present

## 2023-09-02 DIAGNOSIS — D631 Anemia in chronic kidney disease: Secondary | ICD-10-CM | POA: Diagnosis not present

## 2023-09-02 DIAGNOSIS — Z992 Dependence on renal dialysis: Secondary | ICD-10-CM | POA: Diagnosis not present

## 2023-09-02 DIAGNOSIS — D689 Coagulation defect, unspecified: Secondary | ICD-10-CM | POA: Diagnosis not present

## 2023-09-02 DIAGNOSIS — N2581 Secondary hyperparathyroidism of renal origin: Secondary | ICD-10-CM | POA: Diagnosis not present

## 2023-09-04 DIAGNOSIS — N186 End stage renal disease: Secondary | ICD-10-CM | POA: Diagnosis not present

## 2023-09-04 DIAGNOSIS — Z992 Dependence on renal dialysis: Secondary | ICD-10-CM | POA: Diagnosis not present

## 2023-09-04 DIAGNOSIS — Z23 Encounter for immunization: Secondary | ICD-10-CM | POA: Diagnosis not present

## 2023-09-04 DIAGNOSIS — D631 Anemia in chronic kidney disease: Secondary | ICD-10-CM | POA: Diagnosis not present

## 2023-09-04 DIAGNOSIS — D689 Coagulation defect, unspecified: Secondary | ICD-10-CM | POA: Diagnosis not present

## 2023-09-04 DIAGNOSIS — N2581 Secondary hyperparathyroidism of renal origin: Secondary | ICD-10-CM | POA: Diagnosis not present

## 2023-09-07 DIAGNOSIS — D689 Coagulation defect, unspecified: Secondary | ICD-10-CM | POA: Diagnosis not present

## 2023-09-07 DIAGNOSIS — Z992 Dependence on renal dialysis: Secondary | ICD-10-CM | POA: Diagnosis not present

## 2023-09-07 DIAGNOSIS — Z23 Encounter for immunization: Secondary | ICD-10-CM | POA: Diagnosis not present

## 2023-09-07 DIAGNOSIS — N186 End stage renal disease: Secondary | ICD-10-CM | POA: Diagnosis not present

## 2023-09-07 DIAGNOSIS — D631 Anemia in chronic kidney disease: Secondary | ICD-10-CM | POA: Diagnosis not present

## 2023-09-07 DIAGNOSIS — N2581 Secondary hyperparathyroidism of renal origin: Secondary | ICD-10-CM | POA: Diagnosis not present

## 2023-09-09 DIAGNOSIS — Z23 Encounter for immunization: Secondary | ICD-10-CM | POA: Diagnosis not present

## 2023-09-09 DIAGNOSIS — N2581 Secondary hyperparathyroidism of renal origin: Secondary | ICD-10-CM | POA: Diagnosis not present

## 2023-09-09 DIAGNOSIS — D689 Coagulation defect, unspecified: Secondary | ICD-10-CM | POA: Diagnosis not present

## 2023-09-09 DIAGNOSIS — Z992 Dependence on renal dialysis: Secondary | ICD-10-CM | POA: Diagnosis not present

## 2023-09-09 DIAGNOSIS — N186 End stage renal disease: Secondary | ICD-10-CM | POA: Diagnosis not present

## 2023-09-09 DIAGNOSIS — D631 Anemia in chronic kidney disease: Secondary | ICD-10-CM | POA: Diagnosis not present

## 2023-09-11 DIAGNOSIS — N186 End stage renal disease: Secondary | ICD-10-CM | POA: Diagnosis not present

## 2023-09-11 DIAGNOSIS — Z992 Dependence on renal dialysis: Secondary | ICD-10-CM | POA: Diagnosis not present

## 2023-09-11 DIAGNOSIS — Z23 Encounter for immunization: Secondary | ICD-10-CM | POA: Diagnosis not present

## 2023-09-11 DIAGNOSIS — D631 Anemia in chronic kidney disease: Secondary | ICD-10-CM | POA: Diagnosis not present

## 2023-09-11 DIAGNOSIS — D689 Coagulation defect, unspecified: Secondary | ICD-10-CM | POA: Diagnosis not present

## 2023-09-11 DIAGNOSIS — N2581 Secondary hyperparathyroidism of renal origin: Secondary | ICD-10-CM | POA: Diagnosis not present

## 2023-09-14 DIAGNOSIS — D631 Anemia in chronic kidney disease: Secondary | ICD-10-CM | POA: Diagnosis not present

## 2023-09-14 DIAGNOSIS — Z992 Dependence on renal dialysis: Secondary | ICD-10-CM | POA: Diagnosis not present

## 2023-09-14 DIAGNOSIS — N186 End stage renal disease: Secondary | ICD-10-CM | POA: Diagnosis not present

## 2023-09-14 DIAGNOSIS — D689 Coagulation defect, unspecified: Secondary | ICD-10-CM | POA: Diagnosis not present

## 2023-09-14 DIAGNOSIS — Z23 Encounter for immunization: Secondary | ICD-10-CM | POA: Diagnosis not present

## 2023-09-14 DIAGNOSIS — N2581 Secondary hyperparathyroidism of renal origin: Secondary | ICD-10-CM | POA: Diagnosis not present

## 2023-09-16 DIAGNOSIS — N2581 Secondary hyperparathyroidism of renal origin: Secondary | ICD-10-CM | POA: Diagnosis not present

## 2023-09-16 DIAGNOSIS — D631 Anemia in chronic kidney disease: Secondary | ICD-10-CM | POA: Diagnosis not present

## 2023-09-16 DIAGNOSIS — Z992 Dependence on renal dialysis: Secondary | ICD-10-CM | POA: Diagnosis not present

## 2023-09-16 DIAGNOSIS — Z23 Encounter for immunization: Secondary | ICD-10-CM | POA: Diagnosis not present

## 2023-09-16 DIAGNOSIS — N186 End stage renal disease: Secondary | ICD-10-CM | POA: Diagnosis not present

## 2023-09-16 DIAGNOSIS — D689 Coagulation defect, unspecified: Secondary | ICD-10-CM | POA: Diagnosis not present

## 2023-09-17 DIAGNOSIS — Z992 Dependence on renal dialysis: Secondary | ICD-10-CM | POA: Diagnosis not present

## 2023-09-17 DIAGNOSIS — N186 End stage renal disease: Secondary | ICD-10-CM | POA: Diagnosis not present

## 2023-09-17 DIAGNOSIS — I129 Hypertensive chronic kidney disease with stage 1 through stage 4 chronic kidney disease, or unspecified chronic kidney disease: Secondary | ICD-10-CM | POA: Diagnosis not present

## 2023-09-18 DIAGNOSIS — D689 Coagulation defect, unspecified: Secondary | ICD-10-CM | POA: Diagnosis not present

## 2023-09-18 DIAGNOSIS — Z23 Encounter for immunization: Secondary | ICD-10-CM | POA: Diagnosis not present

## 2023-09-18 DIAGNOSIS — N2581 Secondary hyperparathyroidism of renal origin: Secondary | ICD-10-CM | POA: Diagnosis not present

## 2023-09-18 DIAGNOSIS — N186 End stage renal disease: Secondary | ICD-10-CM | POA: Diagnosis not present

## 2023-09-18 DIAGNOSIS — Z992 Dependence on renal dialysis: Secondary | ICD-10-CM | POA: Diagnosis not present

## 2023-09-21 DIAGNOSIS — Z992 Dependence on renal dialysis: Secondary | ICD-10-CM | POA: Diagnosis not present

## 2023-09-21 DIAGNOSIS — D689 Coagulation defect, unspecified: Secondary | ICD-10-CM | POA: Diagnosis not present

## 2023-09-21 DIAGNOSIS — N2581 Secondary hyperparathyroidism of renal origin: Secondary | ICD-10-CM | POA: Diagnosis not present

## 2023-09-21 DIAGNOSIS — N186 End stage renal disease: Secondary | ICD-10-CM | POA: Diagnosis not present

## 2023-09-21 DIAGNOSIS — Z23 Encounter for immunization: Secondary | ICD-10-CM | POA: Diagnosis not present

## 2023-09-23 DIAGNOSIS — N2581 Secondary hyperparathyroidism of renal origin: Secondary | ICD-10-CM | POA: Diagnosis not present

## 2023-09-23 DIAGNOSIS — Z992 Dependence on renal dialysis: Secondary | ICD-10-CM | POA: Diagnosis not present

## 2023-09-23 DIAGNOSIS — N186 End stage renal disease: Secondary | ICD-10-CM | POA: Diagnosis not present

## 2023-09-23 DIAGNOSIS — D689 Coagulation defect, unspecified: Secondary | ICD-10-CM | POA: Diagnosis not present

## 2023-09-23 DIAGNOSIS — Z23 Encounter for immunization: Secondary | ICD-10-CM | POA: Diagnosis not present

## 2023-09-25 DIAGNOSIS — N2581 Secondary hyperparathyroidism of renal origin: Secondary | ICD-10-CM | POA: Diagnosis not present

## 2023-09-25 DIAGNOSIS — Z992 Dependence on renal dialysis: Secondary | ICD-10-CM | POA: Diagnosis not present

## 2023-09-25 DIAGNOSIS — Z23 Encounter for immunization: Secondary | ICD-10-CM | POA: Diagnosis not present

## 2023-09-25 DIAGNOSIS — D689 Coagulation defect, unspecified: Secondary | ICD-10-CM | POA: Diagnosis not present

## 2023-09-25 DIAGNOSIS — N186 End stage renal disease: Secondary | ICD-10-CM | POA: Diagnosis not present

## 2023-09-28 DIAGNOSIS — D689 Coagulation defect, unspecified: Secondary | ICD-10-CM | POA: Diagnosis not present

## 2023-09-28 DIAGNOSIS — Z23 Encounter for immunization: Secondary | ICD-10-CM | POA: Diagnosis not present

## 2023-09-28 DIAGNOSIS — Z992 Dependence on renal dialysis: Secondary | ICD-10-CM | POA: Diagnosis not present

## 2023-09-28 DIAGNOSIS — N2581 Secondary hyperparathyroidism of renal origin: Secondary | ICD-10-CM | POA: Diagnosis not present

## 2023-09-28 DIAGNOSIS — N186 End stage renal disease: Secondary | ICD-10-CM | POA: Diagnosis not present

## 2023-09-30 DIAGNOSIS — N186 End stage renal disease: Secondary | ICD-10-CM | POA: Diagnosis not present

## 2023-09-30 DIAGNOSIS — Z23 Encounter for immunization: Secondary | ICD-10-CM | POA: Diagnosis not present

## 2023-09-30 DIAGNOSIS — N2581 Secondary hyperparathyroidism of renal origin: Secondary | ICD-10-CM | POA: Diagnosis not present

## 2023-09-30 DIAGNOSIS — D689 Coagulation defect, unspecified: Secondary | ICD-10-CM | POA: Diagnosis not present

## 2023-09-30 DIAGNOSIS — Z992 Dependence on renal dialysis: Secondary | ICD-10-CM | POA: Diagnosis not present

## 2023-10-02 DIAGNOSIS — N186 End stage renal disease: Secondary | ICD-10-CM | POA: Diagnosis not present

## 2023-10-02 DIAGNOSIS — N2581 Secondary hyperparathyroidism of renal origin: Secondary | ICD-10-CM | POA: Diagnosis not present

## 2023-10-02 DIAGNOSIS — Z23 Encounter for immunization: Secondary | ICD-10-CM | POA: Diagnosis not present

## 2023-10-02 DIAGNOSIS — D689 Coagulation defect, unspecified: Secondary | ICD-10-CM | POA: Diagnosis not present

## 2023-10-02 DIAGNOSIS — Z992 Dependence on renal dialysis: Secondary | ICD-10-CM | POA: Diagnosis not present

## 2023-10-05 DIAGNOSIS — Z992 Dependence on renal dialysis: Secondary | ICD-10-CM | POA: Diagnosis not present

## 2023-10-05 DIAGNOSIS — D689 Coagulation defect, unspecified: Secondary | ICD-10-CM | POA: Diagnosis not present

## 2023-10-05 DIAGNOSIS — Z23 Encounter for immunization: Secondary | ICD-10-CM | POA: Diagnosis not present

## 2023-10-05 DIAGNOSIS — N2581 Secondary hyperparathyroidism of renal origin: Secondary | ICD-10-CM | POA: Diagnosis not present

## 2023-10-05 DIAGNOSIS — N186 End stage renal disease: Secondary | ICD-10-CM | POA: Diagnosis not present

## 2023-10-07 ENCOUNTER — Other Ambulatory Visit (HOSPITAL_COMMUNITY)
Admission: RE | Admit: 2023-10-07 | Discharge: 2023-10-07 | Disposition: A | Discharge status: Home / Self Care | Payer: 59 | Source: Ambulatory Visit | Attending: Obstetrics and Gynecology | Admitting: Obstetrics and Gynecology

## 2023-10-07 ENCOUNTER — Encounter: Payer: Self-pay | Admitting: Obstetrics and Gynecology

## 2023-10-07 ENCOUNTER — Ambulatory Visit: Payer: 59 | Admitting: Obstetrics and Gynecology

## 2023-10-07 VITALS — BP 205/92 | HR 75 | Ht 68.0 in | Wt 116.0 lb

## 2023-10-07 DIAGNOSIS — N760 Acute vaginitis: Secondary | ICD-10-CM

## 2023-10-07 DIAGNOSIS — N939 Abnormal uterine and vaginal bleeding, unspecified: Secondary | ICD-10-CM

## 2023-10-07 DIAGNOSIS — K6282 Dysplasia of anus: Secondary | ICD-10-CM | POA: Diagnosis not present

## 2023-10-07 DIAGNOSIS — B9689 Other specified bacterial agents as the cause of diseases classified elsewhere: Secondary | ICD-10-CM | POA: Insufficient documentation

## 2023-10-07 DIAGNOSIS — N898 Other specified noninflammatory disorders of vagina: Secondary | ICD-10-CM | POA: Diagnosis not present

## 2023-10-07 DIAGNOSIS — Z992 Dependence on renal dialysis: Secondary | ICD-10-CM | POA: Diagnosis not present

## 2023-10-07 DIAGNOSIS — N2581 Secondary hyperparathyroidism of renal origin: Secondary | ICD-10-CM | POA: Diagnosis not present

## 2023-10-07 DIAGNOSIS — N952 Postmenopausal atrophic vaginitis: Secondary | ICD-10-CM

## 2023-10-07 DIAGNOSIS — N186 End stage renal disease: Secondary | ICD-10-CM | POA: Diagnosis not present

## 2023-10-07 DIAGNOSIS — D689 Coagulation defect, unspecified: Secondary | ICD-10-CM | POA: Diagnosis not present

## 2023-10-07 DIAGNOSIS — Z23 Encounter for immunization: Secondary | ICD-10-CM | POA: Diagnosis not present

## 2023-10-07 MED ORDER — ESTRADIOL 0.1 MG/GM VA CREA: TOPICAL_CREAM | VAGINAL | 2 refills | Status: AC

## 2023-10-07 MED ORDER — METRONIDAZOLE 0.75 % VA GEL: 1.0000 | Freq: Every day | VAGINAL | 1 refills | Status: AC

## 2023-10-07 NOTE — Progress Notes (Signed)
Pt presents for AUB. Pt reports bleeding for several months. Hysterectomy 1999

## 2023-10-07 NOTE — Progress Notes (Signed)
GYNECOLOGY OFFICE NOTE  History:  67 y.o. G3P3000 here today for bleeding. She has noted dark blood on her pad since March. She notes that her stomach hurts sometimes as well. She has noted that she has had bleeding since she started dialysis in March. Happens nearly daily, more than spotting but not heavy bleeding. Does not happen with urination. She is not sure if bleeding is vaginal or rectal, has not seen anybody else.     Patient had abdominal hysterectomy in [place] in 1999 for heavy bleeding and never had to get pap smears after. Was told she did not need them.   History of AIN with excision with colorectal at [facility], appears to have been last seen 2023, states she is not following up with them anymore.   Past Medical History:  Diagnosis Date   Anemia of chronic kidney failure, stage 4 (severe) (HCC)    Chronic kidney disease    Diabetes mellitus type 2 in nonobese (HCC) 06/06/2015   Diabetes mellitus without complication (HCC)    Diabetic neuropathy (HCC) 06/06/2015   Dyslipidemia 06/06/2015   Encephalopathy    Essential hypertension 06/06/2015   Hypertension    Mood disorder (HCC) 06/06/2015   Pneumonia 03/11/2017   Seizure (HCC)    sees [place]. abd eeg, on keppra    Past Surgical History:  Procedure Laterality Date   ABDOMINAL HYSTERECTOMY     AV FISTULA PLACEMENT Left 02/12/2023   Procedure: LEFT ARM BRACHIOCEPHALIC ARTERIOVENOUS (AV) FISTULA CREATION;  Surgeon: Victorino Sparrow, MD;  Location: [facility] OR;  Service: Vascular;  Laterality: Left;   EYE SURGERY Bilateral    IR FLUORO GUIDE CV LINE RIGHT  02/06/2023   IR US GUIDE VASC ACCESS RIGHT  02/06/2023   TUBAL LIGATION       Current Outpatient Medications:    albuterol (VENTOLIN HFA) 108 (90 Base) MCG/ACT inhaler, Inhale 1-2 puffs into the lungs every 6 (six) hours as needed for wheezing or shortness of breath., Disp: 1 each, Rfl: 0   amLODipine (NORVASC) 10 MG tablet, Take 1 tablet (10 mg total) by mouth daily., Disp:  30 tablet, Rfl: 0   atorvastatin (LIPITOR) 40 MG tablet, Take 40 mg by mouth daily., Disp: , Rfl:    carvedilol (COREG) 25 MG tablet, Take 1 tablet (25 mg total) by mouth 2 (two) times daily with a meal., Disp: 60 tablet, Rfl: 0   Continuous Blood Gluc Sensor (FREESTYLE LIBRE 3 SENSOR) MISC, 1 each by Does not apply route 2 (two) times a week. Place 1 sensor on the skin every 14 days. Use to check glucose continuously, Disp: 2 each, Rfl: 1   estradiol (ESTRACE) 0.1 MG/GM vaginal cream, Apply 1 gram per vagina every night for 2 weeks, then apply two times a week, Disp: 30 g, Rfl: 2   gabapentin (NEURONTIN) 100 MG capsule, Take 1 capsule (100 mg total) by mouth at bedtime., Disp: 30 capsule, Rfl: 3   hydrALAZINE (APRESOLINE) 50 MG tablet, Take 50 mg by mouth 3 (three) times daily., Disp: , Rfl:    insulin glargine (LANTUS SOLOSTAR) 100 UNIT/ML Solostar Pen, Inject 5 Units into the skin daily. Discard pen after 28 days from first use, Disp: 5 mL, Rfl: 11   Insulin Pen Needle (TECHLITE PEN NEEDLES) 32G X 4 MM MISC, Use to inject lantus once a day, Disp: 100 each, Rfl: 11   metroNIDAZOLE (METROGEL) 0.75 % vaginal gel, Place 1 Applicatorful vaginally at bedtime. Apply one applicatorful to vagina at  bedtime for 5 days, Disp: 70 g, Rfl: 1   ferrous sulfate 325 (65 FE) MG tablet, Take 1 tablet (325 mg total) by mouth daily with breakfast. (Patient not taking: Reported on 10/07/2023), Disp: 30 tablet, Rfl: 0   Vitamin D, Ergocalciferol, (DRISDOL) 1.25 MG (50000 UNIT) CAPS capsule, Take 50,000 Units by mouth every 7 (seven) days. (Patient not taking: Reported on 10/07/2023), Disp: , Rfl:   The following portions of the patient's history were reviewed and updated as appropriate: allergies, current medications, past family history, past medical history, past social history, past surgical history and problem list.   Review of Systems:  Pertinent items noted in HPI and remainder of comprehensive ROS otherwise  negative.   Objective:  Physical Exam BP (!) 205/92   Pulse 75   Ht 5\' 8"  (1.727 m)   Wt 116 lb (52.6 kg)   BMI 17.64 kg/m  CONSTITUTIONAL: Well-developed, well-nourished female in no acute distress.  HENT:  Normocephalic, atraumatic. External right and left ear normal. Oropharynx is clear and moist EYES: Conjunctivae and EOM are normal.  NECK: Normal range of motion SKIN: Skin is warm and dry. No rash noted.  NEUROLOGIC: Alert and oriented to person, place, and time. Normal reflexes, muscle tone coordination. No cranial nerve deficit noted. PSYCHIATRIC: Normal mood and affect. Normal behavior. Normal judgment and thought content. CARDIOVASCULAR: Normal heart rate noted RESPIRATORY: Effort normal, no problems with respiration noted ABDOMEN: Soft, no distention noted.   PELVIC: Normal appearing external genitalia; normal appearing vaginal mucosa with dark brown discharge in vagina. 2 mm brown nodule at right upper vagina. Unable to visualize cuff well due to patient discomfort and significant amount of watery, gray discharge. Pelvic cultures obtained. Uterus surgically absent, moderate tenderness at cuff limiting exam.  MUSCULOSKELETAL: Normal range of motion. No edema noted.  Exam done with chaperone present.  Labs and Imaging No results found.  Assessment & Plan:  1. Abnormal uterine bleeding (AUB) Possible due to bacterial vaginosis, Rx sent today Will see if txt improves bleeding Will have return for biopsy of vaginal lesion and thorough exam/colpo of upper vagina once txtd and given vaginal estrogen to promote improvement of atrophy - Cervicovaginal ancillary only( [place])  2. Vaginal lesion Return for biopsy  3. AIN (anal intraepithelial neoplasia) anal canal Needs to follow up with colorectal  4. Bacterial vaginitis Rx sent - Cervicovaginal ancillary only( [place])  5. Vaginal atrophy Possible cause of bleeding, start estrace and return for  exam   Routine preventative health maintenance measures emphasized. Please refer to After Visit Summary for other counseling recommendations.   Return in about 6 weeks (around 11/18/2023) for vaginal lesions biopsy, colposcopy.   Baldemar Lenis, MD, [facility] Attending Center for [facility])

## 2023-10-08 LAB — CERVICOVAGINAL ANCILLARY ONLY
Bacterial Vaginitis (gardnerella): POSITIVE — AB
Candida Glabrata: NEGATIVE
Candida Vaginitis: NEGATIVE
Chlamydia: NEGATIVE
Comment: NEGATIVE
Comment: NEGATIVE
Comment: NEGATIVE
Comment: NEGATIVE
Comment: NEGATIVE
Comment: NORMAL
Neisseria Gonorrhea: NEGATIVE
Trichomonas: POSITIVE — AB

## 2023-10-09 ENCOUNTER — Other Ambulatory Visit: Payer: Self-pay | Admitting: Family Medicine

## 2023-10-09 ENCOUNTER — Other Ambulatory Visit (HOSPITAL_COMMUNITY): Payer: Self-pay

## 2023-10-09 DIAGNOSIS — D689 Coagulation defect, unspecified: Secondary | ICD-10-CM | POA: Diagnosis not present

## 2023-10-09 DIAGNOSIS — N2581 Secondary hyperparathyroidism of renal origin: Secondary | ICD-10-CM | POA: Diagnosis not present

## 2023-10-09 DIAGNOSIS — N186 End stage renal disease: Secondary | ICD-10-CM | POA: Diagnosis not present

## 2023-10-09 DIAGNOSIS — Z992 Dependence on renal dialysis: Secondary | ICD-10-CM | POA: Diagnosis not present

## 2023-10-09 DIAGNOSIS — Z23 Encounter for immunization: Secondary | ICD-10-CM | POA: Diagnosis not present

## 2023-10-09 MED ORDER — METRONIDAZOLE 500 MG PO TABS
500.0000 mg | ORAL_TABLET | Freq: Two times a day (BID) | ORAL | 0 refills | Status: AC
Start: 2023-10-09 — End: 2023-10-16
  Filled 2023-10-09: qty 14, 7d supply, fill #0

## 2023-10-11 DIAGNOSIS — Z23 Encounter for immunization: Secondary | ICD-10-CM | POA: Diagnosis not present

## 2023-10-11 DIAGNOSIS — N2581 Secondary hyperparathyroidism of renal origin: Secondary | ICD-10-CM | POA: Diagnosis not present

## 2023-10-11 DIAGNOSIS — N186 End stage renal disease: Secondary | ICD-10-CM | POA: Diagnosis not present

## 2023-10-11 DIAGNOSIS — D689 Coagulation defect, unspecified: Secondary | ICD-10-CM | POA: Diagnosis not present

## 2023-10-11 DIAGNOSIS — Z992 Dependence on renal dialysis: Secondary | ICD-10-CM | POA: Diagnosis not present

## 2023-10-12 ENCOUNTER — Encounter: Payer: Self-pay | Admitting: *Deleted

## 2023-10-12 NOTE — Progress Notes (Signed)
2nd attempt to contact pt. Error message stating that call can not be completed at this time received. Letter sent to pt address on file.

## 2023-10-13 DIAGNOSIS — D689 Coagulation defect, unspecified: Secondary | ICD-10-CM | POA: Diagnosis not present

## 2023-10-13 DIAGNOSIS — Z992 Dependence on renal dialysis: Secondary | ICD-10-CM | POA: Diagnosis not present

## 2023-10-13 DIAGNOSIS — Z23 Encounter for immunization: Secondary | ICD-10-CM | POA: Diagnosis not present

## 2023-10-13 DIAGNOSIS — N186 End stage renal disease: Secondary | ICD-10-CM | POA: Diagnosis not present

## 2023-10-13 DIAGNOSIS — N2581 Secondary hyperparathyroidism of renal origin: Secondary | ICD-10-CM | POA: Diagnosis not present

## 2023-10-16 DIAGNOSIS — Z23 Encounter for immunization: Secondary | ICD-10-CM | POA: Diagnosis not present

## 2023-10-16 DIAGNOSIS — N2581 Secondary hyperparathyroidism of renal origin: Secondary | ICD-10-CM | POA: Diagnosis not present

## 2023-10-16 DIAGNOSIS — Z992 Dependence on renal dialysis: Secondary | ICD-10-CM | POA: Diagnosis not present

## 2023-10-16 DIAGNOSIS — N186 End stage renal disease: Secondary | ICD-10-CM | POA: Diagnosis not present

## 2023-10-16 DIAGNOSIS — D689 Coagulation defect, unspecified: Secondary | ICD-10-CM | POA: Diagnosis not present

## 2023-10-17 DIAGNOSIS — Z992 Dependence on renal dialysis: Secondary | ICD-10-CM | POA: Diagnosis not present

## 2023-10-17 DIAGNOSIS — I129 Hypertensive chronic kidney disease with stage 1 through stage 4 chronic kidney disease, or unspecified chronic kidney disease: Secondary | ICD-10-CM | POA: Diagnosis not present

## 2023-10-17 DIAGNOSIS — N186 End stage renal disease: Secondary | ICD-10-CM | POA: Diagnosis not present

## 2023-10-19 ENCOUNTER — Other Ambulatory Visit (HOSPITAL_COMMUNITY): Payer: Self-pay

## 2023-10-19 DIAGNOSIS — D689 Coagulation defect, unspecified: Secondary | ICD-10-CM | POA: Diagnosis not present

## 2023-10-19 DIAGNOSIS — N2581 Secondary hyperparathyroidism of renal origin: Secondary | ICD-10-CM | POA: Diagnosis not present

## 2023-10-19 DIAGNOSIS — N186 End stage renal disease: Secondary | ICD-10-CM | POA: Diagnosis not present

## 2023-10-19 DIAGNOSIS — Z992 Dependence on renal dialysis: Secondary | ICD-10-CM | POA: Diagnosis not present

## 2023-10-21 DIAGNOSIS — N2581 Secondary hyperparathyroidism of renal origin: Secondary | ICD-10-CM | POA: Diagnosis not present

## 2023-10-21 DIAGNOSIS — D689 Coagulation defect, unspecified: Secondary | ICD-10-CM | POA: Diagnosis not present

## 2023-10-21 DIAGNOSIS — Z992 Dependence on renal dialysis: Secondary | ICD-10-CM | POA: Diagnosis not present

## 2023-10-21 DIAGNOSIS — N186 End stage renal disease: Secondary | ICD-10-CM | POA: Diagnosis not present

## 2023-10-23 DIAGNOSIS — N2581 Secondary hyperparathyroidism of renal origin: Secondary | ICD-10-CM | POA: Diagnosis not present

## 2023-10-23 DIAGNOSIS — N186 End stage renal disease: Secondary | ICD-10-CM | POA: Diagnosis not present

## 2023-10-23 DIAGNOSIS — D689 Coagulation defect, unspecified: Secondary | ICD-10-CM | POA: Diagnosis not present

## 2023-10-23 DIAGNOSIS — Z992 Dependence on renal dialysis: Secondary | ICD-10-CM | POA: Diagnosis not present

## 2023-10-26 DIAGNOSIS — D689 Coagulation defect, unspecified: Secondary | ICD-10-CM | POA: Diagnosis not present

## 2023-10-26 DIAGNOSIS — Z992 Dependence on renal dialysis: Secondary | ICD-10-CM | POA: Diagnosis not present

## 2023-10-26 DIAGNOSIS — N186 End stage renal disease: Secondary | ICD-10-CM | POA: Diagnosis not present

## 2023-10-26 DIAGNOSIS — N2581 Secondary hyperparathyroidism of renal origin: Secondary | ICD-10-CM | POA: Diagnosis not present

## 2023-10-28 DIAGNOSIS — N186 End stage renal disease: Secondary | ICD-10-CM | POA: Diagnosis not present

## 2023-10-28 DIAGNOSIS — D689 Coagulation defect, unspecified: Secondary | ICD-10-CM | POA: Diagnosis not present

## 2023-10-28 DIAGNOSIS — N2581 Secondary hyperparathyroidism of renal origin: Secondary | ICD-10-CM | POA: Diagnosis not present

## 2023-10-28 DIAGNOSIS — Z992 Dependence on renal dialysis: Secondary | ICD-10-CM | POA: Diagnosis not present

## 2023-10-30 DIAGNOSIS — Z992 Dependence on renal dialysis: Secondary | ICD-10-CM | POA: Diagnosis not present

## 2023-10-30 DIAGNOSIS — N186 End stage renal disease: Secondary | ICD-10-CM | POA: Diagnosis not present

## 2023-10-30 DIAGNOSIS — N2581 Secondary hyperparathyroidism of renal origin: Secondary | ICD-10-CM | POA: Diagnosis not present

## 2023-10-30 DIAGNOSIS — D689 Coagulation defect, unspecified: Secondary | ICD-10-CM | POA: Diagnosis not present

## 2023-11-02 DIAGNOSIS — D689 Coagulation defect, unspecified: Secondary | ICD-10-CM | POA: Diagnosis not present

## 2023-11-02 DIAGNOSIS — Z992 Dependence on renal dialysis: Secondary | ICD-10-CM | POA: Diagnosis not present

## 2023-11-02 DIAGNOSIS — N2581 Secondary hyperparathyroidism of renal origin: Secondary | ICD-10-CM | POA: Diagnosis not present

## 2023-11-02 DIAGNOSIS — N186 End stage renal disease: Secondary | ICD-10-CM | POA: Diagnosis not present

## 2023-11-04 DIAGNOSIS — D689 Coagulation defect, unspecified: Secondary | ICD-10-CM | POA: Diagnosis not present

## 2023-11-04 DIAGNOSIS — N186 End stage renal disease: Secondary | ICD-10-CM | POA: Diagnosis not present

## 2023-11-04 DIAGNOSIS — N2581 Secondary hyperparathyroidism of renal origin: Secondary | ICD-10-CM | POA: Diagnosis not present

## 2023-11-04 DIAGNOSIS — Z992 Dependence on renal dialysis: Secondary | ICD-10-CM | POA: Diagnosis not present

## 2023-11-06 DIAGNOSIS — D689 Coagulation defect, unspecified: Secondary | ICD-10-CM | POA: Diagnosis not present

## 2023-11-06 DIAGNOSIS — N186 End stage renal disease: Secondary | ICD-10-CM | POA: Diagnosis not present

## 2023-11-06 DIAGNOSIS — Z992 Dependence on renal dialysis: Secondary | ICD-10-CM | POA: Diagnosis not present

## 2023-11-06 DIAGNOSIS — N2581 Secondary hyperparathyroidism of renal origin: Secondary | ICD-10-CM | POA: Diagnosis not present

## 2023-11-08 DIAGNOSIS — N186 End stage renal disease: Secondary | ICD-10-CM | POA: Diagnosis not present

## 2023-11-08 DIAGNOSIS — D689 Coagulation defect, unspecified: Secondary | ICD-10-CM | POA: Diagnosis not present

## 2023-11-08 DIAGNOSIS — Z992 Dependence on renal dialysis: Secondary | ICD-10-CM | POA: Diagnosis not present

## 2023-11-08 DIAGNOSIS — N2581 Secondary hyperparathyroidism of renal origin: Secondary | ICD-10-CM | POA: Diagnosis not present

## 2023-11-10 DIAGNOSIS — N186 End stage renal disease: Secondary | ICD-10-CM | POA: Diagnosis not present

## 2023-11-10 DIAGNOSIS — D689 Coagulation defect, unspecified: Secondary | ICD-10-CM | POA: Diagnosis not present

## 2023-11-10 DIAGNOSIS — N2581 Secondary hyperparathyroidism of renal origin: Secondary | ICD-10-CM | POA: Diagnosis not present

## 2023-11-10 DIAGNOSIS — Z992 Dependence on renal dialysis: Secondary | ICD-10-CM | POA: Diagnosis not present

## 2023-11-13 DIAGNOSIS — Z992 Dependence on renal dialysis: Secondary | ICD-10-CM | POA: Diagnosis not present

## 2023-11-13 DIAGNOSIS — D689 Coagulation defect, unspecified: Secondary | ICD-10-CM | POA: Diagnosis not present

## 2023-11-13 DIAGNOSIS — N2581 Secondary hyperparathyroidism of renal origin: Secondary | ICD-10-CM | POA: Diagnosis not present

## 2023-11-13 DIAGNOSIS — N186 End stage renal disease: Secondary | ICD-10-CM | POA: Diagnosis not present

## 2023-11-15 DIAGNOSIS — Z992 Dependence on renal dialysis: Secondary | ICD-10-CM | POA: Diagnosis not present

## 2023-11-15 DIAGNOSIS — N2581 Secondary hyperparathyroidism of renal origin: Secondary | ICD-10-CM | POA: Diagnosis not present

## 2023-11-15 DIAGNOSIS — D689 Coagulation defect, unspecified: Secondary | ICD-10-CM | POA: Diagnosis not present

## 2023-11-15 DIAGNOSIS — N186 End stage renal disease: Secondary | ICD-10-CM | POA: Diagnosis not present

## 2023-11-17 DIAGNOSIS — N2581 Secondary hyperparathyroidism of renal origin: Secondary | ICD-10-CM | POA: Diagnosis not present

## 2023-11-17 DIAGNOSIS — Z992 Dependence on renal dialysis: Secondary | ICD-10-CM | POA: Diagnosis not present

## 2023-11-17 DIAGNOSIS — N186 End stage renal disease: Secondary | ICD-10-CM | POA: Diagnosis not present

## 2023-11-17 DIAGNOSIS — I129 Hypertensive chronic kidney disease with stage 1 through stage 4 chronic kidney disease, or unspecified chronic kidney disease: Secondary | ICD-10-CM | POA: Diagnosis not present

## 2023-11-17 DIAGNOSIS — D689 Coagulation defect, unspecified: Secondary | ICD-10-CM | POA: Diagnosis not present

## 2023-11-20 DIAGNOSIS — I159 Secondary hypertension, unspecified: Secondary | ICD-10-CM | POA: Diagnosis not present

## 2023-11-20 DIAGNOSIS — D689 Coagulation defect, unspecified: Secondary | ICD-10-CM | POA: Diagnosis not present

## 2023-11-20 DIAGNOSIS — Z992 Dependence on renal dialysis: Secondary | ICD-10-CM | POA: Diagnosis not present

## 2023-11-20 DIAGNOSIS — N2581 Secondary hyperparathyroidism of renal origin: Secondary | ICD-10-CM | POA: Diagnosis not present

## 2023-11-20 DIAGNOSIS — N186 End stage renal disease: Secondary | ICD-10-CM | POA: Diagnosis not present

## 2023-11-20 DIAGNOSIS — D631 Anemia in chronic kidney disease: Secondary | ICD-10-CM | POA: Diagnosis not present

## 2023-11-23 DIAGNOSIS — I159 Secondary hypertension, unspecified: Secondary | ICD-10-CM | POA: Diagnosis not present

## 2023-11-23 DIAGNOSIS — N186 End stage renal disease: Secondary | ICD-10-CM | POA: Diagnosis not present

## 2023-11-23 DIAGNOSIS — N2581 Secondary hyperparathyroidism of renal origin: Secondary | ICD-10-CM | POA: Diagnosis not present

## 2023-11-23 DIAGNOSIS — D689 Coagulation defect, unspecified: Secondary | ICD-10-CM | POA: Diagnosis not present

## 2023-11-23 DIAGNOSIS — Z992 Dependence on renal dialysis: Secondary | ICD-10-CM | POA: Diagnosis not present

## 2023-11-23 DIAGNOSIS — D631 Anemia in chronic kidney disease: Secondary | ICD-10-CM | POA: Diagnosis not present

## 2023-11-25 DIAGNOSIS — D689 Coagulation defect, unspecified: Secondary | ICD-10-CM | POA: Diagnosis not present

## 2023-11-25 DIAGNOSIS — N2581 Secondary hyperparathyroidism of renal origin: Secondary | ICD-10-CM | POA: Diagnosis not present

## 2023-11-25 DIAGNOSIS — Z992 Dependence on renal dialysis: Secondary | ICD-10-CM | POA: Diagnosis not present

## 2023-11-25 DIAGNOSIS — N186 End stage renal disease: Secondary | ICD-10-CM | POA: Diagnosis not present

## 2023-11-25 DIAGNOSIS — D631 Anemia in chronic kidney disease: Secondary | ICD-10-CM | POA: Diagnosis not present

## 2023-11-25 DIAGNOSIS — I159 Secondary hypertension, unspecified: Secondary | ICD-10-CM | POA: Diagnosis not present

## 2023-11-27 DIAGNOSIS — Z992 Dependence on renal dialysis: Secondary | ICD-10-CM | POA: Diagnosis not present

## 2023-11-27 DIAGNOSIS — N186 End stage renal disease: Secondary | ICD-10-CM | POA: Diagnosis not present

## 2023-11-27 DIAGNOSIS — D689 Coagulation defect, unspecified: Secondary | ICD-10-CM | POA: Diagnosis not present

## 2023-11-27 DIAGNOSIS — N2581 Secondary hyperparathyroidism of renal origin: Secondary | ICD-10-CM | POA: Diagnosis not present

## 2023-11-27 DIAGNOSIS — I159 Secondary hypertension, unspecified: Secondary | ICD-10-CM | POA: Diagnosis not present

## 2023-11-27 DIAGNOSIS — D631 Anemia in chronic kidney disease: Secondary | ICD-10-CM | POA: Diagnosis not present

## 2023-11-30 DIAGNOSIS — N2581 Secondary hyperparathyroidism of renal origin: Secondary | ICD-10-CM | POA: Diagnosis not present

## 2023-11-30 DIAGNOSIS — D631 Anemia in chronic kidney disease: Secondary | ICD-10-CM | POA: Diagnosis not present

## 2023-11-30 DIAGNOSIS — Z992 Dependence on renal dialysis: Secondary | ICD-10-CM | POA: Diagnosis not present

## 2023-11-30 DIAGNOSIS — N186 End stage renal disease: Secondary | ICD-10-CM | POA: Diagnosis not present

## 2023-11-30 DIAGNOSIS — D689 Coagulation defect, unspecified: Secondary | ICD-10-CM | POA: Diagnosis not present

## 2023-11-30 DIAGNOSIS — I159 Secondary hypertension, unspecified: Secondary | ICD-10-CM | POA: Diagnosis not present

## 2023-12-02 DIAGNOSIS — D689 Coagulation defect, unspecified: Secondary | ICD-10-CM | POA: Diagnosis not present

## 2023-12-02 DIAGNOSIS — N2581 Secondary hyperparathyroidism of renal origin: Secondary | ICD-10-CM | POA: Diagnosis not present

## 2023-12-02 DIAGNOSIS — D631 Anemia in chronic kidney disease: Secondary | ICD-10-CM | POA: Diagnosis not present

## 2023-12-02 DIAGNOSIS — N186 End stage renal disease: Secondary | ICD-10-CM | POA: Diagnosis not present

## 2023-12-02 DIAGNOSIS — Z992 Dependence on renal dialysis: Secondary | ICD-10-CM | POA: Diagnosis not present

## 2023-12-02 DIAGNOSIS — I159 Secondary hypertension, unspecified: Secondary | ICD-10-CM | POA: Diagnosis not present

## 2023-12-04 DIAGNOSIS — D689 Coagulation defect, unspecified: Secondary | ICD-10-CM | POA: Diagnosis not present

## 2023-12-04 DIAGNOSIS — N2581 Secondary hyperparathyroidism of renal origin: Secondary | ICD-10-CM | POA: Diagnosis not present

## 2023-12-04 DIAGNOSIS — N186 End stage renal disease: Secondary | ICD-10-CM | POA: Diagnosis not present

## 2023-12-04 DIAGNOSIS — Z992 Dependence on renal dialysis: Secondary | ICD-10-CM | POA: Diagnosis not present

## 2023-12-04 DIAGNOSIS — I159 Secondary hypertension, unspecified: Secondary | ICD-10-CM | POA: Diagnosis not present

## 2023-12-04 DIAGNOSIS — D631 Anemia in chronic kidney disease: Secondary | ICD-10-CM | POA: Diagnosis not present

## 2023-12-07 DIAGNOSIS — D689 Coagulation defect, unspecified: Secondary | ICD-10-CM | POA: Diagnosis not present

## 2023-12-07 DIAGNOSIS — D631 Anemia in chronic kidney disease: Secondary | ICD-10-CM | POA: Diagnosis not present

## 2023-12-07 DIAGNOSIS — Z992 Dependence on renal dialysis: Secondary | ICD-10-CM | POA: Diagnosis not present

## 2023-12-07 DIAGNOSIS — I159 Secondary hypertension, unspecified: Secondary | ICD-10-CM | POA: Diagnosis not present

## 2023-12-07 DIAGNOSIS — N186 End stage renal disease: Secondary | ICD-10-CM | POA: Diagnosis not present

## 2023-12-07 DIAGNOSIS — N2581 Secondary hyperparathyroidism of renal origin: Secondary | ICD-10-CM | POA: Diagnosis not present

## 2023-12-08 ENCOUNTER — Emergency Department (HOSPITAL_COMMUNITY): Payer: 59

## 2023-12-08 ENCOUNTER — Other Ambulatory Visit: Payer: Self-pay

## 2023-12-08 ENCOUNTER — Encounter (HOSPITAL_COMMUNITY): Payer: Self-pay

## 2023-12-08 ENCOUNTER — Emergency Department (HOSPITAL_COMMUNITY)
Admission: EM | Admit: 2023-12-08 | Discharge: 2023-12-08 | Disposition: A | Discharge status: Home / Self Care | Payer: 59 | Attending: Emergency Medicine | Admitting: Emergency Medicine

## 2023-12-08 DIAGNOSIS — R059 Cough, unspecified: Secondary | ICD-10-CM | POA: Diagnosis not present

## 2023-12-08 DIAGNOSIS — Z20822 Contact with and (suspected) exposure to covid-19: Secondary | ICD-10-CM | POA: Diagnosis not present

## 2023-12-08 DIAGNOSIS — I120 Hypertensive chronic kidney disease with stage 5 chronic kidney disease or end stage renal disease: Secondary | ICD-10-CM | POA: Diagnosis not present

## 2023-12-08 DIAGNOSIS — Z79899 Other long term (current) drug therapy: Secondary | ICD-10-CM | POA: Insufficient documentation

## 2023-12-08 DIAGNOSIS — Z992 Dependence on renal dialysis: Secondary | ICD-10-CM | POA: Insufficient documentation

## 2023-12-08 DIAGNOSIS — A419 Sepsis, unspecified organism: Secondary | ICD-10-CM | POA: Diagnosis not present

## 2023-12-08 DIAGNOSIS — R509 Fever, unspecified: Secondary | ICD-10-CM | POA: Diagnosis present

## 2023-12-08 DIAGNOSIS — I10 Essential (primary) hypertension: Secondary | ICD-10-CM | POA: Diagnosis not present

## 2023-12-08 DIAGNOSIS — R110 Nausea: Secondary | ICD-10-CM | POA: Diagnosis not present

## 2023-12-08 DIAGNOSIS — N186 End stage renal disease: Secondary | ICD-10-CM | POA: Insufficient documentation

## 2023-12-08 DIAGNOSIS — J111 Influenza due to unidentified influenza virus with other respiratory manifestations: Secondary | ICD-10-CM | POA: Insufficient documentation

## 2023-12-08 DIAGNOSIS — R531 Weakness: Secondary | ICD-10-CM | POA: Diagnosis not present

## 2023-12-08 DIAGNOSIS — Z794 Long term (current) use of insulin: Secondary | ICD-10-CM | POA: Diagnosis not present

## 2023-12-08 DIAGNOSIS — E1122 Type 2 diabetes mellitus with diabetic chronic kidney disease: Secondary | ICD-10-CM | POA: Insufficient documentation

## 2023-12-08 DIAGNOSIS — R079 Chest pain, unspecified: Secondary | ICD-10-CM | POA: Diagnosis not present

## 2023-12-08 LAB — COMPREHENSIVE METABOLIC PANEL
ALT: 10 U/L (ref 0–44)
AST: 16 U/L (ref 15–41)
Albumin: 3.7 g/dL (ref 3.5–5.0)
Alkaline Phosphatase: 136 U/L — ABNORMAL HIGH (ref 38–126)
Anion gap: 16 — ABNORMAL HIGH (ref 5–15)
BUN: 19 mg/dL (ref 8–23)
CO2: 27 mmol/L (ref 22–32)
Calcium: 8.8 mg/dL — ABNORMAL LOW (ref 8.9–10.3)
Chloride: 91 mmol/L — ABNORMAL LOW (ref 98–111)
Creatinine, Ser: 4.34 mg/dL — ABNORMAL HIGH (ref 0.44–1.00)
GFR, Estimated: 11 mL/min — ABNORMAL LOW (ref >60)
Glucose, Bld: 214 mg/dL — ABNORMAL HIGH (ref 70–99)
Potassium: 3.5 mmol/L (ref 3.5–5.1)
Sodium: 134 mmol/L — ABNORMAL LOW (ref 135–145)
Total Bilirubin: 1 mg/dL (ref 0.0–1.2)
Total Protein: 8.6 g/dL — ABNORMAL HIGH (ref 6.5–8.1)

## 2023-12-08 LAB — RESP PANEL BY RT-PCR (RSV, FLU A&B, COVID)  RVPGX2
Influenza A by PCR: POSITIVE — AB
Influenza B by PCR: NEGATIVE
Resp Syncytial Virus by PCR: NEGATIVE
SARS Coronavirus 2 by RT PCR: NEGATIVE

## 2023-12-08 LAB — CBC WITH DIFFERENTIAL/PLATELET
Abs Immature Granulocytes: 0.01 10*3/uL (ref 0.00–0.07)
Basophils Absolute: 0 10*3/uL (ref 0.0–0.1)
Basophils Relative: 1 %
Eosinophils Absolute: 0 10*3/uL (ref 0.0–0.5)
Eosinophils Relative: 0 %
HCT: 34.6 % — ABNORMAL LOW (ref 36.0–46.0)
Hemoglobin: 11 g/dL — ABNORMAL LOW (ref 12.0–15.0)
Immature Granulocytes: 0 %
Lymphocytes Relative: 29 %
Lymphs Abs: 1.2 10*3/uL (ref 0.7–4.0)
MCH: 28.4 pg (ref 26.0–34.0)
MCHC: 31.8 g/dL (ref 30.0–36.0)
MCV: 89.4 fL (ref 80.0–100.0)
Monocytes Absolute: 0.6 10*3/uL (ref 0.1–1.0)
Monocytes Relative: 14 %
Neutro Abs: 2.4 10*3/uL (ref 1.7–7.7)
Neutrophils Relative %: 56 %
Platelets: 212 10*3/uL (ref 150–400)
RBC: 3.87 MIL/uL (ref 3.87–5.11)
RDW: 14.6 % (ref 11.5–15.5)
WBC: 4.2 10*3/uL (ref 4.0–10.5)
nRBC: 0 % (ref 0.0–0.2)

## 2023-12-08 LAB — I-STAT CG4 LACTIC ACID, ED: Lactic Acid, Venous: 1.1 mmol/L (ref 0.5–1.9)

## 2023-12-08 LAB — PROTIME-INR
INR: 1.1 (ref 0.8–1.2)
Prothrombin Time: 14.1 s (ref 11.4–15.2)

## 2023-12-08 LAB — LIPASE, BLOOD: Lipase: 35 U/L (ref 11–51)

## 2023-12-08 LAB — APTT: aPTT: 31 s (ref 24–36)

## 2023-12-08 MED ORDER — OSELTAMIVIR PHOSPHATE 30 MG PO CAPS
30.0000 mg | ORAL_CAPSULE | Freq: Once | ORAL | Status: AC
  Administered 2023-12-08: 30 mg via ORAL
  Filled 2023-12-08: qty 1

## 2023-12-08 MED ORDER — OSELTAMIVIR PHOSPHATE 30 MG PO CAPS: 30.0000 mg | ORAL_CAPSULE | ORAL | 0 refills | Status: AC | PRN

## 2023-12-08 NOTE — ED Provider Notes (Signed)
[place] EMERGENCY DEPARTMENT AT [facility] Provider Note   CSN: [id number] Arrival date & time: 12/08/23  1054    History  Chief Complaint  Patient presents with   Fever    Tricia Huynh is a 68 y.o. female DM, HTN, Dialysis,  Cough, CP, chills, sub fever, lack of app since Sat. No meds at home. Family at home has flu. Cough non productive. Abd pain cannot localize. Today felt worse, gen myalgias. No diarrhea, constipation, cough, dry, LE swelling. Did not take her home meds today PTA.  No bloody stool, hemoptysis. Still makes urine occasionally, not daily. No dysuria, hematuria. No PND, orthopnea. No changes to dialysis site on LUE.   HPI     Home Medications Prior to Admission medications   Medication Sig Start Date End Date Taking? Authorizing Provider  oseltamivir (TAMIFLU) 30 MG capsule Take 1 capsule (30 mg total) by mouth every dialysis. 12/08/23  Yes Virjean Boman A, PA-C  albuterol (VENTOLIN HFA) 108 (90 Base) MCG/ACT inhaler Inhale 1-2 puffs into the lungs every 6 (six) hours as needed for wheezing or shortness of breath. 10/26/22   Derwood Kaplan, MD  amLODipine (NORVASC) 10 MG tablet Take 1 tablet (10 mg total) by mouth daily. 09/21/22 10/07/23  Lanae Boast, MD  atorvastatin (LIPITOR) 40 MG tablet Take 40 mg by mouth daily. 12/23/19   [provider]  carvedilol (COREG) 25 MG tablet Take 1 tablet (25 mg total) by mouth 2 (two) times daily with a meal. 09/21/22 10/07/23  Lanae Boast, MD  Continuous Blood Gluc Sensor (FREESTYLE LIBRE 3 SENSOR) MISC 1 each by Does not apply route 2 (two) times a week. Place 1 sensor on the skin every 14 days. Use to check glucose continuously 09/22/22   Lanae Boast, MD  estradiol (ESTRACE) 0.1 MG/GM vaginal cream Apply 1 gram per vagina every night for 2 weeks, then apply two times a week 10/07/23   Conan Bowens, MD  ferrous sulfate 325 (65 FE) MG tablet Take 1 tablet (325 mg total) by mouth daily with  breakfast. Patient not taking: Reported on 10/07/2023 05/02/22   Marguerita Merles Latif, DO  gabapentin (NEURONTIN) 100 MG capsule Take 1 capsule (100 mg total) by mouth at bedtime. 07/28/22   Danford, Earl Lites, MD  hydrALAZINE (APRESOLINE) 50 MG tablet Take 50 mg by mouth 3 (three) times daily.    [provider]  insulin glargine (LANTUS SOLOSTAR) 100 UNIT/ML Solostar Pen Inject 5 Units into the skin daily. Discard pen after 28 days from first use 02/12/23   Ghimire, Werner Lean, MD  Insulin Pen Needle (TECHLITE PEN NEEDLES) 32G X 4 MM MISC Use to inject lantus once a day 07/28/22   Danford, Earl Lites, MD  metroNIDAZOLE (METROGEL) 0.75 % vaginal gel Place 1 Applicatorful vaginally at bedtime. Apply one applicatorful to vagina at bedtime for 5 days 10/07/23   Conan Bowens, MD  Vitamin D, Ergocalciferol, (DRISDOL) 1.25 MG (50000 UNIT) CAPS capsule Take 50,000 Units by mouth every 7 (seven) days. Patient not taking: Reported on 10/07/2023    [provider]      Allergies    Percocet [oxycodone-acetaminophen] and Vicodin [hydrocodone-acetaminophen]    Review of Systems   Review of Systems  Constitutional:  Positive for activity change, appetite change, fatigue and fever (subjective).  HENT:  Positive for postnasal drip and rhinorrhea. Negative for sore throat, trouble swallowing and voice change.   Respiratory:  Positive for cough. Negative for apnea,  choking, chest tightness, shortness of breath, wheezing and stridor.   Cardiovascular: Negative.   Gastrointestinal:  Positive for nausea. Negative for abdominal distention, abdominal pain, anal bleeding, blood in stool, constipation, diarrhea, rectal pain and vomiting.  Genitourinary: Negative.   Musculoskeletal:  Positive for myalgias.  Neurological: Negative.   All other systems reviewed and are negative.   Physical Exam Updated Vital Signs BP 124/87 (BP Location: Right Arm)   Pulse 80   Temp 99.8 F (37.7 C) (Oral)    Resp 18   Ht 5\' 8"  (1.727 m)   Wt 53 kg   SpO2 100%   BMI 17.77 kg/m  Physical Exam Vitals and nursing note reviewed.  Constitutional:      General: She is not in acute distress.    Appearance: She is well-developed. She is not ill-appearing, toxic-appearing or diaphoretic.  HENT:     Head: Normocephalic and atraumatic.     Nose: Nose normal.     Mouth/Throat:     Mouth: Mucous membranes are moist.  Eyes:     Pupils: Pupils are equal, round, and reactive to light.  Cardiovascular:     Rate and Rhythm: Normal rate.     Pulses: Normal pulses.          Radial pulses are 2+ on the right side and 2+ on the left side.       Dorsalis pedis pulses are 2+ on the right side and 2+ on the left side.     Heart sounds: Normal heart sounds.  Pulmonary:     Effort: Pulmonary effort is normal. No respiratory distress.     Breath sounds: Normal breath sounds.  Abdominal:     General: Bowel sounds are normal. There is no distension.     Palpations: Abdomen is soft. There is no mass.     Tenderness: There is no abdominal tenderness. There is no right CVA tenderness, left CVA tenderness or guarding.  Musculoskeletal:        General: No swelling, tenderness, deformity or signs of injury. Normal range of motion.     Cervical back: Normal range of motion.     Right lower leg: No edema.     Left lower leg: No edema.     Comments: No bony tenderness, compartment soft  Dialysis access left upper extremity palpable thrill  Skin:    General: Skin is warm and dry.     Capillary Refill: Capillary refill takes less than 2 seconds.  Neurological:     General: No focal deficit present.     Mental Status: She is alert.     Cranial Nerves: No cranial nerve deficit.     Motor: No weakness.     Gait: Gait normal.  Psychiatric:        Mood and Affect: Mood normal.     ED Results / Procedures / Treatments   Labs (all labs ordered are listed, but only abnormal results are displayed) Labs Reviewed   RESP PANEL BY RT-PCR (RSV, FLU A&B, COVID)  RVPGX2 - Abnormal; Notable for the following components:      Result Value   Influenza A by PCR POSITIVE (*)    All other components within normal limits  COMPREHENSIVE METABOLIC PANEL - Abnormal; Notable for the following components:   Sodium 134 (*)    Chloride 91 (*)    Glucose, Bld 214 (*)    Creatinine, Ser 4.34 (*)    Calcium 8.8 (*)    Total Protein  8.6 (*)    Alkaline Phosphatase 136 (*)    GFR, Estimated 11 (*)    Anion gap 16 (*)    All other components within normal limits  CBC WITH DIFFERENTIAL/PLATELET - Abnormal; Notable for the following components:   Hemoglobin 11.0 (*)    HCT 34.6 (*)    All other components within normal limits  CULTURE, BLOOD (ROUTINE X 2)  PROTIME-INR  APTT  LIPASE, BLOOD  URINALYSIS, W/ REFLEX TO CULTURE (INFECTION SUSPECTED)  I-STAT CG4 LACTIC ACID, ED  I-STAT CG4 LACTIC ACID, ED    EKG EKG Interpretation Date/Time:  Tuesday December 08 2023 11:53:28 EST Ventricular Rate:  84 PR Interval:  148 QRS Duration:  74 QT Interval:  398 QTC Calculation: 470 R Axis:   -2  Text Interpretation: Normal sinus rhythm Possible Anterior infarct , age undetermined No significant change since last tracing When compared with ECG of 20-Jul-2023 16:51, PREVIOUS ECG IS PRESENT Confirmed by Gwyneth Sprout ([id number]) on 12/08/2023 12:02:00 PM  Radiology DG Chest 1 View Result Date: 12/08/2023 CLINICAL DATA:  Sepsis. Chest pain with nausea and weakness for 3 days. EXAM: CHEST  1 VIEW COMPARISON:  Radiographs 07/20/2023 and 02/12/2023. Chest CT 03/27/2022. Abdominal CT 07/30/2023. FINDINGS: The heart size and mediastinal contours are normal. The lungs are clear. There is no pleural effusion or pneumothorax. No acute osseous findings are identified. IMPRESSION: No evidence of acute cardiopulmonary process. Electronically Signed   By: Carey Bullocks M.D.   On: 12/08/2023 11:51    Procedures Procedures     Medications Ordered in ED Medications  oseltamivir (TAMIFLU) capsule 30 mg (30 mg Oral Given 12/08/23 1640)    ED Course/ Medical Decision Making/ A&P   68 year old history of diabetes, dialysis here for evaluation of feeling unwell over the last 4 days.  Cough, subjective fever, decreased appetite, nausea. No CP, SOB, abd pain.  Occasionally makes urine however not consistently.  Went to dialysis yesterday.  Got full treatment.  She is afebrile, nonseptic, not ill-appearing.  Her heart and lungs are clear.  Abdomen soft nontender.  She does not appear grossly fluid overloaded.  Her dialysis access is C/D/I palpable thrill.  She is tolerating p.o. intake here, ambulatory.  Plan on labs, imaging and reassess  Labs and imaging personally viewed and interpreted:  Influenza positive Lactic 1.1 Lipase 35 CBC without leukocytosis, hemoglobin 11.0 Metabolic panel sodium 134, creatinine 4.34, glucose 214, alk phos 136, similar to prior X-ray that cardiomegaly, pulm edema, pneumothorax, infiltrates  Patient reassessed.  We discussed risk versus benefit of Tamiflu.  Would like trial.  I discussed with pharmacy.  Recommend 30 mg dose here, additional dose with dialysis for a total of 1 week.  Discussed with patient.  Will have her follow-up outpatient, return for new or worsening symptoms.  At this time she does not appear septic.  Blood cultures pending on discharge.  No obvious bacterial infectious source.  Does not meet criteria for sepsis.  The patient has been appropriately medically screened and/or stabilized in the ED. I have low suspicion for any other emergent medical condition which would require further screening, evaluation or treatment in the ED or require inpatient management.  Patient is hemodynamically stable and in no acute distress.  Patient able to ambulate in department prior to ED.  Evaluation does not show acute pathology that would require ongoing or additional emergent  interventions while in the emergency department or further inpatient treatment.  I have discussed the diagnosis with the  patient and answered all questions.  Pain is been managed while in the emergency department and patient has no further complaints prior to discharge.  Patient is comfortable with plan discussed in room and is stable for discharge at this time.  I have discussed strict return precautions for returning to the emergency department.  Patient was encouraged to follow-up with PCP/specialist refer to at discharge.                                 Medical Decision Making Amount and/or Complexity of Data Reviewed External Data Reviewed: labs, radiology, ECG and notes. Labs: ordered. Decision-making details documented in ED Course. Radiology: ordered and independent interpretation performed. Decision-making details documented in ED Course. ECG/medicine tests: ordered and independent interpretation performed. Decision-making details documented in ED Course.  Risk OTC drugs. Prescription drug management. Decision regarding hospitalization. Diagnosis or treatment significantly limited by social determinants of health.           Final Clinical Impression(s) / ED Diagnoses Final diagnoses:  Influenza  ESRD (end stage renal disease) (HCC)    Rx / DC Orders ED Discharge Orders          Ordered    oseltamivir (TAMIFLU) 30 MG capsule  Every Dialysis        12/08/23 1611              Shavaughn Seidl A, PA-C 12/08/23 1651    Rondel Baton, MD 12/09/23 1336

## 2023-12-08 NOTE — Discharge Instructions (Addendum)
Your influenza test was positive this is likely the cause of your symptoms.  We have started on Tamiflu.  Your first dose was given here.  You will take a dose when you dialyze over the next week  Return for new or worsening symptoms

## 2023-12-08 NOTE — ED Provider Triage Note (Signed)
Emergency Medicine Provider Triage Evaluation Note  Tricia Huynh , a 68 y.o. female  was evaluated in triage.  Pt complains of several days of cough, congestion, chest pain, shortness of breath, myalgias, upper abdominal pain associated with some vomiting.  She denies productive cough at this time.  She does not use inhalers at home but is reporting malaise and myalgias.  History of end-stage renal disease on dialysis Monday Wednesday Friday  Review of Systems  Positive: URI symptoms, cough, congestion, chest pain, shortness of breath, fever and chills Negative: No diarrhea  Physical Exam  BP (!) 192/87 (BP Location: Left Arm)   Pulse 86   Temp 99.4 F (37.4 C) (Oral)   Resp 18   Ht 5\' 8"  (1.727 m)   Wt 53 kg   SpO2 99%   BMI 17.77 kg/m  Gen:   Awake, no distress   Resp:  Normal effort, breath sounds are clear MSK:   Moves extremities without difficulty no acute edema Other:  Upper abdominal pain with palpation, no lower abdominal pain  Medical Decision Making  Medically screening exam initiated at 11:16 AM.  Appropriate orders placed.  CHASELYN MARONE was informed that the remainder of the evaluation will be completed by another provider, this initial triage assessment does not replace that evaluation, and the importance of remaining in the ED until their evaluation is complete.  Patient with end-stage renal disease presenting today with complaint of fever at home 99.4 here with URI and flu type symptoms.  Undifferentiated sepsis order set initiated.  Currently patient is in no distress and sats are normal.   Gwyneth Sprout, MD 12/08/23 1119

## 2023-12-08 NOTE — ED Triage Notes (Addendum)
Pt to ED via [facility] from home. Pt has had cough and fever x 4 days. Pt is a dialysis pt.  Pt has had nausea, cough, chest tightness, abdominal pain and fever that started 3 days ago. Pt gets dialysis M,W,F. Pt did get full tx yesterday.  EMS VS 200/108 HR 90 98% RA Cbg 220 99.5

## 2023-12-09 DIAGNOSIS — D689 Coagulation defect, unspecified: Secondary | ICD-10-CM | POA: Diagnosis not present

## 2023-12-09 DIAGNOSIS — N2581 Secondary hyperparathyroidism of renal origin: Secondary | ICD-10-CM | POA: Diagnosis not present

## 2023-12-09 DIAGNOSIS — Z992 Dependence on renal dialysis: Secondary | ICD-10-CM | POA: Diagnosis not present

## 2023-12-09 DIAGNOSIS — D631 Anemia in chronic kidney disease: Secondary | ICD-10-CM | POA: Diagnosis not present

## 2023-12-09 DIAGNOSIS — I159 Secondary hypertension, unspecified: Secondary | ICD-10-CM | POA: Diagnosis not present

## 2023-12-09 DIAGNOSIS — N186 End stage renal disease: Secondary | ICD-10-CM | POA: Diagnosis not present

## 2023-12-11 DIAGNOSIS — N186 End stage renal disease: Secondary | ICD-10-CM | POA: Diagnosis not present

## 2023-12-11 DIAGNOSIS — D689 Coagulation defect, unspecified: Secondary | ICD-10-CM | POA: Diagnosis not present

## 2023-12-11 DIAGNOSIS — N2581 Secondary hyperparathyroidism of renal origin: Secondary | ICD-10-CM | POA: Diagnosis not present

## 2023-12-11 DIAGNOSIS — Z992 Dependence on renal dialysis: Secondary | ICD-10-CM | POA: Diagnosis not present

## 2023-12-11 DIAGNOSIS — I159 Secondary hypertension, unspecified: Secondary | ICD-10-CM | POA: Diagnosis not present

## 2023-12-11 DIAGNOSIS — D631 Anemia in chronic kidney disease: Secondary | ICD-10-CM | POA: Diagnosis not present

## 2023-12-13 LAB — CULTURE, BLOOD (ROUTINE X 2): Culture: NO GROWTH

## 2023-12-14 DIAGNOSIS — Z992 Dependence on renal dialysis: Secondary | ICD-10-CM | POA: Diagnosis not present

## 2023-12-14 DIAGNOSIS — N2581 Secondary hyperparathyroidism of renal origin: Secondary | ICD-10-CM | POA: Diagnosis not present

## 2023-12-14 DIAGNOSIS — D689 Coagulation defect, unspecified: Secondary | ICD-10-CM | POA: Diagnosis not present

## 2023-12-14 DIAGNOSIS — I159 Secondary hypertension, unspecified: Secondary | ICD-10-CM | POA: Diagnosis not present

## 2023-12-14 DIAGNOSIS — N186 End stage renal disease: Secondary | ICD-10-CM | POA: Diagnosis not present

## 2023-12-14 DIAGNOSIS — D631 Anemia in chronic kidney disease: Secondary | ICD-10-CM | POA: Diagnosis not present

## 2023-12-16 DIAGNOSIS — N186 End stage renal disease: Secondary | ICD-10-CM | POA: Diagnosis not present

## 2023-12-16 DIAGNOSIS — D689 Coagulation defect, unspecified: Secondary | ICD-10-CM | POA: Diagnosis not present

## 2023-12-16 DIAGNOSIS — D631 Anemia in chronic kidney disease: Secondary | ICD-10-CM | POA: Diagnosis not present

## 2023-12-16 DIAGNOSIS — Z992 Dependence on renal dialysis: Secondary | ICD-10-CM | POA: Diagnosis not present

## 2023-12-16 DIAGNOSIS — I159 Secondary hypertension, unspecified: Secondary | ICD-10-CM | POA: Diagnosis not present

## 2023-12-16 DIAGNOSIS — N2581 Secondary hyperparathyroidism of renal origin: Secondary | ICD-10-CM | POA: Diagnosis not present

## 2023-12-18 DIAGNOSIS — I159 Secondary hypertension, unspecified: Secondary | ICD-10-CM | POA: Diagnosis not present

## 2023-12-18 DIAGNOSIS — N186 End stage renal disease: Secondary | ICD-10-CM | POA: Diagnosis not present

## 2023-12-18 DIAGNOSIS — D631 Anemia in chronic kidney disease: Secondary | ICD-10-CM | POA: Diagnosis not present

## 2023-12-18 DIAGNOSIS — D689 Coagulation defect, unspecified: Secondary | ICD-10-CM | POA: Diagnosis not present

## 2023-12-18 DIAGNOSIS — N2581 Secondary hyperparathyroidism of renal origin: Secondary | ICD-10-CM | POA: Diagnosis not present

## 2023-12-18 DIAGNOSIS — I129 Hypertensive chronic kidney disease with stage 1 through stage 4 chronic kidney disease, or unspecified chronic kidney disease: Secondary | ICD-10-CM | POA: Diagnosis not present

## 2023-12-18 DIAGNOSIS — Z992 Dependence on renal dialysis: Secondary | ICD-10-CM | POA: Diagnosis not present

## 2024-01-03 ENCOUNTER — Encounter (HOSPITAL_COMMUNITY): Payer: Self-pay | Admitting: Pharmacy Technician

## 2024-01-03 ENCOUNTER — Other Ambulatory Visit: Payer: Self-pay

## 2024-01-03 ENCOUNTER — Encounter (HOSPITAL_COMMUNITY): Payer: Self-pay

## 2024-01-03 ENCOUNTER — Emergency Department (HOSPITAL_COMMUNITY)
Admission: EM | Admit: 2024-01-03 | Discharge: 2024-01-03 | Disposition: A | Discharge status: Home / Self Care | Payer: 59 | Attending: Emergency Medicine | Admitting: Emergency Medicine

## 2024-01-03 ENCOUNTER — Emergency Department (HOSPITAL_COMMUNITY): Payer: 59

## 2024-01-03 DIAGNOSIS — Z992 Dependence on renal dialysis: Secondary | ICD-10-CM | POA: Insufficient documentation

## 2024-01-03 DIAGNOSIS — R110 Nausea: Secondary | ICD-10-CM | POA: Insufficient documentation

## 2024-01-03 DIAGNOSIS — E1165 Type 2 diabetes mellitus with hyperglycemia: Secondary | ICD-10-CM | POA: Diagnosis not present

## 2024-01-03 DIAGNOSIS — R000 Tachycardia, unspecified: Secondary | ICD-10-CM | POA: Insufficient documentation

## 2024-01-03 DIAGNOSIS — E1122 Type 2 diabetes mellitus with diabetic chronic kidney disease: Secondary | ICD-10-CM | POA: Diagnosis not present

## 2024-01-03 DIAGNOSIS — R739 Hyperglycemia, unspecified: Secondary | ICD-10-CM

## 2024-01-03 DIAGNOSIS — N186 End stage renal disease: Secondary | ICD-10-CM | POA: Insufficient documentation

## 2024-01-03 DIAGNOSIS — R1084 Generalized abdominal pain: Secondary | ICD-10-CM | POA: Diagnosis not present

## 2024-01-03 DIAGNOSIS — Z794 Long term (current) use of insulin: Secondary | ICD-10-CM | POA: Diagnosis not present

## 2024-01-03 LAB — URINALYSIS, ROUTINE W REFLEX MICROSCOPIC
Bacteria, UA: NONE SEEN
Bilirubin Urine: NEGATIVE
Glucose, UA: >=500 mg/dL — AB
Hgb urine dipstick: NEGATIVE
Ketones, ur: NEGATIVE mg/dL
Leukocytes,Ua: NEGATIVE
Nitrite: NEGATIVE
Protein, ur: 100 mg/dL — AB
Specific Gravity, Urine: 1.013 (ref 1.005–1.030)
pH: 8 (ref 5.0–8.0)

## 2024-01-03 LAB — LIPASE, BLOOD: Lipase: 58 U/L — ABNORMAL HIGH (ref 11–51)

## 2024-01-03 LAB — CBC WITH DIFFERENTIAL/PLATELET
Abs Immature Granulocytes: 0.05 10*3/uL (ref 0.00–0.07)
Basophils Absolute: 0 10*3/uL (ref 0.0–0.1)
Basophils Relative: 0 %
Eosinophils Absolute: 0 10*3/uL (ref 0.0–0.5)
Eosinophils Relative: 0 %
HCT: 31.4 % — ABNORMAL LOW (ref 36.0–46.0)
Hemoglobin: 10.1 g/dL — ABNORMAL LOW (ref 12.0–15.0)
Immature Granulocytes: 1 %
Lymphocytes Relative: 36 %
Lymphs Abs: 1.4 10*3/uL (ref 0.7–4.0)
MCH: 29.7 pg (ref 26.0–34.0)
MCHC: 32.2 g/dL (ref 30.0–36.0)
MCV: 92.4 fL (ref 80.0–100.0)
Monocytes Absolute: 0.3 10*3/uL (ref 0.1–1.0)
Monocytes Relative: 9 %
Neutro Abs: 2 10*3/uL (ref 1.7–7.7)
Neutrophils Relative %: 54 %
Platelets: 153 10*3/uL (ref 150–400)
RBC: 3.4 MIL/uL — ABNORMAL LOW (ref 3.87–5.11)
RDW: 14.6 % (ref 11.5–15.5)
WBC: 3.8 10*3/uL — ABNORMAL LOW (ref 4.0–10.5)
nRBC: 0 % (ref 0.0–0.2)

## 2024-01-03 LAB — CBG MONITORING, ED
Glucose-Capillary: 477 mg/dL — ABNORMAL HIGH (ref 70–99)
Glucose-Capillary: 425 mg/dL — ABNORMAL HIGH (ref 70–99)
Glucose-Capillary: 385 mg/dL — ABNORMAL HIGH (ref 70–99)
Glucose-Capillary: 252 mg/dL — ABNORMAL HIGH (ref 70–99)
Glucose-Capillary: 125 mg/dL — ABNORMAL HIGH (ref 70–99)

## 2024-01-03 LAB — I-STAT VENOUS BLOOD GAS, ED
Acid-Base Excess: 2 mmol/L (ref 0.0–2.0)
Bicarbonate: 26.3 mmol/L (ref 20.0–28.0)
Calcium, Ion: 0.98 mmol/L — ABNORMAL LOW (ref 1.15–1.40)
HCT: 32 % — ABNORMAL LOW (ref 36.0–46.0)
Hemoglobin: 10.9 g/dL — ABNORMAL LOW (ref 12.0–15.0)
O2 Saturation: 99 %
Potassium: 3.6 mmol/L (ref 3.5–5.1)
Sodium: 131 mmol/L — ABNORMAL LOW (ref 135–145)
TCO2: 27 mmol/L (ref 22–32)
pCO2, Ven: 37.7 mm[Hg] — ABNORMAL LOW (ref 44–60)
pH, Ven: 7.452 — ABNORMAL HIGH (ref 7.25–7.43)
pO2, Ven: 144 mm[Hg] — ABNORMAL HIGH (ref 32–45)

## 2024-01-03 LAB — COMPREHENSIVE METABOLIC PANEL
ALT: 16 U/L (ref 0–44)
AST: 22 U/L (ref 15–41)
Albumin: 3.1 g/dL — ABNORMAL LOW (ref 3.5–5.0)
Alkaline Phosphatase: 162 U/L — ABNORMAL HIGH (ref 38–126)
Anion gap: 13 (ref 5–15)
BUN: 36 mg/dL — ABNORMAL HIGH (ref 8–23)
CO2: 23 mmol/L (ref 22–32)
Calcium: 8.1 mg/dL — ABNORMAL LOW (ref 8.9–10.3)
Chloride: 94 mmol/L — ABNORMAL LOW (ref 98–111)
Creatinine, Ser: 4.21 mg/dL — ABNORMAL HIGH (ref 0.44–1.00)
GFR, Estimated: 11 mL/min — ABNORMAL LOW (ref >60)
Glucose, Bld: 575 mg/dL (ref 70–99)
Potassium: 3.6 mmol/L (ref 3.5–5.1)
Sodium: 130 mmol/L — ABNORMAL LOW (ref 135–145)
Total Bilirubin: 0.4 mg/dL (ref 0.0–1.2)
Total Protein: 6.5 g/dL (ref 6.5–8.1)

## 2024-01-03 LAB — BETA-HYDROXYBUTYRIC ACID: Beta-Hydroxybutyric Acid: 0.11 mmol/L (ref 0.05–0.27)

## 2024-01-03 MED ORDER — DEXTROSE 50 % IV SOLN: 0.0000 mL | INTRAVENOUS | Status: DC | PRN

## 2024-01-03 MED ORDER — DEXTROSE IN LACTATED RINGERS 5 % IV SOLN: INTRAVENOUS | Status: DC

## 2024-01-03 MED ORDER — FENTANYL CITRATE PF 50 MCG/ML IJ SOSY
50.0000 ug | PREFILLED_SYRINGE | Freq: Once | INTRAMUSCULAR | Status: AC
  Administered 2024-01-03: 50 ug via INTRAVENOUS
  Filled 2024-01-03: qty 1

## 2024-01-03 MED ORDER — IOHEXOL 350 MG/ML SOLN
75.0000 mL | Freq: Once | INTRAVENOUS | Status: AC | PRN
Start: 2024-01-03 — End: 2024-01-03
  Administered 2024-01-03: 75 mL via INTRAVENOUS

## 2024-01-03 MED ORDER — POTASSIUM CHLORIDE 10 MEQ/100ML IV SOLN
10.0000 meq | Freq: Once | INTRAVENOUS | Status: AC
Start: 2024-01-03 — End: 2024-01-03
  Administered 2024-01-03: 10 meq via INTRAVENOUS
  Filled 2024-01-03: qty 100

## 2024-01-03 MED ORDER — INSULIN REGULAR(HUMAN) IN NACL 100-0.9 UT/100ML-% IV SOLN
INTRAVENOUS | Status: DC
  Administered 2024-01-03: 4.8 [IU]/h via INTRAVENOUS
  Filled 2024-01-03: qty 100

## 2024-01-03 MED ORDER — HYDRALAZINE HCL 20 MG/ML IJ SOLN
20.0000 mg | Freq: Once | INTRAMUSCULAR | Status: AC
  Administered 2024-01-03: 20 mg via INTRAVENOUS
  Filled 2024-01-03: qty 1

## 2024-01-03 MED ORDER — LACTATED RINGERS IV SOLN: INTRAVENOUS | Status: DC

## 2024-01-03 NOTE — ED Provider Notes (Signed)
[facility] EMERGENCY DEPARTMENT AT [facility] Provider Note   CSN: [id number] Arrival date & time: 01/03/24  1215     History {Add pertinent medical, surgical, social history, OB history to HPI:1} Chief Complaint  Patient presents with  . Abdominal Pain  . Hyperglycemia    Tricia Huynh is a 68 y.o. female.   Abdominal Pain Hyperglycemia Associated symptoms: abdominal pain      This patient is a 68 year old female, she has a history of end-stage renal disease on dialysis access left upper extremity.  She also has a history of diabetes and is insulin-dependent.  She reports that she woke up this morning with diffuse abdominal pain and nausea but no vomiting or diarrhea.  The patient reports that she has had a prior hysterectomy, she cannot give me any more details but has no other abdominal surgery that she is aware of.  She has not missed any dialysis, she goes Monday Wednesday Friday and last went 2 days ago at her appointed time.  She states the abdominal pain is diffuse and radiates to her back, she has never had anything like this in the past.  Paramedics noted that the patient was hypertensive not febrile, not tachycardic.  Blood sugar was too high to read on the monitor  Home Medications Prior to Admission medications   Medication Sig Start Date End Date Taking? Authorizing Provider  albuterol (VENTOLIN HFA) 108 (90 Base) MCG/ACT inhaler Inhale 1-2 puffs into the lungs every 6 (six) hours as needed for wheezing or shortness of breath. 10/26/22   Derwood Kaplan, MD  amLODipine (NORVASC) 10 MG tablet Take 1 tablet (10 mg total) by mouth daily. 09/21/22 10/07/23  Lanae Boast, MD  atorvastatin (LIPITOR) 40 MG tablet Take 40 mg by mouth daily. 12/23/19   [provider]  carvedilol (COREG) 25 MG tablet Take 1 tablet (25 mg total) by mouth 2 (two) times daily with a meal. 09/21/22 10/07/23  Lanae Boast, MD  Continuous Blood Gluc Sensor (FREESTYLE LIBRE 3 SENSOR)  MISC 1 each by Does not apply route 2 (two) times a week. Place 1 sensor on the skin every 14 days. Use to check glucose continuously 09/22/22   Lanae Boast, MD  estradiol (ESTRACE) 0.1 MG/GM vaginal cream Apply 1 gram per vagina every night for 2 weeks, then apply two times a week 10/07/23   Conan Bowens, MD  ferrous sulfate 325 (65 FE) MG tablet Take 1 tablet (325 mg total) by mouth daily with breakfast. Patient not taking: Reported on 10/07/2023 05/02/22   Marguerita Merles Latif, DO  gabapentin (NEURONTIN) 100 MG capsule Take 1 capsule (100 mg total) by mouth at bedtime. 07/28/22   Danford, Earl Lites, MD  hydrALAZINE (APRESOLINE) 50 MG tablet Take 50 mg by mouth 3 (three) times daily.    [provider]  insulin glargine (LANTUS SOLOSTAR) 100 UNIT/ML Solostar Pen Inject 5 Units into the skin daily. Discard pen after 28 days from first use 02/12/23   Ghimire, Werner Lean, MD  Insulin Pen Needle (TECHLITE PEN NEEDLES) 32G X 4 MM MISC Use to inject lantus once a day 07/28/22   Danford, Earl Lites, MD  metroNIDAZOLE (METROGEL) 0.75 % vaginal gel Place 1 Applicatorful vaginally at bedtime. Apply one applicatorful to vagina at bedtime for 5 days 10/07/23   Conan Bowens, MD  oseltamivir (TAMIFLU) 30 MG capsule Take 1 capsule (30 mg total) by mouth every dialysis. 12/08/23   Henderly, Britni A, PA-C  Vitamin  D, Ergocalciferol, (DRISDOL) 1.25 MG (50000 UNIT) CAPS capsule Take 50,000 Units by mouth every 7 (seven) days. Patient not taking: Reported on 10/07/2023    [provider]      Allergies    Percocet [oxycodone-acetaminophen] and Vicodin [hydrocodone-acetaminophen]    Review of Systems   Review of Systems  Gastrointestinal:  Positive for abdominal pain.  All other systems reviewed and are negative.   Physical Exam Updated Vital Signs There were no vitals taken for this visit. Physical Exam Vitals and nursing note reviewed.  Constitutional:      General: She is not in  acute distress.    Appearance: She is well-developed.  HENT:     Head: Normocephalic and atraumatic.     Mouth/Throat:     Pharynx: No oropharyngeal exudate.  Eyes:     General: No scleral icterus.       Right eye: No discharge.        Left eye: No discharge.     Conjunctiva/sclera: Conjunctivae normal.     Pupils: Pupils are equal, round, and reactive to light.  Neck:     Thyroid: No thyromegaly.     Vascular: No JVD.  Cardiovascular:     Rate and Rhythm: Regular rhythm. Tachycardia present.     Heart sounds: Normal heart sounds. No murmur heard.    No friction rub. No gallop.     Comments: Borderline tachycardia, good thrill of the left upper extremity fistula with no overlying redness or warmth Pulmonary:     Effort: Pulmonary effort is normal. No respiratory distress.     Breath sounds: Normal breath sounds. No wheezing or rales.  Abdominal:     General: Bowel sounds are normal. There is no distension.     Palpations: Abdomen is soft. There is no mass.     Tenderness: There is abdominal tenderness.     Comments: Diffuse abdominal tenderness with high tinkling increased bowel sounds and diffuse tympanitic sounds to percussion.  There is diffuse guarding  Musculoskeletal:        General: No tenderness. Normal range of motion.     Cervical back: Normal range of motion and neck supple.     Right lower leg: No edema.     Left lower leg: No edema.  Lymphadenopathy:     Cervical: No cervical adenopathy.  Skin:    General: Skin is warm and dry.     Findings: No erythema or rash.  Neurological:     Mental Status: She is alert.     Coordination: Coordination normal.  Psychiatric:        Behavior: Behavior normal.    ED Results / Procedures / Treatments   Labs (all labs ordered are listed, but only abnormal results are displayed) Labs Reviewed - No data to display  EKG None  Radiology No results found.  Procedures Procedures  {Document cardiac monitor, telemetry  assessment procedure when appropriate:1}  Medications Ordered in ED Medications - No data to display  ED Course/ Medical Decision Making/ A&P   {   Click here for ABCD2, HEART and other calculatorsREFRESH Note before signing :1}                              Medical Decision Making Amount and/or Complexity of Data Reviewed Labs: ordered. Radiology: ordered.  Risk Prescription drug management.    This patient presents to the ED for concern of abdominal pain and  hyperglycemia, this involves an extensive number of treatment options, and is a complaint that carries with it a high risk of complications and morbidity.  The differential diagnosis includes small bowel obstruction, primarily DKA, pancreatitis, other bowel abnormalities, perforation, enteritis, cholecystitis or appendicitis.  Could be a AAA with radiation to the back   Co morbidities that complicate the patient evaluation  Diabetes, end-stage renal disease   Additional history obtained:  Additional history obtained from electronic medical record External records from outside source obtained and reviewed including prior emergency department evaluation for influenza approximately 3 weeks ago, history of hypertension chronic kidney disease, goes to [facility], admitted for diabetic kidney failure in March 2024, had been admitted for hypertensive encephalopathy in 2023 and multiple admissions for hyperglycemia in the past   Lab Tests:  I Ordered, and personally interpreted labs.  The pertinent results include: Hyperglycemia, pending labs to prove or disprove DKA at this time   Imaging Studies ordered:  I ordered imaging studies including ***  I independently visualized and interpreted imaging which showed *** I agree with the radiologist interpretation   Cardiac Monitoring: / EKG:  The patient was maintained on a cardiac monitor.  I personally viewed and interpreted the cardiac monitored which showed an  underlying rhythm of: ***   Consultations Obtained:  I requested consultation with the ***,  and discussed lab and imaging findings as well as pertinent plan - they recommend: ***   Problem List / ED Course / Critical interventions / Medication management  *** I ordered medication including ***  for ***  Reevaluation of the patient after these medicines showed that the patient {resolved/improved/worsened:23923::"improved"} I have reviewed the patients home medicines and have made adjustments as needed   Social Determinants of Health:  ***   Test / Admission - Considered:  ***   {Document critical care time when appropriate:1} {Document review of labs and clinical decision tools ie heart score, Chads2Vasc2 etc:1}  {Document your independent review of radiology images, and any outside records:1} {Document your discussion with family members, caretakers, and with consultants:1} {Document social determinants of health affecting pt's care:1} {Document your decision making why or why not admission, treatments were needed:1} Final Clinical Impression(s) / ED Diagnoses Final diagnoses:  None    Rx / DC Orders ED Discharge Orders     None

## 2024-01-03 NOTE — Discharge Instructions (Signed)
Your workup today was reassuring.  Please eat dinner when you get home and take your Lantus as prescribed.  Please follow-up tomorrow for dialysis.  Return to the ER for worsening symptoms.

## 2024-01-03 NOTE — ED Provider Notes (Signed)
68 year old female with past medical history of end-stage renal disease and diabetes presenting to the emergency department today with abdominal pain and hyperglycemia.  The patient does not appear to be in DKA.  Patient's initial glucose was significantly elevated.  Insulin infusion started.  Plan is for reassessment and likely discharge once patient's blood sugars are within a reasonable range as patient does have dialysis scheduled tomorrow.  Physical Exam  BP (!) 187/98   Pulse 86   Temp 98.8 F (37.1 C)   Resp 12   SpO2 100%   Physical Exam General: No acute distress  Procedures  Procedures  ED Course / MDM    Medical Decision Making Amount and/or Complexity of Data Reviewed Labs: ordered. Radiology: ordered.  Risk Prescription drug management.   On reassessment the patient's blood sugars are better.  She is feeling better.  She is scheduled for dialysis tomorrow.  I think that she is stable for discharge.       Durwin Glaze, MD 01/03/24 [phone]

## 2024-01-03 NOTE — ED Triage Notes (Signed)
Pt bib ems from home with [facility] reading hi. Call out was for abdominal pain. Denies nausea, vomiting or diarrhea. Hypertensive with ems, has not had her BP medications. MWF dialysis patient.

## 2024-01-12 ENCOUNTER — Other Ambulatory Visit: Payer: Self-pay | Admitting: Family Medicine

## 2024-01-12 ENCOUNTER — Other Ambulatory Visit: Payer: Self-pay | Admitting: Nephrology

## 2024-01-12 DIAGNOSIS — M810 Age-related osteoporosis without current pathological fracture: Secondary | ICD-10-CM

## 2024-01-12 DIAGNOSIS — Z1231 Encounter for screening mammogram for malignant neoplasm of breast: Secondary | ICD-10-CM

## 2024-01-18 ENCOUNTER — Emergency Department (HOSPITAL_COMMUNITY)
Admission: EM | Admit: 2024-01-18 | Discharge: 2024-01-18 | Disposition: A | Discharge status: Home / Self Care | Attending: Emergency Medicine | Admitting: Emergency Medicine

## 2024-01-18 ENCOUNTER — Emergency Department (HOSPITAL_COMMUNITY)

## 2024-01-18 ENCOUNTER — Other Ambulatory Visit: Payer: Self-pay

## 2024-01-18 ENCOUNTER — Encounter (HOSPITAL_COMMUNITY): Payer: Self-pay | Admitting: *Deleted

## 2024-01-18 DIAGNOSIS — E1140 Type 2 diabetes mellitus with diabetic neuropathy, unspecified: Secondary | ICD-10-CM | POA: Insufficient documentation

## 2024-01-18 DIAGNOSIS — I120 Hypertensive chronic kidney disease with stage 5 chronic kidney disease or end stage renal disease: Secondary | ICD-10-CM | POA: Insufficient documentation

## 2024-01-18 DIAGNOSIS — J029 Acute pharyngitis, unspecified: Secondary | ICD-10-CM | POA: Diagnosis present

## 2024-01-18 DIAGNOSIS — Z7984 Long term (current) use of oral hypoglycemic drugs: Secondary | ICD-10-CM | POA: Diagnosis not present

## 2024-01-18 DIAGNOSIS — E1122 Type 2 diabetes mellitus with diabetic chronic kidney disease: Secondary | ICD-10-CM | POA: Diagnosis not present

## 2024-01-18 DIAGNOSIS — Z794 Long term (current) use of insulin: Secondary | ICD-10-CM | POA: Insufficient documentation

## 2024-01-18 DIAGNOSIS — J0430 Supraglottitis, unspecified, without obstruction: Secondary | ICD-10-CM | POA: Insufficient documentation

## 2024-01-18 DIAGNOSIS — N186 End stage renal disease: Secondary | ICD-10-CM | POA: Diagnosis not present

## 2024-01-18 DIAGNOSIS — Z79899 Other long term (current) drug therapy: Secondary | ICD-10-CM | POA: Insufficient documentation

## 2024-01-18 DIAGNOSIS — Z992 Dependence on renal dialysis: Secondary | ICD-10-CM | POA: Insufficient documentation

## 2024-01-18 LAB — BASIC METABOLIC PANEL
Anion gap: 16 — ABNORMAL HIGH (ref 5–15)
BUN: 45 mg/dL — ABNORMAL HIGH (ref 8–23)
CO2: 23 mmol/L (ref 22–32)
Calcium: 8.3 mg/dL — ABNORMAL LOW (ref 8.9–10.3)
Chloride: 99 mmol/L (ref 98–111)
Creatinine, Ser: 4.95 mg/dL — ABNORMAL HIGH (ref 0.44–1.00)
GFR, Estimated: 9 mL/min — ABNORMAL LOW (ref >60)
Glucose, Bld: 106 mg/dL — ABNORMAL HIGH (ref 70–99)
Potassium: 3.6 mmol/L (ref 3.5–5.1)
Sodium: 138 mmol/L (ref 135–145)

## 2024-01-18 LAB — CBC
HCT: 32.7 % — ABNORMAL LOW (ref 36.0–46.0)
Hemoglobin: 10.6 g/dL — ABNORMAL LOW (ref 12.0–15.0)
MCH: 30.5 pg (ref 26.0–34.0)
MCHC: 32.4 g/dL (ref 30.0–36.0)
MCV: 94.2 fL (ref 80.0–100.0)
Platelets: 220 10*3/uL (ref 150–400)
RBC: 3.47 MIL/uL — ABNORMAL LOW (ref 3.87–5.11)
RDW: 13.7 % (ref 11.5–15.5)
WBC: 7.5 10*3/uL (ref 4.0–10.5)
nRBC: 0 % (ref 0.0–0.2)

## 2024-01-18 LAB — RESP PANEL BY RT-PCR (RSV, FLU A&B, COVID)  RVPGX2
Influenza A by PCR: NEGATIVE
Influenza B by PCR: NEGATIVE
Resp Syncytial Virus by PCR: NEGATIVE
SARS Coronavirus 2 by RT PCR: NEGATIVE

## 2024-01-18 LAB — GROUP A STREP BY PCR: Group A Strep by PCR: NOT DETECTED

## 2024-01-18 MED ORDER — IOHEXOL 350 MG/ML SOLN
75.0000 mL | Freq: Once | INTRAVENOUS | Status: AC | PRN
  Administered 2024-01-18: 75 mL via INTRAVENOUS

## 2024-01-18 MED ORDER — CLINDAMYCIN PHOSPHATE 900 MG/50ML IV SOLN
900.0000 mg | Freq: Once | INTRAVENOUS | Status: AC
  Administered 2024-01-18: 900 mg via INTRAVENOUS
  Filled 2024-01-18: qty 50

## 2024-01-18 MED ORDER — FENTANYL CITRATE PF 50 MCG/ML IJ SOSY
50.0000 ug | PREFILLED_SYRINGE | Freq: Once | INTRAMUSCULAR | Status: AC
  Administered 2024-01-18: 50 ug via INTRAVENOUS
  Filled 2024-01-18: qty 1

## 2024-01-18 MED ORDER — DEXAMETHASONE SODIUM PHOSPHATE 10 MG/ML IJ SOLN
10.0000 mg | Freq: Once | INTRAMUSCULAR | Status: AC
  Administered 2024-01-18: 10 mg via INTRAVENOUS
  Filled 2024-01-18: qty 1

## 2024-01-18 MED ORDER — CLINDAMYCIN HCL 150 MG PO CAPS: 300.0000 mg | ORAL_CAPSULE | Freq: Four times a day (QID) | ORAL | 0 refills | Status: AC

## 2024-01-18 NOTE — Discharge Instructions (Addendum)
 You were seen in the emergency department today for sore throat.  Your testing for strep, COVID, flu, and RSV was all negative.  Your blood work was reassuring.  Your CT scan showed swelling at your tonsils, and the radiologist recommended that we speak to an ear/nose/throat specialist.  After consulting with them, they recommend putting you on antibiotics.  We have already given you steroids to help with swelling.    I would like to send you home with more antibiotics for the next 10 days. Please take all of your antibiotics until finished!   You may develop abdominal discomfort or diarrhea from the antibiotic.  You may help offset this with probiotics which you can buy or get in yogurt. Do not eat or take the probiotics until 2 hours after your antibiotic.   I want you to see the ENT in clinic.  I have attached their contact information, please call them tomorrow and request an ER follow-up appointment.  Continue to monitor how you are doing and return to the ER for any new or worsening symptoms such as difficulty opening your mouth, or inability to keep down home medications.

## 2024-01-18 NOTE — ED Triage Notes (Signed)
 C/o sorethroat onset last week states it is worse today. Unable to eat states it hurts to bad

## 2024-01-18 NOTE — ED Notes (Signed)
 Patient transported to CT

## 2024-01-18 NOTE — ED Provider Notes (Signed)
 [place] EMERGENCY DEPARTMENT AT [facility] Provider Note   CSN: [id number] Arrival date & time: 01/18/24  0554     History  Chief Complaint  Patient presents with   Sore Throat    MILANIE Huynh is a 68 y.o. female with history of diabetes, hypertension, mood disorder, hyperlipidemia, diabetic neuropathy, seizures on Keppra, ESRD on hemodialysis MWF, who presents the emergency department complaining of sore throat for the past week.  Associated chills and rhinorrhea.  States that she is having difficulty tolerating solids and liquids.  She has not taken her p.o. meds in the past 2 days, but is continue to use her insulin.  Has been using her continuous glucose monitor, and her sugars have been normal.  She has not missed any dialysis.  She denies any difficulty opening her mouth or breathing.  Some difficulty clearing secretions.   Sore Throat       Home Medications Prior to Admission medications   Medication Sig Start Date End Date Taking? Authorizing Provider  albuterol (VENTOLIN HFA) 108 (90 Base) MCG/ACT inhaler Inhale 1-2 puffs into the lungs every 6 (six) hours as needed for wheezing or shortness of breath. 10/26/22   Derwood Kaplan, MD  amLODipine (NORVASC) 10 MG tablet Take 1 tablet (10 mg total) by mouth daily. 09/21/22 10/07/23  Lanae Boast, MD  atorvastatin (LIPITOR) 40 MG tablet Take 40 mg by mouth daily. 12/23/19   [provider]  carvedilol (COREG) 25 MG tablet Take 1 tablet (25 mg total) by mouth 2 (two) times daily with a meal. 09/21/22 10/07/23  Lanae Boast, MD  clindamycin (CLEOCIN) 150 MG capsule Take 2 capsules (300 mg total) by mouth 4 (four) times daily for 10 days. 01/18/24 01/28/24 Yes Reyce Lubeck T, PA-C  Continuous Blood Gluc Sensor (FREESTYLE LIBRE 3 SENSOR) MISC 1 each by Does not apply route 2 (two) times a week. Place 1 sensor on the skin every 14 days. Use to check glucose continuously 09/22/22   Lanae Boast, MD  estradiol (ESTRACE)  0.1 MG/GM vaginal cream Apply 1 gram per vagina every night for 2 weeks, then apply two times a week 10/07/23   Conan Bowens, MD  ferrous sulfate 325 (65 FE) MG tablet Take 1 tablet (325 mg total) by mouth daily with breakfast. Patient not taking: Reported on 10/07/2023 05/02/22   Marguerita Merles Latif, DO  gabapentin (NEURONTIN) 100 MG capsule Take 1 capsule (100 mg total) by mouth at bedtime. 07/28/22   Danford, Earl Lites, MD  hydrALAZINE (APRESOLINE) 50 MG tablet Take 50 mg by mouth 3 (three) times daily.    [provider]  insulin glargine (LANTUS SOLOSTAR) 100 UNIT/ML Solostar Pen Inject 5 Units into the skin daily. Discard pen after 28 days from first use 02/12/23   Ghimire, Werner Lean, MD  Insulin Pen Needle (TECHLITE PEN NEEDLES) 32G X 4 MM MISC Use to inject lantus once a day 07/28/22   Danford, Earl Lites, MD  metroNIDAZOLE (METROGEL) 0.75 % vaginal gel Place 1 Applicatorful vaginally at bedtime. Apply one applicatorful to vagina at bedtime for 5 days 10/07/23   Conan Bowens, MD  oseltamivir (TAMIFLU) 30 MG capsule Take 1 capsule (30 mg total) by mouth every dialysis. 12/08/23   Henderly, Britni A, PA-C  Vitamin D, Ergocalciferol, (DRISDOL) 1.25 MG (50000 UNIT) CAPS capsule Take 50,000 Units by mouth every 7 (seven) days. Patient not taking: Reported on 10/07/2023    [provider]  Allergies    Percocet [oxycodone-acetaminophen] and Vicodin [hydrocodone-acetaminophen]    Review of Systems   Review of Systems  Constitutional:  Positive for chills.  HENT:  Positive for rhinorrhea, sore throat and trouble swallowing.   All other systems reviewed and are negative.   Physical Exam Updated Vital Signs BP (!) 170/90 (BP Location: Right Arm)   Pulse 83   Temp 98.7 F (37.1 C) (Oral)   Resp 16   Ht 5\' 7"  (1.702 m)   Wt 49 kg   SpO2 99%   BMI 16.92 kg/m  Physical Exam Vitals and nursing note reviewed.  Constitutional:      Appearance: Normal  appearance.  HENT:     Head: Normocephalic and atraumatic.     Mouth/Throat:     Mouth: Mucous membranes are pale.     Comments: Difficult to visualize tonsils but from limited view, appear bilaterally enlarged, around 3+, no trismus, no obvious abscess Eyes:     Conjunctiva/sclera: Conjunctivae normal.  Cardiovascular:     Rate and Rhythm: Normal rate and regular rhythm.  Pulmonary:     Effort: Pulmonary effort is normal. No respiratory distress.     Breath sounds: Normal breath sounds.  Abdominal:     General: There is no distension.     Palpations: Abdomen is soft.     Tenderness: There is no abdominal tenderness.  Skin:    General: Skin is warm and dry.  Neurological:     General: No focal deficit present.     Mental Status: She is alert.     ED Results / Procedures / Treatments   Labs (all labs ordered are listed, but only abnormal results are displayed) Labs Reviewed  CBC - Abnormal; Notable for the following components:      Result Value   RBC 3.47 (*)    Hemoglobin 10.6 (*)    HCT 32.7 (*)    All other components within normal limits  BASIC METABOLIC PANEL - Abnormal; Notable for the following components:   Glucose, Bld 106 (*)    BUN 45 (*)    Creatinine, Ser 4.95 (*)    Calcium 8.3 (*)    GFR, Estimated 9 (*)    Anion gap 16 (*)    All other components within normal limits  GROUP A STREP BY PCR  RESP PANEL BY RT-PCR (RSV, FLU A&B, COVID)  [id number]    EKG None  Radiology CT Soft Tissue Neck W Contrast Result Date: 01/18/2024 CLINICAL DATA:  Soft tissue infection. EXAM: CT NECK WITH CONTRAST TECHNIQUE: Multidetector CT imaging of the neck was performed using the standard protocol following the bolus administration of intravenous contrast. RADIATION DOSE REDUCTION: This exam was performed according to the departmental dose-optimization program which includes automated exposure control, adjustment of the mA and/or kV according to patient size and/or use of  iterative reconstruction technique. CONTRAST:  75mL OMNIPAQUE IOHEXOL 350 MG/ML SOLN COMPARISON:  CTA neck 01/22/2018 FINDINGS: Pharynx and larynx: There is enhancement and mild prominence of the palatine and lingual tonsils without striated appearance or evidence of peritonsillar abscess. Normal appearance of the oral cavity and floor of mouth. There is subtle thickening of the epiglottis which may reflect reactive edema. There is swelling noted along the wall of the hypopharynx with areas of hypoattenuation. Mild mucosal enhancement in this region. No evidence of focal abscess formation. There is additional edematous and thickened appearance of the aryepiglottic folds. The airway remains patent. There is mild stranding noted in  the paraglottic fat. The vocal folds are symmetric. Salivary glands: No inflammation, mass, or stone. Thyroid: Subcentimeter nodules in the thyroid particularly in the right thyroid lobe. Lymph nodes: None enlarged or abnormal density. Vascular: Atherosclerosis at the right carotid bifurcation. No findings to suggest high-grade stenosis in the neck. Limited intracranial: Negative. Visualized orbits: Bilateral lens replacement. Orbits are symmetric. Mastoids and visualized paranasal sinuses: Clear. Skeleton: No acute or aggressive finding. Degenerative changes in the cervical spine with mild disc space narrowing greatest at C6-7. Upper chest: Negative. Other: None. IMPRESSION: 1. Findings consistent with pharyngitis and supraglottitis. Hypoattenuation along the hypopharynx which could reflect phlegmonous change without evidence of focal abscess. Recommend ENT consultation. 2. Mild prominence of the palatine and lingual tonsils without evidence of peritonsillar abscess. Electronically Signed   By: Emily Filbert M.D.   On: 01/18/2024 12:48    Procedures Procedures    Medications Ordered in ED Medications  clindamycin (CLEOCIN) IVPB 900 mg (900 mg Intravenous New Bag/Given 01/18/24 1448)   dexamethasone (DECADRON) injection 10 mg (10 mg Intravenous Given 01/18/24 0804)  fentaNYL (SUBLIMAZE) injection 50 mcg (50 mcg Intravenous Given 01/18/24 0931)  iohexol (OMNIPAQUE) 350 MG/ML injection 75 mL (75 mLs Intravenous Contrast Given 01/18/24 1124)    ED Course/ Medical Decision Making/ A&P Clinical Course as of 01/18/24 1450  Mon Jan 18, 2024  0919 Discussed case with attending Dr Suezanne Jacquet. Agrees with CT scan to rule out deep space infection. Discussed with patient who consents to contrasted study.  [LR]    Clinical Course User Index [LR] Mohamed Portlock, Lora Paula, PA-C                                 Medical Decision Making Amount and/or Complexity of Data Reviewed Labs: ordered. Radiology: ordered.  Risk Prescription drug management.   This patient is a 68 y.o. female  who presents to the ED for concern of sore throat x 1 week.   Differential diagnoses prior to evaluation: The emergent differential diagnosis includes, but is not limited to,  Viral pharyngitis, strep pharyngitis, dental caries/abscess, esophagitis, sinusitis, post nasal drip, reflux, angioedema, RTA/PTA, Ludwig's angina. This is not an exhaustive differential.   Past Medical History / Co-morbidities / Social History: diabetes, hypertension, mood disorder, hyperlipidemia, diabetic neuropathy, seizures on Keppra, ESRD on hemodialysis MWF  Additional history: Chart reviewed. Pertinent results include: Follows with [facility], she is awaiting organ transplant status  Physical Exam: Physical exam performed. The pertinent findings include: Hypertensive, otherwise normal vitals, no acute distress. Difficult oropharyngeal exam due to tongue size, but appear to have bilateral tonsillar swelling without abscess. No sublingual or submandibular edema.   Lab Tests/Imaging studies: I personally interpreted labs/imaging and the pertinent results include:  No leukocytosis, hemoglobin stable.  BMP with stable kidney  function compared to prior.  Respiratory panel negative, group A strep negative.  CT soft tissue with contrast performed, with patient consent, shows findings consistent with pharyngitis and supraglottitis, as well as hypoattenuation along the hypopharynx, without abscess.  Radiologist recommended formal ENT consultation.I agree with the radiologist interpretation.  Medications: I ordered medication including decadron and ABX per ENT. Will utilize patient's continuous glucose monitor.  I have reviewed the patients home medicines and have made adjustments as needed. On reevaluation, patient feels much better, tolerated PO.   Consultations obtained: I consulted with ENT Dr Suszanne Conners who reviewed patient's CT scan: recommends treating similar to PTA. Pt already received  10 mg decadron IV, he recommends 900 mg IV clindamycin, and discharging with 300 mg clindamycin QID x 10 days and following up with his clinic.    Disposition: After consideration of the diagnostic results and the patients response to treatment, I feel that emergency department workup does not suggest an emergent condition requiring admission or immediate intervention beyond what has been performed at this time. The plan is: Discharge to home.  Will send home with antibiotics per ENT recommendation for pharyngitis and supraglottitis. The patient is safe for discharge and has been instructed to return immediately for worsening symptoms, change in symptoms or any other concerns.  Final Clinical Impression(s) / ED Diagnoses Final diagnoses:  Supraglottitis without airway obstruction  Pharyngitis, unspecified etiology    Rx / DC Orders ED Discharge Orders          Ordered    clindamycin (CLEOCIN) 150 MG capsule  4 times daily        01/18/24 1442           Portions of this report may have been transcribed using voice recognition software. Every effort was made to ensure accuracy; however, inadvertent computerized transcription  errors may be present.    Jeanella Flattery 01/18/24 1450    Lonell Grandchild, MD 01/18/24 [phone]

## 2024-02-03 ENCOUNTER — Encounter (HOSPITAL_COMMUNITY): Payer: Self-pay

## 2024-02-23 NOTE — Progress Notes (Signed)
 [facility] - Clinic Note  03/08/2024   CHIEF COMPLAINT Patient presents for Retina Evaluation  HISTORY OF PRESENT ILLNESS: Tricia Huynh is a 68 y.o. female who presents to the clinic today for:  HPI     Retina Evaluation   In both eyes.  This started 20 years ago.  Associated Symptoms Flashes and Floaters.  I, the attending physician,  performed the HPI with the patient and updated documentation appropriately.        Comments   Patient is here today based on a referral for a diabetic evaluation. She has noticed a decrease in her vision. The vision is blurry. She does not use eye drops. Her blood sugar was 309.       Last edited by Ronelle Coffee, MD on 03/08/2024 11:50 PM.     Patient states that she has noticed the vision is blurry.  Referring physician: Olin Bertin, MD [address] [place],  [place] [address]  HISTORICAL INFORMATION:  Selected notes from the MEDICAL RECORD NUMBER Referred by Dr. Merlyn Starring:  Ocular Hx- PMH-   CURRENT MEDICATIONS: No current outpatient medications on file. (Ophthalmic Drugs)   No current facility-administered medications for this visit. (Ophthalmic Drugs)   Current Outpatient Medications (Other)  Medication Sig   albuterol  (VENTOLIN  HFA) 108 (90 Base) MCG/ACT inhaler Inhale 1-2 puffs into the lungs every 6 (six) hours as needed for wheezing or shortness of breath.   amLODipine  (NORVASC ) 10 MG tablet Take 1 tablet (10 mg total) by mouth daily.   atorvastatin  (LIPITOR) 40 MG tablet Take 40 mg by mouth daily.   carvedilol  (COREG ) 25 MG tablet Take 1 tablet (25 mg total) by mouth 2 (two) times daily with a meal.   Continuous Blood Gluc Sensor (FREESTYLE LIBRE 3 SENSOR) MISC 1 each by Does not apply route 2 (two) times a week. Place 1 sensor on the skin every 14 days. Use to check glucose continuously   estradiol  (ESTRACE ) 0.1 MG/GM vaginal cream Apply 1 gram per vagina every night for 2 weeks, then apply two  times a week   ferrous sulfate  325 (65 FE) MG tablet Take 1 tablet (325 mg total) by mouth daily with breakfast. (Patient not taking: Reported on 10/07/2023)   gabapentin  (NEURONTIN ) 100 MG capsule Take 1 capsule (100 mg total) by mouth at bedtime.   hydrALAZINE  (APRESOLINE ) 50 MG tablet Take 50 mg by mouth 3 (three) times daily.   insulin  glargine (LANTUS  SOLOSTAR) 100 UNIT/ML Solostar Pen Inject 5 Units into the skin daily. Discard pen after 28 days from first use   Insulin  Pen Needle (TECHLITE PEN NEEDLES) 32G X 4 MM MISC Use to inject lantus  once a day   metroNIDAZOLE  (METROGEL ) 0.75 % vaginal gel Place 1 Applicatorful vaginally at bedtime. Apply one applicatorful to vagina at bedtime for 5 days   oseltamivir  (TAMIFLU ) 30 MG capsule Take 1 capsule (30 mg total) by mouth every dialysis.   Vitamin D, Ergocalciferol, (DRISDOL) 1.25 MG (50000 UNIT) CAPS capsule Take 50,000 Units by mouth every 7 (seven) days. (Patient not taking: Reported on 10/07/2023)   No current facility-administered medications for this visit. (Other)   REVIEW OF SYSTEMS: ROS   Positive for: Gastrointestinal, Neurological, Endocrine Last edited by Olene Berne, COT on 03/08/2024 12:46 PM.     ALLERGIES Allergies  Allergen Reactions   Percocet [Oxycodone-Acetaminophen ] Nausea And Vomiting and Other (See Comments)    Causes the patient to be shaky/unsteady, also  Vicodin [Hydrocodone-Acetaminophen ] Nausea And Vomiting and Other (See Comments)    Causes the patient to be shaky/unsteady, also   PAST MEDICAL HISTORY Past Medical History:  Diagnosis Date   Anemia of chronic kidney failure, stage 4 (severe) (HCC)    Chronic kidney disease    Diabetes mellitus type 2 in nonobese (HCC) 06/06/2015   Diabetes mellitus without complication (HCC)    Diabetic neuropathy (HCC) 06/06/2015   Dyslipidemia 06/06/2015   Encephalopathy    Essential hypertension 06/06/2015   Hypertension    Mood disorder (HCC) 06/06/2015    Pneumonia 03/11/2017   Seizure (HCC)    sees Gracie Lav. abd eeg, on keppra    Past Surgical History:  Procedure Laterality Date   ABDOMINAL HYSTERECTOMY     AV FISTULA PLACEMENT Left 02/12/2023   Procedure: LEFT ARM BRACHIOCEPHALIC ARTERIOVENOUS (AV) FISTULA CREATION;  Surgeon: Kayla Part, MD;  Location: [facility] OR;  Service: Vascular;  Laterality: Left;   EYE SURGERY Bilateral    IR FLUORO GUIDE CV LINE RIGHT  02/06/2023   IR US  GUIDE VASC ACCESS RIGHT  02/06/2023   TUBAL LIGATION     FAMILY HISTORY Family History  Problem Relation Age of Onset   Heart disease Father    Kidney disease Father    Diabetes Mother    Seizures Sister    Seizures Brother    SOCIAL HISTORY Social History   Tobacco Use   Smoking status: Former    Types: Cigarettes   Smokeless tobacco: Never  Vaping Use   Vaping status: Never Used  Substance Use Topics   Alcohol use: No   Drug use: No       OPHTHALMIC EXAM:  Base Eye Exam     Visual Acuity (Snellen - Linear)       Right Left   Dist [place] 20/25 20/20   Dist ph [place] NI          Tonometry (Tonopen, 12:48 PM)       Right Left   Pressure 19 17         Pupils       Dark Light Shape React APD   Right 2 1 Round Slow None   Left 2 1 Round Slow None         Visual Fields       Left Right     Full         Extraocular Movement       Right Left    Full, Ortho Full, Ortho         Neuro/Psych     Oriented x3: Yes         Dilation     Both eyes: 1.0% Mydriacyl, 2.5% Phenylephrine  @ 12:46 PM           Slit Lamp and Fundus Exam     External Exam       Right Left   External Normal Normal         Slit Lamp Exam       Right Left   Lids/Lashes Dermatochalasis - upper lid Dermatochalasis - upper lid   Conjunctiva/Sclera Melanosis Melanosis   Cornea Arcus, 1+ Punctate epithelial erosions, Well healed temporal cataract wound Arcus, trace Punctate epithelial erosions, Well healed temporal cataract wound    Anterior Chamber Deep and clear Deep and clear   Iris Round and moderately dilated, No NVI Round and moderately dilated, No NVI   Lens PC IOL in good postition, 1+ Posterior capsular opacification PC  IOL in good postition   Anterior Vitreous Vitreous syneresis Vitreous syneresis         Fundus Exam       Right Left   Disc Pink and sharp Pink and sharp, PPP   C/D Ratio 0.3 0.5   Macula Flat, Good foveal reflex, central cystic changes/edema, scattered MA/DBH Flat, Blunted foveal reflex, central cystic changes/edema, scattered MA/DBH   Vessels Vascular attenuation, Tortuous Vascular attenuation, Tortuous   Periphery Attached, posterior scattered MA/DBH Attached, posterior scattered MA/DBH           IMAGING AND PROCEDURES  Imaging and Procedures for 03/08/2024  OCT, Retina - OU - Both Eyes       Right Eye Quality was good. Central Foveal Thickness: 224. Progression has no prior data. Findings include normal foveal contour, no SRF, intraretinal hyper-reflective material (Mild IRF/edema nasal and temp macula, partial PVD).   Left Eye Quality was good. Central Foveal Thickness: 266. Progression has no prior data. Findings include normal foveal contour, no SRF, intraretinal hyper-reflective material (Mild IRF/edema nasal and temp macula).   Notes *Images captured and stored on drive  Diagnosis / Impression:  OU:Mild IRF/edema nasal and temp macula OD:  partial PVD  Clinical management:  See below  Abbreviations: NFP - Normal foveal profile. CME - cystoid macular edema. PED - pigment epithelial detachment. IRF - intraretinal fluid. SRF - subretinal fluid. EZ - ellipsoid zone. ERM - epiretinal membrane. ORA - outer retinal atrophy. ORT - outer retinal tubulation. SRHM - subretinal hyper-reflective material. IRHM - intraretinal hyper-reflective material      Fluorescein  Angiography Optos (Transit OD)       Right Eye Progression has no prior data. Early phase findings include  microaneurysm, vascular perfusion defect. Mid/Late phase findings include leakage, microaneurysm, vascular perfusion defect (Scattered patches of vascular non profusion, scattered MA with late leakage posteriorly, no NV).   Left Eye Progression has no prior data. Early phase findings include microaneurysm, vascular perfusion defect. Mid/Late phase findings include leakage, microaneurysm, vascular perfusion defect (Scattered patches of vascular non profusion, scattered MA with late leakage posteriorly, no NV).   Notes *Images captured and stored on drive  Diagnosis / Impression:  OU: Scattered patches of vascular non profusion, scattered MA with late leakage posteriorly, no NV   Clinical management:  See below  Abbreviations: NFP - Normal foveal profile. CME - cystoid macular edema. PED - pigment epithelial detachment. IRF - intraretinal fluid. SRF - subretinal fluid. EZ - ellipsoid zone. ERM - epiretinal membrane. ORA - outer retinal atrophy. ORT - outer retinal tubulation. SRHM - subretinal hyper-reflective material. IRHM - intraretinal hyper-reflective material      Intravitreal Injection, Pharmacologic Agent - OD - Right Eye       Time Out 03/08/2024. 2:41 PM. Confirmed correct patient, procedure, site, and patient consented.   Anesthesia Topical anesthesia was used. Anesthetic medications included Lidocaine  2%, Proparacaine 0.5%.   Procedure Injection: 1.25 mg Bevacizumab  1.25mg /0.28ml   Route: Intravitreal, Site: Right Eye   NDC: [id number], Lot: [id number], Expiration date: 04/02/2024           ASSESSMENT/PLAN:   ICD-10-CM   1. Moderate nonproliferative diabetic retinopathy of both eyes with macular edema associated with type 2 diabetes mellitus (HCC)  E11.3313 OCT, Retina - OU - Both Eyes    Fluorescein  Angiography Optos (Transit OD)    Intravitreal Injection, Pharmacologic Agent - OD - Right Eye    2. Encounter for long-term (current) use of insulin  (HCC)  Z79.4  3. Hypertensive retinopathy of both eyes  H35.033     4. Essential hypertension  I10     5. Pseudophakia  Z96.1      1,2. moderate Non-proliferative diabetic retinopathy, both eyes  - A1C 10.0 (02.18.25)  - BCVA OD 20/25, OS 20/20  - Dialysis - Mon, Wed, and Friday  - OCT shows: OU: Mild IRF/edema nasal and temp macula - FA today shows: [id number] patches of vascular non profusion, scattered MA with late leakage posteriorly, no NV: - The incidence, risk factors for progression, natural history and treatment options for diabetic retinopathy were discussed with patient.   - The need for close monitoring of blood glucose, blood pressure, and serum lipids, avoiding cigarette or any type of tobacco, and the need for long term follow up was also discussed with patient. - The natural history, pathology, and characteristics of diabetic macular edema discussed with patient.  A generalized discussion of the major clinical trials concerning treatment of diabetic macular edema (ETDRS, DCT, SCORE, RISE / RIDE, and ongoing DRCR net studies) was completed.  This discussion included mention of the various approaches to treating diabetic macular edema (observation, laser photocoagulation, anti-VEGF injections with lucentis / Avastin  / Eylea, steroid injections with Kenalog / Ozurdex , and intraocular surgery with vitrectomy).  The goal hemoglobin A1C of 6-7 was discussed, as well as importance of smoking cessation and hypertension control.  Need for ongoing treatment and monitoring were specifically discussed with reference to chronic nature of diabetic macular edema. - recommend IVA #1 OD today, 04.22.25 and follow up in 4 weeks - pt wishes to proceed - RBA of procedure discussed, questions answered - informed consent obtained and signed OU 04.22.25 - see procedure note - f/u in 4 wks -- DFE/OCT, possible injection    3,4. Hypertensive retinopathy OU - discussed importance of tight BP control - monitor    5. Pseudophakia OU  - s/p CE/IOL  - IOL in good position, doing well  - monitor   Ophthalmic Meds Ordered this visit:  No orders of the defined types were placed in this encounter.    Return in about 4 weeks (around 04/05/2024) for f/u NPDR OU , DFE, OCT, Possible, IVA, OD.  There are no Patient Instructions on file for this visit.  Explained the diagnoses, plan, and follow up with the patient and they expressed understanding.  Patient expressed understanding of the importance of proper follow up care.   This document serves as a record of services personally performed by Jeanice Millard, MD, PhD. It was created on their behalf by Olene Berne, COT an ophthalmic technician. The creation of this record is the provider's dictation and/or activities during the visit.    Electronically signed by:  Olene Berne, COT  03/08/24 11:50 PM  This document serves as a record of services personally performed by Jeanice Millard, MD, PhD. It was created on their behalf by Morley Arabia. Bevin Bucks, OA an ophthalmic technician. The creation of this record is the provider's dictation and/or activities during the visit.    Electronically signed by: Morley Arabia. Bevin Bucks, OA 03/08/24 11:50 PM  Jeanice Millard, M.D., Ph.D. Diseases & Surgery of the Retina and Vitreous [facility] 03/08/2024  I have reviewed the above documentation for accuracy and completeness, and I agree with the above. Jeanice Millard, M.D., Ph.D. 03/08/24 11:52 PM   Abbreviations: M myopia (nearsighted); A astigmatism; H hyperopia (farsighted); P presbyopia; Mrx spectacle prescription;  CTL  contact lenses; OD right eye; OS left eye; OU both eyes  XT exotropia; ET esotropia; PEK punctate epithelial keratitis; PEE punctate epithelial erosions; DES dry eye syndrome; MGD meibomian gland dysfunction; ATs artificial tears; PFAT's preservative free artificial tears; NSC nuclear sclerotic cataract; PSC posterior  subcapsular cataract; ERM epi-retinal membrane; PVD posterior vitreous detachment; RD retinal detachment; DM diabetes mellitus; DR diabetic retinopathy; NPDR non-proliferative diabetic retinopathy; PDR proliferative diabetic retinopathy; CSME clinically significant macular edema; DME diabetic macular edema; dbh dot blot hemorrhages; CWS cotton wool spot; POAG primary open angle glaucoma; C/D cup-to-disc ratio; HVF humphrey visual field; GVF goldmann visual field; OCT optical coherence tomography; IOP intraocular pressure; BRVO Branch retinal vein occlusion; CRVO central retinal vein occlusion; CRAO central retinal artery occlusion; BRAO branch retinal artery occlusion; RT retinal tear; SB scleral buckle; PPV pars plana vitrectomy; VH Vitreous hemorrhage; PRP panretinal laser photocoagulation; IVK intravitreal kenalog; VMT vitreomacular traction; MH Macular hole;  NVD neovascularization of the disc; NVE neovascularization elsewhere; AREDS age related eye disease study; ARMD age related macular degeneration; POAG primary open angle glaucoma; EBMD epithelial/anterior basement membrane dystrophy; ACIOL anterior chamber intraocular lens; IOL intraocular lens; PCIOL posterior chamber intraocular lens; Phaco/IOL phacoemulsification with intraocular lens placement; PRK photorefractive keratectomy; LASIK laser assisted in situ keratomileusis; HTN hypertension; DM diabetes mellitus; COPD chronic obstructive pulmonary disease

## 2024-02-24 ENCOUNTER — Encounter (HOSPITAL_COMMUNITY): Payer: Self-pay

## 2024-03-08 ENCOUNTER — Encounter (INDEPENDENT_AMBULATORY_CARE_PROVIDER_SITE_OTHER): Payer: Self-pay | Admitting: Ophthalmology

## 2024-03-08 ENCOUNTER — Ambulatory Visit (INDEPENDENT_AMBULATORY_CARE_PROVIDER_SITE_OTHER): Admitting: Ophthalmology

## 2024-03-08 VITALS — BP 174/93 | HR 76

## 2024-03-08 DIAGNOSIS — Z794 Long term (current) use of insulin: Secondary | ICD-10-CM | POA: Diagnosis not present

## 2024-03-08 DIAGNOSIS — I10 Essential (primary) hypertension: Secondary | ICD-10-CM | POA: Diagnosis not present

## 2024-03-08 DIAGNOSIS — H3581 Retinal edema: Secondary | ICD-10-CM

## 2024-03-08 DIAGNOSIS — H35033 Hypertensive retinopathy, bilateral: Secondary | ICD-10-CM | POA: Diagnosis not present

## 2024-03-08 DIAGNOSIS — Z961 Presence of intraocular lens: Secondary | ICD-10-CM

## 2024-03-08 DIAGNOSIS — E113313 Type 2 diabetes mellitus with moderate nonproliferative diabetic retinopathy with macular edema, bilateral: Secondary | ICD-10-CM

## 2024-03-08 MED ORDER — BEVACIZUMAB CHEMO INJECTION 1.25MG/0.05ML SYRINGE FOR KALEIDOSCOPE
1.2500 mg | INTRAVITREAL | Status: AC | PRN
  Administered 2024-03-08: 1.25 mg via INTRAVITREAL

## 2024-03-24 NOTE — Progress Notes (Signed)
 [facility] - Clinic Note  04/05/2024   CHIEF COMPLAINT Patient presents for Retina Follow Up  HISTORY OF PRESENT ILLNESS: Tricia Huynh is a 68 y.o. female who presents to the clinic today for:  HPI     Retina Follow Up   Patient presents with  Diabetic Retinopathy.  In both eyes.  This started 4 weeks ago.  Duration of 4 weeks.  Since onset it is gradually worsening.  I, the attending physician,  performed the HPI with the patient and updated documentation appropriately.        Comments   4 week retina follow up NPDR  and IVA OD pt is reporting no vision changes noticed she is having some floaters in OD denies any flashes her last reading 246 in office       Last edited by Ronelle Coffee, MD on 04/05/2024  5:25 PM.    Patient states she has been seeing floaters since the first injection  Referring physician: Olin Bertin, MD [address] [place],  [place] [address]  HISTORICAL INFORMATION:  Selected notes from the MEDICAL RECORD NUMBER Referred by Dr. Alexia Angelucci at [facility] for diabetic eye exam LEE:  Ocular Hx- PMH-   CURRENT MEDICATIONS: No current outpatient medications on file. (Ophthalmic Drugs)   No current facility-administered medications for this visit. (Ophthalmic Drugs)   Current Outpatient Medications (Other)  Medication Sig   albuterol  (VENTOLIN  HFA) 108 (90 Base) MCG/ACT inhaler Inhale 1-2 puffs into the lungs every 6 (six) hours as needed for wheezing or shortness of breath.   amLODipine  (NORVASC ) 10 MG tablet Take 1 tablet (10 mg total) by mouth daily.   atorvastatin  (LIPITOR) 40 MG tablet Take 40 mg by mouth daily.   carvedilol  (COREG ) 25 MG tablet Take 1 tablet (25 mg total) by mouth 2 (two) times daily with a meal.   Continuous Blood Gluc Sensor (FREESTYLE LIBRE 3 SENSOR) MISC 1 each by Does not apply route 2 (two) times a week. Place 1 sensor on the skin every 14 days. Use to check glucose continuously    estradiol  (ESTRACE ) 0.1 MG/GM vaginal cream Apply 1 gram per vagina every night for 2 weeks, then apply two times a week   ferrous sulfate  325 (65 FE) MG tablet Take 1 tablet (325 mg total) by mouth daily with breakfast. (Patient not taking: Reported on 10/07/2023)   gabapentin  (NEURONTIN ) 100 MG capsule Take 1 capsule (100 mg total) by mouth at bedtime.   hydrALAZINE  (APRESOLINE ) 50 MG tablet Take 50 mg by mouth 3 (three) times daily.   insulin  glargine (LANTUS  SOLOSTAR) 100 UNIT/ML Solostar Pen Inject 5 Units into the skin daily. Discard pen after 28 days from first use   Insulin  Pen Needle (TECHLITE PEN NEEDLES) 32G X 4 MM MISC Use to inject lantus  once a day   metroNIDAZOLE  (METROGEL ) 0.75 % vaginal gel Place 1 Applicatorful vaginally at bedtime. Apply one applicatorful to vagina at bedtime for 5 days   oseltamivir  (TAMIFLU ) 30 MG capsule Take 1 capsule (30 mg total) by mouth every dialysis.   Vitamin D, Ergocalciferol, (DRISDOL) 1.25 MG (50000 UNIT) CAPS capsule Take 50,000 Units by mouth every 7 (seven) days. (Patient not taking: Reported on 10/07/2023)   No current facility-administered medications for this visit. (Other)   REVIEW OF SYSTEMS: ROS   Positive for: Gastrointestinal, Neurological, Endocrine Last edited by Alise Appl, COT on 04/05/2024 12:56 PM.      ALLERGIES Allergies  Allergen  Reactions   Percocet [Oxycodone-Acetaminophen ] Nausea And Vomiting and Other (See Comments)    Causes the patient to be shaky/unsteady, also   Vicodin [Hydrocodone-Acetaminophen ] Nausea And Vomiting and Other (See Comments)    Causes the patient to be shaky/unsteady, also   PAST MEDICAL HISTORY Past Medical History:  Diagnosis Date   Anemia of chronic kidney failure, stage 4 (severe) (HCC)    Chronic kidney disease    Diabetes mellitus type 2 in nonobese (HCC) 06/06/2015   Diabetes mellitus without complication (HCC)    Diabetic neuropathy (HCC) 06/06/2015   Dyslipidemia  06/06/2015   Encephalopathy    Essential hypertension 06/06/2015   Hypertension    Mood disorder (HCC) 06/06/2015   Pneumonia 03/11/2017   Seizure (HCC)    sees Gracie Lav. abd eeg, on keppra    Past Surgical History:  Procedure Laterality Date   A/V SHUNT INTERVENTION N/A 04/07/2024   Procedure: A/V SHUNT INTERVENTION;  Surgeon: Kayla Part, MD;  Location: [facility];  Service: Cardiovascular;  Laterality: N/A;   ABDOMINAL HYSTERECTOMY     AV FISTULA PLACEMENT Left 02/12/2023   Procedure: LEFT ARM BRACHIOCEPHALIC ARTERIOVENOUS (AV) FISTULA CREATION;  Surgeon: Kayla Part, MD;  [facility];  Service: Vascular;  Laterality: Left;   EYE SURGERY Bilateral    IR FLUORO GUIDE CV LINE RIGHT  02/06/2023   IR US  GUIDE VASC ACCESS RIGHT  02/06/2023   TUBAL LIGATION     VENOUS ANGIOPLASTY  04/07/2024   Procedure: VENOUS ANGIOPLASTY;  Surgeon: Kayla Part, MD;  Location: [facility];  Service: Cardiovascular;;   FAMILY HISTORY Family History  Problem Relation Age of Onset   Heart disease Father    Kidney disease Father    Diabetes Mother    Seizures Sister    Seizures Brother    SOCIAL HISTORY Social History   Tobacco Use   Smoking status: Former    Types: Cigarettes   Smokeless tobacco: Never  Vaping Use   Vaping status: Never Used  Substance Use Topics   Alcohol use: No   Drug use: No       OPHTHALMIC EXAM:  Base Eye Exam     Visual Acuity (Snellen - Linear)       Right Left   Dist [place] 20/25 20/20 -1   Dist ph [place] NI          Tonometry (Tonopen, 1:00 PM)       Right Left   Pressure 22 18  Squeezing         Pupils       Pupils Dark Light Shape React APD   Right PERRL 2 1 Round Brisk None   Left PERRL 2 1 Round Brisk None         Visual Fields       Left Right    Full Full         Extraocular Movement       Right Left    Full, Ortho Full, Ortho         Neuro/Psych     Oriented x3: Yes   Mood/Affect: Normal         Dilation      Both eyes: 2.5% Phenylephrine  @ 1:00 PM           Slit Lamp and Fundus Exam     External Exam       Right Left   External Normal Normal         Slit  Lamp Exam       Right Left   Lids/Lashes Dermatochalasis - upper lid Dermatochalasis - upper lid   Conjunctiva/Sclera Melanosis Melanosis   Cornea Arcus, 1+ Punctate epithelial erosions, Well healed temporal cataract wound Arcus, trace Punctate epithelial erosions, Well healed temporal cataract wound   Anterior Chamber Deep and clear Deep and clear   Iris Round and moderately dilated, No NVI Round and moderately dilated, No NVI   Lens PC IOL in good postition, 1+ Posterior capsular opacification PC IOL in good postition   Anterior Vitreous Vitreous syneresis, Posterior vitreous detachment Vitreous syneresis         Fundus Exam       Right Left   Disc Pink and sharp Pink and sharp, PPP   C/D Ratio 0.3 0.5   Macula Flat, Good foveal reflex, central cystic changes/edema -- slightly improved, scattered MA/DBH Flat, Blunted foveal reflex, central cystic changes/edema -- slightly improved, scattered MA/DBH   Vessels attenuated, Tortuous attenuated, Tortuous   Periphery Attached, posterior scattered MA/DBH, pigmented CR scar IN periphery, No RT/RD Attached, posterior scattered MA/DBH           IMAGING AND PROCEDURES  Imaging and Procedures for 04/05/2024  PERIPHERAL VASCULAR CATHETERIZATION           Patient name: Tricia Huynh MRN: [id number] DOB: [birth year] Sex: female  04/07/2024 Pre-operative Diagnosis: Left arm fistula malfunction Post-operative diagnosis:  Same Surgeon:  Kayla Part, MD Procedure Performed: 1.  Ultrasound-guided micropuncture access of the left arm brachiocephalic fistula 2.  Fistulogram 3.  Balloon venoplasty 7 x 40 mm brachiocephalic fistula 4.  Access manage with Monocryl suture   Indications: Patient is a 68 year old female with low flows at dialysis.  After discussing risk  and benefits of left arm fistulogram in effort to define flow, and possibly treat areas of stenosis, Milli elected to proceed.  Findings:   Widely patent arterial anastomosis, 3 cm tapering stenosis roughly 5 cm from the anastomosis with greater than 70% stenosis Otherwise, fistula widely patent.  No outflow stenosis   Procedure:  The patient was identified in the holding area and taken to room 8.  The patient was then placed supine on the table and prepped and draped in the usual sterile fashion.  A time out was called.  Ultrasound was used to evaluate the left arm brachiocephalic fistula.  The patient was accessed roughly 2 cm from the anastomosis with a micropuncture needle in antegrade fashion.  This was exchanged for a micropuncture sheath.  Fistulogram followed.  I elected to treat to the flow-limiting stenosis.  The patient was heparinized and a 7 x 40 mm balloon was brought onto the field.  This was laid across the lesion and inflated for 3 minutes.  In total, 2 inflations were completed.  Follow-up angiography demonstrated excellent result with resolution of flow-limiting stenosis.  Patient had an excellent thrill in the fistula case completion.  Access was managed with Monocryl suture.  Impression: Successful balloon venoplasty of the brachiocephalic fistula with 7 mm balloon Fistula remains amenable to further intervention Can use access.   Kayla Part MD [facility] [place] Office: [phone]      OCT, Retina - OU - Both Eyes        Right Eye Quality was good. Central Foveal Thickness: 222. Progression has improved. Findings include normal foveal contour, no SRF, intraretinal hyper-reflective material (Persistent mild IRF/edema nasal and temp macula -- slightly improved, interval release of partial  PVD).   Left Eye Quality was good. Central Foveal Thickness: 246. Progression has improved. Findings include normal foveal contour, no SRF,  intraretinal hyper-reflective material (Mild IRF/edema nasal and temp macula -- improved).   Notes  *Images captured and stored on drive  Diagnosis / Impression:  OD: Persistent mild IRF/edema nasal and temp macula -- slightly improved, interval release of partial PVD OS: Mild IRF/edema nasal and temp macula -- improved  Clinical management:  See below  Abbreviations: NFP - Normal foveal profile. CME - cystoid macular edema. PED - pigment epithelial detachment. IRF - intraretinal fluid. SRF - subretinal fluid. EZ - ellipsoid zone. ERM - epiretinal membrane. ORA - outer retinal atrophy. ORT - outer retinal tubulation. SRHM - subretinal hyper-reflective material. IRHM - intraretinal hyper-reflective material      Intravitreal Injection, Pharmacologic Agent - OD - Right Eye       Time Out 04/05/2024. 1:30 PM. Confirmed correct patient, procedure, site, and patient consented.   Anesthesia Topical anesthesia was used. Anesthetic medications included Lidocaine  2%, Proparacaine 0.5%.   Procedure Preparation included 5% betadine to ocular surface, eyelid speculum. A supplied needle was used.   Injection: 1.25 mg Bevacizumab  1.25mg /0.21ml   Route: Intravitreal, Site: Right Eye   NDC: [id number], Lot: [id number], Expiration date: 05/06/2024   Post-op Post injection exam found visual acuity of at least counting fingers. The patient tolerated the procedure well. There were no complications. The patient received written and verbal post procedure care education.           ASSESSMENT/PLAN:   ICD-10-CM   1. Moderate nonproliferative diabetic retinopathy of both eyes with macular edema associated with type 2 diabetes mellitus (HCC)  E11.3313 OCT, Retina - OU - Both Eyes    Intravitreal Injection, Pharmacologic Agent - OD - Right Eye    Bevacizumab  (AVASTIN ) SOLN 1.25 mg    2. Encounter for long-term (current) use of insulin  (HCC)  Z79.4     3. Posterior vitreous detachment of right eye   H43.811     4. Hypertensive retinopathy of both eyes  H35.033     5. Essential hypertension  I10     6. Pseudophakia  Z96.1      1,2. Moderate Non-proliferative diabetic retinopathy, both eyes  - A1C 10.0 (02.18.25)  - Dialysis - Mon, Wed, and Friday - FA (04.22.25) shows: OU: Scattered patches of vascular non profusion, scattered MA with late leakage posteriorly, no NV - s/p IVA OD #1 (04.22.25) - BCVA OD 20/25, OS 20/20 -- stable OU  - OCT shows: OD: Persistent mild IRF/edema nasal and temp macula -- slightly improved, interval release of partial PVD; OS: Mild IRF/edema nasal and temp macula -- improved  - recommend IVA OD #2 today, 05.20.25 and follow up in 4 weeks - pt wishes to proceed - RBA of procedure discussed, questions answered - informed consent obtained and signed OU 04.22.25 - see procedure note - f/u in 4 wks -- DFE/OCT, possible injection   3. PVD / vitreous syneresis OD  - Discussed findings and prognosis  - No RT or RD on 360 scleral depressed exam  - Reviewed s/s of RT/RD  - Strict return precautions for any such RT/RD signs/symptoms  - f/u in 3-4 wks -- DFE/OCT  4,5. Hypertensive retinopathy OU - discussed importance of tight BP control - monitor   6. Pseudophakia OU  - s/p CE/IOL  - IOL in good position, doing well  - monitor   Ophthalmic Meds Ordered this visit:  Meds ordered this encounter  Medications   Bevacizumab  (AVASTIN ) SOLN 1.25 mg     Return in about 4 weeks (around 05/03/2024) for f/u NPDR OU, DFE, OCT, Possible Injxn.  There are no Patient Instructions on file for this visit.  Explained the diagnoses, plan, and follow up with the patient and they expressed understanding.  Patient expressed understanding of the importance of proper follow up care.   This document serves as a record of services personally performed by Jeanice Millard, MD, PhD. It was created on their behalf by Olene Berne, COT an ophthalmic technician. The  creation of this record is the provider's dictation and/or activities during the visit.    Electronically signed by:  Olene Berne, COT  04/10/24 12:40 AM  This document serves as a record of services personally performed by Jeanice Millard, MD, PhD. It was created on their behalf by Morley Arabia. Bevin Bucks, OA an ophthalmic technician. The creation of this record is the provider's dictation and/or activities during the visit.    Electronically signed by: Morley Arabia. Bevin Bucks, OA 04/10/24 12:40 AM  Jeanice Millard, M.D., Ph.D. Diseases & Surgery of the Retina and Vitreous [facility] 04/05/2024  I have reviewed the above documentation for accuracy and completeness, and I agree with the above. Jeanice Millard, M.D., Ph.D. 04/10/24 12:45 AM     Abbreviations: M myopia (nearsighted); A astigmatism; H hyperopia (farsighted); P presbyopia; Mrx spectacle prescription;  CTL contact lenses; OD right eye; OS left eye; OU both eyes  XT exotropia; ET esotropia; PEK punctate epithelial keratitis; PEE punctate epithelial erosions; DES dry eye syndrome; MGD meibomian gland dysfunction; ATs artificial tears; PFAT's preservative free artificial tears; NSC nuclear sclerotic cataract; PSC posterior subcapsular cataract; ERM epi-retinal membrane; PVD posterior vitreous detachment; RD retinal detachment; DM diabetes mellitus; DR diabetic retinopathy; NPDR non-proliferative diabetic retinopathy; PDR proliferative diabetic retinopathy; CSME clinically significant macular edema; DME diabetic macular edema; dbh dot blot hemorrhages; CWS cotton wool spot; POAG primary open angle glaucoma; C/D cup-to-disc ratio; HVF humphrey visual field; GVF goldmann visual field; OCT optical coherence tomography; IOP intraocular pressure; BRVO Branch retinal vein occlusion; CRVO central retinal vein occlusion; CRAO central retinal artery occlusion; BRAO branch retinal artery occlusion; RT retinal tear; SB scleral buckle;  PPV pars plana vitrectomy; VH Vitreous hemorrhage; PRP panretinal laser photocoagulation; IVK intravitreal kenalog; VMT vitreomacular traction; MH Macular hole;  NVD neovascularization of the disc; NVE neovascularization elsewhere; AREDS age related eye disease study; ARMD age related macular degeneration; POAG primary open angle glaucoma; EBMD epithelial/anterior basement membrane dystrophy; ACIOL anterior chamber intraocular lens; IOL intraocular lens; PCIOL posterior chamber intraocular lens; Phaco/IOL phacoemulsification with intraocular lens placement; PRK photorefractive keratectomy; LASIK laser assisted in situ keratomileusis; HTN hypertension; DM diabetes mellitus; COPD chronic obstructive pulmonary disease

## 2024-03-29 ENCOUNTER — Encounter (HOSPITAL_COMMUNITY): Payer: Self-pay

## 2024-04-05 ENCOUNTER — Encounter (HOSPITAL_COMMUNITY): Payer: Self-pay

## 2024-04-05 ENCOUNTER — Ambulatory Visit (INDEPENDENT_AMBULATORY_CARE_PROVIDER_SITE_OTHER): Admitting: Ophthalmology

## 2024-04-05 ENCOUNTER — Encounter (INDEPENDENT_AMBULATORY_CARE_PROVIDER_SITE_OTHER): Payer: Self-pay | Admitting: Ophthalmology

## 2024-04-05 DIAGNOSIS — Z961 Presence of intraocular lens: Secondary | ICD-10-CM

## 2024-04-05 DIAGNOSIS — H43811 Vitreous degeneration, right eye: Secondary | ICD-10-CM

## 2024-04-05 DIAGNOSIS — H35033 Hypertensive retinopathy, bilateral: Secondary | ICD-10-CM

## 2024-04-05 DIAGNOSIS — I10 Essential (primary) hypertension: Secondary | ICD-10-CM | POA: Diagnosis not present

## 2024-04-05 DIAGNOSIS — E113313 Type 2 diabetes mellitus with moderate nonproliferative diabetic retinopathy with macular edema, bilateral: Secondary | ICD-10-CM

## 2024-04-05 DIAGNOSIS — Z794 Long term (current) use of insulin: Secondary | ICD-10-CM

## 2024-04-05 MED ORDER — BEVACIZUMAB CHEMO INJECTION 1.25MG/0.05ML SYRINGE FOR KALEIDOSCOPE
1.2500 mg | INTRAVITREAL | Status: AC | PRN
  Administered 2024-04-05: 1.25 mg via INTRAVITREAL

## 2024-04-07 ENCOUNTER — Encounter (HOSPITAL_COMMUNITY)
Admission: RE | Disposition: A | Discharge status: Home / Self Care | Payer: Self-pay | Source: Home / Self Care | Attending: Vascular Surgery

## 2024-04-07 ENCOUNTER — Other Ambulatory Visit: Payer: Self-pay

## 2024-04-07 ENCOUNTER — Ambulatory Visit (HOSPITAL_COMMUNITY)
Admission: RE | Admit: 2024-04-07 | Discharge: 2024-04-07 | Disposition: A | Discharge status: Home / Self Care | Attending: Vascular Surgery | Admitting: Vascular Surgery

## 2024-04-07 DIAGNOSIS — Z992 Dependence on renal dialysis: Secondary | ICD-10-CM

## 2024-04-07 DIAGNOSIS — T82858A Stenosis of vascular prosthetic devices, implants and grafts, initial encounter: Secondary | ICD-10-CM | POA: Insufficient documentation

## 2024-04-07 DIAGNOSIS — Y828 Other medical devices associated with adverse incidents: Secondary | ICD-10-CM | POA: Diagnosis not present

## 2024-04-07 DIAGNOSIS — N186 End stage renal disease: Secondary | ICD-10-CM | POA: Diagnosis not present

## 2024-04-07 DIAGNOSIS — T82898A Other specified complication of vascular prosthetic devices, implants and grafts, initial encounter: Secondary | ICD-10-CM

## 2024-04-07 DIAGNOSIS — N183 Chronic kidney disease, stage 3 unspecified: Secondary | ICD-10-CM

## 2024-04-07 HISTORY — PX: VENOUS ANGIOPLASTY: CATH118376

## 2024-04-07 HISTORY — PX: A/V SHUNT INTERVENTION: CATH118220

## 2024-04-07 SURGERY — A/V SHUNT INTERVENTION
Anesthesia: LOCAL

## 2024-04-07 MED ORDER — HEPARIN (PORCINE) IN NACL 1000-0.9 UT/500ML-% IV SOLN
INTRAVENOUS | Status: DC | PRN
  Administered 2024-04-07: 500 mL

## 2024-04-07 MED ORDER — LIDOCAINE HCL (PF) 1 % IJ SOLN
INTRAMUSCULAR | Status: DC | PRN
  Administered 2024-04-07: 5 mL via SUBCUTANEOUS

## 2024-04-07 MED ORDER — IODIXANOL 320 MG/ML IV SOLN
INTRAVENOUS | Status: DC | PRN
  Administered 2024-04-07: 24 mL via INTRAVENOUS

## 2024-04-07 MED ORDER — HEPARIN SODIUM (PORCINE) 1000 UNIT/ML IJ SOLN
INTRAMUSCULAR | Status: AC
  Filled 2024-04-07: qty 10

## 2024-04-07 MED ORDER — MIDAZOLAM HCL 2 MG/2ML IJ SOLN
INTRAMUSCULAR | Status: AC
  Filled 2024-04-07: qty 2

## 2024-04-07 MED ORDER — HEPARIN SODIUM (PORCINE) 1000 UNIT/ML IJ SOLN
INTRAMUSCULAR | Status: DC | PRN
  Administered 2024-04-07: 3000 [IU] via INTRAVENOUS

## 2024-04-07 MED ORDER — LIDOCAINE HCL (PF) 1 % IJ SOLN
INTRAMUSCULAR | Status: AC
  Filled 2024-04-07: qty 30

## 2024-04-07 SURGICAL SUPPLY — 9 items
BALLOON MUSTANG 7.0X40 75 (BALLOONS) IMPLANT
KIT ENCORE 40 (KITS) IMPLANT
KIT MICROPUNCTURE NIT STIFF (SHEATH) IMPLANT
SHEATH PINNACLE R/O II 6F 4CM (SHEATH) IMPLANT
SHEATH PROBE COVER 6X72 (BAG) IMPLANT
STOPCOCK MORSE 400PSI 3WAY (MISCELLANEOUS) IMPLANT
TRAY PV CATH (CUSTOM PROCEDURE TRAY) ×2 IMPLANT
TUBE CONN 8.8X1320 FR HP M-F (CONNECTOR) IMPLANT
WIRE BENTSON .035X145CM (WIRE) IMPLANT

## 2024-04-07 NOTE — Op Note (Signed)
    Patient name: Tricia Huynh MRN: [id number] DOB: 05/03/1956 Sex: female  04/07/2024 Pre-operative Diagnosis: Left arm fistula malfunction Post-operative diagnosis:  Same Surgeon:  Kayla Part, MD Procedure Performed: 1.  Ultrasound-guided micropuncture access of the left arm brachiocephalic fistula 2.  Fistulogram 3.  Balloon venoplasty 7 x 40 mm brachiocephalic fistula 4.  Access manage with Monocryl suture   Indications: Patient is a 68 year old female with low flows at dialysis.  After discussing risk and benefits of left arm fistulogram in effort to define flow, and possibly treat areas of stenosis, [name] elected to proceed.  Findings:   Widely patent arterial anastomosis, 3 cm tapering stenosis roughly 5 cm from the anastomosis with greater than 70% stenosis Otherwise, fistula widely patent.  No outflow stenosis   Procedure:  The patient was identified in the holding area and taken to room 8.  The patient was then placed supine on the table and prepped and draped in the usual sterile fashion.  A time out was called.  Ultrasound was used to evaluate the left arm brachiocephalic fistula.  The patient was accessed roughly 2 cm from the anastomosis with a micropuncture needle in antegrade fashion.  This was exchanged for a micropuncture sheath.  Fistulogram followed.  I elected to treat to the flow-limiting stenosis.  The patient was heparinized and a 7 x 40 mm balloon was brought onto the field.  This was laid across the lesion and inflated for 3 minutes.  In total, 2 inflations were completed.  Follow-up angiography demonstrated excellent result with resolution of flow-limiting stenosis.  Patient had an excellent thrill in the fistula case completion.  Access was managed with Monocryl suture.  Impression: Successful balloon venoplasty of the brachiocephalic fistula with 7 mm balloon Fistula remains amenable to further intervention Can use access.   Kayla Part  MD [facility] Office: [phone]

## 2024-04-08 ENCOUNTER — Encounter (HOSPITAL_COMMUNITY): Payer: Self-pay | Admitting: Vascular Surgery

## 2024-04-14 ENCOUNTER — Ambulatory Visit
Admission: RE | Admit: 2024-04-14 | Discharge: 2024-04-14 | Disposition: A | Discharge status: Home / Self Care | Source: Ambulatory Visit | Attending: Nephrology

## 2024-04-14 DIAGNOSIS — Z1231 Encounter for screening mammogram for malignant neoplasm of breast: Secondary | ICD-10-CM

## 2024-04-20 ENCOUNTER — Other Ambulatory Visit: Payer: Self-pay | Admitting: Nephrology

## 2024-04-20 DIAGNOSIS — R928 Other abnormal and inconclusive findings on diagnostic imaging of breast: Secondary | ICD-10-CM

## 2024-04-25 NOTE — H&P (Signed)
 Patient is a 68 year old female with low flows at dialysis currently using left arm AV fistula.  On exam, she was doing well, no complaints.   She had a light thrill within the fistula.,  Pulsatility close to the anastomosis.  I was concerned for flow-limiting stenosis at the mid fistula leading to the pulsatility and subsequent issues with flows more centrally.  I had a long conversation with [name] regarding the risks and benefits of AV fistulogram in an effort to define and improve flow through the fistula so that it can be accessed without issue.  After discussing risks and benefits, she elected to proceed.  Kayla Part MD

## 2024-04-26 NOTE — Progress Notes (Shared)
 [facility] - Clinic Note  05/03/2024   CHIEF COMPLAINT Patient presents for Retina Follow Up  HISTORY OF PRESENT ILLNESS: Tricia Huynh is a 68 y.o. female who presents to the clinic today for:  HPI     Retina Follow Up           Diagnosis: Diabetic Retinopathy   Laterality: both eyes   Onset: 2 months ago   Duration: 4 weeks   Course: gradually worsening   MD Performed: performed the HPI with the patient and updated documentation appropriately         Comments   Pt states she is still noticing several floaters in the right eye. Pt denies FOL/pain. Pt is not using AT's. Pt states eye was very red after last injection for 1 day. A1c= 10 on 01/05/2024 BS= 347 right now, about 200 this am when fasting. Dialysis M,W,F      Last edited by Carrington Clack, COT on 05/03/2024 12:11 PM.     Patient states her vision has improved  Referring physician: Olin Bertin, MD [address] [place],  [place] [address]  HISTORICAL INFORMATION:  Selected notes from the MEDICAL RECORD NUMBER Referred by Dr. Alexia Angelucci at [facility] for diabetic eye exam LEE:  Ocular Hx- PMH-   CURRENT MEDICATIONS: No current outpatient medications on file. (Ophthalmic Drugs)   No current facility-administered medications for this visit. (Ophthalmic Drugs)   Current Outpatient Medications (Other)  Medication Sig   albuterol  (VENTOLIN  HFA) 108 (90 Base) MCG/ACT inhaler Inhale 1-2 puffs into the lungs every 6 (six) hours as needed for wheezing or shortness of breath.   amLODipine  (NORVASC ) 10 MG tablet Take 1 tablet (10 mg total) by mouth daily.   atorvastatin  (LIPITOR) 40 MG tablet Take 40 mg by mouth daily.   Continuous Blood Gluc Sensor (FREESTYLE LIBRE 3 SENSOR) MISC 1 each by Does not apply route 2 (two) times a week. Place 1 sensor on the skin every 14 days. Use to check glucose continuously   estradiol  (ESTRACE ) 0.1 MG/GM vaginal cream Apply 1 gram per  vagina every night for 2 weeks, then apply two times a week   gabapentin  (NEURONTIN ) 100 MG capsule Take 1 capsule (100 mg total) by mouth at bedtime.   hydrALAZINE  (APRESOLINE ) 50 MG tablet Take 50 mg by mouth 3 (three) times daily.   insulin  glargine (LANTUS  SOLOSTAR) 100 UNIT/ML Solostar Pen Inject 5 Units into the skin daily. Discard pen after 28 days from first use   Insulin  Pen Needle (TECHLITE PEN NEEDLES) 32G X 4 MM MISC Use to inject lantus  once a day   metroNIDAZOLE  (METROGEL ) 0.75 % vaginal gel Place 1 Applicatorful vaginally at bedtime. Apply one applicatorful to vagina at bedtime for 5 days   oseltamivir  (TAMIFLU ) 30 MG capsule Take 1 capsule (30 mg total) by mouth every dialysis.   carvedilol  (COREG ) 25 MG tablet Take 1 tablet (25 mg total) by mouth 2 (two) times daily with a meal.   ferrous sulfate  325 (65 FE) MG tablet Take 1 tablet (325 mg total) by mouth daily with breakfast. (Patient not taking: Reported on 05/03/2024)   Vitamin D, Ergocalciferol, (DRISDOL) 1.25 MG (50000 UNIT) CAPS capsule Take 50,000 Units by mouth every 7 (seven) days. (Patient not taking: Reported on 05/03/2024)   No current facility-administered medications for this visit. (Other)   REVIEW OF SYSTEMS: ROS   Positive for: Gastrointestinal, Neurological, Endocrine Last edited by Carrington Clack, COT  on 05/03/2024 12:01 PM.       ALLERGIES Allergies  Allergen Reactions   Percocet [Oxycodone-Acetaminophen ] Nausea And Vomiting and Other (See Comments)    Causes the patient to be shaky/unsteady, also   Vicodin [Hydrocodone-Acetaminophen ] Nausea And Vomiting and Other (See Comments)    Causes the patient to be shaky/unsteady, also   PAST MEDICAL HISTORY Past Medical History:  Diagnosis Date   Anemia of chronic kidney failure, stage 4 (severe) (HCC)    Chronic kidney disease    Diabetes mellitus type 2 in nonobese (HCC) 06/06/2015   Diabetes mellitus without complication (HCC)    Diabetic neuropathy  (HCC) 06/06/2015   Dyslipidemia 06/06/2015   Encephalopathy    Essential hypertension 06/06/2015   Hypertension    Mood disorder (HCC) 06/06/2015   Pneumonia 03/11/2017   Seizure (HCC)    sees Gracie Lav. abd eeg, on keppra    Past Surgical History:  Procedure Laterality Date   A/V SHUNT INTERVENTION N/A 04/07/2024   Procedure: A/V SHUNT INTERVENTION;  Surgeon: Kayla Part, MD;  Location: [facility];  Service: Cardiovascular;  Laterality: N/A;   ABDOMINAL HYSTERECTOMY     AV FISTULA PLACEMENT Left 02/12/2023   Procedure: LEFT ARM BRACHIOCEPHALIC ARTERIOVENOUS (AV) FISTULA CREATION;  Surgeon: Kayla Part, MD;  Location: [facility] OR;  Service: Vascular;  Laterality: Left;   EYE SURGERY Bilateral    IR FLUORO GUIDE CV LINE RIGHT  02/06/2023   IR US  GUIDE VASC ACCESS RIGHT  02/06/2023   TUBAL LIGATION     VENOUS ANGIOPLASTY  04/07/2024   Procedure: VENOUS ANGIOPLASTY;  Surgeon: Kayla Part, MD;  Location: [facility];  Service: Cardiovascular;;   FAMILY HISTORY Family History  Problem Relation Age of Onset   Diabetes Mother    Heart disease Father    Kidney disease Father    Seizures Sister    Seizures Brother    BRCA 1/2 Neg Hx    Breast cancer Neg Hx    SOCIAL HISTORY Social History   Tobacco Use   Smoking status: Former    Types: Cigarettes   Smokeless tobacco: Never  Vaping Use   Vaping status: Never Used  Substance Use Topics   Alcohol use: No   Drug use: No       OPHTHALMIC EXAM:  Base Eye Exam     Visual Acuity (Snellen - Linear)       Right Left   Dist [place] 20/20 20/20         Tonometry (Tonopen, 12:14 PM)       Right Left   Pressure 14 17         Pupils       Pupils Dark Light Shape React APD   Right PERRL 2 1 Round Brisk None   Left PERRL 2 1 Round Brisk None         Visual Fields       Left Right    Full Full         Extraocular Movement       Right Left    Full, Ortho Full, Ortho         Neuro/Psych     Oriented x3: Yes    Mood/Affect: Normal         Dilation     Both eyes: 1.0% Mydriacyl, 2.5% Phenylephrine  @ 12:16 PM           Slit Lamp and Fundus Exam     External Exam  Right Left   External Normal Normal         Slit Lamp Exam       Right Left   Lids/Lashes Dermatochalasis - upper lid Dermatochalasis - upper lid   Conjunctiva/Sclera Melanosis Melanosis   Cornea Arcus, 1+ Punctate epithelial erosions, Well healed temporal cataract wound Arcus, trace Punctate epithelial erosions, Well healed temporal cataract wound   Anterior Chamber Deep and clear Deep and clear   Iris Round and moderately dilated, No NVI Round and moderately dilated, No NVI   Lens PC IOL in good postition, 1+ Posterior capsular opacification PC IOL in good postition   Vitreous Vitreous syneresis, Posterior vitreous detachment Vitreous syneresis         Fundus Exam       Right Left   Disc Pink and sharp Pink and sharp, PPP   C/D Ratio 0.3 0.5   Macula Flat, Good foveal reflex, central cystic changes/edema -- slightly improved, scattered MA/DBH Flat, Blunted foveal reflex, central cystic changes/edema -- slightly improved, scattered MA/DBH   Vessels attenuated, Tortuous attenuated, Tortuous   Periphery Attached, posterior scattered MA/DBH, pigmented CR scar IN periphery, No RT/RD Attached, posterior scattered MA/DBH           IMAGING AND PROCEDURES  Imaging and Procedures for 05/03/2024         ASSESSMENT/PLAN:   ICD-10-CM   1. Moderate nonproliferative diabetic retinopathy of both eyes with macular edema associated with type 2 diabetes mellitus (HCC)  E11.3313 OCT, Retina - OU - Both Eyes    2. Encounter for long-term (current) use of insulin  (HCC)  Z79.4     3. Posterior vitreous detachment of right eye  H43.811     4. Hypertensive retinopathy of both eyes  H35.033     5. Essential hypertension  I10     6. Pseudophakia  Z96.1       1,2. Moderate Non-proliferative diabetic  retinopathy, both eyes  - A1C 10.0 (02.18.25)  - Dialysis - Mon, Wed, and Friday - FA (04.22.25) shows: OU: Scattered patches of vascular non profusion, scattered MA with late leakage posteriorly, no NV - s/p IVA OD #1 (04.22.25), #2 (05.20.25) - BCVA OD 20/25, OS 20/20 -- stable OU  - OCT shows: OD: Persistent mild IRF/edema nasal and temp macula -- slightly improved, interval release of partial PVD; OS: Mild IRF/edema nasal and temp macula -- improved  - recommend IVA OD #3 today, 06.17.25 and follow up in 4 weeks - pt wishes to proceed - RBA of procedure discussed, questions answered - informed consent obtained and signed OU 04.22.25 - see procedure note - f/u in 4 wks -- DFE/OCT, possible injection   3. PVD / vitreous syneresis OD  - Discussed findings and prognosis  - No RT or RD on 360 scleral depressed exam  - Reviewed s/s of RT/RD  - Strict return precautions for any such RT/RD signs/symptoms  - f/u in 3-4 wks -- DFE/OCT  4,5. Hypertensive retinopathy OU - discussed importance of tight BP control - monitor   6. Pseudophakia OU  - s/p CE/IOL  - IOL in good position, doing well  - monitor   Ophthalmic Meds Ordered this visit:  No orders of the defined types were placed in this encounter.    Return in about 4 weeks (around 05/31/2024) for f/u NPDR OU, DFE, OCT.  There are no Patient Instructions on file for this visit.  Explained the diagnoses, plan, and follow up with the patient and they  expressed understanding.  Patient expressed understanding of the importance of proper follow up care.   This document serves as a record of services personally performed by Jeanice Millard, MD, PhD. It was created on their behalf by Angelia Kelp, an ophthalmic technician. The creation of this record is the provider's dictation and/or activities during the visit.    Electronically signed by: Angelia Kelp, OA, 05/03/24  12:45 PM  This document serves as a record of services  personally performed by Jeanice Millard, MD, PhD. It was created on their behalf by Morley Arabia. Bevin Bucks, OA an ophthalmic technician. The creation of this record is the provider's dictation and/or activities during the visit.    Electronically signed by: Morley Arabia. Bevin Bucks, OA 05/03/24 12:45 PM    Jeanice Millard, M.D., Ph.D. Diseases & Surgery of the Retina and Vitreous [facility] 05/03/2024    Abbreviations: M myopia (nearsighted); A astigmatism; H hyperopia (farsighted); P presbyopia; Mrx spectacle prescription;  CTL contact lenses; OD right eye; OS left eye; OU both eyes  XT exotropia; ET esotropia; PEK punctate epithelial keratitis; PEE punctate epithelial erosions; DES dry eye syndrome; MGD meibomian gland dysfunction; ATs artificial tears; PFAT's preservative free artificial tears; NSC nuclear sclerotic cataract; PSC posterior subcapsular cataract; ERM epi-retinal membrane; PVD posterior vitreous detachment; RD retinal detachment; DM diabetes mellitus; DR diabetic retinopathy; NPDR non-proliferative diabetic retinopathy; PDR proliferative diabetic retinopathy; CSME clinically significant macular edema; DME diabetic macular edema; dbh dot blot hemorrhages; CWS cotton wool spot; POAG primary open angle glaucoma; C/D cup-to-disc ratio; HVF humphrey visual field; GVF goldmann visual field; OCT optical coherence tomography; IOP intraocular pressure; BRVO Branch retinal vein occlusion; CRVO central retinal vein occlusion; CRAO central retinal artery occlusion; BRAO branch retinal artery occlusion; RT retinal tear; SB scleral buckle; PPV pars plana vitrectomy; VH Vitreous hemorrhage; PRP panretinal laser photocoagulation; IVK intravitreal kenalog; VMT vitreomacular traction; MH Macular hole;  NVD neovascularization of the disc; NVE neovascularization elsewhere; AREDS age related eye disease study; ARMD age related macular degeneration; POAG primary open angle glaucoma; EBMD  epithelial/anterior basement membrane dystrophy; ACIOL anterior chamber intraocular lens; IOL intraocular lens; PCIOL posterior chamber intraocular lens; Phaco/IOL phacoemulsification with intraocular lens placement; PRK photorefractive keratectomy; LASIK laser assisted in situ keratomileusis; HTN hypertension; DM diabetes mellitus; COPD chronic obstructive pulmonary disease

## 2024-05-03 ENCOUNTER — Encounter (INDEPENDENT_AMBULATORY_CARE_PROVIDER_SITE_OTHER): Payer: Self-pay | Admitting: Ophthalmology

## 2024-05-03 ENCOUNTER — Ambulatory Visit (INDEPENDENT_AMBULATORY_CARE_PROVIDER_SITE_OTHER): Admitting: Ophthalmology

## 2024-05-03 DIAGNOSIS — Z794 Long term (current) use of insulin: Secondary | ICD-10-CM | POA: Diagnosis not present

## 2024-05-03 DIAGNOSIS — H43811 Vitreous degeneration, right eye: Secondary | ICD-10-CM

## 2024-05-03 DIAGNOSIS — H35033 Hypertensive retinopathy, bilateral: Secondary | ICD-10-CM | POA: Diagnosis not present

## 2024-05-03 DIAGNOSIS — I10 Essential (primary) hypertension: Secondary | ICD-10-CM

## 2024-05-03 DIAGNOSIS — Z961 Presence of intraocular lens: Secondary | ICD-10-CM

## 2024-05-03 DIAGNOSIS — E113313 Type 2 diabetes mellitus with moderate nonproliferative diabetic retinopathy with macular edema, bilateral: Secondary | ICD-10-CM

## 2024-05-03 MED ORDER — BEVACIZUMAB CHEMO INJECTION 1.25MG/0.05ML SYRINGE FOR KALEIDOSCOPE
1.2500 mg | INTRAVITREAL | Status: AC | PRN
  Administered 2024-05-03: 1.25 mg via INTRAVITREAL

## 2024-05-16 ENCOUNTER — Encounter

## 2024-05-16 ENCOUNTER — Other Ambulatory Visit

## 2024-05-17 ENCOUNTER — Ambulatory Visit
Admission: RE | Admit: 2024-05-17 | Discharge: 2024-05-17 | Disposition: A | Discharge status: Home / Self Care | Source: Ambulatory Visit | Attending: Nephrology | Admitting: Nephrology

## 2024-05-17 ENCOUNTER — Ambulatory Visit

## 2024-05-17 DIAGNOSIS — R928 Other abnormal and inconclusive findings on diagnostic imaging of breast: Secondary | ICD-10-CM

## 2024-05-22 ENCOUNTER — Emergency Department (HOSPITAL_COMMUNITY)

## 2024-05-22 ENCOUNTER — Emergency Department (HOSPITAL_COMMUNITY)
Admission: EM | Admit: 2024-05-22 | Discharge: 2024-05-22 | Disposition: A | Discharge status: Home / Self Care | Attending: Student | Admitting: Student

## 2024-05-22 ENCOUNTER — Other Ambulatory Visit: Payer: Self-pay

## 2024-05-22 ENCOUNTER — Encounter (HOSPITAL_COMMUNITY): Payer: Self-pay | Admitting: Emergency Medicine

## 2024-05-22 DIAGNOSIS — N186 End stage renal disease: Secondary | ICD-10-CM | POA: Diagnosis not present

## 2024-05-22 DIAGNOSIS — K529 Noninfective gastroenteritis and colitis, unspecified: Secondary | ICD-10-CM | POA: Diagnosis not present

## 2024-05-22 DIAGNOSIS — I120 Hypertensive chronic kidney disease with stage 5 chronic kidney disease or end stage renal disease: Secondary | ICD-10-CM | POA: Diagnosis not present

## 2024-05-22 DIAGNOSIS — Z79899 Other long term (current) drug therapy: Secondary | ICD-10-CM | POA: Diagnosis not present

## 2024-05-22 DIAGNOSIS — R109 Unspecified abdominal pain: Secondary | ICD-10-CM | POA: Diagnosis present

## 2024-05-22 DIAGNOSIS — E1022 Type 1 diabetes mellitus with diabetic chronic kidney disease: Secondary | ICD-10-CM | POA: Insufficient documentation

## 2024-05-22 DIAGNOSIS — Z992 Dependence on renal dialysis: Secondary | ICD-10-CM | POA: Diagnosis not present

## 2024-05-22 DIAGNOSIS — Z794 Long term (current) use of insulin: Secondary | ICD-10-CM | POA: Diagnosis not present

## 2024-05-22 LAB — CBC WITH DIFFERENTIAL/PLATELET
Abs Immature Granulocytes: 0.02 K/uL (ref 0.00–0.07)
Basophils Absolute: 0 K/uL (ref 0.0–0.1)
Basophils Relative: 0 %
Eosinophils Absolute: 0 K/uL (ref 0.0–0.5)
Eosinophils Relative: 0 %
HCT: 29.9 % — ABNORMAL LOW (ref 36.0–46.0)
Hemoglobin: 9.8 g/dL — ABNORMAL LOW (ref 12.0–15.0)
Immature Granulocytes: 0 %
Lymphocytes Relative: 6 %
Lymphs Abs: 0.3 K/uL — ABNORMAL LOW (ref 0.7–4.0)
MCH: 31.4 pg (ref 26.0–34.0)
MCHC: 32.8 g/dL (ref 30.0–36.0)
MCV: 95.8 fL (ref 80.0–100.0)
Monocytes Absolute: 0.3 K/uL (ref 0.1–1.0)
Monocytes Relative: 6 %
Neutro Abs: 4.3 K/uL (ref 1.7–7.7)
Neutrophils Relative %: 88 %
Platelets: 198 K/uL (ref 150–400)
RBC: 3.12 MIL/uL — ABNORMAL LOW (ref 3.87–5.11)
RDW: 14.1 % (ref 11.5–15.5)
WBC: 5 K/uL (ref 4.0–10.5)
nRBC: 0 % (ref 0.0–0.2)

## 2024-05-22 LAB — COMPREHENSIVE METABOLIC PANEL WITH GFR
ALT: 11 U/L (ref 0–44)
AST: 16 U/L (ref 15–41)
Albumin: 3.3 g/dL — ABNORMAL LOW (ref 3.5–5.0)
Alkaline Phosphatase: 77 U/L (ref 38–126)
Anion gap: 12 (ref 5–15)
BUN: 27 mg/dL — ABNORMAL HIGH (ref 8–23)
CO2: 28 mmol/L (ref 22–32)
Calcium: 8.1 mg/dL — ABNORMAL LOW (ref 8.9–10.3)
Chloride: 99 mmol/L (ref 98–111)
Creatinine, Ser: 6.68 mg/dL — ABNORMAL HIGH (ref 0.44–1.00)
GFR, Estimated: 6 mL/min — ABNORMAL LOW (ref >60)
Glucose, Bld: 223 mg/dL — ABNORMAL HIGH (ref 70–99)
Potassium: 4.1 mmol/L (ref 3.5–5.1)
Sodium: 139 mmol/L (ref 135–145)
Total Bilirubin: 0.6 mg/dL (ref 0.0–1.2)
Total Protein: 7.1 g/dL (ref 6.5–8.1)

## 2024-05-22 LAB — I-STAT CHEM 8, ED
BUN: 28 mg/dL — ABNORMAL HIGH (ref 8–23)
Calcium, Ion: 0.93 mmol/L — ABNORMAL LOW (ref 1.15–1.40)
Chloride: 98 mmol/L (ref 98–111)
Creatinine, Ser: 7 mg/dL — ABNORMAL HIGH (ref 0.44–1.00)
Glucose, Bld: 217 mg/dL — ABNORMAL HIGH (ref 70–99)
HCT: 30 % — ABNORMAL LOW (ref 36.0–46.0)
Hemoglobin: 10.2 g/dL — ABNORMAL LOW (ref 12.0–15.0)
Potassium: 4.1 mmol/L (ref 3.5–5.1)
Sodium: 140 mmol/L (ref 135–145)
TCO2: 29 mmol/L (ref 22–32)

## 2024-05-22 LAB — I-STAT CG4 LACTIC ACID, ED: Lactic Acid, Venous: 0.8 mmol/L (ref 0.5–1.9)

## 2024-05-22 LAB — LIPASE, BLOOD: Lipase: 44 U/L (ref 11–51)

## 2024-05-22 MED ORDER — ACETAMINOPHEN 500 MG PO TABS
1000.0000 mg | ORAL_TABLET | Freq: Once | ORAL | Status: AC
  Administered 2024-05-22: 1000 mg via ORAL
  Filled 2024-05-22: qty 2

## 2024-05-22 NOTE — ED Provider Notes (Signed)
 [place] EMERGENCY DEPARTMENT AT [facility] Provider Note   CSN: [id number] Arrival date & time: 05/22/24  9681     Patient presents with: Altered Mental Status   Tricia Huynh is a 68 y.o. female with hx of ESRD on HD with fistula on the L arm, HTN, IDDM, who presents via EMS with familial report of AMS. Patiennt is not accompanied by family in the ED. Reports 2 days of N/V/D, R flank pain. States she rarely makes any urine. Cognition delayed, but answering questions appropriately.      HPI     Prior to Admission medications   Medication Sig Start Date End Date Taking? Authorizing Provider  albuterol  (VENTOLIN  HFA) 108 (90 Base) MCG/ACT inhaler Inhale 1-2 puffs into the lungs every 6 (six) hours as needed for wheezing or shortness of breath. 10/26/22   Charlyn Sora, MD  amLODipine  (NORVASC ) 10 MG tablet Take 1 tablet (10 mg total) by mouth daily. 09/21/22 05/03/24  Christobal Guadalajara, MD  atorvastatin  (LIPITOR) 40 MG tablet Take 40 mg by mouth daily. 12/23/19   [provider]  carvedilol  (COREG ) 25 MG tablet Take 1 tablet (25 mg total) by mouth 2 (two) times daily with a meal. 09/21/22 10/07/23  Christobal Guadalajara, MD  Continuous Blood Gluc Sensor (FREESTYLE LIBRE 3 SENSOR) MISC 1 each by Does not apply route 2 (two) times a week. Place 1 sensor on the skin every 14 days. Use to check glucose continuously 09/22/22   Christobal Guadalajara, MD  estradiol  (ESTRACE ) 0.1 MG/GM vaginal cream Apply 1 gram per vagina every night for 2 weeks, then apply two times a week 10/07/23   Nicholaus Burnard HERO, MD  ferrous sulfate  325 (65 FE) MG tablet Take 1 tablet (325 mg total) by mouth daily with breakfast. Patient not taking: Reported on 05/03/2024 05/02/22   Sheikh, Omair Latif, DO  gabapentin  (NEURONTIN ) 100 MG capsule Take 1 capsule (100 mg total) by mouth at bedtime. 07/28/22   Danford, Lonni SQUIBB, MD  hydrALAZINE  (APRESOLINE ) 50 MG tablet Take 50 mg by mouth 3 (three) times daily.    [provider]  insulin  glargine (LANTUS  SOLOSTAR) 100 UNIT/ML Solostar Pen Inject 5 Units into the skin daily. Discard pen after 28 days from first use 02/12/23   Ghimire, Donalda HERO, MD  Insulin  Pen Needle (TECHLITE PEN NEEDLES) 32G X 4 MM MISC Use to inject lantus  once a day 07/28/22   Danford, Lonni SQUIBB, MD  metroNIDAZOLE  (METROGEL ) 0.75 % vaginal gel Place 1 Applicatorful vaginally at bedtime. Apply one applicatorful to vagina at bedtime for 5 days 10/07/23   Nicholaus Burnard HERO, MD  oseltamivir  (TAMIFLU ) 30 MG capsule Take 1 capsule (30 mg total) by mouth every dialysis. 12/08/23   Henderly, Britni A, PA-C  Vitamin D, Ergocalciferol, (DRISDOL) 1.25 MG (50000 UNIT) CAPS capsule Take 50,000 Units by mouth every 7 (seven) days. Patient not taking: Reported on 05/03/2024    [provider]    Allergies: Percocet [oxycodone-acetaminophen ] and Vicodin [hydrocodone-acetaminophen ]    Review of Systems  Gastrointestinal:  Positive for diarrhea, nausea and vomiting.  Genitourinary:  Positive for flank pain.    Updated Vital Signs BP (!) 172/84 (BP Location: Right Arm)   Pulse 92   Temp (!) 100.6 F (38.1 C) (Oral)   Resp 15   SpO2 100%   Physical Exam Vitals and nursing note reviewed.  Constitutional:      Appearance: She is not ill-appearing or toxic-appearing.  HENT:  Head: Normocephalic and atraumatic.     Mouth/Throat:     Mouth: Mucous membranes are moist.     Pharynx: No oropharyngeal exudate or posterior oropharyngeal erythema.  Eyes:     General:        Right eye: No discharge.        Left eye: No discharge.     Conjunctiva/sclera: Conjunctivae normal.  Cardiovascular:     Rate and Rhythm: Normal rate and regular rhythm.     Pulses: Normal pulses.     Heart sounds: Normal heart sounds. No murmur heard. Pulmonary:     Effort: Pulmonary effort is normal. No respiratory distress.     Breath sounds: Normal breath sounds. No wheezing or rales.  Abdominal:      General: Bowel sounds are normal. There is no distension.     Palpations: Abdomen is soft.     Tenderness: There is no abdominal tenderness. There is right CVA tenderness. There is no left CVA tenderness, guarding or rebound.  Musculoskeletal:        General: No deformity.     Cervical back: Neck supple.     Right lower leg: No edema.     Left lower leg: No edema.  Skin:    General: Skin is warm and dry.     Capillary Refill: Capillary refill takes less than 2 seconds.  Neurological:     General: No focal deficit present.     Mental Status: She is alert and oriented to person, place, and time. Mental status is at baseline.     GCS: GCS eye subscore is 4. GCS verbal subscore is 4. GCS motor subscore is 6.  Psychiatric:        Mood and Affect: Mood normal.     (all labs ordered are listed, but only abnormal results are displayed) Labs Reviewed  COMPREHENSIVE METABOLIC PANEL WITH GFR - Abnormal; Notable for the following components:      Result Value   Glucose, Bld 223 (*)    BUN 27 (*)    Creatinine, Ser 6.68 (*)    Calcium  8.1 (*)    Albumin 3.3 (*)    GFR, Estimated 6 (*)    All other components within normal limits  CBC WITH DIFFERENTIAL/PLATELET - Abnormal; Notable for the following components:   RBC 3.12 (*)    Hemoglobin 9.8 (*)    HCT 29.9 (*)    Lymphs Abs 0.3 (*)    All other components within normal limits  I-STAT CHEM 8, ED - Abnormal; Notable for the following components:   BUN 28 (*)    Creatinine, Ser 7.00 (*)    Glucose, Bld 217 (*)    Calcium , Ion 0.93 (*)    Hemoglobin 10.2 (*)    HCT 30.0 (*)    All other components within normal limits  LIPASE, BLOOD  CBG MONITORING, ED  I-STAT CG4 LACTIC ACID, ED    EKG: None  Radiology: CT ABDOMEN PELVIS WO CONTRAST Result Date: 05/22/2024 CLINICAL DATA:  Right flank pain EXAM: CT ABDOMEN AND PELVIS WITHOUT CONTRAST TECHNIQUE: Multidetector CT imaging of the abdomen and pelvis was performed following the  standard protocol without IV contrast. RADIATION DOSE REDUCTION: This exam was performed according to the departmental dose-optimization program which includes automated exposure control, adjustment of the mA and/or kV according to patient size and/or use of iterative reconstruction technique. COMPARISON:  01/03/2024 FINDINGS: Lower chest: No acute abnormality. Hepatobiliary: No focal liver abnormality is seen. No gallstones,  gallbladder wall thickening, or biliary dilatation. Pancreas: Unremarkable. No pancreatic ductal dilatation or surrounding inflammatory changes. Spleen: Normal in size without focal abnormality. Adrenals/Urinary Tract: Normal adrenal glands. No nephrolithiasis or obstructive uropathy. Multiple subcentimeter, too small to characterize kidney lesions. These are compatible with Bosniak class 2 lesions require no further follow-up. Within the right kidney there are several intermediate to increased density lesions. The largest is in the lower pole measuring 1.7 cm and 53 Hounsfield units. These are unchanged compared with the prior exam. Urinary bladder appears normal. Stomach/Bowel: Assessment for bowel pathology is diminished due to lack of IV contrast. There is mild soft tissue stranding and trace fluid noted within the left hemiabdomen surrounding the left colon and proximal small bowel loops. Stomach is within normal limits. No pathologic dilatation of the large or small bowel loops. The appendix is visualized and appears normal. There is moderate wall thickening involving the ascending colon and mild wall thickening involving the transverse colon. Vascular/Lymphatic: Aortic atherosclerosis. Edema noted within the jejunal mesentery. No enlarged abdominal or pelvic lymph nodes. Reproductive: Status post hysterectomy. No adnexal masses. Other: Trace fluid noted along the left pericolic gutter and in the right posterior pelvis. No focal fluid collections identified. Musculoskeletal: No acute or  significant osseous findings. IMPRESSION: 1. No evidence for nephrolithiasis or obstructive uropathy. 2. There is moderate wall thickening involving the ascending colon and mild wall thickening involving the transverse colon. There is mild soft tissue stranding and trace fluid noted within the left hemiabdomen surrounding the left colon and proximal small bowel loops. Correlate for any clinical signs or symptoms of enterocolitis. 3. There are several intermediate to increased density lesions within the right kidney. These are unchanged compared with the prior exam. These are favored to represent hemorrhagic or proteinaceous cysts. Consider further evaluation with nonemergent renal ultrasound. 4.  Aortic Atherosclerosis (ICD10-I70.0). Electronically Signed   By: Waddell Calk M.D.   On: 05/22/2024 05:09   DG Chest Port 1 View Result Date: 05/22/2024 CLINICAL DATA:  aloc EXAM: PORTABLE CHEST 1 VIEW COMPARISON:  Chest x-ray 12/08/2023 FINDINGS: The heart and mediastinal contours are unchanged. Atherosclerotic plaque. No focal consolidation. No pulmonary edema. No pleural effusion. No pneumothorax. No acute osseous abnormality. IMPRESSION: 1. No active disease. 2.  Aortic Atherosclerosis (ICD10-I70.0). Electronically Signed   By: Morgane  Naveau M.D.   On: 05/22/2024 03:42     Procedures   Medications Ordered in the ED  acetaminophen  (TYLENOL ) tablet 1,000 mg (1,000 mg Oral Given 05/22/24 0625)    Clinical Course as of 05/22/24 0630  Sun May 22, 2024  0605 Attempted to contact family at all 4 numbers listed in the patient's chart, unsuccessfully.  [RS]    Clinical Course User Index [RS] Bobette Pleasant SAUNDERS, PA-C                                 Medical Decision Making 68 year old female presents with concern for altered mental status, right flank pain, nausea vomiting diarrhea.  HTN borderline temp on intake, VS otherwise normal. Cardiopulmonary exam is reassuring. Abdominal exam with R flank  pain.   The differential diagnosis for AMS is extensive and includes, but is not limited to:  Drug overdose - opioids, alcohol, sedatives, antipsychotics, drug withdrawal, others Metabolic: hypoxia, hypoglycemia, hyperglycemia, hypercalcemia, hypernatremia, hyponatremia, uremia, hepatic encephalopathy, hypothyroidism, hyperthyroidism, vitamin B12 or thiamine deficiency, carbon monoxide poisoning, Wilson's disease, Lactic acidosis, DKA/HHOS Infectious: meningitis, encephalitis, bacteremia/sepsis, urinary  tract infection, pneumonia, neurosyphilis Structural: Space-occupying lesion, (brain tumor, subdural hematoma, hydrocephalus,) Vascular: stroke, subarachnoid hemorrhage, coronary ischemia, hypertensive encephalopathy, CNS vasculitis, thrombotic thrombocytopenic purpura, disseminated intravascular coagulation, hyperviscosity Psychiatric: Schizophrenia, depression; Other: Seizure, hypothermia, heat stroke, ICU psychosis, dementia -sundowning.    Amount and/or Complexity of Data Reviewed Labs: ordered.    Details: CBC with anemia hemoglobin of 9.8 at patient's baseline.  CMP with patient's baseline renal function and normal electrolytes and hepatic function.  Lactic is normal.  Radiology: ordered.    Details:  Chest x-ray is negative for acute cardiopulmonary disease, CT consistent with enterocolitis, unchanged renal lesions. ECG/medicine tests:     Details:   EKG with sinus rhythm.    Risk OTC drugs.   Clinical picture most consistent with acute viral gastroenteritis.  Recommend close outpatient follow-up with her PCP, oral rehydration therapy, will discharge with prescription for as needed Zofran .  Clinical concern for emergent underlying condition of morbidity where per patient management is exceedingly low.  ED RN Jorene able to contact patient's daughter Tricia Huynh via phone who will present to the ED for pickup of the patient.  Tricia Huynh voiced understanding of her medical evaluation and  treatment plan. Each of their questions answered to their expressed satisfaction.  Return precautions were given.  Patient is well-appearing, stable, and was discharged in good condition.  This chart was dictated using voice recognition software, Dragon. Despite the best efforts of this provider to proofread and correct errors, errors may still occur which can change documentation meaning.      Final diagnoses:  Gastroenteritis    ED Discharge Orders     None          Bobette Pleasant JONELLE DEVONNA 05/22/24 0630    Kommor, Lum, MD 05/22/24 [phone]

## 2024-05-22 NOTE — ED Triage Notes (Signed)
 Per family pt has been more altered then normal. Pt having fatigue. Pt is dialysis and has not missed any sessions. Pt also having n/v/d

## 2024-05-22 NOTE — Discharge Instructions (Addendum)
 You were seen in the ER today for your nausea vomiting diarrhea.  Your blood work was very reassuring, as well as your CT scan.  You likely have a viral illness causing your symptoms.  May continue to use hydrate at home, you may incorporate electrolyte segmentation such as Pedialyte at home.  Please follow-up closely with primary care doctor and continue to follow-up with dialysis as scheduled.  Return to the ER with new severe symptoms.

## 2024-05-27 NOTE — Progress Notes (Shared)
 [facility] - Clinic Note  05/31/2024   CHIEF COMPLAINT Patient presents for No chief complaint on file.  HISTORY OF PRESENT ILLNESS: Tricia Huynh is a 68 y.o. female who presents to the clinic today for:    Patient states her vision has improved  Referring physician: Verena Mems, MD [address] [place],  [place] [address]  HISTORICAL INFORMATION:  Selected notes from the MEDICAL RECORD NUMBER Referred by Dr. Verena at [facility] for diabetic eye exam LEE:  Ocular Hx- PMH-   CURRENT MEDICATIONS: No current outpatient medications on file. (Ophthalmic Drugs)   No current facility-administered medications for this visit. (Ophthalmic Drugs)   Current Outpatient Medications (Other)  Medication Sig   albuterol  (VENTOLIN  HFA) 108 (90 Base) MCG/ACT inhaler Inhale 1-2 puffs into the lungs every 6 (six) hours as needed for wheezing or shortness of breath.   amLODipine  (NORVASC ) 10 MG tablet Take 1 tablet (10 mg total) by mouth daily.   atorvastatin  (LIPITOR) 40 MG tablet Take 40 mg by mouth daily.   carvedilol  (COREG ) 25 MG tablet Take 1 tablet (25 mg total) by mouth 2 (two) times daily with a meal.   Continuous Blood Gluc Sensor (FREESTYLE LIBRE 3 SENSOR) MISC 1 each by Does not apply route 2 (two) times a week. Place 1 sensor on the skin every 14 days. Use to check glucose continuously   estradiol  (ESTRACE ) 0.1 MG/GM vaginal cream Apply 1 gram per vagina every night for 2 weeks, then apply two times a week   ferrous sulfate  325 (65 FE) MG tablet Take 1 tablet (325 mg total) by mouth daily with breakfast. (Patient not taking: Reported on 05/03/2024)   gabapentin  (NEURONTIN ) 100 MG capsule Take 1 capsule (100 mg total) by mouth at bedtime.   hydrALAZINE  (APRESOLINE ) 50 MG tablet Take 50 mg by mouth 3 (three) times daily.   insulin  glargine (LANTUS  SOLOSTAR) 100 UNIT/ML Solostar Pen Inject 5 Units into the skin daily. Discard pen after 28 days  from first use   Insulin  Pen Needle (TECHLITE PEN NEEDLES) 32G X 4 MM MISC Use to inject lantus  once a day   metroNIDAZOLE  (METROGEL ) 0.75 % vaginal gel Place 1 Applicatorful vaginally at bedtime. Apply one applicatorful to vagina at bedtime for 5 days   oseltamivir  (TAMIFLU ) 30 MG capsule Take 1 capsule (30 mg total) by mouth every dialysis.   Vitamin D, Ergocalciferol, (DRISDOL) 1.25 MG (50000 UNIT) CAPS capsule Take 50,000 Units by mouth every 7 (seven) days. (Patient not taking: Reported on 05/03/2024)   No current facility-administered medications for this visit. (Other)   REVIEW OF SYSTEMS:   ALLERGIES Allergies  Allergen Reactions   Percocet [Oxycodone-Acetaminophen ] Nausea And Vomiting and Other (See Comments)    Causes the patient to be shaky/unsteady, also   Vicodin [Hydrocodone-Acetaminophen ] Nausea And Vomiting and Other (See Comments)    Causes the patient to be shaky/unsteady, also   PAST MEDICAL HISTORY Past Medical History:  Diagnosis Date   Anemia of chronic kidney failure, stage 4 (severe) (HCC)    Chronic kidney disease    Diabetes mellitus type 2 in nonobese (HCC) 06/06/2015   Diabetes mellitus without complication (HCC)    Diabetic neuropathy (HCC) 06/06/2015   Dyslipidemia 06/06/2015   Encephalopathy    Essential hypertension 06/06/2015   Hypertension    Mood disorder (HCC) 06/06/2015   Pneumonia 03/11/2017   Seizure (HCC)    sees On[facility]. abd eeg, on keppra    Past Surgical  History:  Procedure Laterality Date   A/V SHUNT INTERVENTION N/A 04/07/2024   Procedure: A/V SHUNT INTERVENTION;  Surgeon: Lanis Fonda BRAVO, MD;  Location: [facility];  Service: Cardiovascular;  Laterality: N/A;   ABDOMINAL HYSTERECTOMY     AV FISTULA PLACEMENT Left 02/12/2023   Procedure: LEFT ARM BRACHIOCEPHALIC ARTERIOVENOUS (AV) FISTULA CREATION;  Surgeon: Lanis Fonda BRAVO, MD;  Location: [facility] OR;  Service: Vascular;  Laterality: Left;   EYE SURGERY Bilateral    IR FLUORO GUIDE CV LINE  RIGHT  02/06/2023   IR US  GUIDE VASC ACCESS RIGHT  02/06/2023   TUBAL LIGATION     VENOUS ANGIOPLASTY  04/07/2024   Procedure: VENOUS ANGIOPLASTY;  Surgeon: Lanis Fonda BRAVO, MD;  Location: [facility];  Service: Cardiovascular;;   FAMILY HISTORY Family History  Problem Relation Age of Onset   Diabetes Mother    Heart disease Father    Kidney disease Father    Seizures Sister    Seizures Brother    BRCA 1/2 Neg Hx    Breast cancer Neg Hx    SOCIAL HISTORY Social History   Tobacco Use   Smoking status: Former    Types: Cigarettes   Smokeless tobacco: Never  Vaping Use   Vaping status: Never Used  Substance Use Topics   Alcohol use: No   Drug use: No       OPHTHALMIC EXAM:  Not recorded    IMAGING AND PROCEDURES  Imaging and Procedures for 05/31/2024         ASSESSMENT/PLAN: No diagnosis found.  1,2. Moderate Non-proliferative diabetic retinopathy, both eyes  - A1C 10.0 (02.18.25)  - Dialysis - Mon, Wed, and Friday - FA (04.22.25) shows: OU: Scattered patches of vascular non profusion, scattered MA with late leakage posteriorly, no NV - s/p IVA OD #1 (04.22.25), #2 (05.20.25), #3 (06.17.25) - BCVA OD 20/20 from 20/25, OS 20/20 -- stable OU  - OCT shows: OD: Persistent mild IRF/edema nasal and temp macula -- slightly improved, interval release of partial PVD; OS: Mild IRF/edema nasal and temp macula -- improved  - recommend IVA OD #4 today, 07.15.25 and follow up in 4 weeks - pt wishes to proceed - RBA of procedure discussed, questions answered - informed consent obtained and signed OU 04.22.25 - see procedure note - f/u in 4 wks -- DFE/OCT, possible injection   3. PVD / vitreous syneresis OD  - Discussed findings and prognosis  - No RT or RD on 360 scleral depressed exam  - Reviewed s/s of RT/RD  - Strict return precautions for any such RT/RD signs/symptoms  - f/u in 3-4 weeks DFE, OCT  4,5. Hypertensive retinopathy OU - discussed importance of tight BP  control - monitor  6. Pseudophakia OU  - s/p CE/IOL  - IOL in good position, doing well  - monitor  Ophthalmic Meds Ordered this visit:  No orders of the defined types were placed in this encounter.    No follow-ups on file.  There are no Patient Instructions on file for this visit.  Explained the diagnoses, plan, and follow up with the patient and they expressed understanding.  Patient expressed understanding of the importance of proper follow up care.   This document serves as a record of services personally performed by Redell JUDITHANN Hans, MD, PhD. It was created on their behalf by Auston Muzzy, COMT. The creation of this record is the provider's dictation and/or activities during the visit.  Electronically signed by: Auston Muzzy, COMT  05/27/24 9:47 AM    Redell JUDITHANN Hans, M.D., Ph.D. Diseases & Surgery of the Retina and Vitreous [facility]   Abbreviations: M myopia (nearsighted); A astigmatism; H hyperopia (farsighted); P presbyopia; Mrx spectacle prescription;  CTL contact lenses; OD right eye; OS left eye; OU both eyes  XT exotropia; ET esotropia; PEK punctate epithelial keratitis; PEE punctate epithelial erosions; DES dry eye syndrome; MGD meibomian gland dysfunction; ATs artificial tears; PFAT's preservative free artificial tears; NSC nuclear sclerotic cataract; PSC posterior subcapsular cataract; ERM epi-retinal membrane; PVD posterior vitreous detachment; RD retinal detachment; DM diabetes mellitus; DR diabetic retinopathy; NPDR non-proliferative diabetic retinopathy; PDR proliferative diabetic retinopathy; CSME clinically significant macular edema; DME diabetic macular edema; dbh dot blot hemorrhages; CWS cotton wool spot; POAG primary open angle glaucoma; C/D cup-to-disc ratio; HVF humphrey visual field; GVF goldmann visual field; OCT optical coherence tomography; IOP intraocular pressure; BRVO Branch retinal vein occlusion; CRVO central retinal vein  occlusion; CRAO central retinal artery occlusion; BRAO branch retinal artery occlusion; RT retinal tear; SB scleral buckle; PPV pars plana vitrectomy; VH Vitreous hemorrhage; PRP panretinal laser photocoagulation; IVK intravitreal kenalog; VMT vitreomacular traction; MH Macular hole;  NVD neovascularization of the disc; NVE neovascularization elsewhere; AREDS age related eye disease study; ARMD age related macular degeneration; POAG primary open angle glaucoma; EBMD epithelial/anterior basement membrane dystrophy; ACIOL anterior chamber intraocular lens; IOL intraocular lens; PCIOL posterior chamber intraocular lens; Phaco/IOL phacoemulsification with intraocular lens placement; PRK photorefractive keratectomy; LASIK laser assisted in situ keratomileusis; HTN hypertension; DM diabetes mellitus; COPD chronic obstructive pulmonary disease

## 2024-05-30 ENCOUNTER — Other Ambulatory Visit: Payer: Self-pay | Admitting: Physician Assistant

## 2024-05-30 DIAGNOSIS — I739 Peripheral vascular disease, unspecified: Secondary | ICD-10-CM

## 2024-05-31 ENCOUNTER — Encounter (INDEPENDENT_AMBULATORY_CARE_PROVIDER_SITE_OTHER): Admitting: Ophthalmology

## 2024-06-09 ENCOUNTER — Other Ambulatory Visit

## 2024-06-13 NOTE — Progress Notes (Shared)
 [facility] - Clinic Note  06/21/2024   CHIEF COMPLAINT Patient presents for No chief complaint on file.  HISTORY OF PRESENT ILLNESS: Tricia Huynh is a 68 y.o. female who presents to the clinic today for:    Patient states her vision has improved  Referring physician: Verena Mems, MD [address] [place],  [place] [address]  HISTORICAL INFORMATION:  Selected notes from the MEDICAL RECORD NUMBER Referred by Dr. Verena at [facility] for diabetic eye exam LEE:  Ocular Hx- PMH-   CURRENT MEDICATIONS: No current outpatient medications on file. (Ophthalmic Drugs)   No current facility-administered medications for this visit. (Ophthalmic Drugs)   Current Outpatient Medications (Other)  Medication Sig   albuterol  (VENTOLIN  HFA) 108 (90 Base) MCG/ACT inhaler Inhale 1-2 puffs into the lungs every 6 (six) hours as needed for wheezing or shortness of breath.   amLODipine  (NORVASC ) 10 MG tablet Take 1 tablet (10 mg total) by mouth daily.   atorvastatin  (LIPITOR) 40 MG tablet Take 40 mg by mouth daily.   carvedilol  (COREG ) 25 MG tablet Take 1 tablet (25 mg total) by mouth 2 (two) times daily with a meal.   Continuous Blood Gluc Sensor (FREESTYLE LIBRE 3 SENSOR) MISC 1 each by Does not apply route 2 (two) times a week. Place 1 sensor on the skin every 14 days. Use to check glucose continuously   estradiol  (ESTRACE ) 0.1 MG/GM vaginal cream Apply 1 gram per vagina every night for 2 weeks, then apply two times a week   ferrous sulfate  325 (65 FE) MG tablet Take 1 tablet (325 mg total) by mouth daily with breakfast. (Patient not taking: Reported on 05/03/2024)   gabapentin  (NEURONTIN ) 100 MG capsule Take 1 capsule (100 mg total) by mouth at bedtime.   hydrALAZINE  (APRESOLINE ) 50 MG tablet Take 50 mg by mouth 3 (three) times daily.   insulin  glargine (LANTUS  SOLOSTAR) 100 UNIT/ML Solostar Pen Inject 5 Units into the skin daily. Discard pen after 28 days  from first use   Insulin  Pen Needle (TECHLITE PEN NEEDLES) 32G X 4 MM MISC Use to inject lantus  once a day   metroNIDAZOLE  (METROGEL ) 0.75 % vaginal gel Place 1 Applicatorful vaginally at bedtime. Apply one applicatorful to vagina at bedtime for 5 days   oseltamivir  (TAMIFLU ) 30 MG capsule Take 1 capsule (30 mg total) by mouth every dialysis.   Vitamin D, Ergocalciferol, (DRISDOL) 1.25 MG (50000 UNIT) CAPS capsule Take 50,000 Units by mouth every 7 (seven) days. (Patient not taking: Reported on 05/03/2024)   No current facility-administered medications for this visit. (Other)   REVIEW OF SYSTEMS:   ALLERGIES Allergies  Allergen Reactions   Percocet [Oxycodone-Acetaminophen ] Nausea And Vomiting and Other (See Comments)    Causes the patient to be shaky/unsteady, also   Vicodin [Hydrocodone-Acetaminophen ] Nausea And Vomiting and Other (See Comments)    Causes the patient to be shaky/unsteady, also   PAST MEDICAL HISTORY Past Medical History:  Diagnosis Date   Anemia of chronic kidney failure, stage 4 (severe) (HCC)    Chronic kidney disease    Diabetes mellitus type 2 in nonobese (HCC) 06/06/2015   Diabetes mellitus without complication (HCC)    Diabetic neuropathy (HCC) 06/06/2015   Dyslipidemia 06/06/2015   Encephalopathy    Essential hypertension 06/06/2015   Hypertension    Mood disorder (HCC) 06/06/2015   Pneumonia 03/11/2017   Seizure (HCC)    sees On[facility]. abd eeg, on keppra    Past Surgical  History:  Procedure Laterality Date   A/V SHUNT INTERVENTION N/A 04/07/2024   Procedure: A/V SHUNT INTERVENTION;  Surgeon: Lanis Fonda BRAVO, MD;  Location: [facility];  Service: Cardiovascular;  Laterality: N/A;   ABDOMINAL HYSTERECTOMY     AV FISTULA PLACEMENT Left 02/12/2023   Procedure: LEFT ARM BRACHIOCEPHALIC ARTERIOVENOUS (AV) FISTULA CREATION;  Surgeon: Lanis Fonda BRAVO, MD;  Location: [facility] OR;  Service: Vascular;  Laterality: Left;   EYE SURGERY Bilateral    IR FLUORO GUIDE CV LINE  RIGHT  02/06/2023   IR US  GUIDE VASC ACCESS RIGHT  02/06/2023   TUBAL LIGATION     VENOUS ANGIOPLASTY  04/07/2024   Procedure: VENOUS ANGIOPLASTY;  Surgeon: Lanis Fonda BRAVO, MD;  Location: [facility];  Service: Cardiovascular;;   FAMILY HISTORY Family History  Problem Relation Age of Onset   Diabetes Mother    Heart disease Father    Kidney disease Father    Seizures Sister    Seizures Brother    BRCA 1/2 Neg Hx    Breast cancer Neg Hx    SOCIAL HISTORY Social History   Tobacco Use   Smoking status: Former    Types: Cigarettes   Smokeless tobacco: Never  Vaping Use   Vaping status: Never Used  Substance Use Topics   Alcohol use: No   Drug use: No       OPHTHALMIC EXAM:  Not recorded    IMAGING AND PROCEDURES  Imaging and Procedures for 06/21/2024         ASSESSMENT/PLAN:   ICD-10-CM   1. Moderate nonproliferative diabetic retinopathy of both eyes with macular edema associated with type 2 diabetes mellitus (HCC)  Z88.6686     2. Encounter for long-term (current) use of insulin  (HCC)  Z79.4     3. Posterior vitreous detachment of right eye  H43.811     4. Hypertensive retinopathy of both eyes  H35.033     5. Essential hypertension  I10     6. Pseudophakia  Z96.1       1,2. Moderate Non-proliferative diabetic retinopathy, both eyes  - A1C 10.0 (02.18.25)  - Dialysis - Mon, Wed, and Friday - FA (04.22.25) shows: OU: Scattered patches of vascular non profusion, scattered MA with late leakage posteriorly, no NV - s/p IVA OD #1 (04.22.25), #2 (05.20.25), #3 (06.17.25) - BCVA OD 20/20 from 20/25, OS 20/20 -- stable OU  - OCT shows: OD: Persistent mild IRF/edema nasal and temp macula -- slightly improved, interval release of partial PVD; OS: Mild IRF/edema nasal and temp macula -- improved  - recommend IVA OD #4 today, 08.05.25 and follow up in 4 weeks - pt wishes to proceed - RBA of procedure discussed, questions answered - informed consent obtained and  signed OU 04.22.25 - see procedure note - f/u in 4 wks -- DFE/OCT, possible injection   3. PVD / vitreous syneresis OD  - Discussed findings and prognosis  - No RT or RD on 360 scleral depressed exam  - Reviewed s/s of RT/RD  - Strict return precautions for any such RT/RD signs/symptoms  - f/u in 3-4 weeks DFE, OCT  4,5. Hypertensive retinopathy OU - discussed importance of tight BP control - monitor  6. Pseudophakia OU  - s/p CE/IOL  - IOL in good position, doing well  - monitor  Ophthalmic Meds Ordered this visit:  No orders of the defined types were placed in this encounter.    No follow-ups on file.  There are  no Patient Instructions on file for this visit.  Explained the diagnoses, plan, and follow up with the patient and they expressed understanding.  Patient expressed understanding of the importance of proper follow up care.   This document serves as a record of services personally performed by Redell JUDITHANN Hans, MD, PhD. It was created on their behalf by Avelina Pereyra, COA an ophthalmic technician. The creation of this record is the provider's dictation and/or activities during the visit.   Electronically signed by: Avelina GORMAN Pereyra, COT  06/13/24  3:19 PM     Redell JUDITHANN Hans, M.D., Ph.D. Diseases & Surgery of the Retina and Vitreous [facility]   Abbreviations: M myopia (nearsighted); A astigmatism; H hyperopia (farsighted); P presbyopia; Mrx spectacle prescription;  CTL contact lenses; OD right eye; OS left eye; OU both eyes  XT exotropia; ET esotropia; PEK punctate epithelial keratitis; PEE punctate epithelial erosions; DES dry eye syndrome; MGD meibomian gland dysfunction; ATs artificial tears; PFAT's preservative free artificial tears; NSC nuclear sclerotic cataract; PSC posterior subcapsular cataract; ERM epi-retinal membrane; PVD posterior vitreous detachment; RD retinal detachment; DM diabetes mellitus; DR diabetic retinopathy; NPDR  non-proliferative diabetic retinopathy; PDR proliferative diabetic retinopathy; CSME clinically significant macular edema; DME diabetic macular edema; dbh dot blot hemorrhages; CWS cotton wool spot; POAG primary open angle glaucoma; C/D cup-to-disc ratio; HVF humphrey visual field; GVF goldmann visual field; OCT optical coherence tomography; IOP intraocular pressure; BRVO Branch retinal vein occlusion; CRVO central retinal vein occlusion; CRAO central retinal artery occlusion; BRAO branch retinal artery occlusion; RT retinal tear; SB scleral buckle; PPV pars plana vitrectomy; VH Vitreous hemorrhage; PRP panretinal laser photocoagulation; IVK intravitreal kenalog; VMT vitreomacular traction; MH Macular hole;  NVD neovascularization of the disc; NVE neovascularization elsewhere; AREDS age related eye disease study; ARMD age related macular degeneration; POAG primary open angle glaucoma; EBMD epithelial/anterior basement membrane dystrophy; ACIOL anterior chamber intraocular lens; IOL intraocular lens; PCIOL posterior chamber intraocular lens; Phaco/IOL phacoemulsification with intraocular lens placement; PRK photorefractive keratectomy; LASIK laser assisted in situ keratomileusis; HTN hypertension; DM diabetes mellitus; COPD chronic obstructive pulmonary disease

## 2024-06-16 ENCOUNTER — Other Ambulatory Visit

## 2024-06-21 ENCOUNTER — Encounter (INDEPENDENT_AMBULATORY_CARE_PROVIDER_SITE_OTHER): Admitting: Ophthalmology

## 2024-06-21 ENCOUNTER — Other Ambulatory Visit: Payer: 59

## 2024-06-21 DIAGNOSIS — I10 Essential (primary) hypertension: Secondary | ICD-10-CM

## 2024-06-21 DIAGNOSIS — Z794 Long term (current) use of insulin: Secondary | ICD-10-CM

## 2024-06-21 DIAGNOSIS — H35033 Hypertensive retinopathy, bilateral: Secondary | ICD-10-CM

## 2024-06-21 DIAGNOSIS — E113313 Type 2 diabetes mellitus with moderate nonproliferative diabetic retinopathy with macular edema, bilateral: Secondary | ICD-10-CM

## 2024-06-21 DIAGNOSIS — Z961 Presence of intraocular lens: Secondary | ICD-10-CM

## 2024-06-21 DIAGNOSIS — H43811 Vitreous degeneration, right eye: Secondary | ICD-10-CM

## 2024-09-13 ENCOUNTER — Emergency Department (HOSPITAL_COMMUNITY)

## 2024-09-13 ENCOUNTER — Other Ambulatory Visit: Payer: Self-pay

## 2024-09-13 ENCOUNTER — Emergency Department (HOSPITAL_COMMUNITY)
Admission: EM | Admit: 2024-09-13 | Discharge: 2024-09-13 | Disposition: A | Discharge status: Home / Self Care | Attending: Emergency Medicine | Admitting: Emergency Medicine

## 2024-09-13 ENCOUNTER — Encounter (HOSPITAL_COMMUNITY): Payer: Self-pay

## 2024-09-13 ENCOUNTER — Encounter (HOSPITAL_COMMUNITY): Payer: Self-pay | Admitting: Emergency Medicine

## 2024-09-13 DIAGNOSIS — E1122 Type 2 diabetes mellitus with diabetic chronic kidney disease: Secondary | ICD-10-CM | POA: Insufficient documentation

## 2024-09-13 DIAGNOSIS — Z794 Long term (current) use of insulin: Secondary | ICD-10-CM | POA: Insufficient documentation

## 2024-09-13 DIAGNOSIS — I132 Hypertensive heart and chronic kidney disease with heart failure and with stage 5 chronic kidney disease, or end stage renal disease: Secondary | ICD-10-CM | POA: Diagnosis not present

## 2024-09-13 DIAGNOSIS — E1140 Type 2 diabetes mellitus with diabetic neuropathy, unspecified: Secondary | ICD-10-CM | POA: Diagnosis not present

## 2024-09-13 DIAGNOSIS — R55 Syncope and collapse: Secondary | ICD-10-CM | POA: Diagnosis present

## 2024-09-13 DIAGNOSIS — Z79899 Other long term (current) drug therapy: Secondary | ICD-10-CM | POA: Insufficient documentation

## 2024-09-13 DIAGNOSIS — Z992 Dependence on renal dialysis: Secondary | ICD-10-CM | POA: Diagnosis not present

## 2024-09-13 DIAGNOSIS — I509 Heart failure, unspecified: Secondary | ICD-10-CM | POA: Insufficient documentation

## 2024-09-13 DIAGNOSIS — N186 End stage renal disease: Secondary | ICD-10-CM | POA: Diagnosis not present

## 2024-09-13 LAB — URINALYSIS, W/ REFLEX TO CULTURE (INFECTION SUSPECTED)
Bilirubin Urine: NEGATIVE
Glucose, UA: >=500 mg/dL — AB
Hgb urine dipstick: NEGATIVE
Ketones, ur: NEGATIVE mg/dL
Leukocytes,Ua: NEGATIVE
Nitrite: NEGATIVE
Protein, ur: 100 mg/dL — AB
Specific Gravity, Urine: 1.008 (ref 1.005–1.030)
pH: 8 (ref 5.0–8.0)

## 2024-09-13 LAB — CBC
HCT: 36.4 % (ref 36.0–46.0)
Hemoglobin: 11.8 g/dL — ABNORMAL LOW (ref 12.0–15.0)
MCH: 30.8 pg (ref 26.0–34.0)
MCHC: 32.4 g/dL (ref 30.0–36.0)
MCV: 95 fL (ref 80.0–100.0)
Platelets: 219 K/uL (ref 150–400)
RBC: 3.83 MIL/uL — ABNORMAL LOW (ref 3.87–5.11)
RDW: 14.2 % (ref 11.5–15.5)
WBC: 4.2 K/uL (ref 4.0–10.5)
nRBC: 0 % (ref 0.0–0.2)

## 2024-09-13 LAB — COMPREHENSIVE METABOLIC PANEL WITH GFR
ALT: 14 U/L (ref 0–44)
AST: 19 U/L (ref 15–41)
Albumin: 3.7 g/dL (ref 3.5–5.0)
Alkaline Phosphatase: 131 U/L — ABNORMAL HIGH (ref 38–126)
Anion gap: 14 (ref 5–15)
BUN: 20 mg/dL (ref 8–23)
CO2: 31 mmol/L (ref 22–32)
Calcium: 8.1 mg/dL — ABNORMAL LOW (ref 8.9–10.3)
Chloride: 93 mmol/L — ABNORMAL LOW (ref 98–111)
Creatinine, Ser: 4.74 mg/dL — ABNORMAL HIGH (ref 0.44–1.00)
GFR, Estimated: 9 mL/min — ABNORMAL LOW (ref >60)
Glucose, Bld: 213 mg/dL — ABNORMAL HIGH (ref 70–99)
Potassium: 3.8 mmol/L (ref 3.5–5.1)
Sodium: 138 mmol/L (ref 135–145)
Total Bilirubin: 0.5 mg/dL (ref 0.0–1.2)
Total Protein: 8.2 g/dL — ABNORMAL HIGH (ref 6.5–8.1)

## 2024-09-13 LAB — CBG MONITORING, ED: Glucose-Capillary: 234 mg/dL — ABNORMAL HIGH (ref 70–99)

## 2024-09-13 NOTE — Discharge Instructions (Addendum)
 Work-up today was reassuring.  No traumatic injuries, labs reassuring, no signs of urine infection. Make sure to rest and take it easy today. Follow-up with your doctor if any ongoing issues. Return here for new concerns.

## 2024-09-13 NOTE — ED Notes (Signed)
 Waiting for ride
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
## 2024-10-20 ENCOUNTER — Other Ambulatory Visit: Payer: Self-pay | Admitting: Gastroenterology

## 2024-10-20 DIAGNOSIS — R110 Nausea: Secondary | ICD-10-CM

## 2024-11-03 ENCOUNTER — Other Ambulatory Visit
